# Patient Record
Sex: Female | Born: 1935 | Race: White | Hispanic: No | Smoking: Former smoker
Health system: Southern US, Community
[De-identification: ages and names within clinical notes are randomized; demographics above are authoritative.]

## PROBLEM LIST (undated history)

## (undated) DIAGNOSIS — M1990 Unspecified osteoarthritis, unspecified site: Secondary | ICD-10-CM

## (undated) DIAGNOSIS — Z87448 Personal history of other diseases of urinary system: Secondary | ICD-10-CM

## (undated) DIAGNOSIS — F419 Anxiety disorder, unspecified: Secondary | ICD-10-CM

## (undated) DIAGNOSIS — Z8739 Personal history of other diseases of the musculoskeletal system and connective tissue: Secondary | ICD-10-CM

## (undated) DIAGNOSIS — K5730 Diverticulosis of large intestine without perforation or abscess without bleeding: Secondary | ICD-10-CM

## (undated) DIAGNOSIS — I472 Ventricular tachycardia: Secondary | ICD-10-CM

## (undated) DIAGNOSIS — I480 Paroxysmal atrial fibrillation: Secondary | ICD-10-CM

## (undated) DIAGNOSIS — M810 Age-related osteoporosis without current pathological fracture: Secondary | ICD-10-CM

## (undated) DIAGNOSIS — E039 Hypothyroidism, unspecified: Secondary | ICD-10-CM

## (undated) DIAGNOSIS — Z860100 Personal history of colon polyps, unspecified: Secondary | ICD-10-CM

## (undated) DIAGNOSIS — C801 Malignant (primary) neoplasm, unspecified: Secondary | ICD-10-CM

## (undated) DIAGNOSIS — H3530 Unspecified macular degeneration: Secondary | ICD-10-CM

## (undated) DIAGNOSIS — I4729 Other ventricular tachycardia: Secondary | ICD-10-CM

## (undated) DIAGNOSIS — E785 Hyperlipidemia, unspecified: Secondary | ICD-10-CM

## (undated) DIAGNOSIS — R7303 Prediabetes: Secondary | ICD-10-CM

## (undated) DIAGNOSIS — I10 Essential (primary) hypertension: Secondary | ICD-10-CM

## (undated) DIAGNOSIS — R413 Other amnesia: Secondary | ICD-10-CM

## (undated) HISTORY — DX: Unspecified osteoarthritis, unspecified site: M19.90

## (undated) HISTORY — DX: Other amnesia: R41.3

## (undated) HISTORY — DX: Other ventricular tachycardia: I47.29

## (undated) HISTORY — DX: Age-related osteoporosis without current pathological fracture: M81.0

## (undated) HISTORY — DX: Paroxysmal atrial fibrillation: I48.0

## (undated) HISTORY — DX: Hypothyroidism, unspecified: E03.9

## (undated) HISTORY — DX: Hyperlipidemia, unspecified: E78.5

## (undated) HISTORY — DX: Diverticulosis of large intestine without perforation or abscess without bleeding: K57.30

## (undated) HISTORY — PX: CATARACT EXTRACTION W/PHACO: SHX586

## (undated) HISTORY — DX: Personal history of colonic polyps: Z86.010

## (undated) HISTORY — DX: Personal history of colon polyps, unspecified: Z86.0100

## (undated) HISTORY — DX: Ventricular tachycardia: I47.2

## (undated) HISTORY — DX: Prediabetes: R73.03

## (undated) HISTORY — DX: Personal history of other diseases of the musculoskeletal system and connective tissue: Z87.39

## (undated) HISTORY — DX: Unspecified macular degeneration: H35.30

## (undated) HISTORY — DX: Essential (primary) hypertension: I10

## (undated) HISTORY — DX: Anxiety disorder, unspecified: F41.9

## (undated) HISTORY — DX: Personal history of other diseases of urinary system: Z87.448

---
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
## 1988-07-10 HISTORY — PX: THYROIDECTOMY: SHX17

## 1998-09-15 ENCOUNTER — Other Ambulatory Visit: Admission: RE | Admit: 1998-09-15 | Discharge: 1998-09-15 | Payer: Self-pay | Admitting: Obstetrics & Gynecology

## 1999-08-19 ENCOUNTER — Other Ambulatory Visit: Admission: RE | Admit: 1999-08-19 | Discharge: 1999-08-19 | Payer: Self-pay | Admitting: Obstetrics & Gynecology

## 2000-05-16 ENCOUNTER — Other Ambulatory Visit: Admission: RE | Admit: 2000-05-16 | Discharge: 2000-05-16 | Payer: Self-pay | Admitting: Internal Medicine

## 2000-05-16 ENCOUNTER — Encounter (INDEPENDENT_AMBULATORY_CARE_PROVIDER_SITE_OTHER): Payer: Self-pay | Admitting: Specialist

## 2000-10-19 ENCOUNTER — Other Ambulatory Visit: Admission: RE | Admit: 2000-10-19 | Discharge: 2000-10-19 | Payer: Self-pay | Admitting: Obstetrics & Gynecology

## 2001-02-07 ENCOUNTER — Ambulatory Visit (HOSPITAL_COMMUNITY): Admission: RE | Admit: 2001-02-07 | Discharge: 2001-02-07 | Payer: Self-pay | Admitting: Pulmonary Disease

## 2001-02-07 ENCOUNTER — Encounter: Payer: Self-pay | Admitting: Pulmonary Disease

## 2001-12-03 ENCOUNTER — Other Ambulatory Visit: Admission: RE | Admit: 2001-12-03 | Discharge: 2001-12-03 | Payer: Self-pay | Admitting: Obstetrics & Gynecology

## 2001-12-29 ENCOUNTER — Emergency Department (HOSPITAL_COMMUNITY): Admission: EM | Admit: 2001-12-29 | Discharge: 2001-12-29 | Payer: Self-pay | Admitting: Emergency Medicine

## 2002-11-07 ENCOUNTER — Ambulatory Visit (HOSPITAL_COMMUNITY): Admission: RE | Admit: 2002-11-07 | Discharge: 2002-11-07 | Payer: Self-pay | Admitting: Pulmonary Disease

## 2002-11-07 ENCOUNTER — Encounter: Payer: Self-pay | Admitting: Pulmonary Disease

## 2003-03-26 ENCOUNTER — Other Ambulatory Visit: Admission: RE | Admit: 2003-03-26 | Discharge: 2003-03-26 | Payer: Self-pay | Admitting: Obstetrics and Gynecology

## 2005-04-28 ENCOUNTER — Other Ambulatory Visit: Admission: RE | Admit: 2005-04-28 | Discharge: 2005-04-28 | Payer: Self-pay | Admitting: Obstetrics and Gynecology

## 2005-08-30 ENCOUNTER — Ambulatory Visit: Payer: Self-pay | Admitting: Pulmonary Disease

## 2005-08-31 ENCOUNTER — Ambulatory Visit: Payer: Self-pay | Admitting: Pulmonary Disease

## 2005-08-31 ENCOUNTER — Ambulatory Visit: Payer: Self-pay | Admitting: Internal Medicine

## 2006-08-14 ENCOUNTER — Ambulatory Visit: Payer: Self-pay | Admitting: Pulmonary Disease

## 2006-09-03 ENCOUNTER — Encounter: Admission: RE | Admit: 2006-09-03 | Discharge: 2006-09-03 | Payer: Self-pay | Admitting: Orthopedic Surgery

## 2006-09-04 ENCOUNTER — Ambulatory Visit: Payer: Self-pay | Admitting: Internal Medicine

## 2006-09-18 ENCOUNTER — Encounter (INDEPENDENT_AMBULATORY_CARE_PROVIDER_SITE_OTHER): Payer: Self-pay | Admitting: Specialist

## 2006-09-18 ENCOUNTER — Ambulatory Visit: Payer: Self-pay | Admitting: Internal Medicine

## 2006-09-20 ENCOUNTER — Ambulatory Visit: Payer: Self-pay | Admitting: Internal Medicine

## 2006-09-20 LAB — CONVERTED CEMR LAB
Bacteria, UA: NEGATIVE
Basophils Relative: 0 % (ref 0.0–1.0)
Crystals: NEGATIVE
Eosinophils Relative: 1.3 % (ref 0.0–5.0)
Ketones, ur: NEGATIVE mg/dL
Lymphocytes Relative: 23.6 % (ref 12.0–46.0)
Monocytes Relative: 6.4 % (ref 3.0–11.0)
Platelets: 251 10*3/uL (ref 150–400)
RBC: 4.8 M/uL (ref 3.87–5.11)
RDW: 12.1 % (ref 11.5–14.6)
Specific Gravity, Urine: >=1.03 (ref 1.000–1.03)
Urine Glucose: NEGATIVE mg/dL
Urobilinogen, UA: 0.2 (ref 0.0–1.0)
WBC: 12 10*3/uL — ABNORMAL HIGH (ref 4.5–10.5)

## 2006-09-27 ENCOUNTER — Ambulatory Visit: Payer: Self-pay | Admitting: Internal Medicine

## 2006-09-27 LAB — CONVERTED CEMR LAB
Basophils Relative: 0.1 % (ref 0.0–1.0)
Crystals: NEGATIVE
Eosinophils Absolute: 0.2 10*3/uL (ref 0.0–0.6)
Eosinophils Relative: 2.8 % (ref 0.0–5.0)
HCT: 43 % (ref 36.0–46.0)
Lymphocytes Relative: 37.5 % (ref 12.0–46.0)
MCV: 89.8 fL (ref 78.0–100.0)
Neutrophils Relative %: 53.3 % (ref 43.0–77.0)
Platelets: 262 10*3/uL (ref 150–400)
RBC: 4.78 M/uL (ref 3.87–5.11)
Total Protein, Urine: NEGATIVE mg/dL
WBC: 8.7 10*3/uL (ref 4.5–10.5)

## 2007-08-08 ENCOUNTER — Encounter: Payer: Self-pay | Admitting: Pulmonary Disease

## 2007-08-08 DIAGNOSIS — E039 Hypothyroidism, unspecified: Secondary | ICD-10-CM | POA: Insufficient documentation

## 2007-08-08 DIAGNOSIS — I10 Essential (primary) hypertension: Secondary | ICD-10-CM | POA: Insufficient documentation

## 2007-08-08 DIAGNOSIS — E785 Hyperlipidemia, unspecified: Secondary | ICD-10-CM | POA: Insufficient documentation

## 2007-09-27 ENCOUNTER — Ambulatory Visit: Payer: Self-pay | Admitting: Pulmonary Disease

## 2007-09-27 LAB — CONVERTED CEMR LAB
AST: 24 units/L (ref 0–37)
Bilirubin, Direct: 0.1 mg/dL (ref 0.0–0.3)
Chloride: 101 meq/L (ref 96–112)
Eosinophils Absolute: 0.1 10*3/uL (ref 0.0–0.6)
Eosinophils Relative: 1.1 % (ref 0.0–5.0)
GFR calc non Af Amer: 58 mL/min
Glucose, Bld: 119 mg/dL — ABNORMAL HIGH (ref 70–99)
HCT: 42.6 % (ref 36.0–46.0)
Hemoglobin: 14.5 g/dL (ref 12.0–15.0)
Lymphocytes Relative: 22.5 % (ref 12.0–46.0)
MCHC: 33.9 g/dL (ref 30.0–36.0)
MCV: 87.8 fL (ref 78.0–100.0)
Monocytes Absolute: 0.7 10*3/uL (ref 0.2–0.7)
Neutro Abs: 9.6 10*3/uL — ABNORMAL HIGH (ref 1.4–7.7)
Neutrophils Relative %: 70.2 % (ref 43.0–77.0)
Potassium: 4.4 meq/L (ref 3.5–5.1)
RBC: 4.85 M/uL (ref 3.87–5.11)
Sodium: 138 meq/L (ref 135–145)
TSH: 0.35 microintl units/mL (ref 0.35–5.50)
WBC: 13.6 10*3/uL — ABNORMAL HIGH (ref 4.5–10.5)

## 2007-10-03 ENCOUNTER — Encounter: Payer: Self-pay | Admitting: Pulmonary Disease

## 2007-10-03 ENCOUNTER — Ambulatory Visit: Payer: Self-pay | Admitting: Internal Medicine

## 2007-10-07 ENCOUNTER — Encounter: Payer: Self-pay | Admitting: Pulmonary Disease

## 2007-10-29 ENCOUNTER — Ambulatory Visit: Payer: Self-pay | Admitting: Pulmonary Disease

## 2007-11-12 ENCOUNTER — Encounter: Payer: Self-pay | Admitting: Adult Health

## 2007-11-12 ENCOUNTER — Ambulatory Visit: Payer: Self-pay | Admitting: Pulmonary Disease

## 2007-11-12 LAB — CONVERTED CEMR LAB

## 2007-11-14 LAB — CONVERTED CEMR LAB
Hemoglobin: 15.4 g/dL — ABNORMAL HIGH (ref 12.0–15.0)
Hgb A1c MFr Bld: 6.4 % — ABNORMAL HIGH (ref 4.6–6.0)
Lymphocytes Relative: 24.7 % (ref 12.0–46.0)
Monocytes Relative: 6.2 % (ref 3.0–12.0)
Mucus, UA: NEGATIVE
Nitrite: NEGATIVE
Platelets: 275 10*3/uL (ref 150–400)
RDW: 11.6 % (ref 11.5–14.6)
Squamous Epithelial / LPF: NEGATIVE /lpf
Total Protein, Urine: NEGATIVE mg/dL
Vitamin B-12: 708 pg/mL (ref 211–911)
WBC: 11 10*3/uL — ABNORMAL HIGH (ref 4.5–10.5)
pH: 5.5 (ref 5.0–8.0)

## 2007-12-12 ENCOUNTER — Ambulatory Visit: Payer: Self-pay | Admitting: Internal Medicine

## 2007-12-12 ENCOUNTER — Encounter (INDEPENDENT_AMBULATORY_CARE_PROVIDER_SITE_OTHER): Payer: Self-pay | Admitting: *Deleted

## 2007-12-12 ENCOUNTER — Encounter: Payer: Self-pay | Admitting: Adult Health

## 2007-12-13 LAB — CONVERTED CEMR LAB
ALT: 17 units/L (ref 0–35)
AST: 23 units/L (ref 0–37)
Bilirubin, Direct: 0.1 mg/dL (ref 0.0–0.3)
Total Bilirubin: 1 mg/dL (ref 0.3–1.2)
VLDL: 28 mg/dL (ref 0–40)
Vit D, 1,25-Dihydroxy: 30 (ref 30–89)

## 2008-01-17 ENCOUNTER — Telehealth (INDEPENDENT_AMBULATORY_CARE_PROVIDER_SITE_OTHER): Payer: Self-pay | Admitting: *Deleted

## 2008-01-21 ENCOUNTER — Ambulatory Visit: Payer: Self-pay | Admitting: Pulmonary Disease

## 2008-01-21 ENCOUNTER — Ambulatory Visit: Payer: Self-pay | Admitting: Internal Medicine

## 2008-02-10 ENCOUNTER — Ambulatory Visit: Payer: Self-pay | Admitting: Pulmonary Disease

## 2008-02-10 DIAGNOSIS — R7301 Impaired fasting glucose: Secondary | ICD-10-CM | POA: Insufficient documentation

## 2008-02-16 LAB — CONVERTED CEMR LAB
CO2: 31 meq/L (ref 19–32)
Chloride: 106 meq/L (ref 96–112)
Creatinine, Ser: 1 mg/dL (ref 0.4–1.2)
Hgb A1c MFr Bld: 5.9 % (ref 4.6–6.0)

## 2008-03-04 ENCOUNTER — Encounter: Payer: Self-pay | Admitting: Pulmonary Disease

## 2008-04-27 ENCOUNTER — Ambulatory Visit: Payer: Self-pay | Admitting: Pulmonary Disease

## 2008-08-12 ENCOUNTER — Ambulatory Visit: Payer: Self-pay | Admitting: Pulmonary Disease

## 2008-08-12 DIAGNOSIS — H3530 Unspecified macular degeneration: Secondary | ICD-10-CM | POA: Insufficient documentation

## 2008-08-12 DIAGNOSIS — M1990 Unspecified osteoarthritis, unspecified site: Secondary | ICD-10-CM | POA: Insufficient documentation

## 2008-08-12 LAB — CONVERTED CEMR LAB
Alkaline Phosphatase: 45 units/L (ref 39–117)
Basophils Absolute: 0 10*3/uL (ref 0.0–0.1)
Bilirubin, Direct: 0.1 mg/dL (ref 0.0–0.3)
Cholesterol: 164 mg/dL (ref 0–200)
Eosinophils Absolute: 0.3 10*3/uL (ref 0.0–0.7)
GFR calc Af Amer: 90 mL/min
GFR calc non Af Amer: 75 mL/min
HCT: 41.9 % (ref 36.0–46.0)
HDL: 53.7 mg/dL (ref >39.0)
MCHC: 35.1 g/dL (ref 30.0–36.0)
MCV: 88.9 fL (ref 78.0–100.0)
Monocytes Absolute: 0.7 10*3/uL (ref 0.1–1.0)
Platelets: 227 10*3/uL (ref 150–400)
Potassium: 4.2 meq/L (ref 3.5–5.1)
RDW: 12.6 % (ref 11.5–14.6)
Sodium: 143 meq/L (ref 135–145)
TSH: 0.54 microintl units/mL (ref 0.35–5.50)
Total CHOL/HDL Ratio: 3.1
Triglycerides: 77 mg/dL (ref 0–149)

## 2008-09-03 ENCOUNTER — Encounter: Payer: Self-pay | Admitting: Pulmonary Disease

## 2008-10-28 ENCOUNTER — Ambulatory Visit: Payer: Self-pay | Admitting: Pulmonary Disease

## 2008-11-09 ENCOUNTER — Telehealth (INDEPENDENT_AMBULATORY_CARE_PROVIDER_SITE_OTHER): Payer: Self-pay | Admitting: *Deleted

## 2008-11-09 LAB — CONVERTED CEMR LAB: TSH: 0.59 microintl units/mL (ref 0.35–5.50)

## 2009-02-08 ENCOUNTER — Ambulatory Visit: Payer: Self-pay | Admitting: Pulmonary Disease

## 2009-04-23 ENCOUNTER — Encounter: Payer: Self-pay | Admitting: Pulmonary Disease

## 2009-05-02 LAB — CONVERTED CEMR LAB
AST: 20 units/L (ref 0–37)
Albumin: 3.8 g/dL (ref 3.5–5.2)
BUN: 22 mg/dL (ref 6–23)
Basophils Absolute: 0.1 10*3/uL (ref 0.0–0.1)
CO2: 30 meq/L (ref 19–32)
Calcium: 8.8 mg/dL (ref 8.4–10.5)
Cholesterol: 138 mg/dL (ref 0–200)
Eosinophils Absolute: 0.2 10*3/uL (ref 0.0–0.7)
GFR calc non Af Amer: 74.46 mL/min (ref >60)
Glucose, Bld: 103 mg/dL — ABNORMAL HIGH (ref 70–99)
HCT: 41.4 % (ref 36.0–46.0)
HDL: 48.2 mg/dL (ref >39.00)
Hgb A1c MFr Bld: 5.9 % (ref 4.6–6.5)
Lymphs Abs: 2.6 10*3/uL (ref 0.7–4.0)
MCHC: 34.9 g/dL (ref 30.0–36.0)
Monocytes Relative: 5.6 % (ref 3.0–12.0)
Neutro Abs: 5.3 10*3/uL (ref 1.4–7.7)
Platelets: 202 10*3/uL (ref 150.0–400.0)
Potassium: 4.9 meq/L (ref 3.5–5.1)
RDW: 12.5 % (ref 11.5–14.6)
TSH: 0.6 microintl units/mL (ref 0.35–5.50)
Total Bilirubin: 0.8 mg/dL (ref 0.3–1.2)
VLDL: 10.2 mg/dL (ref 0.0–40.0)

## 2009-05-03 ENCOUNTER — Encounter: Payer: Self-pay | Admitting: Pulmonary Disease

## 2009-07-30 ENCOUNTER — Ambulatory Visit: Payer: Self-pay | Admitting: Pulmonary Disease

## 2009-08-02 ENCOUNTER — Ambulatory Visit: Payer: Self-pay | Admitting: Pulmonary Disease

## 2009-08-09 ENCOUNTER — Telehealth: Payer: Self-pay | Admitting: Pulmonary Disease

## 2009-08-12 LAB — CONVERTED CEMR LAB
ALT: 16 units/L (ref 0–35)
Albumin: 4 g/dL (ref 3.5–5.2)
BUN: 16 mg/dL (ref 6–23)
Basophils Absolute: 0.1 10*3/uL (ref 0.0–0.1)
Basophils Relative: 0.7 % (ref 0.0–3.0)
Bilirubin, Direct: 0.1 mg/dL (ref 0.0–0.3)
CO2: 31 meq/L (ref 19–32)
Calcium: 9.1 mg/dL (ref 8.4–10.5)
Chloride: 105 meq/L (ref 96–112)
Cholesterol: 155 mg/dL (ref 0–200)
Creatinine, Ser: 0.8 mg/dL (ref 0.4–1.2)
Eosinophils Absolute: 0.4 10*3/uL (ref 0.0–0.7)
Glucose, Bld: 98 mg/dL (ref 70–99)
HDL: 48.4 mg/dL (ref >39.00)
Hemoglobin: 14.3 g/dL (ref 12.0–15.0)
Lymphocytes Relative: 36.4 % (ref 12.0–46.0)
MCHC: 32.8 g/dL (ref 30.0–36.0)
Monocytes Relative: 7.2 % (ref 3.0–12.0)
Neutro Abs: 4.1 10*3/uL (ref 1.4–7.7)
Neutrophils Relative %: 51.4 % (ref 43.0–77.0)
RBC: 4.68 M/uL (ref 3.87–5.11)
Total CHOL/HDL Ratio: 3
Total Protein: 6.6 g/dL (ref 6.0–8.3)
Triglycerides: 100 mg/dL (ref 0.0–149.0)
Vit D, 25-Hydroxy: 40 ng/mL (ref 30–89)

## 2009-09-06 ENCOUNTER — Encounter: Payer: Self-pay | Admitting: Pulmonary Disease

## 2010-01-17 ENCOUNTER — Ambulatory Visit: Payer: Self-pay | Admitting: Pulmonary Disease

## 2010-01-18 LAB — CONVERTED CEMR LAB
CO2: 30 meq/L (ref 19–32)
Calcium: 9.2 mg/dL (ref 8.4–10.5)
Cholesterol: 158 mg/dL (ref 0–200)
GFR calc non Af Amer: 82.55 mL/min (ref >60)
HDL: 54.6 mg/dL (ref >39.00)
Potassium: 5.1 meq/L (ref 3.5–5.1)
Sodium: 141 meq/L (ref 135–145)
Triglycerides: 74 mg/dL (ref 0.0–149.0)

## 2010-01-19 ENCOUNTER — Ambulatory Visit: Payer: Self-pay | Admitting: Internal Medicine

## 2010-01-19 ENCOUNTER — Encounter: Payer: Self-pay | Admitting: Pulmonary Disease

## 2010-05-02 ENCOUNTER — Encounter: Payer: Self-pay | Admitting: Pulmonary Disease

## 2010-05-03 ENCOUNTER — Encounter: Payer: Self-pay | Admitting: Pulmonary Disease

## 2010-05-13 ENCOUNTER — Encounter: Payer: Self-pay | Admitting: Pulmonary Disease

## 2010-07-19 ENCOUNTER — Ambulatory Visit
Admission: RE | Admit: 2010-07-19 | Discharge: 2010-07-19 | Payer: Self-pay | Source: Home / Self Care | Attending: Pulmonary Disease | Admitting: Pulmonary Disease

## 2010-07-19 ENCOUNTER — Other Ambulatory Visit: Payer: Self-pay | Admitting: Pulmonary Disease

## 2010-07-19 LAB — CBC WITH DIFFERENTIAL/PLATELET
Basophils Absolute: 0.1 10*3/uL (ref 0.0–0.1)
Basophils Relative: 0.8 % (ref 0.0–3.0)
Eosinophils Absolute: 0.2 10*3/uL (ref 0.0–0.7)
Eosinophils Relative: 2.3 % (ref 0.0–5.0)
HCT: 41.6 % (ref 36.0–46.0)
Hemoglobin: 14.2 g/dL (ref 12.0–15.0)
Lymphocytes Relative: 26.5 % (ref 12.0–46.0)
Lymphs Abs: 2.3 10*3/uL (ref 0.7–4.0)
MCHC: 34.2 g/dL (ref 30.0–36.0)
MCV: 90.1 fl (ref 78.0–100.0)
Monocytes Absolute: 0.7 10*3/uL (ref 0.1–1.0)
Monocytes Relative: 7.6 % (ref 3.0–12.0)
Neutro Abs: 5.4 10*3/uL (ref 1.4–7.7)
Neutrophils Relative %: 62.8 % (ref 43.0–77.0)
Platelets: 214 10*3/uL (ref 150.0–400.0)
RBC: 4.62 Mil/uL (ref 3.87–5.11)
RDW: 13.1 % (ref 11.5–14.6)
WBC: 8.6 10*3/uL (ref 4.5–10.5)

## 2010-07-19 LAB — HEPATIC FUNCTION PANEL
ALT: 16 U/L (ref 0–35)
AST: 19 U/L (ref 0–37)
Albumin: 3.9 g/dL (ref 3.5–5.2)
Alkaline Phosphatase: 54 U/L (ref 39–117)
Bilirubin, Direct: 0.1 mg/dL (ref 0.0–0.3)
Total Bilirubin: 0.8 mg/dL (ref 0.3–1.2)
Total Protein: 6.8 g/dL (ref 6.0–8.3)

## 2010-07-19 LAB — BASIC METABOLIC PANEL
BUN: 22 mg/dL (ref 6–23)
CO2: 30 mEq/L (ref 19–32)
Calcium: 9.3 mg/dL (ref 8.4–10.5)
Chloride: 107 mEq/L (ref 96–112)
Creatinine, Ser: 0.8 mg/dL (ref 0.4–1.2)
GFR: 75.25 mL/min (ref >60.00)
Glucose, Bld: 95 mg/dL (ref 70–99)
Potassium: 4.8 mEq/L (ref 3.5–5.1)
Sodium: 143 mEq/L (ref 135–145)

## 2010-07-19 LAB — LIPID PANEL
Cholesterol: 155 mg/dL (ref 0–200)
HDL: 50.1 mg/dL (ref >39.00)
LDL Cholesterol: 85 mg/dL (ref 0–99)
Total CHOL/HDL Ratio: 3
Triglycerides: 101 mg/dL (ref 0.0–149.0)
VLDL: 20.2 mg/dL (ref 0.0–40.0)

## 2010-07-19 LAB — TSH: TSH: 0.6 u[IU]/mL (ref 0.35–5.50)

## 2010-08-09 NOTE — Consult Note (Signed)
Summary: [facility]   Imported By: Sherian Rein 08/28/2009 08:25:03  _____________________________________________________________________  External Attachment:    Type:   Image     Comment:   External Document

## 2010-08-09 NOTE — Progress Notes (Signed)
Summary: results  Phone Note Call from Patient Call back at Home Phone [phone]   Caller: Patient Call For: Keighley Deckman Summary of Call: lab results.  Initial call taken by: Tivis Ringer, CNA,  August 09, 2009 12:36 PM  Follow-up for Phone Call        called and lmom with pt and she is aware that SN is out of the office today and will return tomorrow.  will call her back with her results tomorrow.  Randell Loop [facility]  August 09, 2009 1:41 PM   Additional Follow-up for Phone Call Additional follow up Details #1::        called pt back about lab reuslts---per SN--labs WNL  rec same meds and to add vit d 1000units daily.  pt voiced her understanding of this. Randell Loop CMA  August 10, 2009 10:10 AM

## 2010-08-09 NOTE — Miscellaneous (Signed)
Summary: flu vaccine given at [facility]  Clinical Lists Changes  Observations: Added new observation of FLU VAX: Historical (05/02/2010 8:44)      Immunization History:  Influenza Immunization History:    Influenza:  historical (05/02/2010)

## 2010-08-09 NOTE — Letter (Signed)
Summary: [facility]  [facility]   Imported By: Sherian Rein 05/21/2010 11:21:20  _____________________________________________________________________  External Attachment:    Type:   Image     Comment:   External Document

## 2010-08-09 NOTE — Miscellaneous (Signed)
Summary: BONE DENSITY  Clinical Lists Changes  Orders: Added new Test order of T-Bone Densitometry (77080) - Signed Added new Test order of T-Lumbar Vertebral Assessment (77082) - Signed 

## 2010-08-09 NOTE — Assessment & Plan Note (Signed)
Summary: rov/jd   Primary Care Provider:  Kriste Basque  CC:  6 month ROV & review of mult medical problems....  History of Present Illness: 77 y/o WF here for a follow up visit... she has mult med problems as noted below ...    ~  sees DrPerry for GI- divertics, polyps, hems & a bout of diverticulitis in Mar08...   ~  sees Dr [name] Caprice Urology w/ microscopic hematuria and a neg eval 5/08...  ~  sees Film/video editor for Ophthal w/ macular degen- 8/09 note reviewed...  ~  sees DrLLomax for Derm w/ alopecia- offered Rogaine but decided against it...   ~  July 30, 2009:  she is still concerned our "lump" in her right side/ flank area= mod sized lipoma w/ prev check by [facility], DrLeone w/ rec for no surg... she is anxious about it & we discussed getting a second opinion about excision from [facility] to compare notes (saw DrCornett who offered surg if she wants)... BP remains controlled on meds, Chol has been good on the Simva20, & TFT's normal off the prev Synthroid Rx...   ~  January 17, 2010:  she increased her Lisinopril from 20mg  to 30mg  on her own when home BP checks inched up above 150sys... she notes some heel pain & we discussed soaks & stretching exercises... she remains on diet & exerc program- weight down 4# & due for f/u labs...    Current Problem List:  MACULAR DEGENERATION (ICD-362.50) - eval by DrHecker w/ foveal telangiectasia, macular drusen, macular degeneration, and cataracts- being followed regularly... vision is preserved so far...  ~  10/10:  f/u DrHecker- stable, no retinopathy.  HYPERTENSION (ICD-401.9) - on VERAPAMIL 240mg /d,  LISINOPRIL 20mg - she incr to 1.5tabs/d, and diet therapy... BP= 124/80 today... she feels well and takes meds regularly... denies HA, fatigue, visual changes, CP, palipit, dizziness, syncope, dyspnea, edema, etc... prev Rx'd for swelling w/ Dyazide but she got on a low sodium diet & was able to DC the diuretic...  ~  8/10: borderline BP here and she will monitor at home  carefully & call if not <150/90.  ~  1/11:  BP improved  ~130/84... same meds.  ~  7/11: she reports that she incr the Lisinopril to 1&1/2 tabs due to BP ~150+, now better...  DYSLIPIDEMIA (ICD-272.4) - on ZOCOR 20mg /d... plus doing well on diet...  ~  FLP 6/09 showed TChol 144, TG 140, HDL 39, LDL 77... continue same.  ~  FLP 2/10 showed TChol 164, TG 77, HDL 54, LDL 95  ~  FLP 8/10 showed TChol 138, TG 51, HDL 48, LDL 80  ~  FLP 1/11 showed TChol 155, TG 100, HDL 48, LDL 87  ~  FLP 7/11 showed TChol 158, TG 74, HDL 55, LDL 89  DIABETES MELLITUS, BORDERLINE (ICD-790.29) - on diet alone... FBS @ home betw 120-130 recently... she is on a good diet and weight is down at 139# today (63"  BMI= 24)...  ~  labs 3/09 showed FBS= 119... subseq HgA1c 5/09 was 6.4... diet Rx.  ~  labs 8/09 showed BS= 106, HgA1c= 5.9.Marland KitchenMarland Kitchen rec- same.  ~  labs 2/10 showed BS= 102, A1c= 5.9  ~  labs 8/10 showed BS= 103, A1c= 5.9  ~  labs 1/11 showed BS= 98  ~  labs 7/11 showed BS= 89, A1c= 6.0  HYPOTHYROIDISM (ICD-244.9) - off SYNTHROID now...  clinically & biochemically euthyroid... remote hx of overactive thyroid many yrs ago in [place] (we don't have  records)... she had an eval by DrKohut in 1987 w/ familial multinodular goiter w/ neg FNA... started on Rx but goiter enlarged- had thyroidectomy 1990 by DrWeatherly...  ~  labs 3/09 showed TSH = 0.35 (0.35-5.5)... we have slowly decr her dose to 4mcg/d...  ~  labs 8/09 showed TSH= 0.33... rec- decr the Synth50 to 1/2 tab daily.  ~  labs 2/10 showed =TSH= 0.54 and Synthroid stopped...  ~  labs 4/10 off med showed TSH= 0.59  ~  labs 8/10 showed TSH= 0.60  ~  labs 1/11 showed TSH= 0.60  ~  labs 7/11 showed TSH= 0.56  DIVERTICULOSIS OF COLON (ICD-562.10) COLONIC POLYPS (ICD-211.3) - followed by DrPerry... bout of diverticulitis in Mar08...  ~  last colonoscopy 3/08 showed divertics, 2mm polyp, hems... path=hyperplastic, f/u planned 32yrs.  HEMATURIA, HX OF (ICD-V13.09) - eval  4/08 by DrGrapey w/ neg Sonar, Cysto, cytology...  DEGENERATIVE JOINT DISEASE (ICD-715.90) - c/o discomfort in her shoulders and knees... uses OTC meds as needed.  Hx of OSTEOPOROSIS (ICD-733.00) - on Fosamax 70/wk thru 7/11, Caltrate 1/d, MVI, Vit D 1000/d...  ~  initial BMD 10/01 showed TScores -0.2  to -3.2 in the spine...   ~  f/u studies w/ steady improvement on Fosamax therapy...  ~  BMD 3/09 w/ TScores -0.1 to -1.7 in the spine... continue Rx.  ~  f/u BMD 7/11 = pending... we decided to stop the Fosamax for a drug holiday, continue other meds.  ? of MEMORY LOSS (ICD-780.93) - MMSE 5/09 by Shanon Brow, NP was 29/30... reassured.  ANXIETY (ICD-300.00) - prev on Alpraz Prn but hasn't used in yrs...  LIPOMA (ICD-214.9) - notes large lipoma in RUQ/ rt flank area... some discomfort noted... seen by DrLeone in the past w/o surg... she saw DrCornett, [facility], 1/11 for second opinion & she will decide re: excision...  SHINGLES - remote hx of right T8-9 shingles in 1986...   Preventive Screening-Counseling & Management  Alcohol-Tobacco     Smoking Status: never  Allergies: 1)  ! Epinephrine  Comments:  Nurse/Medical Assistant: The patient's medications and allergies were reviewed with the patient and were updated in the Medication and Allergy Lists.  Past History:  Past Medical History: MACULAR DEGENERATION (ICD-362.50) HYPERTENSION (ICD-401.9) DYSLIPIDEMIA (ICD-272.4) DIABETES MELLITUS, BORDERLINE (ICD-790.29) HYPOTHYROIDISM (ICD-244.9) DIVERTICULOSIS OF COLON (ICD-562.10) COLONIC POLYPS (ICD-211.3) HEMATURIA, HX OF (ICD-V13.09) DEGENERATIVE JOINT DISEASE (ICD-715.90) Hx of OSTEOPOROSIS (ICD-733.00) ? of MEMORY LOSS (ICD-780.93) ANXIETY (ICD-300.00) LIPOMA (ICD-214.9)  Past Surgical History: S/P thyroidectomy 1990 by DrWeatherly  Family History: Reviewed history from 02/10/2008 and no changes required. mother deceased at age 53--stroke father deceased at 80 in the 2nd  World War sibling deceased at 43 with MI--brother  Social History: Reviewed history from 02/10/2008 and no changes required. 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    Vital Signs:  Patient profile:   77 year old female Height:      63 inches Weight:      138.38 pounds BMI:     24.60 O2 Sat:      98 % on Room air Temp:     97.1 degrees  F oral Pulse rate:   66 / minute BP sitting:   124 / 80  (left arm) Cuff size:   regular  Vitals Entered By: Randell Loop CMA (January 17, 2010 11:30 AM)  O2 Sat at Rest %:  98 O2 Flow:  Room air CC: 6 month ROV & review of mult medical problems... Is Patient Diabetic? No Pain Assessment Patient in pain? yes      Onset of pain  left heel pain Comments meds updated today with pt today   Physical Exam  Additional Exam:  WD, WN, 77 y/o WF in NAD... GENERAL:  Alert & oriented; pleasant & cooperative... HEENT:  [place]/AT, EOM-wnl, PERRLA, EACs-clear, TMs-wnl, NOSE-clear, THROAT-clear & wnl. NECK:  Supple w/ fair ROM; no JVD; normal carotid impulses w/o bruits; scar of prev thyroid surg- no nodules or goiter; no lymphadenopathy. CHEST:  Clear to P & A; without wheezes/ rales/ or rhonchi. HEART:  Regular Rhythm; without murmurs/ rubs/ or gallops. ABDOMEN:  Soft & nontender; normal bowel sounds; no organomegaly or masses detected. EXT: without deformities, mild arthritic changes; no varicose veins/ venous insuffic/ or edema. prob heel spurs w/ some  plantar fascia pain> advised soaks & stretching... NEURO:  CN's intact;  no focal neuro deficits... DERM:  Lipoma in RUQ/ right flank area... no other lesions noted...    MISC. Report  Procedure date:  01/17/2010  Findings:      BMP (METABOL)   Sodium                    141 mEq/L                   135-145   Potassium                 5.1 mEq/L                   3.5-5.1   Chloride                  106 mEq/L                   96-112   Carbon Dioxide            30 mEq/L                    19-32   Glucose                   89 mg/dL                    16-10   BUN                       21 mg/dL                    9-60   Creatinine                0.7 mg/dL                   4.5-4.0   Calcium                   9.2 mg/dL                   9.8-11.9   GFR  82.55 mL/min                >60  Hemoglobin A1C (A1C)   Hemoglobin A1C            6.0 %                       4.6-6.5  Lipid Panel (LIPID)   Cholesterol               158 mg/dL                   7-253   Triglycerides             74.0 mg/dL                  6.6-440.3   HDL                       47.42 mg/dL                 >59.56   LDL Cholesterol           89 mg/dL                    3-87     TSH (TSH)   FastTSH                   0.56 uIU/mL                 0.35-5.50   Impression & Recommendations:  Problem # 1:  HYPERTENSION (ICD-401.9) Controlled on current meds> she incr her Lisinopril to 30mg  /d on her own & BP looks well controlled... continue same meds. Her updated medication list for this problem includes:    Verapamil Hcl Cr 240 Mg Cp24 (Verapamil hcl) .[name]... Take 1 capsule by mouth once a day    Lisinopril 20 Mg Tabs (Lisinopril) .[name]... Take as directed...  Orders: TLB-BMP (Basic Metabolic Panel-BMET) (80048-METABOL) TLB-A1C / Hgb A1C (Glycohemoglobin) (83036-A1C) TLB-Lipid Panel (80061-LIPID) TLB-TSH (Thyroid Stimulating Hormone) (84443-TSH)  Problem # 2:  DYSLIPIDEMIA (ICD-272.4) On Simva20 & labs  reasonably well controlled... continue same + diet Rx... Her updated medication list for this problem includes:    Zocor 20 Mg Tabs (Simvastatin) .[name]... Take 1 tab by mouth at bedtime...  Problem # 3:  DIABETES MELLITUS, BORDERLINE (ICD-790.29) Remains well controlled on diet alone...   Problem # 4:  HYPOTHYROIDISM (ICD-244.9) Clinically & biochem euthyroid off meds...  Problem # 5:  Hx of OSTEOPOROSIS (ICD-733.00) She is due for f/u BMD> pending... we discussed stopping the Bisphos Rx for Drug holiday starting now... The following medications were removed from the medication list:    Fosamax 70 Mg Tabs (Alendronate sodium) .[name]... Take 1 tablet by mouth once a week  Problem # 6:  Other medical problems as noted...  Complete Medication List: 1)  Bayer Aspirin Ec Low Dose 81 Mg Tbec (Aspirin) .... Take 1 tablet by mouth once a day 2)  Verapamil Hcl Cr 240 Mg Cp24 (Verapamil hcl) .... Take 1 capsule by mouth once a day 3)  Lisinopril 20 Mg Tabs (Lisinopril) .... Take as directed... 4)  Zocor 20 Mg Tabs (Simvastatin) .... Take 1 tab by mouth at bedtime.Marland KitchenMarland Kitchen 5)  Calcium Carbonate-vitamin D 600-400 Mg-unit Tabs (Calcium carbonate-vitamin d) .... Take 1 tablet by mouth once a day 6)  Multivitamins Tabs (Multiple vitamin) .... Take 1 tablet by mouth once a  day 7)  Vitamin D 1000 Unit Caps (Cholecalciferol) .... Take 1 cap by mouth once daily.Marland KitchenMarland Kitchen 8)  Nova Max Glucose Test Strp (Glucose blood) .... Use as directed 9)  Nova Sureflex Lancets Misc (Lancets) .... Use as directed to check blood sugars  Patient Instructions: 1)  Today we updated your med list- see below.... 2)  We refilled your meds today & we discussed stopping the FOSAMAX for a drug Holiday... 3)  We will arrange for a f/u BMD due now, & plan to stop the Fosamax for the next 2 yrs... 4)  Call for any problems.Marland KitchenMarland Kitchen 5)  Please schedule a follow-up appointment in 6 months. Prescriptions: ZOCOR 20 MG  TABS (SIMVASTATIN) Take 1 tab by mouth  at bedtime...  #90 x 4   Entered and Authorized by:   Michele Mcalpine MD   Signed by:   Michele Mcalpine MD on 01/17/2010   Method used:   Print then Give to Patient   RxID:   [id number] LISINOPRIL 20 MG  TABS (LISINOPRIL) Take as directed...  #90 x 4   Entered and Authorized by:   Michele Mcalpine MD   Signed by:   Michele Mcalpine MD on 01/17/2010   Method used:   Print then Give to Patient   RxID:   [id number] VERAPAMIL HCL CR 240 MG  CP24 (VERAPAMIL HCL) Take 1 capsule by mouth once a day  #90 x 4   Entered and Authorized by:   Michele Mcalpine MD   Signed by:   Michele Mcalpine MD on 01/17/2010   Method used:   Print then Give to Patient   RxID:   [id number]

## 2010-08-09 NOTE — Assessment & Plan Note (Signed)
Summary: 4-6 months rov///kp   PCP:  Kriste Basque  Chief Complaint:  4 month ROv & f/u of mult med issues....  History of Present Illness: 77 y/o WF here for a follow up visit... she has mult med problems as noted below ...   ~  sees DrPerry for GI- divertics, polyps, hems & a bout of diverticulitis in Mar08...   ~  sees Dr [name] Caprice Urology w/ microscopic hematuria and a neg eval 5/08...  ~  sees Film/video editor for Ophthal w/ macular degen- 8/09 note reviewed...  ~  sees DrLLomax for Derm w/ alopecia- offered Rogaine but decided against it... she wonders about her Synthroid medication (something she read)- we have been decr the dose & may be able to stop.    Current Problem List:  MACULAR DEGENERATION (ICD-362.50) - eval by DrHecker w/ foveal telangiectasia, macular drusen, macular degeneration, and cataracts- being followed regularly... vision is preserved so far...  HYPERTENSION (ICD-401.9) - Stable on VERAPAMIL 240mg /d,  LISINOPRIL 20mg /d, and diet therapy... BP= 136/80 today... she feels well and takes meds regularly... denies HA, fatigue, visual changes, CP, palipit, dizziness, syncope, dyspnea, edema, etc... prev Rx'd for swelling w/ Dyazide but she got on a low sodium diet & was able to DC the diuretic...  DYSLIPIDEMIA (ICD-272.4) - on ZOCOR 20mg /d... plus doing well on diet...  ~  FLP 6/09 showed TChol 144, TG 140, HDL 39, LDL 77... continue same.  ~  FLP 2/10 showed =   DIABETES MELLITUS, BORDERLINE (ICD-790.29) - on diet alone... BS @ home betw 110-160... she is on a good diet and weight is down 140# today...  ~  labs 3/09 showed FBS= 119... subseq HgA1c 5/09 was 6.4... diet Rx-  ~  labs 8/09 showed BS= 106, HgA1c= 5.9.Marland KitchenMarland Kitchen rec- same.  ~  labs 2/10 showed =   HYPOTHYROIDISM (ICD-244.9) - on SYNTHROID Rx (currently - 1/2 tab daily)...  remote hx of overactive thyroid many yrs ago in [place] (we don't have records)... she had an eval by DrKohut in 1987 w/ familial multinodular goiter w/ neg  FNA... started on Rx but goiter enlarged- had thyroidectomy 1990 by DrWeatherly...  ~  labs 3/09 showed TSH = 0.35 (0.35-5.5)... we have slowly decr her dose to 56mcg/d...  ~  labs 8/09 showed TSH= 0.33... rec- decr the Synth50 to 1/2 tab daily.  ~  labs 2/10 showed =   DIVERTICULOSIS OF COLON (ICD-562.10) COLONIC POLYPS (ICD-211.3) - followed by DrPerry... bout of diverticulitis in Mar08...  ~  last colonoscopy 3/08 showed divertics, 2mm polyp, hems... path=hyperplastic, f/u planned 64yrs.  HEMATURIA, HX OF (ICD-V13.09) - eval 4/08 by DrGrapey w/ neg Sonar, Cysto, cytology...  DEGENERATIVE JOINT DISEASE (ICD-715.90) - c/o discomfort in her shoulders and knees... uses OTC meds as needed.  Hx of OSTEOPOROSIS (ICD-733.00) - hx osteoporosis on BMD testing here back to 2001...   ~  initial BMD 10/01 showed TScores -0.2  to -3.2 in the spine...   ~  f/u studies w/ steady improvement on Fosamax therapy...  ~  recent BMD 3/09 w/ TScores -0.1 to -1.7 in the spine... continue Rx.  ? of MEMORY LOSS (ICD-780.93) - MMSE 5/09 by Shanon Brow, NP was 29/30... reassured.  ANXIETY (ICD-300.00) - she has ALPRAZOLAM 0.5mg - 1/2 - 1 tab Tid Prn...   LIPOMA (ICD-214.9) - notes large lipoma in RUQ/ rt flank area... some discomfort noted... seen by DrLeone in the past w/o surg... offered gen surg f/u whenever she is ready... SHINGLES - remote hx of right  T8-9 shingles in 1986...     Current Allergies (reviewed today): ! EPINEPHRINE Past Medical History:        MACULAR DEGENERATION (ICD-362.50)    HYPERTENSION (ICD-401.9)    DYSLIPIDEMIA (ICD-272.4)    DIABETES MELLITUS, BORDERLINE (ICD-790.29)    HYPOTHYROIDISM (ICD-244.9)    DIVERTICULOSIS OF COLON (ICD-562.10)    COLONIC POLYPS (ICD-211.3)    HEMATURIA, HX OF (ICD-V13.09)    DEGENERATIVE JOINT DISEASE (ICD-715.90)    Hx of OSTEOPOROSIS (ICD-733.00)    ? 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         she notes alopecia and has already seen DrLLomax...  Vital Signs:  Patient Profile:   77 Years Old Female Weight:      142.19 pounds O2 Sat:      98 % O2 treatment:    Room Air Temp:     97.6 degrees F oral Pulse rate:   65 / minute BP sitting:   136 / 80  (left arm) Cuff size:   regular  Vitals Entered By: Marijo File CMA (August 12, 2008 9:43 AM)             Comments PT STATED NO CHANGES IN HER MEDS SINCE LAST OV    Physical Exam  WD, WN, 77 y/o WF in NAD... GENERAL:  Alert & oriented; pleasant & cooperative... HEENT:  [place]/AT, EOM-wnl, PERRLA, EACs-clear, TMs-wnl, NOSE-clear, THROAT-clear & wnl. NECK:  Supple w/ fair ROM; no JVD; normal carotid impulses w/o bruits; scar of prev thyroid surg- no nodules or goiter; no lymphadenopathy. CHEST:  Clear to P & A; without wheezes/ rales/ or rhonchi. HEART:  Regular Rhythm; without murmurs/ rubs/ or gallops. ABDOMEN:  Soft & nontender; normal bowel sounds; no organomegaly or masses  detected. EXT: without deformities, mild arthritic changes; no varicose veins/ venous insuffic/ or edema. NEURO:  CN's intact;  no focal neuro deficits... DERM:  Lipoma in RUQ/ rt flank area... no other lesions...   Impression & Recommendations:  Problem # 1:  HYPERTENSION (ICD-401.9) Controlled-  same meds. Her updated medication list for this problem includes:    Verapamil Hcl Cr 240 Mg Cp24 (Verapamil hcl) .[name]... Take 1 capsule by mouth once a day    Lisinopril 20 Mg Tabs (Lisinopril) .[name]... Take 1 tablet by mouth once a day  Orders: Venipuncture (60454) TLB-Lipid Panel (80061-LIPID) TLB-BMP (Basic Metabolic Panel-BMET) (80048-METABOL) TLB-CBC Platelet -  w/Differential (85025-CBCD) TLB-Hepatic/Liver Function Pnl (80076-HEPATIC) TLB-TSH (Thyroid Stimulating Hormone) (84443-TSH) TLB-A1C / Hgb A1C (Glycohemoglobin) (83036-A1C)   Problem # 2:  DYSLIPIDEMIA (ICD-272.4) Due for f/u FLP...  Her updated medication list for this problem includes:    Zocor 20 Mg Tabs (Simvastatin) .[name]... Take 1 tab by mouth at bedtime   Problem # 3:  DIABETES MELLITUS, BORDERLINE (ICD-790.29) Doing satis on diet-  f/u labs today.  Problem # 4:  HYPOTHYROIDISM (ICD-244.9) She is hoping to wean off the Synthroid when TSH returns... Her updated medication list for this problem includes:    Levothroid 50 Mcg Tabs (Levothyroxine sodium) .[name]... Take 1/2  tablet by mouth once a day   Problem # 5:  COLONIC POLYPS (ICD-211.3) GI is stable and up to date...  Problem # 6:  Hx of OSTEOPOROSIS (ICD-733.00) Stable-  continue meds... Her updated medication list for this problem includes:    Fosamax 70 Mg Tabs (Alendronate sodium) .[name]... Take 1 tablet by mouth once a week    Calcium Carbonate-vitamin D 600-400 Mg-unit Tabs (Calcium carbonate-vitamin d) .[name]... Take 1 tablet by mouth once a day    Vitamin D 1000 Unit Caps (Cholecalciferol) .[name]... Take 1 cap by mouth once daily...   Complete Medication List: 1)   Bayer Aspirin Ec Low Dose 81 Mg Tbec (Aspirin) .... Take 1 tablet by mouth once a day 2)  Verapamil Hcl Cr 240 Mg Cp24 (Verapamil hcl) .... Take 1 capsule by mouth once a day 3)  Lisinopril 20 Mg Tabs (Lisinopril) .... Take 1 tablet by mouth once a day 4)  Zocor 20 Mg Tabs (Simvastatin) .... Take 1 tab by mouth at bedtime 5)  Levothroid 50 Mcg Tabs (Levothyroxine sodium) .... Take 1/2  tablet by mouth once a day 6)  Fosamax 70 Mg Tabs (Alendronate sodium) .... Take 1 tablet by mouth once a week 7)  Calcium Carbonate-vitamin D 600-400 Mg-unit Tabs (Calcium carbonate-vitamin d) .... Take 1 tablet by mouth once a day 8)  Multivitamins Tabs (Multiple vitamin) .... Take 1 tablet by mouth once a day 9)  Vitamin D 1000 Unit Caps (Cholecalciferol) .... Take 1 cap by mouth once daily... 10)  Nova Max Glucose Test Strp (Glucose blood) .... Use as directed 11)  Nova Sureflex Lancets Misc (Lancets) .... Use as directed to check blood sugars  Patient Instructions: 1)  Today we updated your med list- see below.... 2)  We refilled your meds for 2010... 3)  We may be able to STOP the Synthroid- let's wait for the blood work to return & then decide... if we do stop it- then we will want to check a TSH blood test in about 2 months (call us before tax day- 4/15- to set this up in the computer system).Marland KitchenMarland Kitchen 4)  Today we did your FASTING blood work... please call the "phone tree" in a few days for your lab results.Marland KitchenMarland Kitchen  5)  Call for any questions.Marland KitchenMarland Kitchen 6)  Please schedule a follow-up appointment in 6 months, sooner as needed. Prescriptions: FOSAMAX 70 MG  TABS (ALENDRONATE SODIUM) Take 1 tablet by mouth once a week  #12 x 4   Entered and Authorized by:   Michele Mcalpine MD   Signed by:   Michele Mcalpine MD on 08/12/2008   Method used:   Print then Give to Patient   RxID:   [id number] ZOCOR 20 MG  TABS (SIMVASTATIN) Take 1 tab by mouth at bedtime  #90 x 4   Entered and Authorized  by:   Michele Mcalpine MD   Signed by:    Michele Mcalpine MD on 08/12/2008   Method used:   Print then Give to Patient   RxID:   [id number] LISINOPRIL 20 MG  TABS (LISINOPRIL) Take 1 tablet by mouth once a day  #90 x 4   Entered and Authorized by:   Michele Mcalpine MD   Signed by:   Michele Mcalpine MD on 08/12/2008   Method used:   Print then Give to Patient   RxID:   [id number] VERAPAMIL HCL CR 240 MG  CP24 (VERAPAMIL HCL) Take 1 capsule by mouth once a day  #90 x 4   Entered and Authorized by:   Michele Mcalpine MD   Signed by:   Michele Mcalpine MD on 08/12/2008   Method used:   Print then Give to Patient   RxID:   [id number]

## 2010-08-09 NOTE — Assessment & Plan Note (Signed)
Summary: rov/apc   Primary Care Provider:  Kriste Basque  CC:  6 month ROV 7 review of mult medical problems....  History of Present Illness: 77 y/o WF here for a follow up visit... she has mult med problems as noted below ...    ~  sees DrPerry for GI- divertics, polyps, hems & a bout of diverticulitis in Mar08...   ~  sees Dr [name] Caprice Urology w/ microscopic hematuria and a neg eval 5/08...  ~  sees Film/video editor for Ophthal w/ macular degen- 8/09 note reviewed...  ~  sees DrLLomax for Derm w/ alopecia- offered Rogaine but decided against it... she wonders about her Synthroid medication (something she read)- we have been decr the dose & stopped it 2/10.   ~  February 08, 2009:  6 mo check up doing well without new complaints or concerns... she weaned off the Synthroid and f/u TFT's have been normal, energy good, etc... stable on meds and BP at drug store= OK per pt but not checking at home... sl elevated here today & she will monitor carefully & call us w/ any questions...   ~  July 30, 2009:  she is still concerned our "lump" in her right side/ flank area= mod sized lipoma w/ prev check by [facility], DrLeone w/ rec for no surg... she is anxious about it & we discussed getting a second opinion about excision from [facility] to compare notes... BP remains controlled on meds, Chol has been good on the Simva20, & TFT's normal off the prev synthroid Rx...  she will be due for f/u BMD in March w/ poss of drug holiday discussed...    Current Problem List:  MACULAR DEGENERATION (ICD-362.50) - eval by DrHecker w/ foveal telangiectasia, macular drusen, macular degeneration, and cataracts- being followed regularly... vision is preserved so far...  ~  10/10:  f/u DrHecker- stable, no retinopathy.  HYPERTENSION (ICD-401.9) - on VERAPAMIL 240mg /d,  LISINOPRIL 20mg /d, and diet therapy... BP= 130/84 today... she feels well and takes meds regularly... denies HA, fatigue, visual changes, CP, palipit, dizziness, syncope, dyspnea,  edema, etc... prev Rx'd for swelling w/ Dyazide but she got on a low sodium diet & was able to DC the diuretic...  ~  8/10: borderline BP here and she will monitor at home carefully & call if not <150/90.  ~  1/11:  BP improved  ~130/84... same meds.  DYSLIPIDEMIA (ICD-272.4) - on ZOCOR 20mg /d... plus doing well on diet...  ~  FLP 6/09 showed TChol 144, TG 140, HDL 39, LDL 77... continue same.  ~  FLP 2/10 showed TChol 164, TG 77, HDL 54, LDL 95  ~  FLP 8/10 showed TChol 138, TG 51, HDL 48, LDL 80  ~  FLP 1/11 showed =   DIABETES MELLITUS, BORDERLINE (ICD-790.29) - on diet alone... FBS @ home betw 120-130 recently... she is on a good diet and weight is stable at 143# today (63"  BMI= 25)...  ~  labs 3/09 showed FBS= 119... subseq HgA1c 5/09 was 6.4... diet Rx.  ~  labs 8/09 showed BS= 106, HgA1c= 5.9.Marland KitchenMarland Kitchen rec- same.  ~  labs 2/10 showed BS= 102, A1c= 5.9  ~  labs 8/10 showed BS= 103, A1c= 5.9  ~  labs 1/11 showed =   HYPOTHYROIDISM (ICD-244.9) - off SYNTHROID now...  clinically & biochemically euthyroid... remote hx of overactive thyroid many yrs ago in [place] (we don't have records)... she had an eval by DrKohut in 1987 w/ familial multinodular goiter w/ neg FNA.Marland KitchenMarland Kitchen  started on Rx but goiter enlarged- had thyroidectomy 1990 by DrWeatherly...  ~  labs 3/09 showed TSH = 0.35 (0.35-5.5)... we have slowly decr her dose to 61mcg/d...  ~  labs 8/09 showed TSH= 0.33... rec- decr the Synth50 to 1/2 tab daily.  ~  labs 2/10 showed =TSH= 0.54 and Synthroid stopped...  ~  labs 4/10 off med showed TSH= 0.59  ~  labs 8/10 showed TSH= 0.60  ~  labs 1/11 showed =   DIVERTICULOSIS OF COLON (ICD-562.10) COLONIC POLYPS (ICD-211.3) - followed by DrPerry... bout of diverticulitis in Mar08...  ~  last colonoscopy 3/08 showed divertics, 2mm polyp, hems... path=hyperplastic, f/u planned 21yrs.  HEMATURIA, HX OF (ICD-V13.09) - eval 4/08 by DrGrapey w/ neg Sonar, Cysto, cytology...  DEGENERATIVE JOINT DISEASE  (ICD-715.90) - c/o discomfort in her shoulders and knees... uses OTC meds as needed.  Hx of OSTEOPOROSIS (ICD-733.00) - on FOSAMAX 70/wk, Caltrate 1/d, MVI, Vit D 1000/d...  ~  initial BMD 10/01 showed TScores -0.2  to -3.2 in the spine...   ~  f/u studies w/ steady improvement on Fosamax therapy...  ~  BMD 3/09 w/ TScores -0.1 to -1.7 in the spine... continue Rx.  ~  f/u BMD due 3/11 =   ? of MEMORY LOSS (ICD-780.93) - MMSE 5/09 by Shanon Brow, NP was 29/30... reassured.  ANXIETY (ICD-300.00) - prev on Alpraz Prn but hasn't used in yrs...  LIPOMA (ICD-214.9) - notes large lipoma in RUQ/ rt flank area... some discomfort noted... seen by DrLeone in the past w/o surg... offered gen surg f/u whenever she is ready to consider excision...  SHINGLES - remote hx of right T8-9 shingles in 1986...   Allergies: 1)  ! Epinephrine  Comments:  Nurse/Medical Assistant: The patient's medications and allergies were reviewed with the patient and were updated in the Medication and Allergy Lists.  Past History:  Past Medical History:  MACULAR DEGENERATION (ICD-362.50) HYPERTENSION (ICD-401.9) DYSLIPIDEMIA (ICD-272.4) DIABETES MELLITUS, BORDERLINE (ICD-790.29) HYPOTHYROIDISM (ICD-244.9) DIVERTICULOSIS OF COLON (ICD-562.10) COLONIC POLYPS (ICD-211.3) HEMATURIA, HX OF (ICD-V13.09) DEGENERATIVE JOINT DISEASE (ICD-715.90) Hx of OSTEOPOROSIS (ICD-733.00) ? of MEMORY LOSS (ICD-780.93) ANXIETY (ICD-300.00) LIPOMA (ICD-214.9)  Past Surgical History: S/P thyroidectomy 1990 by DrWeatherly  Family History: Reviewed history from 02/10/2008 and no changes required. mother deceased at age 44--stroke father deceased at 20 in the 2nd World War sibling deceased at 65 with MI--brother  Social History: Reviewed history from 02/10/2008 and no changes required. 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         She is anxious about the Lipoma in her right side...  Vital Signs:  Patient profile:   77 year old female Height:      63 inches Weight:      143 pounds O2 Sat:      98 % on Room air Temp:     96.8 degrees F oral Pulse rate:   72 / minute BP sitting:   130 / 84  (left arm) Cuff size:   regular  Vitals Entered By: Randell Loop CMA (July 30, 2009 10:26 AM)  O2 Sat at Rest %:  98  O2 Flow:  Room air CC: 6 month ROV 7 review of mult medical problems... Is Patient Diabetic? No Pain Assessment Patient in pain? no      Comments no changes in meds   Physical Exam  Additional Exam:  WD, WN, 77 y/o WF in NAD... GENERAL:  Alert & oriented; pleasant & cooperative... HEENT:  [place]/AT, EOM-wnl, PERRLA, EACs-clear, TMs-wnl, NOSE-clear, THROAT-clear & wnl. NECK:  Supple w/ fair ROM; no JVD; normal carotid impulses w/o bruits; scar of prev thyroid surg- no nodules or goiter; no lymphadenopathy. CHEST:  Clear to P & A; without wheezes/ rales/ or rhonchi. HEART:  Regular Rhythm; without murmurs/ rubs/ or gallops. ABDOMEN:  Soft & nontender; normal bowel sounds; no organomegaly or masses detected. EXT: without deformities, mild arthritic changes; no varicose veins/ venous insuffic/ or edema. NEURO:  CN's intact;  no focal neuro deficits... DERM:  Lipoma in RUQ/ right flank area... no other lesions noted...     MISC. Report  Procedure date:  07/30/2009  Findings:      She will return to our lab for FASTINg blood work next week, and we will arrange for BMD 3/11... we will  contact her w/ these results when avail...  SN   Impression & Recommendations:  Problem # 1:  LIPOMA (ICD-214.9) She is anxious-  refer to [facility] for excision... Orders: Surgical Referral (Surgery)  Problem # 2:  HYPERTENSION (ICD-401.9) Controlled-  same meds, due for f/u labs. Her updated medication list for this problem includes:    Verapamil Hcl Cr 240 Mg Cp24 (Verapamil hcl) .[name]... Take 1 capsule by mouth once a day    Lisinopril 20 Mg Tabs (Lisinopril) .[name]... Take 1 tablet by mouth once a day  Problem # 3:  DYSLIPIDEMIA (ICD-272.4) Stable on Rx- due for f/u labs... Her updated medication list for this problem includes:    Zocor 20 Mg Tabs (Simvastatin) .[name]... Take 1 tab by mouth at bedtime  Problem # 4:  DIABETES MELLITUS, BORDERLINE (ICD-790.29) Stable on diet alone...  Problem # 5:  HYPOTHYROIDISM (ICD-244.9) Doing satis off meds-  due for f/u TSH...  Problem # 6:  COLONIC POLYPS (ICD-211.3) GI is stable & up to date...  Problem # 7:  Hx of OSTEOPOROSIS (ICD-733.00) Due for f/u BMD in March- may be candidate for drug holiday... Her updated medication list for this problem includes:    Fosamax 70 Mg Tabs (Alendronate sodium) .[name]... Take 1 tablet by mouth once a week  Problem # 8:  OTHER MEDICAL PROBLEMS AS NOTED>>>  Complete Medication List: 1)  Bayer Aspirin Ec Low Dose 81 Mg Tbec (Aspirin) .... Take 1 tablet by mouth once a day 2)  Verapamil Hcl Cr 240 Mg Cp24 (Verapamil hcl) .... Take 1 capsule by mouth once a day 3)  Lisinopril 20 Mg Tabs (Lisinopril) .... Take 1 tablet by mouth once a day 4)  Zocor 20 Mg Tabs (Simvastatin) .... Take 1 tab by mouth at bedtime 5)  Fosamax 70 Mg Tabs (Alendronate sodium) .... Take 1 tablet by mouth once a week 6)  Calcium Carbonate-vitamin D 600-400 Mg-unit Tabs (Calcium carbonate-vitamin d) .... Take 1 tablet by mouth once a day 7)  Multivitamins Tabs (Multiple vitamin) .... Take 1 tablet by mouth once a day 8)  Vitamin D 1000 Unit  Caps (Cholecalciferol) .... Take 1 cap by mouth once daily.Marland KitchenMarland Kitchen 9)  Nova Max Glucose Test Strp (Glucose blood) .... Use as directed 10)  Nova Sureflex Lancets Misc (Lancets) .... Use as  directed to check blood sugars  Patient Instructions: 1)  Today we updated your med list- see below.... 2)  Continue your current meds the same... 3)  Please return to our lab one morning next week for your FASTING blood work... please call the "phone tree" in a few days for your lab results.Marland KitchenMarland Kitchen 4)  We will arrange for an appt w/ the surgeons at [facility] to check the Lipoma in your side... 5)  Call for any problems.Marland KitchenMarland Kitchen 6)  Please schedule a follow-up appointment in 6 months.

## 2010-08-09 NOTE — Miscellaneous (Signed)
Summary: Flu Vaccine / [facility]  Flu Vaccine / [facility]   Imported By: Lennie Odor 05/05/2010 11:43:18  _____________________________________________________________________  External Attachment:    Type:   Image     Comment:   External Document

## 2010-08-11 NOTE — Assessment & Plan Note (Signed)
Summary: 6 month return/mhh   Primary Care Provider:  Kriste Basque  CC:  6 month ROV & review of mult medical problems....  History of Present Illness: 77 y/o WF here for a follow up visit... she has mult med problems as noted below ...    ~  sees DrPerry for GI- divertics, polyps, hems & a bout of diverticulitis in Mar08...   ~  sees Dr [name] Caprice Urology w/ microscopic hematuria and a neg eval 5/08...  ~  sees Film/video editor for Ophthal w/ macular degen- 8/09 note reviewed...  ~  sees DrLLomax for Derm w/ alopecia- offered Rogaine but decided against it...   ~  Jan11:  she is still concerned our "lump" in her right side/ flank area= mod sized lipoma w/ prev check by [facility], DrLeone w/ rec for no surg... she is anxious about it & we discussed getting a second opinion about excision from [facility] to compare notes (saw DrCornett who offered surg if she wants)... BP remains controlled on meds, Chol has been good on the Simva20, & TFT's normal off the prev Synthroid Rx...  ~  Jul11:  she increased her Lisinopril from 20mg  to 30mg  on her own when home BP checks inched up above 150sys... she notes some heel pain & we discussed soaks & stretching exercises... she remains on diet & exerc program- weight down 4# & due for f/u labs...   ~  July 19, 2010:  she raised a question about her Simva20 & the data on DM> her BS's have been normal- no signs of DM & her A1c's all  ~6, we will continue to monitor... notes some LBP after recent trip to [place]> offered XRay & meds but she'll do heat etc & let me know... BP controlled on meds;  Chol looks good on the Simva20;  Thyroid stable off med;  BMD done 7/11 showed osteopenia in Spine on Calcium, MVI, Vit D & bisphos drug holiday since 7/11...    Current Problem List:  MACULAR DEGENERATION (ICD-362.50) - eval by DrHecker w/ foveal telangiectasia, macular drusen, macular degeneration, and cataracts- being followed regularly... vision is preserved so far...  ~  11/11:  f/u DrHecker-  stable age related mac degen, no retinopathy.  HYPERTENSION (ICD-401.9) - on VERAPAMIL 240mg /d,  LISINOPRIL 20mg - she incr to 1.5tabs/d, and diet therapy... BP= 132/82 today... she feels well and takes meds regularly... denies HA, fatigue, visual changes, CP, palipit, dizziness, syncope, dyspnea, edema, etc... prev Rx'd for swelling w/ Dyazide but she got on a low sodium diet & was able to DC the diuretic...  ~  8/10: borderline BP here and she will monitor at home carefully & call if not <150/90.  ~  7/11: she reports that she incr the Lisinopril to 1&1/2 tabs due to BP ~150+, now better...  DYSLIPIDEMIA (ICD-272.4) - on ZOCOR 20mg /d... plus doing well on diet...  ~  FLP 6/09 showed TChol 144, TG 140, HDL 39, LDL 77... continue same.  ~  FLP 2/10 showed TChol 164, TG 77, HDL 54, LDL 95  ~  FLP 8/10 showed TChol 138, TG 51, HDL 48, LDL 80  ~  FLP 1/11 showed TChol 155, TG 100, HDL 48, LDL 87  ~  FLP 7/11 showed TChol 158, TG 74, HDL 55, LDL 89  ~  FLP 1/12 showed TChol 155, TG 101, HDL 50, LDL 85  DIABETES MELLITUS, BORDERLINE (ICD-790.29) - on diet alone... FBS @ home betw 120-130 recently... she is on a good diet and  weight is down at 139# today (63"  BMI= 24)...  ~  labs 3/09 showed FBS= 119... subseq HgA1c 5/09 was 6.4... diet Rx.  ~  labs 8/09 showed BS= 106, HgA1c= 5.9.Marland KitchenMarland Kitchen rec- same.  ~  labs 2/10 showed BS= 102, A1c= 5.9  ~  labs 8/10 showed BS= 103, A1c= 5.9  ~  labs 1/11 showed BS= 98  ~  labs 7/11 showed BS= 89, A1c= 6.0  ~  labs 1/12 showed BS= 95  HYPOTHYROIDISM (ICD-244.9) - off SYNTHROID now...  clinically & biochemically euthyroid... remote hx of overactive thyroid many yrs ago in [place] (we don't have records)... she had an eval by DrKohut in 1987 w/ familial multinodular goiter w/ neg FNA... started on Rx but goiter enlarged- had thyroidectomy 1990 by DrWeatherly...  ~  labs 3/09 showed TSH = 0.35 (0.35-5.5)... we have slowly decr her dose to 48mcg/d...  ~  labs 8/09 showed TSH=  0.33... rec- decr the Synth50 to 1/2 tab daily.  ~  labs 2/10 showed =TSH= 0.54 and Synthroid stopped...  ~  labs 4/10 off med showed TSH= 0.59  ~  labs 8/10 showed TSH= 0.60  ~  labs 1/11 showed TSH= 0.60  ~  labs 7/11 showed TSH= 0.56  ~  labs 1/12 showed TSH= 0.60  DIVERTICULOSIS OF COLON (ICD-562.10) COLONIC POLYPS (ICD-211.3) - followed by DrPerry... bout of diverticulitis in Mar08...  ~  last colonoscopy 3/08 showed divertics, 2mm polyp, hems... path=hyperplastic, f/u planned 28yrs.  HEMATURIA, HX OF (ICD-V13.09) - eval 4/08 by DrGrapey w/ neg Sonar, Cysto, cytology...  DEGENERATIVE JOINT DISEASE (ICD-715.90) - c/o discomfort in her shoulders and knees... uses OTC meds as needed.  Hx of OSTEOPOROSIS (ICD-733.00) - on Fosamax 70/wk thru 7/11, Caltrate 1/d, MVI, Vit D 1000/d...  ~  initial BMD 10/01 showed TScores -0.2  to -3.2 in the spine...   ~  f/u studies w/ steady improvement on Fosamax therapy...  ~  BMD 3/09 w/ TScores -0.1 to -1.7 in the spine... continue Rx.  ~  BMD 7/11 showed TScores -2.0 Spine, and -0.3 in right FemNeck... we opted for Bisphos drug holiday to start now.  ? of MEMORY LOSS (ICD-780.93) - MMSE 5/09 by Shanon Brow, NP was 29/30... reassured.  ANXIETY (ICD-300.00) - prev on Alpraz Prn but hasn't used in yrs...  LIPOMA (ICD-214.9) - notes large lipoma in RUQ/ rt flank area... some discomfort noted... seen by DrLeone in the past w/o surg... she saw DrCornett, [facility], 1/11 for second opinion & she will decide re: excision...  SHINGLES - remote hx of right T8-9 shingles in 1986...   Preventive Screening-Counseling & Management  Alcohol-Tobacco     Smoking Status: never  Allergies: 1)  ! Epinephrine  Comments:  Nurse/Medical Assistant: The patient's medications and allergies were reviewed with the patient and were updated in the Medication and Allergy Lists.  Past History:  Past Medical History: MACULAR DEGENERATION (ICD-362.50) HYPERTENSION  (ICD-401.9) DYSLIPIDEMIA (ICD-272.4) DIABETES MELLITUS, BORDERLINE (ICD-790.29) HYPOTHYROIDISM (ICD-244.9) DIVERTICULOSIS OF COLON (ICD-562.10) COLONIC POLYPS (ICD-211.3) HEMATURIA, HX OF (ICD-V13.09) DEGENERATIVE JOINT DISEASE (ICD-715.90) Hx of OSTEOPOROSIS (ICD-733.00) ? of MEMORY LOSS (ICD-780.93) ANXIETY (ICD-300.00) LIPOMA (ICD-214.9)  Past Surgical History: S/P thyroidectomy 1990 by DrWeatherly  Family History: Reviewed history from 02/10/2008 and no changes required. mother deceased at age 63--stroke father deceased at 31 in the 2nd World War sibling deceased at 29 with MI--brother  Social History: Reviewed history from 02/10/2008 and no changes required. 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  The patient denies anorexia, fever, weight loss, weight gain, vision loss, decreased hearing, hoarseness, chest pain, syncope, peripheral edema, prolonged cough, headaches, hemoptysis, abdominal pain, melena, hematochezia, severe indigestion/heartburn, hematuria, incontinence, muscle weakness, suspicious skin lesions, transient blindness, difficulty walking, depression, unusual weight change, abnormal bleeding, enlarged lymph nodes, and angioedema.    Vital Signs:  Patient profile:   77 year old female Height:      63 inches Weight:      138.13 pounds O2 Sat:      99 % on Room air Temp:     98.7 degrees F oral Pulse rate:   60 / minute BP sitting:   132 / 82  (left arm) Cuff size:   regular  Vitals Entered By: Randell Loop CMA (July 19, 2010 10:03 AM)  O2 Sat at Rest %:  99 O2 Flow:  Room air CC: 6 month ROV & review of mult medical problems... Is Patient Diabetic? No Pain Assessment Patient in pain? no      Comments meds updated today with pt   Physical Exam  Additional Exam:  WD, WN, 77 y/o WF in NAD... GENERAL:  Alert & oriented; pleasant & cooperative... HEENT:   [place]/AT, EOM-wnl, PERRLA, EACs-clear, TMs-wnl, NOSE-clear, THROAT-clear & wnl. NECK:  Supple w/ fair ROM; no JVD; normal carotid impulses w/o bruits; scar of prev thyroid surg- no nodules or goiter; no lymphadenopathy. CHEST:  Clear to P & A; without wheezes/ rales/ or rhonchi. HEART:  Regular Rhythm; without murmurs/ rubs/ or gallops. ABDOMEN:  Soft & nontender; normal bowel sounds; no organomegaly or masses detected. EXT: without deformities, mild arthritic changes; no varicose veins/ venous insuffic/ or edema. prob heel spurs w/ some plantar fascia pain> advised soaks & stretching... NEURO:  CN's intact;  no focal neuro deficits... DERM:  Lipoma in RUQ/ right flank area... no other lesions noted...    MISC. Report  Procedure date:  07/19/2010  Findings:      BMP (METABOL)   Sodium                    143 mEq/L                   135-145   Potassium                 4.8 mEq/L                   3.5-5.1   Chloride                  107 mEq/L                   96-112   Carbon Dioxide            30 mEq/L                    19-32   Glucose                   95 mg/dL                    08-65   BUN                       22 mg/dL  6-23   Creatinine                0.8 mg/dL                   8.1-1.9   Calcium                   9.3 mg/dL                   1.4-78.2   GFR                       75.25 mL/min                >60.00  Hepatic/Liver Function Panel (HEPATIC)   Total Bilirubin           0.8 mg/dL                   9.5-6.2   Direct Bilirubin          0.1 mg/dL                   1.3-0.8   Alkaline Phosphatase      54 U/L                      39-117   AST                       19 U/L                      0-37   ALT                       16 U/L                      0-35   Total Protein             6.8 g/dL                    6.5-7.8   Albumin                   3.9 g/dL                    4.6-9.6  CBC Platelet w/Diff (CBCD)   White Cell Count          8.6 K/uL                     4.5-10.5   Red Cell Count            4.62 Mil/uL                 3.87-5.11   Hemoglobin                14.2 g/dL                   29.5-28.4   Hematocrit                41.6 %                      36.0-46.0   MCV                       90.1 fl  78.0-100.0   Platelet Count            214.0 K/uL                  150.0-400.0   Neutrophil %              62.8 %                      43.0-77.0   Lymphocyte %              26.5 %                      12.0-46.0   Monocyte %                7.6 %                       3.0-12.0   Eosinophils%              2.3 %                       0.0-5.0   Basophils %               0.8 %                       0.0-3.0  Comments:      Lipid Panel (LIPID)   Cholesterol               155 mg/dL                   1-610   Triglycerides             101.0 mg/dL                 9.6-045.4   HDL                       09.81 mg/dL                 >19.14   LDL Cholesterol           85 mg/dL                    7-82   TSH (TSH)   FastTSH                   0.60 uIU/mL                 0.35-5.50   Impression & Recommendations:  Problem # 1:  HYPERTENSION (ICD-401.9) Controlled>  same meds. Her updated medication list for this problem includes:    Verapamil Hcl Cr 240 Mg Cp24 (Verapamil hcl) .[name]... Take 1 capsule by mouth once a day    Lisinopril 20 Mg Tabs (Lisinopril) .[name]... Take as directed...  Orders: TLB-BMP (Basic Metabolic Panel-BMET) (80048-METABOL) TLB-Hepatic/Liver Function Pnl (80076-HEPATIC) TLB-CBC Platelet - w/Differential (85025-CBCD) TLB-Lipid Panel (80061-LIPID) TLB-TSH (Thyroid Stimulating Hormone) (84443-TSH)  Problem # 2:  DYSLIPIDEMIA (ICD-272.4) Doing well on Simva20>  continue same... Her updated medication list for this problem includes:    Zocor 20 Mg Tabs (Simvastatin) .[name]... Take 1 tab by mouth at bedtime...  Problem # 3:  DIABETES MELLITUS, BORDERLINE (ICD-790.29) BS is normal>  no signs of DM...  Problem # 4:   HYPOTHYROIDISM (ICD-244.9) TFTs remain normal & stable  off the Synthroid...  Problem # 5:  COLONIC POLYPS (ICD-211.3) GI is stable & up to date...  Problem # 6:  Hx of OSTEOPOROSIS (ICD-733.00) She has Osteopenia on Bisphos drug holiday at preesent> continue calcium, MVI, vit d...  Problem # 7:  OTHER MEDICAL PROBLEMS AS NOTED>>>  Complete Medication List: 1)  Bayer Aspirin Ec Low Dose 81 Mg Tbec (Aspirin) .... Take 1 tablet by mouth once a day 2)  Verapamil Hcl Cr 240 Mg Cp24 (Verapamil hcl) .... Take 1 capsule by mouth once a day 3)  Lisinopril 20 Mg Tabs (Lisinopril) .... Take as directed... 4)  Zocor 20 Mg Tabs (Simvastatin) .... Take 1 tab by mouth at bedtime.Marland KitchenMarland Kitchen 5)  Calcium Carbonate-vitamin D 600-400 Mg-unit Tabs (Calcium carbonate-vitamin d) .... Take 1 tablet by mouth once a day 6)  Multivitamins Tabs (Multiple vitamin) .... Take 1 tablet by mouth once a day 7)  Vitamin D 1000 Unit Caps (Cholecalciferol) .... Take 1 cap by mouth once daily.Marland KitchenMarland Kitchen 8)  Clotrimazole-betamethasone 1-0.05 % Crea (Clotrimazole-betamethasone) .... Apply to rash two times a day as needed...  Patient Instructions: 1)  Today we updated your med list- see below.... 2)  Continue your current meds the same... 3)  Today we did your follow up FASTING blood work... please call the "phone tree" in a few days for your lab results.Marland KitchenMarland Kitchen 4)  Keep up the good job w/ diet & exercise... 5)  Call for any problems.Marland KitchenMarland Kitchen 6)  Please schedule a follow-up appointment in 6 months. Prescriptions: CLOTRIMAZOLE-BETAMETHASONE 1-0.05 % CREA (CLOTRIMAZOLE-BETAMETHASONE) apply to rash two times a day as needed...  #1 tube x prn   Entered and Authorized by:   Michele Mcalpine MD   Signed by:   Michele Mcalpine MD on 07/19/2010   Method used:   Print then Give to Patient   RxID:   [id number]

## 2010-10-11 ENCOUNTER — Encounter: Payer: Self-pay | Admitting: Pulmonary Disease

## 2010-11-11 ENCOUNTER — Other Ambulatory Visit: Payer: Self-pay | Admitting: *Deleted

## 2010-11-11 MED ORDER — VERAPAMIL HCL ER 240 MG PO CP24: ORAL_CAPSULE | ORAL | Status: DC

## 2010-11-17 ENCOUNTER — Other Ambulatory Visit: Payer: Self-pay | Admitting: *Deleted

## 2010-11-17 MED ORDER — SIMVASTATIN 20 MG PO TABS: 20.0000 mg | ORAL_TABLET | Freq: Every day | ORAL | Status: DC

## 2010-11-25 NOTE — Assessment & Plan Note (Signed)
[facility]                         GASTROENTEROLOGY OFFICE NOTE   NAME:Cynthia Mathis, Cynthia Mathis                  MRN:          [id number]  DATE:09/20/2006                            DOB:          January 08, 1933    REASON FOR CONSULTATION:  Abdominal pain.   HISTORY:  This is a 77 year old white female who is worked into today's  office with complaints of new onset abdominal pain.  This is her first  GI office visit, though she has undergone prior colonoscopies for  adenomatous colon polyps.  As a matter of fact, her last colonoscopy was  September 18, 2006.  She was found to have pandiverticulosis with marked  changes throughout.  As well, a tiny sigmoid colon polyp which was  removed with a cold snare.  Post procedure she did well without pain or  problems.  As well, following day no issues until about midnight last  evening when she promptly developed problems with left lower quadrant  abdominal discomfort.  The discomfort is made worse with jarring or  movement activities.  She denies nausea, vomiting, fevers, bleeding,  dysuria, or hematuria.  She has a remote history of diverticulitis.  No  history of pyelonephritis or kidney stones.  She contacted the on call  physician last evening, recommended contacting the office this morning  regarding these issues.  After a phone conversation she was brought into  the office.  She reports that her discomfort, if anything, is a bit  better last night.  She described it last evening as a 9 out of 10 on  the pain scale, and currently 3 out of 10.  She is accompanied by her  husband.   PAST MEDICAL HISTORY:  1. Hypertension.  2. Dyslipidemia.  3. Hypothyroidism.  4. Adenomatous colon polyps.   PAST SURGICAL HISTORY:  None.   ALLERGIES:  EPINEPHRINE.   CURRENT MEDICATIONS:  1. Verapamil 240 mg daily.  2. Lisinopril 20 mg daily.  3. Synthroid 0.125 mg daily.  4. Zocor 20 mg daily.  5. Fosamax 70 mg weekly.  6. Aspirin 81 mg daily.  7. Multivitamin.  8. Calcium.   FAMILY HISTORY:  No family history of gastrointestinal malignancy.   SOCIAL HISTORY:  The patient is married with 3 children, she is  accompanied by her husband.  She does not smoke or use alcohol.   REVIEW OF SYSTEMS:  Per diagnostic evaluation form.   PHYSICAL EXAMINATION:  Uncomfortable appearing female in no acute  distress.  Her cheeks are a bit flushed.  She is alert and oriented.  Blood pressure is 138/82, heart rate is 60 and regular, her weight is  148.6 pounds, she is 5 feet 3 inches in height, her temperature is 98.8  degrees Fahrenheit.  HEENT:  Sclerae are anicteric, conjunctivae are pink, oral mucosa  intact.  LUNGS:  Clear.  HEART:  Regular.  ABDOMEN:  Soft with focal tenderness and guarding in the left lower  quadrant.  Good bowel sounds heard, no mass felt.  There is no  costovertebral angle tenderness to percussion.  EXTREMITIES:  Without edema.   IMPRESSION:  Focal lower abdominal  pain with evidence of peritoneal  irritation most consistent with acute diverticulitis.   RECOMMENDATIONS:  1. CBC and urinalysis today.  2. Prescribe Augmentin 875 mg p.o. b.i.d. for 10 days.  3. Darvocet N-100 p.r.n. pain.  4. Modified diet to clear liquids, to be advanced to full liquids when      feeling better.  Thereafter low residue diet as tolerated.  5. Discussion and information as well as literature on diverticulitis.  6. Office followup in one week.  They have been strictly advised that      if her condition were to worsen in the interim to contact the      office or go to the emergency room.     Wilhemina Bonito. Marina Goodell, MD  Electronically Signed    JNP/MedQ  DD: 09/20/2006  DT: 09/21/2006  Job #: [id number]   cc:   Lonzo Cloud. Kriste Basque, MD  Huel Cote, M.D.

## 2010-11-25 NOTE — Assessment & Plan Note (Signed)
[facility]                         GASTROENTEROLOGY OFFICE NOTE   NAME:Guillermo, MAEGEN WIGLE                  MRN:          [id number]  DATE:09/27/2006                            DOB:          May 02, 1933    HISTORY:  This is a 77 year old female who was evaluated in the office  last week with abdominal pain.  She was felt to have acute  diverticulitis.  CBC was obtained and revealed an elevated white blood  cell count at 12.0.  Urinalysis was essentially negative, though she was  noted to have 3-5 red blood cells as well as moderate blood on the urine  dipstick.  She was prescribed Augmentin 875 mg p.o. b.i.d. as well as a  modified diet.  Within 3 days her symptoms resolved.  She did not need  to use Darvocet for pain.  She is now on a regular diet.  No other  issues or complaints at this time.   Medications remain verapamil, lisinopril, Synthroid, Zocor, Fosamax,  aspirin, multivitamin, and calcium, as well as Augmentin.   PHYSICAL EXAMINATION:  GENERAL:  A well-appearing female in no acute  distress.  VITAL SIGNS:  Blood pressure is 124/62, heart rate is 60 and regular,  weight is 147 pounds.  HEENT:  Sclerae are anicteric.  LUNGS:  Clear.  CARDIAC:  Heart is regular.  ABDOMEN:  Soft with mild tenderness in the left lower quadrant to deep  palpation.  Good bowel sounds.  No mass felt.   IMPRESSION:  Recent bout of acute diverticulitis, now improved on  antibiotic therapy.   RECOMMENDATIONS:  1. Complete course of antibiotic therapy.  2. Additional prescription for Augmentin given should the patient have      recurrent symptoms in the future and need to resume the medication.      Of course, she knows to advise the office of this action.  I would      also like to repeat her CBC today to assure that her white blood      cell count has returned to normal.  In addition, I will repeat her      urinalysis to make sure there is no persistence of  red blood cells      in the urine.  She will resume her general medical care with Dr.      Kriste Basque.     Wilhemina Bonito. Marina Goodell, MD  Electronically Signed    JNP/MedQ  DD: 09/27/2006  DT: 09/27/2006  Job #: [id number]   cc:   Lonzo Cloud. Kriste Basque, MD  Huel Cote, M.D.

## 2011-01-17 ENCOUNTER — Ambulatory Visit: Payer: Self-pay | Admitting: Pulmonary Disease

## 2011-01-23 ENCOUNTER — Ambulatory Visit (INDEPENDENT_AMBULATORY_CARE_PROVIDER_SITE_OTHER): Payer: Medicare Other | Admitting: Pulmonary Disease

## 2011-01-23 ENCOUNTER — Encounter: Payer: Self-pay | Admitting: Pulmonary Disease

## 2011-01-23 DIAGNOSIS — D126 Benign neoplasm of colon, unspecified: Secondary | ICD-10-CM

## 2011-01-23 DIAGNOSIS — M810 Age-related osteoporosis without current pathological fracture: Secondary | ICD-10-CM

## 2011-01-23 DIAGNOSIS — E785 Hyperlipidemia, unspecified: Secondary | ICD-10-CM

## 2011-01-23 DIAGNOSIS — I10 Essential (primary) hypertension: Secondary | ICD-10-CM

## 2011-01-23 DIAGNOSIS — E039 Hypothyroidism, unspecified: Secondary | ICD-10-CM

## 2011-01-23 DIAGNOSIS — F411 Generalized anxiety disorder: Secondary | ICD-10-CM

## 2011-01-23 DIAGNOSIS — K5730 Diverticulosis of large intestine without perforation or abscess without bleeding: Secondary | ICD-10-CM

## 2011-01-23 DIAGNOSIS — M1990 Unspecified osteoarthritis, unspecified site: Secondary | ICD-10-CM

## 2011-01-23 DIAGNOSIS — R7309 Other abnormal glucose: Secondary | ICD-10-CM

## 2011-01-23 MED ORDER — VERAPAMIL HCL ER 240 MG PO CP24: ORAL_CAPSULE | ORAL | Status: DC

## 2011-01-23 MED ORDER — LISINOPRIL 20 MG PO TABS: ORAL_TABLET | ORAL | Status: DC

## 2011-01-23 MED ORDER — SIMVASTATIN 20 MG PO TABS: 20.0000 mg | ORAL_TABLET | Freq: Every day | ORAL | Status: DC

## 2011-01-23 NOTE — Progress Notes (Signed)
Subjective:    Patient ID: Eleftheria Neukam, female    DOB: 11-26-1932, 77 y.o.   MRN: [id number]  HPI 77 y/o WF here for a follow up visit... she has mult med problems as noted below ...   ~  sees DrPerry for GI- divertics, polyps, hems & a bout of diverticulitis in Mar08...  ~  sees Dr [name] Caprice Urology w/ microscopic hematuria and a neg eval 5/08... 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 ~  sees DrLLomax for Derm w/ alopecia- offered Rogaine but decided against it...  ~  Jan11:  she is still concerned our "lump" in her right side/ flank area= mod sized lipoma w/ prev check by [facility], DrLeone w/ rec for no surg... she is anxious about it & we discussed getting a second opinion about excision from [facility] to compare notes (saw DrCornett who offered surg if she wants)... BP remains controlled on meds, Chol has been good on the Simva20, & TFT's normal off the prev Synthroid Rx... ~  Jul11:  she increased her Lisinopril from 20mg  to 30mg  on her own when home BP checks inched up above 150sys... she notes some heel pain & we discussed soaks & stretching exercises... she remains on diet & exerc program- weight down 4# & due for f/u labs==> all look good.  ~  July 19, 2010:  she raised a question about her Simva20 & the data on DM> her BS's have been normal- no signs of DM & her A1c's all ~6, we will continue to monitor... notes some LBP after recent trip to [place]> offered XRay & meds but she'll do heat etc & let me know... BP controlled on meds;  Chol looks good on the Simva20;  Thyroid stable off med;  F/u BMD done 7/11 showed osteopenia in Spine on Calcium, MVI, Vit D & bisphos drug holiday since 7/11...  ~  January 23, 2011:  32mo ROV & she notes some mild right knee pain but declines Rx for anti-inflamm Rx & she will use OTC Aleve as needed;  She plays "[employer]" a game somewhere in [place] tennis & badmitten using a whiffle ball & small rackets (good exercise); BP stable on meds & she  denies CP, palpit, SOB, edema, etc;  Lipids have been good on diet + Simva20;  BS doing very well on diet alone;  Ortho stable & she is 25yr into Bisphos drug holiday & reminded to continue the Calcium, MVI, Vit D supplements...   Problem List:  MACULAR DEGENERATION (ICD-362.50) - eval by DrHecker w/ foveal telangiectasia, macular drusen, macular degeneration, and cataracts- being followed regularly... vision is preserved so far... ~  11/11:  f/u DrHecker- stable age related mac degen, no retinopathy.  HYPERTENSION (ICD-401.9) - on VERAPAMIL 240mg /d,  LISINOPRIL 20mg - taking 1.5tabs/d, and diet therapy... BP= 140/68 today... she feels well and takes meds regularly... denies HA, fatigue, visual changes, CP, palipit, dizziness, syncope, dyspnea, edema, etc... prev Rx'd for swelling w/ Dyazide but she got on a low sodium diet & was able to DC the diuretic... ~  8/10: borderline BP here and she will monitor at home carefully & call if not <150/90. ~  7/11: she reports that she incr the Lisinopril to 1&1/2 tabs due to BP~150+, now better... ~  7/12:  BP stable on Verap240 & Lisinopril 30mg /d...  DYSLIPIDEMIA (ICD-272.4) - on ZOCOR 20mg /d... plus doing well on diet... ~  FLP 6/09 showed TChol 144, TG 140, HDL 39, LDL 77.Marland KitchenMarland Kitchen  continue same. ~  FLP 2/10 showed TChol 164, TG 77, HDL 54, LDL 95 ~  FLP 8/10 showed TChol 138, TG 51, HDL 48, LDL 80 ~  FLP 1/11 showed TChol 155, TG 100, HDL 48, LDL 87 ~  FLP 7/11 showed TChol 158, TG 74, HDL 55, LDL 89 ~  FLP 1/12 showed TChol 155, TG 101, HDL 50, LDL 85  DIABETES MELLITUS, BORDERLINE (ICD-790.29) - on diet alone... FBS @ home betw 120-130 recently... she is on a good diet and weight is down at 139# today (63"  BMI= 24)... ~  labs 3/09 showed FBS= 119... subseq HgA1c 5/09 was 6.4... diet Rx. ~  labs 8/09 showed BS= 106, HgA1c= 5.9.Marland KitchenMarland Kitchen rec- same. ~  labs 2/10 showed BS= 102, A1c= 5.9 ~  labs 8/10 showed BS= 103, A1c= 5.9 ~  labs 1/11 showed BS= 98 ~  labs  7/11 showed BS= 89, A1c= 6.0 ~  labs 1/12 showed BS= 95  HYPOTHYROIDISM (ICD-244.9) - off SYNTHROID now...  clinically & biochemically euthyroid... remote hx of overactive thyroid many yrs ago in [place] (we don't have records)... she had an eval by DrKohut in 1987 w/ familial multinodular goiter w/ neg FNA... started on Rx but goiter enlarged- had thyroidectomy 1990 by DrWeatherly... ~  labs 3/09 showed TSH = 0.35 (0.35-5.5)... we have slowly decr her dose to 49mcg/d... ~  labs 8/09 showed TSH= 0.33... rec- decr the Synth50 to 1/2 tab daily. ~  labs 2/10 showed =TSH= 0.54 and Synthroid stopped... ~  labs 4/10 off med showed TSH= 0.59 ~  labs 8/10 showed TSH= 0.60 ~  labs 1/11 showed TSH= 0.60 ~  labs 7/11 showed TSH= 0.56 ~  labs 1/12 showed TSH= 0.60  DIVERTICULOSIS OF COLON (ICD-562.10) COLONIC POLYPS (ICD-211.3) - followed by DrPerry... bout of diverticulitis in Mar08... ~  last colonoscopy 3/08 showed divertics, 2mm polyp, hems... path=hyperplastic, f/u planned 77yrs.  HEMATURIA, HX OF (ICD-V13.09) - eval 4/08 by DrGrapey w/ neg Sonar, Cysto, cytology...  DEGENERATIVE JOINT DISEASE (ICD-715.90) - c/o discomfort in her shoulders and knees... uses OTC meds as needed.  Hx of OSTEOPOROSIS (ICD-733.00) - on Fosamax 70/wk thru 7/11, Caltrate 1/d, MVI, Vit D 1000/d... ~  initial BMD 10/01 showed TScores -0.2  to -3.2 in the spine...  ~  f/u studies w/ steady improvement on Fosamax therapy... ~  BMD 3/09 w/ TScores -0.1 to -1.7 in the spine... continue Rx. ~  BMD 7/11 showed TScores -2.0 Spine, and -0.3 in right FemNeck... we opted for Bisphos drug holiday to start now.  ? of MEMORY LOSS (ICD-780.93) - MMSE 5/09 by Shanon Brow, NP was 29/30... reassured.  ANXIETY (ICD-300.00) - prev on Alpraz Prn but hasn't used in yrs...  LIPOMA (ICD-214.9) - notes large lipoma in RUQ/ rt flank area... some discomfort noted... seen by DrLeone in the past w/o surg... she saw DrCornett, [facility], 1/11 for second opinion  & she will decide re: excision...  SHINGLES - remote hx of right T8-9 shingles in 1986...   No past surgical history on file.   Outpatient Encounter Prescriptions as of 01/23/2011  Medication Sig Dispense Refill  . lisinopril (PRINIVIL,ZESTRIL) 20 MG tablet Take 1 1/2 tablet by mouth daily  135 tablet  3  . simvastatin (ZOCOR) 20 MG tablet Take 1 tablet (20 mg total) by mouth at bedtime.  90 tablet  3  . verapamil (VERELAN PM) 240 MG 24 hr capsule Take one capsule by mouth one time a day  90 capsule  3    Allergies  Allergen Reactions  . Epinephrine     REACTION: heart racing    Review of Systems        See HPI - all other systems neg except as noted...  The patient complains of dyspnea on exertion.  The patient denies anorexia, fever, weight loss, weight gain, vision loss, decreased hearing, hoarseness, chest pain, syncope, peripheral edema, prolonged cough, headaches, hemoptysis, abdominal pain, melena, hematochezia, severe indigestion/heartburn, hematuria, incontinence, muscle weakness, suspicious skin lesions, transient blindness, difficulty walking, depression, unusual weight change, abnormal bleeding, enlarged lymph nodes, and angioedema.   Objective:   Physical Exam    WD, WN, 77 y/o WF in NAD... VITAL SIGNS:  Reviewed... GENERAL:  Alert & oriented; pleasant & cooperative... HEENT:  [place]/AT, EOM-wnl, PERRLA, EACs-clear, TMs-wnl, NOSE-clear, THROAT-clear & wnl. NECK:  Supple w/ fair ROM; no JVD; normal carotid impulses w/o bruits; scar of prev thyroid surg- no nodules or goiter; no lymphadenopathy. CHEST:  Clear to P & A; without wheezes/ rales/ or rhonchi. HEART:  Regular Rhythm; without murmurs/ rubs/ or gallops. ABDOMEN:  Soft & nontender; normal bowel sounds; no organomegaly or masses detected. EXT: without deformities, mild arthritic changes; no varicose veins/ venous insuffic/ or edema. prob heel spurs w/ some plantar fascia pain> advised soaks & stretching... NEURO:   CN's intact;  no focal neuro deficits... DERM:  Lipoma in RUQ/ right flank area... no other lesions noted...   Assessment & Plan:   Macular Degen>  Followed by DrHecker & stable per pt hx...  HBP>  Controlled on Verap & Lisinopril, continue same...  DYSLIPIDEMIA>  Stable on diet & Simva20...  Borderline DM>  Hx BS in the 120-130 range yrs ago but stable ~100 for the last 3+yrs on diet alone...  Hx Hypothyroidism>  She weaned off Synthroid several yrs ago& TSH has remained normal, clinically euthyroid as well...  GI>  Divertics, Colon Polyps>  Stable & up to date w/ f/u colon due 3/13 from DrPerry...  DJD>  She notes some right knee discomfort playing "pickle ball" & is advised to wear knee brace, take Aleve prn (she declines further eval or prescription meds)...  Osteoporosis>  Hx BMD in 2001 w/ TScore =3.2 in Spine; Rx'd Fosamax for 71yrs w/ steady improvement & on Bisphos drug holiday since 7/11 (plan is to repeat BMD 7/13); rec to continue supplements w/ Calcium, MVI, Vit D...  Other medical problems as listed.Marland KitchenMarland Kitchen

## 2011-01-23 NOTE — Patient Instructions (Signed)
Today we updated your med list on EPIC...    We refilled your meds for 90d supplies...  Keep up the good work w/ your diet efforts and exercise program...  Call for any questions...  Lewt's plan a follow up visit in 6 months w/ FASTING blood work & a  F/u CXR at that time.Marland KitchenMarland Kitchen

## 2011-01-24 ENCOUNTER — Encounter: Payer: Self-pay | Admitting: Pulmonary Disease

## 2011-07-31 ENCOUNTER — Other Ambulatory Visit (INDEPENDENT_AMBULATORY_CARE_PROVIDER_SITE_OTHER): Payer: Medicare Other

## 2011-07-31 ENCOUNTER — Ambulatory Visit (INDEPENDENT_AMBULATORY_CARE_PROVIDER_SITE_OTHER): Payer: Medicare Other | Admitting: Pulmonary Disease

## 2011-07-31 ENCOUNTER — Ambulatory Visit (INDEPENDENT_AMBULATORY_CARE_PROVIDER_SITE_OTHER)
Admission: RE | Admit: 2011-07-31 | Discharge: 2011-07-31 | Disposition: A | Discharge status: Home / Self Care | Payer: Medicare Other | Source: Ambulatory Visit | Attending: Pulmonary Disease | Admitting: Pulmonary Disease

## 2011-07-31 ENCOUNTER — Encounter: Payer: Self-pay | Admitting: Pulmonary Disease

## 2011-07-31 VITALS — BP 150/80 | HR 65 | Temp 97.1°F | Ht 63.0 in | Wt 138.2 lb

## 2011-07-31 DIAGNOSIS — H3530 Unspecified macular degeneration: Secondary | ICD-10-CM

## 2011-07-31 DIAGNOSIS — K5730 Diverticulosis of large intestine without perforation or abscess without bleeding: Secondary | ICD-10-CM

## 2011-07-31 DIAGNOSIS — D179 Benign lipomatous neoplasm, unspecified: Secondary | ICD-10-CM

## 2011-07-31 DIAGNOSIS — M810 Age-related osteoporosis without current pathological fracture: Secondary | ICD-10-CM

## 2011-07-31 DIAGNOSIS — D126 Benign neoplasm of colon, unspecified: Secondary | ICD-10-CM

## 2011-07-31 DIAGNOSIS — E039 Hypothyroidism, unspecified: Secondary | ICD-10-CM

## 2011-07-31 DIAGNOSIS — F411 Generalized anxiety disorder: Secondary | ICD-10-CM

## 2011-07-31 DIAGNOSIS — J209 Acute bronchitis, unspecified: Secondary | ICD-10-CM

## 2011-07-31 DIAGNOSIS — I10 Essential (primary) hypertension: Secondary | ICD-10-CM

## 2011-07-31 DIAGNOSIS — E785 Hyperlipidemia, unspecified: Secondary | ICD-10-CM

## 2011-07-31 DIAGNOSIS — M1990 Unspecified osteoarthritis, unspecified site: Secondary | ICD-10-CM

## 2011-07-31 LAB — CBC WITH DIFFERENTIAL/PLATELET
Basophils Absolute: 0.1 10*3/uL (ref 0.0–0.1)
HCT: 44.6 % (ref 36.0–46.0)
Hemoglobin: 15.2 g/dL — ABNORMAL HIGH (ref 12.0–15.0)
Lymphs Abs: 2.9 10*3/uL (ref 0.7–4.0)
MCHC: 34.1 g/dL (ref 30.0–36.0)
MCV: 90.9 fl (ref 78.0–100.0)
Monocytes Absolute: 0.6 10*3/uL (ref 0.1–1.0)
Monocytes Relative: 5.2 % (ref 3.0–12.0)
Neutro Abs: 6.9 10*3/uL (ref 1.4–7.7)
Platelets: 230 10*3/uL (ref 150.0–400.0)
RDW: 12.9 % (ref 11.5–14.6)

## 2011-07-31 LAB — HEPATIC FUNCTION PANEL
AST: 21 U/L (ref 0–37)
Alkaline Phosphatase: 57 U/L (ref 39–117)
Bilirubin, Direct: 0.1 mg/dL (ref 0.0–0.3)
Total Bilirubin: 0.7 mg/dL (ref 0.3–1.2)

## 2011-07-31 LAB — BASIC METABOLIC PANEL
Calcium: 9.5 mg/dL (ref 8.4–10.5)
GFR: 64.57 mL/min (ref >60.00)
Glucose, Bld: 101 mg/dL — ABNORMAL HIGH (ref 70–99)
Potassium: 5.8 mEq/L — ABNORMAL HIGH (ref 3.5–5.1)
Sodium: 144 mEq/L (ref 135–145)

## 2011-07-31 LAB — TSH: TSH: 0.58 u[IU]/mL (ref 0.35–5.50)

## 2011-07-31 LAB — LIPID PANEL
HDL: 56.1 mg/dL (ref >39.00)
Total CHOL/HDL Ratio: 3
VLDL: 23.6 mg/dL (ref 0.0–40.0)

## 2011-07-31 MED ORDER — LISINOPRIL 20 MG PO TABS: ORAL_TABLET | ORAL | Status: DC

## 2011-07-31 MED ORDER — SIMVASTATIN 20 MG PO TABS: 20.0000 mg | ORAL_TABLET | Freq: Every day | ORAL | Status: DC

## 2011-07-31 MED ORDER — AZITHROMYCIN 250 MG PO TABS: ORAL_TABLET | ORAL | Status: AC

## 2011-07-31 MED ORDER — VERAPAMIL HCL ER 240 MG PO CP24: 240.0000 mg | ORAL_CAPSULE | Freq: Every day | ORAL | Status: DC

## 2011-07-31 NOTE — Progress Notes (Signed)
Subjective:    Patient ID: Cynthia Mathis, female    DOB: 14-Mar-1933, 78 y.o.   MRN: [id number]  HPI 78 y/o WF here for a follow up visit... she has mult med problems as noted below ...   ~  sees DrPerry for GI- divertics, polyps, hems & a bout of diverticulitis in Mar08...  ~  sees Dr [name] Caprice Urology w/ microscopic hematuria and a neg eval 5/08... 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 ~  sees DrLLomax for Derm w/ alopecia- offered Rogaine but decided against it...  ~  Jan11:  she is still concerned our "lump" in her right side/ flank area= mod sized lipoma w/ prev check by [facility], DrLeone w/ rec for no surg... she is anxious about it & we discussed getting a second opinion about excision from [facility] to compare notes (saw DrCornett who offered surg if she wants)... BP remains controlled on meds, Chol has been good on the Simva20, & TFT's normal off the prev Synthroid Rx... ~  Jul11:  she increased her Lisinopril from 20mg  to 30mg  on her own when home BP checks inched up above 150sys... she notes some heel pain & we discussed soaks & stretching exercises... she remains on diet & exerc program- weight down 4# & due for f/u labs==> all look good.  ~  July 19, 2010:  she raised a question about her Simva20 & the data on DM> her BS's have been normal- no signs of DM & her A1c's all ~6, we will continue to monitor... notes some LBP after recent trip to [place]> offered XRay & meds but she'll do heat etc & let me know... BP controlled on meds;  Chol looks good on the Simva20;  Thyroid stable off med;  F/u BMD done 7/11 showed osteopenia in Spine on Calcium, MVI, Vit D & bisphos drug holiday since 7/11...  ~  January 23, 2011:  79mo ROV & she notes some mild right knee pain but declines Rx for anti-inflamm Rx & she will use OTC Aleve as needed;  She plays "[employer]" a game somewhere [place] & badmitten using a whiffle ball & small rackets (good exercise); BP stable on meds & she  denies CP, palpit, SOB, edema, etc;  Lipids have been good on diet + Simva20;  BS doing very well on diet alone;  Ortho stable & she is 69yr into Bisphos drug holiday & reminded to continue the Calcium, MVI, Vit D supplements...  ~  July 31, 2011:  79mo ROV & she has had a recent URI w/ cough, yellow sputum, no hemoptysis, sore chest & abd from the cough, but no f/c/s;  We discussed further eval w/ CXR, labs, and treatment w/ ZPak, Mucinex, Fluids, Delsym, etc... On Lisinopril20- taking 1.5tabs daily + Verelan240; BP= 150/80 & she is reminded to avoid sodium & take meds every day... On Simva20 for Chol w/ good control; FLP today shows TChol 169, TG 118, HDL 56, LDL 89 Hx Thyroid dis (see below) & clinically euthyroid off meds; TSH today = 0.58 Hx Divertics/ polyp & is sched for 63yr f/u colon 3/13 but her polyp was hyperplastic & DrPerry may change his mind... She has DJD & Osteoporosis; using OTC analgesics as needed & currently in the second year of a Bisphos drug holiday... She has signif macular degen followed by DrHecker & Rankin, on eye vits... Due for 1st of the yr FASTING blood work today> all essent wnl.Marland KitchenMarland Kitchen  Problem List:  MACULAR DEGENERATION (ICD-362.50) - eval by DrHecker w/ foveal telangiectasia, macular drusen, macular degeneration, and cataracts- being followed regularly... vision is preserved so far... ~  11/11:  f/u DrHecker- stable age related mac degen, no retinopathy. ~  11/12: f/u DrHecker- has advanced bilat AMD, sent to DrRankin, we don't have his notes...  HYPERTENSION (ICD-401.9) - on VERAPAMIL 240mg /d,  LISINOPRIL 20mg - taking 1.5tabs/d, and diet therapy... prev Rx'd for swelling w/ Dyazide but she got on a low sodium diet & was able to DC the diuretic. ~  8/10: borderline BP here and she will monitor at home carefully & call if not <150/90. ~  7/11: she reports that she incr the Lisinopril to 1&1/2 tabs due to BP~150+, now better... ~  7/12:  BP stable on  Verap240 & Lisinopril 30mg /d> BP= 140/68 today> denies HA, fatigue, visual changes, CP, palipit, dizziness, syncope, dyspnea, edema, etc... ~  1/13:  BP= 160/90 (didn't take med today); states BP are ok at home; continue same meds + low sodium etc... ~  CXR 1/13 showed norm heart size, clear lungs, mild thor spondylosis, NAD...  DYSLIPIDEMIA (ICD-272.4) - on ZOCOR 20mg /d... plus doing well on diet... ~  FLP 6/09 showed TChol 144, TG 140, HDL 39, LDL 77... continue same. ~  FLP 2/10 showed TChol 164, TG 77, HDL 54, LDL 95 ~  FLP 8/10 showed TChol 138, TG 51, HDL 48, LDL 80 ~  FLP 1/11 showed TChol 155, TG 100, HDL 48, LDL 87 ~  FLP 7/11 showed TChol 158, TG 74, HDL 55, LDL 89 ~  FLP 1/12 showed TChol 155, TG 101, HDL 50, LDL 85 ~  FLP 1/13 on Simva20 showed TChol 169, TG 118, HDL 56, LDL 89   DIABETES MELLITUS, BORDERLINE (ICD-790.29) - on diet alone... FBS @ home betw 120-130 recently... she is on a good diet and weight is down at 139# today (63"  BMI= 24)... ~  labs 3/09 showed FBS= 119... subseq HgA1c 5/09 was 6.4... diet Rx. ~  labs 8/09 showed BS= 106, HgA1c= 5.9.Marland KitchenMarland Kitchen rec- same. ~  labs 2/10 showed BS= 102, A1c= 5.9 ~  labs 1/11 showed BS= 98 ~  labs 1/12 showed BS= 95 ~  Labs 1/13 showed BS= 101  HYPOTHYROIDISM (ICD-244.9) - off SYNTHROID now...  clinically & biochemically euthyroid... remote hx of overactive thyroid many yrs ago in [place] (we don't have records)... she had an eval by DrKohut in 1987 w/ familial multinodular goiter w/ neg FNA... started on Rx but goiter enlarged- had thyroidectomy 1990 by DrWeatherly... ~  labs 3/09 showed TSH = 0.35 (0.35-5.5)... we have slowly decr her dose to 29mcg/d... ~  labs 8/09 showed TSH= 0.33... rec- decr the Synth50 to 1/2 tab daily. ~  labs 2/10 showed =TSH= 0.54 and Synthroid stopped... ~  labs 4/10 off med showed TSH= 0.59 ~  labs 8/10 showed TSH= 0.60 ~  labs 1/11 showed TSH= 0.60 ~  labs 7/11 showed TSH= 0.56 ~  labs 1/12 showed TSH=  0.60 ~  Labs 1/13 showed TSH= 0.58  DIVERTICULOSIS OF COLON (ICD-562.10) COLONIC POLYPS (ICD-211.3) - followed by DrPerry... bout of diverticulitis in Mar08... ~  last colonoscopy 3/08 showed divertics, 2mm polyp, hems... path=hyperplastic, f/u planned 61yrs.  HEMATURIA, HX OF (ICD-V13.09) - eval 4/08 by DrGrapey w/ neg Sonar, Cysto, cytology...  DEGENERATIVE JOINT DISEASE (ICD-715.90) - c/o discomfort in her shoulders and knees... uses OTC meds as needed.  Hx of OSTEOPOROSIS (ICD-733.00) - on Fosamax 70/wk  thru 7/11, Caltrate 1/d, MVI, Vit D 1000/d... ~  initial BMD 10/01 showed TScores -0.2  to -3.2 in the spine...  ~  f/u studies w/ steady improvement on Fosamax therapy... ~  BMD 3/09 w/ TScores -0.1 to -1.7 in the spine... continue Rx. ~  BMD 7/11 showed TScores -2.0 Spine, and -0.3 in right FemNeck... we opted for Bisphos drug holiday to start now. ~  Labs 1/13 showed Vit D level = 48, continue same...  ? of MEMORY LOSS (ICD-780.93) - MMSE 5/09 by Shanon Brow, NP was 29/30... reassured.  ANXIETY (ICD-300.00) - prev on Alpraz Prn but hasn't used in yrs...  LIPOMA (ICD-214.9) - notes large lipoma in RUQ/ rt flank area... some discomfort noted... seen by DrLeone in the past w/o surg... she saw DrCornett, [facility], 1/11 for second opinion & she will decide re: excision...  SHINGLES - remote hx of right T8-9 shingles in 1986...   No past surgical history on file.   Outpatient Encounter Prescriptions as of 07/31/2011  Medication Sig Dispense Refill  . lisinopril (PRINIVIL,ZESTRIL) 20 MG tablet Take 1 1/2 tablet by mouth daily  135 tablet  3  . simvastatin (ZOCOR) 20 MG tablet Take 1 tablet (20 mg total) by mouth at bedtime.  90 tablet  3  . verapamil (VERELAN PM) 240 MG 24 hr capsule Take one capsule by mouth one time a day  90 capsule  3    Allergies  Allergen Reactions  . Epinephrine     REACTION: heart racing    Current Medications, Allergies, Past Medical History, Past Surgical  History, Family History, and Social History were reviewed in [employer] record.    Review of Systems        See HPI - all other systems neg except as noted...  The patient complains of dyspnea on exertion.  The patient denies anorexia, fever, weight loss, weight gain, vision loss, decreased hearing, hoarseness, chest pain, syncope, peripheral edema, prolonged cough, headaches, hemoptysis, abdominal pain, melena, hematochezia, severe indigestion/heartburn, hematuria, incontinence, muscle weakness, suspicious skin lesions, transient blindness, difficulty walking, depression, unusual weight change, abnormal bleeding, enlarged lymph nodes, and angioedema.   Objective:   Physical Exam    WD, WN, 78 y/o WF in NAD... VITAL SIGNS:  Reviewed... GENERAL:  Alert & oriented; pleasant & cooperative... HEENT:  [place]/AT, EOM-wnl, PERRLA, EACs-clear, TMs-wnl, NOSE-clear, THROAT-clear & wnl. NECK:  Supple w/ fair ROM; no JVD; normal carotid impulses w/o bruits; scar of prev thyroid surg- no nodules or goiter; no lymphadenopathy. CHEST:  Clear to P & A; without wheezes/ rales/ or rhonchi. HEART:  Regular Rhythm; without murmurs/ rubs/ or gallops. ABDOMEN:  Soft & nontender; normal bowel sounds; no organomegaly or masses detected. EXT: without deformities, mild arthritic changes; no varicose veins/ venous insuffic/ or edema. prob heel spurs w/ some plantar fascia pain> advised soaks & stretching... NEURO:  CN's intact;  no focal neuro deficits... DERM:  Lipoma in RUQ/ right flank area... no other lesions noted...  RADIOLOGY DATA:  Reviewed in the EPIC EMR & discussed w/ the patient...    >>CXR 1/13 showed norm heart size, clear lungs, mild thor spondylosis, NAD...  LABORATORY DATA:  Reviewed in the EPIC EMR & discussed w/ the patient...    >>LABS 1/13 showed:  FLP-ok on Sima20;  CBC-ok, Chems-ok (not on KCl), TSH-ok, VitD-ok   Assessment & Plan:   Macular Degen>  Followed by  DrHecker/ Rankin & stable per pt hx...  HBP>  Controlled on Verap &  Lisinopril, continue same, take regularly, no salt...  DYSLIPIDEMIA>  Stable on diet & Simva20...  Borderline DM>  Hx BS in the 120-130 range yrs ago but stable ~100 for the last 3+yrs on diet alone...  Hx Hypothyroidism>  She weaned off Synthroid several yrs ago& TSH has remained normal, clinically euthyroid as well...  GI>  Divertics, Colon Polyps>  Stable & up to date w/ ?f/u colon due 3/13 from DrPerry...  DJD>  She notes some right knee discomfort playing "pickle ball" & is advised to wear knee brace, take Aleve prn (she declines further eval or prescription meds)...  Osteoporosis>  Hx BMD in 2001 w/ TScore =3.2 in Spine; Rx'd Fosamax for 55yrs w/ steady improvement & on Bisphos drug holiday since 7/11 (plan is to repeat BMD 7/13); rec to continue supplements w/ Calcium, MVI, Vit D...  Other medical problems as listed.Marland KitchenMarland Kitchen

## 2011-07-31 NOTE — Patient Instructions (Signed)
Today we updated your med list in our EPIC system...    Continue your current medications the same...    We will change the Verelan to generic VERAPAMIL-ER for better insurance coverage...  We added a ZPak to take for your bronchitis...    You may use the OTC MUCINEX tabs (600mg /tab) take 1-2 tabs twice daily w/ lots of fluids...    You may use the OTC DELSYM cough syrup as needed; and OTC Tylenol/ Aleve/ etc if necessary...  Today we did your follow up CXR & fasting blood work...    Please call the [facility] in a few days for your results...    Dial [id number] & when prompted enter your patient number followed by the # symbol...    Your patient number is:  [id number]#  Call for any questions...  Let's plan a mid year check up in 6 months.Marland KitchenMarland Kitchen

## 2011-08-01 ENCOUNTER — Telehealth: Payer: Self-pay | Admitting: Pulmonary Disease

## 2011-08-01 MED ORDER — VERAPAMIL HCL ER 240 MG PO TBCR: 240.0000 mg | EXTENDED_RELEASE_TABLET | Freq: Every day | ORAL | Status: DC

## 2011-08-01 NOTE — Telephone Encounter (Signed)
Per spouse pt needs the tablet form of verapamil ER 240 mg in order to get it for $6. The rx sent on 07/31/11 was for capsule form. I advised will send new rx for pt. Nothing further was needed

## 2011-08-03 ENCOUNTER — Encounter: Payer: Self-pay | Admitting: Pulmonary Disease

## 2011-08-11 ENCOUNTER — Encounter: Payer: Self-pay | Admitting: Internal Medicine

## 2011-08-18 ENCOUNTER — Encounter: Payer: Self-pay | Admitting: Internal Medicine

## 2011-09-27 ENCOUNTER — Encounter: Payer: Self-pay | Admitting: Internal Medicine

## 2011-09-27 ENCOUNTER — Ambulatory Visit (AMBULATORY_SURGERY_CENTER): Payer: Medicare Other | Admitting: *Deleted

## 2011-09-27 VITALS — Ht 63.0 in | Wt 140.8 lb

## 2011-09-27 DIAGNOSIS — Z1211 Encounter for screening for malignant neoplasm of colon: Secondary | ICD-10-CM

## 2011-09-27 MED ORDER — PEG-KCL-NACL-NASULF-NA ASC-C 100 G PO SOLR: ORAL | Status: DC

## 2011-10-18 ENCOUNTER — Ambulatory Visit (AMBULATORY_SURGERY_CENTER): Payer: Medicare Other | Admitting: Internal Medicine

## 2011-10-18 ENCOUNTER — Encounter: Payer: Self-pay | Admitting: Internal Medicine

## 2011-10-18 VITALS — BP 126/98 | HR 72 | Temp 98.8°F | Resp 15 | Ht 63.0 in | Wt 140.0 lb

## 2011-10-18 DIAGNOSIS — Z1211 Encounter for screening for malignant neoplasm of colon: Secondary | ICD-10-CM

## 2011-10-18 DIAGNOSIS — Z86010 Personal history of colonic polyps: Secondary | ICD-10-CM

## 2011-10-18 MED ORDER — SODIUM CHLORIDE 0.9 % IV SOLN: 500.0000 mL | INTRAVENOUS | Status: DC

## 2011-10-18 NOTE — Patient Instructions (Signed)
YOU HAD AN ENDOSCOPIC PROCEDURE TODAY AT THE [facility]: Refer to the procedure report that was given to you for any specific questions about what was found during the examination.  If the procedure report does not answer your questions, please call your gastroenterologist to clarify.  If you requested that your care partner not be given the details of your procedure findings, then the procedure report has been included in a sealed envelope for you to review at your convenience later.  YOU SHOULD EXPECT: Some feelings of bloating in the abdomen. Passage of more gas than usual.  Walking can help get rid of the air that was put into your GI tract during the procedure and reduce the bloating. If you had a lower endoscopy (such as a colonoscopy or flexible sigmoidoscopy) you may notice spotting of blood in your stool or on the toilet paper. If you underwent a bowel prep for your procedure, then you may not have a normal bowel movement for a few days.  DIET: Your first meal following the procedure should be a light meal and then it is ok to progress to your normal diet.  A half-sandwich or bowl of soup is an example of a good first meal.  Heavy or fried foods are harder to digest and may make you feel nauseous or bloated.  Likewise meals heavy in dairy and vegetables can cause extra gas to form and this can also increase the bloating.  Drink plenty of fluids but you should avoid alcoholic beverages for 24 hours.  ACTIVITY: Your care partner should take you home directly after the procedure.  You should plan to take it easy, moving slowly for the rest of the day.  You can resume normal activity the day after the procedure however you should NOT DRIVE or use heavy machinery for 24 hours (because of the sedation medicines used during the test).    SYMPTOMS TO REPORT IMMEDIATELY: A gastroenterologist can be reached at any hour.  During normal business hours, 8:30 AM to 5:00 PM Monday through Friday,  call [phone].  After hours and on weekends, please call the GI answering service at [phone] who will take a message and have the physician on call contact you.   Following lower endoscopy (colonoscopy or flexible sigmoidoscopy):  Excessive amounts of blood in the stool  Significant tenderness or worsening of abdominal pains  Swelling of the abdomen that is new, acute  Fever of 100F or higher  Following upper endoscopy (EGD)  Vomiting of blood or coffee ground material  New chest pain or pain under the shoulder blades  Painful or persistently difficult swallowing  New shortness of breath  Fever of 100F or higher  Black, tarry-looking stools  FOLLOW UP: If any biopsies were taken you will be contacted by phone or by letter within the next 1-3 weeks.  Call your gastroenterologist if you have not heard about the biopsies in 3 weeks.  Our staff will call the home number listed on your records the next business day following your procedure to check on you and address any questions or concerns that you may have at that time regarding the information given to you following your procedure. This is a courtesy call and so if there is no answer at the home number and we have not heard from you through the emergency physician on call, we will assume that you have returned to your regular daily activities without incident.  SIGNATURES/CONFIDENTIALITY: You and/or your care   partner have signed paperwork which will be entered into your electronic medical record.  These signatures attest to the fact that that the information above on your After Visit Summary has been reviewed and is understood.  Full responsibility of the confidentiality of this discharge information lies with you and/or your care-partner.  

## 2011-10-18 NOTE — Progress Notes (Signed)
Patient did not experience any of the following events: a burn prior to discharge; a fall within the facility; wrong site/side/patient/procedure/implant event; or a hospital transfer or hospital admission upon discharge from the facility. (G8907) Patient did not have preoperative order for IV antibiotic SSI prophylaxis. (G8918)  

## 2011-10-18 NOTE — Op Note (Signed)
[facility] [address] [place], [place]  [address]  COLONOSCOPY PROCEDURE REPORT  PATIENT:  Erinne, Gillentine  MR#:  [id number] BIRTHDATE:  Sep 29, 1932, 79 yrs. old  GENDER:  female ENDOSCOPIST:  Wilhemina Bonito. Eda Keys, MD REF. BY:  Surveillance Program Recall, PROCEDURE DATE:  10/18/2011 PROCEDURE:  Surveillance Colonoscopy ASA CLASS:  Class II INDICATIONS:  history of pre-cancerous (adenomatous) colon polyps, surveillance and high-risk screening ; prior exams 2001, 05, 08 MEDICATIONS:   MAC sedation, administered by CRNA, propofol (Diprivan) 200 mg IV  DESCRIPTION OF PROCEDURE:   After the risks benefits and alternatives of the procedure were thoroughly explained, informed consent was obtained.  Digital rectal exam was performed and revealed no abnormalities.   The LB [id number]-[id number] endoscope was introduced through the anus and advanced to the cecum, which was identified by both the appendix and ileocecal valve, limited by diverticulosis, severe.    The quality of the prep was good, using MoviPrep.  The instrument was then slowly withdrawn as the colon was fully examined. <<PROCEDUREIMAGES>>  FINDINGS:  Severe diverticulosis was found throughout the colon. Otherwise normal colonoscopy without  polyps, masses, vascular ectasias, or inflammatory changes.   Retroflexed views in the rectum revealed internal hemorrhoids.  The time to cecum =  5:59 minutes. The scope was then withdrawn in  9:40  minutes from the cecum and the procedure completed.  COMPLICATIONS:  None  ENDOSCOPIC IMPRESSION: 1) Severe diverticulosis throughout the colon 2) Otherwise normal colonoscopy 3) No polyps or cancers  RECOMMENDATIONS: 1) Return to the care of your primary provider. GI follow up as needed  ______________________________ Wilhemina Bonito. Eda Keys, MD  CC:  Michele Mcalpine, MD;  The Patient  n. eSIGNED:   Wilhemina Bonito. Eda Keys at 10/18/2011 11:02 AM  [place], [place], [phone]

## 2011-10-19 ENCOUNTER — Telehealth: Payer: Self-pay | Admitting: *Deleted

## 2011-10-19 NOTE — Telephone Encounter (Signed)
  Follow up Call-  Call back number 10/18/2011  Post procedure Call Back phone  # [phone]  Permission to leave phone message Yes     Patient questions:  Do you have a fever, pain , or abdominal swelling? no Pain Score  0 *  Have you tolerated food without any problems? yes  Have you been able to return to your normal activities? yes  Do you have any questions about your discharge instructions: Diet   no Medications  no Follow up visit  no  Do you have questions or concerns about your Care? no  Actions: * If pain score is 4 or above: No action needed, pain <4.

## 2011-11-10 ENCOUNTER — Other Ambulatory Visit: Payer: Self-pay | Admitting: Dermatology

## 2012-01-29 ENCOUNTER — Ambulatory Visit (INDEPENDENT_AMBULATORY_CARE_PROVIDER_SITE_OTHER): Payer: Medicare Other | Admitting: Pulmonary Disease

## 2012-01-29 ENCOUNTER — Encounter: Payer: Self-pay | Admitting: Pulmonary Disease

## 2012-01-29 VITALS — BP 152/82 | HR 65 | Temp 97.6°F | Ht 62.0 in | Wt 138.2 lb

## 2012-01-29 DIAGNOSIS — I10 Essential (primary) hypertension: Secondary | ICD-10-CM

## 2012-01-29 DIAGNOSIS — D179 Benign lipomatous neoplasm, unspecified: Secondary | ICD-10-CM

## 2012-01-29 DIAGNOSIS — E785 Hyperlipidemia, unspecified: Secondary | ICD-10-CM

## 2012-01-29 DIAGNOSIS — M810 Age-related osteoporosis without current pathological fracture: Secondary | ICD-10-CM

## 2012-01-29 DIAGNOSIS — M1990 Unspecified osteoarthritis, unspecified site: Secondary | ICD-10-CM

## 2012-01-29 DIAGNOSIS — D126 Benign neoplasm of colon, unspecified: Secondary | ICD-10-CM

## 2012-01-29 DIAGNOSIS — K5730 Diverticulosis of large intestine without perforation or abscess without bleeding: Secondary | ICD-10-CM

## 2012-01-29 MED ORDER — LOSARTAN POTASSIUM 100 MG PO TABS: 100.0000 mg | ORAL_TABLET | Freq: Every day | ORAL | Status: DC

## 2012-01-29 NOTE — Progress Notes (Signed)
Subjective:    Patient ID: Cynthia Mathis, female    DOB: October 20, 1932, 78 y.o.   MRN: [id number]  HPI 78 y/o WF here for a follow up visit... she has mult med problems as noted below ...   ~  sees DrPerry for GI- divertics, polyps, hems & a bout of diverticulitis in Mar08...  ~  sees Dr [name] Caprice Urology w/ microscopic hematuria and a neg eval 5/08... 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 ~  sees DrLLomax for Derm w/ alopecia- offered Rogaine but decided against it...  ~  Jan11:  she is still concerned our "lump" in her right side/ flank area= mod sized lipoma w/ prev check by [facility], DrLeone w/ rec for no surg... she is anxious about it & we discussed getting a second opinion about excision from [facility] to compare notes (saw DrCornett who offered surg if she wants)... BP remains controlled on meds, Chol has been good on the Simva20, & TFT's normal off the prev Synthroid Rx... ~  Jul11:  she increased her Lisinopril from 20mg  to 30mg  on her own when home BP checks inched up above 150sys... she notes some heel pain & we discussed soaks & stretching exercises... she remains on diet & exerc program- weight down 4# & due for f/u labs==> all look good.  ~  July 19, 2010:  she raised a question about her Simva20 & the data on DM> her BS's have been normal- no signs of DM & her A1c's all ~6, we will continue to monitor... notes some LBP after recent trip to [place]> offered XRay & meds but she'll do heat etc & let me know... BP controlled on meds;  Chol looks good on the Simva20;  Thyroid stable off med;  F/u BMD done 7/11 showed osteopenia in Spine on Calcium, MVI, Vit D & bisphos drug holiday since 7/11...  ~  January 23, 2011:  34mo ROV & she notes some mild right knee pain but declines Rx for anti-inflamm Rx & she will use OTC Aleve as needed;  She plays "[employer]" a game somewhere in betw tennis & badmitten using a whiffle ball & small rackets (good exercise); BP stable on meds & she  denies CP, palpit, SOB, edema, etc;  Lipids have been good on diet + Simva20;  BS doing very well on diet alone;  Ortho stable & she is 64yr into Bisphos drug holiday & reminded to continue the Calcium, MVI, Vit D supplements...  ~  July 31, 2011:  34mo ROV & she has had a recent URI w/ cough, yellow sputum, no hemoptysis, sore chest & abd from the cough, but no f/c/s;  We discussed further eval w/ CXR, labs, and treatment w/ ZPak, Mucinex, Fluids, Delsym, etc... On Lisinopril20- taking 1.5tabs daily + Verelan240; BP= 150/80 & she is reminded to avoid sodium & take meds every day... On Simva20 for Chol w/ good control; FLP today shows TChol 169, TG 118, HDL 56, LDL 89 Hx Thyroid dis (see below) & clinically euthyroid off meds; TSH today = 0.58 Hx Divertics/ polyp & is sched for 62yr f/u colon 3/13 but her polyp was hyperplastic & DrPerry may change his mind... She has DJD & Osteoporosis; using OTC analgesics as needed & currently in the second year of a Bisphos drug holiday... She has signif macular degen followed by DrHecker & Rankin, on eye vits... Due for 1st of the yr FASTING blood work today> all essent wnl...  ~  January 29, 2012:  42mo ROV & [name] reports a good interval> she had a follow up colonoscopy from DrPerry 4/13- severe diverticulosis, but no inflamm change & no polyps seen;  She also had a basal cell skin cancer removed from the left side of her nose by DrLLomax w/ subseq deeper resection & doing satis now;  She reports large lipoma on right flank/ RLQ & she would like to proceed w/ removal by DrCornett, [facility]...    She notes some throat clearing & mild cough that might be related to her ACE?Lisinopril Rx> we discussed this & decided to stop the ACE & change to LOSARTAN 100mg /d along w/ her Verapamil & low salt diet...    We reviewed prob list, meds, xrays and labs> see below for updates >>          Problem List:  MACULAR DEGENERATION (ICD-362.50) - eval by DrHecker w/ foveal  telangiectasia, macular drusen, macular degeneration, and cataracts- being followed regularly... vision is preserved so far... ~  11/11:  f/u DrHecker- stable age related mac degen, no retinopathy. ~  11/12: f/u DrHecker- has advanced bilat AMD, sent to DrRankin, we don't have his notes...  HYPERTENSION (ICD-401.9) - on VERAPAMIL 240mg /d,  LISINOPRIL 20mg - taking 1.5tabs/d, and diet therapy... prev Rx'd for swelling w/ Dyazide but she got on a low sodium diet & was able to DC the diuretic. ~  8/10: borderline BP here and she will monitor at home carefully & call if not <150/90. ~  7/11: she reports that she incr the Lisinopril to 1&1/2 tabs due to BP~150+, now better... ~  7/12:  BP stable on Verap240 & Lisinopril 30mg /d> BP= 140/68 today> denies HA, fatigue, visual changes, CP, palipit, dizziness, syncope, dyspnea, edema, etc... ~  1/13:  BP= 160/90 (didn't take med today); states BP are ok at home; continue same meds + low sodium etc... ~  CXR 1/13 showed norm heart size, clear lungs, mild thor spondylosis, NAD.[name]. ~  7/13:  BP= 152/82 & she notes some troublesome throat clearing & mild cough; we decided to STOP the ACE & switch to LOSARTAN 100mg /d, monitor her symptoms and her blood presure...  DYSLIPIDEMIA (ICD-272.4) - on ZOCOR 20mg /d... plus doing well on diet... ~  FLP 6/09 showed TChol 144, TG 140, HDL 39, LDL 77... continue same. ~  FLP 2/10 showed TChol 164, TG 77, HDL 54, LDL 95 ~  FLP 8/10 showed TChol 138, TG 51, HDL 48, LDL 80 ~  FLP 1/11 showed TChol 155, TG 100, HDL 48, LDL 87 ~  FLP 7/11 showed TChol 158, TG 74, HDL 55, LDL 89 ~  FLP 1/12 showed TChol 155, TG 101, HDL 50, LDL 85 ~  FLP 1/13 on Simva20 showed TChol 169, TG 118, HDL 56, LDL 89   DIABETES MELLITUS, BORDERLINE (ICD-790.29) - on diet alone... FBS @ home betw 120-130 recently... she is on a good diet and weight is down at 139# today (63"  BMI= 24)... ~  labs 3/09 showed FBS= 119... subseq HgA1c 5/09 was 6.4... diet  Rx. ~  labs 8/09 showed BS= 106, HgA1c= 5.9.Marland KitchenMarland Kitchen rec- same. ~  labs 2/10 showed BS= 102, A1c= 5.9 ~  labs 1/11 showed BS= 98 ~  labs 1/12 showed BS= 95 ~  Labs 1/13 showed BS= 101  HYPOTHYROIDISM (ICD-244.9) - off Synthroid now...  clinically & biochemically euthyroid... remote hx of overactive thyroid many yrs ago in [place] (we don't have records)... she had an eval by DrKohut in  50 w/ familial multinodular goiter w/ neg FNA... started on Rx but goiter enlarged- had thyroidectomy 1990 by DrWeatherly... ~  labs 3/09 showed TSH = 0.35 (0.35-5.5)... we have slowly decr her dose to 65mcg/d... ~  labs 8/09 showed TSH= 0.33... rec- decr the Synth50 to 1/2 tab daily. ~  labs 2/10 showed =TSH= 0.54 and Synthroid stopped... ~  labs 4/10 off med showed TSH= 0.59 ~  labs 8/10 showed TSH= 0.60 ~  labs 1/11 showed TSH= 0.60 ~  labs 7/11 showed TSH= 0.56 ~  labs 1/12 showed TSH= 0.60 ~  Labs 1/13 showed TSH= 0.58  DIVERTICULOSIS OF COLON (ICD-562.10) COLONIC POLYPS (ICD-211.3) - followed by DrPerry... bout of diverticulitis in Mar08... ~  last colonoscopy 3/08 showed divertics, 2mm polyp, hems... path=hyperplastic, f/u planned 47yrs. ~  F/u colonoscopy 4/13 by DrPerry showed severe diverticulosis w/o inflamm and w/o polyps etc...  HEMATURIA, HX OF (ICD-V13.09) - eval 4/08 by DrGrapey w/ neg Sonar, Cysto, cytology...  DEGENERATIVE JOINT DISEASE (ICD-715.90) - c/o discomfort in her shoulders and knees... uses OTC meds as needed.  Hx of OSTEOPOROSIS (ICD-733.00) - on Fosamax 70/wk thru 7/11, Caltrate 1/d, MVI, Vit D 1000/d... ~  initial BMD 10/01 showed TScores -0.2  to -3.2 in the spine...  ~  f/u studies w/ steady improvement on Fosamax therapy... ~  BMD 3/09 w/ TScores -0.1 to -1.7 in the spine... continue Rx. ~  BMD 7/11 showed TScores -2.0 Spine, and -0.3 in right FemNeck... we opted for Bisphos drug holiday to start now. ~  Labs 1/13 showed Vit D level = 48, continue same...  ? of MEMORY LOSS  (ICD-780.93) - MMSE 5/09 by Shanon Brow, NP was 29/30... reassured.  ANXIETY (ICD-300.00) - prev on Alpraz Prn but hasn't used in yrs...  DERMATOLOGY >> LIPOMA (ICD-214.9) - notes large lipoma in RLQ/ rt flank area... some discomfort noted... seen by DrLeone in the past w/o surg... she saw DrCornett, [facility], 1/11 for second opinion & she will decide re: excision... ~  5/13:  She had Basal cell ca removed from the left side of her nose, w/ subseq deeper margins & reports everything is OK... ~  7/13:  She is concerned that the lipoma may be growing & she requests f/u DrCornett for excision...  SHINGLES - remote hx of right T8-9 shingles in 1986...   Past Surgical History  Procedure Date  . Thyroidectomy 1990    Dr. Zachery Dakins  . Appendectomy 1947     Outpatient Encounter Prescriptions as of 01/29/2012  Medication Sig Dispense Refill  . aspirin 81 MG tablet Take 81 mg by mouth daily.      . Calcium Carbonate-Vitamin D (CALCIUM 600+D) 600-400 MG-UNIT per tablet Take 1 tablet by mouth daily.      [name] lisinopril (PRINIVIL,ZESTRIL) 20 MG tablet Take 1 1/2 tablet by mouth daily  135 tablet  3  . Multiple Vitamins-Minerals (MULTIVITAMIN & MINERAL PO) Take 1 tablet by mouth daily.      . peg 3350 powder (MOVIPREP) 100 G SOLR moviprep as directed  1 kit  0  . simvastatin (ZOCOR) 20 MG tablet Take 1 tablet (20 mg total) by mouth at bedtime.  90 tablet  3  . verapamil (CALAN-SR) 240 MG CR tablet Take 1 tablet (240 mg total) by mouth at bedtime.  90 tablet  3    Allergies  Allergen Reactions  . Epinephrine     REACTION: heart racing    Current Medications, Allergies, Past Medical History, Past Surgical  History, Family History, and Social History were reviewed in [employer] record.    Review of Systems        See HPI - all other systems neg except as noted...  The patient complains of dyspnea on exertion.  The patient denies anorexia, fever, weight loss, weight gain, vision  loss, decreased hearing, hoarseness, chest pain, syncope, peripheral edema, prolonged cough, headaches, hemoptysis, abdominal pain, melena, hematochezia, severe indigestion/heartburn, hematuria, incontinence, muscle weakness, suspicious skin lesions, transient blindness, difficulty walking, depression, unusual weight change, abnormal bleeding, enlarged lymph nodes, and angioedema.   Objective:   Physical Exam    WD, WN, 78 y/o WF in NAD... VITAL SIGNS:  Reviewed... GENERAL:  Alert & oriented; pleasant & cooperative... HEENT:  [place]/AT, EOM-wnl, PERRLA, EACs-clear, TMs-wnl, NOSE-clear, THROAT-clear & wnl. NECK:  Supple w/ fair ROM; no JVD; normal carotid impulses w/o bruits; scar of prev thyroid surg- no nodules or goiter; no lymphadenopathy. CHEST:  Clear to P & A; without wheezes/ rales/ or rhonchi. HEART:  Regular Rhythm; without murmurs/ rubs/ or gallops. ABDOMEN:  Soft & nontender; normal bowel sounds; no organomegaly or masses detected. EXT: without deformities, mild arthritic changes; no varicose veins/ venous insuffic/ or edema. prob heel spurs w/ some plantar fascia pain> advised soaks & stretching... NEURO:  CN's intact;  no focal neuro deficits... DERM:  Lipoma in RUQ/ right flank area... no other lesions noted...  RADIOLOGY DATA:  Reviewed in the EPIC EMR & discussed w/ the patient...    >>CXR 1/13 showed norm heart size, clear lungs, mild thor spondylosis, NAD...  LABORATORY DATA:  Reviewed in the EPIC EMR & discussed w/ the patient...    >>LABS 1/13 showed:  FLP-ok on Sima20;  CBC-ok, Chems-ok (not on KCl), TSH-ok, VitD-ok   Assessment & Plan:   Macular Degen>  Followed by DrHecker/ Rankin & stable per pt hx...  HBP>  on Verap & Lisinopril, along w/ no salt; we will change the ACE to LOSARTAN & monitor symptoms and BP on the new med...  DYSLIPIDEMIA>  Stable on diet & Simva20...  Borderline DM>  Hx BS in the 120-130 range yrs ago but stable ~100 for the last 3+yrs on diet  alone...  Hx Hypothyroidism>  She weaned off Synthroid several yrs ago & TSH has remained normal, clinically euthyroid as well...  GI>  Divertics, Colon Polyps>  Stable & up to date w/ f/u colon 4/13 from DrPerry==> divertics, no polyps...  LIPOMA>  In right flank area & we will refer back to DrCornett for excision...  DJD>  She notes some right knee discomfort playing "pickle ball" & is advised to wear knee brace, take Aleve prn (she declines further eval or prescription meds)...  Osteoporosis>  Hx BMD in 2001 w/ TScore =3.2 in Spine; Rx'd Fosamax for 20yrs w/ steady improvement & on Bisphos drug holiday since 7/11 (plan is to repeat BMD); rec to continue supplements w/ Calcium, MVI, Vit D...  Other medical problems as listed...   Patient's Medications  New Prescriptions   LOSARTAN (COZAAR) 100 MG TABLET    Take 1 tablet (100 mg total) by mouth daily.  Previous Medications   ASPIRIN 81 MG TABLET    Take 81 mg by mouth daily.   CALCIUM CARBONATE-VITAMIN D (CALCIUM 600+D) 600-400 MG-UNIT PER TABLET    Take 1 tablet by mouth daily.   MULTIPLE VITAMINS-MINERALS (MULTIVITAMIN & MINERAL PO)    Take 1 tablet by mouth daily.   SIMVASTATIN (ZOCOR) 20 MG TABLET  Take 1 tablet (20 mg total) by mouth at bedtime.   VERAPAMIL (CALAN-SR) 240 MG CR TABLET    Take 1 tablet (240 mg total) by mouth at bedtime.  Modified Medications   No medications on file  Discontinued Medications   LISINOPRIL (PRINIVIL,ZESTRIL) 20 MG TABLET    Take 1 1/2 tablet by mouth daily   PEG 3350 POWDER (MOVIPREP) 100 G SOLR    moviprep as directed

## 2012-01-29 NOTE — Patient Instructions (Addendum)
Today we updated your med list in our EPIC system...     We decided to STOP the LISINOPRIL due to your throat clearing 7 cough...    We will substitute LOSARTAN 100mg - one tab daily for your BP...    Continue the Verapamil the same, and remember> NO SALT...  We will arrange for a consultation w/ DrCornett from [facility] to cosider surgery for the Lipoma on your right side...  Call for any questions...  Let's plan a follow up visit in 6months w/ Fasting blood work around that time.Marland KitchenMarland Kitchen

## 2012-02-26 ENCOUNTER — Telehealth: Payer: Self-pay | Admitting: Pulmonary Disease

## 2012-02-26 NOTE — Telephone Encounter (Signed)
Spoke with pt in regards to BP per SN recs. Per SN...  1. Continue on Losartan 100 QD (this is the top dose) 2. No salt in diet 3. Avoid decongestants, caffeine, etc.. 4. Monitor BP and Let us check her when back in town--call us when return 5. If BP increases further, seek attention at Urgent Care or ER  Patient expressed understanding. Will follow up when returns from out of town.

## 2012-02-26 NOTE — Telephone Encounter (Signed)
Called and spoke with pt. She states that she checked her BP yesterday and it was elevated at 182/79. She states that she has been noticing slight temporal pressure and slight change in vision in her rt eye. She has not been able to check BP today and she is still taking her losartan 100 mg daily. She states unable to come in for ov since she is out of town. I advised will get recs from SN and call her back but if symptoms persist, or worsen to seek emergent care. Please advise, thanks! Allergies  Allergen Reactions  . Epinephrine     REACTION: heart racing

## 2012-02-26 NOTE — Telephone Encounter (Signed)
ATC patient-no answer and unable to leave message.  

## 2012-03-05 ENCOUNTER — Telehealth: Payer: Self-pay | Admitting: Pulmonary Disease

## 2012-03-05 NOTE — Telephone Encounter (Signed)
Called and spoke with Cynthia Mathis and she is aware that this appt was for Friday to see Dr. Luisa Hart and Cynthia Mathis was given the number per her request to call and cancel this appt since she is in [place].  Nothing further is needed.

## 2012-03-08 ENCOUNTER — Encounter: Payer: Self-pay | Admitting: Adult Health

## 2012-03-08 ENCOUNTER — Other Ambulatory Visit (INDEPENDENT_AMBULATORY_CARE_PROVIDER_SITE_OTHER): Payer: Medicare Other

## 2012-03-08 ENCOUNTER — Ambulatory Visit (INDEPENDENT_AMBULATORY_CARE_PROVIDER_SITE_OTHER): Payer: Medicare Other | Admitting: Adult Health

## 2012-03-08 ENCOUNTER — Other Ambulatory Visit: Payer: Self-pay | Admitting: Adult Health

## 2012-03-08 ENCOUNTER — Encounter (INDEPENDENT_AMBULATORY_CARE_PROVIDER_SITE_OTHER): Payer: Self-pay | Admitting: Surgery

## 2012-03-08 VITALS — BP 152/88 | HR 69 | Temp 97.1°F | Ht 62.0 in | Wt 135.6 lb

## 2012-03-08 DIAGNOSIS — I10 Essential (primary) hypertension: Secondary | ICD-10-CM

## 2012-03-08 LAB — BASIC METABOLIC PANEL
CO2: 27 mEq/L (ref 19–32)
Chloride: 106 mEq/L (ref 96–112)
Sodium: 140 mEq/L (ref 135–145)

## 2012-03-08 MED ORDER — HYDROCHLOROTHIAZIDE 25 MG PO TABS: 25.0000 mg | ORAL_TABLET | Freq: Every day | ORAL | Status: DC

## 2012-03-08 MED ORDER — LISINOPRIL 40 MG PO TABS: 40.0000 mg | ORAL_TABLET | Freq: Every day | ORAL | Status: DC

## 2012-03-08 NOTE — Assessment & Plan Note (Signed)
Blood pressure, not optimally controlled on present regimen Have suggested adding in hydrochlorothiazide however, patient declined, and was to be placed back on lisinopril. Was explained that lisinopril probably contributed to her chronic cough. However, she wants to try this medication again.  Plan, We'll begin lisinopril 40 mg  (patient previously on 30 mg) , will use higher dose for better control  Advised to avoid nonsteroidals and decongestants. Low salt diet. Lab work with edema and is pending. Followup in 6 weeks

## 2012-03-08 NOTE — Patient Instructions (Addendum)
Restart Lisinopril 40mg  daily  Low salt diet  Avoid decongestants and NSAIDS-advil, aleve Stop Losartan .  follow up in 6 weeks with Dr. Kriste Basque   I will call with labs  Please contact office for sooner follow up if symptoms do not improve or worsen or seek emergency care     Late add:  K+ elevated on labs  Lisinopril stopped along with cozaar  HCTZ rx 25mg  daily sent to pharm.  Repeat bmet on 03/12/12

## 2012-03-08 NOTE — Addendum Note (Signed)
Addended by: Julio Sicks on: 03/08/2012 02:27 PM   Modules accepted: Orders

## 2012-03-08 NOTE — Progress Notes (Signed)
Subjective:    Patient ID: Cynthia Mathis, female    DOB: [birth year], 78 y.o.   MRN: [id number]  HPI 78 y/o WF   ~  sees Cynthia Mathis for GI- divertics, polyps, hems & a bout of diverticulitis in Mar08...  ~  sees Cynthia Mathis Urology w/ microscopic hematuria and a neg eval 5/08... ~  sees Cynthia Mathis for [employer] w/ macular degen- notes reviewed... ~  sees Cynthia Mathis for Derm w/ alopecia- offered Rogaine but decided against it...  ~  Jan11:  she is still concerned our "lump" in her right side/ flank area= mod sized lipoma w/ prev check by Cynthia Mathis, Cynthia Mathis w/ rec for no surg... she is anxious about it & we discussed getting a second opinion about excision from Cynthia Mathis to compare notes (saw Cynthia Mathis who offered surg if she wants)... BP remains controlled on meds, Chol has been good on the Simva20, & TFT's normal off the prev Synthroid Rx... ~  Jul11:  she increased her Cynthia Mathis from 20mg  to 30mg  on her own when home BP checks inched up above 150sys... she notes some heel pain & we discussed soaks & stretching exercises... she remains on diet & exerc program- weight down 4# & due for f/u labs==> all look good.  ~  July 19, 2010:  she raised a question about her Simva20 & the data on DM> her BS's have been normal- no signs of DM & her A1c's all ~6, we will continue to monitor... notes some LBP after recent trip to [place]> offered XRay & meds but she'll do heat etc & let me know... BP controlled on meds;  Chol looks good on the Simva20;  Thyroid stable off med;  F/u BMD done 7/11 showed osteopenia in Spine on Calcium, MVI, Vit D & bisphos drug holiday since 7/11...  ~  January 23, 2011:  50mo ROV & she notes some mild right knee pain but declines Rx for anti-inflamm Rx & she will use OTC Aleve as needed;  She plays "[employer]" a game somewhere in [place] using a whiffle ball & small rackets (good exercise); BP stable on meds & she denies CP, palpit, SOB, edema, etc;  Lipids have been good on diet +  Simva20;  BS doing very well on diet alone;  Ortho stable & she is 40yr into Bisphos drug holiday & reminded to continue the Calcium, MVI, Vit D supplements...  ~  July 31, 2011:  50mo ROV & she has had a recent URI w/ cough, yellow sputum, no hemoptysis, sore chest & abd from the cough, but no f/c/s;  We discussed further eval w/ CXR, labs, and treatment w/ ZPak, Mucinex, Fluids, Delsym, etc... On Lisinopril20- taking 1.5tabs daily + Cynthia Mathis; BP= 150/80 & she is reminded to avoid sodium & take meds every day... On Simva20 for Chol w/ good control; FLP today shows TChol 169, TG 118, HDL 56, LDL 89 Hx Thyroid dis (see below) & clinically euthyroid off meds; TSH today = 0.58 Hx Divertics/ polyp & is sched for 59yr f/u colon 3/13 but her polyp was hyperplastic & Cynthia Mathis may change his mind... She has DJD & Osteoporosis; using OTC analgesics as needed & currently in the second year of a Bisphos drug holiday... She has signif macular degen followed by Cynthia Mathis & Cynthia Mathis, on eye vits... Due for 1st of the yr FASTING blood work today> all essent wnl...  ~  January 29, 2012:  50mo ROV & Cynthia Mathis reports a good interval> she had  a follow up colonoscopy from Cynthia Mathis 4/13- severe diverticulosis, but no inflamm change & no polyps seen;  She also had a basal cell skin cancer removed from the left side of her nose by Cynthia Mathis w/ subseq deeper resection & doing satis now;  She reports large lipoma on right flank/ RLQ & she would like to proceed w/ removal by Cynthia Mathis, Cynthia Mathis...    She notes some throat clearing & mild cough that might be related to her ACE?Cynthia Mathis Rx> we discussed this & decided to stop the ACE & change to Cynthia Mathis /d along w/ her Verapamil & low salt diet...    We reviewed prob list, meds, xrays and labs> see below for updates >>  03/08/2012 Acute OV  Patient presents for work in visit. Due to elevated blood pressure. Patient was seen one month ago at a routine followup and noted to have a  persistent cough and throat clearing. She was changed over from Cynthia Mathis to Cynthia Mathis. Patient complains that her blood pressure has been elevated since this medication change with blood pressures averaging anywhere between 150-180 systolic. We discussed several changes. Today with blood pressure medications, however, patient requests to be placed back on Cynthia Mathis, stating"  that she does not remember the cough,being that bad" As any headache, visual or speech changes, extremity weakness, or leg swelling  Has not taken any decongestants, and uses Aleve on rare occasion Has been under some stress with recent travel to see her children and her son-in-law died while they there from a massive heart attack         Problem List:  MACULAR DEGENERATION (ICD-362.50) - eval by Cynthia Mathis w/ foveal telangiectasia, macular drusen, macular degeneration, and cataracts- being followed regularly... vision is preserved so far... ~  11/11:  f/u Cynthia Mathis- stable age related mac degen, no retinopathy. ~  11/12: f/u Cynthia Mathis- has advanced bilat AMD, sent to Cynthia Mathis, we don't have his notes...  HYPERTENSION (ICD-401.9) - on VERAPAMIL 240mg /d,  Cynthia Mathis 20mg - taking 1.5tabs/d, and diet therapy... prev Rx'd for swelling w/ Dyazide but she got on a low sodium diet & was able to DC the diuretic. ~  8/10: borderline BP here and she will monitor at home carefully & call if not <150/90. ~  7/11: she reports that she incr the Cynthia Mathis to 1&1/2 tabs due to BP~150+, now better... ~  7/12:  BP stable on Verap240 & Cynthia Mathis 30mg /d> BP= 140/68 today> denies HA, fatigue, visual changes, CP, palipit, dizziness, syncope, dyspnea, edema, etc... ~  1/13:  BP= 160/90 (didn't take med today); states BP are ok at home; continue same meds + low sodium etc... ~  CXR 1/13 showed norm heart size, clear lungs, mild thor spondylosis, NAD.[name]. ~  7/13:  BP= 152/82 & she notes some troublesome throat clearing & mild cough; we decided to  STOP the ACE & switch to Cynthia Mathis /d, monitor her symptoms and her blood presure...  DYSLIPIDEMIA (ICD-272.4) - on ZOCOR 20mg /d... plus doing well on diet... ~  FLP 6/09 showed TChol 144, TG 140, HDL 39, LDL 77... continue same. ~  FLP 2/10 showed TChol 164, TG 77, HDL 54, LDL 95 ~  FLP 8/10 showed TChol 138, TG 51, HDL 48, LDL 80 ~  FLP 1/11 showed TChol 155, TG 100, HDL 48, LDL 87 ~  FLP 7/11 showed TChol 158, TG 74, HDL 55, LDL 89 ~  FLP 1/12 showed TChol 155, TG 101, HDL 50, LDL 85 ~  FLP 1/13 on Simva20 showed TChol 169,  TG 118, HDL 56, LDL 89   DIABETES MELLITUS, BORDERLINE (ICD-790.29) - on diet alone... FBS @ home betw 120-130 recently... she is on a good diet and weight is down at 139# today (63"  BMI= 24)... ~  labs 3/09 showed FBS= 119... subseq HgA1c 5/09 was 6.4... diet Rx. ~  labs 8/09 showed BS= 106, HgA1c= 5.9.Marland KitchenMarland Kitchen rec- same. ~  labs 2/10 showed BS= 102, A1c= 5.9 ~  labs 1/11 showed BS= 98 ~  labs 1/12 showed BS= 95 ~  Labs 1/13 showed BS= 101  HYPOTHYROIDISM (ICD-244.9) - off Synthroid now...  clinically & biochemically euthyroid... remote hx of overactive thyroid many yrs ago in [place] (we don't have records)... she had an eval by DrKohut in 1987 w/ familial multinodular goiter w/ neg FNA... started on Rx but goiter enlarged- had thyroidectomy 1990 by DrWeatherly... ~  labs 3/09 showed TSH = 0.35 (0.35-5.5)... we have slowly decr her dose to 55mcg/d... ~  labs 8/09 showed TSH= 0.33... rec- decr the Synth50 to 1/2 tab daily. ~  labs 2/10 showed =TSH= 0.54 and Synthroid stopped... ~  labs 4/10 off med showed TSH= 0.59 ~  labs 8/10 showed TSH= 0.60 ~  labs 1/11 showed TSH= 0.60 ~  labs 7/11 showed TSH= 0.56 ~  labs 1/12 showed TSH= 0.60 ~  Labs 1/13 showed TSH= 0.58  DIVERTICULOSIS OF COLON (ICD-562.10) COLONIC POLYPS (ICD-211.3) - followed by Cynthia Mathis... bout of diverticulitis in Mar08... ~  last colonoscopy 3/08 showed divertics, 2mm polyp, hems... path=hyperplastic,  f/u planned 32yrs. ~  F/u colonoscopy 4/13 by Cynthia Mathis showed severe diverticulosis w/o inflamm and w/o polyps etc...  HEMATURIA, HX OF (ICD-V13.09) - eval 4/08 by DrGrapey w/ neg Sonar, Cysto, cytology...  DEGENERATIVE JOINT DISEASE (ICD-715.90) - c/o discomfort in her shoulders and knees... uses OTC meds as needed.  Hx of OSTEOPOROSIS (ICD-733.00) - on Fosamax 70/wk thru 7/11, Caltrate 1/d, MVI, Vit D 1000/d... ~  initial BMD 10/01 showed TScores -0.2  to -3.2 in the spine...  ~  f/u studies w/ steady improvement on Fosamax therapy... ~  BMD 3/09 w/ TScores -0.1 to -1.7 in the spine... continue Rx. ~  BMD 7/11 showed TScores -2.0 Spine, and -0.3 in right FemNeck... we opted for Bisphos drug holiday to start now. ~  Labs 1/13 showed Vit D level = 48, continue same...  ? of MEMORY LOSS (ICD-780.93) - MMSE 5/09 by Shanon Brow, NP was 29/30... reassured.  ANXIETY (ICD-300.00) - prev on Alpraz Prn but hasn't used in yrs...  DERMATOLOGY >> LIPOMA (ICD-214.9) - notes large lipoma in RLQ/ rt flank area... some discomfort noted... seen by Cynthia Mathis in the past w/o surg... she saw Cynthia Mathis, Cynthia Mathis, 1/11 for second opinion & she will decide re: excision... ~  5/13:  She had Basal cell ca removed from the left side of her nose, w/ subseq deeper margins & reports everything is OK... ~  7/13:  She is concerned that the lipoma may be growing & she requests f/u Cynthia Mathis for excision...  SHINGLES - remote hx of right T8-9 shingles in 1986...   Past Surgical History  Procedure Date  . Thyroidectomy 1990    Cynthia. Zachery Dakins  . Appendectomy 1947     Outpatient Encounter Prescriptions as of 03/08/2012  Medication Sig Dispense Refill  . aspirin 81 MG tablet Take 81 mg by mouth daily.      . Calcium Carbonate-Vitamin D (CALCIUM 600+D) 600-400 MG-UNIT per tablet Take 1 tablet by mouth daily.      [name]  Cynthia Mathis (COZAAR) 100 MG tablet Take 1 tablet (100 mg total) by mouth daily.  90 tablet  3  . Multiple  Vitamins-Minerals (MULTIVITAMIN & MINERAL PO) Take 1 tablet by mouth daily.      . simvastatin (ZOCOR) 20 MG tablet Take 1 tablet (20 mg total) by mouth at bedtime.  90 tablet  3  . verapamil (CALAN-SR) 240 MG CR tablet Take 1 tablet (240 mg total) by mouth at bedtime.  90 tablet  3  . Cynthia Mathis (PRINIVIL,ZESTRIL) 40 MG tablet Take 1 tablet (40 mg total) by mouth daily.  30 tablet  5    Allergies  Allergen Reactions  . Epinephrine     REACTION: heart racing    Current Medications, Allergies, Past Medical History, Past Surgical History, Family History, and Social History were reviewed in [employer] record.    Review of Systems       Constitutional:   No  weight loss, night sweats,  Fevers, chills, fatigue, or  lassitude.  HEENT:   No headaches,  Difficulty swallowing,  Tooth/dental problems, or  Sore throat,                No sneezing, itching, ear ache, nasal congestion, post nasal drip,   CV:  No chest pain,  Orthopnea, PND, swelling in lower extremities, anasarca, dizziness, palpitations, syncope.   GI  No heartburn, indigestion, abdominal pain, nausea, vomiting, diarrhea, change in bowel habits, loss of appetite, bloody stools.   Resp: No shortness of breath with exertion or at rest.  No excess mucus, no productive cough,  No non-productive cough,  No coughing up of blood.  No change in color of mucus.  No wheezing.  No chest wall deformity  Skin: no rash or lesions.  GU: no dysuria, change in color of urine, no urgency or frequency.  No flank pain, no hematuria   MS:  No joint pain or swelling.  No decreased range of motion.     Psych:  No change in mood or affect. No depression or anxiety.  No memory loss.       Objective:   Physical Exam    WD, WN, 78 y/o WF in NAD... VITAL SIGNS:  Reviewed... GENERAL:  Alert & oriented; pleasant & cooperative... HEENT:  [place]/AT, EOM-wnl, PERRLA, EACs-clear, TMs-wnl, NOSE-clear, THROAT-clear & wnl. NECK:   Supple w/ fair ROM; no JVD; normal carotid impulses w/o bruits; scar of prev thyroid surg- no nodules or goiter; no lymphadenopathy. CHEST:  Clear to P & A; without wheezes/ rales/ or rhonchi. HEART:  Regular Rhythm; without murmurs/ rubs/ or gallops. ABDOMEN:  Soft & nontender; normal bowel sounds; no organomegaly or masses detected. EXT: without deformities, mild arthritic changes; no varicose veins/ venous insuffic/ or edema.   NEURO:  CN's intact;  no focal neuro deficits...      Assessment & Plan:

## 2012-03-12 ENCOUNTER — Other Ambulatory Visit (INDEPENDENT_AMBULATORY_CARE_PROVIDER_SITE_OTHER): Payer: Medicare Other

## 2012-03-12 ENCOUNTER — Other Ambulatory Visit: Payer: Self-pay | Admitting: Adult Health

## 2012-03-12 ENCOUNTER — Encounter: Payer: Self-pay | Admitting: Adult Health

## 2012-03-12 DIAGNOSIS — I10 Essential (primary) hypertension: Secondary | ICD-10-CM

## 2012-03-12 LAB — BASIC METABOLIC PANEL
BUN: 26 mg/dL — ABNORMAL HIGH (ref 6–23)
Chloride: 99 mEq/L (ref 96–112)
Glucose, Bld: 149 mg/dL — ABNORMAL HIGH (ref 70–99)
Potassium: 5.1 mEq/L (ref 3.5–5.1)
Sodium: 139 mEq/L (ref 135–145)

## 2012-03-12 MED ORDER — METOPROLOL SUCCINATE ER 25 MG PO TB24: 25.0000 mg | ORAL_TABLET | Freq: Every day | ORAL | Status: DC

## 2012-03-18 ENCOUNTER — Telehealth (INDEPENDENT_AMBULATORY_CARE_PROVIDER_SITE_OTHER): Payer: Self-pay

## 2012-03-18 NOTE — Telephone Encounter (Signed)
I called patient to reschedule her appointment she has scheduled for tomorrow since Cornett has cancelled his office. I rescheduled her to 9/26 @ 1:50.

## 2012-03-19 ENCOUNTER — Encounter (INDEPENDENT_AMBULATORY_CARE_PROVIDER_SITE_OTHER): Payer: Medicare Other | Admitting: Surgery

## 2012-04-04 ENCOUNTER — Ambulatory Visit (INDEPENDENT_AMBULATORY_CARE_PROVIDER_SITE_OTHER): Payer: Medicare Other | Admitting: Surgery

## 2012-04-04 ENCOUNTER — Encounter (INDEPENDENT_AMBULATORY_CARE_PROVIDER_SITE_OTHER): Payer: Self-pay | Admitting: Surgery

## 2012-04-04 VITALS — BP 148/72 | HR 60 | Temp 99.2°F | Resp 16 | Ht 63.0 in | Wt 136.6 lb

## 2012-04-04 DIAGNOSIS — R229 Localized swelling, mass and lump, unspecified: Secondary | ICD-10-CM

## 2012-04-04 DIAGNOSIS — IMO0002 Reserved for concepts with insufficient information to code with codable children: Secondary | ICD-10-CM

## 2012-04-04 NOTE — Progress Notes (Signed)
Patient ID: Cynthia Mathis, female   DOB: 12/21/1932, 79 y.o.   MRN: [id number]  Chief Complaint  Patient presents with  . Pre-op Exam    eval lipoma on RT flank    HPI Cynthia Mathis is a 79 y.o. female.  Patient sent at the request of Dr. Nadal. She has had a right lower quadrant abdominal wall mass for 7 years. He is growing. It causes mild to moderate discomfort especially when she lays on it. It is no drainage. No change in bowel or bladder function. HPI  Past Medical History  Diagnosis Date  . Macular degeneration   . Hypertension   . Dyslipidemia   . Borderline diabetes mellitus   . Hypothyroid   . Diverticulosis of colon   . Hx of colonic polyps   . History of hematuria   . DJD (degenerative joint disease)   . History of osteoporosis   . Memory loss   . Anxiety   . Lipoma   . Hyperlipidemia     Past Surgical History  Procedure Date  . Thyroidectomy 1990    Dr. Weatherly  . Appendectomy 1947    Family History  Problem Relation Age of Onset  . Colon cancer Neg Hx   . Stomach cancer Neg Hx     Social History History  Substance Use Topics  . Smoking status: Never Smoker   . Smokeless tobacco: Never Used  . Alcohol Use: No    Allergies  Allergen Reactions  . Epinephrine     REACTION: heart racing    Current Outpatient Prescriptions  Medication Sig Dispense Refill  . aspirin 81 MG tablet Take 81 mg by mouth daily.      . Calcium Carbonate-Vitamin D (CALCIUM 600+D) 600-400 MG-UNIT per tablet Take 1 tablet by mouth daily.      . hydrochlorothiazide (HYDRODIURIL) 25 MG tablet Take 1 tablet (25 mg total) by mouth daily.  30 tablet  5  . metoprolol succinate (TOPROL XL) 25 MG 24 hr tablet Take 1 tablet (25 mg total) by mouth daily.  30 tablet  PRN  . Multiple Vitamins-Minerals (MULTIVITAMIN & MINERAL PO) Take 1 tablet by mouth daily.      . simvastatin (ZOCOR) 20 MG tablet Take 1 tablet (20 mg total) by mouth at bedtime.  90 tablet  3  . verapamil  (CALAN-SR) 240 MG CR tablet Take 1 tablet (240 mg total) by mouth at bedtime.  90 tablet  3    Review of Systems Review of Systems  Constitutional: Negative for fever, chills and unexpected weight change.  HENT: Negative for hearing loss, congestion, sore throat, trouble swallowing and voice change.   Eyes: Negative for visual disturbance.  Respiratory: Negative for cough and wheezing.   Cardiovascular: Negative for chest pain, palpitations and leg swelling.  Gastrointestinal: Negative for nausea, vomiting, abdominal pain, diarrhea, constipation, blood in stool, abdominal distention and anal bleeding.  Genitourinary: Negative for hematuria, vaginal bleeding and difficulty urinating.  Musculoskeletal: Negative for arthralgias.  Skin: Negative for rash and wound.  Neurological: Negative for seizures, syncope and headaches.  Hematological: Negative for adenopathy. Does not bruise/bleed easily.  Psychiatric/Behavioral: Negative for confusion.    Blood pressure 148/72, pulse 60, temperature 99.2 F (37.3 C), temperature source Temporal, resp. rate 16, height 5' 3" (1.6 m), weight 136 lb 9.6 oz (61.961 kg).  Physical Exam Physical Exam  Constitutional: She is oriented to person, place, and time. She appears well-developed and well-nourished.  HENT:  Head: Normocephalic and   atraumatic.  Eyes: EOM are normal. Pupils are equal, round, and reactive to light.  Neck: Normal range of motion. Neck supple.  Cardiovascular: Normal rate and regular rhythm.   Pulmonary/Chest: Effort normal and breath sounds normal.  Abdominal: Soft. Bowel sounds are normal.    Musculoskeletal: Normal range of motion.  Neurological: She is alert and oriented to person, place, and time.  Skin: Skin is warm and dry.  Psychiatric: She has a normal mood and affect. Her behavior is normal. Judgment and thought content normal.      Assessment    Mass abdominal wall 8 cm right lower quadrant probable lipoma      Plan    Patient wishes to have it excised.The procedure has been discussed with the patient.  Alternative therapies have been discussed with the patient.  Operative risks include bleeding,  Infection,  Organ injury,  Nerve injury,  Blood vessel injury,  DVT,  Pulmonary embolism,  Death,  And possible reoperation.  Medical management risks include worsening of present situation.  The success of the procedure is 50 -90 % at treating patients symptoms.  The patient understands and agrees to proceed.       Freddi Forster A. 04/04/2012, 2:46 PM    

## 2012-04-04 NOTE — Patient Instructions (Signed)
Lipoma A lipoma is a noncancerous (benign) tumor composed of fat cells. They are usually found under the skin (subcutaneous). A lipoma may occur in any tissue of the body that contains fat. Common areas for lipomas to appear include the back, shoulders, buttocks, and thighs. Lipomas are a very common soft tissue growth. They are soft and grow slowly. Most problems caused by a lipoma depend on where it is growing. DIAGNOSIS  A lipoma can be diagnosed with a physical exam. These tumors rarely become cancerous, but radiographic studies can help determine this for certain. Studies used may include:  Computerized X-ray scans (CT or CAT scan).   Computerized magnetic scans (MRI).  TREATMENT  Small lipomas that are not causing problems may be watched. If a lipoma continues to enlarge or causes problems, removal is often the best treatment. Lipomas can also be removed to improve appearance. Surgery is done to remove the fatty cells and the surrounding capsule. Most often, this is done with medicine that numbs the area (local anesthetic). The removed tissue is examined under a microscope to make sure it is not cancerous. Keep all follow-up appointments with your caregiver. SEEK MEDICAL CARE IF:   The lipoma becomes larger or hard.   The lipoma becomes painful, red, or increasingly swollen. These could be signs of infection or a more serious condition.  Document Released: 06/16/2002 Document Revised: 06/15/2011 Document Reviewed: 11/26/2009 [employer] Patient Information 2012 [employer]. 

## 2012-04-15 ENCOUNTER — Other Ambulatory Visit: Payer: Self-pay

## 2012-04-15 ENCOUNTER — Encounter (HOSPITAL_BASED_OUTPATIENT_CLINIC_OR_DEPARTMENT_OTHER): Payer: Self-pay | Admitting: *Deleted

## 2012-04-15 ENCOUNTER — Ambulatory Visit
Admission: RE | Admit: 2012-04-15 | Discharge: 2012-04-15 | Disposition: A | Discharge status: Home / Self Care | Payer: Medicare Other | Source: Ambulatory Visit | Attending: Surgery | Admitting: Surgery

## 2012-04-15 ENCOUNTER — Encounter (HOSPITAL_BASED_OUTPATIENT_CLINIC_OR_DEPARTMENT_OTHER)
Admission: RE | Admit: 2012-04-15 | Discharge: 2012-04-15 | Disposition: A | Discharge status: Home / Self Care | Payer: Medicare Other | Source: Ambulatory Visit | Attending: Surgery | Admitting: Surgery

## 2012-04-15 LAB — COMPREHENSIVE METABOLIC PANEL
ALT: 14 U/L (ref 0–35)
BUN: 19 mg/dL (ref 6–23)
CO2: 31 mEq/L (ref 19–32)
Calcium: 9.7 mg/dL (ref 8.4–10.5)
Creatinine, Ser: 0.82 mg/dL (ref 0.50–1.10)
GFR calc Af Amer: 78 mL/min — ABNORMAL LOW (ref >90)
GFR calc non Af Amer: 67 mL/min — ABNORMAL LOW (ref >90)
Glucose, Bld: 168 mg/dL — ABNORMAL HIGH (ref 70–99)
Sodium: 145 mEq/L (ref 135–145)

## 2012-04-15 NOTE — Progress Notes (Signed)
To come in for ccs-labs,cxr,ekg 

## 2012-04-17 ENCOUNTER — Ambulatory Visit (HOSPITAL_BASED_OUTPATIENT_CLINIC_OR_DEPARTMENT_OTHER)
Admission: RE | Admit: 2012-04-17 | Discharge: 2012-04-17 | Disposition: A | Discharge status: Home / Self Care | Payer: Medicare Other | Source: Ambulatory Visit | Attending: Surgery | Admitting: Surgery

## 2012-04-17 ENCOUNTER — Ambulatory Visit (HOSPITAL_BASED_OUTPATIENT_CLINIC_OR_DEPARTMENT_OTHER): Payer: Medicare Other | Admitting: Anesthesiology

## 2012-04-17 ENCOUNTER — Encounter (HOSPITAL_BASED_OUTPATIENT_CLINIC_OR_DEPARTMENT_OTHER): Payer: Self-pay | Admitting: Anesthesiology

## 2012-04-17 ENCOUNTER — Encounter (HOSPITAL_BASED_OUTPATIENT_CLINIC_OR_DEPARTMENT_OTHER): Payer: Self-pay | Admitting: Surgery

## 2012-04-17 ENCOUNTER — Encounter (HOSPITAL_BASED_OUTPATIENT_CLINIC_OR_DEPARTMENT_OTHER): Payer: Self-pay | Admitting: *Deleted

## 2012-04-17 ENCOUNTER — Encounter (HOSPITAL_BASED_OUTPATIENT_CLINIC_OR_DEPARTMENT_OTHER)
Admission: RE | Disposition: A | Discharge status: Home / Self Care | Payer: Self-pay | Source: Ambulatory Visit | Attending: Surgery

## 2012-04-17 DIAGNOSIS — M810 Age-related osteoporosis without current pathological fracture: Secondary | ICD-10-CM

## 2012-04-17 DIAGNOSIS — D1739 Benign lipomatous neoplasm of skin and subcutaneous tissue of other sites: Secondary | ICD-10-CM | POA: Insufficient documentation

## 2012-04-17 DIAGNOSIS — R7309 Other abnormal glucose: Secondary | ICD-10-CM

## 2012-04-17 DIAGNOSIS — Z01810 Encounter for preprocedural cardiovascular examination: Secondary | ICD-10-CM | POA: Insufficient documentation

## 2012-04-17 DIAGNOSIS — E039 Hypothyroidism, unspecified: Secondary | ICD-10-CM | POA: Insufficient documentation

## 2012-04-17 DIAGNOSIS — I10 Essential (primary) hypertension: Secondary | ICD-10-CM | POA: Insufficient documentation

## 2012-04-17 DIAGNOSIS — F411 Generalized anxiety disorder: Secondary | ICD-10-CM

## 2012-04-17 DIAGNOSIS — Z01812 Encounter for preprocedural laboratory examination: Secondary | ICD-10-CM | POA: Insufficient documentation

## 2012-04-17 DIAGNOSIS — H3530 Unspecified macular degeneration: Secondary | ICD-10-CM

## 2012-04-17 DIAGNOSIS — Z87448 Personal history of other diseases of urinary system: Secondary | ICD-10-CM

## 2012-04-17 DIAGNOSIS — D126 Benign neoplasm of colon, unspecified: Secondary | ICD-10-CM

## 2012-04-17 DIAGNOSIS — E785 Hyperlipidemia, unspecified: Secondary | ICD-10-CM

## 2012-04-17 DIAGNOSIS — IMO0002 Reserved for concepts with insufficient information to code with codable children: Secondary | ICD-10-CM

## 2012-04-17 DIAGNOSIS — M1990 Unspecified osteoarthritis, unspecified site: Secondary | ICD-10-CM

## 2012-04-17 DIAGNOSIS — D179 Benign lipomatous neoplasm, unspecified: Secondary | ICD-10-CM

## 2012-04-17 DIAGNOSIS — K5730 Diverticulosis of large intestine without perforation or abscess without bleeding: Secondary | ICD-10-CM

## 2012-04-17 HISTORY — PX: MASS EXCISION: SHX2000

## 2012-04-17 LAB — POCT HEMOGLOBIN-HEMACUE: Hemoglobin: 13.8 g/dL (ref 12.0–15.0)

## 2012-04-17 SURGERY — EXCISION MASS
Anesthesia: General | Site: Abdomen | Wound class: Clean

## 2012-04-17 MED ORDER — FENTANYL CITRATE 0.05 MG/ML IJ SOLN
INTRAMUSCULAR | Status: DC | PRN
  Administered 2012-04-17: 100 ug via INTRAVENOUS

## 2012-04-17 MED ORDER — LACTATED RINGERS IV SOLN
INTRAVENOUS | Status: DC
  Administered 2012-04-17: 14:00:00 via INTRAVENOUS

## 2012-04-17 MED ORDER — OXYCODONE HCL 5 MG PO TABS: 5.0000 mg | ORAL_TABLET | Freq: Once | ORAL | Status: DC | PRN

## 2012-04-17 MED ORDER — BUPIVACAINE HCL 0.25 % IJ SOLN
INTRAMUSCULAR | Status: DC | PRN
  Administered 2012-04-17: 20 mL

## 2012-04-17 MED ORDER — EPHEDRINE SULFATE 50 MG/ML IJ SOLN
INTRAMUSCULAR | Status: DC | PRN
  Administered 2012-04-17: 10 mg via INTRAVENOUS

## 2012-04-17 MED ORDER — OXYCODONE HCL 5 MG/5ML PO SOLN: 5.0000 mg | Freq: Once | ORAL | Status: DC | PRN

## 2012-04-17 MED ORDER — DEXTROSE 5 % IV SOLN
3.0000 g | INTRAVENOUS | Status: AC
  Administered 2012-04-17: 3 g via INTRAVENOUS

## 2012-04-17 MED ORDER — METOPROLOL TARTRATE 12.5 MG HALF TABLET
12.5000 mg | ORAL_TABLET | Freq: Once | ORAL | Status: AC
  Administered 2012-04-17: 12.5 mg via ORAL

## 2012-04-17 MED ORDER — LIDOCAINE HCL (CARDIAC) 20 MG/ML IV SOLN
INTRAVENOUS | Status: DC | PRN
  Administered 2012-04-17: 50 mg via INTRAVENOUS

## 2012-04-17 MED ORDER — PROPOFOL 10 MG/ML IV BOLUS
INTRAVENOUS | Status: DC | PRN
  Administered 2012-04-17: 150 mg via INTRAVENOUS

## 2012-04-17 MED ORDER — CHLORHEXIDINE GLUCONATE 4 % EX LIQD: 1.0000 "application " | Freq: Once | CUTANEOUS | Status: DC

## 2012-04-17 MED ORDER — HYDROMORPHONE HCL PF 1 MG/ML IJ SOLN: 0.2500 mg | INTRAMUSCULAR | Status: DC | PRN

## 2012-04-17 MED ORDER — HYDROCODONE-ACETAMINOPHEN 5-325 MG PO TABS: 1.0000 | ORAL_TABLET | Freq: Four times a day (QID) | ORAL | Status: DC | PRN

## 2012-04-17 SURGICAL SUPPLY — 45 items
ADH SKN CLS APL DERMABOND .7 (GAUZE/BANDAGES/DRESSINGS) ×1
APL SKNCLS STERI-STRIP NONHPOA (GAUZE/BANDAGES/DRESSINGS)
BENZOIN TINCTURE PRP APPL 2/3 (GAUZE/BANDAGES/DRESSINGS) IMPLANT
BLADE SURG 10 STRL SS (BLADE) IMPLANT
BLADE SURG 15 STRL LF DISP TIS (BLADE) ×1 IMPLANT
BLADE SURG 15 STRL SS (BLADE) ×2
CANISTER SUCTION 1200CC (MISCELLANEOUS) IMPLANT
CHLORAPREP W/TINT 26ML (MISCELLANEOUS) ×2 IMPLANT
COVER MAYO STAND STRL (DRAPES) ×2 IMPLANT
COVER TABLE BACK 60X90 (DRAPES) ×2 IMPLANT
DECANTER SPIKE VIAL GLASS SM (MISCELLANEOUS) ×1 IMPLANT
DERMABOND ADVANCED (GAUZE/BANDAGES/DRESSINGS) ×1
DERMABOND ADVANCED .7 DNX12 (GAUZE/BANDAGES/DRESSINGS) IMPLANT
DRAPE PED LAPAROTOMY (DRAPES) ×2 IMPLANT
DRAPE UTILITY XL STRL (DRAPES) ×2 IMPLANT
ELECT COATED BLADE 2.86 ST (ELECTRODE) ×2 IMPLANT
ELECT REM PT RETURN 9FT ADLT (ELECTROSURGICAL) ×2
ELECTRODE REM PT RTRN 9FT ADLT (ELECTROSURGICAL) ×1 IMPLANT
GLOVE BIOGEL PI IND STRL 8 (GLOVE) ×1 IMPLANT
GLOVE BIOGEL PI INDICATOR 8 (GLOVE) ×1
GLOVE ECLIPSE 6.5 STRL STRAW (GLOVE) ×3 IMPLANT
GLOVE ECLIPSE 8.0 STRL XLNG CF (GLOVE) ×2 IMPLANT
GOWN PREVENTION PLUS XLARGE (GOWN DISPOSABLE) ×3 IMPLANT
NDL HYPO 25X1 1.5 SAFETY (NEEDLE) ×1 IMPLANT
NEEDLE HYPO 25X1 1.5 SAFETY (NEEDLE) ×2 IMPLANT
NS IRRIG 1000ML POUR BTL (IV SOLUTION) ×1 IMPLANT
PACK BASIN DAY SURGERY FS (CUSTOM PROCEDURE TRAY) ×2 IMPLANT
PENCIL BUTTON HOLSTER BLD 10FT (ELECTRODE) ×2 IMPLANT
SLEEVE SCD COMPRESS KNEE MED (MISCELLANEOUS) ×2 IMPLANT
SPONGE LAP 4X18 X RAY DECT (DISPOSABLE) IMPLANT
STAPLER VISISTAT 35W (STAPLE) IMPLANT
STRIP CLOSURE SKIN 1/2X4 (GAUZE/BANDAGES/DRESSINGS) IMPLANT
SUT MNCRL AB 3-0 PS2 18 (SUTURE) ×1 IMPLANT
SUT MON AB 4-0 PC3 18 (SUTURE) ×2 IMPLANT
SUT VIC AB 2-0 SH 27 (SUTURE) ×2
SUT VIC AB 2-0 SH 27XBRD (SUTURE) IMPLANT
SUT VIC AB 3-0 SH 27 (SUTURE) ×2
SUT VIC AB 3-0 SH 27X BRD (SUTURE) ×1 IMPLANT
SUT VICRYL AB 3 0 TIES (SUTURE) IMPLANT
SYR CONTROL 10ML LL (SYRINGE) ×2 IMPLANT
TOWEL OR 17X24 6PK STRL BLUE (TOWEL DISPOSABLE) ×4 IMPLANT
TOWEL OR NON WOVEN STRL DISP B (DISPOSABLE) ×2 IMPLANT
TUBE CONNECTING 20X1/4 (TUBING) IMPLANT
WATER STERILE IRR 1000ML POUR (IV SOLUTION) ×1 IMPLANT
YANKAUER SUCT BULB TIP NO VENT (SUCTIONS) IMPLANT

## 2012-04-17 NOTE — H&P (View-Only) (Signed)
Patient ID: Cynthia Mathis, female   DOB: 03-21-1933, 78 y.o.   MRN: [id number]  Chief Complaint  Patient presents with  . Pre-op Exam    eval lipoma on RT flank    HPI Cynthia Mathis is a 78 y.o. female.  Patient sent at the request of Dr. Lennon Alstrom. She has had a right lower quadrant abdominal wall mass for 7 years. He is growing. It causes mild to moderate discomfort especially when she lays on it. It is no drainage. No change in bowel or bladder function. HPI  Past Medical History  Diagnosis Date  . Macular degeneration   . Hypertension   . Dyslipidemia   . Borderline diabetes mellitus   . Hypothyroid   . Diverticulosis of colon   . Hx of colonic polyps   . History of hematuria   . DJD (degenerative joint disease)   . History of osteoporosis   . Memory loss   . Anxiety   . Lipoma   . Hyperlipidemia     Past Surgical History  Procedure Date  . Thyroidectomy 1990    Dr. Zachery Dakins  . Appendectomy 1947    Family History  Problem Relation Age of Onset  . Colon cancer Neg Hx   . Stomach cancer Neg Hx     Social History History  Substance Use Topics  . Smoking status: Never Smoker   . Smokeless tobacco: Never Used  . Alcohol Use: No    Allergies  Allergen Reactions  . Epinephrine     REACTION: heart racing    Current Outpatient Prescriptions  Medication Sig Dispense Refill  . aspirin 81 MG tablet Take 81 mg by mouth daily.      . Calcium Carbonate-Vitamin D (CALCIUM 600+D) 600-400 MG-UNIT per tablet Take 1 tablet by mouth daily.      . hydrochlorothiazide (HYDRODIURIL) 25 MG tablet Take 1 tablet (25 mg total) by mouth daily.  30 tablet  5  . metoprolol succinate (TOPROL XL) 25 MG 24 hr tablet Take 1 tablet (25 mg total) by mouth daily.  30 tablet  PRN  . Multiple Vitamins-Minerals (MULTIVITAMIN & MINERAL PO) Take 1 tablet by mouth daily.      . simvastatin (ZOCOR) 20 MG tablet Take 1 tablet (20 mg total) by mouth at bedtime.  90 tablet  3  . verapamil  (CALAN-SR) 240 MG CR tablet Take 1 tablet (240 mg total) by mouth at bedtime.  90 tablet  3    Review of Systems Review of Systems  Constitutional: Negative for fever, chills and unexpected weight change.  HENT: Negative for hearing loss, congestion, sore throat, trouble swallowing and voice change.   Eyes: Negative for visual disturbance.  Respiratory: Negative for cough and wheezing.   Cardiovascular: Negative for chest pain, palpitations and leg swelling.  Gastrointestinal: Negative for nausea, vomiting, abdominal pain, diarrhea, constipation, blood in stool, abdominal distention and anal bleeding.  Genitourinary: Negative for hematuria, vaginal bleeding and difficulty urinating.  Musculoskeletal: Negative for arthralgias.  Skin: Negative for rash and wound.  Neurological: Negative for seizures, syncope and headaches.  Hematological: Negative for adenopathy. Does not bruise/bleed easily.  Psychiatric/Behavioral: Negative for confusion.    Blood pressure 148/72, pulse 60, temperature 99.2 F (37.3 C), temperature source Temporal, resp. rate 16, height 5\' 3"  (1.6 m), weight 136 lb 9.6 oz (61.961 kg).  Physical Exam Physical Exam  Constitutional: She is oriented to person, place, and time. She appears well-developed and well-nourished.  HENT:  Head: Normocephalic and  atraumatic.  Eyes: EOM are normal. Pupils are equal, round, and reactive to light.  Neck: Normal range of motion. Neck supple.  Cardiovascular: Normal rate and regular rhythm.   Pulmonary/Chest: Effort normal and breath sounds normal.  Abdominal: Soft. Bowel sounds are normal.    Musculoskeletal: Normal range of motion.  Neurological: She is alert and oriented to person, place, and time.  Skin: Skin is warm and dry.  Psychiatric: She has a normal mood and affect. Her behavior is normal. Judgment and thought content normal.      Assessment    Mass abdominal wall 8 cm right lower quadrant probable lipoma      Plan    Patient wishes to have it excised.The procedure has been discussed with the patient.  Alternative therapies have been discussed with the patient.  Operative risks include bleeding,  Infection,  Organ injury,  Nerve injury,  Blood vessel injury,  DVT,  Pulmonary embolism,  Death,  And possible reoperation.  Medical management risks include worsening of present situation.  The success of the procedure is 50 -90 % at treating patients symptoms.  The patient understands and agrees to proceed.       Cynthia Mathis A. 04/04/2012, 2:46 PM

## 2012-04-17 NOTE — Anesthesia Preprocedure Evaluation (Addendum)
Anesthesia Evaluation  Patient identified by MRN, date of birth, ID band Patient awake    Reviewed: Allergy & Precautions, H&P , NPO status , Patient's Chart, lab work & pertinent test results, reviewed documented beta blocker date and time   Airway Mallampati: I TM Distance: >3 FB Neck ROM: Full    Dental No notable dental hx. (+) Teeth Intact and Dental Advisory Given   Pulmonary neg pulmonary ROS,  breath sounds clear to auscultation  Pulmonary exam normal       Cardiovascular hypertension, On Home Beta Blockers Rhythm:Regular Rate:Normal     Neuro/Psych negative neurological ROS  negative psych ROS   GI/Hepatic negative GI ROS, Neg liver ROS,   Endo/Other  negative endocrine ROSHypothyroidism   Renal/GU negative Renal ROS  negative genitourinary   Musculoskeletal   Abdominal   Peds  Hematology negative hematology ROS (+)   Anesthesia Other Findings   Reproductive/Obstetrics negative OB ROS                           Anesthesia Physical Anesthesia Plan  ASA: II  Anesthesia Plan: General   Post-op Pain Management:    Induction: Intravenous  Airway Management Planned: LMA  Additional Equipment:   Intra-op Plan:   Post-operative Plan: Extubation in OR  Informed Consent: I have reviewed the patients History and Physical, chart, labs and discussed the procedure including the risks, benefits and alternatives for the proposed anesthesia with the patient or authorized representative who has indicated his/her understanding and acceptance.   Dental advisory given  Plan Discussed with: CRNA  Anesthesia Plan Comments:         Anesthesia Quick Evaluation

## 2012-04-17 NOTE — Op Note (Signed)
Preoperative diagnosis: Mass abdominal wall measuring 9 cm subcutaneous  Postoperative diagnosis: Lipoma subcutaneous abdominal wall measuring 9.7 cm  Procedure: Excision lipoma  Surgeon: Harriette Bouillon M.D.  Anesthesia: LMA with 0.25% Sensorcaine local  EBL: Minimal  Drains none  Specimen: Mass 9.7 cm  Indications for procedure: Patient presents for excision of large mass in the subcutaneous fat right lower quadrant abdominal wall. It is causing pain she wished to have removed. The procedure has been discussed with the patient.  Alternative therapies have been discussed with the patient.  Operative risks include bleeding,  Infection,  Organ injury,  Nerve injury,  Blood vessel injury,  DVT,  Pulmonary embolism,  Death,  And possible reoperation.  Medical management risks include worsening of present situation.  The success of the procedure is 50 -90 % at treating patients symptoms.  The patient understands and agrees to proceed.   Description of procedure: The patient met in holding area and questions answered. She's taken the operating room and placed supine. General anesthesia initiated smoothly. The abdomen was prepped and draped sterile fashion and timeout was done. Antibiotics were administered. Transverse right lower quadrant incision was made and a large lipoma excised from the subcutaneous fat. Measures 9.7 cm. Wound closed in layers using 3-0 Vicryl and 3-0 Monocryl. Dermabond applied. All final counts found to be correct. Patient awoke taken recovery in satisfactory condition.

## 2012-04-17 NOTE — Interval H&P Note (Signed)
History and Physical Interval Note:  04/17/2012 3:01 PM  Cynthia Mathis  has presented today for surgery, with the diagnosis of abdominal wall mass  The various methods of treatment have been discussed with the patient and family. After consideration of risks, benefits and other options for treatment, the patient has consented to  Procedure(s) (LRB) with comments: EXCISION MASS (N/A) as a surgical intervention .  The patient's history has been reviewed, patient examined, no change in status, stable for surgery.  I have reviewed the patient's chart and labs.  Questions were answered to the patient's satisfaction.     [name].

## 2012-04-17 NOTE — Anesthesia Postprocedure Evaluation (Signed)
  Anesthesia Post-op Note  Patient: Cynthia Mathis  Procedure(s) Performed: Procedure(s) (LRB) with comments: EXCISION MASS (N/A)  Patient Location: PACU  Anesthesia Type: General  Level of Consciousness: awake and alert   Airway and Oxygen Therapy: Patient Spontanous Breathing  Post-op Pain: mild  Post-op Assessment: Post-op Vital signs reviewed, Patient's Cardiovascular Status Stable, Respiratory Function Stable, Patent Airway, No signs of Nausea or vomiting and Pain level controlled  Post-op Vital Signs: stable  Complications: No apparent anesthesia complications

## 2012-04-17 NOTE — Anesthesia Procedure Notes (Signed)
Procedure Name: LMA Insertion Performed by: Lance Coon Pre-anesthesia Checklist: Patient identified, Timeout performed, Emergency Drugs available, Suction available and Patient being monitored Patient Re-evaluated:Patient Re-evaluated prior to inductionOxygen Delivery Method: Circle system utilized Preoxygenation: Pre-oxygenation with 100% oxygen Intubation Type: IV induction Ventilation: Mask ventilation without difficulty LMA: LMA inserted LMA Size: 4.0 Placement Confirmation: breath sounds checked- equal and bilateral and positive ETCO2 Dental Injury: Teeth and Oropharynx as per pre-operative assessment

## 2012-04-17 NOTE — Transfer of Care (Signed)
Immediate Anesthesia Transfer of Care Note  Patient: Cynthia Mathis  Procedure(s) Performed: Procedure(s) (LRB) with comments: EXCISION MASS (N/A)  Patient Location: PACU  Anesthesia Type: General  Level of Consciousness: awake and oriented  Airway & Oxygen Therapy: Patient Spontanous Breathing and Patient connected to face mask oxygen  Post-op Assessment: Report given to PACU RN and Post -op Vital signs reviewed and stable  Post vital signs: Reviewed and stable  Complications: No apparent anesthesia complications

## 2012-04-18 ENCOUNTER — Encounter (HOSPITAL_BASED_OUTPATIENT_CLINIC_OR_DEPARTMENT_OTHER): Payer: Self-pay | Admitting: Surgery

## 2012-04-19 ENCOUNTER — Ambulatory Visit: Payer: Medicare Other | Admitting: Pulmonary Disease

## 2012-04-22 ENCOUNTER — Encounter (HOSPITAL_BASED_OUTPATIENT_CLINIC_OR_DEPARTMENT_OTHER): Payer: Self-pay

## 2012-05-03 ENCOUNTER — Ambulatory Visit (INDEPENDENT_AMBULATORY_CARE_PROVIDER_SITE_OTHER): Payer: Medicare Other | Admitting: Surgery

## 2012-05-03 ENCOUNTER — Encounter (INDEPENDENT_AMBULATORY_CARE_PROVIDER_SITE_OTHER): Payer: Self-pay | Admitting: Surgery

## 2012-05-03 VITALS — BP 142/82 | HR 63 | Temp 97.0°F | Ht 62.0 in | Wt 137.4 lb

## 2012-05-03 DIAGNOSIS — Z9889 Other specified postprocedural states: Secondary | ICD-10-CM

## 2012-05-03 NOTE — Patient Instructions (Signed)
Return as needed

## 2012-05-03 NOTE — Progress Notes (Signed)
The patient returns after excision of lipoma from abdominal wall. She is doing well.  Exam: Incision clean dry and intact without signs of infection.  Impression: Status post excision lipoma right lower quadrant abdominal wall  Plan: She is doing well. Return as needed. Resume full activities

## 2012-05-14 ENCOUNTER — Encounter: Payer: Self-pay | Admitting: *Deleted

## 2012-05-15 ENCOUNTER — Encounter: Payer: Self-pay | Admitting: Pulmonary Disease

## 2012-05-15 ENCOUNTER — Other Ambulatory Visit (INDEPENDENT_AMBULATORY_CARE_PROVIDER_SITE_OTHER): Payer: Medicare Other

## 2012-05-15 ENCOUNTER — Ambulatory Visit (INDEPENDENT_AMBULATORY_CARE_PROVIDER_SITE_OTHER): Payer: Medicare Other | Admitting: Pulmonary Disease

## 2012-05-15 VITALS — BP 126/72 | HR 50 | Temp 98.4°F | Ht 62.0 in | Wt 137.2 lb

## 2012-05-15 DIAGNOSIS — I10 Essential (primary) hypertension: Secondary | ICD-10-CM

## 2012-05-15 DIAGNOSIS — R7309 Other abnormal glucose: Secondary | ICD-10-CM

## 2012-05-15 DIAGNOSIS — E785 Hyperlipidemia, unspecified: Secondary | ICD-10-CM

## 2012-05-15 DIAGNOSIS — K5730 Diverticulosis of large intestine without perforation or abscess without bleeding: Secondary | ICD-10-CM

## 2012-05-15 DIAGNOSIS — Z23 Encounter for immunization: Secondary | ICD-10-CM

## 2012-05-15 DIAGNOSIS — D649 Anemia, unspecified: Secondary | ICD-10-CM

## 2012-05-15 DIAGNOSIS — E039 Hypothyroidism, unspecified: Secondary | ICD-10-CM

## 2012-05-15 DIAGNOSIS — F411 Generalized anxiety disorder: Secondary | ICD-10-CM

## 2012-05-15 DIAGNOSIS — Z148 Genetic carrier of other disease: Secondary | ICD-10-CM

## 2012-05-15 DIAGNOSIS — M1990 Unspecified osteoarthritis, unspecified site: Secondary | ICD-10-CM

## 2012-05-15 DIAGNOSIS — M810 Age-related osteoporosis without current pathological fracture: Secondary | ICD-10-CM

## 2012-05-15 LAB — CBC WITH DIFFERENTIAL/PLATELET
Basophils Absolute: 0.1 10*3/uL (ref 0.0–0.1)
Eosinophils Absolute: 0.3 10*3/uL (ref 0.0–0.7)
HCT: 44.6 % (ref 36.0–46.0)
Hemoglobin: 14.5 g/dL (ref 12.0–15.0)
Lymphs Abs: 3.4 10*3/uL (ref 0.7–4.0)
MCHC: 32.5 g/dL (ref 30.0–36.0)
MCV: 91.8 fl (ref 78.0–100.0)
Neutro Abs: 6.1 10*3/uL (ref 1.4–7.7)
RDW: 12.8 % (ref 11.5–14.6)

## 2012-05-15 NOTE — Progress Notes (Addendum)
Subjective:    Patient ID: Cynthia Mathis, female    DOB: December 16, 1932, 78 y.o.   MRN: [id number]  HPI 78 y/o WF here for a follow up visit... she has mult med problems as noted below ...   ~  sees DrPerry for GI- divertics, polyps, hems & a bout of diverticulitis in Mar08...  ~  sees Dr [name] Caprice Urology w/ microscopic hematuria and a neg eval 5/08... 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 ~  sees DrLLomax for Derm w/ alopecia- offered Rogaine but decided against it...  ~  July 19, 2010:  she raised a question about her Simva20 & the data on DM> her BS's have been normal- no signs of DM & her A1c's all ~6, we will continue to monitor... notes some LBP after recent trip to [place]> offered XRay & meds but she'll do heat etc & let me know... BP controlled on meds;  Chol looks good on the Simva20;  Thyroid stable off med;  F/u BMD done 7/11 showed osteopenia in Spine on Calcium, MVI, Vit D & bisphos drug holiday since 7/11...  ~  January 23, 2011:  31mo ROV & she notes some mild right knee pain but declines Rx for anti-inflamm Rx & she will use OTC Aleve as needed;  She plays "[employer]" a game somewhere in [place] & [place] using a whiffle ball & small rackets (good exercise); BP stable on meds & she denies CP, palpit, SOB, edema, etc;  Lipids have been good on diet + Simva20;  BS doing very well on diet alone;  Ortho stable & she is 65yr into Bisphos drug holiday & reminded to continue the Calcium, MVI, Vit D supplements...  ~  July 31, 2011:  31mo ROV & she has had a recent URI w/ cough, yellow sputum, no hemoptysis, sore chest & abd from the cough, but no f/c/s;  We discussed further eval w/ CXR, labs, and treatment w/ ZPak, Mucinex, Fluids, Delsym, etc... On Lisinopril20- taking 1.5tabs daily + Verelan240; BP= 150/80 & she is reminded to avoid sodium & take meds every day... On Simva20 for Chol w/ good control; FLP today shows TChol 169, TG 118, HDL 56, LDL 89 Hx  Thyroid dis (see below) & clinically euthyroid off meds; TSH today = 0.58 Hx Divertics/ polyp & is sched for 33yr f/u colon 3/13 but her polyp was hyperplastic & DrPerry may change his mind... She has DJD & Osteoporosis; using OTC analgesics as needed & currently in the second year of a Bisphos drug holiday... She has signif macular degen followed by DrHecker & Rankin, on eye vits... Due for 1st of the yr FASTING blood work today> all essent wnl...  ~  January 29, 2012:  31mo ROV & Cynthia Mathis reports a good interval> she had a follow up colonoscopy from DrPerry 4/13- severe diverticulosis, but no inflamm change & no polyps seen;  She also had a basal cell skin cancer removed from the left side of her nose by DrLLomax w/ subseq deeper resection & doing satis now;  She reports large lipoma on right flank/ RLQ & she would like to proceed w/ removal by DrCornett, CCS...    She notes some throat clearing & mild cough that might be related to her ACE?Lisinopril Rx> we discussed this & decided to stop the ACE & change to LOSARTAN 100mg /d along w/ her Verapamil & low salt diet...    We reviewed prob list, meds, xrays and labs>  see below for updates >>  ~  May 15, 2012:  3-43mo ROV & [name] has had a prob w/ her BP> when I last saw her she was on Verap240 + Lisinopril30 & BP was ~150/80 but she had a throat clearing tic & mild cough so we switched the ACE to Losartan100; side effect improved somewhat but BP was higher on her home checks & she wanted to go back on the Lisin; she saw TP & her labs showed K=6.0 so the ACE/ ARB was stopped in favor of HCTZ25mg /d; f/u labs showed K back to 5 but home BP checks were up somewhat, therefore ToprolXL25 added;  BP generally better on the combo Verap240, HCT25, MetopER25 & measured 140/80 at day-surg & at DrCornett's office recently & measures 126/72 here today (she says home readings are higher & she will bring cuff to f/u OVs)... She does not HR is slower & measures 50 at  rest here today but she is asymptomatic- good energy, not dizzy, no CP/ palpit/ weakness/ SOB/ etc; she plays "pickle ball" regularly & some matches last 3H & she has no difficulty...  In the final analysis she decided to continue the same meds rather than change them further...    She had her RLQ Lipoma removed surgically as outpt by DrCornett 04/17/12, she is glad to be rid of it...    She also tells me that her daugh in [place] was Dx w/ Hemochromatosis; they didn't have any details but she apparently undergoing phlebotomy now; I explained that both she & her husb would have to be carriers of the gene & we will check her Fe level & Hemochromatosis DNA test...  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          Problem List:  MACULAR DEGENERATION (ICD-362.50) - eval by DrHecker w/ foveal telangiectasia, macular drusen, macular degeneration, and cataracts- being followed regularly... vision is preserved so far... ~  11/11:  f/u DrHecker- stable age related mac degen, no retinopathy. ~  11/12:  f/u DrHecker- has advanced bilat AMD, sent to DrRankin, we don't have his notes...  HYPERTENSION (ICD-401.9) - on VERAPAMIL 240mg /d,  TOPROL-XL 25mg /d,  HCTZ 25mg /d... Prev on Lisinopril but this was stopped due to throat clearing tic & mild cough, plus K=6.0 at the time (all improved off the ACE)... ~  8/10: borderline BP here and she will monitor at home carefully & call if not <150/90. ~  7/11: she reports that she incr the Lisinopril to 1&1/2 tabs due to BP~150+, now better... ~  7/12:  BP stable on Verap240 & Lisinopril 30mg /d> BP= 140/68 today> denies HA, fatigue, visual changes, CP, palipit, dizziness, syncope, dyspnea, edema, etc... ~  1/13:  BP= 160/90 (didn't take med today); states BP are ok at home; continue same meds + low sodium etc... ~  CXR 1/13 showed norm heart size, clear lungs, mild thor spondylosis, NAD.[name]. ~  7/13:  BP=  152/82 & she notes some troublesome throat clearing & mild cough; we decided to STOP the ACE & switch to LOSARTAN 100mg /d, monitor her symptoms and her blood presure... ~  11/13:  She had long convoluted hx of BP med changes- see above- now on Verap240, MetopER25, HCT25, & BP= 126/72; we discussed options but she is content to continue these same meds for now & watch BP...  DYSLIPIDEMIA (ICD-272.4) - on ZOCOR 20mg /d... plus doing well on diet... ~  FLP  6/09 showed TChol 144, TG 140, HDL 39, LDL 77... continue same. ~  FLP 2/10 showed TChol 164, TG 77, HDL 54, LDL 95 ~  FLP 8/10 showed TChol 138, TG 51, HDL 48, LDL 80 ~  FLP 1/11 showed TChol 155, TG 100, HDL 48, LDL 87 ~  FLP 7/11 showed TChol 158, TG 74, HDL 55, LDL 89 ~  FLP 1/12 showed TChol 155, TG 101, HDL 50, LDL 85 ~  FLP 1/13 on Simva20 showed TChol 169, TG 118, HDL 56, LDL 89   Hx Impaired Fasting Glucose >> on diet alone... FBS @ home betw 120-130 recently... she is on a good diet and weight is down at 139# today (63"  BMI= 24)... ~  labs 3/09 showed FBS= 119... subseq HgA1c 5/09 was 6.4... diet Rx. ~  labs 8/09 showed BS= 106, HgA1c= 5.9.Marland KitchenMarland Kitchen rec- same. ~  labs 2/10 showed BS= 102, A1c= 5.9 ~  labs 1/11 showed BS= 98 ~  labs 1/12 showed BS= 95 ~  Labs 1/13 showed BS= 101  HYPOTHYROIDISM (ICD-244.9) - off Synthroid now...  clinically & biochemically euthyroid... remote hx of overactive thyroid many yrs ago in [place] (we don't have records)... she had an eval by DrKohut in 1987 w/ familial multinodular goiter w/ neg FNA... started on Rx but goiter enlarged- had thyroidectomy 1990 by DrWeatherly... ~  labs 3/09 showed TSH = 0.35 (0.35-5.5)... we have slowly decr her dose to 26mcg/d... ~  labs 8/09 showed TSH= 0.33... rec- decr the Synth50 to 1/2 tab daily. ~  labs 2/10 showed =TSH= 0.54 and Synthroid stopped... ~  labs 4/10 off med showed TSH= 0.59 ~  labs 8/10 showed TSH= 0.60 ~  labs 1/11 showed TSH= 0.60 ~  labs 7/11 showed TSH=  0.56 ~  labs 1/12 showed TSH= 0.60 ~  Labs 1/13 showed TSH= 0.58  DIVERTICULOSIS OF COLON (ICD-562.10) COLONIC POLYPS (ICD-211.3) - followed by DrPerry... bout of diverticulitis in Mar08... ~  last colonoscopy 3/08 showed divertics, 2mm polyp, hems... path=hyperplastic, f/u planned 82yrs. ~  F/u colonoscopy 4/13 by DrPerry showed severe diverticulosis w/o inflamm and w/o polyps etc...  NOTE>> Daugh in [place] diagnosed w/ Hemochromatosis & we checked Sheily's Hemochromatosis DNA= heterozygous for the C282Y mutation... 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  HEMATURIA, HX OF (ICD-V13.09) - eval 4/08 by DrGrapey w/ neg Sonar, Cysto, cytology...  DEGENERATIVE JOINT DISEASE (ICD-715.90) - c/o discomfort in her shoulders and knees... uses OTC meds as needed.  Hx of OSTEOPOROSIS (ICD-733.00) - on Fosamax 70/wk thru 7/11, Caltrate 1/d, MVI, Vit D 1000/d... ~  initial BMD 10/01 showed TScores -0.2  to -3.2 in the spine...  ~  f/u studies w/ steady improvement on Fosamax therapy... ~  BMD 3/09 w/ TScores -0.1 to -1.7 in the spine... continue Rx. ~  BMD 7/11 showed TScores -2.0 Spine, and -0.3 in right FemNeck... we opted for Bisphos drug holiday to start now. ~  Labs 1/13 showed Vit D level = 48, continue same...  ? of MEMORY LOSS (ICD-780.93) - MMSE 5/09 by Shanon Brow, NP was 29/30... reassured.  ANXIETY (ICD-300.00) - prev on Alpraz Prn but hasn't used in yrs...  DERMATOLOGY >> LIPOMA (ICD-214.9) - notes large lipoma in RLQ/ rt flank area... some discomfort noted... seen by DrLeone in the past w/o surg... she saw DrCornett, CCS, 1/11 for second opinion & she will decide re: excision... ~  5/13:  She  had Basal cell ca removed from the left side of her nose, w/ subseq deeper margins & reports everything is OK... ~  7/13:  She is concerned that the lipoma may be growing & she requests f/u DrCornett for excision... ~   10/13:  Lipoma removed by DrCornett in outpt surg 04/17/12...  SHINGLES - remote hx of right T8-9 shingles in 1986...   Past Surgical History  Procedure Date  . Thyroidectomy 1990    Dr. Zachery Dakins  . Appendectomy 1947  . Colonoscopy   . Mass excision 04/17/2012    Procedure: EXCISION MASS;  Surgeon: Clovis Pu. Cornett, MD;  Location: [facility];  Service: General;  Laterality: N/A;     Outpatient Encounter Prescriptions as of 05/15/2012  Medication Sig Dispense Refill  . aspirin 81 MG tablet Take 81 mg by mouth daily.      . Calcium Carbonate-Vitamin D (CALCIUM 600+D) 600-400 MG-UNIT per tablet Take 1 tablet by mouth daily.      . hydrochlorothiazide (HYDRODIURIL) 25 MG tablet Take 1 tablet (25 mg total) by mouth daily.  30 tablet  5  . metoprolol succinate (TOPROL XL) 25 MG 24 hr tablet Take 1 tablet (25 mg total) by mouth daily.  30 tablet  PRN  . Multiple Vitamins-Minerals (MULTIVITAMIN & MINERAL PO) Take 1 tablet by mouth daily.      . simvastatin (ZOCOR) 20 MG tablet Take 1 tablet (20 mg total) by mouth at bedtime.  90 tablet  3  . verapamil (CALAN-SR) 240 MG CR tablet Take 1 tablet (240 mg total) by mouth at bedtime.  90 tablet  3  . [DISCONTINUED] HYDROcodone-acetaminophen (NORCO) 5-325 MG per tablet Take 1 tablet by mouth every 6 (six) hours as needed for pain.  30 tablet  0    Allergies  Allergen Reactions  . Epinephrine     REACTION: heart racing    Current Medications, Allergies, Past Medical History, Past Surgical History, Family History, and Social History were reviewed in [employer] record.    Review of Systems        See HPI - all other systems neg except as noted...  The patient complains of dyspnea on exertion.  The patient denies anorexia, fever, weight loss, weight gain, vision loss, decreased hearing, hoarseness, chest pain, syncope, peripheral edema, prolonged cough, headaches, hemoptysis, abdominal pain, melena,  hematochezia, severe indigestion/heartburn, hematuria, incontinence, muscle weakness, suspicious skin lesions, transient blindness, difficulty walking, depression, unusual weight change, abnormal bleeding, enlarged lymph nodes, and angioedema.   Objective:   Physical Exam    WD, WN, 78 y/o WF in NAD... VITAL SIGNS:  Reviewed... GENERAL:  Alert & oriented; pleasant & cooperative... HEENT:  [place]/AT, EOM-wnl, PERRLA, EACs-clear, TMs-wnl, NOSE-clear, THROAT-clear & wnl. NECK:  Supple w/ fair ROM; no JVD; normal carotid impulses w/o bruits; scar of prev thyroid surg- no nodules or goiter; no lymphadenopathy. CHEST:  Clear to P & A; without wheezes/ rales/ or rhonchi. HEART:  Regular Rhythm; without murmurs/ rubs/ or gallops. ABDOMEN:  Soft & nontender; normal bowel sounds; no organomegaly or masses detected. EXT: without deformities, mild arthritic changes; no varicose veins/ venous insuffic/ or edema. prob heel spurs w/ some plantar fascia pain> advised soaks & stretching... NEURO:  CN's intact;  no focal neuro deficits... DERM:  Lipoma in RUQ/ right flank area... no other lesions noted...  RADIOLOGY DATA:  Reviewed in the EPIC EMR & discussed w/ the patient...  LABORATORY DATA:  Reviewed in the EPIC EMR &  discussed w/ the patient...   Assessment & Plan:    Macular Degen>  Followed by DrHecker/ Rankin & stable per pt hx...  HBP>  Now on Verap, MetopER, HCT- along w/ no salt; & BP appears to be well controlled; she is very active w/ "pickle ball" etc...  DYSLIPIDEMIA>  Stable on diet & Simva20...  Borderline DM>  Hx BS in the 120-130 range yrs ago but stable ~100 for the last 3+yrs on diet alone...  Hx Hypothyroidism>  She weaned off Synthroid several yrs ago & TSH has remained normal, clinically euthyroid as well...  GI>  Divertics, Colon Polyps>  Stable & up to date w/ f/u colon 4/13 from DrPerry==> divertics, no polyps...  LIPOMA>  Removed 10/13 from RLQ area by  DrCornett...  DJD>  She notes some right knee discomfort playing "pickle ball" & is advised to wear knee brace, take Aleve prn (she declines further eval or prescription meds)...  Osteoporosis>  Hx BMD in 2001 w/ TScore =3.2 in Spine; Rx'd Fosamax for 24yrs w/ steady improvement & on Bisphos drug holiday since 7/11 (plan is to repeat BMD); rec to continue supplements w/ Calcium, MVI, Vit D...  ?HEMOCHROMATOSIS Gene carrier >> if the info she provided to [place] is right & daugh has the disease, then both she & husb must be carriers; Hemochromatosis DNA is pending in our lab...  Other medical problems as listed...   Patient's Medications  New Prescriptions   No medications on file  Previous Medications   ASPIRIN 81 MG TABLET    Take 81 mg by mouth daily.   CALCIUM CARBONATE-VITAMIN D (CALCIUM 600+D) 600-400 MG-UNIT PER TABLET    Take 1 tablet by mouth daily.   HYDROCHLOROTHIAZIDE (HYDRODIURIL) 25 MG TABLET    Take 1 tablet (25 mg total) by mouth daily.   METOPROLOL SUCCINATE (TOPROL XL) 25 MG 24 HR TABLET    Take 1 tablet (25 mg total) by mouth daily.   MULTIPLE VITAMINS-MINERALS (MULTIVITAMIN & MINERAL PO)    Take 1 tablet by mouth daily.   SIMVASTATIN (ZOCOR) 20 MG TABLET    Take 1 tablet (20 mg total) by mouth at bedtime.   VERAPAMIL (CALAN-SR) 240 MG CR TABLET    Take 1 tablet (240 mg total) by mouth at bedtime.  Modified Medications   No medications on file  Discontinued Medications   HYDROCODONE-ACETAMINOPHEN (NORCO) 5-325 MG PER TABLET    Take 1 tablet by mouth every 6 (six) hours as needed for pain.

## 2012-05-15 NOTE — Patient Instructions (Addendum)
Today we updated your med list in our EPIC system...    Continue your current medications the same...  We gave you the 2013 Flu vaccine today...  Today we also did some blood work to check for the Hemochromatosis gene...    We will call you w/ the results...  Be sure to bring your BP cuff & drug formulary book to your follow up visits...  Let's plan a recheck in about 4 months, sooner if needed for any reason.Marland KitchenMarland Kitchen

## 2012-07-15 ENCOUNTER — Telehealth: Payer: Self-pay | Admitting: Pulmonary Disease

## 2012-07-15 MED ORDER — LEVOFLOXACIN 500 MG PO TABS: 500.0000 mg | ORAL_TABLET | Freq: Every day | ORAL | Status: DC

## 2012-07-15 NOTE — Telephone Encounter (Signed)
I spoke with pt and she c/o cough w/ green phlem x 1 week. No f/c/s/n/v/chest tx/wheezing. She has been taking hycodan cough syrup w/o relief. She is requesting an ABX to be called in for her. Please advise SN thanks Last OV 05/25/12 Pending 09/17/12 Allergies  Allergen Reactions  . Epinephrine     REACTION: heart racing

## 2012-07-15 NOTE — Telephone Encounter (Signed)
Per SN---sounds like bronchitis---call in levaquin 500 mg   #7  1 daily and give 1 refill.   Pt will need to take align once daily.  thanks

## 2012-07-15 NOTE — Telephone Encounter (Signed)
Pt aware and rx sent. Jennifer Castillo, CMA  

## 2012-08-24 ENCOUNTER — Other Ambulatory Visit: Payer: Self-pay | Admitting: Pulmonary Disease

## 2012-09-17 ENCOUNTER — Ambulatory Visit (INDEPENDENT_AMBULATORY_CARE_PROVIDER_SITE_OTHER): Payer: Medicare Other | Admitting: Pulmonary Disease

## 2012-09-17 ENCOUNTER — Other Ambulatory Visit: Payer: Self-pay | Admitting: Pulmonary Disease

## 2012-09-17 ENCOUNTER — Other Ambulatory Visit (INDEPENDENT_AMBULATORY_CARE_PROVIDER_SITE_OTHER): Payer: Medicare Other

## 2012-09-17 ENCOUNTER — Encounter: Payer: Self-pay | Admitting: Pulmonary Disease

## 2012-09-17 VITALS — BP 140/80 | HR 55 | Temp 97.4°F | Ht 62.0 in | Wt 141.4 lb

## 2012-09-17 DIAGNOSIS — M1990 Unspecified osteoarthritis, unspecified site: Secondary | ICD-10-CM

## 2012-09-17 DIAGNOSIS — D126 Benign neoplasm of colon, unspecified: Secondary | ICD-10-CM

## 2012-09-17 DIAGNOSIS — I10 Essential (primary) hypertension: Secondary | ICD-10-CM

## 2012-09-17 DIAGNOSIS — H3530 Unspecified macular degeneration: Secondary | ICD-10-CM

## 2012-09-17 DIAGNOSIS — R413 Other amnesia: Secondary | ICD-10-CM

## 2012-09-17 DIAGNOSIS — M810 Age-related osteoporosis without current pathological fracture: Secondary | ICD-10-CM

## 2012-09-17 DIAGNOSIS — E039 Hypothyroidism, unspecified: Secondary | ICD-10-CM

## 2012-09-17 DIAGNOSIS — F411 Generalized anxiety disorder: Secondary | ICD-10-CM

## 2012-09-17 DIAGNOSIS — R7309 Other abnormal glucose: Secondary | ICD-10-CM

## 2012-09-17 DIAGNOSIS — K5730 Diverticulosis of large intestine without perforation or abscess without bleeding: Secondary | ICD-10-CM

## 2012-09-17 DIAGNOSIS — E785 Hyperlipidemia, unspecified: Secondary | ICD-10-CM

## 2012-09-17 DIAGNOSIS — Z148 Genetic carrier of other disease: Secondary | ICD-10-CM

## 2012-09-17 LAB — BASIC METABOLIC PANEL
BUN: 23 mg/dL (ref 6–23)
Creatinine, Ser: 0.9 mg/dL (ref 0.4–1.2)
GFR: 64.37 mL/min (ref >60.00)
Glucose, Bld: 106 mg/dL — ABNORMAL HIGH (ref 70–99)

## 2012-09-17 LAB — SEDIMENTATION RATE: Sed Rate: 14 mm/hr (ref 0–22)

## 2012-09-17 LAB — TSH: TSH: 0.37 u[IU]/mL (ref 0.35–5.50)

## 2012-09-17 NOTE — Progress Notes (Addendum)
Subjective:    Patient ID: Cynthia Mathis, female    DOB: 1933/03/25, 79 y.o.   MRN: [id number]  HPI 79 y/o WF here for a follow up visit... she has mult med problems as noted below ...   ~  sees DrPerry for GI- divertics, polyps, hems & a bout of diverticulitis in Mar08...  ~  sees Dr [name] Caprice Urology w/ microscopic hematuria and a neg eval 5/08... 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 ~  sees DrLLomax for Derm w/ alopecia- offered Rogaine but decided against it...  ~  July 31, 2011:  32mo ROV & she has had a recent URI w/ cough, yellow sputum, no hemoptysis, sore chest & abd from the cough, but no f/c/s;  We discussed further eval w/ CXR, labs, and treatment w/ ZPak, Mucinex, Fluids, Delsym, etc... On Lisinopril20- taking 1.5tabs daily + Verelan240; BP= 150/80 & she is reminded to avoid sodium & take meds every day... On Simva20 for Chol w/ good control; FLP today shows TChol 169, TG 118, HDL 56, LDL 89 Hx Thyroid dis (see below) & clinically euthyroid off meds; TSH today = 0.58 Hx Divertics/ polyp & is sched for 18yr f/u colon 3/13 but her polyp was hyperplastic & DrPerry may change his mind... She has DJD & Osteoporosis; using OTC analgesics as needed & currently in the second year of a Bisphos drug holiday... She has signif macular degen followed by DrHecker & Rankin, on eye vits... Due for 1st of the yr FASTING blood work today> all essent wnl...  ~  January 29, 2012:  32mo ROV & [name] reports a good interval> she had a follow up colonoscopy from DrPerry 4/13- severe diverticulosis, but no inflamm change & no polyps seen;  She also had a basal cell skin cancer removed from the left side of her nose by DrLLomax w/ subseq deeper resection & doing satis now;  She reports large lipoma on right flank/ RLQ & she would like to proceed w/ removal by DrCornett, CCS...    She notes some throat clearing & mild cough that might be related to her ACE?Lisinopril Rx> we  discussed this & decided to stop the ACE & change to LOSARTAN 100mg /d along w/ her Verapamil & low salt diet...    We reviewed prob list, meds, xrays and labs> see below for updates >>  ~  May 15, 2012:  3-63mo ROV & [name] has had a prob w/ her BP> when I last saw her she was on Verap240 + Lisinopril30 & BP was ~150/80 but she had a throat clearing tic & mild cough so we switched the ACE to Losartan100; side effect improved somewhat but BP was higher on her home checks & she wanted to go back on the Lisin; she saw TP & her labs showed K=6.0 so the ACE/ ARB was stopped in favor of HCTZ25mg /d; f/u labs showed K back to 5 but home BP checks were up somewhat, therefore ToprolXL25 added;  BP generally better on the combo Verap240, HCT25, MetopER25 & measured 140/80 at day-surg & at DrCornett's office recently & measures 126/72 here today (she says home readings are higher & she will bring cuff to f/u OVs)... She does not HR is slower & measures 50 at rest here today but she is asymptomatic- good energy, not dizzy, no CP/ palpit/ weakness/ SOB/ etc; she plays "pickle ball" regularly & some matches last 3H & she has no difficulty...  In the final analysis she  decided to continue the same meds rather than change them further...    She had her RLQ Lipoma removed surgically as outpt by DrCornett 04/17/12, she is glad to be rid of it...    She also tells me that her daugh in [place] was Dx w/ Hemochromatosis; they didn't have any details but she apparently undergoing phlebotomy now; I explained that both she & her husb would have to be carriers of the gene & we will check her Fe level & Hemochromatosis DNA test...  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  ~  September 17, 2012:  32mo ROV & [name] is concerned about her memory; husb has been under the care of DrLove for >84yrs w/ mild cognitive impairment, her MMSE is abn & we will refer to  [facility] for further assessment & scans... We reviewed the following medical problems during today's office visit >>     Macular Degen> followed by DrHecker & DrRankin...    HBP> on ASA81, Metop25, Verap240, HCT25; BP= 140/80 & she denies HA, CP, palpit, dizzy, SOB, edema, etc...    Hyperlipid> on Simva20; last FLP 1/13 showed TChol 169, TG 118, HDL 56, LDL 89; she is not fasting today...    Hx Impaired FBS> Hx BS in the 130 range, but all readings ~100 x yrs now;  Labs 3/14 showed BS=106, A1c=6.2.Marland KitchenMarland Kitchen    Hypothy> hx multinod thyroid x yrs; had thyroidectomy 1990 by DrWeatherly; she weaned off Thyroid hormone yrs ago & has remained clinically & biochem euthyroid; TSH= 0.37    GI- Divertics, Polyps> hx diverticulitis in 2008- followed by DrPerry; last colonoscopy 4/13 showed severe diverticulosis w/o inflamm or polyps seen...    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    DJD, Osteoporosis> on Ca, MVI, Glucosamine; she uses OTC analgesic meds as needed; see BMDs below- on Bisphos drug holiday since 7/11 & due for f/u BMD...    Memory Loss> she is c/o difficulty w/ name, dates, etc; MMSE is abnormal & we will refer to [facility]...    Anxiety> no requiring meds... We reviewed prob list, meds, xrays and labs> see below for updates >>  LABS 3/14:  Chems- wnl w/ BS=106, A1c=6.2;  TSH=0.37;  Sed=14...             Problem List:  MACULAR DEGENERATION (ICD-362.50) - eval by DrHecker w/ foveal telangiectasia, macular drusen, macular degeneration, and cataracts- being followed regularly... vision is preserved so far... ~  11/11:  f/u DrHecker- stable age related mac degen, no retinopathy. ~  11/12:  f/u DrHecker- has advanced bilat AMD, sent to DrRankin, we don't have his notes...  HYPERTENSION (ICD-401.9) - on VERAPAMIL 240mg /d,  TOPROL-XL 25mg /d,  HCTZ 25mg /d... Prev on Lisinopril but this was stopped due to  throat clearing tic & mild cough, plus K=6.0 at the time (all improved off the ACE)... ~  8/10: borderline BP here and she will monitor at home carefully & call if not <150/90. ~  7/11: she reports that she incr the Lisinopril to 1&1/2 tabs due to BP~150+, now better... ~  7/12:  BP stable on Verap240 & Lisinopril 30mg /d> BP= 140/68 today> denies HA, fatigue, visual changes, CP, palipit, dizziness, syncope, dyspnea, edema, etc... ~  1/13:  BP= 160/90 (didn't take med today); states BP are ok at home; continue same meds + low sodium  etc... ~  CXR 1/13 showed norm heart size, clear lungs, mild thor spondylosis, NAD.[name]. ~  7/13:  BP= 152/82 & she notes some troublesome throat clearing & mild cough; we decided to STOP the ACE & switch to LOSARTAN 100mg /d, monitor her symptoms and her blood presure... ~  11/13: she had long convoluted hx of BP med changes- see above- now on Verap240, MetopER25, HCT25, & BP= 126/72; we discussed options but she is content to continue these same meds for now & watch BP... ~  3/14: on ASA81, Metop25, Verap240, HCT25; BP= 140/80 & she denies HA, CP, palpit, dizzy, SOB, edema, etc.  DYSLIPIDEMIA (ICD-272.4) - on ZOCOR 20mg /d... plus doing well on diet... ~  FLP 6/09 showed TChol 144, TG 140, HDL 39, LDL 77... continue same. ~  FLP 2/10 showed TChol 164, TG 77, HDL 54, LDL 95 ~  FLP 8/10 showed TChol 138, TG 51, HDL 48, LDL 80 ~  FLP 1/11 showed TChol 155, TG 100, HDL 48, LDL 87 ~  FLP 7/11 showed TChol 158, TG 74, HDL 55, LDL 89 ~  FLP 1/12 showed TChol 155, TG 101, HDL 50, LDL 85 ~  FLP 1/13 on Simva20 showed TChol 169, TG 118, HDL 56, LDL 89   Hx Impaired Fasting Glucose >> on diet alone... FBS @ home betw 120-130 recently... she is on a good diet and weight is down at 139# today (63"  BMI= 24)... ~  labs 3/09 showed FBS= 119... subseq HgA1c 5/09 was 6.4... diet Rx. ~  labs 8/09 showed BS= 106, HgA1c= 5.9.Marland KitchenMarland Kitchen rec- same. ~  labs 2/10 showed BS= 102, A1c= 5.9 ~  labs 1/11  showed BS= 98 ~  labs 1/12 showed BS= 95 ~  Labs 1/13 showed BS= 101 ~  Labs 3/14 showed BS=106, A1c=6.2  HYPOTHYROIDISM (ICD-244.9) - off Synthroid now...  clinically & biochemically euthyroid... remote hx of overactive thyroid many yrs ago in [place] (we don't have records)... she had an eval by DrKohut in 1987 w/ familial multinodular goiter w/ neg FNA... started on Rx but goiter enlarged- had thyroidectomy 1990 by DrWeatherly... ~  labs 3/09 showed TSH = 0.35 (0.35-5.5)... we have slowly decr her dose to 60mcg/d... ~  labs 8/09 showed TSH= 0.33... rec- decr the Synth50 to 1/2 tab daily. ~  labs 2/10 showed =TSH= 0.54 and Synthroid stopped... ~  labs 4/10 off med showed TSH= 0.59 ~  labs 8/10 showed TSH= 0.60 ~  labs 1/11 showed TSH= 0.60 ~  labs 7/11 showed TSH= 0.56 ~  labs 1/12 showed TSH= 0.60 ~  Labs 1/13 showed TSH= 0.58 ~  Labs 3/14 showed TSH= 0.37  DIVERTICULOSIS OF COLON (ICD-562.10) COLONIC POLYPS (ICD-211.3) - followed by DrPerry... bout of diverticulitis in Mar08... ~  last colonoscopy 3/08 showed divertics, 2mm polyp, hems... path=hyperplastic, f/u planned 21yrs. ~  F/u colonoscopy 4/13 by DrPerry showed severe diverticulosis w/o inflamm and w/o polyps etc...  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  HEMATURIA, HX OF (ICD-V13.09) - eval 4/08 by DrGrapey w/ neg Sonar, Cysto, cytology...  DEGENERATIVE JOINT DISEASE (ICD-715.90) - c/o discomfort in her shoulders and knees... uses OTC meds as needed.  Hx of OSTEOPOROSIS (ICD-733.00) - on Fosamax 70/wk thru 7/11, Caltrate 1/d, MVI, Vit D 1000/d... ~  initial BMD 10/01 showed TScores -0.2  to -3.2 in the spine...  ~  f/u studies w/ steady improvement on Fosamax therapy... ~  BMD 3/09 w/ TScores -0.1 to -1.7 in the spine...  continue Rx. ~  BMD 7/11 showed TScores -2.0 Spine, and -0.3 in right FemNeck... we opted for Bisphos drug holiday to start now. ~  Labs 1/13 showed Vit D level = 48, continue same...  ? of MEMORY LOSS (ICD-780.93) - MMSE 5/09 by Shanon Brow, NP was 29/30... Reassured. ~  3/14: she presented w/ c/o worsening memory & MMSE has diminished- refer to [facility] for eval & rx...  ANXIETY (ICD-300.00) - prev on Alpraz Prn but hasn't used in yrs...  DERMATOLOGY >> LIPOMA (ICD-214.9) - notes large lipoma in RLQ/ rt flank area... some discomfort noted... seen by DrLeone in the past w/o surg... she saw DrCornett, CCS, 1/11 for second opinion & she will decide re: excision... ~  5/13:  She had Basal cell ca removed from the left side of her nose, w/ subseq deeper margins & reports everything is OK... ~  7/13:  She is concerned that the lipoma may be growing & she requests f/u DrCornett for excision... ~  10/13:  Lipoma removed by DrCornett in outpt surg 04/17/12...  SHINGLES - remote hx of right T8-9 shingles in 1986...   Past Surgical History  Procedure Laterality Date  . Thyroidectomy  1990    Dr. Zachery Dakins  . Appendectomy  1947  . Colonoscopy    . Mass excision  04/17/2012    Procedure: EXCISION MASS;  Surgeon: Clovis Pu. Cornett, MD;  Location: [facility];  Service: General;  Laterality: N/A;     Outpatient Encounter Prescriptions as of 09/17/2012  Medication Sig Dispense Refill  . aspirin 81 MG tablet Take 81 mg by mouth daily.      . Calcium Carbonate-Vitamin D (CALCIUM 600+D) 600-400 MG-UNIT per tablet Take 1 tablet by mouth daily.      . fish oil-omega-3 fatty acids 1000 MG capsule Take 1 g by mouth daily.      [name] glucosamine-chondroitin 500-400 MG tablet Take 1 tablet by mouth 3 (three) times daily.      . hydrochlorothiazide (HYDRODIURIL) 25 MG tablet Take 1 tablet (25 mg total) by mouth daily.  30 tablet  5  . metoprolol succinate (TOPROL XL) 25 MG 24 hr tablet Take 1  tablet (25 mg total) by mouth daily.  30 tablet  PRN  . Multiple Vitamins-Minerals (MULTIVITAMIN & MINERAL PO) Take 1 tablet by mouth daily.      . simvastatin (ZOCOR) 20 MG tablet TAKE 1 TABLET (20 MG TOTAL) BY MOUTH AT BEDTIME.  90 tablet  2  . verapamil (CALAN-SR) 240 MG CR tablet TAKE 1 TABLET (240 MG TOTAL)  BY MOUTH AT BEDTIME.  90 tablet  2  . [DISCONTINUED] levofloxacin (LEVAQUIN) 500 MG tablet Take 1 tablet (500 mg total) by mouth daily.  7 tablet  1   No facility-administered encounter medications on file as of 09/17/2012.    Allergies  Allergen Reactions  . Epinephrine     REACTION: heart racing    Current Medications, Allergies, Past Medical History, Past Surgical History, Family History, and Social History were reviewed in [employer] record.    Review of Systems        See HPI - all other systems neg except as noted...  The patient complains of dyspnea on exertion.  The patient denies anorexia, fever, weight loss, weight gain, vision loss,  decreased hearing, hoarseness, chest pain, syncope, peripheral edema, prolonged cough, headaches, hemoptysis, abdominal pain, melena, hematochezia, severe indigestion/heartburn, hematuria, incontinence, muscle weakness, suspicious skin lesions, transient blindness, difficulty walking, depression, unusual weight change, abnormal bleeding, enlarged lymph nodes, and angioedema.   Objective:   Physical Exam    WD, WN, 79 y/o WF in NAD... VITAL SIGNS:  Reviewed... GENERAL:  Alert & oriented; pleasant & cooperative... HEENT:  [place]/AT, EOM-wnl, PERRLA, EACs-clear, TMs-wnl, NOSE-clear, THROAT-clear & wnl. NECK:  Supple w/ fair ROM; no JVD; normal carotid impulses w/o bruits; scar of prev thyroid surg- no nodules or goiter; no lymphadenopathy. CHEST:  Clear to P & A; without wheezes/ rales/ or rhonchi. HEART:  Regular Rhythm; without murmurs/ rubs/ or gallops. ABDOMEN:  Soft & nontender; normal bowel sounds; no  organomegaly or masses detected. EXT: without deformities, mild arthritic changes; no varicose veins/ venous insuffic/ or edema. prob heel spurs w/ some plantar fascia pain> advised soaks & stretching... NEURO:  CN's intact;  no focal neuro deficits... DERM:  Scar in right flank after lipoma excision... no other lesions noted...  MMSE 09/17/12>  She did not know the Pres ("I can see him, a black guy"), VP, or Gov;  Didn't know prev pres etc;  Recall 3/3 immed, but only 1/3 after distraction;  Serial 7s & WORLD backwards were fluent; proverbs were abstracted normally...   RADIOLOGY DATA:  Reviewed in the EPIC EMR & discussed w/ the patient...  LABORATORY DATA:  Reviewed in the EPIC EMR & discussed w/ the patient...   Assessment & Plan:    Macular Degen>  Followed by DrHecker/ Rankin & stable per pt hx...  HBP>  Now on Verap, MetopER, HCT- along w/ no salt; & BP appears to be well controlled; she is very active w/ "pickle ball" etc...  DYSLIPIDEMIA>  Stable on diet & Simva20...  Borderline DM>  Hx BS in the 120-130 range yrs ago but stable ~100 for the last 3+yrs on diet alone...  Hx Hypothyroidism>  She weaned off Synthroid several yrs ago & TSH has remained normal, clinically euthyroid as well...  GI>  Divertics, Colon Polyps>  Stable & up to date w/ f/u colon 4/13 from DrPerry==> divertics, no polyps...  LIPOMA>  Removed 10/13 from RLQ area by DrCornett...  DJD>  She notes some right knee discomfort playing "pickle ball" & is advised to wear knee brace, take Aleve prn (she declines further eval or prescription meds)...  Osteoporosis>  Hx BMD in 2001 w/ TScore =3.2 in Spine; Rx'd Fosamax for 45yrs w/ steady improvement & on Bisphos drug holiday since 7/11 (plan is to repeat BMD); rec to continue supplements w/ Calcium, MVI, Vit D...  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  MEMORY  LOSS>  More noticeable memory decline; refer to [facility]...  Other medical problems as listed...   Patient's Medications  New Prescriptions   No medications on file  Previous Medications   ASPIRIN 81 MG TABLET    Take 81 mg by mouth daily.   CALCIUM CARBONATE-VITAMIN D (CALCIUM 600+D) 600-400 MG-UNIT PER TABLET    Take 1 tablet by mouth daily.   FISH OIL-OMEGA-3 FATTY ACIDS 1000 MG CAPSULE    Take 1 g by mouth daily.   GLUCOSAMINE-CHONDROITIN 500-400 MG TABLET    Take 1 tablet by mouth 3 (three) times daily.   HYDROCHLOROTHIAZIDE (HYDRODIURIL) 25 MG TABLET    Take 1 tablet (25 mg  total) by mouth daily.   METOPROLOL SUCCINATE (TOPROL XL) 25 MG 24 HR TABLET    Take 1 tablet (25 mg total) by mouth daily.   MULTIPLE VITAMINS-MINERALS (MULTIVITAMIN & MINERAL PO)    Take 1 tablet by mouth daily.   SIMVASTATIN (ZOCOR) 20 MG TABLET    TAKE 1 TABLET (20 MG TOTAL) BY MOUTH AT BEDTIME.   VERAPAMIL (CALAN-SR) 240 MG CR TABLET    TAKE 1 TABLET (240 MG TOTAL)  BY MOUTH AT BEDTIME.  Modified Medications   No medications on file  Discontinued Medications   LEVOFLOXACIN (LEVAQUIN) 500 MG TABLET    Take 1 tablet (500 mg total) by mouth daily.

## 2012-09-17 NOTE — Patient Instructions (Addendum)
Today we updated your med list in our EPIC system...    Continue your current medications the same...    We refilled your meds per request...  Today we did some follow up blood work...    We will contact you w/ the results when avail...  We will arrange for a NEUROLOGY eval to check your memory...  Call for any questions or concerns...  Let's plan a similar follow up visit in 4 months.Marland KitchenMarland KitchenMarland Kitchen

## 2012-09-19 ENCOUNTER — Other Ambulatory Visit: Payer: Self-pay | Admitting: *Deleted

## 2012-09-19 MED ORDER — HYDROCHLOROTHIAZIDE 25 MG PO TABS: 25.0000 mg | ORAL_TABLET | Freq: Every day | ORAL | Status: DC

## 2012-10-07 ENCOUNTER — Encounter: Payer: Self-pay | Admitting: Diagnostic Neuroimaging

## 2012-10-07 ENCOUNTER — Ambulatory Visit (INDEPENDENT_AMBULATORY_CARE_PROVIDER_SITE_OTHER): Payer: Medicare Other | Admitting: Diagnostic Neuroimaging

## 2012-10-07 VITALS — BP 157/73 | HR 58 | Temp 97.9°F | Ht 64.0 in | Wt 141.0 lb

## 2012-10-07 DIAGNOSIS — R6889 Other general symptoms and signs: Secondary | ICD-10-CM

## 2012-10-07 DIAGNOSIS — R413 Other amnesia: Secondary | ICD-10-CM

## 2012-10-07 NOTE — Patient Instructions (Signed)
I will check MRI and B12 level. Eat fresh vegetables and fruits and maintain physical activity.

## 2012-10-07 NOTE — Progress Notes (Signed)
[facility]  PATIENT: Cynthia Mathis DOB: 02-Jan-1933  REFERRING CLINICIAN: Nadel HISTORY FROM: patient and husband REASON FOR VISIT: new consult   HISTORICAL  CHIEF COMPLAINT:  Chief Complaint  Patient presents with  . Memory Loss    HISTORY OF PRESENT ILLNESS:   79 year old right-handed female here for evaluation of memory loss. Patient accompanied by her husband.  For past one year patient is having increasing trouble with forgetting things. She's having to rely on taking notes, maintaining a calendar, using lists to remember things. Patient accompanied by her husband, who also has been diagnosed with mild cognitive impairment versus mild dementia by Dr. Sandria Manly. He takes galantamine, which he says has helped his memory.  Other examples include forgetting recent conversations, recent events, repeating questions over and over. Unfortunately the only informant of this problem is the patient's husband, who also happens to have mild cognitive impairment or dementia. No other family members or friends have noticed this problem in the patient. Otherwise patient is doing well still able to maintain activities of daily living including cooking, cleaning, driving, shopping.  REVIEW OF SYSTEMS: Full 14 system review of systems performed and notable only for hearing loss, memory loss, mild anxiety. Otherwise negative.  ALLERGIES: Allergies  Allergen Reactions  . Epinephrine     REACTION: heart racing    HOME MEDICATIONS: Outpatient Prescriptions Prior to Visit  Medication Sig Dispense Refill  . aspirin 81 MG tablet Take 81 mg by mouth daily.      . Calcium Carbonate-Vitamin D (CALCIUM 600+D) 600-400 MG-UNIT per tablet Take 1 tablet by mouth daily.      . fish oil-omega-3 fatty acids 1000 MG capsule Take 1 g by mouth daily.      [name] glucosamine-chondroitin 500-400 MG tablet Take 1 tablet by mouth 3 (three) times daily.      . hydrochlorothiazide (HYDRODIURIL) 25 MG  tablet Take 1 tablet (25 mg total) by mouth daily.  30 tablet  11  . metoprolol succinate (TOPROL XL) 25 MG 24 hr tablet Take 1 tablet (25 mg total) by mouth daily.  30 tablet  PRN  . Multiple Vitamins-Minerals (MULTIVITAMIN & MINERAL PO) Take 1 tablet by mouth daily.      . simvastatin (ZOCOR) 20 MG tablet TAKE 1 TABLET (20 MG TOTAL) BY MOUTH AT BEDTIME.  90 tablet  2  . verapamil (CALAN-SR) 240 MG CR tablet TAKE 1 TABLET (240 MG TOTAL)  BY MOUTH AT BEDTIME.  90 tablet  2   No facility-administered medications prior to visit.    PAST MEDICAL HISTORY: Past Medical History  Diagnosis Date  . Macular degeneration   . Hypertension   . Dyslipidemia   . Borderline diabetes mellitus   . Hypothyroid   . Diverticulosis of colon   . Hx of colonic polyps   . History of hematuria   . DJD (degenerative joint disease)   . History of osteoporosis   . Memory loss   . Anxiety   . Lipoma   . Hyperlipidemia     PAST SURGICAL HISTORY: Past Surgical History  Procedure Laterality Date  . Thyroidectomy  1990    Dr. Zachery Dakins  . Appendectomy  1947  . Colonoscopy    . Mass excision  04/17/2012    Procedure: EXCISION MASS;  Surgeon: Clovis Pu. Cornett, MD;  Location: [facility];  Service: General;  Laterality: N/A;    FAMILY HISTORY: Family History  Problem Relation Age of Onset  . Colon cancer  Neg Hx   . Stomach cancer Neg Hx   . Stroke Mother     SOCIAL HISTORY:  History   Social History  . Marital Status: Married    Spouse Name: Rande Lawman Hassey    Number of Children: 3  . Years of Education: High Sch[employer]   Occupational History  . seamstress    Social History Main Topics  . Smoking status: Never Smoker   . Smokeless tobacco: Never Used  . Alcohol Use: Yes     Comment: occasionally  . Drug Use: No  . Sexually Active: Not on file   Other Topics Concern  . Not on file   Social History Narrative   Pt lives at home with her spouse Rande Lawman Bordner, who has been dx'd  with mild cognitive impairment vs. mild dementia, former pt of Dr. Sandria Manly). She does not use caffeine, if so, rarely.     PHYSICAL EXAM  Filed Vitals:   10/07/12 1143  BP: 157/73  Pulse: 58  Temp: 97.9 F (36.6 C)  TempSrc: Oral  Height: 5\' 4"  (1.626 m)  Weight: 141 lb (63.957 kg)   Body mass index is 24.19 kg/(m^2).  GENERAL EXAM: Patient is in no distress  CARDIOVASCULAR: Regular rate and rhythm, no murmurs, no carotid bruits  NEUROLOGIC: MENTAL STATUS: awake, alert, language fluent, comprehension intact, naming intact; MMSE 29/30. MOCA 21/30. AFT 11. GDS 0. CRANIAL NERVE: no papilledema on fundoscopic exam, pupils equal and reactive to light, visual fields full to confrontation, extraocular muscles intact, no nystagmus, facial sensation and strength symmetric, uvula midline, shoulder shrug symmetric, tongue midline. MOTOR: normal bulk and tone, full strength in the BUE, BLE SENSORY: normal and symmetric to light touch, pinprick, temperature, vibration. COORDINATION: finger-nose-finger, fine finger movements normal REFLEXES: deep tendon reflexes present and symmetric GAIT/STATION: narrow based gait; able to walk on toes, heels and tandem; romberg is negative   DIAGNOSTIC DATA (LABS, IMAGING, TESTING) - I reviewed patient records, labs, notes, testing and imaging myself where available.  Lab Results  Component Value Date   WBC 10.8* 05/15/2012   HGB 14.5 05/15/2012   HCT 44.6 05/15/2012   MCV 91.8 05/15/2012   PLT 232.0 05/15/2012      Component Value Date/Time   NA 141 09/17/2012 1629   K 3.7 09/17/2012 1629   CL 103 09/17/2012 1629   CO2 33* 09/17/2012 1629   GLUCOSE 106* 09/17/2012 1629   BUN 23 09/17/2012 1629   CREATININE 0.9 09/17/2012 1629   CALCIUM 9.4 09/17/2012 1629   PROT 6.9 04/15/2012 1500   ALBUMIN 3.8 04/15/2012 1500   AST 20 04/15/2012 1500   ALT 14 04/15/2012 1500   ALKPHOS 77 04/15/2012 1500   BILITOT 0.4 04/15/2012 1500   GFRNONAA 67* 04/15/2012 1500    GFRAA 78* 04/15/2012 1500   Lab Results  Component Value Date   CHOL 169 07/31/2011   HDL 56.10 07/31/2011   LDLCALC 89 07/31/2011   TRIG 118.0 07/31/2011   CHOLHDL 3 07/31/2011   Lab Results  Component Value Date   HGBA1C 6.2 09/17/2012   Lab Results  Component Value Date   VITAMINB12 708 11/12/2007   Lab Results  Component Value Date   TSH 0.37 09/17/2012     ASSESSMENT AND PLAN  79 y.o. year old female  has a past medical history of Macular degeneration; Hypertension; Dyslipidemia; Borderline diabetes mellitus; Hypothyroid; Diverticulosis of colon; colonic polyps; History of hematuria; DJD (degenerative joint disease); History of osteoporosis; Memory loss; Anxiety; Lipoma;  and Hyperlipidemia. here with one-year history of short-term memory problems. Neurologic examination today is unremarkable. MMSE 29/30. MOCA 21/30. Differential diagnosis includes mild cognitive impairment, metabolic, normal aging. I will check MRI of the brain and vitamin B12 level. I do not recommend medications for dementia at this time.  Orders Placed This Encounter  Procedures  . MR Brain Wo Contrast  . Vitamin B12    Suanne Marker, MD 10/07/2012, 12:17 PM Certified in Neurology, Neurophysiology and Neuroimaging  [facility] [address] [place], [place] [address] [phone]

## 2012-10-08 LAB — VITAMIN B12: Vitamin B-12: 819 pg/mL (ref 211–946)

## 2012-10-14 ENCOUNTER — Encounter: Payer: Self-pay | Admitting: Pulmonary Disease

## 2012-10-16 ENCOUNTER — Ambulatory Visit
Admission: RE | Admit: 2012-10-16 | Discharge: 2012-10-16 | Disposition: A | Discharge status: Home / Self Care | Payer: Medicare Other | Source: Ambulatory Visit | Attending: Diagnostic Neuroimaging | Admitting: Diagnostic Neuroimaging

## 2012-10-16 DIAGNOSIS — R413 Other amnesia: Secondary | ICD-10-CM

## 2012-10-16 DIAGNOSIS — R6889 Other general symptoms and signs: Secondary | ICD-10-CM

## 2013-01-17 ENCOUNTER — Ambulatory Visit: Payer: Medicare Other | Admitting: Pulmonary Disease

## 2013-01-23 ENCOUNTER — Encounter: Payer: Self-pay | Admitting: Pulmonary Disease

## 2013-01-23 ENCOUNTER — Ambulatory Visit (INDEPENDENT_AMBULATORY_CARE_PROVIDER_SITE_OTHER): Payer: Medicare Other | Admitting: Pulmonary Disease

## 2013-01-23 VITALS — BP 142/72 | HR 54 | Temp 97.9°F | Ht 62.0 in | Wt 138.8 lb

## 2013-01-23 DIAGNOSIS — M1990 Unspecified osteoarthritis, unspecified site: Secondary | ICD-10-CM

## 2013-01-23 DIAGNOSIS — I10 Essential (primary) hypertension: Secondary | ICD-10-CM

## 2013-01-23 DIAGNOSIS — E785 Hyperlipidemia, unspecified: Secondary | ICD-10-CM

## 2013-01-23 DIAGNOSIS — E039 Hypothyroidism, unspecified: Secondary | ICD-10-CM

## 2013-01-23 DIAGNOSIS — R413 Other amnesia: Secondary | ICD-10-CM

## 2013-01-23 DIAGNOSIS — Z148 Genetic carrier of other disease: Secondary | ICD-10-CM

## 2013-01-23 DIAGNOSIS — R7309 Other abnormal glucose: Secondary | ICD-10-CM

## 2013-01-23 DIAGNOSIS — K5730 Diverticulosis of large intestine without perforation or abscess without bleeding: Secondary | ICD-10-CM

## 2013-01-23 DIAGNOSIS — D126 Benign neoplasm of colon, unspecified: Secondary | ICD-10-CM

## 2013-01-23 DIAGNOSIS — M810 Age-related osteoporosis without current pathological fracture: Secondary | ICD-10-CM

## 2013-01-23 MED ORDER — HYDROCHLOROTHIAZIDE 25 MG PO TABS: 25.0000 mg | ORAL_TABLET | Freq: Every day | ORAL | Status: DC

## 2013-01-23 MED ORDER — SIMVASTATIN 20 MG PO TABS: ORAL_TABLET | ORAL | Status: DC

## 2013-01-23 MED ORDER — VERAPAMIL HCL ER 240 MG PO TBCR: EXTENDED_RELEASE_TABLET | ORAL | Status: DC

## 2013-01-23 MED ORDER — METOPROLOL SUCCINATE ER 25 MG PO TB24: 25.0000 mg | ORAL_TABLET | Freq: Every day | ORAL | Status: DC

## 2013-01-23 NOTE — Progress Notes (Addendum)
Subjective:    Patient ID: Cynthia Mathis, female    DOB: 12-13-1932, 79 y.o.   MRN: [id number]  HPI 79 y/o WF here for a follow up visit... she has mult med problems as noted below ...   ~  sees Cynthia Mathis for GI- divertics, polyps, hems & a bout of diverticulitis in Mar08...  ~  sees Cynthia Mathis Urology w/ microscopic hematuria and a neg eval 5/08... ~  sees Cynthia Mathis for [employer] w/ macular degen- notes reviewed... ~  sees Cynthia Mathis for Derm w/ alopecia- offered Rogaine but decided against it...  ~  July 31, 2011:  45mo ROV & she has had a recent URI w/ cough, yellow sputum, no hemoptysis, sore chest & abd from the cough, but no f/c/s;  We discussed further eval w/ CXR, labs, and treatment w/ ZPak, Mucinex, Fluids, Delsym, etc... On Lisinopril20- taking 1.5tabs daily + Verelan240; BP= 150/80 & she is reminded to avoid sodium & take meds every day... On Simva20 for Chol w/ good control; FLP today shows TChol 169, TG 118, HDL 56, LDL 89 Hx Thyroid dis (see below) & clinically euthyroid off meds; TSH today = 0.58 Hx Divertics/ polyp & is sched for 51yr f/u colon 3/13 but her polyp was hyperplastic & Cynthia Mathis may change his mind... She has DJD & Osteoporosis; using OTC analgesics as needed & currently in the second year of a Bisphos drug holiday... She has signif macular degen followed by Cynthia Mathis & [facility], on eye vits... Due for 1st of the yr FASTING blood work today> all essent wnl...  ~  January 29, 2012:  45mo ROV & Cynthia Mathis reports a good interval> she had a follow up colonoscopy from Cynthia Mathis 4/13- severe diverticulosis, but no inflamm change & no polyps seen;  She also had a basal cell skin cancer removed from the left side of her nose by Cynthia Mathis w/ subseq deeper resection & doing satis now;  She reports large lipoma on right flank/ RLQ & she would like to proceed w/ removal by Cynthia Mathis, CCS...    She notes some throat clearing & mild cough that might be related to her ACE?Lisinopril Rx> we  discussed this & decided to stop the ACE & change to LOSARTAN 100mg /d along w/ her Verapamil & low salt diet...    We reviewed prob list, meds, xrays and labs> see below for updates >>  ~  May 15, 2012:  3-63mo ROV & Cynthia Mathis has had a prob w/ her BP> when I last saw her she was on Verap240 + Lisinopril30 & BP was ~150/80 but she had a throat clearing tic & mild cough so we switched the ACE to Losartan100; side effect improved somewhat but BP was higher on her home checks & she wanted to go back on the Lisin; she saw Cynthia Mathis & her labs showed K=6.0 so the ACE/ ARB was stopped in favor of HCTZ25mg /d; f/u labs showed K back to 5 but home BP checks were up somewhat, therefore Cynthia Mathis added;  BP generally better on the combo Verap240, Cynthia Mathis, Cynthia Mathis & measured 140/80 at day-surg & at Cynthia Mathis's office recently & measures 126/72 here today (she says home readings are higher & she will bring cuff to f/u OVs)... She does not HR is slower & measures 50 at rest here today but she is asymptomatic- good energy, not dizzy, no CP/ palpit/ weakness/ SOB/ etc; she plays "pickle ball" regularly & some matches last 3H & she has no difficulty...  In the final analysis she  decided to continue the same meds rather than change them further...    She had her RLQ Lipoma removed surgically as outpt by Cynthia Mathis 04/17/12, she is glad to be rid of it...    She also tells me that her daugh in [place] was Dx w/ Hemochromatosis; they didn't have any details but she apparently undergoing phlebotomy now; I explained that both she & her husb would have to be carriers of the gene & we will check her Fe level & Hemochromatosis DNA test...  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  ~  September 17, 2012:  357mo ROV & Cynthia Mathis is concerned about her memory; husb has been under the care of Cynthia Mathis for >3yrs w/ mild cognitive impairment, her MMSE is abn & we will refer to  Cynthia Mathis for further assessment & scans... We reviewed the following medical problems during today's office visit >>     Macular Degen> followed by Cynthia Mathis & Cynthia Mathis...    HBP> on ASA81, Metop25, Verap240, Cynthia Mathis; BP= 140/80 & she denies HA, CP, palpit, dizzy, SOB, edema, etc...    Hyperlipid> on Simva20; last FLP 1/13 showed TChol 169, TG 118, HDL 56, LDL 89; she is not fasting today...    Hx Impaired FBS> Hx BS in the 130 range, but all readings ~100 x yrs now;  Labs 3/14 showed BS=106, A1c=6.2.Marland KitchenMarland Kitchen    Hypothy> hx multinod thyroid x yrs; had thyroidectomy 1990 by Cynthia Mathis; she weaned off Thyroid hormone yrs ago & has remained clinically & biochem euthyroid; TSH= 0.37    GI- Divertics, Polyps> hx diverticulitis in 2008- followed by Cynthia Mathis; last colonoscopy 4/13 showed severe diverticulosis w/o inflamm or polyps seen...    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    DJD, Osteoporosis> on Ca, MVI, Glucosamine; she uses OTC analgesic meds as needed; see BMDs below- on Bisphos drug holiday since 7/11 & due for f/u BMD...    Memory Loss> she is c/o difficulty w/ name, dates, etc; MMSE is abnormal & we will refer to Cynthia Mathis...    Anxiety> no requiring meds... We reviewed prob list, meds, xrays and labs> see below for updates >>  LABS 3/14:  Chems- wnl w/ BS=106, A1c=6.2;  TSH=0.37;  Sed=14...    ~  January 23, 2013:  357mo ROV & Cynthia Mathis notes that her memory is slipping some & her hearing is not as good;  She had f/u visit w/ Cynthia Mathis 3/14> her MMSE was 29/30 & he planned to check MRI Brain & B12 level, he did not rec starting meds yet (husb takes Razadyne & feels it helps him); he does rec fresh fruits & vegetables + physical exercise...    BP is controlled on Cynthia Mathis, CalanSR240, & Cynthia Mathis; BP= 142/78 & she denies HA, CP, palpit, SOB, edema...    Chol is controlled on Simva20 + Fish Oil; Last FLP was  1/13 as she cannot seem to remember to come fasting for blood work (see below)...    She remains on a bisphos drug holiday & needs f/u BMD soon to re-assess...  We reviewed prob list, meds, xrays and labs> see below for updates >>   ADDENDUM 04/15/13 >> pt called & wants off Simva20 after reading something about memory loss=> change to Atorva20...           Problem List:  MACULAR DEGENERATION (ICD-362.50) -  eval by Cynthia Mathis w/ foveal telangiectasia, macular drusen, macular degeneration, and cataracts- being followed regularly... vision is preserved so far... ~  11/11:  f/u Cynthia Mathis- stable age related mac degen, no retinopathy. ~  11/12:  f/u Cynthia Mathis- has advanced bilat AMD, sent to Cynthia Mathis, we don't have his notes...  HYPERTENSION (ICD-401.9) - on VERAPAMIL 240mg /d,  TOPROL-XL 25mg /d,  HCTZ 25mg /d... Prev on Lisinopril but this was stopped due to throat clearing tic & mild cough, plus K=6.0 at the time (all improved off the ACE)... ~  8/10: borderline BP here and she will monitor at home carefully & call if not <150/90. ~  7/11: she reports that she incr the Lisinopril to 1&1/2 tabs due to BP~150+, now better... ~  7/12:  BP stable on Verap240 & Lisinopril 30mg /d> BP= 140/68 today> denies HA, fatigue, visual changes, CP, palipit, dizziness, syncope, dyspnea, edema, etc... ~  1/13:  BP= 160/90 (didn't take med today); states BP are ok at home; continue same meds + low sodium etc... ~  CXR 1/13 showed norm heart size, clear lungs, mild thor spondylosis, NAD.[name]. ~  7/13:  BP= 152/82 & she notes some troublesome throat clearing & mild cough; we decided to STOP the ACE & switch to LOSARTAN 100mg /d, monitor her symptoms and her blood presure... ~  11/13: she had long convoluted hx of BP med changes- see above- now on Verap240, Cynthia Mathis, Cynthia Mathis, & BP= 126/72; we discussed options but she is content to continue these same meds for now & watch BP... ~  3/14: on ASA81, Metop25, Verap240, Cynthia Mathis; BP=  140/80 & she denies HA, CP, palpit, dizzy, SOB, edema, etc.  DYSLIPIDEMIA (ICD-272.4) - on ZOCOR 20mg /d... plus doing well on diet... ~  FLP 6/09 showed TChol 144, TG 140, HDL 39, LDL 77... continue same. ~  FLP 2/10 showed TChol 164, TG 77, HDL 54, LDL 95 ~  FLP 8/10 showed TChol 138, TG 51, HDL 48, LDL 80 ~  FLP 1/11 showed TChol 155, TG 100, HDL 48, LDL 87 ~  FLP 7/11 showed TChol 158, TG 74, HDL 55, LDL 89 ~  FLP 1/12 showed TChol 155, TG 101, HDL 50, LDL 85 ~  FLP 1/13 on Simva20 showed TChol 169, TG 118, HDL 56, LDL 89   Hx Impaired Fasting Glucose >> on diet alone... FBS @ home betw 120-130 recently... she is on a good diet and weight is down at 139# today (63"  BMI= 24)... ~  labs 3/09 showed FBS= 119... subseq HgA1c 5/09 was 6.4... diet Rx. ~  labs 8/09 showed BS= 106, HgA1c= 5.9.Marland KitchenMarland Kitchen rec- same. ~  labs 2/10 showed BS= 102, A1c= 5.9 ~  labs 1/11 showed BS= 98 ~  labs 1/12 showed BS= 95 ~  Labs 1/13 showed BS= 101 ~  Labs 3/14 showed BS=106, A1c=6.2  HYPOTHYROIDISM (ICD-244.9) - off Synthroid now...  clinically & biochemically euthyroid... remote hx of overactive thyroid many yrs ago in [place] (we don't have records)... she had an eval by DrKohut in 1987 w/ familial multinodular goiter w/ neg FNA... started on Rx but goiter enlarged- had thyroidectomy 1990 by Cynthia Mathis... ~  labs 3/09 showed TSH = 0.35 (0.35-5.5)... we have slowly decr her dose to 34mcg/d... ~  labs 8/09 showed TSH= 0.33... rec- decr the Synth50 to 1/2 tab daily. ~  labs 2/10 showed =TSH= 0.54 and Synthroid stopped... ~  labs 4/10 off med showed TSH= 0.59 ~  labs 8/10 showed TSH= 0.60 ~  labs 1/11 showed TSH=  0.60 ~  labs 7/11 showed TSH= 0.56 ~  labs 1/12 showed TSH= 0.60 ~  Labs 1/13 showed TSH= 0.58 ~  Labs 3/14 showed TSH= 0.37  DIVERTICULOSIS OF COLON (ICD-562.10) COLONIC POLYPS (ICD-211.3) - followed by Cynthia Mathis... bout of diverticulitis in Mar08... ~  last colonoscopy 3/08 showed divertics, 2mm polyp,  hems... path=hyperplastic, f/u planned 17yrs. ~  F/u colonoscopy 4/13 by Cynthia Mathis showed severe diverticulosis w/o inflamm and w/o polyps etc...  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  HEMATURIA, HX OF (ICD-V13.09) - eval 4/08 by DrGrapey w/ neg Sonar, Cysto, cytology...  DEGENERATIVE JOINT DISEASE (ICD-715.90) - c/o discomfort in her shoulders and knees... uses OTC meds as needed.  Hx of OSTEOPOROSIS (ICD-733.00) - on Fosamax 70/wk thru 7/11, Caltrate 1/d, MVI, Vit D 1000/d... ~  initial BMD 10/01 showed TScores -0.2  to -3.2 in the spine...  ~  f/u studies w/ steady improvement on Fosamax therapy... ~  BMD 3/09 w/ TScores -0.1 to -1.7 in the spine... continue Rx. ~  BMD 7/11 showed TScores -2.0 Spine, and -0.3 in right FemNeck... we opted for Bisphos drug holiday to start now. ~  Labs 1/13 showed Vit D level = 48, continue same...  ? of MEMORY LOSS (ICD-780.93) - MMSE 5/09 by Shanon Brow, NP was 29/30... Reassured. ~  3/14: she presented w/ c/o worsening memory & MMSE has diminished- refer to [facility] for eval & rx...  ANXIETY (ICD-300.00) - prev on Alpraz Prn but hasn't used in yrs...  DERMATOLOGY >> LIPOMA (ICD-214.9) - notes large lipoma in RLQ/ rt flank area... some discomfort noted... seen by DrLeone in the past w/o surg... she saw Cynthia Mathis, CCS, 1/11 for second opinion & she will decide re: excision... ~  5/13:  She had Basal cell ca removed from the left side of her nose, w/ subseq deeper margins & reports everything is OK... ~  7/13:  She is concerned that the lipoma may be growing & she requests f/u Cynthia Mathis for excision... ~  10/13:  Lipoma removed by Cynthia Mathis in outpt surg 04/17/12...  SHINGLES - remote hx of right T8-9 shingles in 1986...   Past Surgical  History  Procedure Laterality Date  . Thyroidectomy  1990    Cynthia. Zachery Dakins  . Appendectomy  1947  . Colonoscopy    . Mass excision  04/17/2012    Procedure: EXCISION MASS;  Surgeon: Clovis Pu. Cornett, MD;  Location: [facility];  Service: General;  Laterality: N/A;     Outpatient Encounter Prescriptions as of 01/23/2013  Medication Sig Dispense Refill  . aspirin 81 MG tablet Take 81 mg by mouth daily.      . Calcium Carbonate-Vitamin D (CALCIUM 600+D) 600-400 MG-UNIT per tablet Take 1 tablet by mouth daily.      . fish oil-omega-3 fatty acids 1000 MG capsule Take 1 g by mouth daily.      [name] glucosamine-chondroitin 500-400 MG tablet Take 1 tablet by mouth 3 (three) times daily.      . hydrochlorothiazide (HYDRODIURIL) 25 MG tablet Take 1 tablet (25 mg total) by mouth daily.  30 tablet  11  . metoprolol succinate (TOPROL XL) 25 MG 24 hr tablet Take 1 tablet (25 mg total) by mouth daily.  30 tablet  PRN  . Multiple Vitamins-Minerals (MULTIVITAMIN & MINERAL PO)  Take 1 tablet by mouth daily.      . simvastatin (ZOCOR) 20 MG tablet TAKE 1 TABLET (20 MG TOTAL) BY MOUTH AT BEDTIME.  90 tablet  2  . verapamil (CALAN-SR) 240 MG CR tablet TAKE 1 TABLET (240 MG TOTAL)  BY MOUTH AT BEDTIME.  90 tablet  2   No facility-administered encounter medications on file as of 01/23/2013.    Allergies  Allergen Reactions  . Epinephrine     REACTION: heart racing    Current Medications, Allergies, Past Medical History, Past Surgical History, Family History, and Social History were reviewed in [employer] record.    Review of Systems        See HPI - all other systems neg except as noted...  The patient complains of dyspnea on exertion.  The patient denies anorexia, fever, weight loss, weight gain, vision loss, decreased hearing, hoarseness, chest pain, syncope, peripheral edema, prolonged cough, headaches, hemoptysis, abdominal pain, melena, hematochezia, severe  indigestion/heartburn, hematuria, incontinence, muscle weakness, suspicious skin lesions, transient blindness, difficulty walking, depression, unusual weight change, abnormal bleeding, enlarged lymph nodes, and angioedema.   Objective:   Physical Exam    WD, WN, 79 y/o WF in NAD... VITAL SIGNS:  Reviewed... GENERAL:  Alert & oriented; pleasant & cooperative... HEENT:  [place]/AT, EOM-wnl, PERRLA, EACs-clear, TMs-wnl, NOSE-clear, THROAT-clear & wnl. NECK:  Supple w/ fair ROM; no JVD; normal carotid impulses w/o bruits; scar of prev thyroid surg- no nodules or goiter; no lymphadenopathy. CHEST:  Clear to P & A; without wheezes/ rales/ or rhonchi. HEART:  Regular Rhythm; without murmurs/ rubs/ or gallops. ABDOMEN:  Soft & nontender; normal bowel sounds; no organomegaly or masses detected. EXT: without deformities, mild arthritic changes; no varicose veins/ venous insuffic/ or edema. prob heel spurs w/ some plantar fascia pain> advised soaks & stretching... Mathis:  CN's intact;  no focal Mathis deficits... DERM:  Scar in right flank after lipoma excision... no other lesions noted...  MMSE 09/17/12>  She did not know the Pres ("I can see him, a black guy"), VP, or Gov;  Didn't know prev pres etc;  Recall 3/3 immed, but only 1/3 after distraction;  Serial 7s & WORLD backwards were fluent; proverbs were abstracted normally...   RADIOLOGY DATA:  Reviewed in the EPIC EMR & discussed w/ the patient...  LABORATORY DATA:  Reviewed in the EPIC EMR & discussed w/ the patient...   Assessment & Plan:    Macular Degen>  Followed by Cynthia Mathis/ [facility] & stable per pt hx...  HBP>  Now on Verap, MetopER, HCT- along w/ no salt; & BP appears to be well controlled; she is very active w/ "pickle ball" etc...  DYSLIPIDEMIA>  Stable on diet & Simva20...  Borderline DM>  Hx BS in the 120-130 range yrs ago but stable ~100 for the last 3+yrs on diet alone...  Hx Hypothyroidism>  She weaned off Synthroid several yrs  ago & TSH has remained normal, clinically euthyroid as well...  GI>  Divertics, Colon Polyps>  Stable & up to date w/ f/u colon 4/13 from Cynthia Mathis==> divertics, no polyps...  LIPOMA>  Removed 10/13 from RLQ area by Cynthia Mathis...  DJD>  She notes some right knee discomfort playing "pickle ball" & is advised to wear knee brace, take Aleve prn (she declines further eval or prescription meds)...  Osteoporosis>  Hx BMD in 2001 w/ TScore =3.2 in Spine; Rx'd Fosamax for 29yrs w/ steady improvement & on Bisphos drug holiday since 7/11 (plan is  to repeat BMD); rec to continue supplements w/ Calcium, MVI, Vit D...  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  MEMORY LOSS>  More noticeable memory decline; refer to [facility] Math[facility]...  Other medical problems as listed...   Patient's Medications  New Prescriptions   No medications on file  Previous Medications   ASPIRIN 81 MG TABLET    Take 81 mg by mouth daily.   CALCIUM CARBONATE-VITAMIN D (CALCIUM 600+D) 600-400 MG-UNIT PER TABLET    Take 1 tablet by mouth daily.   FISH OIL-OMEGA-3 FATTY ACIDS 1000 MG CAPSULE    Take 1 g by mouth daily.   GLUCOSAMINE-CHONDROITIN 500-400 MG TABLET    Take 1 tablet by mouth 3 (three) times daily.   MULTIPLE VITAMINS-MINERALS (MULTIVITAMIN & MINERAL PO)    Take 1 tablet by mouth daily.  Modified Medications   Modified Medication Previous Medication   HYDROCHLOROTHIAZIDE (HYDRODIURIL) 25 MG TABLET hydrochlorothiazide (HYDRODIURIL) 25 MG tablet      Take 1 tablet (25 mg total) by mouth daily.    Take 1 tablet (25 mg total) by mouth daily.   METOPROLOL SUCCINATE (TOPROL XL) 25 MG 24 HR TABLET metoprolol succinate (TOPROL XL) 25 MG 24 hr tablet      Take 1 tablet (25 mg total) by mouth daily.    Take 1 tablet (25 mg total) by mouth daily.   SIMVASTATIN (ZOCOR) 20 MG TABLET simvastatin (ZOCOR) 20 MG tablet      TAKE 1 TABLET (20  MG TOTAL) BY MOUTH AT BEDTIME.    TAKE 1 TABLET (20 MG TOTAL) BY MOUTH AT BEDTIME.   VERAPAMIL (CALAN-SR) 240 MG CR TABLET verapamil (CALAN-SR) 240 MG CR tablet      TAKE 1 TABLET (240 MG TOTAL)  BY MOUTH AT BEDTIME.    TAKE 1 TABLET (240 MG TOTAL)  BY MOUTH AT BEDTIME.  Discontinued Medications   No medications on file

## 2013-01-23 NOTE — Patient Instructions (Addendum)
Today we updated your med list in our EPIC system...    Continue your current medications the same...    We refilled your meds per request...  For your constipation>>    Try OTC MIRALAX- one capful of the powder mixed in water or juice daily...    If necessary you may also take SENAKOT-S one or two caps at bedtime...  Call for any questions...  Let's plan a follow up visit in 103mo w/ FASTING blood work at that time.Marland KitchenMarland Kitchen

## 2013-04-15 ENCOUNTER — Telehealth: Payer: Self-pay | Admitting: Pulmonary Disease

## 2013-04-15 NOTE — Telephone Encounter (Signed)
Per SN---   Ok to change the simvastatin to the atrovastatin 20 mg  1 daily.  thanks

## 2013-04-15 NOTE — Telephone Encounter (Signed)
ATC PT NA WCB 

## 2013-04-15 NOTE — Telephone Encounter (Signed)
I spoke with pt. She stated she read a review that simvastatin can cause memory loss. Pt reports she has been on this medication for years. She has noticed she has had some memory loss the past couple of weeks. She is wanting to know if SN can change her to something different. Please advise Dr. Kriste Basque thanks Last OV 01/23/13 Pending 08/04/13

## 2013-04-16 MED ORDER — ATORVASTATIN CALCIUM 20 MG PO TABS: 20.0000 mg | ORAL_TABLET | Freq: Every day | ORAL | Status: DC

## 2013-04-16 NOTE — Telephone Encounter (Signed)
lmomtcb x1 

## 2013-04-16 NOTE — Telephone Encounter (Signed)
I spoke with Cynthia Mathis. She is aware of the changed and rx sent to Cynthia Mathis [facility] [address] per her request. Nothing further needed

## 2013-04-23 ENCOUNTER — Ambulatory Visit (INDEPENDENT_AMBULATORY_CARE_PROVIDER_SITE_OTHER): Payer: Medicare Other | Admitting: Diagnostic Neuroimaging

## 2013-04-23 ENCOUNTER — Encounter: Payer: Self-pay | Admitting: Diagnostic Neuroimaging

## 2013-04-23 VITALS — BP 148/68 | HR 55 | Temp 97.9°F | Ht 62.0 in | Wt 140.0 lb

## 2013-04-23 DIAGNOSIS — R413 Other amnesia: Secondary | ICD-10-CM

## 2013-04-23 NOTE — Patient Instructions (Signed)
Call us in 6 months to update your progress. We may setup neuropsych testing if your memory continues to worsen.

## 2013-04-23 NOTE — Progress Notes (Signed)
[facility]  PATIENT: Cynthia Mathis DOB: 08/27/1932  REFERRING CLINICIAN: Nadel HISTORY FROM: patient and husband REASON FOR VISIT: new consult   HISTORICAL  CHIEF COMPLAINT:  Chief Complaint  Patient presents with  . Follow-up    HISTORY OF PRESENT ILLNESS:   UPDATE 04/23/13: Since last visit patient continues to have short-term memory problems. This is when she has conversations with her husband and then cannot remember the details later. She also has some difficulty remembering peoples names after meeting them. Other people have not noticed any significant memory problems. Patient notices these problems primarily herself. Patient denies any significant depression. He does have some anxiety feelings with tense turning feeling in her stomach especially if her husband gets upset with her.  PRIOR HPI (10/07/12): 79 year old right-handed female here for evaluation of memory loss. Patient accompanied by her husband.  For past one year patient is having increasing trouble with forgetting things. She's having to rely on taking notes, maintaining a calendar, using lists to remember things. Patient accompanied by her husband, who also has been diagnosed with mild cognitive impairment versus mild dementia by Dr. Sandria Manly. He takes galantamine, which he says has helped his memory.  Other examples include forgetting recent conversations, recent events, repeating questions over and over. Unfortunately the only informant of this problem is the patient's husband, who also happens to have mild cognitive impairment or dementia. No other family members or friends have noticed this problem in the patient. Otherwise patient is doing well still able to maintain activities of daily living including cooking, cleaning, driving, shopping.  REVIEW OF SYSTEMS: Full 14 system review of systems performed and notable only for hearing loss, memory loss, anxiety. Otherwise  negative.  ALLERGIES: Allergies  Allergen Reactions  . Epinephrine     REACTION: heart racing    HOME MEDICATIONS: Outpatient Prescriptions Prior to Visit  Medication Sig Dispense Refill  . aspirin 81 MG tablet Take 81 mg by mouth daily.      . Calcium Carbonate-Vitamin D (CALCIUM 600+D) 600-400 MG-UNIT per tablet Take 1 tablet by mouth daily.      . fish oil-omega-3 fatty acids 1000 MG capsule Take 1 g by mouth daily.      [name] glucosamine-chondroitin 500-400 MG tablet Take 1 tablet by mouth 3 (three) times daily.      . hydrochlorothiazide (HYDRODIURIL) 25 MG tablet Take 1 tablet (25 mg total) by mouth daily.  90 tablet  3  . metoprolol succinate (TOPROL XL) 25 MG 24 hr tablet Take 1 tablet (25 mg total) by mouth daily.  90 tablet  3  . Multiple Vitamins-Minerals (MULTIVITAMIN & MINERAL PO) Take 1 tablet by mouth daily.      . simvastatin (ZOCOR) 20 MG tablet TAKE 1 TABLET (20 MG TOTAL) BY MOUTH AT BEDTIME.  90 tablet  3  . verapamil (CALAN-SR) 240 MG CR tablet TAKE 1 TABLET (240 MG TOTAL)  BY MOUTH AT BEDTIME.  90 tablet  3  . atorvastatin (LIPITOR) 20 MG tablet Take 1 tablet (20 mg total) by mouth daily.  30 tablet  6   No facility-administered medications prior to visit.    PAST MEDICAL HISTORY: Past Medical History  Diagnosis Date  . Macular degeneration   . Hypertension   . Dyslipidemia   . Borderline diabetes mellitus   . Hypothyroid   . Diverticulosis of colon   . Hx of colonic polyps   . History of hematuria   . DJD (degenerative joint disease)   .  History of osteoporosis   . Memory loss   . Anxiety   . Lipoma   . Hyperlipidemia     PAST SURGICAL HISTORY: Past Surgical History  Procedure Laterality Date  . Thyroidectomy  1990    Dr. Zachery Dakins  . Appendectomy  1947  . Colonoscopy    . Mass excision  04/17/2012    Procedure: EXCISION MASS;  Surgeon: Clovis Pu. Cornett, MD;  Location: [facility];  Service: General;  Laterality: N/A;    FAMILY  HISTORY: Family History  Problem Relation Age of Onset  . Colon cancer Neg Hx   . Stomach cancer Neg Hx   . Stroke Mother     SOCIAL HISTORY:  History   Social History  . Marital Status: Married    Spouse Name: Rande Lawman Hoban    Number of Children: 3  . Years of Education: [employer]   Occupational History  . seamstress    Social History Main Topics  . Smoking status: Never Smoker   . Smokeless tobacco: Never Used  . Alcohol Use: Yes     Comment: occasionally  . Drug Use: No  . Sexual Activity: Not on file   Other Topics Concern  . Not on file   Social History Narrative   Pt lives at home with her spouse Rande Lawman Deskin, who has been dx'd with mild cognitive impairment vs. mild dementia, former pt of Dr. Sandria Manly). She does not use caffeine, if so, rarely.     PHYSICAL EXAM  Filed Vitals:   04/23/13 1329  BP: 148/68  Pulse: 55  Temp: 97.9 F (36.6 C)  TempSrc: Oral  Height: 5\' 2"  (1.575 m)  Weight: 140 lb (63.504 kg)   Body mass index is 25.6 kg/(m^2).  GENERAL EXAM: Patient is in no distress  CARDIOVASCULAR: Regular rate and rhythm, no murmurs, no carotid bruits  NEUROLOGIC: MENTAL STATUS: awake, alert, language fluent, comprehension intact, naming intact; MMSE 28/30. MOCA 23/30. AFT 9. GDS 1. CRANIAL NERVE: no papilledema on fundoscopic exam, pupils equal and reactive to light, visual fields full to confrontation, extraocular muscles intact, no nystagmus, facial sensation and strength symmetric, uvula midline, shoulder shrug symmetric, tongue midline. MOTOR: normal bulk and tone, full strength in the BUE, BLE SENSORY: normal and symmetric to light touch, pinprick, temperature, vibration. COORDINATION: finger-nose-finger, fine finger movements normal REFLEXES: deep tendon reflexes present and symmetric GAIT/STATION: narrow based gait; able to walk on toes, heels and tandem; romberg is negative   DIAGNOSTIC DATA (LABS, IMAGING, TESTING) - I reviewed  patient records, labs, notes, testing and imaging myself where available.  Lab Results  Component Value Date   WBC 10.8* 05/15/2012   HGB 14.5 05/15/2012   HCT 44.6 05/15/2012   MCV 91.8 05/15/2012   PLT 232.0 05/15/2012      Component Value Date/Time   NA 141 09/17/2012 1629   K 3.7 09/17/2012 1629   CL 103 09/17/2012 1629   CO2 33* 09/17/2012 1629   GLUCOSE 106* 09/17/2012 1629   BUN 23 09/17/2012 1629   CREATININE 0.9 09/17/2012 1629   CALCIUM 9.4 09/17/2012 1629   PROT 6.9 04/15/2012 1500   ALBUMIN 3.8 04/15/2012 1500   AST 20 04/15/2012 1500   ALT 14 04/15/2012 1500   ALKPHOS 77 04/15/2012 1500   BILITOT 0.4 04/15/2012 1500   GFRNONAA 67* 04/15/2012 1500   GFRAA 78* 04/15/2012 1500   Lab Results  Component Value Date   CHOL 169 07/31/2011   HDL 56.10 07/31/2011  LDLCALC 89 07/31/2011   TRIG 118.0 07/31/2011   CHOLHDL 3 07/31/2011   Lab Results  Component Value Date   HGBA1C 6.2 09/17/2012   Lab Results  Component Value Date   VITAMINB12 819 10/07/2012   Lab Results  Component Value Date   TSH 0.37 09/17/2012    10/16/12 MRI brain - mild atrophy and mild chronic small vessel ischemic disease.    ASSESSMENT AND PLAN  79 y.o. year old female  has a past medical history of Macular degeneration; Hypertension; Dyslipidemia; Borderline diabetes mellitus; Hypothyroid; Diverticulosis of colon; colonic polyps; History of hematuria; DJD (degenerative joint disease); History of osteoporosis; Memory loss; Anxiety; Lipoma; and Hyperlipidemia. here with history of short-term memory problems since March 2012. Neurologic examination today is unremarkable. MMSE 29/30 --> 28/30. MOCA 21/30 --> 23/30. Interestingly, MOCA scores have actually improved since last visit.  Ddx: mild cognitive impairment vs normal aging   PLAN: 1. Discussed cholinergic medications and namenda, but I do not recommend starting at this  2. Monitor symptoms, if memory continues to decline at home, will check neuropsych  testing   No orders of the defined types were placed in this encounter.    Suanne Marker, MD 04/23/2013, 2:18 PM Certified in Neurology, Neurophysiology and Neuroimaging  [facility] [address], [address] [place], [place] [address] [phone]

## 2013-04-25 NOTE — Telephone Encounter (Signed)
This encounter was created in error - please disregard.

## 2013-05-15 ENCOUNTER — Other Ambulatory Visit: Payer: Self-pay

## 2013-08-04 ENCOUNTER — Ambulatory Visit (INDEPENDENT_AMBULATORY_CARE_PROVIDER_SITE_OTHER)
Admission: RE | Admit: 2013-08-04 | Discharge: 2013-08-04 | Disposition: A | Discharge status: Home / Self Care | Payer: Medicare Other | Source: Ambulatory Visit | Attending: Pulmonary Disease | Admitting: Pulmonary Disease

## 2013-08-04 ENCOUNTER — Other Ambulatory Visit (INDEPENDENT_AMBULATORY_CARE_PROVIDER_SITE_OTHER): Payer: Medicare Other

## 2013-08-04 ENCOUNTER — Encounter: Payer: Self-pay | Admitting: Pulmonary Disease

## 2013-08-04 ENCOUNTER — Ambulatory Visit (INDEPENDENT_AMBULATORY_CARE_PROVIDER_SITE_OTHER): Payer: Medicare Other | Admitting: Pulmonary Disease

## 2013-08-04 VITALS — BP 140/82 | HR 48 | Temp 99.3°F | Ht 62.0 in | Wt 145.4 lb

## 2013-08-04 DIAGNOSIS — E039 Hypothyroidism, unspecified: Secondary | ICD-10-CM

## 2013-08-04 DIAGNOSIS — I10 Essential (primary) hypertension: Secondary | ICD-10-CM

## 2013-08-04 DIAGNOSIS — Z148 Genetic carrier of other disease: Secondary | ICD-10-CM

## 2013-08-04 DIAGNOSIS — K5730 Diverticulosis of large intestine without perforation or abscess without bleeding: Secondary | ICD-10-CM

## 2013-08-04 DIAGNOSIS — M8580 Other specified disorders of bone density and structure, unspecified site: Secondary | ICD-10-CM | POA: Insufficient documentation

## 2013-08-04 DIAGNOSIS — E785 Hyperlipidemia, unspecified: Secondary | ICD-10-CM

## 2013-08-04 DIAGNOSIS — M1990 Unspecified osteoarthritis, unspecified site: Secondary | ICD-10-CM

## 2013-08-04 DIAGNOSIS — D126 Benign neoplasm of colon, unspecified: Secondary | ICD-10-CM

## 2013-08-04 DIAGNOSIS — R413 Other amnesia: Secondary | ICD-10-CM

## 2013-08-04 DIAGNOSIS — R7309 Other abnormal glucose: Secondary | ICD-10-CM

## 2013-08-04 DIAGNOSIS — Z23 Encounter for immunization: Secondary | ICD-10-CM

## 2013-08-04 LAB — HEPATIC FUNCTION PANEL
ALK PHOS: 55 U/L (ref 39–117)
ALT: 19 U/L (ref 0–35)
AST: 21 U/L (ref 0–37)
Albumin: 4 g/dL (ref 3.5–5.2)
BILIRUBIN TOTAL: 0.9 mg/dL (ref 0.3–1.2)
Bilirubin, Direct: 0.1 mg/dL (ref 0.0–0.3)
Total Protein: 7.3 g/dL (ref 6.0–8.3)

## 2013-08-04 LAB — CBC WITH DIFFERENTIAL/PLATELET
BASOS ABS: 0.1 10*3/uL (ref 0.0–0.1)
BASOS PCT: 0.8 % (ref 0.0–3.0)
EOS ABS: 0.2 10*3/uL (ref 0.0–0.7)
Eosinophils Relative: 1.7 % (ref 0.0–5.0)
HEMATOCRIT: 43.7 % (ref 36.0–46.0)
HEMOGLOBIN: 14.9 g/dL (ref 12.0–15.0)
LYMPHS ABS: 3.3 10*3/uL (ref 0.7–4.0)
Lymphocytes Relative: 32.7 % (ref 12.0–46.0)
MCHC: 34.1 g/dL (ref 30.0–36.0)
MCV: 90.2 fl (ref 78.0–100.0)
Monocytes Absolute: 0.7 10*3/uL (ref 0.1–1.0)
Monocytes Relative: 7.1 % (ref 3.0–12.0)
Neutro Abs: 5.9 10*3/uL (ref 1.4–7.7)
Neutrophils Relative %: 57.7 % (ref 43.0–77.0)
Platelets: 227 10*3/uL (ref 150.0–400.0)
RBC: 4.85 Mil/uL (ref 3.87–5.11)
RDW: 13.7 % (ref 11.5–14.6)
WBC: 10.2 10*3/uL (ref 4.5–10.5)

## 2013-08-04 LAB — LIPID PANEL
Cholesterol: 155 mg/dL (ref 0–200)
HDL: 50 mg/dL (ref >39.00)
LDL CALC: 81 mg/dL (ref 0–99)
Total CHOL/HDL Ratio: 3
Triglycerides: 122 mg/dL (ref 0.0–149.0)
VLDL: 24.4 mg/dL (ref 0.0–40.0)

## 2013-08-04 LAB — BASIC METABOLIC PANEL
BUN: 15 mg/dL (ref 6–23)
CO2: 30 mEq/L (ref 19–32)
Calcium: 9.2 mg/dL (ref 8.4–10.5)
Chloride: 102 mEq/L (ref 96–112)
Creatinine, Ser: 0.8 mg/dL (ref 0.4–1.2)
GFR: 73.58 mL/min (ref >60.00)
Glucose, Bld: 103 mg/dL — ABNORMAL HIGH (ref 70–99)
Potassium: 3.9 mEq/L (ref 3.5–5.1)
SODIUM: 141 meq/L (ref 135–145)

## 2013-08-04 LAB — TSH: TSH: 0.59 u[IU]/mL (ref 0.35–5.50)

## 2013-08-04 MED ORDER — DONEPEZIL HCL 10 MG PO TABS: ORAL_TABLET | ORAL | Status: DC

## 2013-08-04 NOTE — Progress Notes (Signed)
Subjective:    Patient ID: Cynthia Mathis, female    DOB: 06-26-1933, 80 y.o.   MRN: [id number]  HPI 80 y/o WF here for a follow up visit... she has mult med problems as noted below ...   ~  sees DrPerry for GI- divertics, polyps, hems & a bout of diverticulitis in Mar08...  ~  sees Dr Risa Grill Urology w/ microscopic hematuria and a neg eval 5/08... 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 ~  sees DrLLomax for Derm w/ alopecia- offered Rogaine but decided against it...  ~  May 15, 2012:  3-59mo ROV & [name] has had a prob w/ her BP> when I last saw her she was on Verap240 + Lisinopril30 & BP was ~150/80 but she had a throat clearing tic & mild cough so we switched the ACE to Losartan100; side effect improved somewhat but BP was higher on her home checks & she wanted to go back on the Lisin; she saw TP & her labs showed K=6.0 so the ACE/ ARB was stopped in favor of HCTZ25mg /d; f/u labs showed K back to 5 but home BP checks were up somewhat, therefore ToprolXL25 added;  BP generally better on the combo Verap240, HCT25, MetopER25 & measured 140/80 at day-surg & at DrCornett's office recently & measures 126/72 here today (she says home readings are higher & she will bring cuff to f/u OVs)... She does not HR is slower & measures 50 at rest here today but she is asymptomatic- good energy, not dizzy, no CP/ palpit/ weakness/ SOB/ etc; she plays "pickle ball" regularly & some matches last 3H & she has no difficulty...  In the final analysis she decided to continue the same meds rather than change them further...    She had her RLQ Lipoma removed surgically as outpt by DrCornett 04/17/12, she is glad to be rid of it...    She also tells me that her daugh in [place] was Dx w/ Hemochromatosis; they didn't have any details but she apparently undergoing phlebotomy now; I explained that both she & her husb would have to be carriers of the gene & we will check her Fe level & Hemochromatosis  DNA test...   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  ~  September 17, 2012:  87mo ROV & Cynthia Mathis is concerned about her memory; husb has been under the care of DrLove for >19yrs w/ mild cognitive impairment, her MMSE is abn & we will refer to [facility] for further assessment & scans... We reviewed the following medical problems during today's office visit >>     Macular Degen> followed by DrHecker & DrRankin...    HBP> on ASA81, Metop25, Verap240, HCT25; BP= 140/80 & she denies HA, CP, palpit, dizzy, SOB, edema, etc...    Hyperlipid> on Simva20; last FLP 1/13 showed TChol 169, TG 118, HDL 56, LDL 89; she is not fasting today...    Hx Impaired FBS> Hx BS in the 130 range, but all readings ~100 x yrs now;  Labs 3/14 showed BS=106, A1c=6.2.Marland KitchenMarland Kitchen    Hypothy> hx multinod thyroid x yrs; had thyroidectomy 1990 by DrWeatherly; she weaned off Thyroid hormone yrs ago & has remained clinically & biochem euthyroid; TSH= 0.37    GI- Divertics, Polyps> hx diverticulitis in 2008- followed by DrPerry; last colonoscopy 4/13 showed severe diverticulosis w/o inflamm or polyps seen...    FamHx Hemochromatosis> she is heterozygous for  the C282Y mutation; daugh in [place] was Dx w/ hemochromatosis on phlebotomy rx...    DJD, Osteoporosis> on Ca, MVI, Glucosamine; she uses OTC analgesic meds as needed; see BMDs below- on Bisphos drug holiday since 7/11 & due for f/u BMD...    Memory Loss> she is c/o difficulty w/ name, dates, etc; MMSE is abnormal & we will refer to [facility]...    Anxiety> no requiring meds... We reviewed prob list, meds, xrays and labs> see below for updates >>   LABS 3/14:  Chems- wnl w/ BS=106, A1c=6.2;  TSH=0.37;  Sed=14...    ~  January 23, 2013:  76mo ROV & Cynthia Mathis notes that her memory is slipping some & her hearing is not as good;  She had f/u visit w/ DrPenumalli 3/14> her MMSE was 29/30 & he planned to check MRI Brain  & B12 level, he did not rec starting meds yet (husb takes Razadyne & feels it helps him); he does rec fresh fruits & vegetables + physical exercise...    BP is controlled on ToprolXL25, CalanSR240, & HCT25; BP= 142/78 & she denies HA, CP, palpit, SOB, edema...    Chol is controlled on Simva20 + Fish Oil; Last FLP was 1/13 as she cannot seem to remember to come fasting for blood work (see below)...    She remains on a bisphos drug holiday & needs f/u BMD soon to re-assess...  We reviewed prob list, meds, xrays and labs> see below for updates >>  ADDENDUM 04/15/13 >> pt called & wants off Simva20 after reading something about memory loss=> change to Atorva20 (she never did)...  ~  August 04, 2013:  40mo ROV & Cynthia Mathis states she's doing well- no new complaints or concerns...    Macular Degen> followed by DrHecker & DrRankin...    HBP> on ASA81, Metop25, Verap240, HCT25; BP= 140/82 & she denies HA, CP, palpit, dizzy, SOB, edema, etc; she is bradycardic & we decided to stop ToprolXL & replace it w/ [id number]...    Hyperlipid> on Simva20; last FLP 1/15 showed TChol 155, TG 122, HDL 50, LDL 81; she is not fasting today...    Hx Impaired FBS> Hx BS in the 130 range, but all readings ~100 x yrs now;  Labs 1/15 showed BS=103    Hypothy> hx multinod thyroid x yrs; had thyroidectomy 1990 by DrWeatherly; she weaned off Thyroid hormone yrs ago & has remained clinically & biochem euthyroid; TSH= 0.59    GI- Divertics, Polyps> hx diverticulitis in 2008- followed by DrPerry; last colonoscopy 4/13 showed severe diverticulosis w/o inflamm or polyps seen...    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 Hg= 14.9    DJD, Osteoporosis> on Ca, MVI, Glucosamine; she uses OTC analgesic meds as needed; see BMDs below- on Bisphos drug holiday since 7/11 & due for f/u BMD...    Memory Loss> she is c/o difficulty w/ name, dates, etc; MMSE is  abnormal & eval by [facility] 4/14, DrPenumalli; felt to have mild cognitive impairment vs normal aging, did not rec starting meds... Exam today w/ 0/3 recall after distraction, start Aricept trial.    Anxiety> no requiring meds... We reviewed prob list, meds, xrays and labs> see below for updates >> given TDAP today...  CXR 1/15 showed heart at upper lim of norm, lungs clear (mild dev trachea to right c/w enlarged left lobe of thyroid- no change)...  EKG 1/15 showed [place], rate44,  NSSTTW changes...   LABS 1/15:  FLP- at goals on Simva20;  Chems- wnl;  CBC- wnl;  TSH=0.59;  VitD=54... NOTE:  She wants to try ARICEPT10 start 1/2 => 1 tab daily as trial; Pulse is low at 48 & we decided to stop ToprolXL & replace it w/ VALSARTAN160/d...           Problem List:  MACULAR DEGENERATION (ICD-362.50) - eval by DrHecker w/ foveal telangiectasia, macular drusen, macular degeneration, and cataracts- being followed regularly... vision is preserved so far... ~  11/11:  f/u DrHecker- stable age related mac degen, no retinopathy. ~  11/12:  f/u DrHecker- has advanced bilat AMD, sent to [facility], we don't have his notes...  HYPERTENSION (ICD-401.9) - on VERAPAMIL 240mg /d,  TOPROL-XL 25mg /d,  HCTZ 25mg /d... Prev on Lisinopril but this was stopped due to throat clearing tic & mild cough, plus K=6.0 at the time (all improved off the ACE)... ~  8/10: borderline BP here and she will monitor at home carefully & call if not <150/90. ~  7/11: she reports that she incr the Lisinopril to 1&1/2 tabs due to BP~150+, now better... ~  7/12:  BP stable on Verap240 & Lisinopril 30mg /d> BP= 140/68 today> denies HA, fatigue, visual changes, CP, palipit, dizziness, syncope, dyspnea, edema, etc... ~  1/13:  BP= 160/90 (didn't take med today); states BP are ok at home; continue same meds + low sodium etc... ~  CXR 1/13 showed norm heart size, clear lungs, mild thor spondylosis, NAD.[name]. ~  7/13:  BP= 152/82 & she notes some  troublesome throat clearing & mild cough; we decided to STOP the ACE & switch to LOSARTAN 100mg /d, monitor her symptoms and her blood presure... ~  CXR 9/13 showed calcif Ao, clear lungs, suspect left thyroid enlargement, DJD in Tspine... ~  EKG 10/13 showed SBrady, rate58, wnl, NAD... ~  11/13: she had long convoluted hx of BP med changes- see above- now on Verap240, MetopER25, HCT25, & BP= 126/72; we discussed options but she is content to continue these same meds for now & watch BP... ~  3/14: on ASA81, Metop25, Verap240, HCT25; BP= 140/80 & she denies HA, CP, palpit, dizzy, SOB, edema, etc. ~  1/15: on ASA81, Metop25, Verap240, HCT25; BP= 140/82 & she remains essen asymptomatic; she is bradycardic & we decided to stop ToprolXL & replace it w/ [id number]  DYSLIPIDEMIA (ICD-272.4) - on ZOCOR 20mg /d... plus doing well on diet... ~  [place] 6/09 showed TChol 144, TG 140, HDL 39, LDL 77... continue same. ~  FLP 2/10 showed TChol 164, TG 77, HDL 54, LDL 95 ~  FLP 8/10 showed TChol 138, TG 51, HDL 48, LDL 80 ~  FLP 1/11 showed TChol 155, TG 100, HDL 48, LDL 87 ~  FLP 7/11 showed TChol 158, TG 74, HDL 55, LDL 89 ~  FLP 1/12 showed TChol 155, TG 101, HDL 50, LDL 85 ~  FLP 1/13 on Simva20 showed TChol 169, TG 118, HDL 56, LDL 89  ~  FLP 1/15 on Simva20 showed TChol 155, TG 122, HDL 50, LDL 81  Hx Impaired Fasting Glucose >> on diet alone... FBS @ home betw 120-130 recently... she is on a good diet and weight is down at 139# today (63"  BMI= 24)... ~  labs 3/09 showed FBS= 119... subseq HgA1c 5/09 was 6.4... diet Rx. ~  labs 8/09 showed BS= 106, HgA1c= 5.9.Marland KitchenMarland Kitchen rec- same. ~  labs 2/10 showed BS= 102, A1c= 5.9 ~  labs 1/11 showed  BS= 98 ~  labs 1/12 showed BS= 95 ~  Labs 1/13 showed BS= 101 ~  Labs 3/14 showed BS=106, A1c=6.2 ~  Labs 1/15 showed BS= 103  HYPOTHYROIDISM (ICD-244.9) - off Synthroid now...  clinically & biochemically euthyroid... remote hx of overactive thyroid many yrs ago in [place] (we don't  have records)... she had an eval by DrKohut in 1987 w/ familial multinodular goiter w/ neg FNA... started on Rx but goiter enlarged- had thyroidectomy 1990 by DrWeatherly... ~  labs 3/09 showed TSH = 0.35 (0.35-5.5)... we have slowly decr her dose to 65mcg/d... ~  labs 8/09 showed TSH= 0.33... rec- decr the Synth50 to 1/2 tab daily. ~  labs 2/10 showed =TSH= 0.54 and Synthroid stopped... ~  labs 4/10 off med showed TSH= 0.59 ~  labs 8/10 showed TSH= 0.60 ~  labs 1/11 showed TSH= 0.60 ~  labs 7/11 showed TSH= 0.56 ~  labs 1/12 showed TSH= 0.60 ~  Labs 1/13 showed TSH= 0.58 ~  Labs 3/14 showed TSH= 0.37 ~  Labs 1/15 showed TSH= 0.59  DIVERTICULOSIS OF COLON (ICD-562.10) COLONIC POLYPS (ICD-211.3) - followed by DrPerry... bout of diverticulitis in Mar08... ~  last colonoscopy 3/08 showed divertics, 18mm polyp, hems... path=hyperplastic, f/u planned 37yrs. ~  F/u colonoscopy 4/13 by DrPerry showed severe diverticulosis w/o inflamm and w/o polyps etc...  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  HEMATURIA, HX OF (ICD-V13.09) - eval 4/08 by DrGrapey w/ neg Sonar, Cysto, cytology...  GYN >> DrKRichardson, Mammograms at [facility]...  DEGENERATIVE JOINT DISEASE (ICD-715.90) - c/o discomfort in her shoulders and knees... uses OTC meds as needed.  Hx of OSTEOPOROSIS (ICD-733.00) - on Fosamax 70/wk thru 7/11, Caltrate 1/d, MVI, Vit D 1000/d... ~  initial BMD 10/01 showed TScores -0.2  to -3.2 in the spine...  ~  f/u studies w/ steady improvement on Fosamax therapy... ~  BMD 3/09 w/ TScores -0.1 to -1.7 in the spine... continue Rx. ~  BMD 7/11 showed TScores -2.0 Spine, and -0.3 in right FemNeck... we opted for Bisphos drug holiday to start now. ~  Labs 1/13 showed Vit D level = 48, continue  same...  ? of MEMORY LOSS (ICD-780.93) - MMSE 5/09 by Doran Heater, NP was 29/30... Reassured. ~  3/14: she presented w/ c/o worsening memory & MMSE has diminished- refer to [facility] for eval & rx... ~  Neuro eval 4/14 by DrPenumalli> mild cognitive impairment vs norm aging, he did not rec starting meds... ~  CT Brain 4/14 showed mild atrophy & sm vessel dis ~  She continues to f/u w/ Neurology periodically... ~  1/15: ROV here & husb more concerned re her memory, recall was 0/3 after distraction; husb has apparently done well on aricept 7 she wants trial, start ARICEPT5=>10 daily...  ANXIETY (ICD-300.00) - prev on Alpraz Prn but hasn't used in yrs...  DERMATOLOGY >> LIPOMA (ICD-214.9) - notes large lipoma in RLQ/ rt flank area... some discomfort noted... seen by DrLeone in the past w/o surg... she saw DrCornett, CCS, 1/11 for second opinion & she will decide re: excision... ~  5/13:  She had Basal cell ca removed from the left side of her nose, w/ subseq deeper margins & reports everything is OK... ~  7/13:  She is concerned that the lipoma may be growing & she  requests f/u DrCornett for excision... ~  10/13:  Lipoma removed by DrCornett in outpt surg 04/17/12...  SHINGLES - remote hx of right T8-9 shingles in 1986...   Past Surgical History  Procedure Laterality Date  . Thyroidectomy  1990    [name]  . Appendectomy  1947  . Colonoscopy    . Mass excision  04/17/2012    Procedure: EXCISION MASS;  Surgeon: [name], MD;  Location: [place];  Service: General;  Laterality: N/A;    Outpatient Encounter Prescriptions as of 08/04/2013  Medication Sig  . aspirin 81 MG tablet Take 81 mg by mouth daily.  . Calcium Carbonate-Vitamin D (CALCIUM 600+D) 600-400 MG-UNIT per tablet Take 1 tablet by mouth daily.  . fish oil-omega-3 fatty acids 1000 MG capsule Take 1 g by mouth daily.  [name] glucosamine-chondroitin 500-400 MG tablet Take 1 tablet by mouth 3 (three)  times daily.  . hydrochlorothiazide (HYDRODIURIL) 25 MG tablet Take 1 tablet (25 mg total) by mouth daily.  . metoprolol succinate (TOPROL XL) 25 MG 24 hr tablet Take 1 tablet (25 mg total) by mouth daily.  . Multiple Vitamins-Minerals (MULTIVITAMIN & MINERAL PO) Take 1 tablet by mouth daily.  . simvastatin (ZOCOR) 20 MG tablet TAKE 1 TABLET (20 MG TOTAL) BY MOUTH AT BEDTIME.  . verapamil (CALAN-SR) 240 MG CR tablet TAKE 1 TABLET (240 MG TOTAL)  BY MOUTH AT BEDTIME.    Allergies  Allergen Reactions  . Epinephrine     REACTION: heart racing    Current Medications, Allergies, Past Medical History, Past Surgical History, Family History, and Social History were reviewed in [employer] record.    Review of Systems        See HPI - all other systems neg except as noted...  The patient complains of dyspnea on exertion.  The patient denies anorexia, fever, weight loss, weight gain, vision loss, decreased hearing, hoarseness, chest pain, syncope, peripheral edema, prolonged cough, headaches, hemoptysis, abdominal pain, melena, hematochezia, severe indigestion/heartburn, hematuria, incontinence, muscle weakness, suspicious skin lesions, transient blindness, difficulty walking, depression, unusual weight change, abnormal bleeding, enlarged lymph nodes, and angioedema.   Objective:   Physical Exam    WD, WN, 80 y/o WF in NAD... VITAL SIGNS:  Reviewed... GENERAL:  Alert & oriented; pleasant & cooperative... HEENT:  [place]/AT, EOM-wnl, PERRLA, EACs-clear, TMs-wnl, NOSE-clear, THROAT-clear & wnl. NECK:  Supple w/ fair ROM; no JVD; normal carotid impulses w/o bruits; scar of prev thyroid surg- no nodules or goiter; no lymphadenopathy. CHEST:  Clear to P & A; without wheezes/ rales/ or rhonchi. HEART:  Regular Rhythm; without murmurs/ rubs/ or gallops. ABDOMEN:  Soft & nontender; normal bowel sounds; no organomegaly or masses detected. EXT: without deformities, mild arthritic  changes; no varicose veins/ venous insuffic/ or edema. prob heel spurs w/ some plantar fascia pain> advised soaks & stretching... NEURO:  CN's intact;  no focal neuro deficits... DERM:  Scar in right flank after lipoma excision... no other lesions noted...  MMSE 09/17/12>  She did not know the Pres ("I can see him, a black guy"), VP, or Gov;  Didn't know prev pres etc;  Recall 3/3 immed, but only 1/3 after distraction;  Serial 7s & WORLD backwards were fluent; proverbs were abstracted normally...   RADIOLOGY DATA:  Reviewed in the EPIC EMR & discussed w/ the patient...  LABORATORY DATA:  Reviewed in the EPIC EMR & discussed w/ the patient...   Assessment & Plan:  Macular Degen>  Followed by DrHecker/ Rankin & stable per pt hx...  HBP>  Now on Verap, MetopER, HCT- along w/ no salt; & BP appears to be well controlled; but bradycardic 7 we decided to change the Metop to DIOVAN160/d...  DYSLIPIDEMIA>  Stable on diet & Simva20...  Borderline DM>  Hx BS in the 120-130 range yrs ago but stable ~100 for the last 3+yrs on diet alone...  Hx Hypothyroidism>  She weaned off Synthroid several yrs ago & TSH has remained normal, clinically euthyroid as well...  GI>  Divertics, Colon Polyps>  Stable & up to date w/ f/u colon 4/13 from DrPerry==> divertics, no polyps...  LIPOMA>  Removed 10/13 from RLQ area by DrCornett...  DJD>  She notes some right knee discomfort playing "pickle ball" & is advised to wear knee brace, take Aleve prn (she declines further eval or prescription meds)...  Osteoporosis>  Hx BMD in 2001 w/ TScore =3.2 in Spine; Rx'd Fosamax for 23yrs w/ steady improvement & on Bisphos drug holiday since 7/11 (plan is to repeat BMD); rec to continue supplements w/ Calcium, MVI, Vit D...  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  MEMORY LOSS>  More noticeable memory decline; they request  trial of meds=> start Aricept5 => 10 daily...  Other medical problems as listed...   Patient's Medications  New Prescriptions   DICYCLOMINE (BENTYL) 10 MG CAPSULE    Take 1 capsule (10 mg total) by mouth 3 (three) times daily as needed (for abdominal pain).   DONEPEZIL (ARICEPT) 10 MG TABLET    Take 1/2 tablet by mouth daily x 1 month, then 1 daily thereafter.   VALSARTAN (DIOVAN) 160 MG TABLET    Take 1 tablet (160 mg total) by mouth daily.  Previous Medications   ASPIRIN 81 MG TABLET    Take 81 mg by mouth daily.   CALCIUM CARBONATE-VITAMIN D (CALCIUM 600+D) 600-400 MG-UNIT PER TABLET    Take 1 tablet by mouth daily.   FISH OIL-OMEGA-3 FATTY ACIDS 1000 MG CAPSULE    Take 1 g by mouth daily.   GLUCOSAMINE-CHONDROITIN 500-400 MG TABLET    Take 1 tablet by mouth 3 (three) times daily.   HYDROCHLOROTHIAZIDE (HYDRODIURIL) 25 MG TABLET    Take 1 tablet (25 mg total) by mouth daily.   MULTIPLE VITAMINS-MINERALS (MULTIVITAMIN & MINERAL PO)    Take 1 tablet by mouth daily.   SIMVASTATIN (ZOCOR) 20 MG TABLET    TAKE 1 TABLET (20 MG TOTAL) BY MOUTH AT BEDTIME.   VERAPAMIL (CALAN-SR) 240 MG CR TABLET    TAKE 1 TABLET (240 MG TOTAL)  BY MOUTH AT BEDTIME.  Modified Medications   No medications on file  Discontinued Medications   METOPROLOL SUCCINATE (TOPROL XL) 25 MG 24 HR TABLET    Take 1 tablet (25 mg total) by mouth daily.

## 2013-08-04 NOTE — Patient Instructions (Signed)
Today we updated your med list in our EPIC system...    Continue your current medications the same...  We decided to add ARICEPT (Donepizil) for your memory>>    Start w/ 1/2 tab daily for 1 month, then go up to 1 tab daily thereafter...  Today we did your follow up CXR, EKG, & FASTING blood work...    We will contact you w/ the results when available...   We gave you a follow up combination Tetanus vaccine called the TDAP today (it is good for 10 yrs)...  We will also sched a Bone density test & call you w/ the results when avail...  Call for any questions...   Let's plan a follow up visit in 79mo, sooner if needed for problems.Marland KitchenMarland Kitchen

## 2013-08-05 LAB — VITAMIN D 25 HYDROXY (VIT D DEFICIENCY, FRACTURES): Vit D, 25-Hydroxy: 54 ng/mL (ref 30–89)

## 2013-08-07 ENCOUNTER — Other Ambulatory Visit: Payer: Self-pay | Admitting: Pulmonary Disease

## 2013-08-07 MED ORDER — VALSARTAN 160 MG PO TABS: 160.0000 mg | ORAL_TABLET | Freq: Every day | ORAL | Status: DC

## 2013-08-14 ENCOUNTER — Other Ambulatory Visit: Payer: Medicare Other

## 2013-08-18 ENCOUNTER — Other Ambulatory Visit (HOSPITAL_COMMUNITY): Payer: Medicare Other

## 2013-08-21 ENCOUNTER — Other Ambulatory Visit: Payer: Medicare Other

## 2013-08-22 ENCOUNTER — Ambulatory Visit (INDEPENDENT_AMBULATORY_CARE_PROVIDER_SITE_OTHER)
Admission: RE | Admit: 2013-08-22 | Discharge: 2013-08-22 | Disposition: A | Discharge status: Home / Self Care | Payer: Medicare Other | Source: Ambulatory Visit | Attending: Pulmonary Disease | Admitting: Pulmonary Disease

## 2013-08-22 DIAGNOSIS — M899 Disorder of bone, unspecified: Secondary | ICD-10-CM

## 2013-08-22 DIAGNOSIS — M8580 Other specified disorders of bone density and structure, unspecified site: Secondary | ICD-10-CM

## 2013-08-22 DIAGNOSIS — M949 Disorder of cartilage, unspecified: Secondary | ICD-10-CM

## 2013-09-09 ENCOUNTER — Telehealth: Payer: Self-pay | Admitting: Pulmonary Disease

## 2013-09-09 MED ORDER — DICYCLOMINE HCL 10 MG PO CAPS: 10.0000 mg | ORAL_CAPSULE | Freq: Three times a day (TID) | ORAL | Status: DC | PRN

## 2013-09-09 NOTE — Telephone Encounter (Signed)
Pt is aware of SN's recs. Rx has been sent in. Nothing further is needed. 

## 2013-09-09 NOTE — Telephone Encounter (Signed)
Spoke with pt. Reports lower abdomen pain x12 days. Last BM was this morning. Denies nausea, diarrhea, vomiting or constipation. States that when she does have a BM she doesn't feel like she is "getting it all out." Wants to know if there is anything she can do to help this.  Allergies  Allergen Reactions  . Epinephrine     REACTION: heart racing   SN - please advise. Thanks.

## 2013-09-09 NOTE — Telephone Encounter (Signed)
Per SN - Miralax 1/2 to 1 capful with water QD Senokot S 1 po qhs Bentyl 10mg  #50 1 po tid prn abd pain

## 2013-12-17 ENCOUNTER — Encounter: Payer: Self-pay | Admitting: Pulmonary Disease

## 2014-02-04 ENCOUNTER — Encounter: Payer: Self-pay | Admitting: Pulmonary Disease

## 2014-02-04 ENCOUNTER — Ambulatory Visit (INDEPENDENT_AMBULATORY_CARE_PROVIDER_SITE_OTHER): Payer: Medicare Other | Admitting: Pulmonary Disease

## 2014-02-04 VITALS — BP 120/60 | HR 60 | Temp 97.5°F | Ht 62.0 in | Wt 132.2 lb

## 2014-02-04 DIAGNOSIS — E039 Hypothyroidism, unspecified: Secondary | ICD-10-CM

## 2014-02-04 DIAGNOSIS — E785 Hyperlipidemia, unspecified: Secondary | ICD-10-CM

## 2014-02-04 DIAGNOSIS — R7309 Other abnormal glucose: Secondary | ICD-10-CM

## 2014-02-04 DIAGNOSIS — M949 Disorder of cartilage, unspecified: Secondary | ICD-10-CM

## 2014-02-04 DIAGNOSIS — M899 Disorder of bone, unspecified: Secondary | ICD-10-CM

## 2014-02-04 DIAGNOSIS — M8580 Other specified disorders of bone density and structure, unspecified site: Secondary | ICD-10-CM

## 2014-02-04 DIAGNOSIS — I10 Essential (primary) hypertension: Secondary | ICD-10-CM

## 2014-02-04 DIAGNOSIS — K5730 Diverticulosis of large intestine without perforation or abscess without bleeding: Secondary | ICD-10-CM

## 2014-02-04 DIAGNOSIS — Z148 Genetic carrier of other disease: Secondary | ICD-10-CM

## 2014-02-04 DIAGNOSIS — M1990 Unspecified osteoarthritis, unspecified site: Secondary | ICD-10-CM

## 2014-02-04 DIAGNOSIS — D126 Benign neoplasm of colon, unspecified: Secondary | ICD-10-CM

## 2014-02-04 DIAGNOSIS — R413 Other amnesia: Secondary | ICD-10-CM

## 2014-02-04 MED ORDER — AZELASTINE HCL 0.15 % NA SOLN: NASAL | Status: DC

## 2014-02-04 MED ORDER — HYDROCHLOROTHIAZIDE 25 MG PO TABS: 25.0000 mg | ORAL_TABLET | Freq: Every day | ORAL | Status: DC

## 2014-02-04 MED ORDER — VALSARTAN 160 MG PO TABS: 160.0000 mg | ORAL_TABLET | Freq: Every day | ORAL | Status: DC

## 2014-02-04 MED ORDER — VERAPAMIL HCL ER 240 MG PO TBCR: EXTENDED_RELEASE_TABLET | ORAL | Status: DC

## 2014-02-04 MED ORDER — SIMVASTATIN 20 MG PO TABS: ORAL_TABLET | ORAL | Status: DC

## 2014-02-04 MED ORDER — DONEPEZIL HCL 10 MG PO TABS: ORAL_TABLET | ORAL | Status: DC

## 2014-02-04 NOTE — Patient Instructions (Signed)
Today we updated your med list in our EPIC system...    Continue your current medications the same...    We refilled your meds per request...  For your runny nose>    Try the ASTEPRO 1-2 sprays in each nostril as needed...  Call for any questions...  Let's plan a follow up visit in 103mo, sooner if needed for problems.Marland KitchenMarland Kitchen

## 2014-02-04 NOTE — Progress Notes (Signed)
Subjective:    Patient ID: Cynthia Mathis, female    DOB: Apr 03, 1933, 80 y.o.   MRN: [id number]  HPI 80 y/o WF here for a follow up visit... she has mult med problems as noted below ...   ~  sees DrPerry for GI- divertics, polyps, hems & a bout of diverticulitis in Mar08...  ~  sees Dr Risa Grill Urology w/ microscopic hematuria and a neg eval 5/08... 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 ~  sees DrLLomax for Derm w/ alopecia- offered Rogaine but decided against it... ~  Sees DrPenumali for Neuro w/ memory loss, early stage dementia...  ~  May 15, 2012:  3-70mo ROV & [name] has had a prob w/ her BP> when I last saw her she was on Verap240 + Lisinopril30 & BP was ~150/80 but she had a throat clearing tic & mild cough so we switched the ACE to Losartan100; side effect improved somewhat but BP was higher on her home checks & she wanted to go back on the Lisin; she saw TP & her labs showed K=6.0 so the ACE/ ARB was stopped in favor of HCTZ25mg /d; f/u labs showed K back to 5 but home BP checks were up somewhat, therefore ToprolXL25 added;  BP generally better on the combo Verap240, HCT25, MetopER25 & measured 140/80 at day-surg & at DrCornett's office recently & measures 126/72 here today (she says home readings are higher & she will bring cuff to f/u OVs)... She does not HR is slower & measures 50 at rest here today but she is asymptomatic- good energy, not dizzy, no CP/ palpit/ weakness/ SOB/ etc; she plays "pickle ball" regularly & some matches last 3H & she has no difficulty...  In the final analysis she decided to continue the same meds rather than change them further...    She had her RLQ Lipoma removed surgically as outpt by DrCornett 04/17/12, she is glad to be rid of it...    She also tells me that her daugh in [place] was Dx w/ Hemochromatosis; they didn't have any details but she apparently undergoing phlebotomy now; I explained that both she & her husb would have to  be carriers of the gene & we will check her Fe level & Hemochromatosis DNA test...   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  ~  September 17, 2012:  78mo ROV & Cynthia Mathis is concerned about her memory; husb has been under the care of DrLove for >1yrs w/ mild cognitive impairment, her MMSE is abn & we will refer to [facility] Neuro for further assessment & scans... We reviewed the following medical problems during today's office visit >>     Macular Degen> followed by DrHecker & DrRankin...    HBP> on ASA81, Metop25, Verap240, HCT25; BP= 140/80 & she denies HA, CP, palpit, dizzy, SOB, edema, etc...    Hyperlipid> on Simva20; last FLP 1/13 showed TChol 169, TG 118, HDL 56, LDL 89; she is not fasting today...    Hx Impaired FBS> Hx BS in the 130 range, but all readings ~100 x yrs now;  Labs 3/14 showed BS=106, A1c=6.2.Marland KitchenMarland Kitchen    Hypothy> hx multinod thyroid x yrs; had thyroidectomy 1990 by DrWeatherly; she weaned off Thyroid hormone yrs ago & has remained clinically & biochem euthyroid; TSH= 0.37    GI- Divertics, Polyps> hx diverticulitis in 2008- followed by DrPerry; last colonoscopy 4/13 showed severe diverticulosis w/o inflamm  or polyps seen...    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    DJD, Osteoporosis> on Ca, MVI, Glucosamine; she uses OTC analgesic meds as needed; see BMDs below- on Bisphos drug holiday since 7/11 & due for f/u BMD...    Memory Loss> she is c/o difficulty w/ name, dates, etc; MMSE is abnormal & we will refer to [facility]...    Anxiety> no requiring meds... We reviewed prob list, meds, xrays and labs> see below for updates >>   LABS 3/14:  Chems- wnl w/ BS=106, A1c=6.2;  TSH=0.37;  Sed=14...    ~  January 23, 2013:  84mo ROV & Cynthia Mathis notes that her memory is slipping some & her hearing is not as good;  She had f/u visit  w/ DrPenumalli 3/14> her MMSE was 29/30 & he planned to check MRI Brain & B12 level, he did not rec starting meds yet (husb takes Razadyne & feels it helps him); he does rec fresh fruits & vegetables + physical exercise...    BP is controlled on ToprolXL25, CalanSR240, & HCT25; BP= 142/78 & she denies HA, CP, palpit, SOB, edema...    Chol is controlled on Simva20 + Fish Oil; Last FLP was 1/13 as she cannot seem to remember to come fasting for blood work (see below)...    She remains on a bisphos drug holiday & needs f/u BMD soon to re-assess...  We reviewed prob list, meds, xrays and labs> see below for updates >>  ADDENDUM 04/15/13 >> pt called & wants off Simva20 after reading something about memory loss=> change to Atorva20 (she never did)...  ~  August 04, 2013:  9mo ROV & Cynthia Mathis states she's doing well- no new complaints or concerns...    Macular Degen> followed by DrHecker & DrRankin...    HBP> on ASA81, Metop25, Verap240, HCT25; BP= 140/82 & she denies HA, CP, palpit, dizzy, SOB, edema, etc; she is bradycardic & we decided to stop ToprolXL & replace it w/ [id number]...    Hyperlipid> on Simva20; last FLP 1/15 showed TChol 155, TG 122, HDL 50, LDL 81; she is not fasting today...    Hx Impaired FBS> Hx BS in the 130 range, but all readings ~100 x yrs now;  Labs 1/15 showed BS=103    Hypothy> hx multinod thyroid x yrs; had thyroidectomy 1990 by DrWeatherly; she weaned off Thyroid hormone yrs ago & has remained clinically & biochem euthyroid; TSH= 0.59    GI- Divertics, Polyps> hx diverticulitis in 2008- followed by DrPerry; last colonoscopy 4/13 showed severe diverticulosis w/o inflamm or polyps seen...    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 Hg= 14.9    DJD, Osteoporosis> on Ca, MVI, Glucosamine; she uses OTC analgesic meds as needed; see BMDs below- on Bisphos drug holiday since 7/11 & due for f/u  BMD...    Memory Loss> she is c/o difficulty w/ name, dates, etc; MMSE is abnormal & eval by [facility] Neuro 4/14, DrPenumalli; felt to have mild cognitive impairment vs normal aging, did not rec starting meds... Exam today w/ 0/3 recall after distraction, start Aricept trial.    Anxiety> no requiring meds... We reviewed prob list, meds, xrays and labs> see below for updates >> given TDAP today...  CXR 1/15 showed heart at upper lim of norm, lungs clear (mild dev trachea to right c/w enlarged  left lobe of thyroid- no change)...  EKG 1/15 showed SBrady, rate44, NSSTTW changes...   LABS 1/15:  FLP- at goals on Simva20;  Chems- wnl;  CBC- wnl;  TSH=0.59;  VitD=54... NOTE:  She wants to try ARICEPT10 start 1/2 => 1 tab daily as trial; Pulse is low at 48 & we decided to stop ToprolXL & replace it w/ VALSARTAN160/d...  ~  February 04, 2014:  31mo ROV & [name] has several minor somatic complaints but overall stable & feels well; our scale shows 13# wt loss to 132# today- she states good appetite, eating satis, "I get lots of exercise playng pickle ball" she says;  She notes that feet/ toes are cold (no pain, no sores), unusual feeling in legs at night, sweats alot, runny nose, etc;  She reallly dosen't want more meds but will try Astepro for the rhinorrhea...      Last OV we stopped her Metoprolol due to bradycardia & substituted Diovan160 for her BP> now on this +CalanSR240 & HCT25; BP= 120/60 & denies HA, CP, palpit, SOB, edema, etc...     She remains on Simva20 w/ good control of Lipids...    She remains on Aricept10 & feels that her memory is stable, denies deterioration...  We reviewed prob list, meds, xrays and labs> see below for updates >> MEDS refuilled per request...  LABS, Mammogram, CXR, BMD, EKG> all reviewed ...            Problem List:  MACULAR DEGENERATION (ICD-362.50) - eval by DrHecker w/ foveal telangiectasia, macular drusen, macular degeneration, and cataracts- being followed  regularly... vision is preserved so far... ~  11/11:  f/u DrHecker- stable age related mac degen, no retinopathy. ~  11/12:  f/u DrHecker- has advanced bilat AMD, sent to [facility], we don't have his notes...  HYPERTENSION (ICD-401.9) - on VERAPAMIL 240mg /d,  TOPROL-XL 25mg /d,  HCTZ 25mg /d... Prev on Lisinopril but this was stopped due to throat clearing tic & mild cough, plus K=6.0 at the time (all improved off the ACE)... ~  8/10: borderline BP here and she will monitor at home carefully & call if not <150/90. ~  7/11: she reports that she incr the Lisinopril to 1&1/2 tabs due to BP~150+, now better... ~  7/12:  BP stable on Verap240 & Lisinopril 30mg /d> BP= 140/68 today> denies HA, fatigue, visual changes, CP, palipit, dizziness, syncope, dyspnea, edema, etc... ~  1/13:  BP= 160/90 (didn't take med today); states BP are ok at home; continue same meds + low sodium etc... ~  CXR 1/13 showed norm heart size, clear lungs, mild thor spondylosis, NAD.[name]. ~  7/13:  BP= 152/82 & she notes some troublesome throat clearing & mild cough; we decided to STOP the ACE & switch to LOSARTAN 100mg /d, monitor her symptoms and her blood presure... ~  CXR 9/13 showed calcif Ao, clear lungs, suspect left thyroid enlargement, DJD in Tspine... ~  EKG 10/13 showed SBrady, rate58, wnl, NAD... ~  11/13: she had long convoluted hx of BP med changes- see above- now on Verap240, MetopER25, HCT25, & BP= 126/72; we discussed options but she is content to continue these same meds for now & watch BP... ~  3/14: on ASA81, Metop25, Verap240, HCT25; BP= 140/80 & she denies HA, CP, palpit, dizzy, SOB, edema, etc. ~  1/15: on ASA81, Metop25, Verap240, HCT25; BP= 140/82 & she remains essen asymptomatic; she is bradycardic & we decided to stop ToprolXL & replace it w/ [id number] ~  7/15: on ASA81,  Diovan160, Verap240, HCT25; BP= 120/60 & she is essentially asymptomatic, pulse=60, continue same meds...  DYSLIPIDEMIA (ICD-272.4) - on ZOCOR  20mg /d... plus doing well on diet... ~  Gallia 6/09 showed TChol 144, TG 140, HDL 39, LDL 77... continue same. ~  FLP 2/10 showed TChol 164, TG 77, HDL 54, LDL 95 ~  FLP 8/10 showed TChol 138, TG 51, HDL 48, LDL 80 ~  FLP 1/11 showed TChol 155, TG 100, HDL 48, LDL 87 ~  FLP 7/11 showed TChol 158, TG 74, HDL 55, LDL 89 ~  FLP 1/12 showed TChol 155, TG 101, HDL 50, LDL 85 ~  FLP 1/13 on Simva20 showed TChol 169, TG 118, HDL 56, LDL 89  ~  FLP 1/15 on Simva20 showed TChol 155, TG 122, HDL 50, LDL 81  Hx Impaired Fasting Glucose >> on diet alone... FBS @ home betw 120-130 recently... she is on a good diet and weight is down at 139# today (63"  BMI= 24)... ~  labs 3/09 showed FBS= 119... subseq HgA1c 5/09 was 6.4... diet Rx. ~  labs 8/09 showed BS= 106, HgA1c= 5.9.Marland KitchenMarland Kitchen rec- same. ~  labs 2/10 showed BS= 102, A1c= 5.9 ~  labs 1/11 showed BS= 98 ~  labs 1/12 showed BS= 95 ~  Labs 1/13 showed BS= 101 ~  Labs 3/14 showed BS=106, A1c=6.2 ~  Labs 1/15 showed BS= 103  HYPOTHYROIDISM (ICD-244.9) - off Synthroid now...  clinically & biochemically euthyroid... remote hx of overactive thyroid many yrs ago in [place] (we don't have records)... she had an eval by DrKohut in 1987 w/ familial multinodular goiter w/ neg FNA... started on Rx but goiter enlarged- had thyroidectomy 1990 by DrWeatherly... ~  labs 3/09 showed TSH = 0.35 (0.35-5.5)... we have slowly decr her dose to 18mcg/d... ~  labs 8/09 showed TSH= 0.33... rec- decr the Synth50 to 1/2 tab daily. ~  labs 2/10 showed =TSH= 0.54 and Synthroid stopped... ~  labs 4/10 off med showed TSH= 0.59 ~  labs 8/10 showed TSH= 0.60 ~  labs 1/11 showed TSH= 0.60 ~  labs 7/11 showed TSH= 0.56 ~  labs 1/12 showed TSH= 0.60 ~  Labs 1/13 showed TSH= 0.58 ~  Labs 3/14 showed TSH= 0.37 ~  Labs 1/15 showed TSH= 0.59  DIVERTICULOSIS OF COLON (ICD-562.10) COLONIC POLYPS (ICD-211.3) - followed by DrPerry... bout of diverticulitis in Mar08... ~  last colonoscopy 3/08 showed  divertics, 19mm polyp, hems... path=hyperplastic, f/u planned 46yrs. ~  F/u colonoscopy 4/13 by DrPerry showed severe diverticulosis w/o inflamm and w/o polyps etc...  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  HEMATURIA, HX OF (ICD-V13.09) - eval 4/08 by DrGrapey w/ neg Sonar, Cysto, cytology...  GYN >> DrKRichardson, Mammograms at [facility]...  DEGENERATIVE JOINT DISEASE (ICD-715.90) - c/o discomfort in her shoulders and knees... uses OTC meds as needed.  Hx of OSTEOPOROSIS (ICD-733.00) - on Fosamax 70/wk thru 7/11, Caltrate 1/d, MVI, Vit D 1000/d... ~  initial BMD 10/01 showed TScores -0.2  to -3.2 in the spine...  ~  f/u studies w/ steady improvement on Fosamax therapy... ~  BMD 3/09 w/ TScores -0.1 to -1.7 in the spine... continue Rx. ~  BMD 7/11 showed TScores -2.0 Spine, and -0.3 in right FemNeck... we opted for Bisphos drug holiday to start now. ~  Labs 1/13  showed Vit D level = 48, continue same...  ? of MEMORY LOSS (ICD-780.93) - MMSE 5/09 by Doran Heater, NP was 29/30... Reassured. ~  3/14: she presented w/ c/o worsening memory & MMSE has diminished- refer to [facility] for eval & rx... ~  Neuro eval 4/14 by DrPenumalli> mild cognitive impairment vs norm aging, he did not rec starting meds... ~  CT Brain 4/14 showed mild atrophy & sm vessel dis ~  She continues to f/u w/ Neurology periodically... ~  1/15: ROV here & husb more concerned re her memory, recall was 0/3 after distraction; husb has apparently done well on aricept 7 she wants trial, start ARICEPT5=>10 daily...  ANXIETY (ICD-300.00) - prev on Alpraz Prn but hasn't used in yrs...  DERMATOLOGY >> LIPOMA (ICD-214.9) - notes large lipoma in RLQ/ rt flank area... some discomfort noted... seen by DrLeone in the past w/o  surg... she saw DrCornett, CCS, 1/11 for second opinion & she will decide re: excision... ~  5/13:  She had Basal cell ca removed from the left side of her nose, w/ subseq deeper margins & reports everything is OK... ~  7/13:  She is concerned that the lipoma may be growing & she requests f/u DrCornett for excision... ~  10/13:  Lipoma removed by DrCornett in outpt surg 04/17/12...  SHINGLES - remote hx of right T8-9 shingles in 1986...   Past Surgical History  Procedure Laterality Date  . Thyroidectomy  1990    [name]  . Appendectomy  1947  . Colonoscopy    . Mass excision  04/17/2012    Procedure: EXCISION MASS;  Surgeon: [name], MD;  Location: [facility];  Service: General;  Laterality: N/A;    Outpatient Encounter Prescriptions as of 02/04/2014  Medication Sig  . aspirin 81 MG tablet Take 81 mg by mouth daily.  . Calcium Carbonate-Vitamin D (CALCIUM 600+D) 600-400 MG-UNIT per tablet Take 1 tablet by mouth daily.  [name] donepezil (ARICEPT) 10 MG tablet Take 1/2 tablet by mouth daily x 1 month, then 1 daily thereafter.  . fish oil-omega-3 fatty acids 1000 MG capsule Take 1 g by mouth daily.  [name] glucosamine-chondroitin 500-400 MG tablet Take 1 tablet by mouth 3 (three) times daily.  . hydrochlorothiazide (HYDRODIURIL) 25 MG tablet Take 1 tablet (25 mg total) by mouth daily.  . Multiple Vitamins-Minerals (MULTIVITAMIN & MINERAL PO) Take 1 tablet by mouth daily.  . simvastatin (ZOCOR) 20 MG tablet TAKE 1 TABLET (20 MG TOTAL) BY MOUTH AT BEDTIME.  . valsartan (DIOVAN) 160 MG tablet Take 1 tablet (160 mg total) by mouth daily.  . verapamil (CALAN-SR) 240 MG CR tablet TAKE 1 TABLET (240 MG TOTAL)  BY MOUTH AT BEDTIME.  . [DISCONTINUED] donepezil (ARICEPT) 10 MG tablet Take 1/2 tablet by mouth daily x 1 month, then 1 daily thereafter.  . [DISCONTINUED] donepezil (ARICEPT) 10 MG tablet Take 1/2 tablet by mouth daily x 1 month, then 1 daily thereafter.  .  [DISCONTINUED] hydrochlorothiazide (HYDRODIURIL) 25 MG tablet Take 1 tablet (25 mg total) by mouth daily.  . [DISCONTINUED] simvastatin (ZOCOR) 20 MG tablet TAKE 1 TABLET (20 MG TOTAL) BY MOUTH AT BEDTIME.  . [DISCONTINUED] valsartan (DIOVAN) 160 MG tablet Take 1 tablet (160 mg total) by mouth daily.  . [DISCONTINUED] valsartan (DIOVAN) 160 MG tablet Take 1 tablet (160 mg total) by mouth daily.  . [DISCONTINUED] verapamil (CALAN-SR) 240 MG CR tablet TAKE 1 TABLET (240 MG TOTAL)  BY MOUTH AT  BEDTIME.  [name] Azelastine HCl 0.15 % SOLN 1-2 sprays in each nostril as needed  . [DISCONTINUED] dicyclomine (BENTYL) 10 MG capsule Take 1 capsule (10 mg total) by mouth 3 (three) times daily as needed (for abdominal pain).    Allergies  Allergen Reactions  . Epinephrine     REACTION: heart racing    Current Medications, Allergies, Past Medical History, Past Surgical History, Family History, and Social History were reviewed in [employer] record.    Review of Systems        See HPI - all other systems neg except as noted...  The patient complains of dyspnea on exertion.  The patient denies anorexia, fever, weight loss, weight gain, vision loss, decreased hearing, hoarseness, chest pain, syncope, peripheral edema, prolonged cough, headaches, hemoptysis, abdominal pain, melena, hematochezia, severe indigestion/heartburn, hematuria, incontinence, muscle weakness, suspicious skin lesions, transient blindness, difficulty walking, depression, unusual weight change, abnormal bleeding, enlarged lymph nodes, and angioedema.   Objective:   Physical Exam    WD, WN, 80 y/o WF in NAD... VITAL SIGNS:  Reviewed... GENERAL:  Alert & oriented; pleasant & cooperative... HEENT:  [place]/AT, EOM-wnl, PERRLA, EACs-clear, TMs-wnl, NOSE-clear, THROAT-clear & wnl. NECK:  Supple w/ fair ROM; no JVD; normal carotid impulses w/o bruits; scar of prev thyroid surg- no nodules or goiter; no  lymphadenopathy. CHEST:  Clear to P & A; without wheezes/ rales/ or rhonchi. HEART:  Regular Rhythm; without murmurs/ rubs/ or gallops. ABDOMEN:  Soft & nontender; normal bowel sounds; no organomegaly or masses detected. EXT: without deformities, mild arthritic changes; no varicose veins/ venous insuffic/ or edema. prob heel spurs w/ some plantar fascia pain> advised soaks & stretching... NEURO:  CN's intact;  no focal neuro deficits... DERM:  Scar in right flank after lipoma excision... no other lesions noted...  MMSE 09/17/12>  She did not know the Pres ("I can see him, a black guy"), VP, or Gov;  Didn't know prev pres etc;  Recall 3/3 immed, but only 1/3 after distraction;  Serial 7s & WORLD backwards were fluent; proverbs were abstracted normally...   RADIOLOGY DATA:  Reviewed in the EPIC EMR & discussed w/ the patient...  LABORATORY DATA:  Reviewed in the EPIC EMR & discussed w/ the patient...   Assessment & Plan:    Macular Degen>  Followed by DrHecker/ [facility] & stable per pt hx...  HBP>  Now on Verap, Diovan, HCT- along w/ no salt; & BP appears to be well controlled & pulse improved (60/min)  DYSLIPIDEMIA>  Stable on diet & Simva20...  Borderline DM>  Hx BS in the 120-130 range yrs ago but stable ~100 for the last 3+yrs on diet alone...  Hx Hypothyroidism>  She weaned off Synthroid several yrs ago & TSH has remained normal, clinically euthyroid as well...  GI>  Divertics, Colon Polyps>  Stable & up to date w/ f/u colon 4/13 from DrPerry==> divertics, no polyps...  LIPOMA>  Removed 10/13 from RLQ area by DrCornett...  DJD>  She notes some right knee discomfort playing "pickle ball" & is advised to wear knee brace, take Aleve prn (she declines further eval or prescription meds)...  Osteoporosis>  Hx BMD in 2001 w/ TScore =3.2 in Spine; Rx'd Fosamax for 34yrs w/ steady improvement & on Bisphos drug holiday since 7/11 (plan is to repeat BMD); rec to continue supplements w/  Calcium, MVI, Vit D...  HEMOCHROMATOSIS Gene carrier >> daugh diagnoses w/ hemochromatosis, on phlebotomy; pt is, as expected, heterozygous for the C282Y  gene.Marland KitchenMarland Kitchen  MEMORY LOSS>  More noticeable memory decline; they request trial of meds=> start Aricept5 => 10 daily...  Other medical problems as listed...   Patient's Medications  New Prescriptions   AZELASTINE HCL 0.15 % SOLN    1-2 sprays in each nostril as needed  Previous Medications   ASPIRIN 81 MG TABLET    Take 81 mg by mouth daily.   CALCIUM CARBONATE-VITAMIN D (CALCIUM 600+D) 600-400 MG-UNIT PER TABLET    Take 1 tablet by mouth daily.   FISH OIL-OMEGA-3 FATTY ACIDS 1000 MG CAPSULE    Take 1 g by mouth daily.   GLUCOSAMINE-CHONDROITIN 500-400 MG TABLET    Take 1 tablet by mouth 3 (three) times daily.   MULTIPLE VITAMINS-MINERALS (MULTIVITAMIN & MINERAL PO)    Take 1 tablet by mouth daily.  Modified Medications   Modified Medication Previous Medication   DONEPEZIL (ARICEPT) 10 MG TABLET donepezil (ARICEPT) 10 MG tablet      Take 1/2 tablet by mouth daily x 1 month, then 1 daily thereafter.    Take 1/2 tablet by mouth daily x 1 month, then 1 daily thereafter.   HYDROCHLOROTHIAZIDE (HYDRODIURIL) 25 MG TABLET hydrochlorothiazide (HYDRODIURIL) 25 MG tablet      Take 1 tablet (25 mg total) by mouth daily.    Take 1 tablet (25 mg total) by mouth daily.   SIMVASTATIN (ZOCOR) 20 MG TABLET simvastatin (ZOCOR) 20 MG tablet      TAKE 1 TABLET (20 MG TOTAL) BY MOUTH AT BEDTIME.    TAKE 1 TABLET (20 MG TOTAL) BY MOUTH AT BEDTIME.   VALSARTAN (DIOVAN) 160 MG TABLET valsartan (DIOVAN) 160 MG tablet      Take 1 tablet (160 mg total) by mouth daily.    Take 1 tablet (160 mg total) by mouth daily.   VERAPAMIL (CALAN-SR) 240 MG CR TABLET verapamil (CALAN-SR) 240 MG CR tablet      TAKE 1 TABLET (240 MG TOTAL)  BY MOUTH AT BEDTIME.    TAKE 1 TABLET (240 MG TOTAL)  BY MOUTH AT BEDTIME.  Discontinued Medications   DICYCLOMINE (BENTYL) 10 MG CAPSULE     Take 1 capsule (10 mg total) by mouth 3 (three) times daily as needed (for abdominal pain).

## 2014-04-23 ENCOUNTER — Ambulatory Visit: Payer: Medicare Other | Admitting: Diagnostic Neuroimaging

## 2014-05-25 ENCOUNTER — Telehealth: Payer: Self-pay | Admitting: Pulmonary Disease

## 2014-05-25 NOTE — Telephone Encounter (Signed)
lmomtcb x1 

## 2014-05-25 NOTE — Telephone Encounter (Signed)
E-Mail from [employer] from [place] copied below:  To keep our medical records straight we would like to know what services were performed by [name] on my August 04, 2013 appointment.  Please e-mail me the information  Thank you, Maxie Ludlum

## 2014-05-26 NOTE — Telephone Encounter (Signed)
Per the 1.26.15 ov w/ SN: Patient Instructions       Today we updated your med list in our EPIC system...    Continue your current medications the same...  We decided to add ARICEPT (Donepizil) for your memory>>    Start w/ 1/2 tab daily for 1 month, then go up to 1 tab daily thereafter...  Today we did your follow up CXR, EKG, & FASTING blood work...    We will contact you w/ the results when available...   We gave you a follow up combination Tetanus vaccine called the TDAP today (it is good for 10 yrs)...  We will also sched a Bone density test & call you w/ the results when avail...  Call for any questions...    Let's plan a follow up visit in 75mo, sooner if needed for problems...  ~~~~~~~~~~~~ Labs ordered were CBCD, BMET, LFT, TSH, Vit D, Lipid panel. As pt requested email be sent to her, this has been done Will sign off

## 2014-06-29 ENCOUNTER — Ambulatory Visit: Payer: Medicare Other | Admitting: Diagnostic Neuroimaging

## 2014-07-01 ENCOUNTER — Ambulatory Visit (INDEPENDENT_AMBULATORY_CARE_PROVIDER_SITE_OTHER): Payer: Medicare Other | Admitting: Diagnostic Neuroimaging

## 2014-07-01 ENCOUNTER — Encounter: Payer: Self-pay | Admitting: Diagnostic Neuroimaging

## 2014-07-01 VITALS — BP 147/68 | HR 56 | Temp 97.9°F | Ht 62.0 in | Wt 134.2 lb

## 2014-07-01 DIAGNOSIS — R413 Other amnesia: Secondary | ICD-10-CM

## 2014-07-01 MED ORDER — MEMANTINE HCL 10 MG PO TABS: 10.0000 mg | ORAL_TABLET | Freq: Two times a day (BID) | ORAL | Status: DC

## 2014-07-01 MED ORDER — MEMANTINE HCL 5 MG PO TABS: 5.0000 mg | ORAL_TABLET | Freq: Two times a day (BID) | ORAL | Status: DC

## 2014-07-01 NOTE — Progress Notes (Signed)
[facility]  PATIENT: Cynthia Mathis DOB: 1932/10/09  REFERRING CLINICIAN: Nadel HISTORY FROM: patient and husband REASON FOR VISIT: follow up   HISTORICAL  CHIEF COMPLAINT:  Chief Complaint  Patient presents with  . Follow-up    memory loss    HISTORY OF PRESENT ILLNESS:   UPDATE 07/01/14: Since last visit, memory loss has progressed. Now on donepezil 10mg  qhs per [name]. Tolerating without side effects.  UPDATE 04/23/13: Since last visit patient continues to have short-term memory problems. This is when she has conversations with her husband and then cannot remember the details later. She also has some difficulty remembering peoples names after meeting them. Other people have not noticed any significant memory problems. Patient notices these problems primarily herself. Patient denies any significant depression. He does have some anxiety feelings with tense turning feeling in her stomach especially if her husband gets upset with her.  PRIOR HPI (10/07/12): 80 year old right-handed female here for evaluation of memory loss. Patient accompanied by her husband. For past one year patient is having increasing trouble with forgetting things. She's having to rely on taking notes, maintaining a calendar, using lists to remember things. Patient accompanied by her husband, who also has been diagnosed with mild cognitive impairment versus mild dementia by [name]. He takes galantamine, which he says has helped his memory. Other examples include forgetting recent conversations, recent events, repeating questions over and over. Unfortunately the only informant of this problem is the patient's husband, who also happens to have mild cognitive impairment or dementia. No other family members or friends have noticed this problem in the patient. Otherwise patient is doing well still able to maintain activities of daily living including cooking, cleaning, driving,  shopping.   REVIEW OF SYSTEMS: Full 14 system review of systems performed and notable only for excess sweating memory loss speech diff heat intolerance excess thirst loss of vision hearing loss flushing.   ALLERGIES: Allergies  Allergen Reactions  . Epinephrine     REACTION: heart racing    HOME MEDICATIONS: Outpatient Prescriptions Prior to Visit  Medication Sig Dispense Refill  . aspirin 81 MG tablet Take 81 mg by mouth daily.    . Calcium Carbonate-Vitamin D (CALCIUM 600+D) 600-400 MG-UNIT per tablet Take 1 tablet by mouth daily.    [name] donepezil (ARICEPT) 10 MG tablet Take 1/2 tablet by mouth daily x 1 month, then 1 daily thereafter. 90 tablet 3  . fish oil-omega-3 fatty acids 1000 MG capsule Take 1 g by mouth daily.    [name] glucosamine-chondroitin 500-400 MG tablet Take 1 tablet by mouth 3 (three) times daily.    . hydrochlorothiazide (HYDRODIURIL) 25 MG tablet Take 1 tablet (25 mg total) by mouth daily. 90 tablet 3  . Multiple Vitamins-Minerals (MULTIVITAMIN & MINERAL PO) Take 1 tablet by mouth daily.    . simvastatin (ZOCOR) 20 MG tablet TAKE 1 TABLET (20 MG TOTAL) BY MOUTH AT BEDTIME. 90 tablet 3  . verapamil (CALAN-SR) 240 MG CR tablet TAKE 1 TABLET (240 MG TOTAL)  BY MOUTH AT BEDTIME. 90 tablet 3  . Azelastine HCl 0.15 % SOLN 1-2 sprays in each nostril as needed 30 mL 1  . valsartan (DIOVAN) 160 MG tablet Take 1 tablet (160 mg total) by mouth daily. 90 tablet 3   No facility-administered medications prior to visit.    PAST MEDICAL HISTORY: Past Medical History  Diagnosis Date  . Macular degeneration   . Hypertension   . Dyslipidemia   . Borderline  diabetes mellitus   . Hypothyroid   . Diverticulosis of colon   . Hx of colonic polyps   . History of hematuria   . DJD (degenerative joint disease)   . History of osteoporosis   . Memory loss   . Anxiety   . Lipoma   . Hyperlipidemia     PAST SURGICAL HISTORY: Past Surgical History  Procedure Laterality Date  .  Thyroidectomy  1990    [name]  . Appendectomy  1947  . Colonoscopy    . Mass excision  04/17/2012    Procedure: EXCISION MASS;  Surgeon: [name], MD;  Location: [facility];  Service: General;  Laterality: N/A;    FAMILY HISTORY: Family History  Problem Relation Age of Onset  . Colon cancer Neg Hx   . Stomach cancer Neg Hx   . Stroke Mother     SOCIAL HISTORY:  History   Social History  . Marital Status: Married    Spouse Name: Mariane Masters [place]    Number of Children: 3  . Years of Education: [employer]   Occupational History  . seamstress    Social History Main Topics  . Smoking status: Never Smoker   . Smokeless tobacco: Never Used  . Alcohol Use: Yes     Comment: occasionally  . Drug Use: No  . Sexual Activity: Not on file   Other Topics Concern  . Not on file   Social History Narrative   Pt lives at home with her spouse Mariane Masters [place], who has been dx'd with mild cognitive impairment vs. mild dementia, former pt of [name]). She does not use caffeine, if so, rarely.     PHYSICAL EXAM  Filed Vitals:   07/01/14 1316  BP: 147/68  Pulse: 56  Temp: 97.9 F (36.6 C)  TempSrc: Oral  Height: 5\' 2"  (1.575 m)  Weight: 134 lb 3.2 oz (60.873 kg)   Body mass index is 24.54 kg/(m^2).  No flowsheet data found.   Montreal Cognitive Assessment  07/01/2014  Visuospatial/ Executive (0/5) 3  Naming (0/3) 3  Attention: Read list of digits (0/2) 2  Attention: Read list of letters (0/1) 1  Attention: Serial 7 subtraction starting at 100 (0/3) 3  Language: Repeat phrase (0/2) 1  Language : Fluency (0/1) 1  Abstraction (0/2) 2  Delayed Recall (0/5) 0  Orientation (0/6) 4  Total 20  Adjusted Score (based on education) 21    GENERAL EXAM: Patient is in no distress  CARDIOVASCULAR: Regular rate and rhythm, no murmurs, no carotid bruits  NEUROLOGIC: MENTAL STATUS: awake, alert, language fluent, comprehension intact, naming intact;  AFT 11. NO FRONTAL RELEASE SIGNS. CRANIAL NERVE: pupils equal and reactive to light, visual fields full to confrontation, extraocular muscles intact, no nystagmus, facial sensation and strength symmetric, uvula midline, shoulder shrug symmetric, tongue midline. MOTOR: normal bulk and tone, full strength in the BUE, BLE SENSORY: normal and symmetric to light touch, pinprick, temperature, vibration. COORDINATION: finger-nose-finger, fine finger movements normal REFLEXES: deep tendon reflexes present and symmetric GAIT/STATION: narrow based gait; able to walk tandem; romberg is negative   DIAGNOSTIC DATA (LABS, IMAGING, TESTING) - I reviewed patient records, labs, notes, testing and imaging myself where available.  Lab Results  Component Value Date   WBC 10.2 08/04/2013   HGB 14.9 08/04/2013   HCT 43.7 08/04/2013   MCV 90.2 08/04/2013   PLT 227.0 08/04/2013      Component Value Date/Time   NA 141  08/04/2013 1052   K 3.9 08/04/2013 1052   CL 102 08/04/2013 1052   CO2 30 08/04/2013 1052   GLUCOSE 103* 08/04/2013 1052   BUN 15 08/04/2013 1052   CREATININE 0.8 08/04/2013 1052   CALCIUM 9.2 08/04/2013 1052   PROT 7.3 08/04/2013 1052   ALBUMIN 4.0 08/04/2013 1052   AST 21 08/04/2013 1052   ALT 19 08/04/2013 1052   ALKPHOS 55 08/04/2013 1052   BILITOT 0.9 08/04/2013 1052   GFRNONAA 67* 04/15/2012 1500   GFRAA 78* 04/15/2012 1500   Lab Results  Component Value Date   CHOL 155 08/04/2013   HDL 50.00 08/04/2013   LDLCALC 81 08/04/2013   TRIG 122.0 08/04/2013   CHOLHDL 3 08/04/2013   Lab Results  Component Value Date   HGBA1C 6.2 09/17/2012   Lab Results  Component Value Date   TRZNBVAP01 410 10/07/2012   Lab Results  Component Value Date   TSH 0.59 08/04/2013    10/16/12 MRI brain - mild atrophy and mild chronic small vessel ischemic disease.    ASSESSMENT AND PLAN  80 y.o. year old female  has a past medical history of Macular degeneration; Hypertension;  Dyslipidemia; Borderline diabetes mellitus; Hypothyroid; Diverticulosis of colon; colonic polyps; History of hematuria; DJD (degenerative joint disease); History of osteoporosis; Memory loss; Anxiety; Lipoma; and Hyperlipidemia. here with history of short-term memory problems since March 2012. Neurologic examination today is unremarkable. MMSE 29/30 --> 28/30. MOCA 21/30 --> 23/30 --> 21/30.  Dx: mild cognitive impairment vs mild dementia  PLAN: 1. Add memantine to donepezil due to subjective worsening over past 1 year  Meds ordered this encounter  Medications  . memantine (NAMENDA) 5 MG tablet    Sig: Take 1 tablet (5 mg total) by mouth 2 (two) times daily. Take 5mg  twice a day for 1 month, then increase to 10mg  twice a day.    Dispense:  60 tablet    Refill:  0  . memantine (NAMENDA) 10 MG tablet    Sig: Take 1 tablet (10 mg total) by mouth 2 (two) times daily.    Dispense:  60 tablet    Refill:  12   Return in about 6 months (around 12/31/2014).    Penni Bombard, MD 30/13/1438, 8:87 PM Certified in Neurology, Neurophysiology and Neuroimaging  [facility] [address], [place], [place] [address] [phone]

## 2014-07-20 DIAGNOSIS — H3532 Exudative age-related macular degeneration: Secondary | ICD-10-CM | POA: Diagnosis not present

## 2014-07-20 DIAGNOSIS — H35051 Retinal neovascularization, unspecified, right eye: Secondary | ICD-10-CM | POA: Diagnosis not present

## 2014-07-22 DIAGNOSIS — H25013 Cortical age-related cataract, bilateral: Secondary | ICD-10-CM | POA: Diagnosis not present

## 2014-07-22 DIAGNOSIS — H40033 Anatomical narrow angle, bilateral: Secondary | ICD-10-CM | POA: Diagnosis not present

## 2014-07-22 DIAGNOSIS — H2513 Age-related nuclear cataract, bilateral: Secondary | ICD-10-CM | POA: Diagnosis not present

## 2014-07-22 DIAGNOSIS — H3532 Exudative age-related macular degeneration: Secondary | ICD-10-CM | POA: Diagnosis not present

## 2014-08-10 ENCOUNTER — Ambulatory Visit (INDEPENDENT_AMBULATORY_CARE_PROVIDER_SITE_OTHER): Payer: Medicare Other | Admitting: Pulmonary Disease

## 2014-08-10 ENCOUNTER — Ambulatory Visit: Payer: Medicare Other | Admitting: Pulmonary Disease

## 2014-08-10 ENCOUNTER — Encounter: Payer: Self-pay | Admitting: Pulmonary Disease

## 2014-08-10 VITALS — BP 128/76 | HR 55 | Temp 97.9°F | Ht 62.0 in | Wt 136.0 lb

## 2014-08-10 DIAGNOSIS — E890 Postprocedural hypothyroidism: Secondary | ICD-10-CM | POA: Diagnosis not present

## 2014-08-10 DIAGNOSIS — R7301 Impaired fasting glucose: Secondary | ICD-10-CM

## 2014-08-10 DIAGNOSIS — M150 Primary generalized (osteo)arthritis: Secondary | ICD-10-CM

## 2014-08-10 DIAGNOSIS — K5730 Diverticulosis of large intestine without perforation or abscess without bleeding: Secondary | ICD-10-CM | POA: Diagnosis not present

## 2014-08-10 DIAGNOSIS — I10 Essential (primary) hypertension: Secondary | ICD-10-CM | POA: Diagnosis not present

## 2014-08-10 DIAGNOSIS — Z148 Genetic carrier of other disease: Secondary | ICD-10-CM

## 2014-08-10 DIAGNOSIS — M8580 Other specified disorders of bone density and structure, unspecified site: Secondary | ICD-10-CM

## 2014-08-10 DIAGNOSIS — R413 Other amnesia: Secondary | ICD-10-CM

## 2014-08-10 DIAGNOSIS — E785 Hyperlipidemia, unspecified: Secondary | ICD-10-CM

## 2014-08-10 DIAGNOSIS — M159 Polyosteoarthritis, unspecified: Secondary | ICD-10-CM

## 2014-08-10 DIAGNOSIS — H9193 Unspecified hearing loss, bilateral: Secondary | ICD-10-CM

## 2014-08-10 DIAGNOSIS — F419 Anxiety disorder, unspecified: Secondary | ICD-10-CM

## 2014-08-10 DIAGNOSIS — D126 Benign neoplasm of colon, unspecified: Secondary | ICD-10-CM

## 2014-08-10 NOTE — Patient Instructions (Signed)
Today we updated your med list in our EPIC system...    Continue your current medications the same...    See the new updated list>>  BP meds include:       Verapamil (Calan) 240mg  one tab daiy...       Valsartan (Diovan) 160mg  one tab daily...       Hydrochlorthiazide = HCTZ (Hydrodiuril) 25mg  one tab daily...  You are OFF the prev ToprolXL (Metoprolol) medication...  We will refer you for a hearing evaluation...  Use should ask DrPenumalli about combining the Aricept & Namenda into one pill for you...  Please return to our lab one morning this week for your FASTING blood work...    We will contact you w/ the results when available...   Call for any questions...  Let's plan a follow up visit in 42mo, sooner if needed for problems.Marland KitchenMarland Kitchen

## 2014-08-10 NOTE — Progress Notes (Signed)
Subjective:    Patient ID: Cynthia Mathis, female    DOB: Apr 03, 1933, 81 y.o.   MRN: [id number]  HPI 81 y/o WF here for a follow up visit... she has mult med problems as noted below ...   ~  sees DrPerry for GI- divertics, polyps, hems & a bout of diverticulitis in Mar08...  ~  sees Dr Risa Grill Urology w/ microscopic hematuria and a neg eval 5/08... 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 ~  sees DrLLomax for Derm w/ alopecia- offered Rogaine but decided against it... ~  Sees DrPenumali for Neuro w/ memory loss, early stage dementia...  ~  May 15, 2012:  3-70mo ROV & [name] has had a prob w/ her BP> when I last saw her she was on Verap240 + Lisinopril30 & BP was ~150/80 but she had a throat clearing tic & mild cough so we switched the ACE to Losartan100; side effect improved somewhat but BP was higher on her home checks & she wanted to go back on the Lisin; she saw TP & her labs showed K=6.0 so the ACE/ ARB was stopped in favor of HCTZ25mg /d; f/u labs showed K back to 5 but home BP checks were up somewhat, therefore ToprolXL25 added;  BP generally better on the combo Verap240, HCT25, MetopER25 & measured 140/80 at day-surg & at DrCornett's office recently & measures 126/72 here today (she says home readings are higher & she will bring cuff to f/u OVs)... She does not HR is slower & measures 50 at rest here today but she is asymptomatic- good energy, not dizzy, no CP/ palpit/ weakness/ SOB/ etc; she plays "pickle ball" regularly & some matches last 3H & she has no difficulty...  In the final analysis she decided to continue the same meds rather than change them further...    She had her RLQ Lipoma removed surgically as outpt by DrCornett 04/17/12, she is glad to be rid of it...    She also tells me that her daugh in [place] was Dx w/ Hemochromatosis; they didn't have any details but she apparently undergoing phlebotomy now; I explained that both she & her husb would have to  be carriers of the gene & we will check her Fe level & Hemochromatosis DNA test...   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  ~  September 17, 2012:  78mo ROV & Cynthia Mathis is concerned about her memory; husb has been under the care of DrLove for >1yrs w/ mild cognitive impairment, her MMSE is abn & we will refer to [facility] for further assessment & scans... We reviewed the following medical problems during today's office visit >>     Macular Degen> followed by DrHecker & DrRankin...    HBP> on ASA81, Metop25, Verap240, HCT25; BP= 140/80 & she denies HA, CP, palpit, dizzy, SOB, edema, etc...    Hyperlipid> on Simva20; last FLP 1/13 showed TChol 169, TG 118, HDL 56, LDL 89; she is not fasting today...    Hx Impaired FBS> Hx BS in the 130 range, but all readings ~100 x yrs now;  Labs 3/14 showed BS=106, A1c=6.2.Marland KitchenMarland Kitchen    Hypothy> hx multinod thyroid x yrs; had thyroidectomy 1990 by DrWeatherly; she weaned off Thyroid hormone yrs ago & has remained clinically & biochem euthyroid; TSH= 0.37    GI- Divertics, Polyps> hx diverticulitis in 2008- followed by DrPerry; last colonoscopy 4/13 showed severe diverticulosis w/o inflamm  or polyps seen...    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    DJD, Osteoporosis> on Ca, MVI, Glucosamine; she uses OTC analgesic meds as needed; see BMDs below- on Bisphos drug holiday since 7/11 & due for f/u BMD...    Memory Loss> she is c/o difficulty w/ name, dates, etc; MMSE is abnormal & we will refer to [facility]...    Anxiety> no requiring meds... We reviewed prob list, meds, xrays and labs> see below for updates >>   LABS 3/14:  Chems- wnl w/ BS=106, A1c=6.2;  TSH=0.37;  Sed=14...    ~  January 23, 2013:  84mo ROV & Cynthia Mathis notes that her memory is slipping some & her hearing is not as good;  She had f/u visit  w/ DrPenumalli 3/14> her MMSE was 29/30 & he planned to check MRI Brain & B12 level, he did not rec starting meds yet (husb takes Razadyne & feels it helps him); he does rec fresh fruits & vegetables + physical exercise...    BP is controlled on ToprolXL25, CalanSR240, & HCT25; BP= 142/78 & she denies HA, CP, palpit, SOB, edema...    Chol is controlled on Simva20 + Fish Oil; Last FLP was 1/13 as she cannot seem to remember to come fasting for blood work (see below)...    She remains on a bisphos drug holiday & needs f/u BMD soon to re-assess...  We reviewed prob list, meds, xrays and labs> see below for updates >>  ADDENDUM 04/15/13 >> pt called & wants off Simva20 after reading something about memory loss=> change to Atorva20 (she never did)...  ~  August 04, 2013:  9mo ROV & Cynthia Mathis states she's doing well- no new complaints or concerns...    Macular Degen> followed by DrHecker & DrRankin...    HBP> on ASA81, Metop25, Verap240, HCT25; BP= 140/82 & she denies HA, CP, palpit, dizzy, SOB, edema, etc; she is bradycardic & we decided to stop ToprolXL & replace it w/ [id number]...    Hyperlipid> on Simva20; last FLP 1/15 showed TChol 155, TG 122, HDL 50, LDL 81; she is not fasting today...    Hx Impaired FBS> Hx BS in the 130 range, but all readings ~100 x yrs now;  Labs 1/15 showed BS=103    Hypothy> hx multinod thyroid x yrs; had thyroidectomy 1990 by DrWeatherly; she weaned off Thyroid hormone yrs ago & has remained clinically & biochem euthyroid; TSH= 0.59    GI- Divertics, Polyps> hx diverticulitis in 2008- followed by DrPerry; last colonoscopy 4/13 showed severe diverticulosis w/o inflamm or polyps seen...    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 Hg= 14.9    DJD, Osteoporosis> on Ca, MVI, Glucosamine; she uses OTC analgesic meds as needed; see BMDs below- on Bisphos drug holiday since 7/11 & due for f/u  BMD...    Memory Loss> she is c/o difficulty w/ name, dates, etc; MMSE is abnormal & eval by [facility] 4/14, DrPenumalli; felt to have mild cognitive impairment vs normal aging, did not rec starting meds... Exam today w/ 0/3 recall after distraction, start Aricept trial.    Anxiety> no requiring meds... We reviewed prob list, meds, xrays and labs> see below for updates >> given TDAP today...  CXR 1/15 showed heart at upper lim of norm, lungs clear (mild dev trachea to right c/w enlarged  left lobe of thyroid- no change)...  EKG 1/15 showed SBrady, rate44, NSSTTW changes...   LABS 1/15:  FLP- at goals on Simva20;  Chems- wnl;  CBC- wnl;  TSH=0.59;  VitD=54... NOTE:  She wants to try ARICEPT10 start 1/2 => 1 tab daily as trial; Pulse is low at 48 & we decided to stop ToprolXL & replace it w/ VALSARTAN160/d...  ~  February 04, 2014:  49mo ROV & Cynthia Mathis has several minor somatic complaints but overall stable & feels well; our scale shows 13# wt loss to 132# today- she states good appetite, eating satis, "I get lots of exercise playng pickle ball" she says;  She notes that feet/ toes are cold (no pain, no sores), unusual feeling in legs at night, sweats alot, runny nose, etc;  She reallly dosen't want more meds but will try Astepro for the rhinorrhea...      Last OV we stopped her Metoprolol due to bradycardia & substituted Diovan160 for her BP> now on this +CalanSR240 & HCT25; BP= 120/60 & denies HA, CP, palpit, SOB, edema, etc...     She remains on Simva20 w/ good control of Lipids...    She remains on Aricept10 & feels that her memory is stable, denies deterioration...  We reviewed prob list, meds, xrays and labs> see below for updates >> MEDS refuilled per request...  LABS, Mammogram, CXR, BMD, EKG> all reviewed ...   ~  August 10, 2014:  28mo ROV & it took quite a bit of time getting her med list straight today- she did not know her meds, her card w/ meds written on it was old & out of date,  Epic meds were not up to date either & we called Pharm- pt is reminded to bring all med bottles to every office visit... She tells me that [facility] wants her to get a hearing test & we will refer to ENT office audiologist... She has mult minor somatic complaints- runny nose, some numbness intermit in legs/ twitching, pain in neck muscles, stools are very soft, some urgency; we discussed antihist prn, apply heat to muscles, incr fiber, consider Neuro eval for neuropathy=> no new meds at this point...    Macular Degen> followed by DrHecker & DrRankin; she is getting shot Qmo or so for the MD & sched for bilat cat surg soon...    HBP> on ASA81, Valsartan160, Verap240, HCT25; BP= 128/76 & she denies HA, CP, palpit, dizzy, SOB, edema, etc...    Hyperlipid> on Simva20; FLP 2/16 show TChol 161, TG 107, HDL 61, LDL 79; continue same meds...    Hx Impaired FBS> Hx BS in the 130 range prev, but all readings ~100 x yrs now;  Labs 2/16 showed BS=99    Hypothy> hx multinod thyroid x yrs; had thyroidectomy 1990 by DrWeatherly; she weaned off Thyroid hormone yrs ago & has remained clinically & biochem euthyroid; Labs 2/16 showed TSH= 0.76    GI- Divertics, Polyps> hx diverticulitis in 2008- followed by DrPerry; last colonoscopy 4/13 showed severe diverticulosis w/o inflamm or polyps seen...    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 Labs 1/15 showed Hg= 14.7 & LFTs are WNL.    DJD, Osteoporosis> on Ca, MVI, Glucosamine; she uses OTC analgesic meds as needed; see BMDs below- on Bisphos drug holiday since 7/11 & due for f/u BMD...    Memory Loss> eval by [facility] 4/14, DrPenumalli; felt to have mild  cognitive impairment vs normal aging, then w/ progressive impairment & they have her on Donepezil10 & Namenda10Bid now... We reviewed prob list, meds, xrays and labs> see below for updates >>   LABS 2/16:  FLP- at goals on Simva20;   Chems- wnl;  CBC- wnl;  TSH=0.76...           Problem List:  MACULAR DEGENERATION (ICD-362.50) - eval by DrHecker w/ foveal telangiectasia, macular drusen, macular degeneration, and cataracts- being followed regularly... vision is preserved so far... ~  11/11:  f/u DrHecker- stable age related mac degen, no retinopathy. ~  11/12:  f/u DrHecker- has advanced bilat AMD, sent to [facility], we don't have his notes... ~  She has been getting shots in the eyes for macular degen, we do not have notes from [facility]... ~  2/16: she tells me that she continues to receive the shots; she is also sched for bilat cat surg by DrHecker...  HYPERTENSION (ICD-401.9) >>  ~  on VERAPAMIL 240mg /d,  TOPROL-XL 25mg /d,  HCTZ 25mg /d... Prev on Lisinopril but this was stopped due to throat clearing tic & mild cough, plus K=6.0 at the time (all improved off the ACE)... ~  8/10: borderline BP here and she will monitor at home carefully & call if not <150/90. ~  7/11: she reports that she incr the Lisinopril to 1&1/2 tabs due to BP~150+, now better... ~  7/12:  BP stable on Verap240 & Lisinopril 30mg /d> BP= 140/68 today> denies HA, fatigue, visual changes, CP, palipit, dizziness, syncope, dyspnea, edema, etc... ~  1/13:  BP= 160/90 (didn't take med today); states BP are ok at home; continue same meds + low sodium etc... ~  CXR 1/13 showed norm heart size, clear lungs, mild thor spondylosis, NAD.[name]. ~  7/13:  BP= 152/82 & she notes some troublesome throat clearing & mild cough; we decided to STOP the ACE & switch to LOSARTAN 100mg /d, monitor her symptoms and her blood presure... ~  CXR 9/13 showed calcif Ao, clear lungs, suspect left thyroid enlargement, DJD in Tspine... ~  EKG 10/13 showed SBrady, rate58, wnl, NAD... ~  11/13: she had long convoluted hx of BP med changes- see above- now on Verap240, MetopER25, HCT25, & BP= 126/72; we discussed options but she is content to continue these same meds for now & watch BP... ~   3/14: on ASA81, Metop25, Verap240, HCT25; BP= 140/80 & she denies HA, CP, palpit, dizzy, SOB, edema, etc. ~  1/15: on ASA81, Metop25, Verap240, HCT25; BP= 140/82 & she remains essen asymptomatic; she is bradycardic & we decided to stop ToprolXL & replace it w/ [id number] ~  CXR 1/15 showed heart at upper lim of norm, mild dev of trach to right, clear lungs, NAD...  ~  EKG 1/15 showed SBrady, rate44, NSSTTWA, NAD.[name]. ~  7/15: on ASA81, Diovan160, Verap240, HCT25; BP= 120/60 & she is essentially asymptomatic, pulse=60, continue same meds... ~  2/16: on same meds & BP= 128/76, she remains asymptomatic...  DYSLIPIDEMIA (ICD-272.4) - on ZOCOR 20mg /d... plus doing well on diet... ~  [place] 6/09 showed TChol 144, TG 140, HDL 39, LDL 77... continue same. ~  FLP 2/10 showed TChol 164, TG 77, HDL 54, LDL 95 ~  FLP 8/10 showed TChol 138, TG 51, HDL 48, LDL 80 ~  FLP 1/11 showed TChol 155, TG 100, HDL 48, LDL 87 ~  FLP 7/11 showed TChol 158, TG 74, HDL 55, LDL 89 ~  FLP 1/12 showed TChol 155, TG 101, HDL 50,  LDL 85 ~  FLP 1/13 on Simva20 showed TChol 169, TG 118, HDL 56, LDL 89  ~  FLP 1/15 on Simva20 showed TChol 155, TG 122, HDL 50, LDL 81 ~  FLP 2/16 on Simva20 showed TChol 161, TG 107, HDL 61, LDL 79; continue same meds.  Hx Impaired Fasting Glucose >> on diet alone... FBS @ home betw 120-130 recently... she is on a good diet and weight is down at 139# today (63"  BMI= 24)... ~  labs 3/09 showed FBS= 119... subseq HgA1c 5/09 was 6.4... diet Rx. ~  labs 8/09 showed BS= 106, HgA1c= 5.9.Marland KitchenMarland Kitchen rec- same. ~  labs 2/10 showed BS= 102, A1c= 5.9 ~  labs 1/11 showed BS= 98 ~  labs 1/12 showed BS= 95 ~  Labs 1/13 showed BS= 101 ~  Labs 3/14 showed BS=106, A1c=6.2 ~  Labs 1/15 showed BS= 103 ~  Labs 2/16 showed FBS= 99  HYPOTHYROIDISM (ICD-244.9) - off Synthroid now...  clinically & biochemically euthyroid... remote hx of overactive thyroid many yrs ago in [place] (we don't have records)... she had an eval by DrKohut  in 1987 w/ familial multinodular goiter w/ neg FNA... started on Rx but goiter enlarged- had thyroidectomy 1990 by DrWeatherly... ~  labs 3/09 showed TSH = 0.35 (0.35-5.5)... we have slowly decr her dose to 19mcg/d... ~  labs 8/09 showed TSH= 0.33... rec- decr the Synth50 to 1/2 tab daily. ~  labs 2/10 showed =TSH= 0.54 and Synthroid stopped... ~  labs 4/10 off med showed TSH= 0.59 ~  labs 8/10 showed TSH= 0.60 ~  labs 1/11 showed TSH= 0.60 ~  labs 7/11 showed TSH= 0.56 ~  labs 1/12 showed TSH= 0.60 ~  Labs 1/13 showed TSH= 0.58 ~  Labs 3/14 showed TSH= 0.37 ~  Labs 1/15 showed TSH= 0.59 ~  Labs 2/16 showed TSH= 0.76  DIVERTICULOSIS OF COLON (ICD-562.10) COLONIC POLYPS (ICD-211.3) - followed by DrPerry... bout of diverticulitis in Mar08... ~  last colonoscopy 3/08 showed divertics, 53mm polyp, hems... path=hyperplastic, f/u planned 62yrs. ~  F/u colonoscopy 4/13 by DrPerry showed severe diverticulosis w/o inflamm and w/o polyps etc...  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  HEMATURIA, HX OF (ICD-V13.09) - eval 4/08 by DrGrapey w/ neg Sonar, Cysto, cytology...  GYN >> DrKRichardson, Mammograms at [facility] (NEG- 4/15 study)...  DEGENERATIVE JOINT DISEASE (ICD-715.90) - c/o discomfort in her shoulders and knees... uses OTC meds as needed.  Hx of OSTEOPOROSIS (ICD-733.00) - on Fosamax 70/wk thru 7/11, Caltrate 1/d, MVI, Vit D 1000/d... ~  initial BMD 10/01 showed TScores -0.2  to -3.2 in the spine...  ~  f/u studies w/ steady improvement on Fosamax therapy... ~  BMD 3/09 w/ TScores -0.1 to -1.7 in the spine... continue Rx. ~  BMD 7/11 showed TScores -2.0 Spine, and -0.3 in right FemNeck... we opted for Bisphos drug holiday to start now. ~  Labs 1/13 showed Vit D level = 48, continue  same... ~  BMD 2/15 showed WNL Tscores; rec to continue calcium, MVI, VitD, wt bearing exercise...  PROGRESSIVE MEMORY LOSS >>  ~  MMSE 5/09 by Doran Heater, NP was 29/30... Reassured. ~  3/14: she presented w/ c/o worsening memory & MMSE has diminished- refer to [facility] for eval & rx... ~  Neuro eval 4/14 by DrPenumalli> mild  cognitive impairment vs norm aging, he did not rec starting meds... ~  CT Brain 4/14 showed mild atrophy & sm vessel dis ~  She continues to f/u w/ Neurology periodically... ~  1/15: ROV here & husb more concerned re her memory, recall was 0/3 after distraction; husb has apparently done well on Aricept & she wants trial, start ARICEPT5=>10 daily... ~  She has been followed by DrPenumalli & they added Namenda 10Bid to her Aricept10...  ANXIETY (ICD-300.00) - prev on Alpraz Prn but hasn't used in yrs...  DERMATOLOGY >> LIPOMA (ICD-214.9) - notes large lipoma in RLQ/ rt flank area... some discomfort noted... seen by DrLeone in the past w/o surg... she saw DrCornett, CCS, 1/11 for second opinion & she will decide re: excision... ~  5/13:  She had Basal cell ca removed from the left side of her nose, w/ subseq deeper margins & reports everything is OK... ~  7/13:  She is concerned that the lipoma may be growing & she requests f/u DrCornett for excision... ~  10/13:  Lipoma removed by DrCornett in outpt surg 04/17/12...  SHINGLES - remote hx of right T8-9 shingles in 1986...   Past Surgical History  Procedure Laterality Date  . Thyroidectomy  1990    [name]  . Appendectomy  1947  . Colonoscopy    . Mass excision  04/17/2012    Procedure: EXCISION MASS;  Surgeon: [name], MD;  Location: [facility];  Service: General;  Laterality: N/A;    Outpatient Encounter Prescriptions as of 08/10/2014  Medication Sig  . aspirin 81 MG tablet Take 81 mg by mouth daily.  . Calcium Carbonate-Vitamin D (CALCIUM 600+D) 600-400 MG-UNIT per tablet Take 1  tablet by mouth daily.  . Cyanocobalamin (VITAMIN B 12 PO) Take 1 tablet by mouth daily.  . fish oil-omega-3 fatty acids 1000 MG capsule Take 1 g by mouth daily.  [name] glucosamine-chondroitin 500-400 MG tablet Take 1 tablet by mouth 3 (three) times daily.  . hydrochlorothiazide (HYDRODIURIL) 25 MG tablet Take 1 tablet (25 mg total) by mouth daily.  . memantine (NAMENDA) 10 MG tablet Take 10 mg by mouth daily.  . memantine (NAMENDA) 5 MG tablet Take 1 tablet (5 mg total) by mouth 2 (two) times daily. Take 5mg  twice a day for 1 month, then increase to 10mg  twice a day.  . metoprolol succinate (TOPROL-XL) 25 MG 24 hr tablet Take 25 mg by mouth daily.  . Multiple Vitamins-Minerals (MULTIVITAMIN & MINERAL PO) Take 1 tablet by mouth daily.  . simvastatin (ZOCOR) 20 MG tablet TAKE 1 TABLET (20 MG TOTAL) BY MOUTH AT BEDTIME.  . verapamil (CALAN-SR) 240 MG CR tablet TAKE 1 TABLET (240 MG TOTAL)  BY MOUTH AT BEDTIME.  . [DISCONTINUED] donepezil (ARICEPT) 10 MG tablet Take 1/2 tablet by mouth daily x 1 month, then 1 daily thereafter. (Patient not taking: Reported on 08/10/2014)  . [DISCONTINUED] memantine (NAMENDA) 10 MG tablet Take 1 tablet (10 mg total) by mouth 2 (two) times daily. (Patient not taking: Reported on 08/10/2014)    Allergies  Allergen Reactions  . Epinephrine     REACTION: heart racing    Current Medications, Allergies, Past Medical History, Past Surgical History, Family History, and Social History were reviewed in [employer] record.    Review of Systems        See HPI - all other systems neg except as noted...  The patient complains of dyspnea on exertion.  The patient denies anorexia, fever, weight loss, weight gain, vision loss, decreased hearing, hoarseness, chest pain, syncope, peripheral edema, prolonged cough, headaches, hemoptysis, abdominal pain, melena, hematochezia, severe indigestion/heartburn, hematuria, incontinence, muscle weakness, suspicious skin  lesions, transient blindness, difficulty walking, depression, unusual weight change, abnormal bleeding, enlarged lymph nodes, and angioedema.   Objective:   Physical Exam    WD, WN, 81 y/o WF in NAD... VITAL SIGNS:  Reviewed... GENERAL:  Alert & oriented; pleasant & cooperative... HEENT:  [place]/AT, EOM-wnl, PERRLA, EACs-clear, TMs-wnl, NOSE-clear, THROAT-clear & wnl. NECK:  Supple w/ fair ROM; no JVD; normal carotid impulses w/o bruits; scar of prev thyroid surg- no nodules or goiter; no lymphadenopathy. CHEST:  Clear to P & A; without wheezes/ rales/ or rhonchi. HEART:  Regular Rhythm; without murmurs/ rubs/ or gallops. ABDOMEN:  Soft & nontender; normal bowel sounds; no organomegaly or masses detected. EXT: without deformities, mild arthritic changes; no varicose veins/ venous insuffic/ or edema. prob heel spurs w/ some plantar fascia pain> advised soaks & stretching... NEURO:  CN's intact;  no focal neuro deficits... DERM:  Scar in right flank after lipoma excision... no other lesions noted...  MMSE 09/17/12>  She did not know the Pres ("I can see him, a black guy"), VP, or Gov;  Didn't know prev pres etc;  Recall 3/3 immed, but only 1/3 after distraction;  Serial 7s & WORLD backwards were fluent; proverbs were abstracted normally...  RADIOLOGY DATA:  Reviewed in the EPIC EMR & discussed w/ the patient...  LABORATORY DATA:  Reviewed in the EPIC EMR & discussed w/ the patient...   Assessment & Plan:    Macular Degen>  Followed by DrHecker/ Rankin & getting MD shots & sched for cat surg...   HBP>  Now on Verap, Diovan, HCT- along w/ no salt; & BP appears to be well controlled & pulse improved (60/min)  DYSLIPIDEMIA>  Stable on diet & Simva20...  Borderline DM>  Hx BS in the 120-130 range yrs ago but stable ~100 for the last 3+yrs on diet alone...  Hx Hypothyroidism>  She weaned off Synthroid several yrs ago & TSH has remained normal, clinically euthyroid as well...  GI>   Divertics, Colon Polyps>  Stable & up to date w/ f/u colon 4/13 from DrPerry==> divertics, no polyps...  LIPOMA>  Removed 10/13 from RLQ area by DrCornett...  DJD>  She notes some right knee discomfort playing "pickle ball" & is advised to wear knee brace, take Aleve prn (she declines further eval or prescription meds)...  Osteoporosis>  Hx BMD in 2001 w/ TScore =3.2 in Spine; Rx'd Fosamax for 59yrs w/ steady improvement & on Bisphos drug holiday since 7/11 & BMD is 2015 was WNL- continue supplements w/ Calcium, MVI, Vit D...  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  MEMORY LOSS>  Followed by DrPenumalli on Aricept & Namenda...  Other medical problems as listed...   Patient's Medications  New Prescriptions   No medications on file  Previous Medications   ASPIRIN 81 MG TABLET    Take 81 mg by mouth daily.   CALCIUM CARBONATE-VITAMIN D (CALCIUM 600+D) 600-400 MG-UNIT PER TABLET    Take 1 tablet by mouth daily.   CYANOCOBALAMIN (VITAMIN B 12 PO)    Take 1 tablet by mouth daily.   DONEPEZIL (ARICEPT) 10 MG TABLET    Take 10 mg by mouth at bedtime.   FISH OIL-OMEGA-3 FATTY ACIDS 1000 MG CAPSULE  Take 1 g by mouth daily.   GLUCOSAMINE-CHONDROITIN 500-400 MG TABLET    Take 1 tablet by mouth 3 (three) times daily.   HYDROCHLOROTHIAZIDE (HYDRODIURIL) 25 MG TABLET    Take 1 tablet (25 mg total) by mouth daily.   MEMANTINE (NAMENDA) 10 MG TABLET    Take 10 mg by mouth 2 (two) times daily.    MULTIPLE VITAMINS-MINERALS (MULTIVITAMIN & MINERAL PO)    Take 1 tablet by mouth daily.   SIMVASTATIN (ZOCOR) 20 MG TABLET    TAKE 1 TABLET (20 MG TOTAL) BY MOUTH AT BEDTIME.   VALSARTAN (DIOVAN) 160 MG TABLET    Take 160 mg by mouth daily.   VERAPAMIL (CALAN-SR) 240 MG CR TABLET    TAKE 1 TABLET (240 MG TOTAL)  BY MOUTH AT BEDTIME.  Modified Medications   No medications on file  Discontinued Medications    DONEPEZIL (ARICEPT) 10 MG TABLET    Take 1/2 tablet by mouth daily x 1 month, then 1 daily thereafter.   MEMANTINE (NAMENDA) 10 MG TABLET    Take 1 tablet (10 mg total) by mouth 2 (two) times daily.   MEMANTINE (NAMENDA) 5 MG TABLET    Take 1 tablet (5 mg total) by mouth 2 (two) times daily. Take 5mg  twice a day for 1 month, then increase to 10mg  twice a day.   METOPROLOL SUCCINATE (TOPROL-XL) 25 MG 24 HR TABLET    Take 25 mg by mouth daily.

## 2014-08-11 ENCOUNTER — Other Ambulatory Visit (INDEPENDENT_AMBULATORY_CARE_PROVIDER_SITE_OTHER): Payer: Medicare Other

## 2014-08-11 ENCOUNTER — Telehealth: Payer: Self-pay | Admitting: Pulmonary Disease

## 2014-08-11 DIAGNOSIS — F419 Anxiety disorder, unspecified: Secondary | ICD-10-CM | POA: Diagnosis not present

## 2014-08-11 DIAGNOSIS — D126 Benign neoplasm of colon, unspecified: Secondary | ICD-10-CM

## 2014-08-11 DIAGNOSIS — I10 Essential (primary) hypertension: Secondary | ICD-10-CM

## 2014-08-11 DIAGNOSIS — E785 Hyperlipidemia, unspecified: Secondary | ICD-10-CM

## 2014-08-11 LAB — CBC WITH DIFFERENTIAL/PLATELET
Basophils Absolute: 0.1 10*3/uL (ref 0.0–0.1)
Basophils Relative: 0.8 % (ref 0.0–3.0)
EOS PCT: 2.5 % (ref 0.0–5.0)
Eosinophils Absolute: 0.2 10*3/uL (ref 0.0–0.7)
HCT: 43.8 % (ref 36.0–46.0)
HEMOGLOBIN: 14.7 g/dL (ref 12.0–15.0)
Lymphocytes Relative: 33.9 % (ref 12.0–46.0)
Lymphs Abs: 3.1 10*3/uL (ref 0.7–4.0)
MCHC: 33.7 g/dL (ref 30.0–36.0)
MCV: 90.1 fl (ref 78.0–100.0)
MONOS PCT: 7.7 % (ref 3.0–12.0)
Monocytes Absolute: 0.7 10*3/uL (ref 0.1–1.0)
NEUTROS PCT: 55.1 % (ref 43.0–77.0)
Neutro Abs: 5 10*3/uL (ref 1.4–7.7)
Platelets: 235 10*3/uL (ref 150.0–400.0)
RBC: 4.86 Mil/uL (ref 3.87–5.11)
RDW: 13.5 % (ref 11.5–15.5)
WBC: 9.2 10*3/uL (ref 4.0–10.5)

## 2014-08-11 LAB — LIPID PANEL
Cholesterol: 161 mg/dL (ref 0–200)
HDL: 60.7 mg/dL (ref >39.00)
LDL Cholesterol: 79 mg/dL (ref 0–99)
NonHDL: 100.3
Total CHOL/HDL Ratio: 3
Triglycerides: 107 mg/dL (ref 0.0–149.0)
VLDL: 21.4 mg/dL (ref 0.0–40.0)

## 2014-08-11 LAB — HEPATIC FUNCTION PANEL
ALT: 14 U/L (ref 0–35)
AST: 19 U/L (ref 0–37)
Albumin: 4.2 g/dL (ref 3.5–5.2)
Alkaline Phosphatase: 53 U/L (ref 39–117)
Bilirubin, Direct: 0.1 mg/dL (ref 0.0–0.3)
Total Bilirubin: 0.7 mg/dL (ref 0.2–1.2)
Total Protein: 7 g/dL (ref 6.0–8.3)

## 2014-08-11 LAB — TSH: TSH: 0.76 u[IU]/mL (ref 0.35–4.50)

## 2014-08-11 LAB — BASIC METABOLIC PANEL
BUN: 26 mg/dL — AB (ref 6–23)
CHLORIDE: 102 meq/L (ref 96–112)
CO2: 31 meq/L (ref 19–32)
Calcium: 9.4 mg/dL (ref 8.4–10.5)
Creatinine, Ser: 0.93 mg/dL (ref 0.40–1.20)
GFR: 61.68 mL/min (ref >60.00)
Glucose, Bld: 99 mg/dL (ref 70–99)
POTASSIUM: 4.5 meq/L (ref 3.5–5.1)
Sodium: 139 mEq/L (ref 135–145)

## 2014-08-11 NOTE — Telephone Encounter (Signed)
Pt stopped by and brought an updated medication list for Dr Lenna Gilford to review. Pt would like phone call to ensure that list is correct and that she is taking everything needed and correctly.  Once Dr Lenna Gilford gives okay on mediations, the medication list in chart will need to be update. Please advise Dr Lenna Gilford. Thanks.

## 2014-08-11 NOTE — Telephone Encounter (Signed)
LMTC x 1 for pt. Per SN:  The med list that she brought by was perfect and we have made the changes to our list to match her list.

## 2014-08-11 NOTE — Telephone Encounter (Signed)
Patient calling back, informed her that we have update our list to match list she brought in today. She states she appreciates [place] doing so.

## 2014-08-12 DIAGNOSIS — H903 Sensorineural hearing loss, bilateral: Secondary | ICD-10-CM | POA: Diagnosis not present

## 2014-08-12 DIAGNOSIS — H93293 Other abnormal auditory perceptions, bilateral: Secondary | ICD-10-CM | POA: Diagnosis not present

## 2014-08-24 DIAGNOSIS — H16001 Unspecified corneal ulcer, right eye: Secondary | ICD-10-CM | POA: Diagnosis not present

## 2014-08-26 DIAGNOSIS — H16001 Unspecified corneal ulcer, right eye: Secondary | ICD-10-CM | POA: Diagnosis not present

## 2014-08-28 DIAGNOSIS — H16001 Unspecified corneal ulcer, right eye: Secondary | ICD-10-CM | POA: Diagnosis not present

## 2014-08-31 DIAGNOSIS — H16001 Unspecified corneal ulcer, right eye: Secondary | ICD-10-CM | POA: Diagnosis not present

## 2014-09-17 DIAGNOSIS — H25013 Cortical age-related cataract, bilateral: Secondary | ICD-10-CM | POA: Diagnosis not present

## 2014-09-17 DIAGNOSIS — H2513 Age-related nuclear cataract, bilateral: Secondary | ICD-10-CM | POA: Diagnosis not present

## 2014-09-17 DIAGNOSIS — H16001 Unspecified corneal ulcer, right eye: Secondary | ICD-10-CM | POA: Diagnosis not present

## 2014-09-21 DIAGNOSIS — H3532 Exudative age-related macular degeneration: Secondary | ICD-10-CM | POA: Diagnosis not present

## 2014-09-29 DIAGNOSIS — H2512 Age-related nuclear cataract, left eye: Secondary | ICD-10-CM | POA: Diagnosis not present

## 2014-10-01 ENCOUNTER — Emergency Department (HOSPITAL_COMMUNITY)
Admission: EM | Admit: 2014-10-01 | Discharge: 2014-10-01 | Disposition: A | Discharge status: Home / Self Care | Payer: Medicare Other | Attending: Emergency Medicine | Admitting: Emergency Medicine

## 2014-10-01 ENCOUNTER — Encounter (HOSPITAL_COMMUNITY): Payer: Self-pay

## 2014-10-01 ENCOUNTER — Emergency Department (HOSPITAL_COMMUNITY): Payer: Medicare Other

## 2014-10-01 DIAGNOSIS — I10 Essential (primary) hypertension: Secondary | ICD-10-CM | POA: Diagnosis not present

## 2014-10-01 DIAGNOSIS — Z8669 Personal history of other diseases of the nervous system and sense organs: Secondary | ICD-10-CM | POA: Insufficient documentation

## 2014-10-01 DIAGNOSIS — Z86010 Personal history of colonic polyps: Secondary | ICD-10-CM | POA: Diagnosis not present

## 2014-10-01 DIAGNOSIS — Z87448 Personal history of other diseases of urinary system: Secondary | ICD-10-CM | POA: Insufficient documentation

## 2014-10-01 DIAGNOSIS — Z8659 Personal history of other mental and behavioral disorders: Secondary | ICD-10-CM | POA: Insufficient documentation

## 2014-10-01 DIAGNOSIS — Z86018 Personal history of other benign neoplasm: Secondary | ICD-10-CM | POA: Diagnosis not present

## 2014-10-01 DIAGNOSIS — K5900 Constipation, unspecified: Secondary | ICD-10-CM | POA: Diagnosis present

## 2014-10-01 DIAGNOSIS — K6289 Other specified diseases of anus and rectum: Secondary | ICD-10-CM

## 2014-10-01 DIAGNOSIS — K868 Other specified diseases of pancreas: Secondary | ICD-10-CM | POA: Diagnosis not present

## 2014-10-01 DIAGNOSIS — K8020 Calculus of gallbladder without cholecystitis without obstruction: Secondary | ICD-10-CM | POA: Diagnosis not present

## 2014-10-01 DIAGNOSIS — Z7982 Long term (current) use of aspirin: Secondary | ICD-10-CM | POA: Diagnosis not present

## 2014-10-01 DIAGNOSIS — R030 Elevated blood-pressure reading, without diagnosis of hypertension: Secondary | ICD-10-CM | POA: Diagnosis not present

## 2014-10-01 DIAGNOSIS — E785 Hyperlipidemia, unspecified: Secondary | ICD-10-CM | POA: Diagnosis not present

## 2014-10-01 DIAGNOSIS — K5730 Diverticulosis of large intestine without perforation or abscess without bleeding: Secondary | ICD-10-CM | POA: Diagnosis not present

## 2014-10-01 DIAGNOSIS — Z79899 Other long term (current) drug therapy: Secondary | ICD-10-CM | POA: Insufficient documentation

## 2014-10-01 DIAGNOSIS — M1990 Unspecified osteoarthritis, unspecified site: Secondary | ICD-10-CM | POA: Diagnosis not present

## 2014-10-01 LAB — CBC WITH DIFFERENTIAL/PLATELET
BASOS ABS: 0 10*3/uL (ref 0.0–0.1)
Basophils Relative: 0 % (ref 0–1)
Eosinophils Absolute: 0 10*3/uL (ref 0.0–0.7)
Eosinophils Relative: 0 % (ref 0–5)
HCT: 44.7 % (ref 36.0–46.0)
Hemoglobin: 14.8 g/dL (ref 12.0–15.0)
LYMPHS ABS: 1.6 10*3/uL (ref 0.7–4.0)
LYMPHS PCT: 7 % — AB (ref 12–46)
MCH: 30.4 pg (ref 26.0–34.0)
MCHC: 33.1 g/dL (ref 30.0–36.0)
MCV: 91.8 fL (ref 78.0–100.0)
MONO ABS: 0.9 10*3/uL (ref 0.1–1.0)
MONOS PCT: 4 % (ref 3–12)
Neutro Abs: 20 10*3/uL — ABNORMAL HIGH (ref 1.7–7.7)
Neutrophils Relative %: 89 % — ABNORMAL HIGH (ref 43–77)
PLATELETS: 199 10*3/uL (ref 150–400)
RBC: 4.87 MIL/uL (ref 3.87–5.11)
RDW: 13.3 % (ref 11.5–15.5)
WBC: 22.6 10*3/uL — ABNORMAL HIGH (ref 4.0–10.5)

## 2014-10-01 LAB — I-STAT CHEM 8, ED
BUN: 21 mg/dL (ref 6–23)
Calcium, Ion: 1.07 mmol/L — ABNORMAL LOW (ref 1.13–1.30)
Chloride: 101 mmol/L (ref 96–112)
Creatinine, Ser: 0.9 mg/dL (ref 0.50–1.10)
Glucose, Bld: 104 mg/dL — ABNORMAL HIGH (ref 70–99)
HEMATOCRIT: 49 % — AB (ref 36.0–46.0)
HEMOGLOBIN: 16.7 g/dL — AB (ref 12.0–15.0)
POTASSIUM: 3.6 mmol/L (ref 3.5–5.1)
SODIUM: 140 mmol/L (ref 135–145)
TCO2: 23 mmol/L (ref 0–100)

## 2014-10-01 MED ORDER — IOHEXOL 300 MG/ML  SOLN
100.0000 mL | Freq: Once | INTRAMUSCULAR | Status: AC | PRN
  Administered 2014-10-01: 100 mL via INTRAVENOUS

## 2014-10-01 MED ORDER — DOCUSATE SODIUM 100 MG PO CAPS: 100.0000 mg | ORAL_CAPSULE | Freq: Two times a day (BID) | ORAL | Status: DC

## 2014-10-01 MED ORDER — METRONIDAZOLE 500 MG PO TABS: 500.0000 mg | ORAL_TABLET | Freq: Two times a day (BID) | ORAL | Status: DC

## 2014-10-01 MED ORDER — IOHEXOL 300 MG/ML  SOLN
50.0000 mL | Freq: Once | INTRAMUSCULAR | Status: AC | PRN
  Administered 2014-10-01: 50 mL via ORAL

## 2014-10-01 MED ORDER — CIPROFLOXACIN HCL 500 MG PO TABS: 500.0000 mg | ORAL_TABLET | Freq: Two times a day (BID) | ORAL | Status: DC

## 2014-10-01 NOTE — ED Provider Notes (Signed)
CSN: [id number]     Arrival date & time 10/01/14  1829 History   First MD Initiated Contact with Patient 10/01/14 [phone]     Chief Complaint  Patient presents with  . Constipation      HPI Patient reports constipation of the past 2-3 days.  She's never really struggled with constipation before.  She states she is still able to move small amounts of stool out but feels like she names to have a larger bowel movement.  She is unable to do this today and that she came to the ER for evaluation.  She denies weight changes.  She denies fever chills.  No urinary complaints.  She's had a colonoscopy within the past 7 years.  She states she's never had abnormalities on her colonoscopy.  She denies significant rectal pain.  She denies melena or hematochezia.  Symptoms are mild to moderate in severity.  Nothing worsens or improves her symptoms.  She is not on stool softeners.   Past Medical History  Diagnosis Date  . Macular degeneration   . Hypertension   . Dyslipidemia   . Borderline diabetes mellitus   . Hypothyroid   . Diverticulosis of colon   . Hx of colonic polyps   . History of hematuria   . DJD (degenerative joint disease)   . History of osteoporosis   . Memory loss   . Anxiety   . Lipoma   . Hyperlipidemia    Past Surgical History  Procedure Laterality Date  . Thyroidectomy  1990    [name]  . Appendectomy  1947  . Colonoscopy    . Mass excision  04/17/2012    Procedure: EXCISION MASS;  Surgeon: [name], MD;  Location: [facility];  Service: General;  Laterality: N/A;   Family History  Problem Relation Age of Onset  . Colon cancer Neg Hx   . Stomach cancer Neg Hx   . Stroke Mother    History  Substance Use Topics  . Smoking status: Never Smoker   . Smokeless tobacco: Never Used  . Alcohol Use: 0.0 oz/week    0 Standard drinks or equivalent per week     Comment: occasionally   OB History    No data available     Review of Systems  All  other systems reviewed and are negative.     Allergies  Epinephrine  Home Medications   Prior to Admission medications   Medication Sig Start Date End Date Taking? Authorizing Provider  aspirin 81 MG tablet Take 81 mg by mouth daily.   Yes Historical Provider, MD  Calcium Carbonate-Vitamin D (CALCIUM 600+D) 600-400 MG-UNIT per tablet Take 1 tablet by mouth daily.   Yes Historical Provider, MD  Cyanocobalamin (VITAMIN B 12 PO) Take 1 tablet by mouth daily.   Yes Historical Provider, MD  donepezil (ARICEPT) 10 MG tablet Take 10 mg by mouth at bedtime.   Yes Historical Provider, MD  fish oil-omega-3 fatty acids 1000 MG capsule Take 1 g by mouth daily.   Yes Historical Provider, MD  glucosamine-chondroitin 500-400 MG tablet Take 1 tablet by mouth 3 (three) times daily.   Yes Historical Provider, MD  hydrochlorothiazide (HYDRODIURIL) 25 MG tablet Take 1 tablet (25 mg total) by mouth daily. 02/04/14 02/16/15 Yes Noralee Space, MD  memantine (NAMENDA) 10 MG tablet Take 10 mg by mouth 2 (two) times daily.    Yes Historical Provider, MD  Multiple Vitamins-Minerals (MULTIVITAMIN & MINERAL PO) Take  1 tablet by mouth daily.   Yes Historical Provider, MD  simvastatin (ZOCOR) 20 MG tablet TAKE 1 TABLET (20 MG TOTAL) BY MOUTH AT BEDTIME. 02/04/14  Yes Noralee Space, MD  valsartan (DIOVAN) 160 MG tablet Take 160 mg by mouth daily.   Yes Historical Provider, MD  verapamil (CALAN-SR) 240 MG CR tablet TAKE 1 TABLET (240 MG TOTAL)  BY MOUTH AT BEDTIME. 02/04/14  Yes Noralee Space, MD  ciprofloxacin (CIPRO) 500 MG tablet Take 1 tablet (500 mg total) by mouth every 12 (twelve) hours. 10/01/14   Jola Schmidt, MD  docusate sodium (COLACE) 100 MG capsule Take 1 capsule (100 mg total) by mouth every 12 (twelve) hours. 10/01/14   Jola Schmidt, MD  metroNIDAZOLE (FLAGYL) 500 MG tablet Take 1 tablet (500 mg total) by mouth 2 (two) times daily. 10/01/14   Jola Schmidt, MD   BP 149/59 mmHg  Pulse 60  Temp(Src) 97.4 F (36.3  C) (Oral)  Resp 16  SpO2 99% Physical Exam  Constitutional: She is oriented to person, place, and time. She appears well-developed and well-nourished. No distress.  HENT:  Head: Normocephalic and atraumatic.  Eyes: EOM are normal.  Neck: Normal range of motion.  Cardiovascular: Normal rate, regular rhythm and normal heart sounds.   Pulmonary/Chest: Effort normal and breath sounds normal.  Abdominal: Soft. She exhibits no distension. There is no tenderness.  Genitourinary:  No gross blood on rectal examination.  No significant stool encountered.  Rectal mucosal abnormality felt with digital exam.  Musculoskeletal: Normal range of motion.  Neurological: She is alert and oriented to person, place, and time.  Skin: Skin is warm and dry.  Psychiatric: She has a normal mood and affect. Judgment normal.  Nursing note and vitals reviewed.   ED Course  Procedures (including critical care time) Labs Review Labs Reviewed  CBC WITH DIFFERENTIAL/PLATELET - Abnormal; Notable for the following:    WBC 22.6 (*)    Neutrophils Relative % 89 (*)    Neutro Abs 20.0 (*)    Lymphocytes Relative 7 (*)    All other components within normal limits  I-STAT CHEM 8, ED - Abnormal; Notable for the following:    Glucose, Bld 104 (*)    Calcium, Ion 1.07 (*)    Hemoglobin 16.7 (*)    HCT 49.0 (*)    All other components within normal limits    Imaging Review Ct Abdomen Pelvis W Contrast  10/01/2014   CLINICAL DATA:  81 year old female with abdominal pain and abnormal rectal exam. Initial encounter.  EXAM: CT ABDOMEN AND PELVIS WITH CONTRAST  TECHNIQUE: Multidetector CT imaging of the abdomen and pelvis was performed using the standard protocol following bolus administration of intravenous contrast.  CONTRAST:  111mL OMNIPAQUE IOHEXOL 300 MG/ML SOLN, 91mL OMNIPAQUE IOHEXOL 300 MG/ML SOLN  COMPARISON:  None.  FINDINGS: Negative lung bases.  No pericardial or pleural effusion.  No acute osseous abnormality  identified.  Indistinct soft tissue thickening in inflammation along the pelvic floor, appears mostly related to the distal rectum near the anal verge. See series 2, image 75. There is associated mucosal hyper enhancement. There is circumferential distal rectal wall thickening up to 14 mm. The superficial perineum soft tissues are spared.  There is mild presacral stranding and a small volume of pelvic free fluid. The more proximal rectum has a more normal appearance, although there is retained stool and mild diverticulosis. The bladder is unremarkable. Distal ureters appear normal. Uterus and adnexa appear within  normal limits.  Severe diverticulosis of the sigmoid colon. There are areas of sigmoid wall thickening up to 8 mm, but without definite sigmoid mesenteric stranding.  Severe diverticulosis continues more proximally in the colon to the splenic flexure. There is moderate transverse colon diverticulosis. There is severe diverticulosis at the hepatic flexure and in the right colon. Oral contrast has reached the hepatic flexure. No active inflammation identified in these regions. The appendix may be diminutive or surgically absent. No pericecal inflammation. Negative terminal ileum. No dilated or inflamed small bowel. Small sliding-type hiatal hernia. Otherwise negative stomach and duodenum.  Cholelithiasis, with 82 cm diameter gallstone within the body of the gallbladder. No pericholecystic inflammation.  Liver, spleen, adrenal glands, and portal venous system are within normal limits. Aortoiliac calcified atherosclerosis noted. Major arterial structures are patent. No abdominal free fluid. No lymphadenopathy. Bilateral renal enhancement and contrast excretion are within normal limits. Bilateral renal cysts, the largest with simple fluid densitometry.  In the anterior body or neck of the pancreas there is a 12 x 10 x 9 mm cystic lesion. This is not associated with main pancreatic ductal dilatation, and  elsewhere the pancreatic parenchyma appears within normal limits. No surrounding inflammation or lymphadenopathy.  IMPRESSION: 1. Inflamed distal rectum/anus compatible with acute proctitis. Reactive appearing small volume pelvic free fluid and presacral stranding. 2. Severe diverticulosis throughout the more proximal colon. Mild acute sigmoid diverticulitis is difficult to exclude, but otherwise there is no active inflammation identified. 3. Small 13 mm solitary cystic lesion in the body of the pancreas. Followup with pancreas protocol MRI of the abdomen without and with contrast in 1 year. This recommendation follows ACR consensus guidelines: Managing Incidental Findings on Abdominal CT: White Paper of the ACR Incidental Findings Committee. J Am Coll Radiol 2010;7:754-773 . 4. Cholelithiasis. 5. Extensive aortoiliac calcified atherosclerosis.   Electronically Signed   By: Genevie Ann M.D.   On: 10/01/2014 13:23  patient and spouse understand the importance of close follow-up including repeat imaging for pancreatic cyst.   EKG Interpretation None      MDM   Final diagnoses:  Proctitis    CT scan performed for new-onset constipation an elderly woman with an abnormal digital rectal exam.  CT scan concerning for acute proctitis.  Patient has had 2 more bowel movements while emergency department is feeling much better at this time.  Will cover with a short course of antibiotics and have asked her to follow-up with GI.    Jola Schmidt, MD 10/01/14 1415

## 2014-10-01 NOTE — Discharge Instructions (Signed)
Proctitis °Proctitis is the swelling and soreness (inflammation) of the lining of the rectum. The rectum is at the end of the large intestine and is attached to the anus. The inflammation causes pain and discomfort. It may be short-term (acute) or long-lasting (chronic). °CAUSES °Inflammation in the rectum can be caused by many things, such as: °· Sexually transmitted diseases (STDs). °· Infection. °· Anal-rectal trauma or injury. °· Ulcerative colitis or Crohn's disease. °· Radiation therapy directed near the rectum. °· Antibiotic therapy. °SYMPTOMS °· Sudden, uncomfortable, and frequent urge to have a bowel movement. °· Anal or rectal pain. °· Abdominal cramping or pain. °· Sensation that the rectum is full. °· Rectal bleeding. °· Pus or mucus discharge from anus. °· Diarrhea or frequent soft, loose stools. °DIAGNOSIS °Diagnosis may include the following: °· A history and physical exam. °· An STD test. °· Blood tests. °· Stool tests. °· Rectal culture. °· A procedure to evaluate the anal canal (anoscopy). °· Procedures to look at part, or the entire large bowel (sigmoidoscopy, colonoscopy). °TREATMENT °Treatment of proctitis depends on the cause. Reducing the symptoms of inflammation and eliminating infection are the main goals of treatment. Treatment may include: °· Home remedies and lifestyle, such as sitz baths and avoiding food right before bedtime. °· Topical ointments, foams, suppositories, or enemas, such as corticosteroids or anti-inflammatories. °· Antibiotic or antiviral medicines to treat infection or to control harmful bacteria. °· Medicines to control diarrhea, soften stools, and reduce pain. °· Medicines to suppress the immune system. °· Avoiding the activity that caused rectal trauma. °· Nutritional, dietary, or herbal supplements. °· Heat or laser therapy for persistent bleeding. °· A dilation procedure to enlarge a narrowed rectum. °· Surgery, though rare, may be necessary to repair damaged rectal  lining. °HOME CARE INSTRUCTIONS °Only take medicines that are recommended or approved by your caregiver. Do not take anti-diarrhea medicine without your caregiver's approval. °SEEK MEDICAL CARE IF: °· You often experience one or more of the symptoms noted above. °· You keep experiencing symptoms after treatment. °· You have questions or concerns about your symptoms or treatment plan. °MAKE SURE YOU: °· Understand these instructions. °· Will watch your condition. °· Will get help right away if you are not doing well or get worse. °FOR MORE INFORMATION °National Institute of Diabetes and Digestive and Kidney Disease (NIDDK): www.digestive.niddk.hih.gov/ °Document Released: 06/15/2011 Document Revised: 10/21/2012 Document Reviewed: 06/15/2011 °ExitCare® Patient Information ©2015 [employer]. This information is not intended to replace advice given to you by your health care provider. Make sure you discuss any questions you have with your health care provider. ° °

## 2014-10-01 NOTE — ED Notes (Signed)
Pt ambulated to the bathroom and reports she was able to have a small bowel movement.

## 2014-10-01 NOTE — ED Notes (Signed)
Bed: WA09 Expected date:  Expected time:  Means of arrival:  Comments: EMS - [facility], last BM 3 days ago

## 2014-10-01 NOTE — ED Notes (Signed)
Pt presents via EMS from home with c/o constipation. Per EMS, she reports that she has not had a bowel movement in three days. Pt is alert and oriented, has not contacted her PCP reference this complaint.

## 2014-10-05 ENCOUNTER — Telehealth: Payer: Self-pay | Admitting: Internal Medicine

## 2014-10-05 MED ORDER — HYDROCORTISONE ACETATE 25 MG RE SUPP
25.0000 mg | Freq: Two times a day (BID) | RECTAL | Status: DC
Start: 2014-10-05 — End: 2015-02-08

## 2014-10-05 MED ORDER — HYOSCYAMINE SULFATE ER 0.375 MG PO TBCR: 0.3750 mg | EXTENDED_RELEASE_TABLET | Freq: Two times a day (BID) | ORAL | Status: DC

## 2014-10-05 NOTE — Telephone Encounter (Signed)
Begin Anusol HC suppositories twice a day for presumed proctitis. Hyomax 0.375 mg twice a day when necessary abdominal pain If not better next 24 hours patient will need an office visit

## 2014-10-05 NOTE — Telephone Encounter (Signed)
Cynthia Mathis pt was seen in the ER Thursday and told she has proctitis. Pt was placed on Cipro and Flagyl. States she is having lower abdominal discomfort, states it is constant cramping discomfort. States she is going to the bathroom about every 55min with diarrhea stools, states there was some blood in the stool yesterday. Dr. Deatra Ina as doc of the day please advise.

## 2014-10-05 NOTE — Telephone Encounter (Signed)
Spoke with pt and she is aware. Scripts sent to pharmacy. 

## 2014-10-15 ENCOUNTER — Ambulatory Visit (INDEPENDENT_AMBULATORY_CARE_PROVIDER_SITE_OTHER): Payer: Medicare Other | Admitting: Physician Assistant

## 2014-10-15 ENCOUNTER — Other Ambulatory Visit (INDEPENDENT_AMBULATORY_CARE_PROVIDER_SITE_OTHER): Payer: Medicare Other

## 2014-10-15 ENCOUNTER — Encounter: Payer: Self-pay | Admitting: Physician Assistant

## 2014-10-15 VITALS — BP 148/70 | HR 60 | Ht 62.0 in | Wt 134.5 lb

## 2014-10-15 DIAGNOSIS — R935 Abnormal findings on diagnostic imaging of other abdominal regions, including retroperitoneum: Secondary | ICD-10-CM | POA: Diagnosis not present

## 2014-10-15 DIAGNOSIS — K5900 Constipation, unspecified: Secondary | ICD-10-CM | POA: Diagnosis not present

## 2014-10-15 DIAGNOSIS — R1032 Left lower quadrant pain: Secondary | ICD-10-CM

## 2014-10-15 DIAGNOSIS — K51218 Ulcerative (chronic) proctitis with other complication: Secondary | ICD-10-CM | POA: Diagnosis not present

## 2014-10-15 DIAGNOSIS — R1031 Right lower quadrant pain: Secondary | ICD-10-CM

## 2014-10-15 LAB — CBC WITH DIFFERENTIAL/PLATELET
BASOS PCT: 0.5 % (ref 0.0–3.0)
Basophils Absolute: 0 10*3/uL (ref 0.0–0.1)
Eosinophils Absolute: 0.3 10*3/uL (ref 0.0–0.7)
Eosinophils Relative: 3.4 % (ref 0.0–5.0)
HCT: 42.2 % (ref 36.0–46.0)
Hemoglobin: 14.3 g/dL (ref 12.0–15.0)
LYMPHS PCT: 30 % (ref 12.0–46.0)
Lymphs Abs: 3 10*3/uL (ref 0.7–4.0)
MCHC: 34 g/dL (ref 30.0–36.0)
MCV: 89.6 fl (ref 78.0–100.0)
MONO ABS: 0.7 10*3/uL (ref 0.1–1.0)
Monocytes Relative: 6.5 % (ref 3.0–12.0)
Neutro Abs: 6 10*3/uL (ref 1.4–7.7)
Neutrophils Relative %: 59.6 % (ref 43.0–77.0)
Platelets: 242 10*3/uL (ref 150.0–400.0)
RBC: 4.71 Mil/uL (ref 3.87–5.11)
RDW: 13.4 % (ref 11.5–15.5)
WBC: 10.1 10*3/uL (ref 4.0–10.5)

## 2014-10-15 LAB — SEDIMENTATION RATE: Sed Rate: 14 mm/hr (ref 0–22)

## 2014-10-15 NOTE — Patient Instructions (Signed)
Please go to the basement level to have your labs drawn.  Continue the Colace ( Docusate)  Twice daily.  You have been scheduled for a colonoscopy. Please follow written instructions given to you at your visit today.   If you use inhalers (even only as needed), please bring them with you on the day of your procedure.  Jamichael Knotts will call you when the prep sample comes in so you won't have to buy one at the pharmacy.

## 2014-10-15 NOTE — Progress Notes (Signed)
Patient ID: Cynthia Mathis, female   DOB: 08/24/1932, 81 y.o.   MRN: [id number]   Subjective:    Patient ID: Cynthia Mathis, female    DOB: 1932-10-24, 81 y.o.   MRN: [id number]  HPI Cynthia Mathis  is a pleasant 81 year old white female known to [name] from prior colonoscopy. She is patient of [name] tells with history of hypertension, hypothyroidism, mild memory loss, diverticulosis and is status post appendectomy. She had colonoscopy done in 2013 4 history of adenomatous polyps. She was noted to have severe diverticulosis throughout the colon and an otherwise normal exam. She comes in today in follow-up after an ER visit on 10/01/2014. She had presented with complaints of lower abdominal pain and constipation with onset same day as that visit. She has not the best historian but says she had been constipated for several days prior to that time but developed lower abdominal pressure and discomfort on the day of the ER visit and was unable to have have any sort bowel movement despite straining. She does not believe that she saw any blood prior to the ER visit but says she did see small amounts of blood after that. He had no associated nausea vomiting fever chills diaphoresis etc. Costella Hatcher the emergency room showed a WBC of 22.6 hemoglobin 14.8 hematocrit of 44.7. She had CT scan of the abdomen and pelvis done which showed indistinct soft tissue thickening and inflammation along the pelvic floor appears most related to the distal rectum near the anal verge and associated mucosal hyperenhancement circumferential distal rectal wall thickening up to 14 mm superficial perineum soft tissues are spared mild presacral stranding and a small volume of free fluid in the more proximal rectum has a normal appearance although there is retained stool and mild diverticulosis bladder unremarkable diverticulosis of the sigmoid colon no definite diverticulitis also severe diverticulosis at the hepatic flexure and in the  right colon 1 gallstone noted and a small 13 mm solitary cystic lesion in the body of the pancreas with MRI recommended in 1 year. Patient had been given a course of Cipro and Flagyl and says she feels better at this point but still has some residual lower abdominal tenderness. Her bowel movements have been normal, she's eating without difficulty, no fever. She says she has had no rectal pain throughout this entire episode..  Review of Systems Pertinent positive and negative review of systems were noted in the above HPI section.  All other review of systems was otherwise negative.  Outpatient Encounter Prescriptions as of 10/15/2014  Medication Sig  . aspirin 81 MG tablet Take 81 mg by mouth daily.  . Calcium Carbonate-Vitamin D (CALCIUM 600+D) 600-400 MG-UNIT per tablet Take 1 tablet by mouth daily.  . Cyanocobalamin (VITAMIN B 12 PO) Take 1 tablet by mouth daily.  [name] docusate sodium (COLACE) 100 MG capsule Take 1 capsule (100 mg total) by mouth every 12 (twelve) hours.  [name] donepezil (ARICEPT) 10 MG tablet Take 10 mg by mouth at bedtime.  . fish oil-omega-3 fatty acids 1000 MG capsule Take 1 g by mouth daily.  [name] glucosamine-chondroitin 500-400 MG tablet Take 1 tablet by mouth 3 (three) times daily.  . hydrochlorothiazide (HYDRODIURIL) 25 MG tablet Take 1 tablet (25 mg total) by mouth daily.  . hydrocortisone (ANUSOL-HC) 25 MG suppository Place 1 suppository (25 mg total) rectally 2 (two) times daily.  [name] Hyoscyamine Sulfate 0.375 MG TBCR Take 1 tablet (0.375 mg total) by mouth 2 (two) times daily.  . memantine (  NAMENDA) 10 MG tablet Take 10 mg by mouth 2 (two) times daily.   . Multiple Vitamins-Minerals (MULTIVITAMIN & MINERAL PO) Take 1 tablet by mouth daily.  . simvastatin (ZOCOR) 20 MG tablet TAKE 1 TABLET (20 MG TOTAL) BY MOUTH AT BEDTIME.  . valsartan (DIOVAN) 160 MG tablet Take 160 mg by mouth daily.  . verapamil (CALAN-SR) 240 MG CR tablet TAKE 1 TABLET (240 MG TOTAL)  BY MOUTH AT BEDTIME.    . [DISCONTINUED] ciprofloxacin (CIPRO) 500 MG tablet Take 1 tablet (500 mg total) by mouth every 12 (twelve) hours.  . [DISCONTINUED] metroNIDAZOLE (FLAGYL) 500 MG tablet Take 1 tablet (500 mg total) by mouth 2 (two) times daily.   Allergies  Allergen Reactions  . Epinephrine Palpitations    REACTION: heart racing   Patient Active Problem List   Diagnosis Date Noted  . Osteopenia 08/04/2013  . Memory loss 09/17/2012  . 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 Mass 04/04/2012  . MACULAR DEGENERATION 08/12/2008  . Osteoarthritis 08/12/2008  . Impaired fasting glucose 02/10/2008  . COLONIC POLYPS 10/29/2007  . LIPOMA 10/29/2007  . Diverticulosis of large intestine 10/29/2007  . HEMATURIA, HX OF 10/29/2007  . ANXIETY 09/27/2007  . Hypothyroidism 08/08/2007  . Dyslipidemia 08/08/2007  . Essential hypertension 08/08/2007   History   Social History  . Marital Status: Married    Spouse Name: Cynthia Mathis  . Number of Children: 3  . Years of Education: [employer]   Occupational History  . seamstress    Social History Main Topics  . Smoking status: Never Smoker   . Smokeless tobacco: Never Used  . Alcohol Use: 0.0 oz/week    0 Standard drinks or equivalent per week     Comment: occasionally  . Drug Use: No  . Sexual Activity: Not on file   Other Topics Concern  . Not on file   Social History Narrative   Pt lives at home with her spouse Cynthia Masters Claunch, who has been dx'd with mild cognitive impairment vs. mild dementia, former pt of [name]). She does not use caffeine, if so, rarely.    Ms. Philippi's family history includes Stroke in her mother. There is no history of Colon cancer or Stomach cancer.      Objective:    Filed Vitals:   10/15/14 1331  BP: 148/70  Pulse: 60    Physical Exam well-developed elderly white female in no acute distress, pleasant accompanied by her husband blood pressure 148/70 pulse 60 height 5 foot 2 weight 134. HEENT; nontraumatic  normocephalic EOMI PERRLA sclera anicteric, Supple; no JVD, Cardiovascular; regular rate and rhythm with S1-S2 no murmur or gallop, Pulmonary; clear bilaterally, Abdomen; soft she has some mild tenderness bilaterally in the lower quadrants there is no guarding or rebound no palpable mass or hepatosplenomegaly bowel sounds are present, Rectal ;exam not done this was done in the emergency room with report showing mucosal abnormality on digital exam, Extremities ;no clubbing cyanosis or edema skin warm and dry, Psych; mood and affect appropriate       Assessment & Plan:   #1 81 yo female with  An acute episode 2 weeks ago with abrupt onset of constipation and lower pelvic pain-  Workup  In  ER with dx of acute proctitis with significant rectal and perirectal inflammatory changes, and leukocytosis. Pt much better but persistent mild tenderness This is an unusual presentation for "proctitis"- pt has severe diverticulosis and wonder if she actually had diverticulitis near rectum, r/o malignancy #  2 hx of adenomatous polyps #3 HTN #4 memory loss  Plan; repeat cbc, esr,crp today Will schedule for colonoscopy with Dr .Henrene Pastor - procedure discussed in detail with pt and she is agreeable to proceed. If symptoms recur pt knows to call and would re-image first If WBC still elevated may give another course of Cipro/flagyl before colonoscopy.   Adrena Nakamura Genia Harold PA-C 10/15/2014   Cc: Noralee Space, MD

## 2014-10-16 NOTE — Progress Notes (Signed)
Agree with initial assessment and plans 

## 2014-10-21 ENCOUNTER — Telehealth: Payer: Self-pay | Admitting: *Deleted

## 2014-10-21 NOTE — Telephone Encounter (Signed)
Lm for the patient to let her know I have a free Suprep at our front desk for her. I reminded her the colonoscopy is scheduled for 12-02-2014 with Dr. Scarlette Shorts.

## 2014-11-12 DIAGNOSIS — Z1231 Encounter for screening mammogram for malignant neoplasm of breast: Secondary | ICD-10-CM | POA: Diagnosis not present

## 2014-11-20 ENCOUNTER — Encounter: Payer: Medicare Other | Admitting: Internal Medicine

## 2014-11-23 DIAGNOSIS — H3532 Exudative age-related macular degeneration: Secondary | ICD-10-CM | POA: Diagnosis not present

## 2014-11-27 DIAGNOSIS — H3532 Exudative age-related macular degeneration: Secondary | ICD-10-CM | POA: Diagnosis not present

## 2014-12-02 ENCOUNTER — Encounter: Payer: Self-pay | Admitting: Internal Medicine

## 2014-12-02 ENCOUNTER — Ambulatory Visit (AMBULATORY_SURGERY_CENTER): Payer: Medicare Other | Admitting: Internal Medicine

## 2014-12-02 VITALS — BP 150/62 | HR 57 | Temp 98.8°F | Resp 24 | Ht 62.0 in | Wt 134.0 lb

## 2014-12-02 DIAGNOSIS — R935 Abnormal findings on diagnostic imaging of other abdominal regions, including retroperitoneum: Secondary | ICD-10-CM

## 2014-12-02 DIAGNOSIS — K5730 Diverticulosis of large intestine without perforation or abscess without bleeding: Secondary | ICD-10-CM

## 2014-12-02 DIAGNOSIS — K51219 Ulcerative (chronic) proctitis with unspecified complications: Secondary | ICD-10-CM | POA: Diagnosis present

## 2014-12-02 DIAGNOSIS — K6289 Other specified diseases of anus and rectum: Secondary | ICD-10-CM | POA: Diagnosis not present

## 2014-12-02 HISTORY — PX: COLONOSCOPY: SHX174

## 2014-12-02 MED ORDER — SODIUM CHLORIDE 0.9 % IV SOLN: 500.0000 mL | INTRAVENOUS | Status: DC

## 2014-12-02 NOTE — Patient Instructions (Signed)
YOU HAD AN ENDOSCOPIC PROCEDURE TODAY AT [facility]:   Refer to the procedure report that was given to you for any specific questions about what was found during the examination.  If the procedure report does not answer your questions, please call your gastroenterologist to clarify.  If you requested that your care partner not be given the details of your procedure findings, then the procedure report has been included in a sealed envelope for you to review at your convenience later.  YOU SHOULD EXPECT: Some feelings of bloating in the abdomen. Passage of more gas than usual.  Walking can help get rid of the air that was put into your GI tract during the procedure and reduce the bloating. If you had a lower endoscopy (such as a colonoscopy or flexible sigmoidoscopy) you may notice spotting of blood in your stool or on the toilet paper. If you underwent a bowel prep for your procedure, you may not have a normal bowel movement for a few days.  Please Note:  You might notice some irritation and congestion in your nose or some drainage.  This is from the oxygen used during your procedure.  There is no need for concern and it should clear up in a day or so.  SYMPTOMS TO REPORT IMMEDIATELY:   Following lower endoscopy (colonoscopy or flexible sigmoidoscopy):  Excessive amounts of blood in the stool  Significant tenderness or worsening of abdominal pains  Swelling of the abdomen that is new, acute  Fever of 100F or higher    For urgent or emergent issues, a gastroenterologist can be reached at any hour by calling [phone].   DIET: Your first meal following the procedure should be a small meal and then it is ok to progress to your normal diet. Heavy or fried foods are harder to digest and may make you feel nauseous or bloated.  Likewise, meals heavy in dairy and vegetables can increase bloating.  Drink plenty of fluids but you should avoid alcoholic beverages for 24  hours.  ACTIVITY:  You should plan to take it easy for the rest of today and you should NOT DRIVE or use heavy machinery until tomorrow (because of the sedation medicines used during the test).    FOLLOW UP: Our staff will call the number listed on your records the next business day following your procedure to check on you and address any questions or concerns that you may have regarding the information given to you following your procedure. If we do not reach you, we will leave a message.  However, if you are feeling well and you are not experiencing any problems, there is no need to return our call.  We will assume that you have returned to your regular daily activities without incident.  If any biopsies were taken you will be contacted by phone or by letter within the next 1-3 weeks.  Please call us at [phone] if you have not heard about the biopsies in 3 weeks.    SIGNATURES/CONFIDENTIALITY: You and/or your care partner have signed paperwork which will be entered into your electronic medical record.  These signatures attest to the fact that that the information above on your After Visit Summary has been reviewed and is understood.  Full responsibility of the confidentiality of this discharge information lies with you and/or your care-partner.   Information on diverticulosis & high fiber diet given to you today

## 2014-12-02 NOTE — Progress Notes (Signed)
Transferred to recovery room. A/O x3, pleased with MAC.  VSS.  Report to Penny, RN. 

## 2014-12-02 NOTE — Op Note (Signed)
[facility]  [address] [place], [id number]   COLONOSCOPY PROCEDURE REPORT  PATIENT: Cynthia, Mathis  MR#: [id number] BIRTHDATE: [birth year] , 82  yrs. old GENDER: female ENDOSCOPIST: Eustace Quail, MD REFERRED BY:.  Self / Office PROCEDURE DATE:  12/02/2014 PROCEDURE:   Colonoscopy, diagnostic First Screening Colonoscopy - Avg.  risk and is 50 yrs.  old or older - No.  Prior Negative Screening - Now for repeat screening. N/A  History of Adenoma - Now for follow-up colonoscopy & has been > or = to 3 yrs.  N/A  Polyps removed today? No Recommend repeat exam, <10 yrs? No ASA CLASS:   Class II INDICATIONS:Evaluation of barium enema or other imaging study of an abnormality that is likely to be clinically significant, such as filling defect or stricture and Patient is not applicable for Colorectal Neoplasm Risk Assessment for this procedure. MEDICATIONS: Monitored anesthesia care and Propofol 120 mg IV  DESCRIPTION OF PROCEDURE:   After the risks benefits and alternatives of the procedure were thoroughly explained, informed consent was obtained.  The digital rectal exam revealed external hemorrhoids.   The [id number]  endoscope was introduced through the anus and advanced to the cecum, which was identified by both the appendix and ileocecal valve. No adverse events experienced.   The quality of the prep was excellent.  (MoviPrep was used)  The instrument was then slowly withdrawn as the colon was fully examined. Estimated blood loss is zero unless otherwise noted in this procedure report.     COLON FINDINGS: There was severe diverticulosis noted throughout the entire examined colon.   The examination was otherwise normal. There was no evidence for active inflammation, mass, or other abnormality.  Retroflexed views revealed internal hemorrhoids. The time to cecum = 3.3 Withdrawal time = 5.0   The scope was withdrawn and the procedure  completed.  COMPLICATIONS: There were no immediate complications.  ENDOSCOPIC IMPRESSION: 1.   Severe diverticulosis was noted throughout the entire examined colon 2.   The examination was otherwise normal  RECOMMENDATIONS: 1. Return to the care of your primary provider.  GI follow up as needed  eSigned:  Eustace Quail, MD 12/02/2014 1:35 PM   cc: Noralee Space, MD and The Patient

## 2014-12-03 ENCOUNTER — Telehealth: Payer: Self-pay | Admitting: *Deleted

## 2014-12-03 NOTE — Telephone Encounter (Signed)
  Follow up Call-  Call back number 12/02/2014  Post procedure Call Back phone  # [phone]  Permission to leave phone message Yes     Patient questions:  Do you have a fever, pain , or abdominal swelling? No. Pain Score  0 *  Have you tolerated food without any problems? Yes.    Have you been able to return to your normal activities? Yes.    Do you have any questions about your discharge instructions: Diet   No. Medications  No. Follow up visit  No.  Do you have questions or concerns about your Care? No.  Actions: * If pain score is 4 or above: No action needed, pain <4.

## 2014-12-12 ENCOUNTER — Other Ambulatory Visit: Payer: Self-pay | Admitting: Pulmonary Disease

## 2014-12-14 NOTE — Telephone Encounter (Signed)
Diovan refilled until f/u in August 2016

## 2015-01-04 ENCOUNTER — Ambulatory Visit (INDEPENDENT_AMBULATORY_CARE_PROVIDER_SITE_OTHER): Payer: Medicare Other | Admitting: Diagnostic Neuroimaging

## 2015-01-04 ENCOUNTER — Other Ambulatory Visit: Payer: Self-pay

## 2015-01-04 ENCOUNTER — Encounter: Payer: Self-pay | Admitting: Diagnostic Neuroimaging

## 2015-01-04 VITALS — BP 136/67 | HR 62 | Ht 62.0 in | Wt 138.0 lb

## 2015-01-04 DIAGNOSIS — G3184 Mild cognitive impairment, so stated: Secondary | ICD-10-CM | POA: Diagnosis not present

## 2015-01-04 DIAGNOSIS — R413 Other amnesia: Secondary | ICD-10-CM

## 2015-01-04 MED ORDER — MEMANTINE HCL 10 MG PO TABS: 10.0000 mg | ORAL_TABLET | Freq: Two times a day (BID) | ORAL | Status: DC

## 2015-01-04 MED ORDER — DONEPEZIL HCL 10 MG PO TABS: 10.0000 mg | ORAL_TABLET | Freq: Every day | ORAL | Status: DC

## 2015-01-04 NOTE — Progress Notes (Signed)
[facility]  PATIENT: Cynthia Mathis DOB: 17-Aug-1932  REFERRING CLINICIAN: Nadel HISTORY FROM: patient and husband REASON FOR VISIT: follow up   HISTORICAL  CHIEF COMPLAINT:  Chief Complaint  Patient presents with  . Memory Loss    rm 7, husband, MMSE 28  . Follow-up    HISTORY OF PRESENT ILLNESS:   UPDATE 01/04/15: Since last visit, started memantine 10mg  BID, could not tolerate, so started taking it daily. No new events. Memory stable.   UPDATE 07/01/14: Since last visit, memory loss has progressed. Now on donepezil 10mg  qhs per [name]. Tolerating without side effects.  UPDATE 04/23/13: Since last visit patient continues to have short-term memory problems. This is when she has conversations with her husband and then cannot remember the details later. She also has some difficulty remembering peoples names after meeting them. Other people have not noticed any significant memory problems. Patient notices these problems primarily herself. Patient denies any significant depression. He does have some anxiety feelings with tense turning feeling in her stomach especially if her husband gets upset with her.  PRIOR HPI (10/07/12): 81 year old right-handed female here for evaluation of memory loss. Patient accompanied by her husband. For past one year patient is having increasing trouble with forgetting things. She's having to rely on taking notes, maintaining a calendar, using lists to remember things. Patient accompanied by her husband, who also has been diagnosed with mild cognitive impairment versus mild dementia by [name]. He takes galantamine, which he says has helped his memory. Other examples include forgetting recent conversations, recent events, repeating questions over and over. Unfortunately the only informant of this problem is the patient's husband, who also happens to have mild cognitive impairment or dementia. No other family members or friends have  noticed this problem in the patient. Otherwise patient is doing well still able to maintain activities of daily living including cooking, cleaning, driving, shopping.   REVIEW OF SYSTEMS: Full 14 system review of systems performed and notable only for hearing loss loss of vision blurred vision.    ALLERGIES: Allergies  Allergen Reactions  . Epinephrine Palpitations    REACTION: heart racing    HOME MEDICATIONS: Outpatient Prescriptions Prior to Visit  Medication Sig Dispense Refill  . aspirin 81 MG tablet Take 81 mg by mouth daily.    . Calcium Carbonate-Vitamin D (CALCIUM 600+D) 600-400 MG-UNIT per tablet Take 1 tablet by mouth daily.    . Cyanocobalamin (VITAMIN B 12 PO) Take 1 tablet by mouth daily.    [name] docusate sodium (COLACE) 100 MG capsule Take 1 capsule (100 mg total) by mouth every 12 (twelve) hours. 60 capsule 0  . donepezil (ARICEPT) 10 MG tablet Take 10 mg by mouth at bedtime.    . fish oil-omega-3 fatty acids 1000 MG capsule Take 1 g by mouth daily.    [name] glucosamine-chondroitin 500-400 MG tablet Take 1 tablet by mouth 3 (three) times daily.    . hydrochlorothiazide (HYDRODIURIL) 25 MG tablet Take 1 tablet (25 mg total) by mouth daily. 90 tablet 3  . hydrocortisone (ANUSOL-HC) 25 MG suppository Place 1 suppository (25 mg total) rectally 2 (two) times daily. 12 suppository 0  . Hyoscyamine Sulfate 0.375 MG TBCR Take 1 tablet (0.375 mg total) by mouth 2 (two) times daily. 60 tablet 1  . memantine (NAMENDA) 10 MG tablet Take 10 mg by mouth 2 (two) times daily.     . Multiple Vitamins-Minerals (MULTIVITAMIN & MINERAL PO) Take 1 tablet by mouth  daily.    . simvastatin (ZOCOR) 20 MG tablet TAKE 1 TABLET (20 MG TOTAL) BY MOUTH AT BEDTIME. 90 tablet 3  . valsartan (DIOVAN) 160 MG tablet TAKE 1 TABLET (160 MG TOTAL) BY MOUTH DAILY. 90 tablet 0  . verapamil (CALAN-SR) 240 MG CR tablet TAKE 1 TABLET (240 MG TOTAL)  BY MOUTH AT BEDTIME. 90 tablet 3  . valsartan (DIOVAN) 160 MG tablet  Take 160 mg by mouth daily.     No facility-administered medications prior to visit.    PAST MEDICAL HISTORY: Past Medical History  Diagnosis Date  . Macular degeneration   . Hypertension   . Dyslipidemia   . Borderline diabetes mellitus   . Hypothyroid   . Diverticulosis of colon   . Hx of colonic polyps   . History of hematuria   . DJD (degenerative joint disease)   . History of osteoporosis   . Memory loss   . Anxiety   . Lipoma   . Hyperlipidemia     PAST SURGICAL HISTORY: Past Surgical History  Procedure Laterality Date  . Thyroidectomy  1990    [name]  . Appendectomy  1947  . Colonoscopy    . Mass excision  04/17/2012    Procedure: EXCISION MASS;  Surgeon: [name], MD;  Location: [facility];  Service: General;  Laterality: N/A;    FAMILY HISTORY: Family History  Problem Relation Age of Onset  . Colon cancer Neg Hx   . Stomach cancer Neg Hx   . Stroke Mother     SOCIAL HISTORY:  History   Social History  . Marital Status: Married    Spouse Name: Mariane Masters Buchinger  . Number of Children: 3  . Years of Education: [employer] Sch[employer]   Occupational History  . seamstress    Social History Main Topics  . Smoking status: Never Smoker   . Smokeless tobacco: Never Used  . Alcohol Use: 0.0 oz/week    0 Standard drinks or equivalent per week     Comment: occasionally  . Drug Use: No  . Sexual Activity: Not on file   Other Topics Concern  . Not on file   Social History Narrative   Pt lives at home with her spouse Mariane Masters [place]der[place]k, who has been dx'd with mild cognitive impairment vs. mild dementia, former pt of [name]). She does not use caffeine, if so, rarely.     PHYSICAL EXAM  Filed Vitals:   01/04/15 1248  BP: 136/67  Pulse: 62  Height: 5\' 2"  (1.575 m)  Weight: 138 lb (62.596 kg)   Body mass index is 25.23 kg/(m^2).  MMSE - Mini Mental State Exam 01/04/2015  Orientation to time 4  Orientation to Place 5  Registration  3  Attention/ Calculation 5  Recall 2  Language- name 2 objects 2  Language- repeat 1  Language- follow 3 step command 3  Language- read & follow direction 1  Write a sentence 1  Copy design 1  Total score 28     Montreal Cognitive Assessment  07/01/2014  Visuospatial/ Executive (0/5) 3  Naming (0/3) 3  Attention: Read list of digits (0/2) 2  Attention: Read list of letters (0/1) 1  Attention: Serial 7 subtraction starting at 100 (0/3) 3  Language: Repeat phrase (0/2) 1  Language : Fluency (0/1) 1  Abstraction (0/2) 2  Delayed Recall (0/5) 0  Orientation (0/6) 4  Total 20  Adjusted Score (based on education) 21  GENERAL EXAM: Patient is in no distress  CARDIOVASCULAR: Regular rate and rhythm, no murmurs, no carotid bruits  NEUROLOGIC: MENTAL STATUS: awake, alert, language fluent, comprehension intact, naming intact; AFT 13. NO FRONTAL RELEASE SIGNS. CRANIAL NERVE: pupils equal and reactive to light, visual fields full to confrontation, extraocular muscles intact, no nystagmus, facial sensation and strength symmetric, uvula midline, shoulder shrug symmetric, tongue midline. MOTOR: normal bulk and tone, full strength in the BUE, BLE SENSORY: normal and symmetric to light touch, temperature, vibration. COORDINATION: finger-nose-finger, fine finger movements normal REFLEXES: deep tendon reflexes present and symmetric GAIT/STATION: narrow based gait; able to walk tandem; romberg is negative   DIAGNOSTIC DATA (LABS, IMAGING, TESTING) - I reviewed patient records, labs, notes, testing and imaging myself where available.  Lab Results  Component Value Date   WBC 10.1 10/15/2014   HGB 14.3 10/15/2014   HCT 42.2 10/15/2014   MCV 89.6 10/15/2014   PLT 242.0 10/15/2014      Component Value Date/Time   NA 140 10/01/2014 1220   K 3.6 10/01/2014 1220   CL 101 10/01/2014 1220   CO2 31 08/11/2014 0810   GLUCOSE 104* 10/01/2014 1220   BUN 21 10/01/2014 1220   CREATININE  0.90 10/01/2014 1220   CALCIUM 9.4 08/11/2014 0810   PROT 7.0 08/11/2014 0810   ALBUMIN 4.2 08/11/2014 0810   AST 19 08/11/2014 0810   ALT 14 08/11/2014 0810   ALKPHOS 53 08/11/2014 0810   BILITOT 0.7 08/11/2014 0810   GFRNONAA 67* 04/15/2012 1500   GFRAA 78* 04/15/2012 1500   Lab Results  Component Value Date   CHOL 161 08/11/2014   HDL 60.70 08/11/2014   LDLCALC 79 08/11/2014   TRIG 107.0 08/11/2014   CHOLHDL 3 08/11/2014   Lab Results  Component Value Date   HGBA1C 6.2 09/17/2012   Lab Results  Component Value Date   VITAMINB12 819 10/07/2012   Lab Results  Component Value Date   TSH 0.76 08/11/2014    10/16/12 MRI brain - mild atrophy and mild chronic small vessel ischemic disease.    ASSESSMENT AND PLAN  81 y.o. year old female  has a past medical history of Macular degeneration; Hypertension; Dyslipidemia; Borderline diabetes mellitus; Hypothyroid; Diverticulosis of colon; colonic polyps; History of hematuria; DJD (degenerative joint disease); History of osteoporosis; Memory loss; Anxiety; Lipoma; and Hyperlipidemia. here with history of short-term memory problems since March 2012. Neurologic examination today is unremarkable.   Cognitive testing has been stable since 2014.  MMSE 29/30 --> 28/30 --> 28/30.  MOCA 21/30 --> 23/30 --> 21/30.  Dx: mild cognitive impairment (mild dementia now less likely given stability of memory/cognitive issues)  PLAN: 1. Continue memantine + donepezil  Return in about 1 year (around 01/04/2016).     Penni Bombard, MD 12/09/930, 3:55 PM Certified in Neurology, Neurophysiology and Neuroimaging  [facility] [address] [place], [place] [address] [phone]

## 2015-01-19 DIAGNOSIS — R8299 Other abnormal findings in urine: Secondary | ICD-10-CM | POA: Diagnosis not present

## 2015-01-19 DIAGNOSIS — T671XXA Heat syncope, initial encounter: Secondary | ICD-10-CM | POA: Diagnosis not present

## 2015-02-05 DIAGNOSIS — H3532 Exudative age-related macular degeneration: Secondary | ICD-10-CM | POA: Diagnosis not present

## 2015-02-08 ENCOUNTER — Encounter: Payer: Self-pay | Admitting: Pulmonary Disease

## 2015-02-08 ENCOUNTER — Ambulatory Visit (INDEPENDENT_AMBULATORY_CARE_PROVIDER_SITE_OTHER): Payer: Medicare Other | Admitting: Pulmonary Disease

## 2015-02-08 VITALS — BP 132/74 | HR 58 | Temp 97.4°F | Ht 62.0 in | Wt 133.6 lb

## 2015-02-08 DIAGNOSIS — R55 Syncope and collapse: Secondary | ICD-10-CM | POA: Diagnosis not present

## 2015-02-08 DIAGNOSIS — M150 Primary generalized (osteo)arthritis: Secondary | ICD-10-CM

## 2015-02-08 DIAGNOSIS — R413 Other amnesia: Secondary | ICD-10-CM

## 2015-02-08 DIAGNOSIS — E785 Hyperlipidemia, unspecified: Secondary | ICD-10-CM | POA: Diagnosis not present

## 2015-02-08 DIAGNOSIS — K5730 Diverticulosis of large intestine without perforation or abscess without bleeding: Secondary | ICD-10-CM

## 2015-02-08 DIAGNOSIS — I10 Essential (primary) hypertension: Secondary | ICD-10-CM

## 2015-02-08 DIAGNOSIS — R7301 Impaired fasting glucose: Secondary | ICD-10-CM

## 2015-02-08 DIAGNOSIS — M8580 Other specified disorders of bone density and structure, unspecified site: Secondary | ICD-10-CM

## 2015-02-08 DIAGNOSIS — E890 Postprocedural hypothyroidism: Secondary | ICD-10-CM

## 2015-02-08 DIAGNOSIS — Z148 Genetic carrier of other disease: Secondary | ICD-10-CM

## 2015-02-08 DIAGNOSIS — M159 Polyosteoarthritis, unspecified: Secondary | ICD-10-CM

## 2015-02-08 NOTE — Patient Instructions (Signed)
Today we updated your med list in our EPIC system...    Continue your current medications the same...  Please bring a copy of the records from [facility] for [place] to review & scan into the EMR...  Call for any questions...  Let's plan a follow up visit in 65mo, sooner if needed for problems.Marland KitchenMarland Kitchen

## 2015-02-25 ENCOUNTER — Encounter: Payer: Self-pay | Admitting: Pulmonary Disease

## 2015-02-25 DIAGNOSIS — H40033 Anatomical narrow angle, bilateral: Secondary | ICD-10-CM | POA: Diagnosis not present

## 2015-02-25 NOTE — Progress Notes (Signed)
Subjective:    Patient ID: Cynthia Mathis, female    DOB: [birth year], 81 y.o.   MRN: [id number]  HPI 81 y/o WF here for a follow up visit... she has mult med problems as noted below ...  ~  SEE PREV EPIC NOTES FOR OLDER DATA >>   ~  sees DrPerry for GI- divertics, polyps, hems & a bout of diverticulitis in Mar08...  ~  sees Dr Risa Grill Urology w/ microscopic hematuria and a neg eval 5/08... ~  sees Engineering geologist for [place]thia Mathis w/ macular degen- notes reviewed... ~  sees DrLLomax for Derm w/ alopecia- offered Rogaine but decided against it... ~  Sees DrPenumali for Neuro w/ memory loss, early stage dementia...   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  ~  September 17, 2012:  73mo ROV & Cynthia Mathis is concerned about her memory; husb has been under the care of DrLove for >18yrs w/ mild cognitive impairment, her MMSE is abn & we will refer to [facility] for further assessment & scans... We reviewed the following medical problems during today's office visit >>     Macular Degen> followed by DrHecker & DrRankin...    HBP> on ASA81, Metop25, Verap240, HCT25; BP= 140/80 & she denies HA, CP, palpit, dizzy, SOB, edema, etc...    Hyperlipid> on Simva20; last FLP 1/13 showed TChol 169, TG 118, HDL 56, LDL 89; she is not fasting today...    Hx Impaired FBS> Hx BS in the 130 range, but all readings ~100 x yrs now;  Labs 3/14 showed BS=106, A1c=6.2.Marland KitchenMarland Kitchen    Hypothy> hx multinod thyroid x yrs; had thyroidectomy 1990 by DrWeatherly; she weaned off Thyroid hormone yrs ago & has remained clinically & biochem euthyroid; TSH= 0.37    GI- Divertics, Polyps> hx diverticulitis in 2008- followed by DrPerry; last colonoscopy 4/13 showed severe diverticulosis w/o inflamm or polyps seen...    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     DJD, Osteoporosis> on Ca, MVI, Glucosamine; she uses OTC analgesic meds as needed; see BMDs below- on Bisphos drug holiday since 7/11 & due for f/u BMD...    Memory Loss> she is c/o difficulty w/ name, dates, etc; MMSE is abnormal & we will refer to [name]...    Anxiety> no requiring meds... We reviewed prob list, meds, xrays and labs> see below for updates >>   LABS 3/14:  Chems- wnl w/ BS=106, A1c=6.2;  TSH=0.37;  Sed=14...    ~  January 23, 2013:  36mo ROV & Cynthia Mathis notes that her memory is slipping some & her hearing is not as good;  She had f/u visit w/ DrPenumalli 3/14> her MMSE was 29/30 & he planned to check MRI Brain & B12 level, he did not rec starting meds yet (husb takes Razadyne & feels it helps him); he does rec fresh fruits & vegetables + physical exercise...    BP is controlled on ToprolXL25, CalanSR240, & HCT25; BP= 142/78 & she denies HA, CP, palpit, SOB, edema...    Chol is controlled on Simva20 + Fish Oil; Last FLP was 1/13 as she cannot seem to remember to come fasting for blood work (see below)...    She remains on a bisphos drug holiday & needs f/u BMD soon to re-assess...  We reviewed prob list, meds, xrays and labs> see below for updates >>  ADDENDUM 04/15/13 >> pt called & wants off Simva20 after reading something about memory loss=> change to Atorva20 (she never did)...  ~  August 04, 2013:  35mo ROV & Cynthia Mathis states she's doing well- no new complaints or concerns...    Macular Degen> followed by DrHecker & DrRankin...    HBP> on ASA81, Metop25, Verap240, HCT25; BP= 140/82 & she denies HA, CP, palpit, dizzy, SOB, edema, etc; she is bradycardic & we decided to stop ToprolXL & replace it w/ [id number]...    Hyperlipid> on Simva20; last FLP 1/15 showed TChol 155, TG 122, HDL 50, LDL 81; she is not fasting today...    Hx Impaired FBS> Hx BS in the 130 range, but all readings ~100 x yrs now;  Labs 1/15 showed BS=103    Hypothy> hx multinod thyroid x yrs; had  thyroidectomy 1990 by DrWeatherly; she weaned off Thyroid hormone yrs ago & has remained clinically & biochem euthyroid; TSH= 0.59    GI- Divertics, Polyps> hx diverticulitis in 2008- followed by DrPerry; last colonoscopy 4/13 showed severe diverticulosis w/o inflamm or polyps seen...    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 Hg= 14.9    DJD, Osteoporosis> on Ca, MVI, Glucosamine; she uses OTC analgesic meds as needed; see BMDs below- on Bisphos drug holiday since 7/11 & due for f/u BMD...    Memory Loss> she is c/o difficulty w/ name, dates, etc; MMSE is abnormal & eval by [name] 4/14, DrPenumalli; felt to have mild cognitive impairment vs normal aging, did not rec starting meds... Exam today w/ 0/3 recall after distraction, start Aricept trial.    Anxiety> no requiring meds... We reviewed prob list, meds, xrays and labs> see below for updates >> given TDAP today...  CXR 1/15 showed heart at upper lim of norm, lungs clear (mild dev trachea to right c/w enlarged left lobe of thyroid- no change)...  EKG 1/15 showed SBrady, rate44, NSSTTW changes...   LABS 1/15:  FLP- at goals on Simva20;  Chems- wnl;  CBC- wnl;  TSH=0.59;  VitD=54... NOTE:  She wants to try ARICEPT10 start 1/2 => 1 tab daily as trial; Pulse is low at 48 & we decided to stop ToprolXL & replace it w/ VALSARTAN160/d...  ~  February 04, 2014:  79mo ROV & [name] has several minor somatic complaints but overall stable & feels well; our scale shows 13# wt loss to 132# today- she states good appetite, eating satis, "I get lots of exercise playng pickle ball" she says;  She notes that feet/ toes are cold (no pain, no sores), unusual feeling in legs at night, sweats alot, runny nose, etc;  She reallly dosen't want more meds but will try Astepro for the rhinorrhea...      Last OV we stopped her Metoprolol due to bradycardia & substituted Diovan160 for  her BP> now on this +CalanSR240 & HCT25; BP= 120/60 & denies HA, CP, palpit, SOB, edema, etc...     She remains on Simva20 w/ good control of Lipids...    She remains on Aricept10 & feels that her memory is stable, denies deterioration...  We reviewed prob list, meds, xrays and labs> see below for updates >> MEDS refuilled per request...   LABS, Mammogram, CXR, BMD, EKG> all reviewed ...   ~  August 10, 2014:  54mo ROV & it took quite a bit of time getting her med list straight today- she did  not know her meds, her card w/ meds written on it was old & out of date, Epic meds were not up to date either & we called Pharm- pt is reminded to bring all med bottles to every office visit... She tells me that [employer] wants her to get a hearing test & we will refer to ENT office audiologist... She has mult minor somatic complaints- runny nose, some numbness intermit in legs/ twitching, pain in neck muscles, stools are very soft, some urgency; we discussed antihist prn, apply heat to muscles, incr fiber, consider Neuro eval for neuropathy=> no new meds at this point...    Macular Degen> followed by DrHecker & DrRankin; she is getting shot Qmo or so for the MD & sched for bilat cat surg soon...    HBP> on ASA81, Valsartan160, Verap240, HCT25; BP= 128/76 & she denies HA, CP, palpit, dizzy, SOB, edema, etc...    Hyperlipid> on Simva20; FLP 2/16 show TChol 161, TG 107, HDL 61, LDL 79; continue same meds...    Hx Impaired FBS> Hx BS in the 130 range prev, but all readings ~100 x yrs now;  Labs 2/16 showed BS=99    Hypothy> hx multinod thyroid x yrs; had thyroidectomy 1990 by DrWeatherly; she weaned off Thyroid hormone yrs ago & has remained clinically & biochem euthyroid; Labs 2/16 showed TSH= 0.76    GI- Divertics, Polyps> hx diverticulitis in 2008- followed by DrPerry; last colonoscopy 4/13 showed severe diverticulosis w/o inflamm or polyps seen...    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 Labs 1/15 showed Hg= 14.7 & LFTs are WNL.    DJD, Osteoporosis> on Ca, MVI, Glucosamine; she uses OTC analgesic meds as needed; see BMDs below- on Bisphos drug holiday since 7/11 & due for f/u BMD...    Memory Loss> eval by [facility] 4/14, DrPenumalli; felt to have mild cognitive impairment vs normal aging, then w/ progressive impairment & they have her on Donepezil10 & Namenda10Bid now... We reviewed prob list, meds, xrays and labs> see below for updates >>   LABS 2/16:  FLP- at goals on Simva20;  Chems- wnl;  CBC- wnl;  TSH=0.76...  ~  February 08, 2015:  26mo ROV & Cynthia Mathis has a list of complaints today> runny nose (offered Zyrtek & Astelin but she doesn't want more meds), cold feet, hot & sweaty, thirsty, right knee pain, stepped on a hornets nest w/ 12 stings and reaction; she also reported an episode of syncope while visiting daugh in [place]- she was taken to daughter's doctor & reports that everything checked out OK- we do not have the data... She remains very active & plays "pickle ball" everyday.    Macular Degen> followed by DrHecker & DrRankin; she is getting shot Qmo or so for the MD & sched for bilat cat surg soon...    Hearing Loss> she saw DrGore, ENT w/ audiology showing bilat SNHL...    HBP> on ASA81, Valsartan160, Verap240, HCT25; BP= 132/74 & she denies HA, CP, palpit, dizzy, SOB, edema, etc...    Hyperlipid> on Simva20; FLP 2/16 show TChol 161, TG 107, HDL 61, LDL 79; continue same meds...    Hx Impaired FBS> Hx BS in the 130 range prev, but all readings ~100 x yrs now;  Labs 2/16 showed BS=99    Hypothy> hx multinod thyroid x yrs; had thyroidectomy 1990 by DrWeatherly; she weaned off Thyroid hormone yrs ago & has  remained clinically & biochem euthyroid; Labs 2/16 showed TSH= 0.76    GI- Divertics, Polyps> hx diverticulitis in 2008- followed by DrPerry; last colonoscopy 4/13 showed severe diverticulosis w/o inflamm or  polyps seen...    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 Labs 1/15 showed Hg= 14.7 & LFTs are WNL.    DJD, Osteoporosis> on Ca, MVI, Glucosamine; she uses OTC analgesic meds as needed; see BMDs below- on Bisphos drug holiday since 7/11 & due for f/u BMD...    Memory Loss> eval by [facility] 4/14, DrPenumalli; felt to have mild cognitive impairment vs normal aging, then w/ progressive impairment & they have her on Donepezil10 & Namenda10Bid now... We reviewed prob list, meds, xrays and labs>>  IMP/PLAN >>  Cynthia Mathis appears stable, but has mult somatic complaints, meanwhile she does not want any new maeds; she will request records from [place] in [place] & bring these to [place] when avail; continue same meds for now & ROV 47mo...           Problem List:  MACULAR DEGENERATION (ICD-362.50) - eval by DrHecker w/ foveal telangiectasia, macular drusen, macular degeneration, and cataracts- being followed regularly... vision is preserved so far... ~  11/11:  f/u DrHecker- stable age related mac degen, no retinopathy. ~  11/12:  f/u DrHecker- has advanced bilat AMD, sent to [facility], we don't have his notes... ~  She has been getting shots in the eyes for macular degen, we do not have notes from [facility]... ~  2/16: she tells me that she continues to receive the shots; she is also sched for bilat cat surg by DrHecker...  HYPERTENSION (ICD-401.9) >>  ~  on VERAPAMIL 240mg /d,  TOPROL-XL 25mg /d,  HCTZ 25mg /d... Prev on Lisinopril but this was stopped due to throat clearing tic & mild cough, plus K=6.0 at the time (all improved off the ACE)... ~  8/10: borderline BP here and she will monitor at home carefully & call if not <150/90. ~  7/11: she reports that she incr the Lisinopril to 1&1/2 tabs due to BP~150+, now better... ~  7/12:  BP stable on Verap240 & Lisinopril 30mg /d> BP= 140/68 today> denies HA, fatigue, visual  changes, CP, palipit, dizziness, syncope, dyspnea, edema, etc... ~  1/13:  BP= 160/90 (didn't take med today); states BP are ok at home; continue same meds + low sodium etc... ~  CXR 1/13 showed norm heart size, clear lungs, mild thor spondylosis, NAD.[name]. ~  7/13:  BP= 152/82 & she notes some troublesome throat clearing & mild cough; we decided to STOP the ACE & switch to LOSARTAN 100mg /d, monitor her symptoms and her blood presure... ~  CXR 9/13 showed calcif Ao, clear lungs, suspect left thyroid enlargement, DJD in Tspine... ~  EKG 10/13 showed SBrady, rate58, wnl, NAD... ~  11/13: she had long convoluted hx of BP med changes- see above- now on Verap240, MetopER25, HCT25, & BP= 126/72; we discussed options but she is content to continue these same meds for now & watch BP... ~  3/14: on ASA81, Metop25, Verap240, HCT25; BP= 140/80 & she denies HA, CP, palpit, dizzy, SOB, edema, etc. ~  1/15: on ASA81, Metop25, Verap240, HCT25; BP= 140/82 & she remains essen asymptomatic; she is bradycardic & we decided to stop ToprolXL & replace it w/ [id number] ~  CXR 1/15 showed heart at upper lim of norm, mild dev of trach to right, clear lungs, NAD.Marland KitchenMarland Kitchen  ~  EKG 1/15 showed SBrady, rate44, NSSTTWA, NAD.[name]. ~  7/15: on ASA81, Diovan160, Verap240, HCT25; BP= 120/60 & she is essentially asymptomatic, pulse=60, continue same meds... ~  2/16: on same meds & BP= 128/76, she remains asymptomatic...  DYSLIPIDEMIA (ICD-272.4) - on ZOCOR 20mg /d... plus doing well on diet... ~  [place] 6/09 showed TChol 144, TG 140, HDL 39, LDL 77... continue same. ~  FLP 2/10 showed TChol 164, TG 77, HDL 54, LDL 95 ~  FLP 8/10 showed TChol 138, TG 51, HDL 48, LDL 80 ~  FLP 1/11 showed TChol 155, TG 100, HDL 48, LDL 87 ~  FLP 7/11 showed TChol 158, TG 74, HDL 55, LDL 89 ~  FLP 1/12 showed TChol 155, TG 101, HDL 50, LDL 85 ~  FLP 1/13 on Simva20 showed TChol 169, TG 118, HDL 56, LDL 89  ~  FLP 1/15 on Simva20 showed TChol 155, TG 122, HDL 50,  LDL 81 ~  FLP 2/16 on Simva20 showed TChol 161, TG 107, HDL 61, LDL 79; continue same meds.  Hx Impaired Fasting Glucose >> on diet alone... FBS @ home betw 120-130 recently... she is on a good diet and weight is down at 139# today (63"  BMI= 24)... ~  labs 3/09 showed FBS= 119... subseq HgA1c 5/09 was 6.4... diet Rx. ~  labs 8/09 showed BS= 106, HgA1c= 5.9.Marland KitchenMarland Kitchen rec- same. ~  labs 2/10 showed BS= 102, A1c= 5.9 ~  labs 1/11 showed BS= 98 ~  labs 1/12 showed BS= 95 ~  Labs 1/13 showed BS= 101 ~  Labs 3/14 showed BS=106, A1c=6.2 ~  Labs 1/15 showed BS= 103 ~  Labs 2/16 showed FBS= 99  HYPOTHYROIDISM (ICD-244.9) - off Synthroid now...  clinically & biochemically euthyroid... remote hx of overactive thyroid many yrs ago in [place] (we don't have records)... she had an eval by DrKohut in 1987 w/ familial multinodular goiter w/ neg FNA... started on Rx but goiter enlarged- had thyroidectomy 1990 by DrWeatherly... ~  labs 3/09 showed TSH = 0.35 (0.35-5.5)... we have slowly decr her dose to 63mcg/d... ~  labs 8/09 showed TSH= 0.33... rec- decr the Synth50 to 1/2 tab daily. ~  labs 2/10 showed =TSH= 0.54 and Synthroid stopped... ~  labs 4/10 off med showed TSH= 0.59 ~  labs 8/10 showed TSH= 0.60 ~  labs 1/11 showed TSH= 0.60 ~  labs 7/11 showed TSH= 0.56 ~  labs 1/12 showed TSH= 0.60 ~  Labs 1/13 showed TSH= 0.58 ~  Labs 3/14 showed TSH= 0.37 ~  Labs 1/15 showed TSH= 0.59 ~  Labs 2/16 showed TSH= 0.76  DIVERTICULOSIS OF COLON (ICD-562.10) COLONIC POLYPS (ICD-211.3) - followed by DrPerry... bout of diverticulitis in Mar08... ~  last colonoscopy 3/08 showed divertics, 75mm polyp, hems... path=hyperplastic, f/u planned 65yrs. ~  F/u colonoscopy 4/13 by DrPerry showed severe diverticulosis w/o inflamm and w/o polyps etc...  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  HEMATURIA, HX OF (ICD-V13.09) - eval 4/08 by DrGrapey w/ neg Sonar, Cysto, cytology...  GYN >> DrKRichardson, [facility]ammograms [facility] (NEG- 4/15 study)...  DEGENERATIVE JOINT DISEASE (ICD-715.90) - c/o discomfort in her shoulders and knees... uses OTC meds as needed.  Hx of OSTEOPOROSIS (ICD-733.00) - on Fosamax 70/wk thru 7/11, Caltrate 1/d, MVI, Vit D 1000/d... ~  initial BMD 10/01 showed  TScores -0.2  to -3.2 in the spine...  ~  f/u studies w/ steady improvement on Fosamax therapy... ~  BMD 3/09 w/ TScores -0.1 to -1.7 in the spine... continue Rx. ~  BMD 7/11 showed TScores -2.0 Spine, and -0.3 in right FemNeck... we opted for Bisphos drug holiday to start now. ~  Labs 1/13 showed Vit D level = 48, continue same... ~  BMD 2/15 showed WNL Tscores; rec to continue calcium, MVI, VitD, wt bearing exercise...  PROGRESSIVE MEMORY LOSS >>  ~  MMSE 5/09 by Doran Heater, NP was 29/30... Reassured. ~  3/14: she presented w/ c/o worsening memory & MMSE has diminished- refer to [facility] for eval & rx... ~  Neuro eval 4/14 by DrPenumalli> mild cognitive impairment vs norm aging, he did not rec starting meds... ~  CT Brain 4/14 showed mild atrophy & sm vessel dis ~  She continues to f/u w/ Neurology periodically... ~  1/15: ROV here & husb more concerned re her memory, recall was 0/3 after distraction; husb has apparently done well on Aricept & she wants trial, start ARICEPT5=>10 daily... ~  She has been followed by DrPenumalli & they added Namenda 10Bid to her Aricept10...  ANXIETY (ICD-300.00) - prev on Alpraz Prn but hasn't used in yrs...  DERMATOLOGY >> LIPOMA (ICD-214.9) - notes large lipoma in RLQ/ rt flank area... some discomfort noted... seen by DrLeone in the past w/o surg... she saw DrCornett, CCS, 1/11 for second opinion & she will decide re: excision... ~  5/13:  She had Basal cell ca removed from the left side of her  nose, w/ subseq deeper margins & reports everything is OK... ~  7/13:  She is concerned that the lipoma may be growing & she requests f/u DrCornett for excision... ~  10/13:  Lipoma removed by DrCornett in outpt surg 04/17/12...  SHINGLES - remote hx of right T8-9 shingles in 1986...   Past Surgical History  Procedure Laterality Date  . Thyroidectomy  1990    [name]  . Appendectomy  1947  . Colonoscopy    . Mass excision  04/17/2012    Procedure: EXCISION MASS;  Surgeon: [name], MD;  Location: [facility];  Service: General;  Laterality: N/A;    Outpatient Encounter Prescriptions as of 02/08/2015  Medication Sig  . aspirin 81 MG tablet Take 81 mg by mouth daily.  . Calcium Carbonate-Vitamin D (CALCIUM 600+D) 600-400 MG-UNIT per tablet Take 1 tablet by mouth daily.  . Cyanocobalamin (VITAMIN B 12 PO) Take 1 tablet by mouth daily.  [name] donepezil (ARICEPT) 10 MG tablet Take 1 tablet (10 mg total) by mouth at bedtime.  . fish oil-omega-3 fatty acids 1000 MG capsule Take 1 g by mouth daily.  [name] glucosamine-chondroitin 500-400 MG tablet Take 1 tablet by mouth 3 (three) times daily.  . hydrochlorothiazide (HYDRODIURIL) 25 MG tablet Take 1 tablet (25 mg total) by mouth daily.  . memantine (NAMENDA) 10 MG tablet Take 1 tablet (10 mg total) by mouth 2 (two) times daily. (Patient taking differently: Take 10 mg by mouth daily. )  . Multiple Vitamins-Minerals (MULTIVITAMIN & MINERAL PO) Take 1 tablet by mouth daily.  . simvastatin (ZOCOR) 20 MG tablet TAKE 1 TABLET (20 MG TOTAL) BY MOUTH AT BEDTIME.  . valsartan (DIOVAN) 160 MG tablet TAKE 1 TABLET (160 MG TOTAL) BY MOUTH DAILY.  . verapamil (CALAN-SR) 240 MG CR tablet TAKE 1 TABLET (240 MG TOTAL)  BY MOUTH AT BEDTIME.  . [  DISCONTINUED] docusate sodium (COLACE) 100 MG capsule Take 1 capsule (100 mg total) by mouth every 12 (twelve) hours. (Patient not taking: Reported on 02/08/2015)  . [DISCONTINUED] hydrocortisone  (ANUSOL-HC) 25 MG suppository Place 1 suppository (25 mg total) rectally 2 (two) times daily. (Patient not taking: Reported on 02/08/2015)  . [DISCONTINUED] Hyoscyamine Sulfate 0.375 MG TBCR Take 1 tablet (0.375 mg total) by mouth 2 (two) times daily. (Patient not taking: Reported on 02/08/2015)   No facility-administered encounter medications on file as of 02/08/2015.    Allergies  Allergen Reactions  . Epinephrine Palpitations    REACTION: heart racing    Current Medications, Allergies, Past Medical History, Past Surgical History, Family History, and Social History were reviewed in [employer] record.    Review of Systems        See HPI - all other systems neg except as noted...  The patient complains of dyspnea on exertion.  The patient denies anorexia, fever, weight loss, weight gain, vision loss, decreased hearing, hoarseness, chest pain, syncope, peripheral edema, prolonged cough, headaches, hemoptysis, abdominal pain, melena, hematochezia, severe indigestion/heartburn, hematuria, incontinence, muscle weakness, suspicious skin lesions, transient blindness, difficulty walking, depression, unusual weight change, abnormal bleeding, enlarged lymph nodes, and angioedema.   Objective:   Physical Exam    WD, WN, 81 y/o WF in NAD... VITAL SIGNS:  Reviewed... GENERAL:  Alert & oriented; pleasant & cooperative... HEENT:  [place]/AT, EOM-wnl, PERRLA, EACs-clear, TMs-wnl, NOSE-clear, THROAT-clear & wnl. NECK:  Supple w/ fair ROM; no JVD; normal carotid impulses w/o bruits; scar of prev thyroid surg- no nodules or goiter; no lymphadenopathy. CHEST:  Clear to P & A; without wheezes/ rales/ or rhonchi. HEART:  Regular Rhythm; without murmurs/ rubs/ or gallops. ABDOMEN:  Soft & nontender; normal bowel sounds; no organomegaly or masses detected. EXT: without deformities, mild arthritic changes; no varicose veins/ venous insuffic/ or edema. prob heel spurs w/ some plantar fascia  pain> advised soaks & stretching... NEURO:  CN's intact;  no focal neuro deficits... DERM:  Scar in right flank after lipoma excision... no other lesions noted...  MMSE 09/17/12>  She did not know the Pres ("I can see him, a black guy"), VP, or Gov;  Didn't know prev pres etc;  Recall 3/3 immed, but only 1/3 after distraction;  Serial 7s & WORLD backwards were fluent; proverbs were abstracted normally...  RADIOLOGY DATA:  Reviewed in the EPIC EMR & discussed w/ the patient...  LABORATORY DATA:  Reviewed in the EPIC EMR & discussed w/ the patient...   Assessment & Plan:    Macular Degen>  Followed by DrHecker/ [facility] & getting MD shots...  HBP>  on Verap, Diovan, HCT- along w/ no salt; & BP appears to be well controlled & pulse improved (60/min)  DYSLIPIDEMIA>  Stable on diet & Simva20...  Borderline DM>  Hx BS in the 120-130 range yrs ago but stable ~100 for the last 3+yrs on diet alone...  Hx Hypothyroidism>  She weaned off Synthroid several yrs ago & TSH has remained normal, clinically euthyroid as well...  GI>  Divertics, Colon Polyps>  Stable & up to date w/ f/u colon 4/13 from DrPerry==> divertics, no polyps...  LIPOMA>  Removed 10/13 from RLQ area by DrCornett...  DJD>  She notes some right knee discomfort playing "pickle ball" & is advised to wear knee brace, take Aleve prn (she declines further eval or prescription meds)...  Osteoporosis>  Hx BMD in 2001 w/ TScore =3.2 in Spine; Rx'd Fosamax for  75yrs w/ steady improvement & on Bisphos drug holiday since 7/11 & BMD is 2015 was WNL- continue supplements w/ Calcium, MVI, Vit D...  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  MEMORY LOSS>  Followed by DrPenumalli on Aricept & Namenda...  Other medical problems as listed...   Patient's Medications  New Prescriptions   No medications on file  Previous Medications   ASPIRIN 81 MG  TABLET    Take 81 mg by mouth daily.   CALCIUM CARBONATE-VITAMIN D (CALCIUM 600+D) 600-400 MG-UNIT PER TABLET    Take 1 tablet by mouth daily.   CYANOCOBALAMIN (VITAMIN B 12 PO)    Take 1 tablet by mouth daily.   DONEPEZIL (ARICEPT) 10 MG TABLET    Take 1 tablet (10 mg total) by mouth at bedtime.   FISH OIL-OMEGA-3 FATTY ACIDS 1000 MG CAPSULE    Take 1 g by mouth daily.   GLUCOSAMINE-CHONDROITIN 500-400 MG TABLET    Take 1 tablet by mouth 3 (three) times daily.   HYDROCHLOROTHIAZIDE (HYDRODIURIL) 25 MG TABLET    Take 1 tablet (25 mg total) by mouth daily.   MEMANTINE (NAMENDA) 10 MG TABLET    Take 1 tablet (10 mg total) by mouth 2 (two) times daily.   MULTIPLE VITAMINS-MINERALS (MULTIVITAMIN & MINERAL PO)    Take 1 tablet by mouth daily.   SIMVASTATIN (ZOCOR) 20 MG TABLET    TAKE 1 TABLET (20 MG TOTAL) BY MOUTH AT BEDTIME.   VALSARTAN (DIOVAN) 160 MG TABLET    TAKE 1 TABLET (160 MG TOTAL) BY MOUTH DAILY.   VERAPAMIL (CALAN-SR) 240 MG CR TABLET    TAKE 1 TABLET (240 MG TOTAL)  BY MOUTH AT BEDTIME.  Modified Medications   No medications on file  Discontinued Medications   DOCUSATE SODIUM (COLACE) 100 MG CAPSULE    Take 1 capsule (100 mg total) by mouth every 12 (twelve) hours.   HYDROCORTISONE (ANUSOL-HC) 25 MG SUPPOSITORY    Place 1 suppository (25 mg total) rectally 2 (two) times daily.   HYOSCYAMINE SULFATE 0.375 MG TBCR    Take 1 tablet (0.375 mg total) by mouth 2 (two) times daily.

## 2015-03-17 ENCOUNTER — Telehealth: Payer: Self-pay | Admitting: Pulmonary Disease

## 2015-03-17 ENCOUNTER — Other Ambulatory Visit: Payer: Self-pay | Admitting: Pulmonary Disease

## 2015-03-17 MED ORDER — SIMVASTATIN 20 MG PO TABS: ORAL_TABLET | ORAL | Status: DC

## 2015-03-17 NOTE — Telephone Encounter (Signed)
Cynthia Mathis sent refill this am  ATC pt, NA and no option to leave msg, [facility]

## 2015-03-17 NOTE — Telephone Encounter (Signed)
Received paper refill request from [facility] to refill pt's simvastatin Refill sent to pharmacy with additional refills  Nothing further is needed at this time.

## 2015-03-17 NOTE — Telephone Encounter (Signed)
Spoke with Mariane Masters, pt's spouse and notified that rx for simvastatin was sent this am  He verbalized understanding and nothing further needed

## 2015-03-17 NOTE — Telephone Encounter (Signed)
ATC, still NA and no option to leave msg

## 2015-03-22 DIAGNOSIS — Z01419 Encounter for gynecological examination (general) (routine) without abnormal findings: Secondary | ICD-10-CM | POA: Diagnosis not present

## 2015-03-22 DIAGNOSIS — R319 Hematuria, unspecified: Secondary | ICD-10-CM | POA: Diagnosis not present

## 2015-03-22 DIAGNOSIS — Z1389 Encounter for screening for other disorder: Secondary | ICD-10-CM | POA: Diagnosis not present

## 2015-03-22 DIAGNOSIS — Z6826 Body mass index (BMI) 26.0-26.9, adult: Secondary | ICD-10-CM | POA: Diagnosis not present

## 2015-03-24 ENCOUNTER — Other Ambulatory Visit: Payer: Self-pay | Admitting: Pulmonary Disease

## 2015-03-24 MED ORDER — HYDROCHLOROTHIAZIDE 25 MG PO TABS: 25.0000 mg | ORAL_TABLET | Freq: Every day | ORAL | Status: DC

## 2015-04-21 DIAGNOSIS — H353211 Exudative age-related macular degeneration, right eye, with active choroidal neovascularization: Secondary | ICD-10-CM | POA: Diagnosis not present

## 2015-04-22 DIAGNOSIS — H25011 Cortical age-related cataract, right eye: Secondary | ICD-10-CM | POA: Diagnosis not present

## 2015-04-22 DIAGNOSIS — H353130 Nonexudative age-related macular degeneration, bilateral, stage unspecified: Secondary | ICD-10-CM | POA: Diagnosis not present

## 2015-04-22 DIAGNOSIS — H40033 Anatomical narrow angle, bilateral: Secondary | ICD-10-CM | POA: Diagnosis not present

## 2015-04-22 DIAGNOSIS — H2511 Age-related nuclear cataract, right eye: Secondary | ICD-10-CM | POA: Diagnosis not present

## 2015-04-22 DIAGNOSIS — H353212 Exudative age-related macular degeneration, right eye, with inactive choroidal neovascularization: Secondary | ICD-10-CM | POA: Diagnosis not present

## 2015-04-22 LAB — HM DIABETES EYE EXAM

## 2015-05-06 ENCOUNTER — Encounter: Payer: Self-pay | Admitting: Pulmonary Disease

## 2015-05-06 ENCOUNTER — Ambulatory Visit (INDEPENDENT_AMBULATORY_CARE_PROVIDER_SITE_OTHER): Payer: Medicare Other | Admitting: Pulmonary Disease

## 2015-05-06 ENCOUNTER — Ambulatory Visit (INDEPENDENT_AMBULATORY_CARE_PROVIDER_SITE_OTHER)
Admission: RE | Admit: 2015-05-06 | Discharge: 2015-05-06 | Disposition: A | Discharge status: Home / Self Care | Payer: Medicare Other | Source: Ambulatory Visit | Attending: Pulmonary Disease | Admitting: Pulmonary Disease

## 2015-05-06 VITALS — BP 120/72 | HR 60 | Temp 98.2°F | Wt 137.4 lb

## 2015-05-06 DIAGNOSIS — M1711 Unilateral primary osteoarthritis, right knee: Secondary | ICD-10-CM

## 2015-05-06 DIAGNOSIS — M545 Low back pain, unspecified: Secondary | ICD-10-CM

## 2015-05-06 DIAGNOSIS — M47816 Spondylosis without myelopathy or radiculopathy, lumbar region: Secondary | ICD-10-CM | POA: Diagnosis not present

## 2015-05-06 MED ORDER — TRAMADOL HCL 50 MG PO TABS: 50.0000 mg | ORAL_TABLET | Freq: Three times a day (TID) | ORAL | Status: DC | PRN

## 2015-05-06 NOTE — Patient Instructions (Signed)
Today we updated your med list in our EPIC system...    Continue your current medications the same...  Today we assessed your right knee 7 lower back...    We are obtainin g XRays of both areas & we will notify you of the results...  For your right knee>    Get a neoprene sleeve support at the Pharm & wear it when up and about...    Take the new TRAMADOL 50mg  withan extra strength Tylenol- up to 3 times daily as needed for the pain...  For your back>    Apply heat to the area- eg. Heating pad etc...    Use the Tramadol+Tylenol as above...    Gradually increase your abdominal musc exercises to strengthen those Abs...  The next step will be to refer you to an Ortho specialist if needed...  Call for any questions.Marland KitchenMarland Kitchen

## 2015-05-07 NOTE — Progress Notes (Signed)
Quick Note:  Called spoke with patient, advised of xray results / recs as stated by SN. Pt verbalized her understanding and denied any questions. ______

## 2015-05-09 ENCOUNTER — Encounter: Payer: Self-pay | Admitting: Pulmonary Disease

## 2015-05-09 NOTE — Progress Notes (Signed)
Subjective:    Patient ID: Cynthia Mathis, female    DOB: Mar 12, 1933, 81 y.o.   MRN: [id number]  HPI 81 y/o WF here for a follow up visit... she has mult med problems as noted below ...  ~  SEE PREV EPIC NOTES FOR OLDER DATA >>   ~  sees DrPerry for GI- divertics, polyps, hems & a bout of diverticulitis in Mar08...  ~  sees Dr Risa Grill Urology w/ microscopic hematuria and a neg eval 5/08... 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 ~  sees DrLLomax for Derm w/ alopecia- offered Rogaine but decided against it... ~  Sees DrPenumali for Neuro w/ memory loss, early stage dementia...   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  September 17, 2012:  concerned about her memory; husb has been under the care of DrLove for >93yrs w/ mild cognitive impairment  LABS 3/14:  Chems- wnl w/ BS=106, A1c=6.2;  TSH=0.37;  Sed=14...    January 23, 2013:  visit w/ DrPenumalli 3/14> her MMSE was 29/30 & he planned to check MRI Brain & B12 level, he did not rec starting meds yet (husb takes Razadyne & feels it helps him).  ADDENDUM 04/15/13 >> Cynthia Mathis called & wants off Simva20 after reading something about memory loss=> change to Atorva20 (she never did).  CXR 1/15 showed heart at upper lim of norm, lungs clear (mild dev trachea to right c/w enlarged left lobe of thyroid- no change).  EKG 1/15 showed SBrady, rate44, NSSTTW changes.  LABS 1/15:  FLP- at goals on Simva20;  Chems- wnl;  CBC- wnl;  TSH=0.59;  VitD=54.  NOTE:  She wants to try ARICEPT10 start 1/2 => 1 tab daily as trial; Pulse is low at 48 & we decided to stop ToprolXL & replace it w/ VALSARTAN160/d.  ~  February 04, 2014:  63mo ROV & Cynthia Mathis has several minor somatic complaints but overall stable & feels well; our scale shows 13# wt loss to 132# today- she states good appetite, eating satis, "I get lots of exercise playng pickle  ball" she says;  She notes that feet/ toes are cold (no pain, no sores), unusual feeling in legs at night, sweats alot, runny nose, etc;  She reallly dosen't want more meds but will try Astepro for the rhinorrhea...      Last OV we stopped her Metoprolol due to bradycardia & substituted Diovan160 for her BP> now on this +CalanSR240 & HCT25; BP= 120/60 & denies HA, CP, palpit, SOB, edema, etc...     She remains on Simva20 w/ good control of Lipids...    She remains on Aricept10 & feels that her memory is stable, denies deterioration...  We reviewed prob list, meds, xrays and labs> see below for updates >> MEDS refilled per request...   LABS, Mammogram, CXR, BMD, EKG> all reviewed ...   ~  August 10, 2014:  62mo ROV & it took quite a bit of time getting her med list straight today- she did not know her meds, her card w/ meds written on it was old & out of date, Epic meds were not up to date either & we called Pharm- Cynthia Mathis is reminded to bring all med bottles to every office visit... She tells me that [facility] wants her to get a hearing test & we will refer to ENT office audiologist... She has mult minor somatic complaints- runny nose,  some numbness intermit in legs/ twitching, pain in neck muscles, stools are very soft, some urgency; we discussed antihist prn, apply heat to muscles, incr fiber, consider Neuro eval for neuropathy=> no new meds at this point...    Macular Degen> followed by DrHecker & DrRankin; she is getting shot Qmo or so for the MD & sched for bilat cat surg soon...    HBP> on ASA81, Valsartan160, Verap240, HCT25; BP= 128/76 & she denies HA, CP, palpit, dizzy, SOB, edema, etc...    Hyperlipid> on Simva20; FLP 2/16 show TChol 161, TG 107, HDL 61, LDL 79; continue same meds...    Hx Impaired FBS> Hx BS in the 130 range prev, but all readings ~100 x yrs now;  Labs 2/16 showed BS=99    Hypothy> hx multinod thyroid x yrs; had thyroidectomy 1990 by DrWeatherly; she weaned off Thyroid hormone yrs ago &  has remained clinically & biochem euthyroid; Labs 2/16 showed TSH= 0.76    GI- Divertics, Polyps> hx diverticulitis in 2008- followed by DrPerry; last colonoscopy 4/13 showed severe diverticulosis w/o inflamm or polyps seen...    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 Labs 1/15 showed Hg= 14.7 & LFTs are WNL.    DJD, Osteoporosis> on Ca, MVI, Glucosamine; she uses OTC analgesic meds as needed; see BMDs below- on Bisphos drug holiday since 7/11 & due for f/u BMD...    Memory Loss> eval by [facility] 4/14, DrPenumalli; felt to have mild cognitive impairment vs normal aging, then w/ progressive impairment & they have her on Donepezil10 & Namenda10Bid now... We reviewed prob list, meds, xrays and labs> see below for updates >>   LABS 2/16:  FLP- at goals on Simva20;  Chems- wnl;  CBC- wnl;  TSH=0.76...  ~  February 08, 2015:  59mo ROV & Cynthia Mathis has a list of complaints today> runny nose (offered Zyrtek & Astelin but she doesn't want more meds), cold feet, hot & sweaty, thirsty, right knee pain, stepped on a hornets nest w/ 12 stings and reaction; she also reported an episode of syncope while visiting daugh in [place]- she was taken to daughter's doctor & reports that everything checked out OK- we do not have the data... She remains very active & plays "pickle ball" everyday.    Macular Degen> followed by DrHecker & DrRankin; she is getting shot Qmo or so for the MD & sched for bilat cat surg soon...    Hearing Loss> she saw DrGore, ENT w/ audiology showing bilat SNHL...    HBP> on ASA81, Valsartan160, Verap240, HCT25; BP= 132/74 & she denies HA, CP, palpit, dizzy, SOB, edema, etc...    Hyperlipid> on Simva20; FLP 2/16 show TChol 161, TG 107, HDL 61, LDL 79; continue same meds...    Hx Impaired FBS> Hx BS in the 130 range prev, but all readings ~100 x yrs now;  Labs 2/16 showed BS=99    Hypothy> hx multinod thyroid x  yrs; had thyroidectomy 1990 by DrWeatherly; she weaned off Thyroid hormone yrs ago & has remained clinically & biochem euthyroid; Labs 2/16 showed TSH= 0.76    GI- Divertics, Polyps> hx diverticulitis in 2008- followed by DrPerry; last colonoscopy 4/13 showed severe diverticulosis w/o inflamm or polyps seen...    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 Labs 1/15 showed Hg= 14.7 & LFTs are WNL.  DJD, Osteoporosis> on Ca, MVI, Glucosamine; she uses OTC analgesic meds as needed; see BMDs below- on Bisphos drug holiday since 7/11 & due for f/u BMD...    Memory Loss> eval by [facility] 4/14, DrPenumalli; felt to have mild cognitive impairment vs normal aging, then w/ progressive impairment & they have her on Donepezil10 & Namenda10Bid now... We reviewed prob list, meds, xrays and labs>>  IMP/PLAN >>  Cynthia Mathis appears stable, but has mult somatic complaints, meanwhile she does not want any new meds; she will request records from [facility] in [place] (nevetr received) & bring these to [place] when avail; continue same meds for now & ROV 61mo...  ~  May 06, 2015:  23mo ROV & add-on appt requested by Cynthia Mathis due to right knee pain and LBP for the last 1wk she says; no known trauma or injury, and she denies prev problems x occas soreness; she thinks that she may have had a shot in her knee once several yrs ago; as for the back pain- it is dull, in center of back w/o radiation...     EXAM shows Afeb, VSS, O2sat=97%;  HEENT- neg, mallampati1;  Chest- clear w/o w/r/r;  Heart- RR w/o m/r/g;  Abd- soft, nontender, neg;  Ext- +crepitus in right knee, w/o c/c/e;  Neuro- intact...   XRay right knee 05/06/15> osteoarthtitis w/ medial joint space compartment narrowing with milder patellofemoral joint space compartment narrowing. Marginal osteophytes project from all 3 compartments, mild spurring along the tibial spine, no joint effusion;  Bones are demineralized...  XRay LS spine 05/06/15>  Disc space narrowing at L2-3 with endplate sclerosis. Mild osteophytic changes are seen. Diffuse aortic calcifications are noted. NAD... IMP/PLAN>>  Cynthia Mathis is c/o 1 wk hx right knee & LBP; exam shows good ROM right knee w/ crepitation; XRays indicate degen arthritis; we discussed Rx w/ Tramadol+Tylenol, neoprene sleeve for knee and rest/ heat for back; I indicated next step is to refer to Ortho for shot if symptoms don't respond to Rx...            Problem List:  MACULAR DEGENERATION (ICD-362.50) - eval by DrHecker w/ foveal telangiectasia, macular drusen, macular degeneration, and cataracts- being followed regularly... vision is preserved so far... ~  11/11:  f/u DrHecker- stable age related mac degen, no retinopathy. ~  11/12:  f/u DrHecker- has advanced bilat AMD, sent to [facility], we don't have his notes... ~  She has been getting shots in the eyes for macular degen, we do not have notes from [place]... ~  2/16: she tells me that she continues to receive the shots; she is also sched for bilat cat surg by DrHecker...  HYPERTENSION (ICD-401.9) >>  ~  on VERAPAMIL 240mg /d,  TOPROL-XL 25mg /d,  HCTZ 25mg /d... Prev on Lisinopril but this was stopped due to throat clearing tic & mild cough, plus K=6.0 at the time (all improved off the ACE)... ~  8/10: borderline BP here and she will monitor at home carefully & call if not <150/90. ~  7/11: she reports that she incr the Lisinopril to 1&1/2 tabs due to BP~150+, now better... ~  7/12:  BP stable on Verap240 & Lisinopril 30mg /d> BP= 140/68 today> denies HA, fatigue, visual changes, CP, palipit, dizziness, syncope, dyspnea, edema, etc... ~  1/13:  BP= 160/90 (didn't take med today); states BP are ok at home; continue same meds + low sodium etc... ~  CXR 1/13 showed norm heart size, clear lungs, mild thor spondylosis, NAD.[name]. ~  7/13:  BP=  152/82 & she notes some troublesome throat clearing & mild  cough; we decided to STOP the ACE & switch to LOSARTAN 100mg /d, monitor her symptoms and her blood presure... ~  CXR 9/13 showed calcif Ao, clear lungs, suspect left thyroid enlargement, DJD in Tspine... ~  EKG 10/13 showed SBrady, rate58, wnl, NAD... ~  11/13: she had long convoluted hx of BP med changes- see above- now on Verap240, MetopER25, HCT25, & BP= 126/72; we discussed options but she is content to continue these same meds for now & watch BP... ~  3/14: on ASA81, Metop25, Verap240, HCT25; BP= 140/80 & she denies HA, CP, palpit, dizzy, SOB, edema, etc. ~  1/15: on ASA81, Metop25, Verap240, HCT25; BP= 140/82 & she remains essen asymptomatic; she is bradycardic & we decided to stop ToprolXL & replace it w/ [id number] ~  CXR 1/15 showed heart at upper lim of norm, mild dev of trach to right, clear lungs, NAD...  ~  EKG 1/15 showed SBrady, rate44, NSSTTWA, NAD.[name]. ~  7/15: on ASA81, Diovan160, Verap240, HCT25; BP= 120/60 & she is essentially asymptomatic, pulse=60, continue same meds... ~  2/16: on same meds & BP= 128/76, she remains asymptomatic...  DYSLIPIDEMIA (ICD-272.4) - on ZOCOR 20mg /d... plus doing well on diet... ~  [place] 6/09 showed TChol 144, TG 140, HDL 39, LDL 77... continue same. ~  FLP 2/10 showed TChol 164, TG 77, HDL 54, LDL 95 ~  FLP 8/10 showed TChol 138, TG 51, HDL 48, LDL 80 ~  FLP 1/11 showed TChol 155, TG 100, HDL 48, LDL 87 ~  FLP 7/11 showed TChol 158, TG 74, HDL 55, LDL 89 ~  FLP 1/12 showed TChol 155, TG 101, HDL 50, LDL 85 ~  FLP 1/13 on Simva20 showed TChol 169, TG 118, HDL 56, LDL 89  ~  FLP 1/15 on Simva20 showed TChol 155, TG 122, HDL 50, LDL 81 ~  FLP 2/16 on Simva20 showed TChol 161, TG 107, HDL 61, LDL 79; continue same meds.  Hx Impaired Fasting Glucose >> on diet alone... FBS @ home betw 120-130 recently... she is on a good diet and weight is down at 139# today (63"  BMI= 24)... ~  labs 3/09 showed FBS= 119... subseq HgA1c 5/09 was 6.4... diet Rx. ~  labs  8/09 showed BS= 106, HgA1c= 5.9.Marland KitchenMarland Kitchen rec- same. ~  labs 2/10 showed BS= 102, A1c= 5.9 ~  labs 1/11 showed BS= 98 ~  labs 1/12 showed BS= 95 ~  Labs 1/13 showed BS= 101 ~  Labs 3/14 showed BS=106, A1c=6.2 ~  Labs 1/15 showed BS= 103 ~  Labs 2/16 showed FBS= 99  HYPOTHYROIDISM (ICD-244.9) - off Synthroid now...  clinically & biochemically euthyroid... remote hx of overactive thyroid many yrs ago in [place] (we don't have records)... she had an eval by DrKohut in 1987 w/ familial multinodular goiter w/ neg FNA... started on Rx but goiter enlarged- had thyroidectomy 1990 by DrWeatherly... ~  labs 3/09 showed TSH = 0.35 (0.35-5.5)... we have slowly decr her dose to 65mcg/d... ~  labs 8/09 showed TSH= 0.33... rec- decr the Synth50 to 1/2 tab daily. ~  labs 2/10 showed =TSH= 0.54 and Synthroid stopped... ~  labs 4/10 off med showed TSH= 0.59 ~  labs 8/10 showed TSH= 0.60 ~  labs 1/11 showed TSH= 0.60 ~  labs 7/11 showed TSH= 0.56 ~  labs 1/12 showed TSH= 0.60 ~  Labs 1/13 showed TSH= 0.58 ~  Labs 3/14 showed TSH= 0.37 ~  Labs 1/15 showed TSH= 0.59 ~  Labs 2/16 showed TSH= 0.76  DIVERTICULOSIS OF COLON (ICD-562.10) COLONIC POLYPS (ICD-211.3) - followed by DrPerry... bout of diverticulitis in Mar08... ~  last colonoscopy 3/08 showed divertics, 48mm polyp, hems... path=hyperplastic, f/u planned 35yrs. ~  F/u colonoscopy 4/13 by DrPerry showed severe diverticulosis w/o inflamm and w/o polyps etc...  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  HEMATURIA, HX OF (ICD-V13.09) - eval 4/08 by DrGrapey w/ neg Sonar, Cysto, cytology...  GYN >> DrKRichardson, Mammograms [facility] (NEG- 4/15 study)...  DEGENERATIVE JOINT DISEASE (ICD-715.90) - c/o discomfort in her shoulders and knees... uses OTC meds as  needed. ~  04/2015:  She presented w/ 1wk hx right knee & LBP; exam showed crepitation in right knee but good ROM & SLR was neg; Rec to use Tramadol+Tylenol, neoprene support sleeve for knee & rest/heat/ exercises for back; we will refer to Ortho for shots if not responding...   Hx of OSTEOPOROSIS (ICD-733.00) - on Fosamax 70/wk thru 7/11, Caltrate 1/d, MVI, Vit D 1000/d... ~  initial BMD 10/01 showed TScores -0.2  to -3.2 in the spine...  ~  f/u studies w/ steady improvement on Fosamax therapy... ~  BMD 3/09 w/ TScores -0.1 to -1.7 in the spine... continue Rx. ~  BMD 7/11 showed TScores -2.0 Spine, and -0.3 in right FemNeck... we opted for Bisphos drug holiday to start now. ~  Labs 1/13 showed Vit D level = 48, continue same... ~  BMD 2/15 showed WNL Tscores; rec to continue calcium, MVI, VitD, wt bearing exercise...  PROGRESSIVE MEMORY LOSS >>  ~  MMSE 5/09 by Doran Heater, NP was 29/30... Reassured. ~  3/14: she presented w/ c/o worsening memory & MMSE has diminished- refer to [facility] for eval & rx... ~  Neuro eval 4/14 by DrPenumalli> mild cognitive impairment vs norm aging, he did not rec starting meds... ~  CT Brain 4/14 showed mild atrophy & sm vessel dis ~  She continues to f/u w/ Neurology periodically... ~  1/15: ROV here & husb more concerned re her memory, recall was 0/3 after distraction; husb has apparently done well on Aricept & she wants trial, start ARICEPT5=>10 daily... ~  She has been followed by DrPenumalli & they added Namenda 10Bid to her Aricept10...  ANXIETY (ICD-300.00) - prev on Alpraz Prn but hasn't used in yrs...  DERMATOLOGY >> LIPOMA (ICD-214.9) - notes large lipoma in RLQ/ rt flank area... some discomfort noted... seen by DrLeone in the past w/o surg... she saw DrCornett, CCS, 1/11 for second opinion & she will decide re: excision... ~  5/13:  She had Basal cell ca removed from the left side of her nose, w/ subseq deeper margins & reports everything is  OK... ~  7/13:  She is concerned that the lipoma may be growing & she requests f/u DrCornett for excision... ~  10/13:  Lipoma removed by DrCornett in outpt surg 04/17/12...  SHINGLES - remote hx of right T8-9 shingles in 1986...   Past Surgical History  Procedure Laterality Date  . Thyroidectomy  1990    [name]  . Appendectomy  1947  . Colonoscopy    . Mass excision  04/17/2012    Procedure: EXCISION MASS;  Surgeon: [name], MD;  Location: [facility];  Service:  General;  Laterality: N/A;    Outpatient Encounter Prescriptions as of 05/06/2015  Medication Sig  . aspirin 81 MG tablet Take 81 mg by mouth daily.  . Calcium Carbonate-Vitamin D (CALCIUM 600+D) 600-400 MG-UNIT per tablet Take 1 tablet by mouth daily.  . Cyanocobalamin (VITAMIN B 12 PO) Take 1 tablet by mouth daily.  [name] donepezil (ARICEPT) 10 MG tablet Take 1 tablet (10 mg total) by mouth at bedtime.  . fish oil-omega-3 fatty acids 1000 MG capsule Take 1 g by mouth daily.  [name] glucosamine-chondroitin 500-400 MG tablet Take 1 tablet by mouth 3 (three) times daily.  . hydrochlorothiazide (HYDRODIURIL) 25 MG tablet Take 1 tablet (25 mg total) by mouth daily.  . memantine (NAMENDA) 10 MG tablet Take 1 tablet (10 mg total) by mouth 2 (two) times daily. (Patient taking differently: Take 10 mg by mouth daily. )  . Multiple Vitamins-Minerals (MULTIVITAMIN & MINERAL PO) Take 1 tablet by mouth daily.  . simvastatin (ZOCOR) 20 MG tablet TAKE 1 TABLET (20 MG TOTAL) BY MOUTH AT BEDTIME.  . valsartan (DIOVAN) 160 MG tablet TAKE 1 TABLET (160 MG TOTAL) BY MOUTH DAILY.  . verapamil (CALAN-SR) 240 MG CR tablet TAKE 1 TABLET (240 MG TOTAL)  BY MOUTH AT BEDTIME.  . traMADol (ULTRAM) 50 MG tablet Take 1 tablet (50 mg total) by mouth 3 (three) times daily as needed.   No facility-administered encounter medications on file as of 05/06/2015.    Allergies  Allergen Reactions  . Epinephrine Palpitations    REACTION:  heart racing    Current Medications, Allergies, Past Medical History, Past Surgical History, Family History, and Social History were reviewed in [employer] record.    Review of Systems        See HPI - all other systems neg except as noted...  The patient complains of dyspnea on exertion.  The patient denies anorexia, fever, weight loss, weight gain, vision loss, decreased hearing, hoarseness, chest pain, syncope, peripheral edema, prolonged cough, headaches, hemoptysis, abdominal pain, melena, hematochezia, severe indigestion/heartburn, hematuria, incontinence, muscle weakness, suspicious skin lesions, transient blindness, difficulty walking, depression, unusual weight change, abnormal bleeding, enlarged lymph nodes, and angioedema.   Objective:   Physical Exam    WD, WN, 81 y/o WF in NAD... VITAL SIGNS:  Reviewed... GENERAL:  Alert & oriented; pleasant & cooperative... HEENT:  [place]/AT, EOM-wnl, PERRLA, EACs-clear, TMs-wnl, NOSE-clear, THROAT-clear & wnl. NECK:  Supple w/ fair ROM; no JVD; normal carotid impulses w/o bruits; scar of prev thyroid surg- no nodules or goiter; no lymphadenopathy. CHEST:  Clear to P & A; without wheezes/ rales/ or rhonchi. HEART:  Regular Rhythm; without murmurs/ rubs/ or gallops. ABDOMEN:  Soft & nontender; normal bowel sounds; no organomegaly or masses detected. EXT: without deformities, mild arthritic changes; no varicose veins/ venous insuffic/ or edema. prob heel spurs w/ some plantar fascia pain> advised soaks & stretching... NEURO:  CN's intact;  no focal neuro deficits... DERM:  Scar in right flank after lipoma excision... no other lesions noted...  MMSE 09/17/12>  She did not know the Pres ("I can see him, a black guy"), VP, or Gov;  Didn't know prev pres etc;  Recall 3/3 immed, but only 1/3 after distraction;  Serial 7s & WORLD backwards were fluent; proverbs were abstracted normally...  RADIOLOGY DATA:  Reviewed in the EPIC  EMR & discussed w/ the patient...  LABORATORY DATA:  Reviewed in the EPIC EMR & discussed w/ the patient...   Assessment & Plan:  Macular Degen>  Followed by DrHecker/ [place] & getting MD shots...  HBP>  on Verap, Diovan, HCT- along w/ no salt; & BP appears to be well controlled & pulse improved (60/min)  DYSLIPIDEMIA>  Stable on diet & Simva20...  Borderline DM>  Hx BS in the 120-130 range yrs ago but stable ~100 for the last 3+yrs on diet alone...  Hx Hypothyroidism>  She weaned off Synthroid several yrs ago & TSH has remained normal, clinically euthyroid as well...  GI>  Divertics, Colon Polyps>  Stable & up to date w/ f/u colon 4/13 from DrPerry==> divertics, no polyps...  LIPOMA>  Removed 10/13 from RLQ area by DrCornett...  DJD>  She notes some right knee discomfort playing "pickle ball" & is advised to wear knee brace, take Aleve prn (she declines further eval or prescription meds)... 10/16>  1 wk hx right knee & LBP; exam shows good ROM right knee w/ crepitation; XRays indicate degen arthritis; we discussed Rx w/ Tramadol+Tylenol, neoprene sleeve for knee and rest/ heat for back; I indicated next step is to refer to Ortho for shot if symptoms don't respond to Rx.  Osteoporosis>  Hx BMD in 2001 w/ TScore =3.2 in Spine; Rx'd Fosamax for 35yrs w/ steady improvement & on Bisphos drug holiday since 7/11 & BMD is 2015 was WNL- continue supplements w/ Calcium, MVI, Vit D...  HEMOCHROMATOSIS Gene carrier >> daugh diagnoses w/ hemochromatosis, on phlebotomy; Cynthia Mathis is, as expected, heterozygous for the C282Y gene...  MEMORY LOSS>  Followed by DrPenumalli on Aricept & Namenda...  Other medical problems as listed...   Patient's Medications  New Prescriptions   TRAMADOL (ULTRAM) 50 MG TABLET    Take 1 tablet (50 mg total) by mouth 3 (three) times daily as needed.  Previous Medications   ASPIRIN 81 MG TABLET    Take 81 mg by mouth daily.   CALCIUM CARBONATE-VITAMIN D (CALCIUM 600+D)  600-400 MG-UNIT PER TABLET    Take 1 tablet by mouth daily.   CYANOCOBALAMIN (VITAMIN B 12 PO)    Take 1 tablet by mouth daily.   DONEPEZIL (ARICEPT) 10 MG TABLET    Take 1 tablet (10 mg total) by mouth at bedtime.   FISH OIL-OMEGA-3 FATTY ACIDS 1000 MG CAPSULE    Take 1 g by mouth daily.   GLUCOSAMINE-CHONDROITIN 500-400 MG TABLET    Take 1 tablet by mouth 3 (three) times daily.   HYDROCHLOROTHIAZIDE (HYDRODIURIL) 25 MG TABLET    Take 1 tablet (25 mg total) by mouth daily.   MEMANTINE (NAMENDA) 10 MG TABLET    Take 1 tablet (10 mg total) by mouth 2 (two) times daily.   MULTIPLE VITAMINS-MINERALS (MULTIVITAMIN & MINERAL PO)    Take 1 tablet by mouth daily.   SIMVASTATIN (ZOCOR) 20 MG TABLET    TAKE 1 TABLET (20 MG TOTAL) BY MOUTH AT BEDTIME.   VALSARTAN (DIOVAN) 160 MG TABLET    TAKE 1 TABLET (160 MG TOTAL) BY MOUTH DAILY.   VERAPAMIL (CALAN-SR) 240 MG CR TABLET    TAKE 1 TABLET (240 MG TOTAL)  BY MOUTH AT BEDTIME.  Modified Medications   No medications on file  Discontinued Medications   No medications on file

## 2015-06-01 ENCOUNTER — Telehealth: Payer: Self-pay | Admitting: Pulmonary Disease

## 2015-06-01 MED ORDER — VERAPAMIL HCL ER 240 MG PO TBCR: EXTENDED_RELEASE_TABLET | ORAL | Status: DC

## 2015-06-01 NOTE — Telephone Encounter (Signed)
Spoke with Suezanne Jacquet at [facility] He advised me that patient's spouse is very upset because he called [place] for a refill on medication this morning and we did not send it in. We attempted to contact patient's spouse back and left message for him to call us back, but he never called back, he just went to the pharmacy assuming that the medication was sent.   Rx sent to pharmacy. Patient's spouse notified that he needs to return call to confirm medication with our office in future before going to pharmacy.  Nothing further needed. Closing encounter

## 2015-06-01 NOTE — Telephone Encounter (Signed)
Cynthia Mathis at [facility] [phone], patient's husband is there upset waiting for medication.

## 2015-06-01 NOTE — Telephone Encounter (Signed)
lmtcb x1 for pt's husband. 

## 2015-07-06 ENCOUNTER — Other Ambulatory Visit: Payer: Self-pay | Admitting: Pulmonary Disease

## 2015-08-03 DIAGNOSIS — H353211 Exudative age-related macular degeneration, right eye, with active choroidal neovascularization: Secondary | ICD-10-CM | POA: Diagnosis not present

## 2015-08-11 ENCOUNTER — Ambulatory Visit (INDEPENDENT_AMBULATORY_CARE_PROVIDER_SITE_OTHER): Payer: Medicare Other | Admitting: Pulmonary Disease

## 2015-08-11 ENCOUNTER — Encounter: Payer: Self-pay | Admitting: Pulmonary Disease

## 2015-08-11 VITALS — BP 142/68 | HR 57 | Temp 97.1°F | Ht 62.0 in | Wt 139.2 lb

## 2015-08-11 DIAGNOSIS — K5730 Diverticulosis of large intestine without perforation or abscess without bleeding: Secondary | ICD-10-CM

## 2015-08-11 DIAGNOSIS — R413 Other amnesia: Secondary | ICD-10-CM

## 2015-08-11 DIAGNOSIS — E039 Hypothyroidism, unspecified: Secondary | ICD-10-CM

## 2015-08-11 DIAGNOSIS — E785 Hyperlipidemia, unspecified: Secondary | ICD-10-CM | POA: Diagnosis not present

## 2015-08-11 DIAGNOSIS — I10 Essential (primary) hypertension: Secondary | ICD-10-CM | POA: Diagnosis not present

## 2015-08-11 DIAGNOSIS — M150 Primary generalized (osteo)arthritis: Secondary | ICD-10-CM

## 2015-08-11 DIAGNOSIS — M8580 Other specified disorders of bone density and structure, unspecified site: Secondary | ICD-10-CM

## 2015-08-11 DIAGNOSIS — D126 Benign neoplasm of colon, unspecified: Secondary | ICD-10-CM

## 2015-08-11 DIAGNOSIS — M159 Polyosteoarthritis, unspecified: Secondary | ICD-10-CM

## 2015-08-11 MED ORDER — CHLORZOXAZONE 500 MG PO TABS: 500.0000 mg | ORAL_TABLET | Freq: Three times a day (TID) | ORAL | Status: DC | PRN

## 2015-08-11 MED ORDER — AZELASTINE HCL 0.1 % NA SOLN: 1.0000 | Freq: Two times a day (BID) | NASAL | Status: DC | PRN

## 2015-08-11 NOTE — Patient Instructions (Signed)
Today we updated your med list in our EPIC system...    Continue your current medications the same...  Today we prescribed PARAFON FORTE 500mg  one tab up to 3 times daily for muscle spasm...    Try this & HEAT for your back & knee pain...    We will arrange for an ORTHOPEDIC appt for further eval...  We also arote for ASTELIN  Nasal spray to use as needed for the runny nose...  Please return to our lab one morning this week for your FASTING blood work...    We will contact you w/ the results when available...   Call for any questions...  Let's plan a follow up visit in 51mo, sooner if needed for problems.Marland KitchenMarland Kitchen

## 2015-08-12 ENCOUNTER — Encounter: Payer: Self-pay | Admitting: Pulmonary Disease

## 2015-08-12 ENCOUNTER — Other Ambulatory Visit (INDEPENDENT_AMBULATORY_CARE_PROVIDER_SITE_OTHER): Payer: Medicare Other

## 2015-08-12 DIAGNOSIS — M150 Primary generalized (osteo)arthritis: Secondary | ICD-10-CM

## 2015-08-12 DIAGNOSIS — M810 Age-related osteoporosis without current pathological fracture: Secondary | ICD-10-CM

## 2015-08-12 DIAGNOSIS — M8580 Other specified disorders of bone density and structure, unspecified site: Secondary | ICD-10-CM

## 2015-08-12 DIAGNOSIS — I10 Essential (primary) hypertension: Secondary | ICD-10-CM

## 2015-08-12 DIAGNOSIS — K5730 Diverticulosis of large intestine without perforation or abscess without bleeding: Secondary | ICD-10-CM | POA: Diagnosis not present

## 2015-08-12 DIAGNOSIS — E785 Hyperlipidemia, unspecified: Secondary | ICD-10-CM | POA: Diagnosis not present

## 2015-08-12 DIAGNOSIS — E039 Hypothyroidism, unspecified: Secondary | ICD-10-CM | POA: Diagnosis not present

## 2015-08-12 DIAGNOSIS — M159 Polyosteoarthritis, unspecified: Secondary | ICD-10-CM

## 2015-08-12 LAB — LIPID PANEL
Cholesterol: 155 mg/dL (ref 0–200)
HDL: 60 mg/dL (ref >39.00)
LDL Cholesterol: 80 mg/dL (ref 0–99)
NonHDL: 94.62
TRIGLYCERIDES: 71 mg/dL (ref 0.0–149.0)
Total CHOL/HDL Ratio: 3
VLDL: 14.2 mg/dL (ref 0.0–40.0)

## 2015-08-12 LAB — CBC WITH DIFFERENTIAL/PLATELET
BASOS ABS: 0.1 10*3/uL (ref 0.0–0.1)
Basophils Relative: 0.6 % (ref 0.0–3.0)
EOS ABS: 0.2 10*3/uL (ref 0.0–0.7)
Eosinophils Relative: 2.4 % (ref 0.0–5.0)
HCT: 43.9 % (ref 36.0–46.0)
Hemoglobin: 14.6 g/dL (ref 12.0–15.0)
LYMPHS ABS: 3.2 10*3/uL (ref 0.7–4.0)
Lymphocytes Relative: 30.6 % (ref 12.0–46.0)
MCHC: 33.1 g/dL (ref 30.0–36.0)
MCV: 92 fl (ref 78.0–100.0)
MONOS PCT: 8.4 % (ref 3.0–12.0)
Monocytes Absolute: 0.9 10*3/uL (ref 0.1–1.0)
NEUTROS ABS: 6 10*3/uL (ref 1.4–7.7)
NEUTROS PCT: 58 % (ref 43.0–77.0)
PLATELETS: 234 10*3/uL (ref 150.0–400.0)
RBC: 4.77 Mil/uL (ref 3.87–5.11)
RDW: 13.4 % (ref 11.5–15.5)
WBC: 10.4 10*3/uL (ref 4.0–10.5)

## 2015-08-12 LAB — BASIC METABOLIC PANEL
BUN: 18 mg/dL (ref 6–23)
CALCIUM: 9.3 mg/dL (ref 8.4–10.5)
CO2: 29 meq/L (ref 19–32)
CREATININE: 0.84 mg/dL (ref 0.40–1.20)
Chloride: 103 mEq/L (ref 96–112)
GFR: 69.19 mL/min (ref >60.00)
GLUCOSE: 94 mg/dL (ref 70–99)
Potassium: 4.2 mEq/L (ref 3.5–5.1)
Sodium: 141 mEq/L (ref 135–145)

## 2015-08-12 LAB — TSH: TSH: 0.68 u[IU]/mL (ref 0.35–4.50)

## 2015-08-12 LAB — VITAMIN D 25 HYDROXY (VIT D DEFICIENCY, FRACTURES): VITD: 43.94 ng/mL (ref 30.00–100.00)

## 2015-08-12 NOTE — Progress Notes (Signed)
Subjective:    Patient ID: Cynthia Mathis, female    DOB: 1933-01-02, 82 y.o.   MRN: [id number]  HPI 82 y/o WF here for a follow up visit... she has mult med problems as noted below ...  ~  SEE PREV EPIC NOTES FOR OLDER DATA >>   ~  sees DrPerry for GI- divertics, polyps, hems & a bout of diverticulitis in Mar08...  ~  sees Dr Risa Grill Urology w/ microscopic hematuria and a neg eval 5/08... 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 ~  sees DrLLomax for Derm w/ alopecia- offered Rogaine but decided against it... ~  Sees DrPenumali for Neuro w/ memory loss, early stage dementia...   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  September 17, 2012:  concerned about her memory; husb has been under the care of DrLove for >29yrs w/ mild cognitive impairment  LABS 3/14:  Chems- wnl w/ BS=106, A1c=6.2;  TSH=0.37;  Sed=14...    January 23, 2013:  visit w/ DrPenumalli 3/14> her MMSE was 29/30 & he planned to check MRI Brain & B12 level, he did not rec starting meds yet (husb takes Razadyne & feels it helps him).  ADDENDUM 04/15/13 >> pt called & wants off Simva20 after reading something about memory loss=> change to Atorva20 (she never did).  CXR 1/15 showed heart at upper lim of norm, lungs clear (mild dev trachea to right c/w enlarged left lobe of thyroid- no change).  EKG 1/15 showed SBrady, rate44, NSSTTW changes.  LABS 1/15:  FLP- at goals on Simva20;  Chems- wnl;  CBC- wnl;  TSH=0.59;  VitD=54.  BMD 08/22/13>  Lowest Tscore= -0.9 in APspine... She is off bisphos therapy for several yrs now & remains improved...   NOTE:  She wants to try ARICEPT10 start 1/2 => 1 tab daily as trial; Pulse is low at 48 & we decided to stop ToprolXL & replace it w/ VALSARTAN160/d.  ~  February 04, 2014:  92mo ROV & Cynthia Mathis has several minor somatic complaints but overall stable & feels well;  our scale shows 13# wt loss to 132# today- she states good appetite, eating satis, "I get lots of exercise playng pickle ball" she says;  She notes that feet/ toes are cold (no pain, no sores), unusual feeling in legs at night, sweats alot, runny nose, etc;  She reallly dosen't want more meds but will try Astepro for the rhinorrhea...      Last OV we stopped her Metoprolol due to bradycardia & substituted Diovan160 for her BP> now on this +CalanSR240 & HCT25; BP= 120/60 & denies HA, CP, palpit, SOB, edema, etc...     She remains on Simva20 w/ good control of Lipids...    She remains on Aricept10 & feels that her memory is stable, denies deterioration...  We reviewed prob list, meds, xrays and labs> see below for updates >> MEDS refilled per request...   LABS, Mammogram, CXR, BMD, EKG> all reviewed ...   ~  August 10, 2014:  31mo ROV & it took quite a bit of time getting her med list straight today- she did not know her meds, her card w/ meds written on it was old & out of date, Epic meds were not up to date either & we called Pharm- pt is reminded to bring all med bottles to every office visit... She tells me that [facility] wants  her to get a hearing test & we will refer to ENT office audiologist... She has mult minor somatic complaints- runny nose, some numbness intermit in legs/ twitching, pain in neck muscles, stools are very soft, some urgency; we discussed antihist prn, apply heat to muscles, incr fiber, consider Neuro eval for neuropathy=> no new meds at this point...    Macular Degen> followed by DrHecker & DrRankin; she is getting shot Qmo or so for the MD & sched for bilat cat surg soon...    HBP> on ASA81, Valsartan160, Verap240, HCT25; BP= 128/76 & she denies HA, CP, palpit, dizzy, SOB, edema, etc...    Hyperlipid> on Simva20; FLP 2/16 show TChol 161, TG 107, HDL 61, LDL 79; continue same meds...    Hx Impaired FBS> Hx BS in the 130 range prev, but all readings ~100 x yrs now;  Labs 2/16 showed  BS=99    Hypothy> hx multinod thyroid x yrs; had thyroidectomy 1990 by DrWeatherly; she weaned off Thyroid hormone yrs ago & has remained clinically & biochem euthyroid; Labs 2/16 showed TSH= 0.76    GI- Divertics, Polyps> hx diverticulitis in 2008- followed by DrPerry; last colonoscopy 4/13 showed severe diverticulosis w/o inflamm or polyps seen...    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 Labs 1/15 showed Hg= 14.7 & LFTs are WNL.    DJD, Osteoporosis> on Ca, MVI, Glucosamine; she uses OTC analgesic meds as needed; see BMDs below- on Bisphos drug holiday since 7/11 & due for f/u BMD...    Memory Loss> eval by [facility] 4/14, DrPenumalli; felt to have mild cognitive impairment vs normal aging, then w/ progressive impairment & they have her on Donepezil10 & Namenda10Bid now... We reviewed prob list, meds, xrays and labs> see below for updates >>   LABS 2/16:  FLP- at goals on Simva20;  Chems- wnl;  CBC- wnl;  TSH=0.76...  ~  February 08, 2015:  60mo ROV & Cynthia Mathis has a list of complaints today> runny nose (offered Zyrtek & Astelin but she doesn't want more meds), cold feet, hot & sweaty, thirsty, right knee pain, stepped on a hornets nest w/ 12 stings and reaction; she also reported an episode of syncope while visiting daugh in [place]- she was taken to daughter's doctor & reports that everything checked out OK- we do not have the data... She remains very active & plays "pickle ball" everyday.    Macular Degen> followed by DrHecker & DrRankin; she is getting shot Qmo or so for the MD & sched for bilat cat surg soon...    Hearing Loss> she saw DrGore, ENT w/ audiology showing bilat SNHL...    HBP> on ASA81, Valsartan160, Verap240, HCT25; BP= 132/74 & she denies HA, CP, palpit, dizzy, SOB, edema, etc...    Hyperlipid> on Simva20; FLP 2/16 show TChol 161, TG 107, HDL 61, LDL 79; continue same meds...    Hx Impaired  FBS> Hx BS in the 130 range prev, but all readings ~100 x yrs now;  Labs 2/16 showed BS=99    Hypothy> hx multinod thyroid x yrs; had thyroidectomy 1990 by DrWeatherly; she weaned off Thyroid hormone yrs ago & has remained clinically & biochem euthyroid; Labs 2/16 showed TSH= 0.76    GI- Divertics, Polyps> hx diverticulitis in 2008- followed by DrPerry; last colonoscopy 4/13 showed severe diverticulosis w/o inflamm or polyps seen...    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 Labs 1/15 showed Hg= 14.7 & LFTs are WNL.    DJD, Osteoporosis> on Ca, MVI, Glucosamine; she uses OTC analgesic meds as needed; see BMDs below- on Bisphos drug holiday since 7/11 & due for f/u BMD...    Memory Loss> eval by [facility] 4/14, DrPenumalli; felt to have mild cognitive impairment vs normal aging, then w/ progressive impairment & they have her on Donepezil10 & Namenda10Bid now... We reviewed prob list, meds, xrays and labs>>   CT Abd 10/01/14>  Severe diverticulosis & mild acute sigm diverticulitis + inflammed distal rectum/anus c/w acute proctitis; 70mm solitary cystic lesion in body of pancreas, gallstones and atherosclerotic changes in Ao... IMP/PLAN >>  Cynthia Mathis appears stable, but has mult somatic complaints, meanwhile she does not want any new meds; she will request records from [place] in [place] (nevetr received) & bring these to [place] when avail; continue same meds for now & ROV 12mo...  ~  May 06, 2015:  51mo ROV & add-on appt requested by pt due to right knee pain and LBP for the last 1wk she says; no known trauma or injury, and she denies prev problems x occas soreness; she thinks that she may have had a shot in her knee once several yrs ago; as for the back pain- it is dull, in center of back w/o radiation...     EXAM shows Afeb, VSS, O2sat=97%;  HEENT- neg, mallampati1;  Chest- clear w/o w/r/r;  Heart- RR w/o m/r/g;   Abd- soft, nontender, neg;  Ext- +crepitus in right knee, w/o c/c/e;  Neuro- intact...   XRay right knee 05/06/15> osteoarthtitis w/ medial joint space compartment narrowing with milder patellofemoral joint space compartment narrowing. Marginal osteophytes project from all 3 compartments, mild spurring along the tibial spine, no joint effusion; Bones are demineralized...  XRay LS spine 05/06/15>  Disc space narrowing at L2-3 with endplate sclerosis. Mild osteophytic changes are seen. Diffuse aortic calcifications are noted. NAD... IMP/PLAN>>  Cynthia Mathis is c/o 1 wk hx right knee & LBP; exam shows good ROM right knee w/ crepitation; XRays indicate degen arthritis; we discussed Rx w/ Tramadol+Tylenol, neoprene sleeve for knee and rest/ heat for back; I indicated next step is to refer to Ortho for shot if symptoms don't respond to Rx...   ~  August 11, 2015:  32mo ROV & Cynthia Mathis is stable- c/o runny nose, LBP, shoulder pain, right knee pain; she has tried Tramadol w/o much benefit & we discussed adding topical heat, ParafonTid 7 refer to Ortho for further eval... We reviewed the following medical problems during today's office visit >>     Macular Degen> followed by DrHecker & DrRankin; she received shots MD & had bilat cat surg but we do not have notes from them...    Hearing Loss> she saw DrGore, ENT w/ audiology showing bilat SNHL...    HBP> on ASA81, Valsartan160, Verap240, HCT25; BP= 142/68 & she denies HA, CP, palpit, dizzy, SOB, edema, etc...    Hyperlipid> on Simva20; FLP 2/17 show TChol 155, TG 71, HDL 60, LDL 80; continue same meds...    Hx Impaired FBS> Hx BS in the 130 range prev, but all readings ~100 x yrs now;  Labs 2/17 showed BS=94    Hypothy> hx multinod thyroid x yrs; had thyroidectomy 1990 by DrWeatherly; she weaned off Thyroid hormone yrs ago & has remained clinically & biochem euthyroid; Labs 2/17 showed TSH= 0.68    GI- Divertics, Polyps>  hx diverticulitis in 2008- followed by  DrPerry; last colonoscopy 5/16 showed severe diverticulosis w/o inflamm or polyps seen...    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 Labs 2/17 showed Hg= 14.6 & prev LFTs are WNL.    DJD, Osteoporosis> on Ca, MVI, Glucosamine; she uses OTC analgesic meds as needed; see BMDs below- on Bisphos drug holiday since 7/11 & due for f/u BMD... Labs 2/17 w/ VitD= 44    Memory Loss> eval by [facility] 4/14, DrPenumalli; felt to have mild cognitive impairment vs normal aging, then w/ progressive impairment & they have her on Donepezil10 & Namenda10Bid now... EXAM shows Afeb, VSS, O2sat=98%;  HEENT- neg, mallampati1;  Chest- clear w/o w/r/r;  Heart- RR w/o m/r/g;  Abd- soft, nontender, neg;  Ext- +crepitus in right knee, w/o c/c/e;  Neuro- intact...   LABS 08/11/15>  FLP- all parameters at goals on Simva20;  Chems- wnl;  CBC- wnl;  TSH=0.68;  VitD=44...  IMP/PLAN>>  Cynthia Mathis is stable- we discussed trying heat, Tramadol, Parafon for her arthritic & LBP complaints; we will refer to Ortho; rec routine ROV in 59mo...            Problem List:  MACULAR DEGENERATION (ICD-362.50) - eval by DrHecker w/ foveal telangiectasia, macular drusen, macular degeneration, and cataracts- being followed regularly... vision is preserved so far... ~  11/11:  f/u DrHecker- stable age related mac degen, no retinopathy. ~  11/12:  f/u DrHecker- has advanced bilat AMD, sent to [facility], we don't have his notes... ~  She has been getting shots in the eyes for macular degen, we do not have notes from [facility]... ~  2/16: she tells me that she continues to receive the shots; she is also sched for bilat cat surg by DrHecker...  HYPERTENSION (ICD-401.9) >>  ~  on VERAPAMIL 240mg /d,  TOPROL-XL 25mg /d,  HCTZ 25mg /d... Prev on Lisinopril but this was stopped due to throat clearing tic & mild cough, plus K=6.0 at the time (all improved off the  ACE)... ~  8/10: borderline BP here and she will monitor at home carefully & call if not <150/90. ~  7/11: she reports that she incr the Lisinopril to 1&1/2 tabs due to BP~150+, now better... ~  7/12:  BP stable on Verap240 & Lisinopril 30mg /d> BP= 140/68 today> denies HA, fatigue, visual changes, CP, palipit, dizziness, syncope, dyspnea, edema, etc... ~  1/13:  BP= 160/90 (didn't take med today); states BP are ok at home; continue same meds + low sodium etc... ~  CXR 1/13 showed norm heart size, clear lungs, mild thor spondylosis, NAD.[name]. ~  7/13:  BP= 152/82 & she notes some troublesome throat clearing & mild cough; we decided to STOP the ACE & switch to LOSARTAN 100mg /d, monitor her symptoms and her blood presure... ~  CXR 9/13 showed calcif Ao, clear lungs, suspect left thyroid enlargement, DJD in Tspine... ~  EKG 10/13 showed SBrady, rate58, wnl, NAD... ~  11/13: she had long convoluted hx of BP med changes- see above- now on Verap240, MetopER25, HCT25, & BP= 126/72; we discussed options but she is content to continue these same meds for now & watch BP... ~  3/14: on ASA81, Metop25, Verap240, HCT25; BP= 140/80 & she denies HA, CP, palpit, dizzy, SOB, edema, etc. ~  1/15: on ASA81, Metop25, Verap240, HCT25; BP= 140/82 & she remains essen asymptomatic; she is bradycardic & we decided to stop ToprolXL &  replace it w/ [id number] ~  CXR 1/15 showed heart at upper lim of norm, mild dev of trach to right, clear lungs, NAD...  ~  EKG 1/15 showed SBrady, rate44, NSSTTWA, NAD.[name]. ~  7/15: on ASA81, Diovan160, Verap240, HCT25; BP= 120/60 & she is essentially asymptomatic, pulse=60, continue same meds... ~  2/16: on same meds & BP= 128/76, she remains asymptomatic...  DYSLIPIDEMIA (ICD-272.4) - on ZOCOR 20mg /d... plus doing well on diet... ~  [place] 6/09 showed TChol 144, TG 140, HDL 39, LDL 77... continue same. ~  FLP 2/10 showed TChol 164, TG 77, HDL 54, LDL 95 ~  FLP 8/10 showed TChol 138, TG 51, HDL 48,  LDL 80 ~  FLP 1/11 showed TChol 155, TG 100, HDL 48, LDL 87 ~  FLP 7/11 showed TChol 158, TG 74, HDL 55, LDL 89 ~  FLP 1/12 showed TChol 155, TG 101, HDL 50, LDL 85 ~  FLP 1/13 on Simva20 showed TChol 169, TG 118, HDL 56, LDL 89  ~  FLP 1/15 on Simva20 showed TChol 155, TG 122, HDL 50, LDL 81 ~  FLP 2/16 on Simva20 showed TChol 161, TG 107, HDL 61, LDL 79; continue same meds.  Hx Impaired Fasting Glucose >> on diet alone... FBS @ home betw 120-130 recently... she is on a good diet and weight is down at 139# today (63"  BMI= 24)... ~  labs 3/09 showed FBS= 119... subseq HgA1c 5/09 was 6.4... diet Rx. ~  labs 8/09 showed BS= 106, HgA1c= 5.9.Marland KitchenMarland Kitchen rec- same. ~  labs 2/10 showed BS= 102, A1c= 5.9 ~  labs 1/11 showed BS= 98 ~  labs 1/12 showed BS= 95 ~  Labs 1/13 showed BS= 101 ~  Labs 3/14 showed BS=106, A1c=6.2 ~  Labs 1/15 showed BS= 103 ~  Labs 2/16 showed FBS= 99  HYPOTHYROIDISM (ICD-244.9) - off Synthroid now...  clinically & biochemically euthyroid... remote hx of overactive thyroid many yrs ago in [place] (we don't have records)... she had an eval by DrKohut in 1987 w/ familial multinodular goiter w/ neg FNA... started on Rx but goiter enlarged- had thyroidectomy 1990 by DrWeatherly... ~  labs 3/09 showed TSH = 0.35 (0.35-5.5)... we have slowly decr her dose to 75mcg/d... ~  labs 8/09 showed TSH= 0.33... rec- decr the Synth50 to 1/2 tab daily. ~  labs 2/10 showed =TSH= 0.54 and Synthroid stopped... ~  labs 4/10 off med showed TSH= 0.59 ~  labs 8/10 showed TSH= 0.60 ~  labs 1/11 showed TSH= 0.60 ~  labs 7/11 showed TSH= 0.56 ~  labs 1/12 showed TSH= 0.60 ~  Labs 1/13 showed TSH= 0.58 ~  Labs 3/14 showed TSH= 0.37 ~  Labs 1/15 showed TSH= 0.59 ~  Labs 2/16 showed TSH= 0.76  DIVERTICULOSIS OF COLON (ICD-562.10) COLONIC POLYPS (ICD-211.3) - followed by DrPerry... bout of diverticulitis in Mar08... ~  last colonoscopy 3/08 showed divertics, 52mm polyp, hems... path=hyperplastic, f/u planned  59yrs. ~  F/u colonoscopy 4/13 by DrPerry showed severe diverticulosis w/o inflamm and w/o polyps etc...  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  HEMATURIA, HX OF (ICD-V13.09) - eval 4/08 by DrGrapey w/ neg Sonar, Cysto, cytology...  GYN >> DrKRichardson, Mammograms at [facility] (NEG- 4/15 study)...  DEGENERATIVE JOINT DISEASE (ICD-715.90) - c/o discomfort in her shoulders and knees... uses  OTC meds as needed. ~  04/2015:  She presented w/ 1wk hx right knee & LBP; exam showed crepitation in right knee but good ROM & SLR was neg; Rec to use Tramadol+Tylenol, neoprene support sleeve for knee & rest/heat/ exercises for back; we will refer to Ortho for shots if not responding...   Hx of OSTEOPOROSIS (ICD-733.00) - on Fosamax 70/wk thru 7/11, Caltrate 1/d, MVI, Vit D 1000/d... ~  initial BMD 10/01 showed TScores -0.2  to -3.2 in the spine...  ~  f/u studies w/ steady improvement on Fosamax therapy... ~  BMD 3/09 w/ TScores -0.1 to -1.7 in the spine... continue Rx. ~  BMD 7/11 showed TScores -2.0 Spine, and -0.3 in right FemNeck... we opted for Bisphos drug holiday to start now. ~  Labs 1/13 showed Vit D level = 48, continue same... ~  BMD 2/15 showed WNL Tscores; rec to continue calcium, MVI, VitD, wt bearing exercise...  PROGRESSIVE MEMORY LOSS >>  ~  MMSE 5/09 by Doran Heater, NP was 29/30... Reassured. ~  3/14: she presented w/ c/o worsening memory & MMSE has diminished- refer to [facility] for eval & rx... ~  Neuro eval 4/14 by DrPenumalli> mild cognitive impairment vs norm aging, he did not rec starting meds... ~  CT Brain 4/14 showed mild atrophy & sm vessel dis ~  She continues to f/u w/ Neurology periodically... ~  1/15: ROV here & husb more concerned re her memory, recall was 0/3  after distraction; husb has apparently done well on Aricept & she wants trial, start ARICEPT5=>10 daily... ~  She has been followed by DrPenumalli & they added Namenda 10Bid to her Aricept10...  ANXIETY (ICD-300.00) - prev on Alpraz Prn but hasn't used in yrs...  DERMATOLOGY >> LIPOMA (ICD-214.9) - notes large lipoma in RLQ/ rt flank area... some discomfort noted... seen by DrLeone in the past w/o surg... she saw DrCornett, CCS, 1/11 for second opinion & she will decide re: excision... ~  5/13:  She had Basal cell ca removed from the left side of her nose, w/ subseq deeper margins & reports everything is OK... ~  7/13:  She is concerned that the lipoma may be growing & she requests f/u DrCornett for excision... ~  10/13:  Lipoma removed by DrCornett in outpt surg 04/17/12...  SHINGLES - remote hx of right T8-9 shingles in 1986...   Past Surgical History  Procedure Laterality Date  . Thyroidectomy  1990    [name]  . Appendectomy  1947  . Colonoscopy    . Mass excision  04/17/2012    Procedure: EXCISION MASS;  Surgeon: [name], MD;  Location: [facility];  Service: General;  Laterality: N/A;    Outpatient Encounter Prescriptions as of 08/11/2015  Medication Sig  . aspirin 81 MG tablet Take 81 mg by mouth daily.  . Calcium Carbonate-Vitamin D (CALCIUM 600+D) 600-400 MG-UNIT per tablet Take 1 tablet by mouth daily.  . Cyanocobalamin (VITAMIN B 12 PO) Take 1 tablet by mouth daily.  [name] donepezil (ARICEPT) 10 MG tablet Take 1 tablet (10 mg total) by mouth at bedtime.  . fish oil-omega-3 fatty acids 1000 MG capsule Take 1 g by mouth daily.  [name] glucosamine-chondroitin 500-400 MG tablet Take 1 tablet by mouth 3 (three) times daily.  . hydrochlorothiazide (HYDRODIURIL) 25 MG tablet Take 1 tablet (25 mg total) by mouth daily.  . memantine (NAMENDA) 10 MG tablet Take 1 tablet (10 mg total) by mouth 2 (two)  times daily. (Patient taking differently: Take 10 mg by mouth  daily. )  . Multiple Vitamins-Minerals (MULTIVITAMIN & MINERAL PO) Take 1 tablet by mouth daily.  . simvastatin (ZOCOR) 20 MG tablet TAKE 1 TABLET (20 MG TOTAL) BY MOUTH AT BEDTIME.  . traMADol (ULTRAM) 50 MG tablet Take 1 tablet (50 mg total) by mouth 3 (three) times daily as needed.  . valsartan (DIOVAN) 160 MG tablet TAKE 1 TABLET (160 MG TOTAL) BY MOUTH DAILY.  . verapamil (CALAN-SR) 240 MG CR tablet TAKE 1 TABLET (240 MG TOTAL)  BY MOUTH AT BEDTIME.  [name] azelastine (ASTELIN) 0.1 % nasal spray Place 1-2 sprays into both nostrils 2 (two) times daily as needed for rhinitis. Use in each nostril as directed  . chlorzoxazone (PARAFON) 500 MG tablet Take 1 tablet (500 mg total) by mouth 3 (three) times daily as needed for muscle spasms.   No facility-administered encounter medications on file as of 08/11/2015.    Allergies  Allergen Reactions  . Epinephrine Palpitations    REACTION: heart racing    Current Medications, Allergies, Past Medical History, Past Surgical History, Family History, and Social History were reviewed in [employer] record.    Review of Systems        See HPI - all other systems neg except as noted...  The patient complains of dyspnea on exertion.  The patient denies anorexia, fever, weight loss, weight gain, vision loss, decreased hearing, hoarseness, chest pain, syncope, peripheral edema, prolonged cough, headaches, hemoptysis, abdominal pain, melena, hematochezia, severe indigestion/heartburn, hematuria, incontinence, muscle weakness, suspicious skin lesions, transient blindness, difficulty walking, depression, unusual weight change, abnormal bleeding, enlarged lymph nodes, and angioedema.   Objective:   Physical Exam    WD, WN, 82 y/o WF in NAD... VITAL SIGNS:  Reviewed... GENERAL:  Alert & oriented; pleasant & cooperative... HEENT:  /AT, EOM-wnl, PERRLA, EACs-clear, TMs-wnl, NOSE-clear, THROAT-clear & wnl. NECK:  Supple w/ fair ROM; no  JVD; normal carotid impulses w/o bruits; scar of prev thyroid surg- no nodules or goiter; no lymphadenopathy. CHEST:  Clear to P & A; without wheezes/ rales/ or rhonchi. HEART:  Regular Rhythm; without murmurs/ rubs/ or gallops. ABDOMEN:  Soft & nontender; normal bowel sounds; no organomegaly or masses detected. EXT: without deformities, mild arthritic changes; no varicose veins/ venous insuffic/ or edema. prob heel spurs w/ some plantar fascia pain> advised soaks & stretching... NEURO:  CN's intact;  no focal neuro deficits... DERM:  Scar in right flank after lipoma excision... no other lesions noted...  MMSE 09/17/12>  She did not know the Pres ("I can see him, a black guy"), VP, or Gov;  Didn't know prev pres etc;  Recall 3/3 immed, but only 1/3 after distraction;  Serial 7s & WORLD backwards were fluent; proverbs were abstracted normally...  RADIOLOGY DATA:  Reviewed in the EPIC EMR & discussed w/ the patient...  LABORATORY DATA:  Reviewed in the EPIC EMR & discussed w/ the patient...   Assessment & Plan:    Macular Degen>  Followed by DrHecker/ [facility] & getting MD shots...  HBP>  on Verap, Diovan, HCT- along w/ no salt; & BP appears to be well controlled & pulse improved (60/min)  DYSLIPIDEMIA>  Stable on diet & Simva20...  Borderline DM>  Hx BS in the 120-130 range yrs ago but stable ~100 for the last 3+yrs on diet alone...  Hx Hypothyroidism>  She weaned off Synthroid several yrs ago & TSH has remained normal, clinically euthyroid as well.Marland KitchenMarland Kitchen  GI>  Divertics, Colon Polyps>  Stable & up to date w/ f/u colon 4/13 from DrPerry==> divertics, no polyps...  LIPOMA>  Removed 10/13 from RLQ area by DrCornett...  DJD>  She notes some right knee discomfort playing "pickle ball" & is advised to wear knee brace, take Aleve prn (she declines further eval or prescription meds)... 10/16>  1 wk hx right knee & LBP; exam shows good ROM right knee w/ crepitation; XRays indicate degen arthritis;  we discussed Rx w/ Tramadol+Tylenol, neoprene sleeve for knee and rest/ heat for back; I indicated next step is to refer to Ortho for shot if symptoms don't respond to Rx.  Osteoporosis>  Hx BMD in 2001 w/ TScore =3.2 in Spine; Rx'd Fosamax for 75yrs w/ steady improvement & on Bisphos drug holiday since 7/11 & BMD is 2015 was WNL- continue supplements w/ Calcium, MVI, Vit D...  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  MEMORY LOSS>  Followed by DrPenumalli on Aricept & Namenda...  Other medical problems as listed...   Patient's Medications  New Prescriptions   AZELASTINE (ASTELIN) 0.1 % NASAL SPRAY    Place 1-2 sprays into both nostrils 2 (two) times daily as needed for rhinitis. Use in each nostril as directed   CHLORZOXAZONE (PARAFON) 500 MG TABLET    Take 1 tablet (500 mg total) by mouth 3 (three) times daily as needed for muscle spasms.  Previous Medications   ASPIRIN 81 MG TABLET    Take 81 mg by mouth daily.   CALCIUM CARBONATE-VITAMIN D (CALCIUM 600+D) 600-400 MG-UNIT PER TABLET    Take 1 tablet by mouth daily.   CYANOCOBALAMIN (VITAMIN B 12 PO)    Take 1 tablet by mouth daily.   DONEPEZIL (ARICEPT) 10 MG TABLET    Take 1 tablet (10 mg total) by mouth at bedtime.   FISH OIL-OMEGA-3 FATTY ACIDS 1000 MG CAPSULE    Take 1 g by mouth daily.   GLUCOSAMINE-CHONDROITIN 500-400 MG TABLET    Take 1 tablet by mouth 3 (three) times daily.   HYDROCHLOROTHIAZIDE (HYDRODIURIL) 25 MG TABLET    Take 1 tablet (25 mg total) by mouth daily.   MEMANTINE (NAMENDA) 10 MG TABLET    Take 1 tablet (10 mg total) by mouth 2 (two) times daily.   MULTIPLE VITAMINS-MINERALS (MULTIVITAMIN & MINERAL PO)    Take 1 tablet by mouth daily.   SIMVASTATIN (ZOCOR) 20 MG TABLET    TAKE 1 TABLET (20 MG TOTAL) BY MOUTH AT BEDTIME.   TRAMADOL (ULTRAM) 50 MG TABLET    Take 1 tablet (50 mg total) by mouth 3 (three) times daily as  needed.   VALSARTAN (DIOVAN) 160 MG TABLET    TAKE 1 TABLET (160 MG TOTAL) BY MOUTH DAILY.   VERAPAMIL (CALAN-SR) 240 MG CR TABLET    TAKE 1 TABLET (240 MG TOTAL)  BY MOUTH AT BEDTIME.  Modified Medications   No medications on file  Discontinued Medications   No medications on file

## 2015-08-24 DIAGNOSIS — M2241 Chondromalacia patellae, right knee: Secondary | ICD-10-CM | POA: Diagnosis not present

## 2015-08-24 DIAGNOSIS — M545 Low back pain: Secondary | ICD-10-CM | POA: Diagnosis not present

## 2015-08-24 DIAGNOSIS — M1711 Unilateral primary osteoarthritis, right knee: Secondary | ICD-10-CM | POA: Diagnosis not present

## 2015-08-25 NOTE — Progress Notes (Signed)
Quick Note:  Called and spoke to pt's husband. Informed him of the results and recs per SN. Pt's husband verbalized understanding and denied any further questions or concerns at this time.   ______

## 2015-09-02 DIAGNOSIS — G8929 Other chronic pain: Secondary | ICD-10-CM | POA: Diagnosis not present

## 2015-09-02 DIAGNOSIS — M545 Low back pain: Secondary | ICD-10-CM | POA: Diagnosis not present

## 2015-09-06 DIAGNOSIS — M545 Low back pain: Secondary | ICD-10-CM | POA: Diagnosis not present

## 2015-09-06 DIAGNOSIS — G8929 Other chronic pain: Secondary | ICD-10-CM | POA: Diagnosis not present

## 2015-09-07 ENCOUNTER — Other Ambulatory Visit: Payer: Self-pay | Admitting: Pulmonary Disease

## 2015-09-08 DIAGNOSIS — G8929 Other chronic pain: Secondary | ICD-10-CM | POA: Diagnosis not present

## 2015-09-08 DIAGNOSIS — M545 Low back pain: Secondary | ICD-10-CM | POA: Diagnosis not present

## 2015-09-13 DIAGNOSIS — G8929 Other chronic pain: Secondary | ICD-10-CM | POA: Diagnosis not present

## 2015-09-13 DIAGNOSIS — M545 Low back pain: Secondary | ICD-10-CM | POA: Diagnosis not present

## 2015-09-15 DIAGNOSIS — M545 Low back pain: Secondary | ICD-10-CM | POA: Diagnosis not present

## 2015-09-15 DIAGNOSIS — G8929 Other chronic pain: Secondary | ICD-10-CM | POA: Diagnosis not present

## 2015-09-21 ENCOUNTER — Encounter: Payer: Self-pay | Admitting: Adult Health

## 2015-09-21 ENCOUNTER — Ambulatory Visit (INDEPENDENT_AMBULATORY_CARE_PROVIDER_SITE_OTHER)
Admission: RE | Admit: 2015-09-21 | Discharge: 2015-09-21 | Disposition: A | Discharge status: Home / Self Care | Payer: Medicare Other | Source: Ambulatory Visit | Attending: Adult Health | Admitting: Adult Health

## 2015-09-21 ENCOUNTER — Other Ambulatory Visit: Payer: Medicare Other

## 2015-09-21 ENCOUNTER — Ambulatory Visit (INDEPENDENT_AMBULATORY_CARE_PROVIDER_SITE_OTHER): Payer: Medicare Other | Admitting: Adult Health

## 2015-09-21 VITALS — BP 138/78 | HR 55 | Temp 97.4°F | Ht 62.0 in | Wt 135.0 lb

## 2015-09-21 DIAGNOSIS — J111 Influenza due to unidentified influenza virus with other respiratory manifestations: Secondary | ICD-10-CM

## 2015-09-21 DIAGNOSIS — R509 Fever, unspecified: Secondary | ICD-10-CM | POA: Diagnosis not present

## 2015-09-21 DIAGNOSIS — R05 Cough: Secondary | ICD-10-CM

## 2015-09-21 DIAGNOSIS — R059 Cough, unspecified: Secondary | ICD-10-CM

## 2015-09-21 DIAGNOSIS — R0602 Shortness of breath: Secondary | ICD-10-CM | POA: Diagnosis not present

## 2015-09-21 LAB — POCT INFLUENZA A/B
INFLUENZA A, POC: POSITIVE — AB
INFLUENZA B, POC: NEGATIVE

## 2015-09-21 MED ORDER — OSELTAMIVIR PHOSPHATE 75 MG PO CAPS: 75.0000 mg | ORAL_CAPSULE | Freq: Two times a day (BID) | ORAL | Status: DC

## 2015-09-21 NOTE — Assessment & Plan Note (Signed)
Influenza positive with URI  Check cXR   Plan  Tamiflu Twice daily  For 5 days.  Mucinex DM Twice daily  As needed  Cough/congestion.  Fluids and rest .  Chest xray today  Tylenol As needed   Follow up with [name]  As planned and As needed   Please contact office for sooner follow up if symptoms do not improve or worsen or seek emergency care

## 2015-09-21 NOTE — Progress Notes (Signed)
Quick Note:  LVM for pt to return call ______ 

## 2015-09-21 NOTE — Progress Notes (Signed)
Subjective:    Patient ID: Cynthia Mathis, female    DOB: 1932/09/22, 82 y.o.   MRN: [id number]  HPI 82 yo female with HTN and memory loss followed by [name]    09/21/2015 Acute OV  Pt presents for an acute office . Complains of increased SOB, dry cough, PND, chest congestion/tightness, fever , and body aches  x 3 days. Denies any sinus congestion, fever, nausea or vomiting.  Flu swab is positive  in office.  No otc used .  No known sick contacts at home.  Appetite is good w/ no nv/d.   Past Medical History  Diagnosis Date  . Macular degeneration   . Hypertension   . Dyslipidemia   . Borderline diabetes mellitus   . Hypothyroid   . Diverticulosis of colon   . Hx of colonic polyps   . History of hematuria   . DJD (degenerative joint disease)   . History of osteoporosis   . Memory loss   . Anxiety   . Lipoma   . Hyperlipidemia    Current Outpatient Prescriptions on File Prior to Visit  Medication Sig Dispense Refill  . aspirin 81 MG tablet Take 81 mg by mouth daily.    [name] azelastine (ASTELIN) 0.1 % nasal spray Place 1-2 sprays into both nostrils 2 (two) times daily as needed for rhinitis. Use in each nostril as directed 30 mL 5  . Calcium Carbonate-Vitamin D (CALCIUM 600+D) 600-400 MG-UNIT per tablet Take 1 tablet by mouth daily.    . Cyanocobalamin (VITAMIN B 12 PO) Take 1 tablet by mouth daily.    [name] donepezil (ARICEPT) 10 MG tablet Take 1 tablet (10 mg total) by mouth at bedtime. 90 tablet 4  . fish oil-omega-3 fatty acids 1000 MG capsule Take 1 g by mouth daily.    [name] glucosamine-chondroitin 500-400 MG tablet Take 1 tablet by mouth 3 (three) times daily.    . hydrochlorothiazide (HYDRODIURIL) 25 MG tablet Take 1 tablet (25 mg total) by mouth daily. 90 tablet 3  . memantine (NAMENDA) 10 MG tablet Take 1 tablet (10 mg total) by mouth 2 (two) times daily. (Patient taking differently: Take 10 mg by mouth daily. ) 180 tablet 4  . Multiple Vitamins-Minerals (MULTIVITAMIN &  MINERAL PO) Take 1 tablet by mouth daily.    . simvastatin (ZOCOR) 20 MG tablet TAKE 1 TABLET (20 MG TOTAL) BY MOUTH AT BEDTIME. 90 tablet 3  . traMADol (ULTRAM) 50 MG tablet Take 1 tablet (50 mg total) by mouth 3 (three) times daily as needed. 90 tablet 3  . valsartan (DIOVAN) 160 MG tablet TAKE 1 TABLET (160 MG TOTAL) BY MOUTH DAILY. 90 tablet 1  . verapamil (CALAN-SR) 240 MG CR tablet TAKE 1 TABLET (240 MG TOTAL)  BY MOUTH AT BEDTIME. 90 tablet 1  . chlorzoxazone (PARAFON) 500 MG tablet Take 1 tablet (500 mg total) by mouth 3 (three) times daily as needed for muscle spasms. (Patient not taking: Reported on 09/21/2015) 50 tablet 0   No current facility-administered medications on file prior to visit.      Review of Systems Constitutional:   No  weight loss, night sweats,  + Fevers, chills, fatigue, or  lassitude.  HEENT:   No headaches,  Difficulty swallowing,  Tooth/dental problems, or  Sore throat,                No sneezing, itching, ear ache,  +nasal congestion, post nasal drip,   CV:  No chest  pain,  Orthopnea, PND, swelling in lower extremities, anasarca, dizziness, palpitations, syncope.   GI  No heartburn, indigestion, abdominal pain, nausea, vomiting, diarrhea, change in bowel habits, loss of appetite, bloody stools.   Resp:    No chest wall deformity  Skin: no rash or lesions.  GU: no dysuria, change in color of urine, no urgency or frequency.  No flank pain, no hematuria   MS:  No joint pain or swelling.  No decreased range of motion.  No back pain.  Psych:  No change in mood or affect. No depression or anxiety.  No memory loss.         Objective:   Physical Exam  . Filed Vitals:   09/21/15 1423  BP: 138/78  Pulse: 55  Temp: 97.4 F (36.3 C)  TempSrc: Oral  Height: 5\' 2"  (1.575 m)  Weight: 135 lb (61.236 kg)  SpO2: 95%   GEN: A/Ox3; pleasant , NAD, elderly   HEENT:  [place]/AT,  EACs-clear, TMs-wnl, NOSE-clear, THROAT-clear, no lesions, no postnasal drip  or exudate noted.   NECK:  Supple w/ fair ROM; no JVD; normal carotid impulses w/o bruits; no thyromegaly or nodules palpated; no lymphadenopathy.  RESP  Clear  P & A; w/o, wheezes/ rales/ or rhonchi.no accessory muscle use, no dullness to percussion  CARD:  RRR, no m/r/g  , no peripheral edema, pulses intact, no cyanosis or clubbing.  GI:   Soft & nt; nml bowel sounds; no organomegaly or masses detected.  Musco: Warm bil, no deformities or joint swelling noted.   Neuro: alert, no focal deficits noted.    Skin: Warm, no lesions or rashes  Parneet Glantz NP-C  [facility]  09/21/2015       Assessment & Plan:

## 2015-09-21 NOTE — Addendum Note (Signed)
Addended by: Virl Cagey on: 09/21/2015 03:32 PM   Modules accepted: Orders

## 2015-09-21 NOTE — Patient Instructions (Addendum)
Tamiflu Twice daily  For 5 days.  Mucinex DM Twice daily  As needed  Cough/congestion.  Fluids and rest .  Chest xray today  Tylenol As needed   Follow up with [name]  As planned and As needed   Please contact office for sooner follow up if symptoms do not improve or worsen or seek emergency care

## 2015-09-22 ENCOUNTER — Telehealth: Payer: Self-pay | Admitting: Adult Health

## 2015-09-22 NOTE — Telephone Encounter (Signed)
Result Notes     Notes Recorded by Osa Craver, CMA on 09/21/2015 at 5:20 PM LVM for pt to return call. ------  Notes Recorded by Melvenia Needles, NP on 09/21/2015 at 5:11 PM Lungs clear  No sign of PNA  Cont w/ ov recs  Please contact office for sooner follow up if symptoms do not improve or worsen or seek emergency care     Pt is aware of results. Nothing further was needed.

## 2015-11-01 DIAGNOSIS — H353221 Exudative age-related macular degeneration, left eye, with active choroidal neovascularization: Secondary | ICD-10-CM | POA: Diagnosis not present

## 2015-11-01 DIAGNOSIS — H2511 Age-related nuclear cataract, right eye: Secondary | ICD-10-CM | POA: Diagnosis not present

## 2015-11-01 DIAGNOSIS — H353123 Nonexudative age-related macular degeneration, left eye, advanced atrophic without subfoveal involvement: Secondary | ICD-10-CM | POA: Diagnosis not present

## 2015-11-01 DIAGNOSIS — H40033 Anatomical narrow angle, bilateral: Secondary | ICD-10-CM | POA: Diagnosis not present

## 2015-11-01 DIAGNOSIS — H25011 Cortical age-related cataract, right eye: Secondary | ICD-10-CM | POA: Diagnosis not present

## 2015-11-01 LAB — HM DIABETES EYE EXAM

## 2015-11-02 DIAGNOSIS — H353133 Nonexudative age-related macular degeneration, bilateral, advanced atrophic without subfoveal involvement: Secondary | ICD-10-CM | POA: Diagnosis not present

## 2015-11-02 DIAGNOSIS — H35351 Cystoid macular degeneration, right eye: Secondary | ICD-10-CM | POA: Diagnosis not present

## 2015-11-02 DIAGNOSIS — H353212 Exudative age-related macular degeneration, right eye, with inactive choroidal neovascularization: Secondary | ICD-10-CM | POA: Diagnosis not present

## 2015-11-02 DIAGNOSIS — H2511 Age-related nuclear cataract, right eye: Secondary | ICD-10-CM | POA: Diagnosis not present

## 2015-11-16 DIAGNOSIS — H2511 Age-related nuclear cataract, right eye: Secondary | ICD-10-CM | POA: Diagnosis not present

## 2015-11-30 DIAGNOSIS — H35351 Cystoid macular degeneration, right eye: Secondary | ICD-10-CM | POA: Diagnosis not present

## 2015-11-30 DIAGNOSIS — H353133 Nonexudative age-related macular degeneration, bilateral, advanced atrophic without subfoveal involvement: Secondary | ICD-10-CM | POA: Diagnosis not present

## 2015-11-30 DIAGNOSIS — H59031 Cystoid macular edema following cataract surgery, right eye: Secondary | ICD-10-CM | POA: Diagnosis not present

## 2015-11-30 DIAGNOSIS — H26492 Other secondary cataract, left eye: Secondary | ICD-10-CM | POA: Diagnosis not present

## 2015-12-01 ENCOUNTER — Encounter: Payer: Self-pay | Admitting: Pulmonary Disease

## 2015-12-21 DIAGNOSIS — H353212 Exudative age-related macular degeneration, right eye, with inactive choroidal neovascularization: Secondary | ICD-10-CM | POA: Diagnosis not present

## 2015-12-21 DIAGNOSIS — H353133 Nonexudative age-related macular degeneration, bilateral, advanced atrophic without subfoveal involvement: Secondary | ICD-10-CM | POA: Diagnosis not present

## 2015-12-21 DIAGNOSIS — H59031 Cystoid macular edema following cataract surgery, right eye: Secondary | ICD-10-CM | POA: Diagnosis not present

## 2015-12-21 DIAGNOSIS — H35351 Cystoid macular degeneration, right eye: Secondary | ICD-10-CM | POA: Diagnosis not present

## 2015-12-21 DIAGNOSIS — H26492 Other secondary cataract, left eye: Secondary | ICD-10-CM | POA: Diagnosis not present

## 2015-12-21 LAB — HM DIABETES EYE EXAM

## 2015-12-22 ENCOUNTER — Telehealth: Payer: Self-pay

## 2015-12-22 ENCOUNTER — Telehealth: Payer: Self-pay | Admitting: Pulmonary Disease

## 2015-12-22 NOTE — Telephone Encounter (Signed)
Spoke with pt and husband, states that they just came from Dr [facility] office (Eye DR) and was told that she needed her BP Meds adjusted.  Pt husband states that they were told her BP needed to be a little higher for her eye pressure - stated that her BP was low but could not remember what the reading was at her appt.  Pt brought back in to an exam room (per CY as SN was in a room - advised to check BP and let patient go) BP checked in both arms and BP was actually high.   Left 138/74 , Right 144/62 Pt states that they might have misunderstood the rec's per Dr [name] and now want to Lenna Gilford to contact him to discuss his plan. Dr [name] [phone]

## 2015-12-22 NOTE — Telephone Encounter (Signed)
Spoke with patient and reminded her she has been on that medication for one year. Advised that any side effects she most likely would have experience a long time ago. Reminded her of her upcoming FU 01/06/16 and offered to move it up. She agreed; rescheduled for next Monday. Requested she arrive 15 min early. She verbalized understanding, appreciation.

## 2015-12-22 NOTE — Telephone Encounter (Signed)
This patient was here today with her husband (who sees Dr. Rexene Alberts). Nadege takes memantine 10mg  BID. She told Dr Rexene Alberts that after the first dose she feels unbalanced and "drugged". We advised her that we would let Dr. Gladstone Lighter nurse know.

## 2015-12-22 NOTE — Telephone Encounter (Addendum)
Per SN: please call Dr Dahlia Bailiff office and request BP parameters that he would like for her eye problem and ask him to send me the ov note please.  We can easily adjust her Calan, Diovan and HCT to manage her BP.  She may need to begin checking and logging her BP daily and see me back in a couple weeks.  Called DrRankin's office and spoke with [facility] who reported that DrRankin is in surgery but she would forward message to him about pt's BP parameters and go ahead and fax the ov note to [phone].  Will await call back from DrRankin's office.  Called spoke with patient and informed her of the above.  Pt okay with this and voiced her understanding.  She is aware the office will contact her with DrRankin's and SN's recs once we have them.  Will route back to myself to ensure follow up.

## 2015-12-23 NOTE — Telephone Encounter (Signed)
I spoke Crystal at Dr Rankin's office She states she spoke with Dr [name] regarding this msg and per Dr [name], he is just concerned about whether or not pt is hypoxic or has OSA

## 2015-12-23 NOTE — Telephone Encounter (Signed)
Dr Radene Ou office is calling back states they are sending her here to be sure she is not hypoxic and sure she does not have sleep apena   [phone]

## 2015-12-24 NOTE — Telephone Encounter (Signed)
Per SN: call pt please and tell her I reviewed Dr Rankin's ov note 12/21/15 and there is no mention of BP issue!  Tell her we called Dr Dahlia Bailiff office and he wants to be sure she is not hypoxemic and doesn't have OSA.  Therefore please schedule ONO and Home Sleep Study.  Thanks.  Called spoke with patient and discussed SN's recs as stated above.  Pt voiced her understanding and is okay with moving forward with testing.  However, she will be traveling out of town (would not inform me as to when) and requested to call the office back regarding these orders and if they can be placed.  Will close message, route to myself and await pt's return call.

## 2015-12-27 ENCOUNTER — Encounter: Payer: Self-pay | Admitting: Diagnostic Neuroimaging

## 2015-12-27 ENCOUNTER — Ambulatory Visit (INDEPENDENT_AMBULATORY_CARE_PROVIDER_SITE_OTHER): Payer: Medicare Other | Admitting: Diagnostic Neuroimaging

## 2015-12-27 VITALS — BP 150/73 | HR 60 | Ht 62.0 in | Wt 138.2 lb

## 2015-12-27 DIAGNOSIS — R413 Other amnesia: Secondary | ICD-10-CM | POA: Diagnosis not present

## 2015-12-27 DIAGNOSIS — G3184 Mild cognitive impairment, so stated: Secondary | ICD-10-CM | POA: Diagnosis not present

## 2015-12-27 MED ORDER — MEMANTINE HCL 10 MG PO TABS: 10.0000 mg | ORAL_TABLET | Freq: Two times a day (BID) | ORAL | Status: DC

## 2015-12-27 MED ORDER — DONEPEZIL HCL 10 MG PO TABS: 10.0000 mg | ORAL_TABLET | Freq: Every day | ORAL | Status: DC

## 2015-12-27 NOTE — Progress Notes (Addendum)
[facility]  PATIENT: Cynthia Mathis DOB: 29-Jun-1933  REFERRING CLINICIAN: Nadel HISTORY FROM: patient and husband REASON FOR VISIT: follow up   HISTORICAL  CHIEF COMPLAINT:  Chief Complaint  Patient presents with  . Memory Loss    rm 6, husbandMariane Masters, MMSE  27  . Follow-up    1 year    HISTORY OF PRESENT ILLNESS:   UPDATE 12/27/15: Since last visit, doing well. Memory issues are stable. Tolerating meds (donepezil and memantine) but takes memantine only 10mg  at bedtime; day time dose caused "fogginess".   UPDATE 01/04/15: Since last visit, started memantine 10mg  BID, could not tolerate, so started taking it daily. No new events. Memory stable.   UPDATE 07/01/14: Since last visit, memory loss has progressed. Now on donepezil 10mg  qhs per [name]. Tolerating without side effects.  UPDATE 04/23/13: Since last visit patient continues to have short-term memory problems. This is when she has conversations with her husband and then cannot remember the details later. She also has some difficulty remembering peoples names after meeting them. Other people have not noticed any significant memory problems. Patient notices these problems primarily herself. Patient denies any significant depression. He does have some anxiety feelings with tense turning feeling in her stomach especially if her husband gets upset with her.  PRIOR HPI (10/07/12): 82 year old right-handed female here for evaluation of memory loss. Patient accompanied by her husband. For past one year patient is having increasing trouble with forgetting things. She's having to rely on taking notes, maintaining a calendar, using lists to remember things. Patient accompanied by her husband, who also has been diagnosed with mild cognitive impairment versus mild dementia by [name]. He takes galantamine, which he says has helped his memory. Other examples include forgetting recent conversations, recent events,  repeating questions over and over. Unfortunately the only informant of this problem is the patient's husband, who also happens to have mild cognitive impairment or dementia. No other family members or friends have noticed this problem in the patient. Otherwise patient is doing well still able to maintain activities of daily living including cooking, cleaning, driving, shopping.   REVIEW OF SYSTEMS: Full 14 system review of systems performed and negative except: memory loss hearing loss light sens heat intolerance loss of vision.    ALLERGIES: Allergies  Allergen Reactions  . Epinephrine Palpitations    REACTION: heart racing    HOME MEDICATIONS: Outpatient Prescriptions Prior to Visit  Medication Sig Dispense Refill  . aspirin 81 MG tablet Take 81 mg by mouth daily.    . Calcium Carbonate-Vitamin D (CALCIUM 600+D) 600-400 MG-UNIT per tablet Take 1 tablet by mouth daily.    . Cyanocobalamin (VITAMIN B 12 PO) Take 1 tablet by mouth daily. Reported on 12/27/2015    . donepezil (ARICEPT) 10 MG tablet Take 1 tablet (10 mg total) by mouth at bedtime. 90 tablet 4  . fish oil-omega-3 fatty acids 1000 MG capsule Take 1 g by mouth daily.    [name] glucosamine-chondroitin 500-400 MG tablet Take 1 tablet by mouth 3 (three) times daily.    . hydrochlorothiazide (HYDRODIURIL) 25 MG tablet Take 1 tablet (25 mg total) by mouth daily. 90 tablet 3  . memantine (NAMENDA) 10 MG tablet Take 1 tablet (10 mg total) by mouth 2 (two) times daily. (Patient taking differently: Take 10 mg by mouth daily. ) 180 tablet 4  . Multiple Vitamins-Minerals (MULTIVITAMIN & MINERAL PO) Take 1 tablet by mouth daily.    . simvastatin (  ZOCOR) 20 MG tablet TAKE 1 TABLET (20 MG TOTAL) BY MOUTH AT BEDTIME. 90 tablet 3  . valsartan (DIOVAN) 160 MG tablet TAKE 1 TABLET (160 MG TOTAL) BY MOUTH DAILY. 90 tablet 1  . verapamil (CALAN-SR) 240 MG CR tablet TAKE 1 TABLET (240 MG TOTAL)  BY MOUTH AT BEDTIME. 90 tablet 1  . azelastine (ASTELIN)  0.1 % nasal spray Place 1-2 sprays into both nostrils 2 (two) times daily as needed for rhinitis. Use in each nostril as directed 30 mL 5  . chlorzoxazone (PARAFON) 500 MG tablet Take 1 tablet (500 mg total) by mouth 3 (three) times daily as needed for muscle spasms. (Patient not taking: Reported on 09/21/2015) 50 tablet 0  . oseltamivir (TAMIFLU) 75 MG capsule Take 1 capsule (75 mg total) by mouth 2 (two) times daily. 10 capsule 0  . traMADol (ULTRAM) 50 MG tablet Take 1 tablet (50 mg total) by mouth 3 (three) times daily as needed. 90 tablet 3   No facility-administered medications prior to visit.    PAST MEDICAL HISTORY: Past Medical History  Diagnosis Date  . Macular degeneration   . Hypertension   . Dyslipidemia   . Borderline diabetes mellitus   . Hypothyroid   . Diverticulosis of colon   . Hx of colonic polyps   . History of hematuria   . DJD (degenerative joint disease)   . History of osteoporosis   . Memory loss   . Anxiety   . Lipoma   . Hyperlipidemia     PAST SURGICAL HISTORY: Past Surgical History  Procedure Laterality Date  . Thyroidectomy  1990    [name]  . Appendectomy  1947  . Colonoscopy    . Mass excision  04/17/2012    Procedure: EXCISION MASS;  Surgeon: [name], MD;  Location: [employer];  Service: General;  Laterality: N/A;    FAMILY HISTORY: Family History  Problem Relation Age of Onset  . Colon cancer Neg Hx   . Stomach cancer Neg Hx   . Stroke Mother     SOCIAL HISTORY:  Social History   Social History  . Marital Status: Married    Spouse Name: Mariane Masters [place]  . Number of Children: 3  . Years of Education: [employer] Sch[employer]   Occupational History  . seamstress    Social History Main Topics  . Smoking status: Former Smoker    Quit date: 07/10/1965  . Smokeless tobacco: Never Used     Comment: 3 cigs per week x 3 years (started in 1964), 12/27/15 does not smoke  . Alcohol Use: 0.0 oz/week    0 Standard drinks  or equivalent per week     Comment: occasionally  . Drug Use: No  . Sexual Activity: Not on file   Other Topics Concern  . Not on file   Social History Narrative   Pt lives at home with her spouse Mariane Masters [place], who has been dx'd with mild cognitive impairment vs. mild dementia, former pt of [name]). She does not use caffeine, if so, rarely.     PHYSICAL EXAM  Filed Vitals:   12/27/15 1130  BP: 150/73  Pulse: 60  Height: 5\' 2"  (1.575 m)  Weight: 138 lb 3.2 oz (62.687 kg)   Body mass index is 25.27 kg/(m^2).  MMSE - Mini Mental State Exam 12/27/2015 01/04/2015  Orientation to time 3 4  Orientation to Place 5 5  Registration 3 3  Attention/ Calculation 4 5  Recall 2 2  Language- name 2 objects 2 2  Language- repeat 1 1  Language- follow 3 step command 3 3  Language- read & follow direction 1 1  Write a sentence 1 1  Copy design 1 1  Total score 26 28     Montreal Cognitive Assessment  07/01/2014  Visuospatial/ Executive (0/5) 3  Naming (0/3) 3  Attention: Read list of digits (0/2) 2  Attention: Read list of letters (0/1) 1  Attention: Serial 7 subtraction starting at 100 (0/3) 3  Language: Repeat phrase (0/2) 1  Language : Fluency (0/1) 1  Abstraction (0/2) 2  Delayed Recall (0/5) 0  Orientation (0/6) 4  Total 20  Adjusted Score (based on education) 21    GENERAL EXAM: Patient is in no distress  CARDIOVASCULAR: Regular rate and rhythm, no murmurs, no carotid bruits  NEUROLOGIC: MENTAL STATUS: awake, alert, language fluent, comprehension intact, naming intact; AFT 11. NO FRONTAL RELEASE SIGNS. CRANIAL NERVE: pupils equal and reactive to light, visual fields full to confrontation, extraocular muscles intact, no nystagmus, facial sensation and strength symmetric, uvula midline, shoulder shrug symmetric, tongue midline. MOTOR: normal bulk and tone, full strength in the BUE, BLE SENSORY: normal and symmetric to light touch, temperature,  vibration. COORDINATION: finger-nose-finger, fine finger movements normal REFLEXES: deep tendon reflexes present and symmetric GAIT/STATION: narrow based gait; able to walk tandem; romberg is negative    DIAGNOSTIC DATA (LABS, IMAGING, TESTING) - I reviewed patient records, labs, notes, testing and imaging myself where available.  Lab Results  Component Value Date   WBC 10.4 08/12/2015   HGB 14.6 08/12/2015   HCT 43.9 08/12/2015   MCV 92.0 08/12/2015   PLT 234.0 08/12/2015      Component Value Date/Time   NA 141 08/12/2015 0847   K 4.2 08/12/2015 0847   CL 103 08/12/2015 0847   CO2 29 08/12/2015 0847   GLUCOSE 94 08/12/2015 0847   BUN 18 08/12/2015 0847   CREATININE 0.84 08/12/2015 0847   CALCIUM 9.3 08/12/2015 0847   PROT 7.0 08/11/2014 0810   ALBUMIN 4.2 08/11/2014 0810   AST 19 08/11/2014 0810   ALT 14 08/11/2014 0810   ALKPHOS 53 08/11/2014 0810   BILITOT 0.7 08/11/2014 0810   GFRNONAA 67* 04/15/2012 1500   GFRAA 78* 04/15/2012 1500   Lab Results  Component Value Date   CHOL 155 08/12/2015   HDL 60.00 08/12/2015   LDLCALC 80 08/12/2015   TRIG 71.0 08/12/2015   CHOLHDL 3 08/12/2015   Lab Results  Component Value Date   HGBA1C 6.2 09/17/2012   Lab Results  Component Value Date   VITAMINB12 819 10/07/2012   Lab Results  Component Value Date   TSH 0.68 08/12/2015    10/16/12 MRI brain - mild atrophy and mild chronic small vessel ischemic disease.   ASSESSMENT AND PLAN  82 y.o. year old female  has a past medical history of Macular degeneration; Hypertension; Dyslipidemia; Borderline diabetes mellitus; Hypothyroid; Diverticulosis of colon; colonic polyps; History of hematuria; DJD (degenerative joint disease); History of osteoporosis; Memory loss; Anxiety; Lipoma; and Hyperlipidemia. here with history of short-term memory problems since March 2012. Neurologic examination today is unremarkable.   Cognitive testing has slightly worsened since 2014: MMSE  29/30 --> 28/30 --> 28/30 --> 26/30 MOCA 21/30 --> 23/30 --> 21/30.  Dx: mild cognitive impairment vs mild dementia  No diagnosis found.    PLAN: I spent 25 minutes of face to face time with patient. Greater than  50% of time was spent in counseling and coordination of care with patient. In summary we discussed:  - continue memantine + donepezil - safety, supervision, planning issues reviewed  Meds ordered this encounter  Medications  . memantine (NAMENDA) 10 MG tablet    Sig: Take 1 tablet (10 mg total) by mouth 2 (two) times daily.    Dispense:  180 tablet    Refill:  4  . donepezil (ARICEPT) 10 MG tablet    Sig: Take 1 tablet (10 mg total) by mouth at bedtime.    Dispense:  90 tablet    Refill:  4   Return in about 1 year (around 12/26/2016).     Penni Bombard, MD [id number], [id number] AM Certified in Neurology, Neurophysiology and Neuroimaging  [facility] [address], [place], [place] [address] [phone]

## 2015-12-27 NOTE — Patient Instructions (Signed)

## 2016-01-03 ENCOUNTER — Encounter: Payer: Self-pay | Admitting: Pulmonary Disease

## 2016-01-04 ENCOUNTER — Ambulatory Visit: Payer: Medicare Other | Admitting: Diagnostic Neuroimaging

## 2016-01-07 DIAGNOSIS — Z1231 Encounter for screening mammogram for malignant neoplasm of breast: Secondary | ICD-10-CM | POA: Diagnosis not present

## 2016-01-07 NOTE — Telephone Encounter (Signed)
LMTCB to see if pt is ready for orders for ONO and SST to be placed

## 2016-01-20 ENCOUNTER — Encounter: Payer: Self-pay | Admitting: Pulmonary Disease

## 2016-01-20 NOTE — Telephone Encounter (Signed)
LMTCB for pt 

## 2016-01-24 ENCOUNTER — Other Ambulatory Visit: Payer: Self-pay | Admitting: Pulmonary Disease

## 2016-02-09 ENCOUNTER — Ambulatory Visit (INDEPENDENT_AMBULATORY_CARE_PROVIDER_SITE_OTHER): Payer: Medicare Other | Admitting: Pulmonary Disease

## 2016-02-09 ENCOUNTER — Encounter: Payer: Self-pay | Admitting: Pulmonary Disease

## 2016-02-09 VITALS — BP 132/70 | HR 58 | Temp 98.0°F | Ht 62.0 in | Wt 138.1 lb

## 2016-02-09 DIAGNOSIS — H3530 Unspecified macular degeneration: Secondary | ICD-10-CM

## 2016-02-09 DIAGNOSIS — M159 Polyosteoarthritis, unspecified: Secondary | ICD-10-CM

## 2016-02-09 DIAGNOSIS — E785 Hyperlipidemia, unspecified: Secondary | ICD-10-CM | POA: Diagnosis not present

## 2016-02-09 DIAGNOSIS — R7301 Impaired fasting glucose: Secondary | ICD-10-CM | POA: Diagnosis not present

## 2016-02-09 DIAGNOSIS — M150 Primary generalized (osteo)arthritis: Secondary | ICD-10-CM

## 2016-02-09 DIAGNOSIS — R413 Other amnesia: Secondary | ICD-10-CM

## 2016-02-09 DIAGNOSIS — E039 Hypothyroidism, unspecified: Secondary | ICD-10-CM

## 2016-02-09 DIAGNOSIS — I10 Essential (primary) hypertension: Secondary | ICD-10-CM

## 2016-02-09 NOTE — Patient Instructions (Signed)
Today we updated your med list in our EPIC system...    Continue your current medications the same...  For the night time leg cramps>    Try the [employer] recommendation for a glass of TONIC water at bedtime or a tsp of yellow mustard!  For the artthritis pains>    Try the OTC Tylenol or aleve to see if these help your discomfort along w/ rest/ heat/ etc...  Call for any questions...  Let's plan a follow up visit in 71mo, sooner if needed for problems.Marland KitchenMarland Kitchen

## 2016-02-09 NOTE — Progress Notes (Signed)
Subjective:    Patient ID: Cynthia Mathis, female    DOB: 06-09-1933, 82 y.o.   MRN: [id number]  HPI 82 y/o WF here for a follow up visit... she has mult med problems as noted below ...  ~  SEE PREV EPIC NOTES FOR OLDER DATA >>   ~  sees DrPerry for GI- divertics, polyps, hems & a bout of diverticulitis in Mar08...  ~  sees Dr Risa Grill Urology w/ microscopic hematuria and a neg eval 5/08... 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 ~  sees DrLLomax for Derm w/ alopecia- offered Rogaine but decided against it... ~  Sees DrPenumali for Neuro w/ memory loss, early stage dementia...   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  September 17, 2012:  concerned about her memory; husb has been under the care of DrLove for >11yrs w/ mild cognitive impairment  LABS 3/14:  Chems- wnl w/ BS=106, A1c=6.2;  TSH=0.37;  Sed=14...    January 23, 2013:  visit w/ DrPenumalli 3/14> her MMSE was 29/30 & he planned to check MRI Brain & B12 level, he did not rec starting meds yet (husb takes Razadyne & feels it helps him).  ADDENDUM 04/15/13 >> pt called & wants off Simva20 after reading something about memory loss=> change to Atorva20 (she never did).  CXR 1/15 showed heart at upper lim of norm, lungs clear (mild dev trachea to right c/w enlarged left lobe of thyroid- no change).  EKG 1/15 showed SBrady, rate44, NSSTTW changes.  LABS 1/15:  FLP- at goals on Simva20;  Chems- wnl;  CBC- wnl;  TSH=0.59;  VitD=54.  BMD 08/22/13>  Lowest Tscore= -0.9 in APspine... She is off bisphos therapy for several yrs now & remains improved...   NOTE:  She wants to try ARICEPT10 start 1/2 => 1 tab daily as trial; Pulse is low at 48 & we decided to stop ToprolXL & replace it w/ VALSARTAN160/d.  ~  February 04, 2014:  22mo ROV & [name] has several minor somatic complaints but overall stable & feels well;  our scale shows 13# wt loss to 132# today- she states good appetite, eating satis, "I get lots of exercise playng pickle ball" she says;  She notes that feet/ toes are cold (no pain, no sores), unusual feeling in legs at night, sweats alot, runny nose, etc;  She reallly dosen't want more meds but will try Astepro for the rhinorrhea...      Last OV we stopped her Metoprolol due to bradycardia & substituted Diovan160 for her BP> now on this +CalanSR240 & HCT25; BP= 120/60 & denies HA, CP, palpit, SOB, edema, etc...     She remains on Simva20 w/ good control of Lipids...    She remains on Aricept10 & feels that her memory is stable, denies deterioration...  We reviewed prob list, meds, xrays and labs> see below for updates >> MEDS refilled per request...   LABS, Mammogram, CXR, BMD, EKG> all reviewed ...   ~  August 10, 2014:  59mo ROV & it took quite a bit of time getting her med list straight today- she did not know her meds, her card w/ meds written on it was old & out of date, Epic meds were not up to date either & we called Pharm- pt is reminded to bring all med bottles to every office visit... She tells me that [facility] wants  her to get a hearing test & we will refer to ENT office audiologist... She has mult minor somatic complaints- runny nose, some numbness intermit in legs/ twitching, pain in neck muscles, stools are very soft, some urgency; we discussed antihist prn, apply heat to muscles, incr fiber, consider Neuro eval for neuropathy=> no new meds at this point...    Macular Degen> followed by DrHecker & DrRankin; she is getting shot Qmo or so for the MD & sched for bilat cat surg soon...    HBP> on ASA81, Valsartan160, Verap240, HCT25; BP= 128/76 & she denies HA, CP, palpit, dizzy, SOB, edema, etc...    Hyperlipid> on Simva20; FLP 2/16 show TChol 161, TG 107, HDL 61, LDL 79; continue same meds...    Hx Impaired FBS> Hx BS in the 130 range prev, but all readings ~100 x yrs now;  Labs 2/16 showed  BS=99    Hypothy> hx multinod thyroid x yrs; had thyroidectomy 1990 by DrWeatherly; she weaned off Thyroid hormone yrs ago & has remained clinically & biochem euthyroid; Labs 2/16 showed TSH= 0.76    GI- Divertics, Polyps> hx diverticulitis in 2008- followed by DrPerry; last colonoscopy 4/13 showed severe diverticulosis w/o inflamm or polyps seen...    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 Labs 1/15 showed Hg= 14.7 & LFTs are WNL.    DJD, Osteoporosis> on Ca, MVI, Glucosamine; she uses OTC analgesic meds as needed; see BMDs below- on Bisphos drug holiday since 7/11 & due for f/u BMD...    Memory Loss> eval by [facility] 4/14, DrPenumalli; felt to have mild cognitive impairment vs normal aging, then w/ progressive impairment & they have her on Donepezil10 & Namenda10Bid now... We reviewed prob list, meds, xrays and labs> see below for updates >>   LABS 2/16:  FLP- at goals on Simva20;  Chems- wnl;  CBC- wnl;  TSH=0.76...  ~  February 08, 2015:  60mo ROV & [name] has a list of complaints today> runny nose (offered Zyrtek & Astelin but she doesn't want more meds), cold feet, hot & sweaty, thirsty, right knee pain, stepped on a hornets nest w/ 12 stings and reaction; she also reported an episode of syncope while visiting daugh in [place]- she was taken to daughter's doctor & reports that everything checked out OK- we do not have the data... She remains very active & plays "pickle ball" everyday.    Macular Degen> followed by DrHecker & DrRankin; she is getting shot Qmo or so for the MD & sched for bilat cat surg soon...    Hearing Loss> she saw DrGore, ENT w/ audiology showing bilat SNHL...    HBP> on ASA81, Valsartan160, Verap240, HCT25; BP= 132/74 & she denies HA, CP, palpit, dizzy, SOB, edema, etc...    Hyperlipid> on Simva20; FLP 2/16 show TChol 161, TG 107, HDL 61, LDL 79; continue same meds...    Hx Impaired  FBS> Hx BS in the 130 range prev, but all readings ~100 x yrs now;  Labs 2/16 showed BS=99    Hypothy> hx multinod thyroid x yrs; had thyroidectomy 1990 by DrWeatherly; she weaned off Thyroid hormone yrs ago & has remained clinically & biochem euthyroid; Labs 2/16 showed TSH= 0.76    GI- Divertics, Polyps> hx diverticulitis in 2008- followed by DrPerry; last colonoscopy 4/13 showed severe diverticulosis w/o inflamm or polyps seen...    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 Labs 1/15 showed Hg= 14.7 & LFTs are WNL.    DJD, Osteoporosis> on Ca, MVI, Glucosamine; she uses OTC analgesic meds as needed; see BMDs below- on Bisphos drug holiday since 7/11 & due for f/u BMD...    Memory Loss> eval by [facility] 4/14, DrPenumalli; felt to have mild cognitive impairment vs normal aging, then w/ progressive impairment & they have her on Donepezil10 & Namenda10Bid now... We reviewed prob list, meds, xrays and labs>>   CT Abd 10/01/14>  Severe diverticulosis & mild acute sigm diverticulitis + inflammed distal rectum/anus c/w acute proctitis; 70mm solitary cystic lesion in body of pancreas, gallstones and atherosclerotic changes in Ao... IMP/PLAN >>  [name] appears stable, but has mult somatic complaints, meanwhile she does not want any new meds; she will request records from [place] in [place] (nevetr received) & bring these to [place] when avail; continue same meds for now & ROV 12mo...  ~  May 06, 2015:  51mo ROV & add-on appt requested by pt due to right knee pain and LBP for the last 1wk she says; no known trauma or injury, and she denies prev problems x occas soreness; she thinks that she may have had a shot in her knee once several yrs ago; as for the back pain- it is dull, in center of back w/o radiation...     EXAM shows Afeb, VSS, O2sat=97%;  HEENT- neg, mallampati1;  Chest- clear w/o w/r/r;  Heart- RR w/o m/r/g;   Abd- soft, nontender, neg;  Ext- +crepitus in right knee, w/o c/c/e;  Neuro- intact...   XRay right knee 05/06/15> osteoarthtitis w/ medial joint space compartment narrowing with milder patellofemoral joint space compartment narrowing. Marginal osteophytes project from all 3 compartments, mild spurring along the tibial spine, no joint effusion; Bones are demineralized...  XRay LS spine 05/06/15>  Disc space narrowing at L2-3 with endplate sclerosis. Mild osteophytic changes are seen. Diffuse aortic calcifications are noted. NAD... IMP/PLAN>>  [name] is c/o 1 wk hx right knee & LBP; exam shows good ROM right knee w/ crepitation; XRays indicate degen arthritis; we discussed Rx w/ Tramadol+Tylenol, neoprene sleeve for knee and rest/ heat for back; I indicated next step is to refer to Ortho for shot if symptoms don't respond to Rx...   ~  August 11, 2015:  32mo ROV & Nasirah is stable- c/o runny nose, LBP, shoulder pain, right knee pain; she has tried Tramadol w/o much benefit & we discussed adding topical heat, ParafonTid 7 refer to Ortho for further eval... We reviewed the following medical problems during today's office visit >>     Macular Degen> followed by DrHecker & DrRankin; she received shots MD & had bilat cat surg but we do not have notes from them...    Hearing Loss> she saw DrGore, ENT w/ audiology showing bilat SNHL...    HBP> on ASA81, Valsartan160, Verap240, HCT25; BP= 142/68 & she denies HA, CP, palpit, dizzy, SOB, edema, etc...    Hyperlipid> on Simva20; FLP 2/17 show TChol 155, TG 71, HDL 60, LDL 80; continue same meds...    Hx Impaired FBS> Hx BS in the 130 range prev, but all readings ~100 x yrs now;  Labs 2/17 showed BS=94    Hypothy> hx multinod thyroid x yrs; had thyroidectomy 1990 by DrWeatherly; she weaned off Thyroid hormone yrs ago & has remained clinically & biochem euthyroid; Labs 2/17 showed TSH= 0.68    GI- Divertics, Polyps>  hx diverticulitis in 2008- followed by  DrPerry; last colonoscopy 5/16 showed severe diverticulosis w/o inflamm or polyps seen...    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 Labs 2/17 showed Hg= 14.6 & prev LFTs are WNL.    DJD, Osteoporosis> on Ca, MVI, Glucosamine; she uses OTC analgesic meds as needed; see BMDs below- on Bisphos drug holiday since 7/11 & due for f/u BMD... Labs 2/17 w/ VitD= 44    Memory Loss> eval by [facility] 4/14, DrPenumalli; felt to have mild cognitive impairment vs normal aging, then w/ progressive impairment & they have her on Donepezil10 & Namenda10Bid now... EXAM shows Afeb, VSS, O2sat=98%;  HEENT- neg, mallampati1;  Chest- clear w/o w/r/r;  Heart- RR w/o m/r/g;  Abd- soft, nontender, neg;  Ext- +crepitus in right knee, w/o c/c/e;  Neuro- intact...   LABS 08/11/15>  FLP- all parameters at goals on Simva20;  Chems- wnl;  CBC- wnl;  TSH=0.68;  VitD=44...  IMP/PLAN>>  [name] is stable- we discussed trying heat, Tramadol, Parafon for her arthritic & LBP complaints; we will refer to Ortho; rec routine ROV in 63mo...   ~  February 09, 2016:  7mo ROV & [name] is stable ovewrall but persists w/ mult somatic complaints like her runny nose (Astelin rx- offered alternative but she does not want new meds); she still has LBP, shoulder pain, knee pain- on Parafon, Tramadol, & heat but she did not go to Ortho for further eval as rec last visit; she is also c/o difficulty hearing & has hearing aides [facility] but says it isn't right- offered 2nd opinion from ENT & she will let me know;  She also c/o noct leg cramps- we reviewed the usual recs for tonic water vs yellow mustard;  Finally she is also c/o visual difficulties- she had cat surg by DrHecker, and is followed by retina specialist DrRankin w/ macular degen s/p shots...    Macular Degen> followed by DrHecker & DrRankin; she received shots for MD & had bilat cat surg but we do not have  notes from them...    Hearing Loss> she saw DrGore, ENT w/ audiology showing bilat SNHL, she has bilat hearing aides but notes that they are not right...    HBP> on ASA81, Valsartan160, Verap240, HCT25; BP= 132/70 & she denies HA, CP, palpit, dizzy, SOB, edema, etc...    Hyperlipid> on Simva20; FLP 2/17 show TChol 155, TG 71, HDL 60, LDL 80; continue same meds...    Hx Impaired FBS> Hx BS in the 130 range prev, but all readings ~100 x yrs now on diet alone;  Labs 2/17 showed BS=94    Hypothy> hx multinod thyroid x yrs; had thyroidectomy 1990 by DrWeatherly; she weaned off Thyroid hormone yrs ago & has remained clinically & biochem euthyroid; Labs 2/17 showed TSH= 0.68    GI- Divertics, Polyps> hx diverticulitis in 2008- followed by DrPerry; last colonoscopy 5/16 showed severe diverticulosis w/o inflamm or polyps seen, advised Miralax etc...    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 Labs 2/17 showed Hg= 14.6 & prev LFTs are WNL.    DJD, Osteoporosis> on Ca, MVI, Glucosamine; she uses OTC analgesic meds as needed; see BMDs below- on Bisphos drug holiday since 7/11 & due for f/u BMD... Labs 2/17 w/ VitD= 44    Memory Loss> eval by [facility] 4/14,  DrPenumalli & recent f/u 12/2015 (MMSE=26) felt to have mild cognitive impairment vs normal aging, then w/ progressive impairment & they have her on Donepezil10 & Namenda10Bid.  EXAM shows Afeb, VSS, O2sat=98%;  HEENT- neg, mallampati1;  Chest- clear w/o w/r/r;  Heart- RR w/o m/r/g;  Abd- soft, nontender, neg;  Ext- +crepitus in right knee, w/o c/c/e;  Neuro- early dementia, no focal neuro changes...  IMP/PLAN>>  Allien has mult somatic complaints and we discussed each (again) & I wonder about relation to her dementia; she will continue current meds and we reviewed tonic water vs yellow mustard for the noct leg cramps & wrote this down on her AVS; we plan ROV in 96mo w/  f/u CXR, EKG, Fasting labs... Note: >50% of this 6min visit was spent in counseling & coordination of care...           Problem List:  MACULAR DEGENERATION (ICD-362.50) - eval by DrHecker w/ foveal telangiectasia, macular drusen, macular degeneration, and cataracts- being followed regularly... vision is preserved so far... ~  11/11:  f/u DrHecker- stable age related mac degen, no retinopathy. ~  11/12:  f/u DrHecker- has advanced bilat AMD, sent to [facility], we don't have his notes... ~  She has been getting shots in the eyes for macular degen, we do not have notes from [facility]... ~  2/16: she tells me that she continues to receive the shots; she is also sched for bilat cat surg by DrHecker...  HYPERTENSION (ICD-401.9) >>  ~  on VERAPAMIL 240mg /d,  TOPROL-XL 25mg /d,  HCTZ 25mg /d... Prev on Lisinopril but this was stopped due to throat clearing tic & mild cough, plus K=6.0 at the time (all improved off the ACE)... ~  8/10: borderline BP here and she will monitor at home carefully & call if not <150/90. ~  7/11: she reports that she incr the Lisinopril to 1&1/2 tabs due to BP~150+, now better... ~  7/12:  BP stable on Verap240 & Lisinopril 30mg /d> BP= 140/68 today> denies HA, fatigue, visual changes, CP, palipit, dizziness, syncope, dyspnea, edema, etc... ~  1/13:  BP= 160/90 (didn't take med today); states BP are ok at home; continue same meds + low sodium etc... ~  CXR 1/13 showed norm heart size, clear lungs, mild thor spondylosis, NAD.[name]. ~  7/13:  BP= 152/82 & she notes some troublesome throat clearing & mild cough; we decided to STOP the ACE & switch to LOSARTAN 100mg /d, monitor her symptoms and her blood presure... ~  CXR 9/13 showed calcif Ao, clear lungs, suspect left thyroid enlargement, DJD in Tspine... ~  EKG 10/13 showed SBrady, rate58, wnl, NAD... ~  11/13: she had long convoluted hx of BP med changes- see above- now on Verap240, MetopER25, HCT25, & BP= 126/72; we discussed options  but she is content to continue these same meds for now & watch BP... ~  3/14: on ASA81, Metop25, Verap240, HCT25; BP= 140/80 & she denies HA, CP, palpit, dizzy, SOB, edema, etc. ~  1/15: on ASA81, Metop25, Verap240, HCT25; BP= 140/82 & she remains essen asymptomatic; she is bradycardic & we decided to stop ToprolXL & replace it w/ [id number] ~  CXR 1/15 showed heart at upper lim of norm, mild dev of trach to right, clear lungs, NAD...  ~  EKG 1/15 showed SBrady, rate44, NSSTTWA, NAD.[name]. ~  7/15: on ASA81, Diovan160, Verap240, HCT25; BP= 120/60 & she is essentially asymptomatic, pulse=60, continue same meds... ~  2/16: on same meds & BP= 128/76, she remains asymptomatic.Marland KitchenMarland Kitchen  DYSLIPIDEMIA (ICD-272.4) - on ZOCOR 20mg /d... plus doing well on diet... ~  [place] 6/09 showed TChol 144, TG 140, HDL 39, LDL 77... continue same. ~  FLP 2/10 showed TChol 164, TG 77, HDL 54, LDL 95 ~  FLP 8/10 showed TChol 138, TG 51, HDL 48, LDL 80 ~  FLP 1/11 showed TChol 155, TG 100, HDL 48, LDL 87 ~  FLP 7/11 showed TChol 158, TG 74, HDL 55, LDL 89 ~  FLP 1/12 showed TChol 155, TG 101, HDL 50, LDL 85 ~  FLP 1/13 on Simva20 showed TChol 169, TG 118, HDL 56, LDL 89  ~  FLP 1/15 on Simva20 showed TChol 155, TG 122, HDL 50, LDL 81 ~  FLP 2/16 on Simva20 showed TChol 161, TG 107, HDL 61, LDL 79; continue same meds.  Hx Impaired Fasting Glucose >> on diet alone... FBS @ home betw 120-130 recently... she is on a good diet and weight is down at 139# today (63"  BMI= 24)... ~  labs 3/09 showed FBS= 119... subseq HgA1c 5/09 was 6.4... diet Rx. ~  labs 8/09 showed BS= 106, HgA1c= 5.9.Marland KitchenMarland Kitchen rec- same. ~  labs 2/10 showed BS= 102, A1c= 5.9 ~  labs 1/11 showed BS= 98 ~  labs 1/12 showed BS= 95 ~  Labs 1/13 showed BS= 101 ~  Labs 3/14 showed BS=106, A1c=6.2 ~  Labs 1/15 showed BS= 103 ~  Labs 2/16 showed FBS= 99  HYPOTHYROIDISM (ICD-244.9) - off Synthroid now...  clinically & biochemically euthyroid... remote hx of overactive thyroid  many yrs ago in [place] (we don't have records)... she had an eval by DrKohut in 1987 w/ familial multinodular goiter w/ neg FNA... started on Rx but goiter enlarged- had thyroidectomy 1990 by DrWeatherly... ~  labs 3/09 showed TSH = 0.35 (0.35-5.5)... we have slowly decr her dose to 31mcg/d... ~  labs 8/09 showed TSH= 0.33... rec- decr the Synth50 to 1/2 tab daily. ~  labs 2/10 showed =TSH= 0.54 and Synthroid stopped... ~  labs 4/10 off med showed TSH= 0.59 ~  labs 8/10 showed TSH= 0.60 ~  labs 1/11 showed TSH= 0.60 ~  labs 7/11 showed TSH= 0.56 ~  labs 1/12 showed TSH= 0.60 ~  Labs 1/13 showed TSH= 0.58 ~  Labs 3/14 showed TSH= 0.37 ~  Labs 1/15 showed TSH= 0.59 ~  Labs 2/16 showed TSH= 0.76  DIVERTICULOSIS OF COLON (ICD-562.10) COLONIC POLYPS (ICD-211.3) - followed by DrPerry... bout of diverticulitis in Mar08... ~  last colonoscopy 3/08 showed divertics, 29mm polyp, hems... path=hyperplastic, f/u planned 37yrs. ~  F/u colonoscopy 4/13 by DrPerry showed severe diverticulosis w/o inflamm and w/o polyps etc...  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  HEMATURIA, HX OF (ICD-V13.09) - eval 4/08 by DrGrapey w/ neg Sonar, Cysto, cytology...  GYN >> DrKRichardson, Mammograms at [facility] (NEG- 4/15 study)...  DEGENERATIVE JOINT DISEASE (ICD-715.90) - c/o discomfort in her shoulders and knees... uses OTC meds as needed. ~  04/2015:  She presented w/ 1wk hx right knee & LBP; exam showed crepitation in right knee but good ROM & SLR was neg; Rec to use Tramadol+Tylenol, neoprene support sleeve for knee & rest/heat/ exercises for back; we will refer to Ortho for shots if not responding...   Hx of OSTEOPOROSIS (ICD-733.00) - on Fosamax 70/wk thru 7/11, Caltrate  1/d, MVI, Vit D 1000/d... ~  initial  BMD 10/01 showed TScores -0.2  to -3.2 in the spine...  ~  f/u studies w/ steady improvement on Fosamax therapy... ~  BMD 3/09 w/ TScores -0.1 to -1.7 in the spine... continue Rx. ~  BMD 7/11 showed TScores -2.0 Spine, and -0.3 in right FemNeck... we opted for Bisphos drug holiday to start now. ~  Labs 1/13 showed Vit D level = 48, continue same... ~  BMD 2/15 showed WNL Tscores; rec to continue calcium, MVI, VitD, wt bearing exercise...  PROGRESSIVE MEMORY LOSS >>  ~  MMSE 5/09 by Doran Heater, NP was 29/30... Reassured. ~  3/14: she presented w/ c/o worsening memory & MMSE has diminished- refer to [facility] for eval & rx... ~  Neuro eval 4/14 by DrPenumalli> mild cognitive impairment vs norm aging, he did not rec starting meds... ~  CT Brain 4/14 showed mild atrophy & sm vessel dis ~  She continues to f/u w/ Neurology periodically... ~  1/15: ROV here & husb more concerned re her memory, recall was 0/3 after distraction; husb has apparently done well on Aricept & she wants trial, start ARICEPT5=>10 daily... ~  She has been followed by DrPenumalli & they added Namenda 10Bid to her Aricept10...  ANXIETY (ICD-300.00) - prev on Alpraz Prn but hasn't used in yrs...  DERMATOLOGY >> LIPOMA (ICD-214.9) - notes large lipoma in RLQ/ rt flank area... some discomfort noted... seen by DrLeone in the past w/o surg... she saw DrCornett, CCS, 1/11 for second opinion & she will decide re: excision... ~  5/13:  She had Basal cell ca removed from the left side of her nose, w/ subseq deeper margins & reports everything is OK... ~  7/13:  She is concerned that the lipoma may be growing & she requests f/u DrCornett for excision... ~  10/13:  Lipoma removed by DrCornett in outpt surg 04/17/12...  SHINGLES - remote hx of right T8-9 shingles in 1986...   Past Surgical History:  Procedure Laterality Date  . APPENDECTOMY  1947  . COLONOSCOPY    . MASS EXCISION  04/17/2012   Procedure: EXCISION MASS;  Surgeon:  [name], MD;  Location: [place];  Service: General;  Laterality: N/A;  . THYROIDECTOMY  1990   [name]    Outpatient Encounter Prescriptions as of 02/09/2016  Medication Sig Dispense Refill  . aspirin 81 MG tablet Take 81 mg by mouth daily.    . Calcium Carbonate-Vitamin D (CALCIUM 600+D) 600-400 MG-UNIT per tablet Take 1 tablet by mouth daily.    . Cyanocobalamin (VITAMIN B 12 PO) Take 1 tablet by mouth daily. Reported on 12/27/2015    . donepezil (ARICEPT) 10 MG tablet Take 1 tablet (10 mg total) by mouth at bedtime. 90 tablet 4  . fish oil-omega-3 fatty acids 1000 MG capsule Take 1 g by mouth daily.    [name] glucosamine-chondroitin 500-400 MG tablet Take 1 tablet by mouth 3 (three) times daily.    . hydrochlorothiazide (HYDRODIURIL) 25 MG tablet Take 1 tablet (25 mg total) by mouth daily. 90 tablet 3  . memantine (NAMENDA) 10 MG tablet Take 1 tablet (10 mg total) by mouth 2 (two) times daily. 180 tablet 4  . Multiple Vitamins-Minerals (MULTIVITAMIN & MINERAL PO) Take 1 tablet by mouth daily.    . simvastatin (ZOCOR) 20 MG tablet TAKE 1 TABLET (20 MG TOTAL) BY MOUTH AT BEDTIME. 90 tablet 3  . valsartan (DIOVAN) 160 MG  tablet TAKE 1 TABLET (160 MG TOTAL) BY MOUTH DAILY. 90 tablet 0  . verapamil (CALAN-SR) 240 MG CR tablet TAKE 1 TABLET (240 MG TOTAL)  BY MOUTH AT BEDTIME. 90 tablet 1   No facility-administered encounter medications on file as of 02/09/2016.     Allergies  Allergen Reactions  . Epinephrine Palpitations    REACTION: heart racing    Current Medications, Allergies, Past Medical History, Past Surgical History, Family History, and Social History were reviewed in [employer] record.    Review of Systems        See HPI - all other systems neg except as noted...  The patient complains of dyspnea on exertion.  The patient denies anorexia, fever, weight loss, weight gain, vision loss, decreased hearing, hoarseness, chest pain,  syncope, peripheral edema, prolonged cough, headaches, hemoptysis, abdominal pain, melena, hematochezia, severe indigestion/heartburn, hematuria, incontinence, muscle weakness, suspicious skin lesions, transient blindness, difficulty walking, depression, unusual weight change, abnormal bleeding, enlarged lymph nodes, and angioedema.   Objective:   Physical Exam    WD, WN, 82 y/o WF in NAD... VITAL SIGNS:  Reviewed... GENERAL:  Alert & oriented; pleasant & cooperative... HEENT:  [place]/AT, EOM-wnl, PERRLA, EACs-clear, TMs-wnl, NOSE-clear, THROAT-clear & wnl. NECK:  Supple w/ fair ROM; no JVD; normal carotid impulses w/o bruits; scar of prev thyroid surg- no nodules or goiter; no lymphadenopathy. CHEST:  Clear to P & A; without wheezes/ rales/ or rhonchi. HEART:  Regular Rhythm; without murmurs/ rubs/ or gallops. ABDOMEN:  Soft & nontender; normal bowel sounds; no organomegaly or masses detected. EXT: without deformities, mild arthritic changes; no varicose veins/ venous insuffic/ or edema. prob heel spurs w/ some plantar fascia pain> advised soaks & stretching... NEURO:  CN's intact;  no focal neuro deficits... DERM:  Scar in right flank after lipoma excision... no other lesions noted...  MMSE 09/17/12>  She did not know the Pres ("I can see him, a black guy"), VP, or Gov;  Didn't know prev pres etc;  Recall 3/3 immed, but only 1/3 after distraction;  Serial 7s & WORLD backwards were fluent; proverbs were abstracted normally...  RADIOLOGY DATA:  Reviewed in the EPIC EMR & discussed w/ the patient...  LABORATORY DATA:  Reviewed in the EPIC EMR & discussed w/ the patient...   Assessment & Plan:    Macular Degen>  Followed by DrHecker/ [place] & getting MD shots...  HBP>  on Verap, Diovan, HCT- along w/ no salt; & BP appears to be well controlled & pulse improved (60/min)  DYSLIPIDEMIA>  Stable on diet & Simva20...  Borderline DM>  Hx BS in the 120-130 range yrs ago but stable ~100 for the  last 3+yrs on diet alone...  Hx Hypothyroidism>  She weaned off Synthroid several yrs ago & TSH has remained normal, clinically euthyroid as well...  GI>  Divertics, Colon Polyps>  Stable & up to date w/ f/u colon 4/13 from DrPerry==> divertics, no polyps...  LIPOMA>  Removed 10/13 from RLQ area by DrCornett...  DJD>  She notes some right knee discomfort playing "pickle ball" & is advised to wear knee brace, take Aleve prn (she declines further eval or prescription meds)... 10/16>  1 wk hx right knee & LBP; exam shows good ROM right knee w/ crepitation; XRays indicate degen arthritis; we discussed Rx w/ Tramadol+Tylenol, neoprene sleeve for knee and rest/ heat for back; I indicated next step is to refer to Ortho for shot if symptoms don't respond to Rx.  Osteoporosis>  Hx BMD in 2001 w/ TScore =3.2 in Spine; Rx'd Fosamax for 61yrs w/ steady improvement & on Bisphos drug holiday since 7/11 & BMD is 2015 was WNL- continue supplements w/ Calcium, MVI, Vit D...  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  MEMORY LOSS>  Followed by for mild dementia w/ MMSE 26/30- DrPenumalli on Aricept & Namenda...  Other medical problems as listed...   Patient's Medications  New Prescriptions   No medications on file  Previous Medications   ASPIRIN 81 MG TABLET    Take 81 mg by mouth daily.   CALCIUM CARBONATE-VITAMIN D (CALCIUM 600+D) 600-400 MG-UNIT PER TABLET    Take 1 tablet by mouth daily.   CYANOCOBALAMIN (VITAMIN B 12 PO)    Take 1 tablet by mouth daily. Reported on 12/27/2015   DONEPEZIL (ARICEPT) 10 MG TABLET    Take 1 tablet (10 mg total) by mouth at bedtime.   FISH OIL-OMEGA-3 FATTY ACIDS 1000 MG CAPSULE    Take 1 g by mouth daily.   GLUCOSAMINE-CHONDROITIN 500-400 MG TABLET    Take 1 tablet by mouth 3 (three) times daily.   HYDROCHLOROTHIAZIDE (HYDRODIURIL) 25 MG TABLET    Take 1 tablet (25 mg total)  by mouth daily.   MEMANTINE (NAMENDA) 10 MG TABLET    Take 1 tablet (10 mg total) by mouth 2 (two) times daily.   MULTIPLE VITAMINS-MINERALS (MULTIVITAMIN & MINERAL PO)    Take 1 tablet by mouth daily.   SIMVASTATIN (ZOCOR) 20 MG TABLET    TAKE 1 TABLET (20 MG TOTAL) BY MOUTH AT BEDTIME.   VALSARTAN (DIOVAN) 160 MG TABLET    TAKE 1 TABLET (160 MG TOTAL) BY MOUTH DAILY.   VERAPAMIL (CALAN-SR) 240 MG CR TABLET    TAKE 1 TABLET (240 MG TOTAL)  BY MOUTH AT BEDTIME.  Modified Medications   No medications on file  Discontinued Medications   No medications on file

## 2016-02-14 ENCOUNTER — Telehealth: Payer: Self-pay | Admitting: Pulmonary Disease

## 2016-02-14 NOTE — Telephone Encounter (Signed)
Dr. Dahlia Bailiff office is requesting LOV notes.  Note faxed to Dr. Dahlia Bailiff office. Nothing further needed.

## 2016-03-24 ENCOUNTER — Other Ambulatory Visit: Payer: Self-pay | Admitting: Pulmonary Disease

## 2016-03-27 ENCOUNTER — Other Ambulatory Visit: Payer: Self-pay | Admitting: Pulmonary Disease

## 2016-05-02 ENCOUNTER — Other Ambulatory Visit: Payer: Self-pay | Admitting: Pulmonary Disease

## 2016-05-11 ENCOUNTER — Other Ambulatory Visit: Payer: Self-pay | Admitting: Pulmonary Disease

## 2016-06-20 DIAGNOSIS — H26492 Other secondary cataract, left eye: Secondary | ICD-10-CM | POA: Diagnosis not present

## 2016-06-20 DIAGNOSIS — H353212 Exudative age-related macular degeneration, right eye, with inactive choroidal neovascularization: Secondary | ICD-10-CM | POA: Diagnosis not present

## 2016-06-20 DIAGNOSIS — H35351 Cystoid macular degeneration, right eye: Secondary | ICD-10-CM | POA: Diagnosis not present

## 2016-06-20 DIAGNOSIS — H353133 Nonexudative age-related macular degeneration, bilateral, advanced atrophic without subfoveal involvement: Secondary | ICD-10-CM | POA: Diagnosis not present

## 2016-06-26 DIAGNOSIS — H353123 Nonexudative age-related macular degeneration, left eye, advanced atrophic without subfoveal involvement: Secondary | ICD-10-CM | POA: Diagnosis not present

## 2016-06-26 DIAGNOSIS — H353221 Exudative age-related macular degeneration, left eye, with active choroidal neovascularization: Secondary | ICD-10-CM | POA: Diagnosis not present

## 2016-06-26 DIAGNOSIS — H40033 Anatomical narrow angle, bilateral: Secondary | ICD-10-CM | POA: Diagnosis not present

## 2016-06-26 DIAGNOSIS — Z961 Presence of intraocular lens: Secondary | ICD-10-CM | POA: Diagnosis not present

## 2016-06-29 ENCOUNTER — Emergency Department (HOSPITAL_COMMUNITY): Payer: Medicare Other

## 2016-06-29 ENCOUNTER — Encounter (HOSPITAL_COMMUNITY): Payer: Self-pay

## 2016-06-29 ENCOUNTER — Emergency Department (HOSPITAL_COMMUNITY)
Admission: EM | Admit: 2016-06-29 | Discharge: 2016-06-29 | Disposition: A | Discharge status: Home / Self Care | Payer: Medicare Other | Attending: Emergency Medicine | Admitting: Emergency Medicine

## 2016-06-29 DIAGNOSIS — E039 Hypothyroidism, unspecified: Secondary | ICD-10-CM | POA: Insufficient documentation

## 2016-06-29 DIAGNOSIS — Z87891 Personal history of nicotine dependence: Secondary | ICD-10-CM | POA: Diagnosis not present

## 2016-06-29 DIAGNOSIS — I10 Essential (primary) hypertension: Secondary | ICD-10-CM | POA: Diagnosis not present

## 2016-06-29 DIAGNOSIS — Z7901 Long term (current) use of anticoagulants: Secondary | ICD-10-CM | POA: Insufficient documentation

## 2016-06-29 DIAGNOSIS — Z7982 Long term (current) use of aspirin: Secondary | ICD-10-CM | POA: Diagnosis not present

## 2016-06-29 DIAGNOSIS — I480 Paroxysmal atrial fibrillation: Secondary | ICD-10-CM | POA: Diagnosis not present

## 2016-06-29 DIAGNOSIS — I4891 Unspecified atrial fibrillation: Secondary | ICD-10-CM | POA: Diagnosis not present

## 2016-06-29 DIAGNOSIS — R002 Palpitations: Secondary | ICD-10-CM | POA: Diagnosis present

## 2016-06-29 LAB — BASIC METABOLIC PANEL
ANION GAP: 12 (ref 5–15)
BUN: 17 mg/dL (ref 6–20)
CHLORIDE: 106 mmol/L (ref 101–111)
CO2: 23 mmol/L (ref 22–32)
CREATININE: 0.96 mg/dL (ref 0.44–1.00)
Calcium: 9.8 mg/dL (ref 8.9–10.3)
GFR calc non Af Amer: 54 mL/min — ABNORMAL LOW (ref >60)
Glucose, Bld: 131 mg/dL — ABNORMAL HIGH (ref 65–99)
POTASSIUM: 4.1 mmol/L (ref 3.5–5.1)
SODIUM: 141 mmol/L (ref 135–145)

## 2016-06-29 LAB — TSH: TSH: 0.449 u[IU]/mL (ref 0.350–4.500)

## 2016-06-29 LAB — CBC
HCT: 45.9 % (ref 36.0–46.0)
HEMOGLOBIN: 15.4 g/dL — AB (ref 12.0–15.0)
MCH: 30.9 pg (ref 26.0–34.0)
MCHC: 33.6 g/dL (ref 30.0–36.0)
MCV: 92.2 fL (ref 78.0–100.0)
PLATELETS: 224 10*3/uL (ref 150–400)
RBC: 4.98 MIL/uL (ref 3.87–5.11)
RDW: 13.3 % (ref 11.5–15.5)
WBC: 11.1 10*3/uL — AB (ref 4.0–10.5)

## 2016-06-29 LAB — TROPONIN I

## 2016-06-29 LAB — MAGNESIUM: Magnesium: 2.1 mg/dL (ref 1.7–2.4)

## 2016-06-29 MED ORDER — APIXABAN 2.5 MG PO TABS
2.5000 mg | ORAL_TABLET | Freq: Once | ORAL | Status: AC
  Administered 2016-06-29: 2.5 mg via ORAL
  Filled 2016-06-29: qty 1

## 2016-06-29 MED ORDER — DILTIAZEM HCL 100 MG IV SOLR: 5.0000 mg/h | INTRAVENOUS | Status: DC

## 2016-06-29 MED ORDER — DILTIAZEM LOAD VIA INFUSION
20.0000 mg | Freq: Once | INTRAVENOUS | Status: DC
  Filled 2016-06-29: qty 20

## 2016-06-29 MED ORDER — PROPRANOLOL HCL 10 MG PO TABS: 10.0000 mg | ORAL_TABLET | Freq: Four times a day (QID) | ORAL | 0 refills | Status: DC | PRN

## 2016-06-29 MED ORDER — APIXABAN 2.5 MG PO TABS: 2.5000 mg | ORAL_TABLET | Freq: Two times a day (BID) | ORAL | 0 refills | Status: DC

## 2016-06-29 NOTE — ED Triage Notes (Signed)
Pt reports feeling like her heart was racing since approximately 12. Pt found to be in a-fib. Pt husband reports an episode of n/v/d 2 days ago for approximately 3hrs. Pt denies chest pain.

## 2016-06-29 NOTE — ED Provider Notes (Signed)
Cynthia Mathis Provider Note   CSN: [id number] Arrival date & time: 06/29/16  1622     History   Chief Complaint Chief Complaint  Patient presents with  . Palpitations    HPI Cynthia Mathis is a 82 y.o. female.  Pt presents to the ED today with palpitations.  She said she noticed it around 12:00.  The pt has never been diagnosed with a.fib in the past, but has been having these episodes more frequently.  The pt denies any sob and cp.  She had an EKG in triage that did show a.fib with RVR.  HR 138.  However, pt felt like it went away and now she is in normal sinus on the monitor.  CHADVASC score: 5.      Past Medical History:  Diagnosis Date  . Anxiety   . Borderline diabetes mellitus   . Diverticulosis of colon   . DJD (degenerative joint disease)   . Dyslipidemia   . History of hematuria   . History of osteoporosis   . Hx of colonic polyps   . Hyperlipidemia   . Hypertension   . Hypothyroid   . Lipoma   . Macular degeneration   . Memory loss     Patient Active Problem List   Diagnosis Date Noted  . Influenza with respiratory manifestation 09/21/2015  . Low back pain 05/06/2015  . Syncope 02/08/2015  . Osteopenia 08/04/2013  . Memory loss 09/17/2012  . 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 Mass 04/04/2012  . Macular degeneration (senile) of retina 08/12/2008  . Osteoarthritis 08/12/2008  . Impaired fasting glucose 02/10/2008  . COLONIC POLYPS 10/29/2007  . LIPOMA 10/29/2007  . Diverticulosis of large intestine 10/29/2007  . HEMATURIA, HX OF 10/29/2007  . ANXIETY 09/27/2007  . Hypothyroidism 08/08/2007  . Dyslipidemia 08/08/2007  . Essential hypertension 08/08/2007    Past Surgical History:  Procedure Laterality Date  . APPENDECTOMY  1947  . COLONOSCOPY    . MASS EXCISION  04/17/2012   Procedure: EXCISION MASS;  Surgeon: [name], MD;  Location: [place];  Service: General;  Laterality: N/A;  . THYROIDECTOMY  1990     Cynthia Mathis    OB History    No data available       Home Medications    Prior to Admission medications   Medication Sig Start Date End Date Taking? Authorizing Provider  apixaban (ELIQUIS) 2.5 MG TABS tablet Take 1 tablet (2.5 mg total) by mouth 2 (two) times daily. 06/29/16   Isla Pence, MD  aspirin 81 MG tablet Take 81 mg by mouth daily.    Historical Provider, MD  Calcium Carbonate-Vitamin D (CALCIUM 600+D) 600-400 MG-UNIT per tablet Take 1 tablet by mouth daily.    Historical Provider, MD  Cyanocobalamin (VITAMIN B 12 PO) Take 1 tablet by mouth daily. Reported on 12/27/2015    Historical Provider, MD  donepezil (ARICEPT) 10 MG tablet Take 1 tablet (10 mg total) by mouth at bedtime. 12/27/15   Penni Bombard, MD  fish oil-omega-3 fatty acids 1000 MG capsule Take 1 g by mouth daily.    Historical Provider, MD  glucosamine-chondroitin 500-400 MG tablet Take 1 tablet by mouth 3 (three) times daily.    Historical Provider, MD  hydrochlorothiazide (HYDRODIURIL) 25 MG tablet TAKE 1 TABLET (25 MG TOTAL) BY MOUTH DAILY. 05/03/16   Noralee Space, MD  memantine (NAMENDA) 10 MG tablet Take 1 tablet (10 mg total) by mouth 2 (two) times daily. 12/27/15  Penni Bombard, MD  Multiple Vitamins-Minerals (MULTIVITAMIN & MINERAL PO) Take 1 tablet by mouth daily.    Historical Provider, MD  propranolol (INDERAL) 10 MG tablet Take 1 tablet (10 mg total) by mouth 4 (four) times daily as needed (palpitations). 06/29/16   Isla Pence, MD  simvastatin (ZOCOR) 20 MG tablet TAKE 1 TABLET (20 MG TOTAL) BY MOUTH AT BEDTIME. 03/24/16   Noralee Space, MD  valsartan (DIOVAN) 160 MG tablet TAKE 1 TABLET (160 MG TOTAL) BY MOUTH DAILY. 05/12/16   Noralee Space, MD  verapamil (CALAN-SR) 240 MG CR tablet TAKE 1 TABLET (240 MG TOTAL)  BY MOUTH AT BEDTIME. 03/27/16   Noralee Space, MD    Family History Family History  Problem Relation Age of Onset  . Stroke Mother   . Colon cancer Neg Hx   . Stomach  cancer Neg Hx     Social History Social History  Substance Use Topics  . Smoking status: Former Smoker    Quit date: 07/10/1965  . Smokeless tobacco: Never Used     Comment: 3 cigs per week x 3 years (started in 1964), 12/27/15 does not smoke  . Alcohol use 0.0 oz/week     Comment: occasionally     Allergies   Epinephrine   Review of Systems Review of Systems  Cardiovascular: Positive for palpitations.  All other systems reviewed and are negative.    Physical Exam Updated Vital Signs BP (!) 144/106 (BP Location: Left Arm)   Pulse (!) 138   Temp 98 F (36.7 C) (Oral)   Resp 14   SpO2 97%   Physical Exam  Constitutional: She is oriented to person, place, and time. She appears well-developed and well-nourished.  HENT:  Head: Normocephalic and atraumatic.  Right Ear: External ear normal.  Left Ear: External ear normal.  Nose: Nose normal.  Mouth/Throat: Oropharynx is clear and moist.  Eyes: Conjunctivae and EOM are normal. Pupils are equal, round, and reactive to light.  Neck: Normal range of motion. Neck supple.  Cardiovascular: Normal rate, regular rhythm, normal heart sounds and intact distal pulses.   Pulmonary/Chest: Effort normal and breath sounds normal.  Abdominal: Soft. Bowel sounds are normal.  Musculoskeletal: Normal range of motion.  Neurological: She is alert and oriented to person, place, and time.  Skin: Skin is warm. Capillary refill takes less than 2 seconds.  Psychiatric: She has a normal mood and affect. Her behavior is normal. Judgment and thought content normal.  Nursing note and vitals reviewed.    ED Treatments / Results  Labs (all labs ordered are listed, but only abnormal results are displayed) Labs Reviewed  BASIC METABOLIC PANEL - Abnormal; Notable for the following:       Result Value   Glucose, Bld 131 (*)    GFR calc non Af Amer 54 (*)    All other components within normal limits  CBC - Abnormal; Notable for the following:     WBC 11.1 (*)    Hemoglobin 15.4 (*)    All other components within normal limits  MAGNESIUM  TSH  TROPONIN I    EKG  EKG Interpretation  Date/Time:  Thursday June 29 2016 17:08:15 EST Ventricular Rate:  67 PR Interval:    QRS Duration: 78 QT Interval:  424 QTC Calculation: 448 R Axis:   41 Text Interpretation:  Sinus rhythm Low voltage, precordial leads no meds needed for conversion Confirmed by [facility] MD, Cardarius Senat ([id number]) on 06/29/2016 5:13:42 PM  Radiology Dg Chest 2 View  Result Date: 06/29/2016 CLINICAL DATA:  Atrial fibrillation. EXAM: CHEST  2 VIEW COMPARISON:  09/21/2015 FINDINGS: Normal heart size. Stable mediastinal contours accounting for rightward rotation There is no edema, consolidation, effusion, or pneumothorax. Artifact from EKG leads. Exaggerated thoracic kyphosis and spondylosis. IMPRESSION: No evidence of acute disease.  Stable from prior. Electronically Signed   By: Monte Fantasia M.D.   On: 06/29/2016 17:33    Procedures Procedures (including critical care time)  Medications Ordered in ED Medications  apixaban (ELIQUIS) tablet 2.5 mg (not administered)     Initial Impression / Assessment and Plan / ED Course  I have reviewed the triage vital signs and the nursing notes.  Pertinent labs & imaging results that were available during my care of the patient were reviewed by me and considered in my medical decision making (see chart for details).  Clinical Course    Pt converted spontaneously.  She is feeling much better.  She was d/w Dr. Cathie Olden (cardiology) who recommended starting pt on low dose Eliquis and propranolol prn.  The pt is agreeable to this plan.  Pt knows to call tomorrow for an appt.  Final Clinical Impressions(s) / ED Diagnoses   Final diagnoses:  Paroxysmal a-fib (HCC)    New Prescriptions New Prescriptions   APIXABAN (ELIQUIS) 2.5 MG TABS TABLET    Take 1 tablet (2.5 mg total) by mouth 2 (two) times daily.    PROPRANOLOL (INDERAL) 10 MG TABLET    Take 1 tablet (10 mg total) by mouth 4 (four) times daily as needed (palpitations).     Isla Pence, MD 06/29/16 [phone]

## 2016-06-30 ENCOUNTER — Telehealth (HOSPITAL_COMMUNITY): Payer: Self-pay | Admitting: *Deleted

## 2016-06-30 NOTE — Telephone Encounter (Signed)
lmom for pt to either call Dr. Lanny Hurst office or our office for a follow up for afib.

## 2016-07-04 ENCOUNTER — Telehealth: Payer: Self-pay | Admitting: Pulmonary Disease

## 2016-07-04 NOTE — Telephone Encounter (Signed)
Called and spoke with pt and she is aware that SN has no openings until 1-16---offered appt sooner with TP and pt accepted this appt---for 1-5 with TP.  Nothing further is needed.

## 2016-07-04 NOTE — Telephone Encounter (Signed)
Called and spoke with pt and she stated that she was seen in the ER 12/21 and was told to follow up with her PCP in the next few days.  Pt is aware that SN next avail is not until 1/16.  SN please advise if you want to see the pt prior to this?  Thanks  Allergies  Allergen Reactions  . Epinephrine Palpitations    REACTION: heart racing

## 2016-07-07 ENCOUNTER — Encounter: Payer: Self-pay | Admitting: Pulmonary Disease

## 2016-07-14 ENCOUNTER — Ambulatory Visit (INDEPENDENT_AMBULATORY_CARE_PROVIDER_SITE_OTHER): Payer: Medicare Other | Admitting: Adult Health

## 2016-07-14 ENCOUNTER — Encounter: Payer: Self-pay | Admitting: Adult Health

## 2016-07-14 VITALS — BP 136/74 | HR 58 | Ht 62.0 in | Wt 141.0 lb

## 2016-07-14 DIAGNOSIS — I10 Essential (primary) hypertension: Secondary | ICD-10-CM

## 2016-07-14 DIAGNOSIS — I4891 Unspecified atrial fibrillation: Secondary | ICD-10-CM | POA: Diagnosis not present

## 2016-07-14 DIAGNOSIS — I480 Paroxysmal atrial fibrillation: Secondary | ICD-10-CM

## 2016-07-14 NOTE — Assessment & Plan Note (Addendum)
PAF w/ spontaneous conversion not requiring cardioversion or medication . She remains in SR .  Cardiology ov has not been made., will refer today  She can continue on Eliquis  Pt education on NOAC   Plan  Patient Instructions  Continue on Eliquis 2.5mg  Twice daily   Do not take any NSAIDS -advil, ibuprofen, aleve, etc  Refer to Cardiology .  Follow up with [name]  In 4 weeks and As needed   Please contact office for sooner follow up if symptoms do not improve or worsen or seek emergency care

## 2016-07-14 NOTE — Assessment & Plan Note (Signed)
Well controlled 

## 2016-07-14 NOTE — Progress Notes (Signed)
@Patient  ID: Cynthia Mathis, female    DOB: August 20, 1932, 83 y.o.   MRN: [id number]  Chief Complaint  Patient presents with  . Hospitalization Follow-up     A fib     Referring provider: Noralee Space, MD  HPI: 83 year old female, [name]  Primary care patient followed for hypertension, hyperlipidemia, hypothyroidism, mild cognitive impairment, and DJD.  07/14/2016 ER follow up  Patient returns for an emergency room follow-up. Patient presented to the emergency room on December 21 with palpitations. She was noted to be in  A Fib w/ RVR  with a heart rate at her 138. She spontaneously converted to normal sinus rhythm during her emergency room visit without medication. Patient was recommended to follow-up with cardiology and was started on Eliquis. Patient says she's been doing well since discharge. She's had no further palpitations. Denies any known bleeding. Emergency room notes were reviewed and labs showed a normal HCT. And TSH. EKG today shows NSR-Sinus Bradycardia with HR at 54.  He denies any chest pain, palpitations, orthopnea, PND, syncope. She denies sudafed use or excessive caffeine use.    Allergies  Allergen Reactions  . Epinephrine Palpitations    REACTION: heart racing    Immunization History  Administered Date(s) Administered  . DTP 04/17/2000  . Influenza Split 05/20/2011, 05/15/2012  . Influenza Whole 05/02/2010  . Influenza, High Dose Seasonal PF 05/08/2013  . Influenza,inj,Quad PF,36+ Mos 05/25/2014, 06/29/2015, 06/13/2016  . Pneumococcal Polysaccharide-23 04/17/2000  . Tdap 08/04/2013    Past Medical History:  Diagnosis Date  . Anxiety   . Borderline diabetes mellitus   . Diverticulosis of colon   . DJD (degenerative joint disease)   . Dyslipidemia   . History of hematuria   . History of osteoporosis   . Hx of colonic polyps   . Hyperlipidemia   . Hypertension   . Hypothyroid   . Lipoma   . Macular degeneration   . Memory loss     Tobacco  History: History  Smoking Status  . Former Smoker  . Quit date: 07/10/1965  Smokeless Tobacco  . Never Used    Comment: 3 cigs per week x 3 years (started in 1964), 12/27/15 does not smoke   Counseling given: Not Answered   Outpatient Encounter Prescriptions as of 07/14/2016  Medication Sig  . apixaban (ELIQUIS) 2.5 MG TABS tablet Take 1 tablet (2.5 mg total) by mouth 2 (two) times daily.  [name] aspirin 81 MG tablet Take 81 mg by mouth daily.  . Calcium Carbonate-Vitamin D (CALCIUM 600+D) 600-400 MG-UNIT per tablet Take 1 tablet by mouth daily.  . Cyanocobalamin (VITAMIN B 12 PO) Take 1 tablet by mouth daily. Reported on 12/27/2015  . donepezil (ARICEPT) 10 MG tablet Take 1 tablet (10 mg total) by mouth at bedtime.  . fish oil-omega-3 fatty acids 1000 MG capsule Take 1 g by mouth daily.  [name] glucosamine-chondroitin 500-400 MG tablet Take 1 tablet by mouth 3 (three) times daily.  . hydrochlorothiazide (HYDRODIURIL) 25 MG tablet TAKE 1 TABLET (25 MG TOTAL) BY MOUTH DAILY.  . memantine (NAMENDA) 10 MG tablet Take 1 tablet (10 mg total) by mouth 2 (two) times daily.  . Multiple Vitamins-Minerals (MULTIVITAMIN & MINERAL PO) Take 1 tablet by mouth daily.  . propranolol (INDERAL) 10 MG tablet Take 1 tablet (10 mg total) by mouth 4 (four) times daily as needed (palpitations).  . simvastatin (ZOCOR) 20 MG tablet TAKE 1 TABLET (20 MG TOTAL) BY MOUTH AT BEDTIME.  [name]  valsartan (DIOVAN) 160 MG tablet TAKE 1 TABLET (160 MG TOTAL) BY MOUTH DAILY.  . verapamil (CALAN-SR) 240 MG CR tablet TAKE 1 TABLET (240 MG TOTAL)  BY MOUTH AT BEDTIME.   No facility-administered encounter medications on file as of 07/14/2016.      Review of Systems  Constitutional:   No  weight loss, night sweats,  Fevers, chills,  +fatigue, or  lassitude.  HEENT:   No headaches,  Difficulty swallowing,  Tooth/dental problems, or  Sore throat,                No sneezing, itching, ear ache, nasal congestion, post nasal drip,   CV:  No  chest pain,  Orthopnea, PND, swelling in lower extremities, anasarca, dizziness, palpitations, syncope.   GI  No heartburn, indigestion, abdominal pain, nausea, vomiting, diarrhea, change in bowel habits, loss of appetite, bloody stools.   Resp: No shortness of breath with exertion or at rest.  No excess mucus, no productive cough,  No non-productive cough,  No coughing up of blood.  No change in color of mucus.  No wheezing.  No chest wall deformity  Skin: no rash or lesions.  GU: no dysuria, change in color of urine, no urgency or frequency.  No flank pain, no hematuria   MS:  No joint pain or swelling.  No decreased range of motion.  No back pain.    Physical Exam  BP 136/74 (BP Location: Left Arm, Cuff Size: Normal)   Pulse (!) 58   Ht 5\' 2"  (1.575 m)   Wt 141 lb (64 kg)   SpO2 97%   BMI 25.79 kg/m   GEN: A/Ox3; pleasant , NAD, elderly    HEENT:  [place]/AT,  EACs-clear, TMs-wnl, NOSE-clear, THROAT-clear, no lesions, no postnasal drip or exudate noted.   NECK:  Supple w/ fair ROM; no JVD; normal carotid impulses w/o bruits; no thyromegaly or nodules palpated; no lymphadenopathy.    RESP  Clear  P & A; w/o, wheezes/ rales/ or rhonchi. no accessory muscle use, no dullness to percussion  CARD:  RRR, no m/r/g, no peripheral edema, pulses intact, no cyanosis or clubbing.  GI:   Soft & nt; nml bowel sounds; no organomegaly or masses detected.   Musco: Warm bil, no deformities or joint swelling noted.   Neuro: alert, no focal deficits noted.    Skin: Warm, no lesions or rashes  Psych:  No change in mood or affect. No depression or anxiety.  No memory loss.  Lab Results:  CBC    Component Value Date/Time   WBC 11.1 (H) 06/29/2016 1638   RBC 4.98 06/29/2016 1638   HGB 15.4 (H) 06/29/2016 1638   HCT 45.9 06/29/2016 1638   PLT 224 06/29/2016 1638   MCV 92.2 06/29/2016 1638   MCH 30.9 06/29/2016 1638   MCHC 33.6 06/29/2016 1638   RDW 13.3 06/29/2016 1638   LYMPHSABS 3.2  08/12/2015 0847   MONOABS 0.9 08/12/2015 0847   EOSABS 0.2 08/12/2015 0847   BASOSABS 0.1 08/12/2015 0847    BMET    Component Value Date/Time   NA 141 06/29/2016 1638   K 4.1 06/29/2016 1638   CL 106 06/29/2016 1638   CO2 23 06/29/2016 1638   GLUCOSE 131 (H) 06/29/2016 1638   BUN 17 06/29/2016 1638   CREATININE 0.96 06/29/2016 1638   CALCIUM 9.8 06/29/2016 1638   GFRNONAA 54 (L) 06/29/2016 1638   GFRAA >60 06/29/2016 1638    BNP No results found  for: BNP  ProBNP No results found for: PROBNP  Imaging: Dg Chest 2 View  Result Date: 06/29/2016 CLINICAL DATA:  Atrial fibrillation. EXAM: CHEST  2 VIEW COMPARISON:  09/21/2015 FINDINGS: Normal heart size. Stable mediastinal contours accounting for rightward rotation There is no edema, consolidation, effusion, or pneumothorax. Artifact from EKG leads. Exaggerated thoracic kyphosis and spondylosis. IMPRESSION: No evidence of acute disease.  Stable from prior. Electronically Signed   By: Monte Fantasia M.D.   On: 06/29/2016 17:33     Assessment & Plan:   Atrial fibrillation ([place]) PAF w/ spontaneous conversion not requiring cardioversion or medication . She remains in SR .  Cardiology ov has not been made., will refer today  She can continue on Eliquis  Pt education on NOAC   Plan  Patient Instructions  Continue on Eliquis 2.5mg  Twice daily   Do not take any NSAIDS -advil, ibuprofen, aleve, etc  Refer to Cardiology .  Follow up with [name]  In 4 weeks and As needed   Please contact office for sooner follow up if symptoms do not improve or worsen or seek emergency care         Rexene Edison, NP 07/14/2016

## 2016-07-14 NOTE — Patient Instructions (Addendum)
Continue on Eliquis 2.5mg  Twice daily   Do not take any NSAIDS -advil, ibuprofen, aleve, etc  Refer to Cardiology .  Follow up with [name]  In 4 weeks and As needed   Please contact office for sooner follow up if symptoms do not improve or worsen or seek emergency care

## 2016-07-19 ENCOUNTER — Other Ambulatory Visit: Payer: Self-pay | Admitting: Pulmonary Disease

## 2016-08-14 ENCOUNTER — Encounter: Payer: Self-pay | Admitting: Pulmonary Disease

## 2016-08-14 ENCOUNTER — Ambulatory Visit (INDEPENDENT_AMBULATORY_CARE_PROVIDER_SITE_OTHER): Payer: Medicare Other | Admitting: Pulmonary Disease

## 2016-08-14 VITALS — BP 120/72 | HR 58 | Temp 97.1°F | Ht 62.0 in

## 2016-08-14 DIAGNOSIS — I10 Essential (primary) hypertension: Secondary | ICD-10-CM | POA: Diagnosis not present

## 2016-08-14 DIAGNOSIS — I480 Paroxysmal atrial fibrillation: Secondary | ICD-10-CM

## 2016-08-14 DIAGNOSIS — H3530 Unspecified macular degeneration: Secondary | ICD-10-CM

## 2016-08-14 DIAGNOSIS — M150 Primary generalized (osteo)arthritis: Secondary | ICD-10-CM

## 2016-08-14 DIAGNOSIS — M159 Polyosteoarthritis, unspecified: Secondary | ICD-10-CM

## 2016-08-14 DIAGNOSIS — E785 Hyperlipidemia, unspecified: Secondary | ICD-10-CM | POA: Diagnosis not present

## 2016-08-14 DIAGNOSIS — E039 Hypothyroidism, unspecified: Secondary | ICD-10-CM | POA: Diagnosis not present

## 2016-08-14 DIAGNOSIS — R413 Other amnesia: Secondary | ICD-10-CM

## 2016-08-14 DIAGNOSIS — R7301 Impaired fasting glucose: Secondary | ICD-10-CM | POA: Diagnosis not present

## 2016-08-14 MED ORDER — APIXABAN 2.5 MG PO TABS: 2.5000 mg | ORAL_TABLET | Freq: Two times a day (BID) | ORAL | 11 refills | Status: DC

## 2016-08-14 NOTE — Progress Notes (Signed)
Subjective:    Patient ID: Cynthia Mathis, female    DOB: 12-Jul-1932, 83 y.o.   MRN: [id number]  HPI 83 y/o WF here for a follow up visit... she has mult med problems as noted below ...  ~  SEE PREV EPIC NOTES FOR OLDER DATA >>   ~  sees DrPerry for GI- divertics, polyps, hems & a bout of diverticulitis in Mar08...  ~  sees Dr Risa Grill Urology w/ microscopic hematuria and a neg eval 5/08... 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 ~  sees DrLLomax for Derm w/ alopecia- offered Rogaine but decided against it... ~  Sees DrPenumali for Neuro w/ memory loss, early stage dementia...   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  September 17, 2012:  concerned about her memory; husb has been under the care of DrLove for >59yr w/ mild cognitive impairment  LABS 3/14:  Chems- wnl w/ BS=106, A1c=6.2;  TSH=0.37;  Sed=14...    January 23, 2013:  visit w/ DrPenumalli 3/14> her MMSE was 29/30 & he planned to check MRI Brain & B12 level, he did not rec starting meds yet (husb takes Razadyne & feels it helps him).  ADDENDUM 04/15/13 >> pt called & wants off Simva20 after reading something about memory loss=> change to Atorva20 (she never did).  CXR 1/15 showed heart at upper lim of norm, lungs clear (mild dev trachea to right c/w enlarged left lobe of thyroid- no change).  EKG 1/15 showed SBrady, rate44, NSSTTW changes.  LABS 1/15:  FLP- at goals on Simva20;  Chems- wnl;  CBC- wnl;  TSH=0.59;  VitD=54.  BMD 08/22/13>  Lowest Tscore= -0.9 in APspine... She is off bisphos therapy for several yrs now & remains improved...   NOTE:  She wants to try ARICEPT10 start 1/2 => 1 tab daily as trial; Pulse is low at 48 & we decided to stop ToprolXL & replace it w/ VALSARTAN160/d.  LABS, Mammogram, CXR, BMD, EKG> all reviewed ...  ~  [id number]  it took quite a bit of time getting her med  list straight- she did not know her meds, her card w/ meds written on it was old & out of date, Epic meds were not up to date either & we called Pharm- pt is reminded to bring all med bottles to every office visit..Marland KitchenMarland Kitchenhe has mult minor somatic complaints...  LABS 2/16:  FLP- at goals on Simva20;  Chems- wnl;  CBC- wnl;  TSH=0.76... ~  [id number]  [name] has mult somatic complaints> runny nose (offered Zyrtek & Astelin but she doesn't want more meds), cold feet, hot & sweaty, thirsty, right knee pain, stepped on a hornets nest w/ 12 stings and reaction; she also reported an episode of syncope [place] she was taken to daughter's doctor & reports that everything checked out OK- we do not have the data... She remains very active & plays "pickle ball" everyday.  CT Abd 10/01/14>  Severe diverticulosis & mild acute sigm diverticulitis + inflammed distal rectum/anus c/w acute proctitis; 119msolitary cystic lesion in body of pancreas, gallstones and atherosclerotic changes in Ao...   ~  May 06, 2015:  69m21moV & add-on appt requested by pt due to right knee pain and LBP for the last 1wk she says; no known trauma or injury, and she denies prev problems x occas soreness; she thinks that she  may have had a shot in her knee once several yrs ago; as for the back pain- it is dull, in center of back w/o radiation...     EXAM shows Afeb, VSS, O2sat=97%;  HEENT- neg, mallampati1;  Chest- clear w/o w/r/r;  Heart- RR w/o m/r/g;  Abd- soft, nontender, neg;  Ext- +crepitus in right knee, w/o c/c/e;  Neuro- intact...   XRay right knee 05/06/15> osteoarthtitis w/ medial joint space compartment narrowing with milder patellofemoral joint space compartment narrowing. Marginal osteophytes project from all 3 compartments, mild spurring along the tibial spine, no joint effusion; Bones are demineralized...  XRay LS spine 05/06/15>  Disc space narrowing at L2-3 with endplate sclerosis. Mild osteophytic  changes are seen. Diffuse aortic calcifications are noted. NAD... IMP/PLAN>>  [name] is c/o 1 wk hx right knee & LBP; exam shows good ROM right knee w/ crepitation; XRays indicate degen arthritis; we discussed Rx w/ Tramadol+Tylenol, neoprene sleeve for knee and rest/ heat for back; I indicated next step is to refer to Ortho for shot if symptoms don't respond to Rx...   ~  August 11, 2015:  3moROV & Deyonna is stable- c/o runny nose, LBP, shoulder pain, right knee pain; she has tried Tramadol w/o much benefit & we discussed adding topical heat, ParafonTid 7 refer to Ortho for further eval... We reviewed the following medical problems during today's office visit >>     Macular Degen> followed by DrHecker & DrRankin; she received shots MD & had bilat cat surg but we do not have notes from them...    Hearing Loss> she saw DrGore, ENT w/ audiology showing bilat SNHL...    HBP> on ASA81, Valsartan160, Verap240, HCT25; BP= 142/68 & she denies HA, CP, palpit, dizzy, SOB, edema, etc...    Hyperlipid> on Simva20; FLP 2/17 show TChol 155, TG 71, HDL 60, LDL 80; continue same meds...    Hx Impaired FBS> Hx BS in the 130 range prev, but all readings ~100 x yrs now;  Labs 2/17 showed BS=94    Hypothy> hx multinod thyroid x yrs; had thyroidectomy 1990 by DrWeatherly; she weaned off Thyroid hormone yrs ago & has remained clinically & biochem euthyroid; Labs 2/17 showed TSH= 0.68    GI- Divertics, Polyps> hx diverticulitis in 2008- followed by DrPerry; last colonoscopy 5/16 showed severe diverticulosis w/o inflamm or polyps seen...    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 Labs 2/17 showed Hg= 14.6 & prev LFTs are WNL.    DJD, Osteoporosis> on Ca, MVI, Glucosamine; she uses OTC analgesic meds as needed; see BMDs below- on Bisphos drug holiday since 7/11 & due for f/u BMD... Labs 2/17 w/ VitD= 44    Memory Loss> eval by  [facility] 4/14, DrPenumalli; felt to have mild cognitive impairment vs normal aging, then w/ progressive impairment & they have her on Donepezil10 & Namenda10Bid now... EXAM shows Afeb, VSS, O2sat=98%;  HEENT- neg, mallampati1;  Chest- clear w/o w/r/r;  Heart- RR w/o m/r/g;  Abd- soft, nontender, neg;  Ext- +crepitus in right knee, w/o c/c/e;  Neuro- intact...   LABS 08/11/15>  FLP- all parameters at goals on Simva20;  Chems- wnl;  CBC- wnl;  TSH=0.68;  VitD=44...  IMP/PLAN>>  [name] is stable- we discussed trying heat, Tramadol, Parafon for her arthritic & LBP complaints; we will refer to Ortho; rec routine ROV in 639mo.   ~  February 09, 2016:  9m71mo  ROV & [name] is stable overall but persists w/ mult somatic complaints like her runny nose (Astelin rx- offered alternative but she does not want new meds); she still has LBP, shoulder pain, knee pain- on Parafon, Tramadol, & heat but she did not go to Ortho for further eval as rec last visit; she is also c/o difficulty hearing & has hearing aides [facility] but says it isn't right- offered 2nd opinion from ENT & she will let me know;  She also c/o noct leg cramps- we reviewed the usual recs for tonic water vs yellow mustard;  Finally she is also c/o visual difficulties- she had cat surg by DrHecker, and is followed by retina specialist DrRankin w/ macular degen s/p shots...    Macular Degen> followed by DrHecker & DrRankin; she received shots for MD & had bilat cat surg but we do not have notes from them...    Hearing Loss> she saw DrGore, ENT w/ audiology showing bilat SNHL, she has bilat hearing aides but notes that they are not right...    HBP> on ASA81, Valsartan160, Verap240, HCT25; BP= 132/70 & she denies HA, CP, palpit, dizzy, SOB, edema, etc...    Hyperlipid> on Simva20; FLP 2/17 show TChol 155, TG 71, HDL 60, LDL 80; continue same meds...    Hx Impaired FBS> Hx BS in the 130 range prev, but all readings ~100 x yrs now on diet alone;  Labs 2/17  showed BS=94    Hypothy> hx multinod thyroid x yrs; had thyroidectomy 1990 by DrWeatherly; she weaned off Thyroid hormone yrs ago & has remained clinically & biochem euthyroid; Labs 2/17 showed TSH= 0.68    GI- Divertics, Polyps> hx diverticulitis in 2008- followed by DrPerry; last colonoscopy 5/16 showed severe diverticulosis w/o inflamm or polyps seen, advised Miralax etc...    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 Labs 2/17 showed Hg= 14.6 & prev LFTs are WNL.    GYN- DrRichardson    DJD, Osteoporosis> on Ca, MVI, Glucosamine; she uses OTC analgesic meds as needed; see BMDs below- on Bisphos drug holiday since 7/11 & due for f/u BMD... Labs 2/17 w/ VitD= 44    Memory Loss> eval by [facility] 4/14, DrPenumalli & recent f/u 12/2015 (MMSE=26) felt to have mild cognitive impairment vs normal aging, then w/ progressive impairment & they have her on Donepezil10 & Namenda10Bid.  EXAM shows Afeb, VSS, O2sat=98%;  HEENT- neg, mallampati1;  Chest- clear w/o w/r/r;  Heart- RR w/o m/r/g;  Abd- soft, nontender, neg;  Ext- +crepitus in right knee, w/o c/c/e;  Neuro- early dementia, no focal neuro changes...  IMP/PLAN>>  Maija has mult somatic complaints and we discussed each (again) & I wonder about relation to her dementia; she will continue current meds and we reviewed tonic water vs yellow mustard for the noct leg cramps & wrote this down on her AVS; we plan ROV in 1mow/ f/u CXR, EKG, Fasting labs... Note: >50% of this 277m visit was spent in counseling & coordination of care...   ~  August 14, 2016:  49m47moV & Mrs RetBerdine Addisons seen in the ER 06/29/16 with palpitations> EKG showed Afib w/ rvr 138, then resolved spont in ER to NSR, Exam was otherw unremarkable, CHADsVASC score=5; CXR- NAD and LABS- ok... She was placed on Eliquis2.5Bid & Propranolol 9m7mn palpit & asked to f/u w/ CARDS;  She had f/u appt here w/  PA- Tammy Parrett on 07/14/16> pt reported doing well- no recurrent palpit, no CP/ SOB/ etc, doing well on Eliquis w/o bleeding/ bruising/ etc; f/u EKG showed holding SBrady, rate 54 => she has a CARDS appt w/ DrNelson 08/24/16...    Her CC= constant right knee pain => Rx w/ OTC Advil/ Aleve / Tylenol prn & we will refer to Ortho for further eval... EXAM shows Afeb, VSS, O2sat=98%;  HEENT- neg, mallampati1;  Chest- clear w/o w/r/r;  Heart- RR w/o m/r/g;  Abd- soft, nontender, neg;  Ext- +crepitus in right knee, w/o c/c/e;  Neuro- early dementia, no focal neuro changes...   CXR 06/29/16>  Norm heart size, clear lungs- NAD, thoracic kyphosis & spondylosis...   EKG 06/29/16>  AFlutter w/ variable conduction, HR=137;  Converted spont to NSR rate 67, EKG otherw wnl...  LABS 06/2016 in Epic>  CBC/ Chems- reviewed & ok;  TSH=0.45 IMP/PLAN>>  [name] had episode of AFlutter w/ rvr & converted spont to NSR, started on eliquis2.5Bid for ChadsVasc scaore of 5; she has yet to see cards but appt w/ drNelson set for 08/24/16... Her CC is right knee pain & we will send to Ortho for assessmant...           Problem List:  MACULAR DEGENERATION (ICD-362.50) - eval by DrHecker w/ foveal telangiectasia, macular drusen, macular degeneration, and cataracts- being followed regularly... vision is preserved so far... ~  11/11:  f/u DrHecker- stable age related mac degen, no retinopathy. ~  11/12:  f/u DrHecker- has advanced bilat AMD, sent to [facility], we don't have his notes... ~  She has been getting shots in the eyes for macular degen, we do not have notes from [facility]... ~  2/16: she tells me that she continues to receive the shots; she is also sched for bilat cat surg by DrHecker...  HYPERTENSION (ICD-401.9) >>  ~  on VERAPAMIL 222m/d,  TOPROL-XL 241md,  HCTZ 2525m... Prev on Lisinopril but this was stopped due to throat clearing tic & mild cough, plus K=6.0 at the time (all improved off the ACE)... ~  8/10:  borderline BP here and she will monitor at home carefully & call if not <150/90. ~  7/11: she reports that she incr the Lisinopril to 1&1/2 tabs due to BP~150+, now better... ~  7/12:  BP stable on Verap240 & Lisinopril 25m25m BP= 140/68 today> denies HA, fatigue, visual changes, CP, palipit, dizziness, syncope, dyspnea, edema, etc... ~  1/13:  BP= 160/90 (didn't take med today); states BP are ok at home; continue same meds + low sodium etc... ~  CXR 1/13 showed norm heart size, clear lungs, mild thor spondylosis, NAD... ~[name] 7/13:  BP= 152/82 & she notes some troublesome throat clearing & mild cough; we decided to STOP the ACE & switch to LOSARTAN 100mg71mmonitor her symptoms and her blood presure... ~  CXR 9/13 showed calcif Ao, clear lungs, suspect left thyroid enlargement, DJD in Tspine... ~  EKG 10/13 showed SBrady, rate58, wnl, NAD... ~  11/13: she had long convoluted hx of BP med changes- see above- now on Verap240, MetopER25, HCT25, & BP= 126/72; we discussed options but she is content to continue these same meds for now & watch BP... ~  3/14: on ASA81, Metop25, Verap240, HCT25; BP= 140/80 & she denies HA, CP, palpit, dizzy, SOB, edema, etc. ~  1/15: on ASA81, Metop25, Verap240, HCT25; BP= 140/82 & she remains essen asymptomatic; she is bradycardic & we decided to stop ToprolXL & replace  it w/ [id number] ~  CXR 1/15 showed heart at upper lim of norm, mild dev of trach to right, clear lungs, NAD...  ~  EKG 1/15 showed SBrady, rate44, NSSTTWA, NAD.[name]. ~  7/15: on ASA81, Diovan160, Verap240, HCT25; BP= 120/60 & she is essentially asymptomatic, pulse=60, continue same meds... ~  2/16: on same meds & BP= 128/76, she remains asymptomatic...  DYSLIPIDEMIA (ICD-272.4) - on ZOCOR 41m/d... plus doing well on diet... ~  [place] showed TChol 144, TG 140, HDL 39, LDL 77... continue same. ~  FLP 2/10 showed TChol 164, TG 77, HDL 54, LDL 95 ~  FLP 8/10 showed TChol 138, TG 51, HDL 48, LDL 80 ~  FLP 1/11  showed TChol 155, TG 100, HDL 48, LDL 87 ~  FLP 7/11 showed TChol 158, TG 74, HDL 55, LDL 89 ~  FLP 1/12 showed TChol 155, TG 101, HDL 50, LDL 85 ~  FLP 1/13 on Simva20 showed TChol 169, TG 118, HDL 56, LDL 89  ~  FLP 1/15 on Simva20 showed TChol 155, TG 122, HDL 50, LDL 81 ~  FLP 2/16 on Simva20 showed TChol 161, TG 107, HDL 61, LDL 79; continue same meds.  Hx Impaired Fasting Glucose >> on diet alone... FBS @ home betw 120-130 recently... she is on a good diet and weight is down at 139# today (63"  BMI= 24)... ~  labs 3/09 showed FBS= 119... subseq HgA1c 5/09 was 6.4... diet Rx. ~  labs 8/09 showed BS= 106, HgA1c= 5.9..Marland KitchenMarland Kitchenrec- same. ~  labs 2/10 showed BS= 102, A1c= 5.9 ~  labs 1/11 showed BS= 98 ~  labs 1/12 showed BS= 95 ~  Labs 1/13 showed BS= 101 ~  Labs 3/14 showed BS=106, A1c=6.2 ~  Labs 1/15 showed BS= 103 ~  Labs 2/16 showed FBS= 99  HYPOTHYROIDISM (ICD-244.9) - off Synthroid now...  clinically & biochemically euthyroid... remote hx of overactive thyroid many yrs ago in [place](we don't [place] records)... she had an eval by DrKohut in 1987 w/ familial multinodular goiter w/ neg FNA... started on Rx but goiter enlarged- had thyroidectomy 1990 by DrWeatherly... ~  labs 3/09 showed TSH = 0.35 (0.35-5.5)... we have slowly decr her dose to 5747m/d... ~  labs 8/09 showed TSH= 0.33... rec- decr the Synth50 to 1/2 tab daily. ~  labs 2/10 showed =TSH= 0.54 and Synthroid stopped... ~  labs 4/10 off med showed TSH= 0.59 ~  labs 8/10 showed TSH= 0.60 ~  labs 1/11 showed TSH= 0.60 ~  labs 7/11 showed TSH= 0.56 ~  labs 1/12 showed TSH= 0.60 ~  Labs 1/13 showed TSH= 0.58 ~  Labs 3/14 showed TSH= 0.37 ~  Labs 1/15 showed TSH= 0.59 ~  Labs 2/16 showed TSH= 0.76  DIVERTICULOSIS OF COLON (ICD-562.10) COLONIC POLYPS (ICD-211.3) - followed by DrPerry... bout of diverticulitis in Mar08... ~  last colonoscopy 3/08 showed divertics, 47m14molyp, hems... path=hyperplastic, f/u planned 26yr41yr  F/u  colonoscopy 4/13 by DrPerry showed severe diverticulosis w/o inflamm and w/o polyps etc...  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  HEMATURIA, HX OF (ICD-V13.09) - eval 4/08 by DrGrapey w/ neg Sonar, Cysto, cytology...  GY[facility]Richardson[facility])...  DEGENERATIVE JOINT DISEASE (ICD-715.90) - c/o discomfort in her shoulders and knees... uses OTC  meds as needed. ~  04/2015:  She presented w/ 1wk hx right knee & LBP; exam showed crepitation in right knee but good ROM & SLR was neg; Rec to use Tramadol+Tylenol, neoprene support sleeve for knee & rest/heat/ exercises for back; we will refer to Ortho for shots if not responding...   Hx of OSTEOPOROSIS (ICD-733.00) - on Fosamax 70/wk thru 7/11, Caltrate 1/d, MVI, Vit D 1000/d... ~  initial BMD 10/01 showed TScores -0.2  to -3.2 in the spine...  ~  f/u studies w/ steady improvement on Fosamax therapy... ~  BMD 3/09 w/ TScores -0.1 to -1.7 in the spine... continue Rx. ~  BMD 7/11 showed TScores -2.0 Spine, and -0.3 in right FemNeck... we opted for Bisphos drug holiday to start now. ~  Labs 1/13 showed Vit D level = 48, continue same... ~  BMD 2/15 showed WNL Tscores; rec to continue calcium, MVI, VitD, wt bearing exercise...  PROGRESSIVE MEMORY LOSS >>  ~  MMSE 5/09 by Doran Heater, NP was 29/30... Reassured. ~  3/14: she presented w/ c/o worsening memory & MMSE has diminished- refer to [facility] for eval & rx... ~  Neuro eval 4/14 by DrPenumalli> mild cognitive impairment vs norm aging, he did not rec starting meds... ~  CT Brain 4/14 showed mild atrophy & sm vessel dis ~  She continues to f/u w/ Neurology periodically... ~  1/15: ROV here & husb more concerned re her memory, recall was 0/3 after  distraction; husb has apparently done well on Aricept & she wants trial, start ARICEPT5=>10 daily... ~  She has been followed by DrPenumalli & they added Namenda 10Bid to her Aricept10...  ANXIETY (ICD-300.00) - prev on Alpraz Prn but hasn't used in yrs...  DERMATOLOGY >> LIPOMA (ICD-214.9) - notes large lipoma in RLQ/ rt flank area... some discomfort noted... seen by DrLeone in the past w/o surg... she saw DrCornett, CCS, 1/11 for second opinion & she will decide re: excision... ~  5/13:  She had Basal cell ca removed from the left side of her nose, w/ subseq deeper margins & reports everything is OK... ~  7/13:  She is concerned that the lipoma may be growing & she requests f/u DrCornett for excision... ~  10/13:  Lipoma removed by DrCornett in outpt surg 04/17/12...  SHINGLES - remote hx of right T8-9 shingles in 1986...   Past Surgical History:  Procedure Laterality Date  . APPENDECTOMY  1947  . COLONOSCOPY    . MASS EXCISION  04/17/2012   Procedure: EXCISION MASS;  Surgeon: [name], MD;  Location: [place];  Service: General;  Laterality: N/A;  . THYROIDECTOMY  1990   [name]    Outpatient Encounter Prescriptions as of 08/14/2016  Medication Sig Dispense Refill  . apixaban (ELIQUIS) 2.5 MG TABS tablet Take 1 tablet (2.5 mg total) by mouth 2 (two) times daily. 60 tablet 11  . aspirin 81 MG tablet Take 81 mg by mouth daily.    . Calcium Carbonate-Vitamin D (CALCIUM 600+D) 600-400 MG-UNIT per tablet Take 1 tablet by mouth daily.    . Cyanocobalamin (VITAMIN B 12 PO) Take 1 tablet by mouth daily. Reported on 12/27/2015    . donepezil (ARICEPT) 10 MG tablet Take 1 tablet (10 mg total) by mouth at bedtime. 90 tablet 4  . fish oil-omega-3 fatty acids 1000 MG capsule Take 1 g by mouth daily.    [name] glucosamine-chondroitin 500-400 MG tablet Take 1 tablet by mouth 3 (  three) times daily.    . hydrochlorothiazide (HYDRODIURIL) 25 MG tablet TAKE 1 TABLET (25 MG  TOTAL) BY MOUTH DAILY. 90 tablet 2  . memantine (NAMENDA) 10 MG tablet Take 1 tablet (10 mg total) by mouth 2 (two) times daily. 180 tablet 4  . Multiple Vitamins-Minerals (MULTIVITAMIN & MINERAL PO) Take 1 tablet by mouth daily.    . simvastatin (ZOCOR) 20 MG tablet TAKE 1 TABLET (20 MG TOTAL) BY MOUTH AT BEDTIME. 90 tablet 2  . valsartan (DIOVAN) 160 MG tablet TAKE 1 TABLET (160 MG TOTAL) BY MOUTH DAILY. 90 tablet 0  . verapamil (CALAN-SR) 240 MG CR tablet TAKE ONE TABLET BY MOUTH AT BEDTIME 90 tablet 0  . [DISCONTINUED] apixaban (ELIQUIS) 2.5 MG TABS tablet Take 1 tablet (2.5 mg total) by mouth 2 (two) times daily. 60 tablet 0  . propranolol (INDERAL) 10 MG tablet Take 1 tablet (10 mg total) by mouth 4 (four) times daily as needed (palpitations). (Patient not taking: Reported on 08/14/2016) 30 tablet 0   No facility-administered encounter medications on file as of 08/14/2016.     Allergies  Allergen Reactions  . Epinephrine Palpitations    REACTION: heart racing    Current Medications, Allergies, Past Medical History, Past Surgical History, Family History, and Social History were reviewed in [employer] record.    Review of Systems        See HPI - all other systems neg except as noted...  The patient complains of dyspnea on exertion.  The patient denies anorexia, fever, weight loss, weight gain, vision loss, decreased hearing, hoarseness, chest pain, syncope, peripheral edema, prolonged cough, headaches, hemoptysis, abdominal pain, melena, hematochezia, severe indigestion/heartburn, hematuria, incontinence, muscle weakness, suspicious skin lesions, transient blindness, difficulty walking, depression, unusual weight change, abnormal bleeding, enlarged lymph nodes, and angioedema.   Objective:   Physical Exam    WD, WN, 83 y/o WF in NAD... VITAL SIGNS:  Reviewed... GENERAL:  Alert & oriented; pleasant & cooperative... HEENT:  Bells/AT, EOM-wnl, PERRLA, EACs-clear,  TMs-wnl, NOSE-clear, THROAT-clear & wnl. NECK:  Supple w/ fair ROM; no JVD; normal carotid impulses w/o bruits; scar of prev thyroid surg- no nodules or goiter; no lymphadenopathy. CHEST:  Clear to P & A; without wheezes/ rales/ or rhonchi. HEART:  Regular Rhythm; without murmurs/ rubs/ or gallops. ABDOMEN:  Soft & nontender; normal bowel sounds; no organomegaly or masses detected. EXT: without deformities, mild arthritic changes; no varicose veins/ venous insuffic/ or edema. prob heel spurs w/ some plantar fascia pain> advised soaks & stretching... NEURO:  CN's intact;  no focal neuro deficits... DERM:  Scar in right flank after lipoma excision... no other lesions noted...  MMSE 09/17/12>  She did not know the Pres ("I can see him, a black guy"), VP, or Gov;  Didn't know prev pres etc;  Recall 3/3 immed, but only 1/3 after distraction;  Serial 7s & WORLD backwards were fluent; proverbs were abstracted normally...  RADIOLOGY DATA:  Reviewed in the EPIC EMR & discussed w/ the patient...  LABORATORY DATA:  Reviewed in the EPIC EMR & discussed w/ the patient...   Assessment & Plan:    Macular Degen>  Followed by DrHecker/ Rankin & getting MD shots...  HBP>  on Verap, Diovan, HCT- along w/ no salt; & BP appears to be well controlled & pulse improved (60/min)  ATRIAL FLUTTER EPISODE 06/2016 => spont converted, started on Eliquis2.5Bid & set up to see CARDS...  DYSLIPIDEMIA>  Stable on diet & Simva20.Marland KitchenMarland Kitchen  Borderline DM>  Hx BS in the 120-130 range yrs ago but stable ~100 for the last 3+yrs on diet alone...  Hx Hypothyroidism>  She weaned off Synthroid several yrs ago & TSH has remained normal, clinically euthyroid as well...  GI>  Divertics, Colon Polyps>  Stable & up to date w/ f/u colon 4/13 from DrPerry==> divertics, no polyps...  LIPOMA>  Removed 10/13 from RLQ area by DrCornett...  DJD>  She notes some right knee discomfort playing "pickle ball" & is advised to wear knee brace, take  Aleve prn (she declines further eval or prescription meds)... 10/16>  1 wk hx right knee & LBP; exam shows good ROM right knee w/ crepitation; XRays indicate degen arthritis; we discussed Rx w/ Tramadol+Tylenol, neoprene sleeve for knee and rest/ heat for back; I indicated next step is to refer to Ortho for shot if symptoms don't respond to Rx.  Osteoporosis>  Hx BMD in 2001 w/ TScore =3.2 in Spine; Rx'd Fosamax for 55yr w/ steady improvement & on Bisphos drug holiday since 7/11 & BMD is 2015 was WNL- continue supplements w/ Calcium, MVI, Vit D...  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  MEMORY LOSS>  Followed by for mild dementia w/ MMSE 26/30- DrPenumalli on Aricept & Namenda...  Other medical problems as listed...   Patient's Medications  New Prescriptions   No medications on file  Previous Medications   ASPIRIN 81 MG TABLET    Take 81 mg by mouth daily.   CALCIUM CARBONATE-VITAMIN D (CALCIUM 600+D) 600-400 MG-UNIT PER TABLET    Take 1 tablet by mouth daily.   CYANOCOBALAMIN (VITAMIN B 12 PO)    Take 1 tablet by mouth daily. Reported on 12/27/2015   DONEPEZIL (ARICEPT) 10 MG TABLET    Take 1 tablet (10 mg total) by mouth at bedtime.   FISH OIL-OMEGA-3 FATTY ACIDS 1000 MG CAPSULE    Take 1 g by mouth daily.   GLUCOSAMINE-CHONDROITIN 500-400 MG TABLET    Take 1 tablet by mouth 3 (three) times daily.   HYDROCHLOROTHIAZIDE (HYDRODIURIL) 25 MG TABLET    TAKE 1 TABLET (25 MG TOTAL) BY MOUTH DAILY.   MEMANTINE (NAMENDA) 10 MG TABLET    Take 1 tablet (10 mg total) by mouth 2 (two) times daily.   MULTIPLE VITAMINS-MINERALS (MULTIVITAMIN & MINERAL PO)    Take 1 tablet by mouth daily.   PROPRANOLOL (INDERAL) 10 MG TABLET    Take 1 tablet (10 mg total) by mouth 4 (four) times daily as needed (palpitations).   SIMVASTATIN (ZOCOR) 20 MG TABLET    TAKE 1 TABLET (20 MG TOTAL) BY MOUTH AT BEDTIME.    VALSARTAN (DIOVAN) 160 MG TABLET    TAKE 1 TABLET (160 MG TOTAL) BY MOUTH DAILY.   VERAPAMIL (CALAN-SR) 240 MG CR TABLET    TAKE ONE TABLET BY MOUTH AT BEDTIME  Modified Medications   Modified Medication Previous Medication   APIXABAN (ELIQUIS) 2.5 MG TABS TABLET apixaban (ELIQUIS) 2.5 MG TABS tablet      Take 1 tablet (2.5 mg total) by mouth 2 (two) times daily.    Take 1 tablet (2.5 mg total) by mouth 2 (two) times daily.  Discontinued Medications   No medications on file

## 2016-08-14 NOTE — Patient Instructions (Signed)
Today we updated your med list in our EPIC system...    Continue your current medications the same...  We refilled your prescription for ELIQUIS 2.5mg  one tab twice daily...  Keep your appt w/ DrNelson for a cardiac evaluation...  Call for any questions or if we can be of service in any way...  Let's plan a follow up visit in 41mo, sooner if needed for problems.Marland KitchenMarland Kitchen

## 2016-08-24 ENCOUNTER — Ambulatory Visit (INDEPENDENT_AMBULATORY_CARE_PROVIDER_SITE_OTHER): Payer: Medicare Other | Admitting: Cardiology

## 2016-08-24 ENCOUNTER — Encounter: Payer: Self-pay | Admitting: Cardiology

## 2016-08-24 ENCOUNTER — Other Ambulatory Visit: Payer: Self-pay | Admitting: Pulmonary Disease

## 2016-08-24 VITALS — BP 150/66 | HR 60 | Ht 62.0 in | Wt 142.0 lb

## 2016-08-24 DIAGNOSIS — Z0100 Encounter for examination of eyes and vision without abnormal findings: Secondary | ICD-10-CM | POA: Diagnosis not present

## 2016-08-24 DIAGNOSIS — I10 Essential (primary) hypertension: Secondary | ICD-10-CM | POA: Diagnosis not present

## 2016-08-24 DIAGNOSIS — I480 Paroxysmal atrial fibrillation: Secondary | ICD-10-CM

## 2016-08-24 DIAGNOSIS — Z7901 Long term (current) use of anticoagulants: Secondary | ICD-10-CM | POA: Diagnosis not present

## 2016-08-24 DIAGNOSIS — E782 Mixed hyperlipidemia: Secondary | ICD-10-CM

## 2016-08-24 DIAGNOSIS — Z79899 Other long term (current) drug therapy: Secondary | ICD-10-CM

## 2016-08-24 MED ORDER — WARFARIN SODIUM 5 MG PO TABS: 5.0000 mg | ORAL_TABLET | Freq: Every day | ORAL | 0 refills | Status: DC

## 2016-08-24 NOTE — Progress Notes (Signed)
Cardiology Office Note    Date:  08/25/2016   ID:  Braelee Dorer, DOB 1933-03-24, MRN [id number]  PCP:  Noralee Space, MD  Cardiologist:  Ena Dawley, MD  Referring physician: Noralee Space, MD Chief complain: Follow up for new dg of PAF  History of Present Illness:  Makailey Bosket is a 83 y.o. female with h/o DM, hyperlipidemia, hypertension who presented to the ER on 06/29/2016 with palpitations and was diagnosed with new onset atrial fibrillation with RVR - ventricular rate 140 BPM. The patient was symptomatic with dizziness and felt the onset of atrial fibrillation. While in the ER she spontaneously cardioverted into the SR and was discharged home. CHADVASC score: 5. She was started on Eliquis and reports no bleeding. No palpitations since then.   Today she states that she has been experiencing some DOE, no significant worsening since few years ago, no LE edema, orthopnea, PND, no chest pain. She is concerned about cost of Eliquis and would like to change.    Past Medical History:  Diagnosis Date  . Anxiety   . Borderline diabetes mellitus   . Diverticulosis of colon   . DJD (degenerative joint disease)   . Dyslipidemia   . History of hematuria   . History of osteoporosis   . Hx of colonic polyps   . Hyperlipidemia   . Hypertension   . Hypothyroid   . Lipoma   . Macular degeneration   . Memory loss     Past Surgical History:  Procedure Laterality Date  . APPENDECTOMY  1947  . COLONOSCOPY    . MASS EXCISION  04/17/2012   Procedure: EXCISION MASS;  Surgeon: [name], MD;  Location: [facility];  Service: General;  Laterality: N/A;  . THYROIDECTOMY  1990   [name]   Current Medications: Outpatient Medications Prior to Visit  Medication Sig Dispense Refill  . Calcium Carbonate-Vitamin D (CALCIUM 600+D) 600-400 MG-UNIT per tablet Take 1 tablet by mouth daily.    . Cyanocobalamin (VITAMIN B 12 PO) Take 1 tablet by mouth daily.  Reported on 12/27/2015    . donepezil (ARICEPT) 10 MG tablet Take 1 tablet (10 mg total) by mouth at bedtime. 90 tablet 4  . fish oil-omega-3 fatty acids 1000 MG capsule Take 1 g by mouth daily.    [name] glucosamine-chondroitin 500-400 MG tablet Take 1 tablet by mouth 3 (three) times daily.    . hydrochlorothiazide (HYDRODIURIL) 25 MG tablet TAKE 1 TABLET (25 MG TOTAL) BY MOUTH DAILY. 90 tablet 2  . memantine (NAMENDA) 10 MG tablet Take 1 tablet (10 mg total) by mouth 2 (two) times daily. 180 tablet 4  . Multiple Vitamins-Minerals (MULTIVITAMIN & MINERAL PO) Take 1 tablet by mouth daily.    . propranolol (INDERAL) 10 MG tablet Take 1 tablet (10 mg total) by mouth 4 (four) times daily as needed (palpitations). 30 tablet 0  . simvastatin (ZOCOR) 20 MG tablet TAKE 1 TABLET (20 MG TOTAL) BY MOUTH AT BEDTIME. 90 tablet 2  . verapamil (CALAN-SR) 240 MG CR tablet TAKE ONE TABLET BY MOUTH AT BEDTIME 90 tablet 0  . apixaban (ELIQUIS) 2.5 MG TABS tablet Take 1 tablet (2.5 mg total) by mouth 2 (two) times daily. 60 tablet 11  . aspirin 81 MG tablet Take 81 mg by mouth daily.    . valsartan (DIOVAN) 160 MG tablet TAKE 1 TABLET (160 MG TOTAL) BY MOUTH DAILY. 90 tablet 0   No facility-administered medications prior to  visit.      Allergies:   Epinephrine   Social History   Social History  . Marital status: Married    Spouse name: Mariane Masters Fusaro  . Number of children: 3  . Years of education: [employer]   Occupational History  . seamstress Retired   Social History Main Topics  . Smoking status: Former Smoker    Quit date: 07/10/1965  . Smokeless tobacco: Never Used     Comment: 3 cigs per week x 3 years (started in 1964), 12/27/15 does not smoke  . Alcohol use 0.0 oz/week     Comment: occasionally  . Drug use: No  . Sexual activity: Not Asked   Other Topics Concern  . None   Social History Narrative   Pt lives at home with her spouse Mariane Masters Gramling, who has been dx'd with mild cognitive  impairment vs. mild dementia, former pt of [name]). She does not use caffeine, if so, rarely.     Family History:  The patient's family history includes Stroke in her mother.   ROS:   Please see the history of present illness.    ROS All other systems reviewed and are negative.   PHYSICAL EXAM:   VS:  BP (!) 150/66   Pulse 60   Ht 5\' 2"  (1.575 m)   Wt 142 lb (64.4 kg)   BMI 25.97 kg/m    GEN: Well nourished, well developed, in no acute distress  HEENT: normal  Neck: no JVD, carotid bruits, or masses Cardiac: RRR; no murmurs, rubs, or gallops,no edema  Respiratory:  clear to auscultation bilaterally, normal work of breathing GI: soft, nontender, nondistended, + BS MS: no deformity or atrophy  Skin: warm and dry, no rash Neuro:  Alert and Oriented x 3, Strength and sensation are intact Psych: euthymic mood, full affect  Wt Readings from Last 3 Encounters:  08/24/16 142 lb (64.4 kg)  07/14/16 141 lb (64 kg)  02/09/16 138 lb 2 oz (62.7 kg)      Studies/Labs Reviewed:   EKG:  EKG is ordered today.  The ekg ordered today demonstrates Sinus bradycardia, otherwise normal ECG.   Recent Labs: 06/29/2016: BUN 17; Creatinine, Ser 0.96; Hemoglobin 15.4; Magnesium 2.1; Platelets 224; Potassium 4.1; Sodium 141; TSH 0.449   Lipid Panel    Component Value Date/Time   CHOL 155 08/12/2015 0847   TRIG 71.0 08/12/2015 0847   HDL 60.00 08/12/2015 0847   CHOLHDL 3 08/12/2015 0847   VLDL 14.2 08/12/2015 0847   LDLCALC 80 08/12/2015 0847    Additional studies/ records that were reviewed today include:  No echo in Epic.     ASSESSMENT:    1. PAF (paroxysmal atrial fibrillation) ([place])   2. Chronic anticoagulation   3. Mixed hyperlipidemia   4. Essential hypertension   5. Encounter for eye exam due to high risk medication      PLAN:  In order of problems listed above:  1. The patient remains in [place]. We will continue propranolol and verapamil. She can't afford Eliquis and  would prefer coumadin, we will switch and refer to our coumadin clinic.  2. Order echocardiogram to evaluate for LVEF, valvular abnormalities and LA size. 3. BP is well controlled. 4. On zocor for HLP, tolerated well.   Medication Adjustments/Labs and Tests Ordered: Current medicines are reviewed at length with the patient today.  Concerns regarding medicines are outlined above.  Medication changes, Labs and Tests ordered today are listed in the Patient  Instructions below. Patient Instructions  Medication Instructions:   TAKE YOUR ELIQUIS 2.5 MG TWICE DAILY TODAY 08/24/16, Friday 08/25/16, AND Saturday 08/26/16.  LAST DOSE OF ELIQUIS WILL BE ON Saturday 08/26/16.  THIS MEDICINE WILL BE TAKEN ALONG WITH COUMADIN (WARFARIN) 5 MG BY MOUTH DAILY.  YOU WILL START TAKING COUMADIN 5 MG BY MOUTH DAILY BY ITSELF ON THIS Sunday 08/27/16 WITH NO ELIQUIS INVOLVED.    STOP TAKING ASPIRIN NOW    Testing/Procedures:  Your physician has requested that you have an echocardiogram. Echocardiography is a painless test that uses sound waves to create images of your heart. It provides your doctor with information about the size and shape of your heart and how well your heart's chambers and valves are working. This procedure takes approximately one hour. There are no restrictions for this procedure.    Follow-Up:  3 MONTHS WITH [name]   NEW PATIENT APPOINTMENT WITH COUMADIN CLINIC [facility]ARM-D ON NEXT Tuesday 08/29/16       If you need a refill on your cardiac medications before your next appointment, please call your pharmacy.      Signed, Ena Dawley, MD  08/25/2016 8:54 AM    [address], [place], [place]  [address] Phone: [phone]; Fax: [phone]

## 2016-08-24 NOTE — Patient Instructions (Addendum)
Medication Instructions:   TAKE YOUR ELIQUIS 2.5 MG TWICE DAILY TODAY 08/24/16, Friday 08/25/16, AND Saturday 08/26/16.  LAST DOSE OF ELIQUIS WILL BE ON Saturday 08/26/16.  THIS MEDICINE WILL BE TAKEN ALONG WITH COUMADIN (WARFARIN) 5 MG BY MOUTH DAILY.  YOU WILL START TAKING COUMADIN 5 MG BY MOUTH DAILY BY ITSELF ON THIS Sunday 08/27/16 WITH NO ELIQUIS INVOLVED.    STOP TAKING ASPIRIN NOW    Testing/Procedures:  Your physician has requested that you have an echocardiogram. Echocardiography is a painless test that uses sound waves to create images of your heart. It provides your doctor with information about the size and shape of your heart and how well your heart's chambers and valves are working. This procedure takes approximately one hour. There are no restrictions for this procedure.    Follow-Up:  3 MONTHS WITH [name]   NEW PATIENT APPOINTMENT WITH COUMADIN CLINIC [facility]LE PHARM-D ON NEXT Tuesday 08/29/16       If you need a refill on your cardiac medications before your next appointment, please call your pharmacy.

## 2016-08-29 ENCOUNTER — Ambulatory Visit (INDEPENDENT_AMBULATORY_CARE_PROVIDER_SITE_OTHER): Payer: Medicare Other | Admitting: *Deleted

## 2016-08-29 DIAGNOSIS — I480 Paroxysmal atrial fibrillation: Secondary | ICD-10-CM

## 2016-08-29 DIAGNOSIS — Z5181 Encounter for therapeutic drug level monitoring: Secondary | ICD-10-CM | POA: Diagnosis not present

## 2016-08-29 LAB — POCT INR: INR: 3.8

## 2016-08-29 NOTE — Patient Instructions (Signed)

## 2016-09-05 ENCOUNTER — Ambulatory Visit (INDEPENDENT_AMBULATORY_CARE_PROVIDER_SITE_OTHER): Payer: Medicare Other | Admitting: Pharmacist

## 2016-09-05 DIAGNOSIS — I480 Paroxysmal atrial fibrillation: Secondary | ICD-10-CM | POA: Diagnosis not present

## 2016-09-05 DIAGNOSIS — Z5181 Encounter for therapeutic drug level monitoring: Secondary | ICD-10-CM | POA: Diagnosis not present

## 2016-09-05 LAB — POCT INR: INR: 4.7

## 2016-09-11 ENCOUNTER — Ambulatory Visit (HOSPITAL_COMMUNITY): Payer: Medicare Other | Attending: Cardiovascular Disease

## 2016-09-11 ENCOUNTER — Other Ambulatory Visit: Payer: Self-pay

## 2016-09-11 DIAGNOSIS — I082 Rheumatic disorders of both aortic and tricuspid valves: Secondary | ICD-10-CM | POA: Diagnosis not present

## 2016-09-11 DIAGNOSIS — E785 Hyperlipidemia, unspecified: Secondary | ICD-10-CM | POA: Diagnosis not present

## 2016-09-11 DIAGNOSIS — I480 Paroxysmal atrial fibrillation: Secondary | ICD-10-CM | POA: Diagnosis not present

## 2016-09-11 DIAGNOSIS — Z87891 Personal history of nicotine dependence: Secondary | ICD-10-CM | POA: Insufficient documentation

## 2016-09-11 DIAGNOSIS — I10 Essential (primary) hypertension: Secondary | ICD-10-CM | POA: Diagnosis not present

## 2016-09-14 ENCOUNTER — Ambulatory Visit (INDEPENDENT_AMBULATORY_CARE_PROVIDER_SITE_OTHER): Payer: Medicare Other | Admitting: *Deleted

## 2016-09-14 DIAGNOSIS — I480 Paroxysmal atrial fibrillation: Secondary | ICD-10-CM

## 2016-09-14 DIAGNOSIS — Z5181 Encounter for therapeutic drug level monitoring: Secondary | ICD-10-CM

## 2016-09-14 LAB — POCT INR: INR: 1.4

## 2016-09-21 ENCOUNTER — Ambulatory Visit (INDEPENDENT_AMBULATORY_CARE_PROVIDER_SITE_OTHER): Payer: Medicare Other | Admitting: Pharmacist

## 2016-09-21 DIAGNOSIS — Z5181 Encounter for therapeutic drug level monitoring: Secondary | ICD-10-CM

## 2016-09-21 DIAGNOSIS — I480 Paroxysmal atrial fibrillation: Secondary | ICD-10-CM | POA: Diagnosis not present

## 2016-09-21 LAB — POCT INR: INR: 2.9

## 2016-09-28 ENCOUNTER — Ambulatory Visit (INDEPENDENT_AMBULATORY_CARE_PROVIDER_SITE_OTHER): Payer: Medicare Other | Admitting: *Deleted

## 2016-09-28 DIAGNOSIS — Z5181 Encounter for therapeutic drug level monitoring: Secondary | ICD-10-CM | POA: Diagnosis not present

## 2016-09-28 DIAGNOSIS — I480 Paroxysmal atrial fibrillation: Secondary | ICD-10-CM | POA: Diagnosis not present

## 2016-09-28 LAB — POCT INR: INR: 2.9

## 2016-10-02 ENCOUNTER — Other Ambulatory Visit: Payer: Self-pay | Admitting: Cardiology

## 2016-10-02 DIAGNOSIS — I480 Paroxysmal atrial fibrillation: Secondary | ICD-10-CM

## 2016-10-16 ENCOUNTER — Ambulatory Visit (INDEPENDENT_AMBULATORY_CARE_PROVIDER_SITE_OTHER): Payer: Medicare Other | Admitting: *Deleted

## 2016-10-16 DIAGNOSIS — Z5181 Encounter for therapeutic drug level monitoring: Secondary | ICD-10-CM | POA: Diagnosis not present

## 2016-10-16 DIAGNOSIS — I480 Paroxysmal atrial fibrillation: Secondary | ICD-10-CM

## 2016-10-16 LAB — POCT INR: INR: 2

## 2016-10-22 ENCOUNTER — Other Ambulatory Visit: Payer: Self-pay | Admitting: Pulmonary Disease

## 2016-10-24 ENCOUNTER — Telehealth: Payer: Self-pay | Admitting: Pulmonary Disease

## 2016-10-24 NOTE — Telephone Encounter (Signed)
lmomtcb x1 

## 2016-10-24 NOTE — Telephone Encounter (Signed)
Went out to the lobby to speak with patient She has 1 dose of the Verapamil for tomorrow morning and went to the pharmacy to refill.  Her pharmacist expressed concern about a drug interaction between her Verapamil and Simvastatin:  Severity Level : 1- Contraindicated Drug Combination: This drug combination is contraindicated and generally should not be dispensed or administered to the same patient. Clinical Effects: Concurrent verapamil may result in elevated levels of lovastatin or simvastatin, which may result in rhabdomyolysis.  Pt is aware SN is not in the office this afternoon and is okay for call back tomorrow SN please advise, thank you.

## 2016-10-25 ENCOUNTER — Encounter: Payer: Self-pay | Admitting: *Deleted

## 2016-10-25 ENCOUNTER — Encounter: Payer: Self-pay | Admitting: Pulmonary Disease

## 2016-10-25 NOTE — Telephone Encounter (Signed)
There are several messages on this patient-Pt is aware that Cynthia Mathis is working with SN on this matter and we will wait for decision from SN. Will close this message.

## 2016-10-25 NOTE — Telephone Encounter (Signed)
Per SN---  She has been on the simvastatin without any difficulties in the past.  Ok to change her med to atorvastatin 10 mg  1 daily.  We will bring her back in 3 months once she has been on the med to make sure the medication is working for her.    I have sent the pt a mychart email due to the phones not working.  Will hold message until I receive email back from pt.

## 2016-10-26 MED ORDER — ATORVASTATIN CALCIUM 10 MG PO TABS
10.0000 mg | ORAL_TABLET | Freq: Every day | ORAL | 2 refills | Status: DC
Start: 2016-10-26 — End: 2017-02-02

## 2016-10-26 NOTE — Telephone Encounter (Signed)
See 4/18 pt email- will close encounter.

## 2016-10-26 NOTE — Telephone Encounter (Signed)
Pt's spouse called  He did not read the email Leigh sent  I have relayed to him SN's recs  Rx for 30 day supply sent to pharm  Nothing further needed at this time

## 2016-10-31 ENCOUNTER — Telehealth: Payer: Self-pay | Admitting: Pulmonary Disease

## 2016-10-31 NOTE — Telephone Encounter (Signed)
Per SN: she is actually on 3 BP meds>> HCTZ 25mg  daily (last filled 2/6, 90day supply), Diovan 160mg  daily (last filled 2/17, 90 day supply) and Verapamil 240mg  at bedtime (last filled today, 30day supply).  So what we can do is either 1) take the Verapamil like she's supposed to for about 2 weeks to see what her BP does or 2) we can go ahead and start a 4th BP med today.  Called spoke with patient's spouse Cynthia Mathis Discussed SN's recommendations as stated above Cynthia Mathis stated that last week with all of the confusion about pt's Verapamil and Simvastatin interactions, they decided to refill the Simvastatin and not the Verapamil.  Pt verified that the Verapamil was filled today.  He will have patient continue this medication and call the office if her BP readings do not improve or the worsen.  Cynthia Mathis voiced his understanding.  Nothing further needed; will sign off

## 2016-10-31 NOTE — Telephone Encounter (Signed)
Spoke with pt's spouse (dpr on file), states that pt's BP has been elevated since Saturday- readings including 177/89, 171/92, 168/90- taken on automatic cuff.  Pt notes some dizziness.  Denies HA, heart palpitations, chest pain.   I asked pt what BP meds pt is taking, stated that pt is taking simvastatin.  I advised that this is for her cholesterol and not her bp.  Pt and spouse are unsure of her bp regimen.  Per pt chart she is on valsartan and hctz.  Pt and spouse requesting further recs.    HT on [place].    SN please advise on recs.   Thanks.

## 2016-11-07 ENCOUNTER — Encounter: Payer: Self-pay | Admitting: Cardiology

## 2016-11-09 ENCOUNTER — Ambulatory Visit (INDEPENDENT_AMBULATORY_CARE_PROVIDER_SITE_OTHER): Payer: Medicare Other

## 2016-11-09 DIAGNOSIS — Z5181 Encounter for therapeutic drug level monitoring: Secondary | ICD-10-CM

## 2016-11-09 DIAGNOSIS — I480 Paroxysmal atrial fibrillation: Secondary | ICD-10-CM | POA: Diagnosis not present

## 2016-11-09 DIAGNOSIS — I4891 Unspecified atrial fibrillation: Secondary | ICD-10-CM | POA: Diagnosis not present

## 2016-11-09 LAB — POCT INR: INR: 2.2

## 2016-11-16 ENCOUNTER — Telehealth: Payer: Self-pay | Admitting: Pulmonary Disease

## 2016-11-16 ENCOUNTER — Encounter: Payer: Self-pay | Admitting: Diagnostic Neuroimaging

## 2016-11-16 NOTE — Telephone Encounter (Signed)
Per SN---  SN is out of the office until Monday.  He does rec that you be seen either by GI, Urgent care or we can schedule her an appt next week.  Called and spoke with pt and she requested to be seen by SN next week.  appt for Monday.

## 2016-11-16 NOTE — Telephone Encounter (Signed)
Called and spoke with pt and she stated that she would like to come in and see someone for the abd pain that she is having.  This has been going on for about 2 weeks.  She stated that the pain in around her naval and goes across her abd.  She denies any fever, N./V/ D.  SN no openings in the office for tomorrow with TP or SG or MW.   Please advise. Thanks  Allergies  Allergen Reactions  . Epinephrine Palpitations    REACTION: heart racing

## 2016-11-20 ENCOUNTER — Ambulatory Visit (INDEPENDENT_AMBULATORY_CARE_PROVIDER_SITE_OTHER): Payer: Medicare Other | Admitting: Pulmonary Disease

## 2016-11-20 ENCOUNTER — Encounter: Payer: Self-pay | Admitting: Pulmonary Disease

## 2016-11-20 ENCOUNTER — Other Ambulatory Visit (INDEPENDENT_AMBULATORY_CARE_PROVIDER_SITE_OTHER): Payer: Medicare Other

## 2016-11-20 VITALS — BP 140/70 | HR 55 | Temp 96.8°F | Ht 62.0 in | Wt 141.0 lb

## 2016-11-20 DIAGNOSIS — I480 Paroxysmal atrial fibrillation: Secondary | ICD-10-CM

## 2016-11-20 DIAGNOSIS — I10 Essential (primary) hypertension: Secondary | ICD-10-CM | POA: Diagnosis not present

## 2016-11-20 DIAGNOSIS — R7301 Impaired fasting glucose: Secondary | ICD-10-CM

## 2016-11-20 DIAGNOSIS — R1084 Generalized abdominal pain: Secondary | ICD-10-CM | POA: Diagnosis not present

## 2016-11-20 DIAGNOSIS — M150 Primary generalized (osteo)arthritis: Secondary | ICD-10-CM | POA: Diagnosis not present

## 2016-11-20 DIAGNOSIS — R413 Other amnesia: Secondary | ICD-10-CM

## 2016-11-20 DIAGNOSIS — E039 Hypothyroidism, unspecified: Secondary | ICD-10-CM

## 2016-11-20 DIAGNOSIS — H3530 Unspecified macular degeneration: Secondary | ICD-10-CM

## 2016-11-20 DIAGNOSIS — M159 Polyosteoarthritis, unspecified: Secondary | ICD-10-CM

## 2016-11-20 LAB — CBC WITH DIFFERENTIAL/PLATELET
Basophils Absolute: 0.1 10*3/uL (ref 0.0–0.1)
Basophils Relative: 1.5 % (ref 0.0–3.0)
EOS ABS: 0.1 10*3/uL (ref 0.0–0.7)
Eosinophils Relative: 0.8 % (ref 0.0–5.0)
HEMATOCRIT: 42.9 % (ref 36.0–46.0)
HEMOGLOBIN: 14.5 g/dL (ref 12.0–15.0)
LYMPHS PCT: 24 % (ref 12.0–46.0)
Lymphs Abs: 2.4 10*3/uL (ref 0.7–4.0)
MCHC: 33.7 g/dL (ref 30.0–36.0)
MCV: 90.7 fl (ref 78.0–100.0)
MONO ABS: 0.7 10*3/uL (ref 0.1–1.0)
Monocytes Relative: 7.4 % (ref 3.0–12.0)
Neutro Abs: 6.6 10*3/uL (ref 1.4–7.7)
Neutrophils Relative %: 66.3 % (ref 43.0–77.0)
Platelets: 263 10*3/uL (ref 150.0–400.0)
RBC: 4.74 Mil/uL (ref 3.87–5.11)
RDW: 13.4 % (ref 11.5–15.5)
WBC: 9.9 10*3/uL (ref 4.0–10.5)

## 2016-11-20 LAB — COMPREHENSIVE METABOLIC PANEL
ALK PHOS: 57 U/L (ref 39–117)
ALT: 17 U/L (ref 0–35)
AST: 19 U/L (ref 0–37)
Albumin: 4.2 g/dL (ref 3.5–5.2)
BILIRUBIN TOTAL: 0.5 mg/dL (ref 0.2–1.2)
BUN: 24 mg/dL — ABNORMAL HIGH (ref 6–23)
CALCIUM: 10.1 mg/dL (ref 8.4–10.5)
CO2: 33 mEq/L — ABNORMAL HIGH (ref 19–32)
Chloride: 104 mEq/L (ref 96–112)
Creatinine, Ser: 0.92 mg/dL (ref 0.40–1.20)
GFR: 62.1 mL/min (ref >60.00)
Glucose, Bld: 116 mg/dL — ABNORMAL HIGH (ref 70–99)
Potassium: 4.2 mEq/L (ref 3.5–5.1)
Sodium: 143 mEq/L (ref 135–145)
TOTAL PROTEIN: 7.2 g/dL (ref 6.0–8.3)

## 2016-11-20 LAB — SEDIMENTATION RATE: Sed Rate: 6 mm/hr (ref 0–30)

## 2016-11-20 MED ORDER — DICYCLOMINE HCL 10 MG PO CAPS: 10.0000 mg | ORAL_CAPSULE | Freq: Three times a day (TID) | ORAL | 5 refills | Status: DC

## 2016-11-20 NOTE — Patient Instructions (Signed)
Today we updated your med list in our EPIC system...    Continue your current medications the same...  Today we did some blood work to check your blood count, electrolytes, check for inflammation, AND a CELIAC disease blood test as well...    We will contact you w/ the results when available...   In the meanwhile I want you to start MIRALAX OTC- mix 1 capful of the powder in water & drink this daily...    You can increase the Miralx if the stools remain too hard, and decrease the amt of Miralax if they are too loose...  Also use the Rx BENTYL (Dicyclomine) 10mg  one tab every 6H as needed for abdominal discomfort (spasm or cramping)...  If symptoms persist we will refer you to GI- DrPerry for further evaluation...  Call for any questions.Marland KitchenMarland Kitchen

## 2016-11-20 NOTE — Progress Notes (Signed)
Subjective:    Patient ID: Cynthia Mathis, female    DOB: 10-24-1932, 83 y.o.   MRN: [id number]  HPI 83 y/o WF here for a follow up visit... she has mult med problems as noted below ...  ~  SEE PREV EPIC NOTES FOR OLDER DATA >>   ~  sees DrPerry for GI- divertics, polyps, hems & a bout of diverticulitis in Mar08...  ~  sees Dr Risa Grill Urology w/ microscopic hematuria and a neg eval 5/08... 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 ~  sees DrLLomax for Derm w/ alopecia- offered Rogaine but decided against it... ~  Sees DrPenumali for Neuro w/ memory loss, early stage dementia...   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  September 17, 2012:  concerned about her memory; husb has been under the care of DrLove for >23yr w/ mild cognitive impairment  LABS 3/14:  Chems- wnl w/ BS=106, A1c=6.2;  TSH=0.37;  Sed=14...    January 23, 2013:  visit w/ DrPenumalli 3/14> her MMSE was 29/30 & he planned to check MRI Brain & B12 level, he did not rec starting meds yet (husb takes Razadyne & feels it helps him).  ADDENDUM 04/15/13 >> pt called & wants off Simva20 after reading something about memory loss=> change to Atorva20 (she never did).  CXR 1/15 showed heart at upper lim of norm, lungs clear (mild dev trachea to right c/w enlarged left lobe of thyroid- no change).  EKG 1/15 showed SBrady, rate44, NSSTTW changes.  LABS 1/15:  FLP- at goals on Simva20;  Chems- wnl;  CBC- wnl;  TSH=0.59;  VitD=54.  BMD 08/22/13>  Lowest Tscore= -0.9 in APspine... She is off bisphos therapy for several yrs now & remains improved...   NOTE:  She wants to try ARICEPT10 start 1/2 => 1 tab daily as trial; Pulse is low at 48 & we decided to stop ToprolXL & replace it w/ VALSARTAN160/d.  LABS, Mammogram, CXR, BMD, EKG> all reviewed ...  ~  [id number]  it took quite a bit of time getting her med  list straight- she did not know her meds, her card w/ meds written on it was old & out of date, Epic meds were not up to date either & we called Pharm- pt is reminded to bring all med bottles to every office visit..Marland KitchenMarland Kitchenhe has mult minor somatic complaints...  LABS 2/16:  FLP- at goals on Simva20;  Chems- wnl;  CBC- wnl;  TSH=0.76... ~  [id number]  Cynthia Mathis has mult somatic complaints> runny nose (offered Zyrtek & Astelin but she doesn't want more meds), cold feet, hot & sweaty, thirsty, right knee pain, stepped on a hornets nest w/ 12 stings and reaction; she also reported an episode of syncope [place] was taken to daughter's doctor & reports that everything checked out OK- we do not have the data... She remains very active & plays "pickle ball" everyday.  CT Abd 10/01/14>  Severe diverticulosis & mild acute sigm diverticulitis + inflammed distal rectum/anus c/w acute proctitis; 127msolitary cystic lesion in body of pancreas, gallstones and atherosclerotic changes in Ao...   ~  May 06, 2015:  59m70moV & add-on appt requested by pt due to right knee pain and LBP for the last 1wk she says; no known trauma or injury, and she denies prev problems x occas soreness; she thinks that she  may have had a shot in her knee once several yrs ago; as for the back pain- it is dull, in center of back w/o radiation...     EXAM shows Afeb, VSS, O2sat=97%;  HEENT- neg, mallampati1;  Chest- clear w/o w/r/r;  Heart- RR w/o m/r/g;  Abd- soft, nontender, neg;  Ext- +crepitus in right knee, w/o c/c/e;  Neuro- intact...   XRay right knee 05/06/15> osteoarthtitis w/ medial joint space compartment narrowing with milder patellofemoral joint space compartment narrowing. Marginal osteophytes project from all 3 compartments, mild spurring along the tibial spine, no joint effusion; Bones are demineralized...  XRay LS spine 05/06/15>  Disc space narrowing at L2-3 with endplate sclerosis. Mild osteophytic  changes are seen. Diffuse aortic calcifications are noted. NAD... IMP/PLAN>>  Cynthia Mathis is c/o 1 wk hx right knee & LBP; exam shows good ROM right knee w/ crepitation; XRays indicate degen arthritis; we discussed Rx w/ Tramadol+Tylenol, neoprene sleeve for knee and rest/ heat for back; I indicated next step is to refer to Ortho for shot if symptoms don't respond to Rx...   ~  August 11, 2015:  3moROV & Cynthia Mathis is stable- c/o runny nose, LBP, shoulder pain, right knee pain; she has tried Tramadol w/o much benefit & we discussed adding topical heat, ParafonTid 7 refer to Ortho for further eval... We reviewed the following medical problems during today's office visit >>     Macular Degen> followed by DrHecker & DrRankin; she received shots MD & had bilat cat surg but we do not have notes from them...    Hearing Loss> she saw DrGore, ENT w/ audiology showing bilat SNHL...    HBP> on ASA81, Valsartan160, Verap240, HCT25; BP= 142/68 & she denies HA, CP, palpit, dizzy, SOB, edema, etc...    Hyperlipid> on Simva20; FLP 2/17 show TChol 155, TG 71, HDL 60, LDL 80; continue same meds...    Hx Impaired FBS> Hx BS in the 130 range prev, but all readings ~100 x yrs now;  Labs 2/17 showed BS=94    Hypothy> hx multinod thyroid x yrs; had thyroidectomy 1990 by DrWeatherly; she weaned off Thyroid hormone yrs ago & has remained clinically & biochem euthyroid; Labs 2/17 showed TSH= 0.68    GI- Divertics, Polyps> hx diverticulitis in 2008- followed by DrPerry; last colonoscopy 5/16 showed severe diverticulosis w/o inflamm or polyps seen...    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 Labs 2/17 showed Hg= 14.6 & prev LFTs are WNL.    DJD, Osteoporosis> on Ca, MVI, Glucosamine; she uses OTC analgesic meds as needed; see BMDs below- on Bisphos drug holiday since 7/11 & due for f/u BMD... Labs 2/17 w/ VitD= 44    Memory Loss> eval by  [facility] 4/14, DrPenumalli; felt to have mild cognitive impairment vs normal aging, then w/ progressive impairment & they have her on Donepezil10 & Namenda10Bid now... EXAM shows Afeb, VSS, O2sat=98%;  HEENT- neg, mallampati1;  Chest- clear w/o w/r/r;  Heart- RR w/o m/r/g;  Abd- soft, nontender, neg;  Ext- +crepitus in right knee, w/o c/c/e;  Neuro- intact...   LABS 08/11/15>  FLP- all parameters at goals on Simva20;  Chems- wnl;  CBC- wnl;  TSH=0.68;  VitD=44...  IMP/PLAN>>  Cynthia Mathis is stable- we discussed trying heat, Tramadol, Parafon for her arthritic & LBP complaints; we will refer to Ortho; rec routine ROV in 639mo.   ~  February 09, 2016:  9m71mo  ROV & Cynthia Mathis is stable overall but persists w/ mult somatic complaints like her runny nose (Astelin rx- offered alternative but she does not want new meds); she still has LBP, shoulder pain, knee pain- on Parafon, Tramadol, & heat but she did not go to Ortho for further eval as rec last visit; she is also c/o difficulty hearing & has hearing aides from [facility] but says it isn't right- offered 2nd opinion from ENT & she will let me know;  She also c/o noct leg cramps- we reviewed the usual recs for tonic water vs yellow mustard;  Finally she is also c/o visual difficulties- she had cat surg by DrHecker, and is followed by retina specialist DrRankin w/ macular degen s/p shots...    Macular Degen> followed by DrHecker & DrRankin; she received shots for MD & had bilat cat surg but we do not have notes from them...    Hearing Loss> she saw DrGore, ENT w/ audiology showing bilat SNHL, she has bilat hearing aides but notes that they are not right...    HBP> on ASA81, Valsartan160, Verap240, HCT25; BP= 132/70 & she denies HA, CP, palpit, dizzy, SOB, edema, etc...    Hyperlipid> on Simva20; FLP 2/17 show TChol 155, TG 71, HDL 60, LDL 80; continue same meds...    Hx Impaired FBS> Hx BS in the 130 range prev, but all readings ~100 x yrs now on diet alone;  Labs 2/17  showed BS=94    Hypothy> hx multinod thyroid x yrs; had thyroidectomy 1990 by DrWeatherly; she weaned off Thyroid hormone yrs ago & has remained clinically & biochem euthyroid; Labs 2/17 showed TSH= 0.68    GI- Divertics, Polyps> hx diverticulitis in 2008- followed by DrPerry; last CT Abd 09/2014 w/ severe diverticulosis & mild acute sigmoid diverticulitis- treated w/ Cipro/Flagyl & resolved; f/u colonoscopy 5/16 showed severe diverticulosis w/o inflamm or polyps seen, advised Miralax etc...    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 Labs 2/17 showed Hg= 14.6 & prev LFTs are WNL.    GYN- DrRichardson    DJD, Osteoporosis> on Ca, MVI, Glucosamine; she uses OTC analgesic meds as needed; see BMDs below- on Bisphos drug holiday since 7/11 & due for f/u BMD... Labs 2/17 w/ VitD= 44    Memory Loss> eval by [facility] 4/14, DrPenumalli & recent f/u 12/2015 (MMSE=26) felt to have mild cognitive impairment vs normal aging, then w/ progressive impairment & they have her on Donepezil10 & Namenda10Bid.  EXAM shows Afeb, VSS, O2sat=98%;  HEENT- neg, mallampati1;  Chest- clear w/o w/r/r;  Heart- RR w/o m/r/g;  Abd- soft, nontender, neg;  Ext- +crepitus in right knee, w/o c/c/e;  Neuro- early dementia, no focal neuro changes...  IMP/PLAN>>  Cynthia Mathis has mult somatic complaints and we discussed each (again) & I wonder about relation to her dementia; she will continue current meds and we reviewed tonic water vs yellow mustard for the noct leg cramps & wrote this down on her AVS; we plan ROV in 24mow/ f/u CXR, EKG, Fasting labs... Note: >50% of this 270m visit was spent in counseling & coordination of care...  ~  August 14, 2016:  32m20moV & Mrs RetBerdine Addisons seen in the ER 06/29/16 with palpitations> EKG showed Afib w/ rvr 138, then resolved spont in ER to NSR, Exam was otherw unremarkable, CHADsVASC score=5; CXR- NAD and LABS- ok...  She was placed on Eliquis2.5Bid & Propranolol  22m prn palpit & asked to f/u w/ CARDS;  She had f/u appt here w/ PA- Tammy Parrett on 07/14/16> pt reported doing well- no recurrent palpit, no CP/ SOB/ etc, doing well on Eliquis w/o bleeding/ bruising/ etc; f/u EKG showed holding SBrady, rate 54 => she has a CARDS appt w/ DrNelson 08/24/16...    Her CC= constant right knee pain => Rx w/ OTC Advil/ Aleve / Tylenol prn & we will refer to Ortho for further eval... EXAM shows Afeb, VSS, O2sat=98%;  HEENT- neg, mallampati1;  Chest- clear w/o w/r/r;  Heart- RR w/o m/r/g;  Abd- soft, nontender, neg;  Ext- +crepitus in right knee, w/o c/c/e;  Neuro- early dementia, no focal neuro changes...   CXR 06/29/16>  Norm heart size, clear lungs- NAD, thoracic kyphosis & spondylosis...   EKG 06/29/16>  AFlutter w/ variable conduction, HR=137;  Converted spont to NSR rate 67, EKG otherw wnl...  LABS 06/2016 in Epic>  CBC/ Chems- reviewed & ok;  TSH=0.45 IMP/PLAN>>  Cynthia Mathis had episode of AFlutter w/ rvr & converted spont to NSR, started on Eliquis2.5Bid for ChadsVasc scaore of 5; she has yet to see cards but appt w/ DrNelson set for 08/24/16... Her CC is right knee pain & we will send to Ortho for assessmant...  ADDENDUM>>  She saw DrNelson 08/24/16 HBP, HL, palpit=> episode of PAF w/ rvr 06/2016, spont coverted to NSR in ER w/o intervention, started on Eliquis2.5Bid, no recurrent palpit since then; Eliquis was too expensive & she was switched to Coumadin;  EKG showed SBrady, otherw wnl;  2DEcho performed 09/11/16=> mod focal basilar hypertrophy, norm sys function w/ EF=60-65%, no regional wall motion abn, Gr1DD, AoV mildly thickened calcif leaflets- no AS, mild AI; norm MV, normal LA at 327m norm RV, PAsys=33...   ~  Nov 20, 2016:  62m46moV & add-on appt requested for abd discomfort> She called last wk c/o 2wk hx abd discomfort, mid abd area, not feeling well, but denied n/v/d/ blood seen & no f/c/s etc; she declined to be  seen in ER/ urgent care & set up this appt to see me today;  She is followed for GI by DrPerry w/ hx severe diverticulosis on her last colon 11/2014 and a bout of clinical diverticulitis in 2008 and CT Abd in 09/2014 w/ severe divertics and mild acute sigm diverticulitis treated w/ Flagyl/ Cipro;  SHE FEELS THAT HER SYMPTOMS HAVE IMPROVED OVER THE LAST WEEK ON GLUTEN FREE DIET-- and she tells us Korear the 1st time that several relatives have Celiac disease (her grandmother, her brother, and her son) => we agreed to check Tissue Transglutaminase Antibody test (=> NEG) but her symptoms sound more like IBS-c & we will treat w/ Miralax as rec by DrPerry in the past, plus Bentyl 87m662md prn for abd discomfort...     She is otherw stable on Coumadin for hx PAF, BP controlled on Verap240, Diovan160, HCT25 and electrolytes are wnl...     She remains on Aricept & Namenda and pt & her husb are doing as well as can be expected given their advanced age & underlying issues... EXAM shows Afeb, VSS, O2sat=95%;  HEENT- neg, mallampati1;  Chest- clear w/o w/r/r;  Heart- RR w/o m/r/g;  Abd- soft, nontender, neg;  Ext- +crepitus in right knee, w/o c/c/e;  Neuro- early dementia, no focal neuro changes...   LABS 11/20/16>  Chems- ok w/ BS=116, Cr=0.92, LFTs- wnl;  CBC- wnl w/ Hg=14.5, wbc=9.9;  Sed rate=6;  Tissue Transglutaminase  Ab= NEG...  IMP/PLAN>>  Cynthia Mathis has known severe diverticulosis, hx hyperplastic polyps, hx hems;  Her symptoms sound like mild IBS & DrPerry prev rec Miralax to her but she is not using any of this;  Rec to take Miralax daily & adjust amt to keep stools soft & easy to pass w/o straining;  Plus Bentyl 21m Qid as needed for abd discomfort & cramps...           Problem List:  MACULAR DEGENERATION (ICD-362.50) - eval by DrHecker w/ foveal telangiectasia, macular drusen, macular degeneration, and cataracts- being followed regularly... vision is preserved so far... ~  11/11:  f/u DrHecker- stable age  related mac degen, no retinopathy. ~  11/12:  f/u DrHecker- has advanced bilat AMD, sent to [facility] we don't have his notes... ~  She has been getting shots in the eyes for macular degen, [place].. ~  2/16: she tells me that she continues to receive the shots; she is also sched for bilat cat surg by DrHecker...  HYPERTENSION (ICD-401.9) >>  ~  on VERAPAMIL 2442md,  TOPROL-XL 258m,  HCTZ 4m76m.. Prev on Lisinopril but this was stopped due to throat clearing tic & mild cough, plus K=6.0 at the time (all improved off the ACE)... ~  8/10: borderline BP here and she will monitor at home carefully & call if not <150/90. ~  7/11: she reports that she incr the Lisinopril to 1&1/2 tabs due to BP~150+, now better... ~  7/12:  BP stable on Verap240 & Lisinopril 30mg1mBP= 140/68 today> denies HA, fatigue, visual changes, CP, palipit, dizziness, syncope, dyspnea, edema, etc... ~  1/13:  BP= 160/90 (didn't take med today); states BP are ok at home; continue same meds + low sodium etc... ~  CXR 1/13 showed norm heart size, clear lungs, mild thor spondylosis, NAD... ~ [name]7/13:  BP= 152/82 & she notes some troublesome throat clearing & mild cough; we decided to STOP the ACE & switch to LOSARTAN 100mg/66monitor her symptoms and her blood presure... ~  CXR 9/13 showed calcif Ao, clear lungs, suspect left thyroid enlargement, DJD in Tspine... ~  EKG 10/13 showed SBrady, rate58, wnl, NAD... ~  11/13: she had long convoluted hx of BP med changes- see above- now on Verap240, MetopER25, HCT25, & BP= 126/72; we discussed options but she is content to continue these same meds for now & watch BP... ~  3/14: on ASA81, Metop25, Verap240, HCT25; BP= 140/80 & she denies HA, CP, palpit, dizzy, SOB, edema, etc. ~  1/15: on ASA81, Metop25, Verap240, HCT25; BP= 140/82 & she remains essen asymptomatic; she is bradycardic & we decided to stop ToprolXL & replace it w/ [id number] 1/15 showed  heart at upper lim of norm, mild dev of trach to right, clear lungs, NAD...  ~  EKG 1/15 showed SBrady, rate44, NSSTTWA, NAD... ~  [name]/15: on ASA81, Diovan160, Verap240, HCT25; BP= 120/60 & she is essentially asymptomatic, pulse=60, continue same meds... ~  2/16: on same meds & BP= 128/76, she remains asymptomatic...  PAF >> she had bout of palpit & went to the ER 06/2016 found to be in AFib w/ rvr => spont converted to NSR w/o known recurrence; started on Eliquis2.5Bid & set up to see CARDS. ~  08/2016> she was eval by DrNelson-- changed to Coumadin due to $$ issues and 2DEcho showed mod focal basilar hypertrophy, norm sys function w/ EF=60-65%, no regional wall motion abn, Gr1DD,  AoV mildly thickened calcif leaflets- no AS, mild AI; norm MV, normal LA at 62m, norm RV, PAsys=33...  DYSLIPIDEMIA (ICD-272.4) - on ZOCOR 268md... plus doing well on diet... ~  [place]/09 showed TChol 144, TG 140, HDL 39, LDL 77... continue same. ~  FLP 2/10 showed TChol 164, TG 77, HDL 54, LDL 95 ~  FLP 8/10 showed TChol 138, TG 51, HDL 48, LDL 80 ~  FLP 1/11 showed TChol 155, TG 100, HDL 48, LDL 87 ~  FLP 7/11 showed TChol 158, TG 74, HDL 55, LDL 89 ~  FLP 1/12 showed TChol 155, TG 101, HDL 50, LDL 85 ~  FLP 1/13 on Simva20 showed TChol 169, TG 118, HDL 56, LDL 89  ~  FLP 1/15 on Simva20 showed TChol 155, TG 122, HDL 50, LDL 81 ~  FLP 2/16 on Simva20 showed TChol 161, TG 107, HDL 61, LDL 79; continue same meds. ~  She wanted off Simvastatin & was switched to Atorva10  Hx Impaired Fasting Glucose >> on diet alone... FBS @ home betw 120-130 recently... she is on a good diet and weight is down at 139# today (63"  BMI= 24)... ~  labs 3/09 showed FBS= 119... subseq HgA1c 5/09 was 6.4... diet Rx. ~  labs 8/09 showed BS= 106, HgA1c= 5.9...Marland KitchenMarland Kitchenec- same. ~  labs 2/10 showed BS= 102, A1c= 5.9 ~  labs 1/11 showed BS= 98 ~  labs 1/12 showed BS= 95 ~  Labs 1/13 showed BS= 101 ~  Labs 3/14 showed BS=106, A1c=6.2 ~  Labs 1/15  showed BS= 103 ~  Labs 2/16 showed FBS= 99  HYPOTHYROIDISM (ICD-244.9) - off Synthroid now...  clinically & biochemically euthyroid... [place] she had an eval by DrKohut in 1987 w/ familial multinodular goiter w/ neg FNA... started on Rx but goiter enlarged- had thyroidectomy 1990 by DrWeatherly... ~  labs 3/09 showed TSH = 0.35 (0.35-5.5)... we have slowly decr her dose to 5056md... ~  labs 8/09 showed TSH= 0.33... rec- decr the Synth50 to 1/2 tab daily. ~  labs 2/10 showed =TSH= 0.54 and Synthroid stopped... ~  labs 4/10 off med showed TSH= 0.59 ~  labs 8/10 showed TSH= 0.60 ~  labs 1/11 showed TSH= 0.60 ~  labs 7/11 showed TSH= 0.56 ~  labs 1/12 showed TSH= 0.60 ~  Labs 1/13 showed TSH= 0.58 ~  Labs 3/14 showed TSH= 0.37 ~  Labs 1/15 showed TSH= 0.59 ~  Labs 2/16 showed TSH= 0.76  DIVERTICULOSIS OF COLON (ICD-562.10) COLONIC POLYPS (ICD-211.3) - followed by DrPerry... bout of diverticulitis in Mar08... ~  last colonoscopy 3/08 showed divertics, 2mm52mlyp, hems... path=hyperplastic, f/u planned 66yrs75yr F/u colonoscopy 4/13 by DrPerry showed severe diverticulosis w/o inflamm and w/o polyps etc... ~  2016:  CT Abd 09/2014 w/ severe diverticulosis & mild acute sigmoid diverticulitis- treated w/ Cipro/Flagyl & resolved; f/u colonoscopy 5/16 showed severe diverticulosis w/o inflamm or polyps seen, advised Miralax etc...  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  HEMATURIA, HX OF (ICD-V13.09) - eval 4/08 by DrGrapey w/ neg Sonar, Cysto, cytology...  GYN >> DrKRichardson, [facility])...  DEGENERATIVE JOINT DISEASE (ICD-715.90) - c/o discomfort in her shoulders and knees... uses OTC meds as  needed. ~  04/2015:  She presented w/ 1wk hx right knee & LBP; exam showed crepitation in right knee but good ROM & SLR was neg; Rec to use Tramadol+Tylenol, neoprene support sleeve for knee & rest/heat/ exercises for back; we will refer to Ortho for shots if not responding...   Hx of OSTEOPOROSIS (ICD-733.00) - on Fosamax 70/wk thru 7/11, Caltrate 1/d, MVI, Vit D 1000/d... ~  initial BMD 10/01 showed TScores -0.2  to -3.2 in the spine...  ~  f/u studies w/ steady improvement on Fosamax therapy... ~  BMD 3/09 w/ TScores -0.1 to -1.7 in the spine... continue Rx. ~  BMD 7/11 showed TScores -2.0 Spine, and -0.3 in right FemNeck... we opted for Bisphos drug holiday to start now. ~  Labs 1/13 showed Vit D level = 48, continue same... ~  BMD 2/15 showed WNL Tscores; rec to continue calcium, MVI, VitD, wt bearing exercise...  PROGRESSIVE MEMORY LOSS >>  ~  MMSE 5/09 by Doran Heater, NP was 29/30... Reassured. ~  3/14: she presented w/ c/o worsening memory & MMSE has diminished- refer to [place] for eval & rx... ~  Neuro eval 4/14 by DrPenumalli> mild cognitive impairment vs norm aging, he did not rec starting meds... ~  CT Brain 4/14 showed mild atrophy & sm vessel dis ~  She continues to f/u w/ Neurology periodically... ~  1/15: ROV here & husb more concerned re her memory, recall was 0/3 after distraction; husb has apparently done well on Aricept & she wants trial, start ARICEPT5=>10 daily... ~  She has been followed by DrPenumalli & they added Namenda 10Bid to her Aricept10...  ANXIETY (ICD-300.00) - prev on Alpraz Prn but hasn't used in yrs...  DERMATOLOGY >> LIPOMA (ICD-214.9) - notes large lipoma in RLQ/ rt flank area... some discomfort noted... seen by DrLeone in the past w/o surg... she saw DrCornett, CCS, 1/11 for second opinion & she will decide re: excision... ~  5/13:  She had Basal cell ca removed from the left side of her nose, w/ subseq deeper margins & reports everything is  OK... ~  7/13:  She is concerned that the lipoma may be growing & she requests f/u DrCornett for excision... ~  10/13:  Lipoma removed by DrCornett in outpt surg 04/17/12...  SHINGLES - remote hx of right T8-9 shingles in 1986...   Past Surgical History:  Procedure Laterality Date  . APPENDECTOMY  1947  . COLONOSCOPY    . MASS EXCISION  04/17/2012   Procedure: EXCISION MASS;  Surgeon: [name], MD;  Location: [place];  Service: General;  Laterality: N/A;  . THYROIDECTOMY  1990   [name]    Outpatient Encounter Prescriptions as of 11/20/2016  Medication Sig  . atorvastatin (LIPITOR) 10 MG tablet Take 1 tablet (10 mg total) by mouth daily.  . Calcium Carbonate-Vitamin D (CALCIUM 600+D) 600-400 MG-UNIT per tablet Take 1 tablet by mouth daily.  . Cyanocobalamin (VITAMIN B 12 PO) Take 1 tablet by mouth daily. Reported on 12/27/2015  . donepezil (ARICEPT) 10 MG tablet Take 1 tablet (10 mg total) by mouth at bedtime.  . fish oil-omega-3 fatty acids 1000 MG capsule Take 1 g by mouth daily.  [name] glucosamine-chondroitin 500-400 MG tablet Take 1 tablet by mouth 3 (three) times daily.  . hydrochlorothiazide (HYDRODIURIL) 25 MG tablet TAKE 1 TABLET (25 MG TOTAL) BY MOUTH DAILY.  . memantine (NAMENDA) 10 MG tablet Take 1 tablet (10 mg total) by mouth  2 (two) times daily.  . Multiple Vitamins-Minerals (MULTIVITAMIN & MINERAL PO) Take 1 tablet by mouth daily.  . valsartan (DIOVAN) 160 MG tablet TAKE 1 TABLET (160 MG TOTAL) BY MOUTH DAILY.  . verapamil (CALAN-SR) 240 MG CR tablet TAKE ONE TABLET BY MOUTH EVERY NIGHT AT BEDTIME  . warfarin (COUMADIN) 5 MG tablet Take as directed by coumadin clinic  . dicyclomine (BENTYL) 10 MG capsule Take 1 capsule (10 mg total) by mouth 4 (four) times daily -  before meals and at bedtime.  . [DISCONTINUED] propranolol (INDERAL) 10 MG tablet Take 1 tablet (10 mg total) by mouth 4 (four) times daily as needed (palpitations). (Patient not  taking: Reported on 11/20/2016)  . [DISCONTINUED] simvastatin (ZOCOR) 20 MG tablet TAKE 1 TABLET (20 MG TOTAL) BY MOUTH AT BEDTIME. (Patient not taking: Reported on 11/20/2016)   No facility-administered encounter medications on file as of 11/20/2016.     Allergies  Allergen Reactions  . Epinephrine Palpitations    REACTION: heart racing    Immunization History  Administered Date(s) Administered  . DTP 04/17/2000  . Influenza Split 05/20/2011, 05/15/2012  . Influenza Whole 05/02/2010  . Influenza, High Dose Seasonal PF 05/08/2013  . Influenza,inj,Quad PF,36+ Mos 05/25/2014, 06/29/2015, 06/13/2016  . Pneumococcal Polysaccharide-23 04/17/2000  . Tdap 08/04/2013    Current Medications, Allergies, Past Medical History, Past Surgical History, Family History, and Social History were reviewed in [employer] record.    Review of Systems        See HPI - all other systems neg except as noted...  The patient complains of dyspnea on exertion.  The patient denies anorexia, fever, weight loss, weight gain, vision loss, decreased hearing, hoarseness, chest pain, syncope, peripheral edema, prolonged cough, headaches, hemoptysis, abdominal pain, melena, hematochezia, severe indigestion/heartburn, hematuria, incontinence, muscle weakness, suspicious skin lesions, transient blindness, difficulty walking, depression, unusual weight change, abnormal bleeding, enlarged lymph nodes, and angioedema.   Objective:   Physical Exam    WD, WN, 83 y/o WF in NAD... VITAL SIGNS:  Reviewed... GENERAL:  Alert & oriented; pleasant & cooperative... HEENT:  [place]/AT, EOM-wnl, PERRLA, EACs-clear, TMs-wnl, NOSE-clear, THROAT-clear & wnl. NECK:  Supple w/ fair ROM; no JVD; normal carotid impulses w/o bruits; scar of prev thyroid surg- no nodules or goiter; no lymphadenopathy. CHEST:  Clear to P & A; without wheezes/ rales/ or rhonchi. HEART:  Regular Rhythm; without murmurs/ rubs/ or  gallops. ABDOMEN:  Soft & nontender; normal bowel sounds; no organomegaly or masses detected. EXT: without deformities, mild arthritic changes; no varicose veins/ venous insuffic/ or edema. prob heel spurs w/ some plantar fascia pain> advised soaks & stretching... NEURO:  CN's intact;  no focal neuro deficits... DERM:  Scar in right flank after lipoma excision... no other lesions noted...  MMSE 09/17/12>  She did not know the Pres ("I can see him, a black guy"), VP, or Gov;  Didn't know prev pres etc;  Recall 3/3 immed, but only 1/3 after distraction;  Serial 7s & WORLD backwards were fluent; proverbs were abstracted normally...  RADIOLOGY DATA:  Reviewed in the EPIC EMR & discussed w/ the patient...  LABORATORY DATA:  Reviewed in the EPIC EMR & discussed w/ the patient...   Assessment & Plan:    Macular Degen>  Followed by DrHecker/ [place] & getting MD shots...  HBP>  on Verap, Diovan, HCT- along w/ no salt; & BP appears to be well controlled & pulse improved (60/min)  ATRIAL FLUTTER EPISODE 06/2016 => spont converted,  started on Eliquis2.5Bid & set up to see CARDS...  DYSLIPIDEMIA>  Stable on diet & Simva20...  Borderline DM>  Hx BS in the 120-130 range yrs ago but stable ~100 for the last 3+yrs on diet alone...  Hx Hypothyroidism>  She weaned off Synthroid several yrs ago & TSH has remained normal, clinically euthyroid as well...  GI>  Divertics, Colon Polyps>  Stable & up to date w/ f/u colon 2016 from DrPerry==> divertics, no polyps...  LIPOMA>  Removed 10/13 from RLQ area by DrCornett...  DJD>  She notes some right knee discomfort playing "pickle ball" & is advised to wear knee brace, take Aleve prn (she declines further eval or prescription meds)... 10/16>  1 wk hx right knee & LBP; exam shows good ROM right knee w/ crepitation; XRays indicate degen arthritis; we discussed Rx w/ Tramadol+Tylenol, neoprene sleeve for knee and rest/ heat for back; I indicated next step is to  refer to Ortho for shot if symptoms don't respond to Rx.  Osteoporosis>  Hx BMD in 2001 w/ TScore =3.2 in Spine; Rx'd Fosamax for 21yr w/ steady improvement & on Bisphos drug holiday since 7/11 & BMD is 2015 was WNL- continue supplements w/ Calcium, MVI, Vit D...  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  MEMORY LOSS>  Followed by for mild dementia w/ MMSE 26/30- DrPenumalli on Aricept & Namenda...  Other medical problems as listed...   Patient's Medications  New Prescriptions   DICYCLOMINE (BENTYL) 10 MG CAPSULE    Take 1 capsule (10 mg total) by mouth 4 (four) times daily -  before meals and at bedtime.  Previous Medications   ATORVASTATIN (LIPITOR) 10 MG TABLET    Take 1 tablet (10 mg total) by mouth daily.   CALCIUM CARBONATE-VITAMIN D (CALCIUM 600+D) 600-400 MG-UNIT PER TABLET    Take 1 tablet by mouth daily.   CYANOCOBALAMIN (VITAMIN B 12 PO)    Take 1 tablet by mouth daily. Reported on 12/27/2015   DONEPEZIL (ARICEPT) 10 MG TABLET    Take 1 tablet (10 mg total) by mouth at bedtime.   FISH OIL-OMEGA-3 FATTY ACIDS 1000 MG CAPSULE    Take 1 g by mouth daily.   GLUCOSAMINE-CHONDROITIN 500-400 MG TABLET    Take 1 tablet by mouth 3 (three) times daily.   HYDROCHLOROTHIAZIDE (HYDRODIURIL) 25 MG TABLET    TAKE 1 TABLET (25 MG TOTAL) BY MOUTH DAILY.   MEMANTINE (NAMENDA) 10 MG TABLET    Take 1 tablet (10 mg total) by mouth 2 (two) times daily.   MULTIPLE VITAMINS-MINERALS (MULTIVITAMIN & MINERAL PO)    Take 1 tablet by mouth daily.   VALSARTAN (DIOVAN) 160 MG TABLET    TAKE 1 TABLET (160 MG TOTAL) BY MOUTH DAILY.   VERAPAMIL (CALAN-SR) 240 MG CR TABLET    TAKE ONE TABLET BY MOUTH EVERY NIGHT AT BEDTIME   WARFARIN (COUMADIN) 5 MG TABLET    Take as directed by coumadin clinic  Modified Medications   No medications on file  Discontinued Medications   PROPRANOLOL (INDERAL) 10 MG TABLET    Take 1  tablet (10 mg total) by mouth 4 (four) times daily as needed (palpitations).   SIMVASTATIN (ZOCOR) 20 MG TABLET    TAKE 1 TABLET (20 MG TOTAL) BY MOUTH AT BEDTIME.

## 2016-11-23 LAB — TISSUE TRANSGLUTAMINASE ABS,IGG,IGA
TISSUE TRANSGLUT AB: 2 U/mL (ref <6)
Tissue Transglutaminase Ab, IgA: 1 U/mL (ref <4)

## 2016-11-24 ENCOUNTER — Encounter: Payer: Self-pay | Admitting: Cardiology

## 2016-11-24 ENCOUNTER — Ambulatory Visit (INDEPENDENT_AMBULATORY_CARE_PROVIDER_SITE_OTHER): Payer: Medicare Other | Admitting: Cardiology

## 2016-11-24 VITALS — BP 122/52 | HR 66 | Ht 62.0 in | Wt 143.0 lb

## 2016-11-24 DIAGNOSIS — E782 Mixed hyperlipidemia: Secondary | ICD-10-CM

## 2016-11-24 DIAGNOSIS — Z7901 Long term (current) use of anticoagulants: Secondary | ICD-10-CM

## 2016-11-24 DIAGNOSIS — I10 Essential (primary) hypertension: Secondary | ICD-10-CM

## 2016-11-24 DIAGNOSIS — I480 Paroxysmal atrial fibrillation: Secondary | ICD-10-CM

## 2016-11-24 NOTE — Patient Instructions (Signed)

## 2016-11-24 NOTE — Progress Notes (Signed)
Cardiology Office Note    Date:  11/24/2016   ID:  Cynthia Mathis, DOB May 09, 1933, MRN [id number]  PCP:  Noralee Space, MD  Cardiologist:  Ena Dawley, MD  Referring physician: Noralee Space, MD Chief complain: Follow up for new dg of PAF  History of Present Illness:  Cynthia Mathis is a 83 y.o. female with h/o DM, hyperlipidemia, hypertension who presented to the ER on 06/29/2016 with palpitations and was diagnosed with new onset atrial fibrillation with RVR - ventricular rate 140 BPM. The patient was symptomatic with dizziness and felt the onset of atrial fibrillation. While in the ER she spontaneously cardioverted into the SR and was discharged home. CHADVASC score: 5. She was started on Eliquis and reports no bleeding. No palpitations since then.   11/24/2016 - the patient is coming after 3 months, since the last visit she hasn't experienced any chest pain shortness of breath no dizziness no palpitation presyncope or syncope. She denies any bleeding with warfarin and no muscle pain with Lipitor. She has been experiencing abdominal pains and diarrhea as and might possibly need a colonoscopy.   Past Medical History:  Diagnosis Date  . Anxiety   . Borderline diabetes mellitus   . Diverticulosis of colon   . DJD (degenerative joint disease)   . Dyslipidemia   . History of hematuria   . History of osteoporosis   . Hx of colonic polyps   . Hyperlipidemia   . Hypertension   . Hypothyroid   . Lipoma   . Macular degeneration   . Memory loss     Past Surgical History:  Procedure Laterality Date  . APPENDECTOMY  1947  . COLONOSCOPY    . MASS EXCISION  04/17/2012   Procedure: EXCISION MASS;  Surgeon: [name], MD;  Location: [facility];  Service: General;  Laterality: N/A;  . THYROIDECTOMY  1990   [name]   Current Medications: Outpatient Medications Prior to Visit  Medication Sig Dispense Refill  . atorvastatin (LIPITOR) 10 MG tablet  Take 1 tablet (10 mg total) by mouth daily. 30 tablet 2  . Calcium Carbonate-Vitamin D (CALCIUM 600+D) 600-400 MG-UNIT per tablet Take 1 tablet by mouth daily.    . Cyanocobalamin (VITAMIN B 12 PO) Take 1 tablet by mouth daily. Reported on 12/27/2015    . dicyclomine (BENTYL) 10 MG capsule Take 1 capsule (10 mg total) by mouth 4 (four) times daily -  before meals and at bedtime. 90 capsule 5  . donepezil (ARICEPT) 10 MG tablet Take 1 tablet (10 mg total) by mouth at bedtime. 90 tablet 4  . fish oil-omega-3 fatty acids 1000 MG capsule Take 1 g by mouth daily.    [name] glucosamine-chondroitin 500-400 MG tablet Take 1 tablet by mouth 3 (three) times daily.    . hydrochlorothiazide (HYDRODIURIL) 25 MG tablet TAKE 1 TABLET (25 MG TOTAL) BY MOUTH DAILY. 90 tablet 2  . memantine (NAMENDA) 10 MG tablet Take 1 tablet (10 mg total) by mouth 2 (two) times daily. 180 tablet 4  . Multiple Vitamins-Minerals (MULTIVITAMIN & MINERAL PO) Take 1 tablet by mouth daily.    . valsartan (DIOVAN) 160 MG tablet TAKE 1 TABLET (160 MG TOTAL) BY MOUTH DAILY. 90 tablet 0  . verapamil (CALAN-SR) 240 MG CR tablet TAKE ONE TABLET BY MOUTH EVERY NIGHT AT BEDTIME 90 tablet 0  . warfarin (COUMADIN) 5 MG tablet Take as directed by coumadin clinic 30 tablet 3   No facility-administered  medications prior to visit.      Allergies:   Epinephrine   Social History   Social History  . Marital status: Married    Spouse name: Mariane Masters Schellinger  . Number of children: 3  . Years of education: [employer]   Occupational History  . seamstress Retired   Social History Main Topics  . Smoking status: Former Smoker    Quit date: 07/10/1965  . Smokeless tobacco: Never Used     Comment: 3 cigs per week x 3 years (started in 1964), 12/27/15 does not smoke  . Alcohol use 0.0 oz/week     Comment: occasionally  . Drug use: No  . Sexual activity: Not Asked   Other Topics Concern  . None   Social History Narrative   Pt lives at home with her  spouse Mariane Masters Tennyson, who has been dx'd with mild cognitive impairment vs. mild dementia, former pt of [name]). She does not use caffeine, if so, rarely.     Family History:  The patient's family history includes Stroke in her mother.   ROS:   Please see the history of present illness.    ROS All other systems reviewed and are negative.   PHYSICAL EXAM:   VS:  BP (!) 122/52   Pulse 66   Ht 5\' 2"  (1.575 m)   Wt 143 lb (64.9 kg)   SpO2 96%   BMI 26.16 kg/m    GEN: Well nourished, well developed, in no acute distress  HEENT: normal  Neck: no JVD, carotid bruits, or masses Cardiac: RRR; no murmurs, rubs, or gallops,no edema  Respiratory:  clear to auscultation bilaterally, normal work of breathing GI: soft, nontender, nondistended, + BS MS: no deformity or atrophy  Skin: warm and dry, no rash Neuro:  Alert and Oriented x 3, Strength and sensation are intact Psych: euthymic mood, full affect  Wt Readings from Last 3 Encounters:  11/24/16 143 lb (64.9 kg)  11/20/16 141 lb (64 kg)  08/24/16 142 lb (64.4 kg)      Studies/Labs Reviewed:   EKG:  EKG is ordered today.  The ekg ordered today demonstrates Sinus bradycardia, otherwise normal ECG.   Recent Labs: 06/29/2016: Magnesium 2.1; TSH 0.449 11/20/2016: ALT 17; BUN 24; Creatinine, Ser 0.92; Hemoglobin 14.5; Platelets 263.0; Potassium 4.2; Sodium 143   Lipid Panel    Component Value Date/Time   CHOL 155 08/12/2015 0847   TRIG 71.0 08/12/2015 0847   HDL 60.00 08/12/2015 0847   CHOLHDL 3 08/12/2015 0847   VLDL 14.2 08/12/2015 0847   LDLCALC 80 08/12/2015 0847    Additional studies/ records that were reviewed today include:   TTE: 09/2016 - Left ventricle: The cavity size was normal. There was moderate   focal basal hypertrophy of the septum with mild posterior wall   hypertrophy. Systolic function was normal. The estimated ejection   fraction was in the range of 60% to 65%. Wall motion was normal;   there were no  regional wall motion abnormalities. Doppler   parameters are consistent with abnormal left ventricular   relaxation (grade 1 diastolic dysfunction). Doppler parameters   are consistent with indeterminate ventricular filling pressure. - Aortic valve: Transvalvular velocity was within the normal range.   There was no stenosis. There was mild regurgitation. - Right ventricle: The cavity size was normal. Wall thickness was   normal. Systolic function was normal. - Atrial septum: No defect or patent foramen ovale was identified   by color flow  Doppler. - Tricuspid valve: There was mild regurgitation. - Pulmonary arteries: Systolic pressure was within the normal   range. PA peak pressure: 33 mm Hg (S).    ASSESSMENT:    1. PAF (paroxysmal atrial fibrillation) ([place])   2. Chronic anticoagulation   3. Mixed hyperlipidemia   4. Essential hypertension      PLAN:  In order of problems listed above:  1. The patient remains in [place]. We will continue verapamil. Continue coumadin, bleeding, therapeutic INR. LVEF 60-65%, normal left atrial size. 2. BP is well controlled. 3. On zocor for HLP, tolerated well.  4. If the patient needs a colonoscopy, she can discontinue coumadin without bridging, She is advised to call our Coumadin clinic for further advice.  Medication Adjustments/Labs and Tests Ordered: Current medicines are reviewed at length with the patient today.  Concerns regarding medicines are outlined above.  Medication changes, Labs and Tests ordered today are listed in the Patient Instructions below. Patient Instructions  Medication Instructions:   Your physician recommends that you continue on your current medications as directed. Please refer to the Current Medication list given to you today.     Follow-Up:  Your physician wants you to follow-up in: [place] will receive a reminder letter in the mail two months in advance. If you don't receive a letter, please  call our office to schedule the follow-up appointment.        If you need a refill on your cardiac medications before your next appointment, please call your pharmacy.      Signed, Ena Dawley, MD  11/24/2016 4:04 PM    [facility] [address], [place], [place]  [address] Phone: [phone]; Fax: [phone]

## 2016-11-26 ENCOUNTER — Other Ambulatory Visit: Payer: Self-pay | Admitting: Pulmonary Disease

## 2016-11-29 ENCOUNTER — Telehealth: Payer: Self-pay | Admitting: Pulmonary Disease

## 2016-11-29 NOTE — Telephone Encounter (Signed)
Notes recorded by Noralee Space, MD on 11/23/2016 at 5:12 PM EDT Please notify patient>  Chems are essentially wnl; BS=116 (borderline), renal wnl, LFTs wnl... CBC is normal (not anemic & white count normal as well... Sed rate (inflamm test is NEG/ wnl... Test for Celiac Dis is NEG- she does not have it... All symptoms & labs eval are c/w IBS-D & we treated her w/ Miralax + Bentyl prn ---------------------- Spoke with pt, aware of results/recs.  Nothing further needed.

## 2016-12-07 ENCOUNTER — Ambulatory Visit (INDEPENDENT_AMBULATORY_CARE_PROVIDER_SITE_OTHER): Payer: Medicare Other

## 2016-12-07 DIAGNOSIS — Z5181 Encounter for therapeutic drug level monitoring: Secondary | ICD-10-CM | POA: Diagnosis not present

## 2016-12-07 DIAGNOSIS — I480 Paroxysmal atrial fibrillation: Secondary | ICD-10-CM | POA: Diagnosis not present

## 2016-12-07 DIAGNOSIS — I4891 Unspecified atrial fibrillation: Secondary | ICD-10-CM | POA: Diagnosis not present

## 2016-12-07 LAB — POCT INR: INR: 2.2

## 2016-12-19 DIAGNOSIS — H26492 Other secondary cataract, left eye: Secondary | ICD-10-CM | POA: Diagnosis not present

## 2016-12-19 DIAGNOSIS — H353212 Exudative age-related macular degeneration, right eye, with inactive choroidal neovascularization: Secondary | ICD-10-CM | POA: Diagnosis not present

## 2016-12-19 DIAGNOSIS — H35351 Cystoid macular degeneration, right eye: Secondary | ICD-10-CM | POA: Diagnosis not present

## 2016-12-19 DIAGNOSIS — H353133 Nonexudative age-related macular degeneration, bilateral, advanced atrophic without subfoveal involvement: Secondary | ICD-10-CM | POA: Diagnosis not present

## 2016-12-27 ENCOUNTER — Ambulatory Visit: Payer: Medicare Other | Admitting: Diagnostic Neuroimaging

## 2017-01-09 ENCOUNTER — Telehealth: Payer: Self-pay | Admitting: Diagnostic Neuroimaging

## 2017-01-09 NOTE — Telephone Encounter (Signed)
Pt is coming for yearly appt- pt of Dr Leta Baptist, she is seen for memory issues. Her appt is 9/18 with Hoyle Sauer

## 2017-01-09 NOTE — Telephone Encounter (Signed)
Noted  

## 2017-01-16 ENCOUNTER — Ambulatory Visit: Payer: Medicare Other | Admitting: Diagnostic Neuroimaging

## 2017-01-24 ENCOUNTER — Ambulatory Visit (INDEPENDENT_AMBULATORY_CARE_PROVIDER_SITE_OTHER): Payer: Medicare Other | Admitting: *Deleted

## 2017-01-24 DIAGNOSIS — I4891 Unspecified atrial fibrillation: Secondary | ICD-10-CM

## 2017-01-24 DIAGNOSIS — I480 Paroxysmal atrial fibrillation: Secondary | ICD-10-CM | POA: Diagnosis not present

## 2017-01-24 DIAGNOSIS — Z5181 Encounter for therapeutic drug level monitoring: Secondary | ICD-10-CM

## 2017-01-24 LAB — POCT INR: INR: 2.6

## 2017-01-29 ENCOUNTER — Other Ambulatory Visit: Payer: Self-pay | Admitting: Pulmonary Disease

## 2017-01-30 ENCOUNTER — Other Ambulatory Visit: Payer: Self-pay | Admitting: Pulmonary Disease

## 2017-02-02 ENCOUNTER — Telehealth: Payer: Self-pay | Admitting: Pulmonary Disease

## 2017-02-02 DIAGNOSIS — Z1231 Encounter for screening mammogram for malignant neoplasm of breast: Secondary | ICD-10-CM | POA: Diagnosis not present

## 2017-02-02 MED ORDER — VERAPAMIL HCL ER 240 MG PO TBCR: 240.0000 mg | EXTENDED_RELEASE_TABLET | Freq: Every day | ORAL | 1 refills | Status: DC

## 2017-02-02 MED ORDER — ATORVASTATIN CALCIUM 10 MG PO TABS: 10.0000 mg | ORAL_TABLET | Freq: Every day | ORAL | 1 refills | Status: DC

## 2017-02-02 NOTE — Telephone Encounter (Signed)
Spoke with the pt to verify this msg  Rxs were sent to pharm  Nothing further needed

## 2017-02-12 ENCOUNTER — Encounter: Payer: Self-pay | Admitting: Pulmonary Disease

## 2017-02-12 ENCOUNTER — Ambulatory Visit (INDEPENDENT_AMBULATORY_CARE_PROVIDER_SITE_OTHER): Payer: Medicare Other | Admitting: Pulmonary Disease

## 2017-02-12 VITALS — BP 152/78 | HR 63 | Temp 97.6°F | Ht 62.0 in | Wt 145.1 lb

## 2017-02-12 DIAGNOSIS — M8589 Other specified disorders of bone density and structure, multiple sites: Secondary | ICD-10-CM | POA: Diagnosis not present

## 2017-02-12 DIAGNOSIS — I480 Paroxysmal atrial fibrillation: Secondary | ICD-10-CM

## 2017-02-12 DIAGNOSIS — E039 Hypothyroidism, unspecified: Secondary | ICD-10-CM | POA: Diagnosis not present

## 2017-02-12 DIAGNOSIS — M150 Primary generalized (osteo)arthritis: Secondary | ICD-10-CM

## 2017-02-12 DIAGNOSIS — H3530 Unspecified macular degeneration: Secondary | ICD-10-CM | POA: Diagnosis not present

## 2017-02-12 DIAGNOSIS — R413 Other amnesia: Secondary | ICD-10-CM

## 2017-02-12 DIAGNOSIS — R7301 Impaired fasting glucose: Secondary | ICD-10-CM

## 2017-02-12 DIAGNOSIS — M159 Polyosteoarthritis, unspecified: Secondary | ICD-10-CM

## 2017-02-12 DIAGNOSIS — I10 Essential (primary) hypertension: Secondary | ICD-10-CM

## 2017-02-12 MED ORDER — VERAPAMIL HCL ER 240 MG PO TBCR: 240.0000 mg | EXTENDED_RELEASE_TABLET | Freq: Every day | ORAL | 3 refills | Status: DC

## 2017-02-12 MED ORDER — HYDROCHLOROTHIAZIDE 25 MG PO TABS: 25.0000 mg | ORAL_TABLET | Freq: Every day | ORAL | 3 refills | Status: DC

## 2017-02-12 MED ORDER — ATORVASTATIN CALCIUM 10 MG PO TABS: 10.0000 mg | ORAL_TABLET | Freq: Every day | ORAL | 1 refills | Status: DC

## 2017-02-12 MED ORDER — DICYCLOMINE HCL 10 MG PO CAPS: 10.0000 mg | ORAL_CAPSULE | Freq: Three times a day (TID) | ORAL | 5 refills | Status: DC

## 2017-02-12 MED ORDER — VALSARTAN 160 MG PO TABS: ORAL_TABLET | ORAL | 3 refills | Status: DC

## 2017-02-12 NOTE — Progress Notes (Signed)
Subjective:    Patient ID: Cynthia Mathis, female    DOB: 10-24-1932, 83 y.o.   MRN: [id number]  HPI 83 y/o WF here for a follow up visit... she has mult med problems as noted below ...  ~  SEE PREV EPIC NOTES FOR OLDER DATA >>   ~  sees DrPerry for GI- divertics, polyps, hems & a bout of diverticulitis in Mar08...  ~  sees Dr Risa Grill Urology w/ microscopic hematuria and a neg eval 5/08... 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 ~  sees DrLLomax for Derm w/ alopecia- offered Rogaine but decided against it... ~  Sees DrPenumali for Neuro w/ memory loss, early stage dementia...   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  September 17, 2012:  concerned about her memory; husb has been under the care of DrLove for >23yr w/ mild cognitive impairment  LABS 3/14:  Chems- wnl w/ BS=106, A1c=6.2;  TSH=0.37;  Sed=14...    January 23, 2013:  visit w/ DrPenumalli 3/14> her MMSE was 29/30 & he planned to check MRI Brain & B12 level, he did not rec starting meds yet (husb takes Razadyne & feels it helps him).  ADDENDUM 04/15/13 >> pt called & wants off Simva20 after reading something about memory loss=> change to Atorva20 (she never did).  CXR 1/15 showed heart at upper lim of norm, lungs clear (mild dev trachea to right c/w enlarged left lobe of thyroid- no change).  EKG 1/15 showed SBrady, rate44, NSSTTW changes.  LABS 1/15:  FLP- at goals on Simva20;  Chems- wnl;  CBC- wnl;  TSH=0.59;  VitD=54.  BMD 08/22/13>  Lowest Tscore= -0.9 in APspine... She is off bisphos therapy for several yrs now & remains improved...   NOTE:  She wants to try ARICEPT10 start 1/2 => 1 tab daily as trial; Pulse is low at 48 & we decided to stop ToprolXL & replace it w/ VALSARTAN160/d.  LABS, Mammogram, CXR, BMD, EKG> all reviewed ...  ~  [id number]  it took quite a bit of time getting her med  list straight- she did not know her meds, her card w/ meds written on it was old & out of date, Epic meds were not up to date either & we called Pharm- pt is reminded to bring all med bottles to every office visit..Marland KitchenMarland Kitchenhe has mult minor somatic complaints...  LABS 2/16:  FLP- at goals on Simva20;  Chems- wnl;  CBC- wnl;  TSH=0.76... ~  [id number]  [name] has mult somatic complaints> runny nose (offered Zyrtek & Astelin but she doesn't want more meds), cold feet, hot & sweaty, thirsty, right knee pain, stepped on a hornets nest w/ 12 stings and reaction; she also reported an episode of syncope [place] she was taken to daughter's doctor & reports that everything checked out OK- we do not have the data... She remains very active & plays "pickle ball" everyday.  CT Abd 10/01/14>  Severe diverticulosis & mild acute sigm diverticulitis + inflammed distal rectum/anus c/w acute proctitis; 127msolitary cystic lesion in body of pancreas, gallstones and atherosclerotic changes in Ao...   ~  May 06, 2015:  59m70moV & add-on appt requested by pt due to right knee pain and LBP for the last 1wk she says; no known trauma or injury, and she denies prev problems x occas soreness; she thinks that she  may have had a shot in her knee once several yrs ago; as for the back pain- it is dull, in center of back w/o radiation...     EXAM shows Afeb, VSS, O2sat=97%;  HEENT- neg, mallampati1;  Chest- clear w/o w/r/r;  Heart- RR w/o m/r/g;  Abd- soft, nontender, neg;  Ext- +crepitus in right knee, w/o c/c/e;  Neuro- intact...   XRay right knee 05/06/15> osteoarthtitis w/ medial joint space compartment narrowing with milder patellofemoral joint space compartment narrowing. Marginal osteophytes project from all 3 compartments, mild spurring along the tibial spine, no joint effusion; Bones are demineralized...  XRay LS spine 05/06/15>  Disc space narrowing at L2-3 with endplate sclerosis. Mild osteophytic  changes are seen. Diffuse aortic calcifications are noted. NAD... IMP/PLAN>>  [name] is c/o 1 wk hx right knee & LBP; exam shows good ROM right knee w/ crepitation; XRays indicate degen arthritis; we discussed Rx w/ Tramadol+Tylenol, neoprene sleeve for knee and rest/ heat for back; I indicated next step is to refer to Ortho for shot if symptoms don't respond to Rx...   ~  August 11, 2015:  3moROV & Glena is stable- c/o runny nose, LBP, shoulder pain, right knee pain; she has tried Tramadol w/o much benefit & we discussed adding topical heat, ParafonTid 7 refer to Ortho for further eval... We reviewed the following medical problems during today's office visit >>     Macular Degen> followed by DrHecker & DrRankin; she received shots MD & had bilat cat surg but we do not have notes from them...    Hearing Loss> she saw DrGore, ENT w/ audiology showing bilat SNHL...    HBP> on ASA81, Valsartan160, Verap240, HCT25; BP= 142/68 & she denies HA, CP, palpit, dizzy, SOB, edema, etc...    Hyperlipid> on Simva20; FLP 2/17 show TChol 155, TG 71, HDL 60, LDL 80; continue same meds...    Hx Impaired FBS> Hx BS in the 130 range prev, but all readings ~100 x yrs now;  Labs 2/17 showed BS=94    Hypothy> hx multinod thyroid x yrs; had thyroidectomy 1990 by DrWeatherly; she weaned off Thyroid hormone yrs ago & has remained clinically & biochem euthyroid; Labs 2/17 showed TSH= 0.68    GI- Divertics, Polyps> hx diverticulitis in 2008- followed by DrPerry; last colonoscopy 5/16 showed severe diverticulosis w/o inflamm or polyps seen...    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 Labs 2/17 showed Hg= 14.6 & prev LFTs are WNL.    DJD, Osteoporosis> on Ca, MVI, Glucosamine; she uses OTC analgesic meds as needed; see BMDs below- on Bisphos drug holiday since 7/11 & due for f/u BMD... Labs 2/17 w/ VitD= 44    Memory Loss> eval by  [facility] 4/14, DrPenumalli; felt to have mild cognitive impairment vs normal aging, then w/ progressive impairment & they have her on Donepezil10 & Namenda10Bid now... EXAM shows Afeb, VSS, O2sat=98%;  HEENT- neg, mallampati1;  Chest- clear w/o w/r/r;  Heart- RR w/o m/r/g;  Abd- soft, nontender, neg;  Ext- +crepitus in right knee, w/o c/c/e;  Neuro- intact...   LABS 08/11/15>  FLP- all parameters at goals on Simva20;  Chems- wnl;  CBC- wnl;  TSH=0.68;  VitD=44...  IMP/PLAN>>  [name] is stable- we discussed trying heat, Tramadol, Parafon for her arthritic & LBP complaints; we will refer to Ortho; rec routine ROV in 639mo.   ~  February 09, 2016:  9m71mo  ROV & [name] is stable overall but persists w/ mult somatic complaints like her runny nose (Astelin rx- offered alternative but she does not want new meds); she still has LBP, shoulder pain, knee pain- on Parafon, Tramadol, & heat but she did not go to Ortho for further eval as rec last visit; she is also c/o difficulty hearing & has hearing aides [facility] but says it isn't right- offered 2nd opinion from ENT & she will let me know;  She also c/o noct leg cramps- we reviewed the usual recs for tonic water vs yellow mustard;  Finally she is also c/o visual difficulties- she had cat surg by DrHecker, and is followed by retina specialist DrRankin w/ macular degen s/p shots...    Macular Degen> followed by DrHecker & DrRankin; she received shots for MD & had bilat cat surg but we do not have notes from them...    Hearing Loss> she saw DrGore, ENT w/ audiology showing bilat SNHL, she has bilat hearing aides but notes that they are not right...    HBP> on ASA81, Valsartan160, Verap240, HCT25; BP= 132/70 & she denies HA, CP, palpit, dizzy, SOB, edema, etc...    Hyperlipid> on Simva20; FLP 2/17 show TChol 155, TG 71, HDL 60, LDL 80; continue same meds...    Hx Impaired FBS> Hx BS in the 130 range prev, but all readings ~100 x yrs now on diet alone;  Labs 2/17  showed BS=94    Hypothy> hx multinod thyroid x yrs; had thyroidectomy 1990 by DrWeatherly; she weaned off Thyroid hormone yrs ago & has remained clinically & biochem euthyroid; Labs 2/17 showed TSH= 0.68    GI- Divertics, Polyps> hx diverticulitis in 2008- followed by DrPerry; last CT Abd 09/2014 w/ severe diverticulosis & mild acute sigmoid diverticulitis- treated w/ Cipro/Flagyl & resolved; f/u colonoscopy 5/16 showed severe diverticulosis w/o inflamm or polyps seen, advised Miralax etc...    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 Labs 2/17 showed Hg= 14.6 & prev LFTs are WNL.    GYN- DrRichardson    DJD, Osteoporosis> on Ca, MVI, Glucosamine; she uses OTC analgesic meds as needed; see BMDs below- on Bisphos drug holiday since 7/11 & due for f/u BMD... Labs 2/17 w/ VitD= 44    Memory Loss> eval by [facility] 4/14, DrPenumalli & recent f/u 12/2015 (MMSE=26) felt to have mild cognitive impairment vs normal aging, then w/ progressive impairment & they have her on Donepezil10 & Namenda10Bid.  EXAM shows Afeb, VSS, O2sat=98%;  HEENT- neg, mallampati1;  Chest- clear w/o w/r/r;  Heart- RR w/o m/r/g;  Abd- soft, nontender, neg;  Ext- +crepitus in right knee, w/o c/c/e;  Neuro- early dementia, no focal neuro changes...  IMP/PLAN>>  Gina has mult somatic complaints and we discussed each (again) & I wonder about relation to her dementia; she will continue current meds and we reviewed tonic water vs yellow mustard for the noct leg cramps & wrote this down on her AVS; we plan ROV in 24mow/ f/u CXR, EKG, Fasting labs... Note: >50% of this 270m visit was spent in counseling & coordination of care...  ~  August 14, 2016:  32m20moV & Mrs RetBerdine Addisons seen in the ER 06/29/16 with palpitations> EKG showed Afib w/ rvr 138, then resolved spont in ER to NSR, Exam was otherw unremarkable, CHADsVASC score=5; CXR- NAD and LABS- ok...  She was placed on Eliquis2.5Bid & Propranolol  37m prn palpit & asked to f/u w/ CARDS;  She had f/u appt here w/ PA- Tammy Parrett on 07/14/16> pt reported doing well- no recurrent palpit, no CP/ SOB/ etc, doing well on Eliquis w/o bleeding/ bruising/ etc; f/u EKG showed holding SBrady, rate 54 => she has a CARDS appt w/ DrNelson 08/24/16...    Her CC= constant right knee pain => Rx w/ OTC Advil/ Aleve / Tylenol prn & we will refer to Ortho for further eval... EXAM shows Afeb, VSS, O2sat=98%;  HEENT- neg, mallampati1;  Chest- clear w/o w/r/r;  Heart- RR w/o m/r/g;  Abd- soft, nontender, neg;  Ext- +crepitus in right knee, w/o c/c/e;  Neuro- early dementia, no focal neuro changes...   CXR 06/29/16>  Norm heart size, clear lungs- NAD, thoracic kyphosis & spondylosis...   EKG 06/29/16>  AFlutter w/ variable conduction, HR=137;  Converted spont to NSR rate 67, EKG otherw wnl...  LABS 06/2016 in Epic>  CBC/ Chems- reviewed & ok;  TSH=0.45 IMP/PLAN>>  [name] had episode of AFlutter w/ rvr & converted spont to NSR, started on Eliquis2.5Bid for ChadsVasc scaore of 5; she has yet to see cards but appt w/ DrNelson set for 08/24/16... Her CC is right knee pain & we will send to Ortho for assessmant...  ADDENDUM>>  She saw DrNelson 08/24/16 HBP, HL, palpit=> episode of PAF w/ rvr 06/2016, spont coverted to NSR in ER w/o intervention, started on Eliquis2.5Bid, no recurrent palpit since then; Eliquis was too expensive & she was switched to Coumadin;  EKG showed SBrady, otherw wnl;  2DEcho performed 09/11/16=> mod focal basilar hypertrophy, norm sys function w/ EF=60-65%, no regional wall motion abn, Gr1DD, AoV mildly thickened calcif leaflets- no AS, mild AI; norm MV, normal LA at 365m norm RV, PAsys=33...  ~  Nov 20, 2016:  15m39moV & add-on appt requested for abd discomfort> She called last wk c/o 2wk hx abd discomfort, mid abd area, not feeling well, but denied n/v/d/ blood seen & no f/c/s etc; she declined to be  seen in ER/ urgent care & set up this appt to see me today;  She is followed for GI by DrPerry w/ hx severe diverticulosis on her last colon 11/2014 and a bout of clinical diverticulitis in 2008 and CT Abd in 09/2014 w/ severe divertics and mild acute sigm diverticulitis treated w/ Flagyl/ Cipro;  SHE FEELS THAT HER SYMPTOMS HAVE IMPROVED OVER THE LAST WEEK ON GLUTEN FREE DIET-- and she tells us Korear the 1st time that several relatives have Celiac disease (her grandmother, her brother, and her son) => we agreed to check Tissue Transglutaminase Antibody test (=> NEG) but her symptoms sound more like IBS-c & we will treat w/ Miralax as rec by DrPerry in the past, plus Bentyl 51m77md prn for abd discomfort...     She is otherw stable on Coumadin for hx PAF, BP controlled on Verap240, Diovan160, HCT25 and electrolytes are wnl...     She remains on Aricept & Namenda and pt & her husb are doing as well as can be expected given their advanced age & underlying issues... EXAM shows Afeb, VSS, O2sat=95%;  HEENT- neg, mallampati1;  Chest- clear w/o w/r/r;  Heart- RR w/o m/r/g;  Abd- soft, nontender, neg;  Ext- +crepitus in right knee, w/o c/c/e;  Neuro- early dementia, no focal neuro changes...   LABS 11/20/16>  Chems- ok w/ BS=116, Cr=0.92, LFTs- wnl;  CBC- wnl w/ Hg=14.5, wbc=9.9;  Sed rate=6;  Tissue Transglutaminase Ab=  NEG...  IMP/PLAN>>  [name] has known severe diverticulosis, hx hyperplastic polyps, hx hems;  Her symptoms sound like mild IBS & DrPerry prev rec Miralax to her but she is not using any of this;  Rec to take Miralax daily & adjust amt to keep stools soft & easy to pass w/o straining;  Plus Bentyl 67m Qid as needed for abd discomfort & cramps...   ~  February 12, 2017:  338moOV & general medical follow up visit>  [name] returns feeling well, prev abdominal issues resolved & have not returned, she has stopped the gluten-free diet & back to eating what she wants;  [facility]. We reviewed the following medical problems during today's office visit >>     Macular Degen> followed by DrHecker & DrRankin; she received shots for MD & had bilat cat surg & Glaucoma suspect; [facility]...    Hearing Loss> she saw DrGore, ENT w/ audiology showing bilat SNHL, she has bilat hearing aides but notes that they are not right...    HBP> on ASA81, Valsartan160, Verap240, HCT25; BP= 140/80 by me today & she denies HA, CP, palpit, dizzy, SOB, edema, etc; she [facility] are [facility] ~120...    Hyperlipid> on Simva20; FLP 8/18 shows TChol 184, TG 157, HDL 52, LDL 100; continue same meds, stress compliance + better diet...    Hx Impaired FBS> Hx BS in the 130 range prev, but all readings ~100 x yrs now on diet alone;  Labs 8/18 showed BS=106, A1c=6.4    Hypothy> hx multinod thyroid x yrs; had thyroidectomy 1990 by DrWeatherly; she weaned off Thyroid hormone yrs ago & has remained clinically & biochem euthyroid; Labs 8/18 showed TSH= 0.65    GI- Divertics, Polyps> hx diverticulitis in 2008- followed by DrPerry; last CT Abd 09/2014 w/ severe diverticulosis & mild acute sigmoid diverticulitis- treated w/ Cipro/Flagyl & resolved; f/u colonoscopy 5/16 showed severe diverticulosis w/o inflamm or polyps seen, advised Miralax etc...    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 We checked her Tissue Transglutaminase Abs-IgG&IgA (both of which were NEG)...  Labs 2/17 showed Hg= 14.6 & prev LFTs are WNL.    GYN- DrRichardson    DJD, Osteoporosis> on Ca, MVI, Glucosamine; she uses OTC analgesic meds as needed; see prev BMDs below- Tscore -3.2 in Spine 2001 & started on Fosamax w/ improved scores on Rx; on Bisphos drug holiday since 7/11 & f/u BMD 08/2013 showed normal Tscores-  she remains on Ca, MVI, VitD supplements...    Memory Loss> eval by [facility] 4/14, DrPenumalli & neuro f/u 12/2015 (MMSE=26) felt to have mild cognitive impairment vs normal aging, then w/ progressive impairment & they have her on Donepezil10 & Namenda10Bid.  EXAM shows Afeb, VSS, O2sat=96%;  HEENT- neg, mallampati1;  Chest- clear w/o w/r/r;  Heart- RR w/o m/r/g;  Abd- soft, nontender, neg;  Ext- +crepitus in right knee, w/o c/c/e;  Neuro- early dementia, no focal neuro changes...   LABS 02/13/17 shows>  FLP- ok but reminded to take med every day;  Chems- ok w/ BS=106, A1c=6.4;  CBC- wnl w/ Hg=14.4;  TSH=0.65 IMP/PLAN>>  [name] feels that she is doing well- no new complaints or concerns;  She requests refill Rxs  for 90d supplies;  She will ret for FASTING blood work (labs ok) & continue her protime checks in the coumadin clinic... We plan rov in 5mo soooner if needed for problems...            Problem List:  MACULAR DEGENERATION (ICD-362.50) - eval by DrHecker w/ foveal telangiectasia, macular drusen, macular degeneration, and cataracts- being followed regularly... vision is preserved so far... ~  11/11:  f/u DrHecker- stable age related mac degen, no retinopathy. ~  11/12:  f/u DrHecker- has advanced bilat AMD, sent to DGibson we don't have his notes... ~  She has been getting shots in the eyes for macular degen, we do not have notes from DFillmore.. ~  2/16: she tells me that she continues to receive the shots; she is also sched for bilat cat surg by DrHecker... ~  She continues to f/u w/ DrHecker- s/p bilat cats, glaucoma suspect, AMD; last note 12/17 reviewed;  She also sees DrRankin but we do not have notes from him...  HYPERTENSION (ICD-401.9) >>  ~  on VERAPAMIL 2470md,  TOPROL-XL 2571m,  HCTZ 63m16m.. Prev on Lisinopril but this was stopped due to throat clearing tic & mild cough, plus K=6.0 at the time (all improved off the ACE)... ~  8/10: borderline BP here and she will  monitor at home carefully & call if not <150/90. ~  7/11: she reports that she incr the Lisinopril to 1&1/2 tabs due to BP~150+, now better... ~  7/12:  BP stable on Verap240 & Lisinopril 30mg75mBP= 140/68 today> denies HA, fatigue, visual changes, CP, palipit, dizziness, syncope, dyspnea, edema, etc... ~  1/13:  BP= 160/90 (didn't take med today); states BP are ok at home; continue same meds + low sodium etc... ~  CXR 1/13 showed norm heart size, clear lungs, mild thor spondylosis, NAD... ~ [name]7/13:  BP= 152/82 & she notes some troublesome throat clearing & mild cough; we decided to STOP the ACE & switch to LOSARTAN 100mg/19monitor her symptoms and her blood presure... ~  CXR 9/13 showed calcif Ao, clear lungs, suspect left thyroid enlargement, DJD in Tspine... ~  EKG 10/13 showed SBrady, rate58, wnl, NAD... ~  11/13: she had long convoluted hx of BP med changes- see above- now on Verap240, MetopER25, HCT25, & BP= 126/72; we discussed options but she is content to continue these same meds for now & watch BP... ~  3/14: on ASA81, Metop25, Verap240, HCT25; BP= 140/80 & she denies HA, CP, palpit, dizzy, SOB, edema, etc. ~  1/15: on ASA81, Metop25, Verap240, HCT25; BP= 140/82 & she remains essen asymptomatic; she is bradycardic & we decided to stop ToprolXL & replace it w/ [id number] 1/15 showed heart at upper lim of norm, mild dev of trach to right, clear lungs, NAD...  ~  EKG 1/15 showed SBrady, rate44, NSSTTWA, NAD... ~  [name]/15: on ASA81, Diovan160, Verap240, HCT25; BP= 120/60 & she is essentially asymptomatic, pulse=60, continue same meds... ~  2/16: on same meds & BP= 128/76, she remains asymptomatic... ~  BP controlled on Diovan160, Verapamil240, HCT25; they monitor BP at home & at the [facility]...  PAF >> she had bout of palpit & went to the ER 06/2016 found to be in AFib w/ rvr => spont converted to NSR w/o known recurrence; started on Eliquis2.5Bid & set up to see CARDS. ~  08/2016> she was eval  by DrNelson-- changed to Coumadin due to $$ issues and 2DEcho showed mod focal  basilar hypertrophy, norm sys function w/ EF=60-65%, no regional wall motion abn, Gr1DD, AoV mildly thickened calcif leaflets- no AS, mild AI; norm MV, normal LA at 70m, norm RV, PAsys=33...  DYSLIPIDEMIA (ICD-272.4) - on ZOCOR 210md... plus doing well on diet... ~  FLHatfield/09 showed TChol 144, TG 140, HDL 39, LDL 77... continue same. ~  FLP 2/10 showed TChol 164, TG 77, HDL 54, LDL 95 ~  FLP 8/10 showed TChol 138, TG 51, HDL 48, LDL 80 ~  FLP 1/11 showed TChol 155, TG 100, HDL 48, LDL 87 ~  FLP 7/11 showed TChol 158, TG 74, HDL 55, LDL 89 ~  FLP 1/12 showed TChol 155, TG 101, HDL 50, LDL 85 ~  FLP 1/13 on Simva20 showed TChol 169, TG 118, HDL 56, LDL 89  ~  FLP 1/15 on Simva20 showed TChol 155, TG 122, HDL 50, LDL 81 ~  FLP 2/16 on Simva20 showed TChol 161, TG 107, HDL 61, LDL 79; continue same meds. ~  She wanted off Simvastatin & was switched to Atorva10 ~  FLFranklin/18 on Atorva10 showed   Hx Impaired Fasting Glucose >> on diet alone... FBS @ home betw 120-130 recently... she is on a good diet and weight is down at 139# today (63"  BMI= 24)... ~  labs 3/09 showed FBS= 119... subseq HgA1c 5/09 was 6.4... diet Rx. ~  labs 8/09 showed BS= 106, HgA1c= 5.9...Marland KitchenMarland Kitchenec- same. ~  labs 2/10 showed BS= 102, A1c= 5.9 ~  labs 1/11 showed BS= 98 ~  labs 1/12 showed BS= 95 ~  Labs 1/13 showed BS= 101 ~  Labs 3/14 showed BS=106, A1c=6.2 ~  Labs 1/15 showed BS= 103 ~  Labs 2/16 showed FBS= 99  HYPOTHYROIDISM (ICD-244.9) - off Synthroid now...  clinically & biochemically euthyroid... [place] she had an eval by DrKohut in 1987 w/ familial multinodular goiter w/ neg FNA... started on Rx but goiter enlarged- had thyroidectomy 1990 by DrWeatherly... ~  labs 3/09 showed TSH = 0.35 (0.35-5.5)... we have slowly decr her dose to 5074md... ~  labs 8/09 showed TSH=  0.33... rec- decr the Synth50 to 1/2 tab daily. ~  labs 2/10 showed =TSH= 0.54 and Synthroid stopped... ~  labs 4/10 off med showed TSH= 0.59 ~  labs 8/10 showed TSH= 0.60 ~  labs 1/11 showed TSH= 0.60 ~  labs 7/11 showed TSH= 0.56 ~  labs 1/12 showed TSH= 0.60 ~  Labs 1/13 showed TSH= 0.58 ~  Labs 3/14 showed TSH= 0.37 ~  Labs 1/15 showed TSH= 0.59 ~  Labs 2/16 showed TSH= 0.76  DIVERTICULOSIS OF COLON (ICD-562.10) COLONIC POLYPS (ICD-211.3) - followed by DrPerry... bout of diverticulitis in Mar08... ~  last colonoscopy 3/08 showed divertics, 2mm68mlyp, hems... path=hyperplastic, f/u planned 6yrs78yr F/u colonoscopy 4/13 by DrPerry showed severe diverticulosis w/o inflamm and w/o polyps etc... ~  2016:  CT Abd 09/2014 w/ severe diverticulosis & mild acute sigmoid diverticulitis- treated w/ Cipro/Flagyl & resolved; f/u colonoscopy 5/16 showed severe diverticulosis w/o inflamm or polyps seen, advised Miralax etc...  NOTE>> 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  HEMATURIA, HX OF (ICD-V13.09) - eval 4/08 by DrGrapey w/ neg Sonar, Cysto, cytology...  GYN >> DrKRichardson, Mammograms at  [facility] (NEG- 4/15 study)...  DEGENERATIVE JOINT DISEASE (ICD-715.90) - c/o discomfort in her shoulders and knees... uses OTC meds as needed. ~  04/2015:  She presented w/ 1wk hx right knee & LBP; exam showed crepitation in right knee but good ROM & SLR was neg; Rec to use Tramadol+Tylenol, neoprene support sleeve for knee & rest/heat/ exercises for back; we will refer to Ortho for shots if not responding...   Hx of OSTEOPOROSIS (ICD-733.00) - on Fosamax 70/wk thru 7/11, Caltrate 1/d, MVI, Vit D 1000/d... ~  initial BMD 10/01 showed TScores -0.2  to -3.2 in the spine...  ~  f/u studies w/ steady improvement on  Fosamax therapy... ~  BMD 3/09 w/ TScores -0.1 to -1.7 in the spine... continue Rx. ~  BMD 7/11 showed TScores -2.0 Spine, and -0.3 in right FemNeck... we opted for Bisphos drug holiday to start now. ~  Labs 1/13 showed Vit D level = 48, continue same... ~  BMD 2/15 showed WNL Tscores; rec to continue calcium, MVI, VitD, wt bearing exercise...  PROGRESSIVE MEMORY LOSS >>  ~  MMSE 5/09 by Doran Heater, NP was 29/30... Reassured. ~  3/14: she presented w/ c/o worsening memory & MMSE has diminished- refer to [facility] for eval & rx... ~  Neuro eval 4/14 by DrPenumalli> mild cognitive impairment vs norm aging, he did not rec starting meds... ~  CT Brain 4/14 showed mild atrophy & sm vessel dis ~  She continues to f/u w/ Neurology periodically... ~  1/15: ROV here & husb more concerned re her memory, recall was 0/3 after distraction; husb has apparently done well on Aricept & she wants trial, start ARICEPT5=>10 daily... ~  She has been followed by DrPenumalli & they added Namenda 10Bid to her Aricept10...  ANXIETY (ICD-300.00) - prev on Alpraz Prn but hasn't used in yrs...  DERMATOLOGY >> LIPOMA (ICD-214.9) - notes large lipoma in RLQ/ rt flank area... some discomfort noted... seen by DrLeone in the past w/o surg... she saw DrCornett, CCS, 1/11 for second opinion & she will decide re: excision... ~  5/13:  She had Basal cell ca removed from the left side of her nose, w/ subseq deeper margins & reports everything is OK... ~  7/13:  She is concerned that the lipoma may be growing & she requests f/u DrCornett for excision... ~  10/13:  Lipoma removed by DrCornett in outpt surg 04/17/12...  SHINGLES - remote hx of right T8-9 shingles in 1986...   Past Surgical History:  Procedure Laterality Date  . APPENDECTOMY  1947  . COLONOSCOPY    . MASS EXCISION  04/17/2012   Procedure: EXCISION MASS;  Surgeon: [name], MD;  Location: [facility];  Service: General;  Laterality: N/A;   . THYROIDECTOMY  1990   [name]    Outpatient Encounter Prescriptions as of 02/12/2017  Medication Sig  . atorvastatin (LIPITOR) 10 MG tablet Take 1 tablet (10 mg total) by mouth daily.  . Calcium Carbonate-Vitamin D (CALCIUM 600+D) 600-400 MG-UNIT per tablet Take 1 tablet by mouth daily.  . Cyanocobalamin (VITAMIN B 12 PO) Take 1 tablet by mouth daily. Reported on 12/27/2015  . dicyclomine (BENTYL) 10 MG capsule Take 1 capsule (10 mg total) by mouth 4 (four) times daily -  before meals and at bedtime.  . donepezil (ARICEPT) 10 MG tablet Take 1 tablet (10 mg total) by mouth at bedtime.  . fish oil-omega-3 fatty acids 1000 MG capsule Take 1 g by  mouth daily.  [name] glucosamine-chondroitin 500-400 MG tablet Take 1 tablet by mouth 3 (three) times daily.  . hydrochlorothiazide (HYDRODIURIL) 25 MG tablet Take 1 tablet (25 mg total) by mouth daily.  . memantine (NAMENDA) 10 MG tablet Take 1 tablet (10 mg total) by mouth 2 (two) times daily.  . Multiple Vitamins-Minerals (MULTIVITAMIN & MINERAL PO) Take 1 tablet by mouth daily.  . valsartan (DIOVAN) 160 MG tablet TAKE 1 TABLET (160 MG TOTAL) BY MOUTH DAILY.  . verapamil (CALAN-SR) 240 MG CR tablet Take 1 tablet (240 mg total) by mouth at bedtime.  [name] warfarin (COUMADIN) 5 MG tablet Take as directed by coumadin clinic  . [DISCONTINUED] atorvastatin (LIPITOR) 10 MG tablet Take 1 tablet (10 mg total) by mouth daily.  . [DISCONTINUED] dicyclomine (BENTYL) 10 MG capsule Take 1 capsule (10 mg total) by mouth 4 (four) times daily -  before meals and at bedtime.  . [DISCONTINUED] hydrochlorothiazide (HYDRODIURIL) 25 MG tablet TAKE 1 TABLET (25 MG TOTAL) BY MOUTH DAILY.  . [DISCONTINUED] valsartan (DIOVAN) 160 MG tablet TAKE 1 TABLET (160 MG TOTAL) BY MOUTH DAILY.  . [DISCONTINUED] verapamil (CALAN-SR) 240 MG CR tablet Take 1 tablet (240 mg total) by mouth at bedtime.   No facility-administered encounter medications on file as of 02/12/2017.     Allergies   Allergen Reactions  . Epinephrine Palpitations    REACTION: heart racing    Immunization History  Administered Date(s) Administered  . DTP 04/17/2000  . Influenza Split 05/20/2011, 05/15/2012  . Influenza Whole 05/02/2010  . Influenza, High Dose Seasonal PF 05/08/2013  . Influenza,inj,Quad PF,36+ Mos 05/25/2014, 06/29/2015, 06/13/2016  . Pneumococcal Polysaccharide-23 04/17/2000  . Tdap 08/04/2013    Current Medications, Allergies, Past Medical History, Past Surgical History, Family History, and Social History were reviewed in [employer] record.    Review of Systems        See HPI - all other systems neg except as noted...  The patient complains of dyspnea on exertion.  The patient denies anorexia, fever, weight loss, weight gain, vision loss, decreased hearing, hoarseness, chest pain, syncope, peripheral edema, prolonged cough, headaches, hemoptysis, abdominal pain, melena, hematochezia, severe indigestion/heartburn, hematuria, incontinence, muscle weakness, suspicious skin lesions, transient blindness, difficulty walking, depression, unusual weight change, abnormal bleeding, enlarged lymph nodes, and angioedema.   Objective:   Physical Exam    WD, WN, 83 y/o WF in NAD... VITAL SIGNS:  Reviewed... GENERAL:  Alert & oriented; pleasant & cooperative... HEENT:  [place]/AT, EOM-wnl, PERRLA, EACs-clear, TMs-wnl, NOSE-clear, THROAT-clear & wnl. NECK:  Supple w/ fair ROM; no JVD; normal carotid impulses w/o bruits; scar of prev thyroid surg- no nodules or goiter; no lymphadenopathy. CHEST:  Clear to P & A; without wheezes/ rales/ or rhonchi. HEART:  Regular Rhythm; without murmurs/ rubs/ or gallops. ABDOMEN:  Soft & nontender; normal bowel sounds; no organomegaly or masses detected. EXT: without deformities, mild arthritic changes; no varicose veins/ venous insuffic/ or edema. prob heel spurs w/ some plantar fascia pain> advised soaks & stretching... NEURO:  CN's  intact;  no focal neuro deficits... DERM:  Scar in right flank after lipoma excision... no other lesions noted...  MMSE 09/17/12>  She did not know the Pres ("I can see him, a black guy"), VP, or Gov;  Didn't know prev pres etc;  Recall 3/3 immed, but only 1/3 after distraction;  Serial 7s & WORLD backwards were fluent; proverbs were abstracted normally...  RADIOLOGY DATA:  Reviewed in the EPIC EMR &  discussed w/ the patient...  LABORATORY DATA:  Reviewed in the EPIC EMR & discussed w/ the patient...   Assessment & Plan:    02/12/17>   [name] feels that she is doing well- no new complaints or concerns;  She requests refill Rxs for 90d supplies;  She will ret for FASTING blood work & continue her protime checks in the coumadin clinic... We plan rov in 65mo [place] if needed for problems.   Macular Degen>  Followed by DrHecker/ [facility] & getting MD shots...  HBP>  on Verap, Diovan, HCT- along w/ no salt; & BP appears to be well controlled & pulse improved (60/min)  ATRIAL FLUTTER EPISODE 06/2016 => spont converted, started on Eliquis2.5Bid & set up to see CARDS...  DYSLIPIDEMIA>  Stable on diet & Simva20...  Borderline DM>  Hx BS in the 120-130 range yrs ago but stable ~100 for the last 3+yrs on diet alone...  Hx Hypothyroidism>  She weaned off Synthroid several yrs ago & TSH has remained normal, clinically euthyroid as well...  GI>  Divertics, Colon Polyps>  Stable & up to date w/ f/u colon 2016 from DrPerry==> divertics, no polyps...  LIPOMA>  Removed 10/13 from RLQ area by DrCornett...  DJD>  She notes some right knee discomfort playing "pickle ball" & is advised to wear knee brace, take Aleve prn (she declines further eval or prescription meds)... 10/16>  1 wk hx right knee & LBP; exam shows good ROM right knee w/ crepitation; XRays indicate degen arthritis; we discussed Rx w/ Tramadol+Tylenol, neoprene sleeve for knee and rest/ heat for back; I indicated next step is to refer to  Ortho for shot if symptoms don't respond to Rx.  Osteoporosis>  Hx BMD in 2001 w/ TScore =3.2 in Spine; Rx'd Fosamax for 131yrw/ steady improvement & on Bisphos drug holiday since 7/11 & BMD is 2015 was WNL- continue supplements w/ Calcium, MVI, Vit D...  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  MEMORY LOSS>  Followed by for mild dementia w/ MMSE 26/30- DrPenumalli on Aricept & Namenda...  Other medical problems as listed...   Patient's Medications  New Prescriptions   No medications on file  Previous Medications   CALCIUM CARBONATE-VITAMIN D (CALCIUM 600+D) 600-400 MG-UNIT PER TABLET    Take 1 tablet by mouth daily.   CYANOCOBALAMIN (VITAMIN B 12 PO)    Take 1 tablet by mouth daily. Reported on 12/27/2015   DONEPEZIL (ARICEPT) 10 MG TABLET    Take 1 tablet (10 mg total) by mouth at bedtime.   FISH OIL-OMEGA-3 FATTY ACIDS 1000 MG CAPSULE    Take 1 g by mouth daily.   GLUCOSAMINE-CHONDROITIN 500-400 MG TABLET    Take 1 tablet by mouth 3 (three) times daily.   MEMANTINE (NAMENDA) 10 MG TABLET    Take 1 tablet (10 mg total) by mouth 2 (two) times daily.   MULTIPLE VITAMINS-MINERALS (MULTIVITAMIN & MINERAL PO)    Take 1 tablet by mouth daily.   WARFARIN (COUMADIN) 5 MG TABLET    Take as directed by coumadin clinic  Modified Medications   Modified Medication Previous Medication   ATORVASTATIN (LIPITOR) 10 MG TABLET atorvastatin (LIPITOR) 10 MG tablet      Take 1 tablet (10 mg total) by mouth daily.    Take 1 tablet (10 mg total) by mouth daily.   DICYCLOMINE (BENTYL) 10 MG CAPSULE dicyclomine (BENTYL) 10 MG capsule  Take 1 capsule (10 mg total) by mouth 4 (four) times daily -  before meals and at bedtime.    Take 1 capsule (10 mg total) by mouth 4 (four) times daily -  before meals and at bedtime.   HYDROCHLOROTHIAZIDE (HYDRODIURIL) 25 MG TABLET hydrochlorothiazide (HYDRODIURIL) 25 MG tablet       Take 1 tablet (25 mg total) by mouth daily.    TAKE 1 TABLET (25 MG TOTAL) BY MOUTH DAILY.   VALSARTAN (DIOVAN) 160 MG TABLET valsartan (DIOVAN) 160 MG tablet      TAKE 1 TABLET (160 MG TOTAL) BY MOUTH DAILY.    TAKE 1 TABLET (160 MG TOTAL) BY MOUTH DAILY.   VERAPAMIL (CALAN-SR) 240 MG CR TABLET verapamil (CALAN-SR) 240 MG CR tablet      Take 1 tablet (240 mg total) by mouth at bedtime.    Take 1 tablet (240 mg total) by mouth at bedtime.  Discontinued Medications   No medications on file

## 2017-02-12 NOTE — Patient Instructions (Signed)
Today we updated your med list in our EPIC system...    Continue your current medications the same...  We will refill your meds for 90d supplies as you requested...  Please return to our lab one morning this week for your f/u FASTING blood work...    We will contact you w/ the results when available...   Keep up the good work w/ your [employer]...  Also add-in some puzzles & mental exercise to aide your memory...  Call for any questions...  Let's plan a follow up visit in 71mo, sooner if needed for problems.Marland KitchenMarland Kitchen

## 2017-02-13 ENCOUNTER — Other Ambulatory Visit (INDEPENDENT_AMBULATORY_CARE_PROVIDER_SITE_OTHER): Payer: Medicare Other

## 2017-02-13 DIAGNOSIS — R7301 Impaired fasting glucose: Secondary | ICD-10-CM | POA: Diagnosis not present

## 2017-02-13 DIAGNOSIS — I10 Essential (primary) hypertension: Secondary | ICD-10-CM

## 2017-02-13 DIAGNOSIS — E039 Hypothyroidism, unspecified: Secondary | ICD-10-CM

## 2017-02-13 DIAGNOSIS — I480 Paroxysmal atrial fibrillation: Secondary | ICD-10-CM

## 2017-02-13 LAB — BASIC METABOLIC PANEL
BUN: 17 mg/dL (ref 6–23)
CALCIUM: 9 mg/dL (ref 8.4–10.5)
CO2: 30 mEq/L (ref 19–32)
Chloride: 102 mEq/L (ref 96–112)
Creatinine, Ser: 0.86 mg/dL (ref 0.40–1.20)
GFR: 67.09 mL/min (ref >60.00)
Glucose, Bld: 106 mg/dL — ABNORMAL HIGH (ref 70–99)
Potassium: 4.1 mEq/L (ref 3.5–5.1)
SODIUM: 141 meq/L (ref 135–145)

## 2017-02-13 LAB — CBC WITH DIFFERENTIAL/PLATELET
BASOS ABS: 0.1 10*3/uL (ref 0.0–0.1)
BASOS PCT: 1.1 % (ref 0.0–3.0)
Eosinophils Absolute: 0.2 10*3/uL (ref 0.0–0.7)
Eosinophils Relative: 2.4 % (ref 0.0–5.0)
HEMATOCRIT: 43.1 % (ref 36.0–46.0)
HEMOGLOBIN: 14.4 g/dL (ref 12.0–15.0)
Lymphocytes Relative: 32.7 % (ref 12.0–46.0)
Lymphs Abs: 2.9 10*3/uL (ref 0.7–4.0)
MCHC: 33.5 g/dL (ref 30.0–36.0)
MCV: 92.1 fl (ref 78.0–100.0)
MONOS PCT: 9.2 % (ref 3.0–12.0)
Monocytes Absolute: 0.8 10*3/uL (ref 0.1–1.0)
NEUTROS ABS: 4.8 10*3/uL (ref 1.4–7.7)
Neutrophils Relative %: 54.6 % (ref 43.0–77.0)
PLATELETS: 254 10*3/uL (ref 150.0–400.0)
RBC: 4.69 Mil/uL (ref 3.87–5.11)
RDW: 13.2 % (ref 11.5–15.5)
WBC: 8.9 10*3/uL (ref 4.0–10.5)

## 2017-02-13 LAB — LIPID PANEL
Cholesterol: 184 mg/dL (ref 0–200)
HDL: 52.4 mg/dL (ref >39.00)
LDL Cholesterol: 100 mg/dL — ABNORMAL HIGH (ref 0–99)
NonHDL: 131.88
TRIGLYCERIDES: 157 mg/dL — AB (ref 0.0–149.0)
Total CHOL/HDL Ratio: 4
VLDL: 31.4 mg/dL (ref 0.0–40.0)

## 2017-02-13 LAB — TSH: TSH: 0.65 u[IU]/mL (ref 0.35–4.50)

## 2017-02-13 LAB — HEMOGLOBIN A1C: Hgb A1c MFr Bld: 6.4 % (ref 4.6–6.5)

## 2017-02-15 ENCOUNTER — Other Ambulatory Visit: Payer: Self-pay | Admitting: Diagnostic Neuroimaging

## 2017-02-19 ENCOUNTER — Telehealth: Payer: Self-pay | Admitting: Pulmonary Disease

## 2017-02-19 MED ORDER — LOSARTAN POTASSIUM 100 MG PO TABS: 100.0000 mg | ORAL_TABLET | Freq: Every day | ORAL | 5 refills | Status: DC

## 2017-02-19 NOTE — Telephone Encounter (Signed)
Called and spoke to pt. Informed her of the recs per SN. Rx sent to preferred pharmacy. Pt verbalized understanding and denied any further questions or concerns at this time.

## 2017-02-19 NOTE — Telephone Encounter (Signed)
Pt in lobby, Valsartan is on recall Needs alternative.  Pt advised that we would call him with recommendations.   Please advise Dr Lenna Gilford. Thanks.

## 2017-02-19 NOTE — Telephone Encounter (Signed)
Per SN---  Ok to change her to losartan 100mg   Daily  This is the equivalent to what she was on.

## 2017-03-07 ENCOUNTER — Ambulatory Visit (INDEPENDENT_AMBULATORY_CARE_PROVIDER_SITE_OTHER): Payer: Medicare Other | Admitting: *Deleted

## 2017-03-07 DIAGNOSIS — Z5181 Encounter for therapeutic drug level monitoring: Secondary | ICD-10-CM

## 2017-03-07 DIAGNOSIS — I480 Paroxysmal atrial fibrillation: Secondary | ICD-10-CM | POA: Diagnosis not present

## 2017-03-07 LAB — POCT INR: INR: 2.8

## 2017-03-09 ENCOUNTER — Other Ambulatory Visit: Payer: Self-pay | Admitting: Cardiology

## 2017-03-09 DIAGNOSIS — I480 Paroxysmal atrial fibrillation: Secondary | ICD-10-CM

## 2017-03-26 NOTE — Progress Notes (Signed)
[facility]  PATIENT: Cynthia Mathis DOB: [birth year]   REASON FOR VISIT:  Follow-up  Mild cognitive impairment HISTORY FROM: patient and husband    HISTORY OF PRESENT ILLNESS:UPDATE  9/18/2018CM  Patient returns for follow-up history of mild cognitive impairment. Mini-Mental Status exam table at 24 out of 30. Remains on donepezil and memantine  Without side effects. Continues walking everyday. Plays pickle ball everyday for about 2 hours.  Driving without difficulty has not gotten lost.  Appetite. Good , sleeping well. No specific complaints she and husband both feel memory is stable.  UPDATE 12/27/15: Since last visit, doing well. Memory issues are stable. Tolerating meds (donepezil and memantine) but takes memantine only 10mg  at bedtime; day time dose caused "fogginess".   UPDATE 01/04/15: Since last visit, started memantine 10mg  BID, could not tolerate, so started taking it daily. No new events. Memory stable.   UPDATE 07/01/14: Since last visit, memory loss has progressed. Now on donepezil 10mg  qhs per [name]. Tolerating without side effects.  UPDATE 04/23/13: Since last visit patient continues to have short-term memory problems. This is when she has conversations with her husband and then cannot remember the details later. She also has some difficulty remembering peoples names after meeting them. Other people have not noticed any significant memory problems. Patient notices these problems primarily herself. Patient denies any significant depression. He does have some anxiety feelings with tense turning feeling in her stomach especially if her husband gets upset with her.  PRIOR HPI (10/07/12): 83 year old right-handed female here for evaluation of memory loss. Patient accompanied by her husband. For past one year patient is having increasing trouble with forgetting things. She's having to rely on taking notes, maintaining a calendar, using lists to remember  things. Patient accompanied by her husband, who also has been diagnosed with mild cognitive impairment versus mild dementia by [name]. He takes galantamine, which he says has helped his memory. Other examples include forgetting recent conversations, recent events, repeating questions over and over. Unfortunately the only informant of this problem is the patient's husband, who also happens to have mild cognitive impairment or dementia. No other family members or friends have noticed this problem in the patient. Otherwise patient is doing well still able to maintain activities of daily living including cooking, cleaning, driving, shopping.   REVIEW OF SYSTEMS: Full 14 system review of systems performed and notable only for those listed, all others are neg:  Constitutional: neg  Cardiovascular: neg Ear/Nose/Throat: neg  Skin: neg Eyes: neg Respiratory: neg Gastroitestinal: neg  Hematology/Lymphatic: neg  Endocrine: neg Musculoskeletal:neg Allergy/Immunology:  Allergies seasonal Neurological: neg Psychiatric: neg Sleep : neg   ALLERGIES: Allergies  Allergen Reactions  . Epinephrine Palpitations    REACTION: heart racing    HOME MEDICATIONS: Outpatient Medications Prior to Visit  Medication Sig Dispense Refill  . atorvastatin (LIPITOR) 10 MG tablet Take 1 tablet (10 mg total) by mouth daily. 90 tablet 1  . Calcium Carbonate-Vitamin D (CALCIUM 600+D) 600-400 MG-UNIT per tablet Take 1 tablet by mouth daily.    . Cyanocobalamin (VITAMIN B 12 PO) Take 1 tablet by mouth daily. Reported on 12/27/2015    . dicyclomine (BENTYL) 10 MG capsule Take 1 capsule (10 mg total) by mouth 4 (four) times daily -  before meals and at bedtime. 90 capsule 5  . donepezil (ARICEPT) 10 MG tablet TAKE ONE TABLET BY MOUTH EVERY NIGHT AT BEDTIME 90 tablet 3  . fish oil-omega-3 fatty acids 1000 MG capsule Take  1 g by mouth daily.    [name] glucosamine-chondroitin 500-400 MG tablet Take 1 tablet by mouth 3 (three)  times daily.    . hydrochlorothiazide (HYDRODIURIL) 25 MG tablet Take 1 tablet (25 mg total) by mouth daily. 90 tablet 3  . losartan (COZAAR) 100 MG tablet Take 1 tablet (100 mg total) by mouth daily. 30 tablet 5  . memantine (NAMENDA) 10 MG tablet TAKE ONE TABLET BY MOUTH TWICE A DAY 180 tablet 3  . Multiple Vitamins-Minerals (MULTIVITAMIN & MINERAL PO) Take 1 tablet by mouth daily.    . valsartan (DIOVAN) 160 MG tablet TAKE 1 TABLET (160 MG TOTAL) BY MOUTH DAILY. 90 tablet 3  . verapamil (CALAN-SR) 240 MG CR tablet Take 1 tablet (240 mg total) by mouth at bedtime. 90 tablet 3  . warfarin (COUMADIN) 5 MG tablet TAKE AS DIRECTED BY COUMADIN CLINIC 30 tablet 3   No facility-administered medications prior to visit.     PAST MEDICAL HISTORY: Past Medical History:  Diagnosis Date  . Anxiety   . Borderline diabetes mellitus   . Diverticulosis of colon   . DJD (degenerative joint disease)   . Dyslipidemia   . History of hematuria   . History of osteoporosis   . Hx of colonic polyps   . Hyperlipidemia   . Hypertension   . Hypothyroid   . Lipoma   . Macular degeneration   . Memory loss     PAST SURGICAL HISTORY: Past Surgical History:  Procedure Laterality Date  . APPENDECTOMY  1947  . COLONOSCOPY    . MASS EXCISION  04/17/2012   Procedure: EXCISION MASS;  Surgeon: [name], MD;  Location: [facility];  Service: General;  Laterality: N/A;  . THYROIDECTOMY  1990   [name]    FAMILY HISTORY: Family History  Problem Relation Age of Onset  . Stroke Mother   . Colon cancer Neg Hx   . Stomach cancer Neg Hx     SOCIAL HISTORY: Social History   Social History  . Marital status: Married    Spouse name: [name]  . Number of children: 3  . Years of education: [employer]   Occupational History  . seamstress Retired   Social History Main Topics  . Smoking status: Former Smoker    Quit date: 07/10/1965  . Smokeless tobacco: Never Used      Comment: 3 cigs per week x 3 years (started in 1964), 12/27/15 does not smoke  . Alcohol use 0.0 oz/week     Comment: occasionally  . Drug use: No  . Sexual activity: Not on file   Other Topics Concern  . Not on file   Social History Narrative   Pt lives at home with her spouse Mariane Masters Ciano, who has been dx'd with mild cognitive impairment vs. mild dementia, former pt of [name]). She does not use caffeine, if so, rarely.     PHYSICAL EXAM  Vitals:   03/27/17 1442  BP: 136/68  Pulse: (!) 55  Weight: 146 lb 9.6 oz (66.5 kg)   Body mass index is 26.81 kg/m.  Generalized: Well developed, in no acute distress  Head: normocephalic and atraumatic,. Oropharynx benign  Neck: Supple, no carotid bruits  Cardiac: Regular rate rhythm, no murmur  Musculoskeletal: No deformity   Neurological examination   Mentation: Alert oriented to time, place, history taking. Attention span and concentration appropriate.  Follows all commands speech and language fluent. MMSE 24/30. AFT 9. Clock  drawing 4/4  Cranial nerve II-XII: .Pupils were equal round reactive to light extraocular movements were full, visual field were full on confrontational test. Facial sensation and strength were normal. hearing was intact to finger rubbing bilaterally. Uvula tongue midline. head turning and shoulder shrug were normal and symmetric.Tongue protrusion into cheek strength was normal. Motor: normal bulk and tone, full strength in the BUE, BLE,  Sensory: normal and symmetric to light touch,  Coordination: finger-nose-finger, heel-to-shin bilaterally, no dysmetria Reflexes: Brachioradialis 2/2, biceps 2/2, triceps 2/2, patellar 2/2, Achilles 2/2, plantar responses were flexor bilaterally. Gait and Station: Rising up from seated position without assistance, normal stance,  moderate stride, good arm swing, smooth turning, able to perform tiptoe, and heel walking without difficulty. Tandem gait is steady  DIAGNOSTIC DATA  (LABS, IMAGING, TESTING) - I reviewed patient records, labs, notes, testing and imaging myself where available.  Lab Results  Component Value Date   WBC 8.9 02/13/2017   HGB 14.4 02/13/2017   HCT 43.1 02/13/2017   MCV 92.1 02/13/2017   PLT 254.0 02/13/2017      Component Value Date/Time   NA 141 02/13/2017 0905   K 4.1 02/13/2017 0905   CL 102 02/13/2017 0905   CO2 30 02/13/2017 0905   GLUCOSE 106 (H) 02/13/2017 0905   BUN 17 02/13/2017 0905   CREATININE 0.86 02/13/2017 0905   CALCIUM 9.0 02/13/2017 0905   PROT 7.2 11/20/2016 1240   ALBUMIN 4.2 11/20/2016 1240   AST 19 11/20/2016 1240   ALT 17 11/20/2016 1240   ALKPHOS 57 11/20/2016 1240   BILITOT 0.5 11/20/2016 1240   GFRNONAA 54 (L) 06/29/2016 1638   GFRAA >60 06/29/2016 1638   Lab Results  Component Value Date   CHOL 184 02/13/2017   HDL 52.40 02/13/2017   LDLCALC 100 (H) 02/13/2017   TRIG 157.0 (H) 02/13/2017   CHOLHDL 4 02/13/2017   Lab Results  Component Value Date   HGBA1C 6.4 02/13/2017   Lab Results  Component Value Date   VITAMINB12 819 10/07/2012   Lab Results  Component Value Date   TSH 0.65 02/13/2017      ASSESSMENT AND PLAN 83 y.o. year old female  has a past medical history of Macular degeneration; Hypertension; Dyslipidemia; Borderline diabetes mellitus; Hypothyroid; Diverticulosis of colon;  DJD (degenerative joint disease); History of osteoporosis; Memory loss; Anxiety; Lipoma; and Hyperlipidemia. here with history of short-term memory problems since March 2012. Neurologic examination today is unremarkable.   Cognitive testing has slightly worsened since 2014: MMSE 29/30 --> 28/30 --> 28/30 --> 26/30-->24/30 MOCA 21/30 --> 23/30 --> 21/30.  Dx: mild cognitive impairment vs mild dementia   PLAN; Continue Aricept and Namenda at current doses  Does not need refills  Memory score is stable  Continue walking and exercise routine   No safety issues identified Follow-up yearly and when  necessary Dennie Bible, [facility], [facility], APRN  [facility] [address] [place], [place] [address] [phone]

## 2017-03-27 ENCOUNTER — Ambulatory Visit (INDEPENDENT_AMBULATORY_CARE_PROVIDER_SITE_OTHER): Payer: Medicare Other | Admitting: Nurse Practitioner

## 2017-03-27 ENCOUNTER — Encounter: Payer: Self-pay | Admitting: Nurse Practitioner

## 2017-03-27 VITALS — BP 136/68 | HR 55 | Wt 146.6 lb

## 2017-03-27 DIAGNOSIS — R413 Other amnesia: Secondary | ICD-10-CM

## 2017-03-27 NOTE — Patient Instructions (Signed)
Continue Aricept and Namenda at current doses  Does not need refills  Memory score is stable  Continue walking and exercise routine  Follow-up yearly and when necessary

## 2017-04-17 ENCOUNTER — Ambulatory Visit (INDEPENDENT_AMBULATORY_CARE_PROVIDER_SITE_OTHER): Payer: Medicare Other | Admitting: *Deleted

## 2017-04-17 DIAGNOSIS — Z5181 Encounter for therapeutic drug level monitoring: Secondary | ICD-10-CM

## 2017-04-17 DIAGNOSIS — I480 Paroxysmal atrial fibrillation: Secondary | ICD-10-CM | POA: Diagnosis not present

## 2017-04-17 LAB — POCT INR: INR: 2.4

## 2017-04-24 NOTE — Progress Notes (Signed)
I reviewed note and agree with plan.   VIKRAM R. PENUMALLI, MD  Certified in Neurology, Neurophysiology and Neuroimaging  [facility] [address], [address] [place], [place] [address] [phone]   

## 2017-05-29 ENCOUNTER — Ambulatory Visit (INDEPENDENT_AMBULATORY_CARE_PROVIDER_SITE_OTHER): Payer: Medicare Other | Admitting: *Deleted

## 2017-05-29 DIAGNOSIS — Z5181 Encounter for therapeutic drug level monitoring: Secondary | ICD-10-CM

## 2017-05-29 DIAGNOSIS — I480 Paroxysmal atrial fibrillation: Secondary | ICD-10-CM

## 2017-05-29 LAB — POCT INR: INR: 2.3

## 2017-05-29 NOTE — Patient Instructions (Signed)
Continue taking 1/2 tablet daily except 1 tablet on Mondays and Fridays.  Recheck INR in 6 weeks. Call with any medication changes or if scheduled for any procedures [phone].

## 2017-06-26 DIAGNOSIS — H353133 Nonexudative age-related macular degeneration, bilateral, advanced atrophic without subfoveal involvement: Secondary | ICD-10-CM | POA: Diagnosis not present

## 2017-06-26 DIAGNOSIS — H353212 Exudative age-related macular degeneration, right eye, with inactive choroidal neovascularization: Secondary | ICD-10-CM | POA: Diagnosis not present

## 2017-06-26 DIAGNOSIS — H3562 Retinal hemorrhage, left eye: Secondary | ICD-10-CM | POA: Diagnosis not present

## 2017-06-26 DIAGNOSIS — H35351 Cystoid macular degeneration, right eye: Secondary | ICD-10-CM | POA: Diagnosis not present

## 2017-06-26 DIAGNOSIS — H353221 Exudative age-related macular degeneration, left eye, with active choroidal neovascularization: Secondary | ICD-10-CM | POA: Diagnosis not present

## 2017-07-04 DIAGNOSIS — K5730 Diverticulosis of large intestine without perforation or abscess without bleeding: Secondary | ICD-10-CM | POA: Insufficient documentation

## 2017-07-04 DIAGNOSIS — F419 Anxiety disorder, unspecified: Secondary | ICD-10-CM | POA: Insufficient documentation

## 2017-07-04 DIAGNOSIS — Z86010 Personal history of colonic polyps: Secondary | ICD-10-CM | POA: Insufficient documentation

## 2017-07-05 ENCOUNTER — Other Ambulatory Visit: Payer: Self-pay | Admitting: Pulmonary Disease

## 2017-07-11 ENCOUNTER — Ambulatory Visit (INDEPENDENT_AMBULATORY_CARE_PROVIDER_SITE_OTHER): Payer: Medicare Other

## 2017-07-11 DIAGNOSIS — Z5181 Encounter for therapeutic drug level monitoring: Secondary | ICD-10-CM | POA: Diagnosis not present

## 2017-07-11 DIAGNOSIS — I480 Paroxysmal atrial fibrillation: Secondary | ICD-10-CM | POA: Diagnosis not present

## 2017-07-11 LAB — POCT INR: INR: 2.8

## 2017-07-11 NOTE — Patient Instructions (Signed)
Continue taking 1/2 tablet daily except 1 tablet on Mondays and Fridays.  Recheck INR in 6 weeks. Call with any medication changes or if scheduled for any procedures [phone].

## 2017-07-16 ENCOUNTER — Telehealth: Payer: Self-pay | Admitting: Pulmonary Disease

## 2017-07-16 NOTE — Telephone Encounter (Signed)
Spoke with patient. She stated that she was concerned about taking losartan since the recall. Asked if patient had called her pharmacy first to see if she was taking the recalled medication. She stated that she had called the pharmacy and they would not give her any information.   Advised patient that she would need to call the pharmacy back and ask for the lot number and manufacturer, they have to disclose this information to her. She verbalized understanding and stated that she would call back to seek more information.   Nothing else needed at time of call.

## 2017-07-16 NOTE — Telephone Encounter (Signed)
Called pt and left her a message to give [place] a call back x1.

## 2017-07-16 NOTE — Telephone Encounter (Signed)
Pt returning call. Cb is [phone].

## 2017-07-23 ENCOUNTER — Encounter: Payer: Self-pay | Admitting: Cardiology

## 2017-07-23 ENCOUNTER — Ambulatory Visit: Payer: Medicare Other | Admitting: Cardiology

## 2017-07-23 VITALS — BP 118/80 | HR 52 | Ht 62.0 in | Wt 146.0 lb

## 2017-07-23 DIAGNOSIS — E785 Hyperlipidemia, unspecified: Secondary | ICD-10-CM | POA: Diagnosis not present

## 2017-07-23 DIAGNOSIS — Z7901 Long term (current) use of anticoagulants: Secondary | ICD-10-CM

## 2017-07-23 DIAGNOSIS — I10 Essential (primary) hypertension: Secondary | ICD-10-CM

## 2017-07-23 DIAGNOSIS — I480 Paroxysmal atrial fibrillation: Secondary | ICD-10-CM

## 2017-07-23 NOTE — Patient Instructions (Signed)
Medication Instructions:   Your physician recommends that you continue on your current medications as directed. Please refer to the Current Medication list given to you today.    Labwork:  A WEEK PRIOR TO YOUR 6 MONTH FOLLOW-UP APPOINTMENT WITH DR NELSON TO CHECK---CMET, CBC W DIFF, AND LIPIDS---PLEASE COME FASTING TO THIS LAB APPOINTMENT     Follow-Up:  Your physician wants you to follow-up in: [place] will receive a reminder letter in the mail two months in advance. If you don't receive a letter, please call our office to schedule the follow-up appointment.  PLEASE HAVE YOUR LABS DONE A WEEK OR SO PRIOR TO THIS OFFICE VISIT PER DR NELSON       If you need a refill on your cardiac medications before your next appointment, please call your pharmacy.

## 2017-07-23 NOTE — Progress Notes (Signed)
Cardiology Office Note    Date:  07/23/2017   ID:  Cynthia Mathis, DOB 1933-01-31, MRN [id number]  PCP:  Cynthia Space, MD  Cardiologist:  Cynthia Dawley, MD  Referring physician: Noralee Space, MD Chief complain: Follow up for new dg of PAF  History of Present Illness:  Cynthia Mathis is a 84 y.o. female with h/o DM, hyperlipidemia, hypertension who presented to the ER on 06/29/2016 with palpitations and was diagnosed with new onset atrial fibrillation with RVR - ventricular rate 140 BPM. The patient was symptomatic with dizziness and felt the onset of atrial fibrillation. While in the ER she spontaneously cardioverted into the SR and was discharged home. CHADVASC score: 5. She was started on Eliquis and reports no bleeding. No palpitations since then.   11/24/2016 - the patient is coming after 3 months, since the last visit she hasn't experienced any chest pain shortness of breath no dizziness no palpitation presyncope or syncope. She denies any bleeding with warfarin and no muscle pain with Lipitor. She has been experiencing abdominal pains and diarrhea as and might possibly need a colonoscopy.  07/23/2017 -this is 6 months follow-up, patient is rate she continues to be global daily with no symptoms of shortness of breath, chest pain, dizziness or syncope. She hasn't experienced any palpitations at all. She has been compliant to her medications and has no bleeding warfarin no muscle pain with Lipitor. She denies any claudications.  Past Medical History:  Diagnosis Date  . Anxiety   . Borderline diabetes mellitus   . Diverticulosis of colon   . DJD (degenerative joint disease)   . Dyslipidemia   . History of hematuria   . History of osteoporosis   . Hx of colonic polyps   . Hyperlipidemia   . Hypertension   . Hypothyroid   . Lipoma   . Macular degeneration   . Memory loss     Past Surgical History:  Procedure Laterality Date  . APPENDECTOMY  1947  . COLONOSCOPY      . MASS EXCISION  04/17/2012   Procedure: EXCISION MASS;  Surgeon: Cynthia Faster. Cornett, MD;  Location: [facility];  Service: General;  Laterality: N/A;  . THYROIDECTOMY  1990   Cynthia Mathis   Current Medications: Outpatient Medications Prior to Visit  Medication Sig Dispense Refill  . atorvastatin (LIPITOR) 10 MG tablet Take 1 tablet (10 mg total) by mouth daily. 90 tablet 1  . Calcium Carbonate-Vitamin D (CALCIUM 600+D) 600-400 MG-UNIT per tablet Take 1 tablet by mouth daily.    . Cyanocobalamin (VITAMIN B 12 PO) Take 1 tablet by mouth daily. Reported on 12/27/2015    . dicyclomine (BENTYL) 10 MG capsule Take 1 capsule (10 mg total) by mouth 4 (four) times daily -  before meals and at bedtime. 90 capsule 5  . donepezil (ARICEPT) 10 MG tablet TAKE ONE TABLET BY MOUTH EVERY NIGHT AT BEDTIME 90 tablet 3  . fish oil-omega-3 fatty acids 1000 MG capsule Take 1 g by mouth daily.    [name] glucosamine-chondroitin 500-400 MG tablet Take 1 tablet by mouth 3 (three) times daily.    . hydrochlorothiazide (HYDRODIURIL) 25 MG tablet Take 1 tablet (25 mg total) by mouth daily. 90 tablet 3  . losartan (COZAAR) 100 MG tablet TAKE ONE TABLET BY MOUTH DAILY 90 tablet 4  . memantine (NAMENDA) 10 MG tablet TAKE ONE TABLET BY MOUTH TWICE A DAY 180 tablet 3  . Multiple Vitamins-Minerals (MULTIVITAMIN & MINERAL PO)  Take 1 tablet by mouth daily.    . valsartan (DIOVAN) 160 MG tablet TAKE 1 TABLET (160 MG TOTAL) BY MOUTH DAILY. 90 tablet 3  . verapamil (CALAN-SR) 240 MG CR tablet Take 1 tablet (240 mg total) by mouth at bedtime. 90 tablet 3  . warfarin (COUMADIN) 5 MG tablet TAKE AS DIRECTED BY COUMADIN CLINIC 30 tablet 3   No facility-administered medications prior to visit.      Allergies:   Epinephrine   Social History   Socioeconomic History  . Marital status: Married    Spouse name: Cynthia Mathis  . Number of children: 3  . Years of education: [employer]  . Highest education level: None   Social Needs  . Financial resource strain: None  . Food insecurity - worry: None  . Food insecurity - inability: None  . Transportation needs - medical: None  . Transportation needs - non-medical: None  Occupational History  . Occupation: Education officer, environmental: RETIRED  Tobacco Use  . Smoking status: Former Smoker    Last attempt to quit: 07/10/1965    Years since quitting: 52.0  . Smokeless tobacco: Never Used  . Tobacco comment: 3 cigs per week x 3 years (started in 1964), 12/27/15 does not smoke  Substance and Sexual Activity  . Alcohol use: Yes    Alcohol/week: 0.0 oz    Comment: occasionally  . Drug use: No  . Sexual activity: None  Other Topics Concern  . None  Social History Narrative   Pt lives at home with her spouse Cynthia Mathis, who has been dx'd with mild cognitive impairment vs. mild dementia, former pt of [name]). She does not use caffeine, if so, rarely.     Family History:  The patient's family history includes Stroke in her mother.   ROS:   Please see the history of present illness.    ROS All other systems reviewed and are negative.   PHYSICAL EXAM:   VS:  BP 118/80   Pulse (!) 52   Ht 5\' 2"  (1.575 m)   Wt 146 lb (66.2 kg)   BMI 26.70 kg/m    GEN: Well nourished, well developed, in no acute distress  HEENT: normal  Neck: no JVD, carotid bruits, or masses Cardiac: RRR; no murmurs, rubs, or gallops,no edema, poor peripheral pulses Respiratory:  clear to auscultation bilaterally, normal work of breathing GI: soft, nontender, nondistended, + BS MS: no deformity or atrophy  Skin: warm and dry, no rash Neuro:  Alert and Oriented x 3, Strength and sensation are intact Psych: euthymic mood, full affect  Wt Readings from Last 3 Encounters:  07/23/17 146 lb (66.2 kg)  03/27/17 146 lb 9.6 oz (66.5 kg)  02/12/17 145 lb 2 oz (65.8 kg)    Studies/Labs Reviewed:   EKG:  EKG is ordered today general 14 2019 shows sinus bradycardia otherwise normal EKG  unchanged from prior, this was personally reviewed.  Recent Labs: 11/20/2016: ALT 17 02/13/2017: BUN 17; Creatinine, Ser 0.86; Hemoglobin 14.4; Platelets 254.0; Potassium 4.1; Sodium 141; TSH 0.65   Lipid Panel    Component Value Date/Time   CHOL 184 02/13/2017 0905   TRIG 157.0 (H) 02/13/2017 0905   HDL 52.40 02/13/2017 0905   CHOLHDL 4 02/13/2017 0905   VLDL 31.4 02/13/2017 0905   LDLCALC 100 (H) 02/13/2017 0905   Additional studies/ records that were reviewed today include:   TTE: 09/2016 - Left ventricle: The cavity size was normal. There was  moderate   focal basal hypertrophy of the septum with mild posterior wall   hypertrophy. Systolic function was normal. The estimated ejection   fraction was in the range of 60% to 65%. Wall motion was normal;   there were no regional wall motion abnormalities. Doppler   parameters are consistent with abnormal left ventricular   relaxation (grade 1 diastolic dysfunction). Doppler parameters   are consistent with indeterminate ventricular filling pressure. - Aortic valve: Transvalvular velocity was within the normal range.   There was no stenosis. There was mild regurgitation. - Right ventricle: The cavity size was normal. Wall thickness was   normal. Systolic function was normal. - Atrial septum: No defect or patent foramen ovale was identified   by color flow Doppler. - Tricuspid valve: There was mild regurgitation. - Pulmonary arteries: Systolic pressure was within the normal   range. PA peak pressure: 33 mm Hg (S).    ASSESSMENT:    1. PAF (paroxysmal atrial fibrillation) ([place])   2. Hyperlipidemia, unspecified hyperlipidemia type   3. Chronic anticoagulation   4. Essential hypertension    PLAN:  In order of problems listed above:  1. The patient remains in [place]. We will continue verapamil. Continue coumadin, no bleeding, therapeutic INR. Hemoglobin 14.4 in August 2018, we will repeat in 6 months. Echocardiogram in 2017  showed LVEF 60-65%, normal left atrial size. 2. Hypertension  - well controlled. 3. Hyperlipidemia, borderline triglycerides otherwise at goal HDL and LDL, she tolerates atorvastatin well.   Medication Adjustments/Labs and Tests Ordered: Current medicines are reviewed at length with the patient today.  Concerns regarding medicines are outlined above.  Medication changes, Labs and Tests ordered today are listed in the Patient Instructions below. There are no Patient Instructions on file for this visit.   Signed, Cynthia Dawley, MD  07/23/2017 9:09 AM    [address], [place], [place]  [address] Phone: [phone]; Fax: [phone]

## 2017-08-06 DIAGNOSIS — Z961 Presence of intraocular lens: Secondary | ICD-10-CM | POA: Diagnosis not present

## 2017-08-06 DIAGNOSIS — H353221 Exudative age-related macular degeneration, left eye, with active choroidal neovascularization: Secondary | ICD-10-CM | POA: Diagnosis not present

## 2017-08-06 DIAGNOSIS — H3562 Retinal hemorrhage, left eye: Secondary | ICD-10-CM | POA: Diagnosis not present

## 2017-08-15 ENCOUNTER — Encounter: Payer: Self-pay | Admitting: Pulmonary Disease

## 2017-08-15 ENCOUNTER — Ambulatory Visit: Payer: Medicare Other | Admitting: Pulmonary Disease

## 2017-08-15 VITALS — BP 130/68 | HR 51 | Ht 62.0 in | Wt 143.8 lb

## 2017-08-15 DIAGNOSIS — E039 Hypothyroidism, unspecified: Secondary | ICD-10-CM | POA: Diagnosis not present

## 2017-08-15 DIAGNOSIS — I10 Essential (primary) hypertension: Secondary | ICD-10-CM

## 2017-08-15 DIAGNOSIS — R413 Other amnesia: Secondary | ICD-10-CM | POA: Diagnosis not present

## 2017-08-15 DIAGNOSIS — Z23 Encounter for immunization: Secondary | ICD-10-CM

## 2017-08-15 DIAGNOSIS — H3530 Unspecified macular degeneration: Secondary | ICD-10-CM

## 2017-08-15 DIAGNOSIS — M8580 Other specified disorders of bone density and structure, unspecified site: Secondary | ICD-10-CM

## 2017-08-15 DIAGNOSIS — M159 Polyosteoarthritis, unspecified: Secondary | ICD-10-CM

## 2017-08-15 DIAGNOSIS — R7301 Impaired fasting glucose: Secondary | ICD-10-CM

## 2017-08-15 DIAGNOSIS — M150 Primary generalized (osteo)arthritis: Secondary | ICD-10-CM | POA: Diagnosis not present

## 2017-08-15 DIAGNOSIS — E7800 Pure hypercholesterolemia, unspecified: Secondary | ICD-10-CM

## 2017-08-15 DIAGNOSIS — I480 Paroxysmal atrial fibrillation: Secondary | ICD-10-CM

## 2017-08-15 NOTE — Patient Instructions (Signed)
Today we updated your med list in our EPIC system...    Continue your current medications the same...  Keep up the good work w/ your low carb diet & exercise program...  Call for any questions...  Let's plan a follow up visit in 75mo w/ fasting blood work at that time.Marland KitchenMarland Kitchen

## 2017-08-16 ENCOUNTER — Encounter: Payer: Self-pay | Admitting: Pulmonary Disease

## 2017-08-16 NOTE — Progress Notes (Signed)
Subjective:    Patient ID: Cynthia Mathis, female    DOB: 10-24-1932, 84 y.o.   MRN: [id number]  HPI 84 y/o WF here for a follow up visit... she has mult med problems as noted below ...  ~  SEE PREV EPIC NOTES FOR OLDER DATA >>   ~  sees DrPerry for GI- divertics, polyps, hems & a bout of diverticulitis in Mar08...  ~  sees Dr Risa Grill Urology w/ microscopic hematuria and a neg eval 5/08... 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 ~  sees DrLLomax for Derm w/ alopecia- offered Rogaine but decided against it... ~  Sees DrPenumali for Neuro w/ memory loss, early stage dementia...   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  September 17, 2012:  concerned about her memory; husb has been under the care of DrLove for >23yr w/ mild cognitive impairment  LABS 3/14:  Chems- wnl w/ BS=106, A1c=6.2;  TSH=0.37;  Sed=14...    January 23, 2013:  visit w/ DrPenumalli 3/14> her MMSE was 29/30 & he planned to check MRI Brain & B12 level, he did not rec starting meds yet (husb takes Razadyne & feels it helps him).  ADDENDUM 04/15/13 >> pt called & wants off Simva20 after reading something about memory loss=> change to Atorva20 (she never did).  CXR 1/15 showed heart at upper lim of norm, lungs clear (mild dev trachea to right c/w enlarged left lobe of thyroid- no change).  EKG 1/15 showed SBrady, rate44, NSSTTW changes.  LABS 1/15:  FLP- at goals on Simva20;  Chems- wnl;  CBC- wnl;  TSH=0.59;  VitD=54.  BMD 08/22/13>  Lowest Tscore= -0.9 in APspine... She is off bisphos therapy for several yrs now & remains improved...   NOTE:  She wants to try ARICEPT10 start 1/2 => 1 tab daily as trial; Pulse is low at 48 & we decided to stop ToprolXL & replace it w/ VALSARTAN160/d.  LABS, Mammogram, CXR, BMD, EKG> all reviewed ...  ~  [id number]  it took quite a bit of time getting her med  list straight- she did not know her meds, her card w/ meds written on it was old & out of date, Epic meds were not up to date either & we called Pharm- pt is reminded to bring all med bottles to every office visit..Marland KitchenMarland Kitchenhe has mult minor somatic complaints...  LABS 2/16:  FLP- at goals on Simva20;  Chems- wnl;  CBC- wnl;  TSH=0.76... ~  [id number]  Cynthia Mathis has mult somatic complaints> runny nose (offered Zyrtek & Astelin but she doesn't want more meds), cold feet, hot & sweaty, thirsty, right knee pain, stepped on a hornets nest w/ 12 stings and reaction; she also reported an episode of syncope [place] was taken to daughter's doctor & reports that everything checked out OK- we do not have the data... She remains very active & plays "pickle ball" everyday.  CT Abd 10/01/14>  Severe diverticulosis & mild acute sigm diverticulitis + inflammed distal rectum/anus c/w acute proctitis; 127msolitary cystic lesion in body of pancreas, gallstones and atherosclerotic changes in Ao...   ~  May 06, 2015:  59m70moV & add-on appt requested by pt due to right knee pain and LBP for the last 1wk she says; no known trauma or injury, and she denies prev problems x occas soreness; she thinks that she  may have had a shot in her knee once several yrs ago; as for the back pain- it is dull, in center of back w/o radiation...     EXAM shows Afeb, VSS, O2sat=97%;  HEENT- neg, mallampati1;  Chest- clear w/o w/r/r;  Heart- RR w/o m/r/g;  Abd- soft, nontender, neg;  Ext- +crepitus in right knee, w/o c/c/e;  Neuro- intact...   XRay right knee 05/06/15> osteoarthtitis w/ medial joint space compartment narrowing with milder patellofemoral joint space compartment narrowing. Marginal osteophytes project from all 3 compartments, mild spurring along the tibial spine, no joint effusion; Bones are demineralized...  XRay LS spine 05/06/15>  Disc space narrowing at L2-3 with endplate sclerosis. Mild osteophytic  changes are seen. Diffuse aortic calcifications are noted. NAD... IMP/PLAN>>  Cynthia Mathis is c/o 1 wk hx right knee & LBP; exam shows good ROM right knee w/ crepitation; XRays indicate degen arthritis; we discussed Rx w/ Tramadol+Tylenol, neoprene sleeve for knee and rest/ heat for back; I indicated next step is to refer to Ortho for shot if symptoms don't respond to Rx...   ~  August 11, 2015:  3moROV & Cynthia Mathis is stable- c/o runny nose, LBP, shoulder pain, right knee pain; she has tried Tramadol w/o much benefit & we discussed adding topical heat, ParafonTid 7 refer to Ortho for further eval... We reviewed the following medical problems during today's office visit >>     Macular Degen> followed by DrHecker & DrRankin; she received shots MD & had bilat cat surg but we do not have notes from them...    Hearing Loss> she saw DrGore, ENT w/ audiology showing bilat SNHL...    HBP> on ASA81, Valsartan160, Verap240, HCT25; BP= 142/68 & she denies HA, CP, palpit, dizzy, SOB, edema, etc...    Hyperlipid> on Simva20; FLP 2/17 show TChol 155, TG 71, HDL 60, LDL 80; continue same meds...    Hx Impaired FBS> Hx BS in the 130 range prev, but all readings ~100 x yrs now;  Labs 2/17 showed BS=94    Hypothy> hx multinod thyroid x yrs; had thyroidectomy 1990 by DrWeatherly; she weaned off Thyroid hormone yrs ago & has remained clinically & biochem euthyroid; Labs 2/17 showed TSH= 0.68    GI- Divertics, Polyps> hx diverticulitis in 2008- followed by DrPerry; last colonoscopy 5/16 showed severe diverticulosis w/o inflamm or polyps seen...    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 Labs 2/17 showed Hg= 14.6 & prev LFTs are WNL.    DJD, Osteoporosis> on Ca, MVI, Glucosamine; she uses OTC analgesic meds as needed; see BMDs below- on Bisphos drug holiday since 7/11 & due for f/u BMD... Labs 2/17 w/ VitD= 44    Memory Loss> eval by  [facility] 4/14, DrPenumalli; felt to have mild cognitive impairment vs normal aging, then w/ progressive impairment & they have her on Donepezil10 & Namenda10Bid now... EXAM shows Afeb, VSS, O2sat=98%;  HEENT- neg, mallampati1;  Chest- clear w/o w/r/r;  Heart- RR w/o m/r/g;  Abd- soft, nontender, neg;  Ext- +crepitus in right knee, w/o c/c/e;  Neuro- intact...   LABS 08/11/15>  FLP- all parameters at goals on Simva20;  Chems- wnl;  CBC- wnl;  TSH=0.68;  VitD=44...  IMP/PLAN>>  Cynthia Mathis is stable- we discussed trying heat, Tramadol, Parafon for her arthritic & LBP complaints; we will refer to Ortho; rec routine ROV in 639mo.   ~  February 09, 2016:  9m71mo  ROV & Cynthia Mathis is stable overall but persists w/ mult somatic complaints like her runny nose (Astelin rx- offered alternative but she does not want new meds); she still has LBP, shoulder pain, knee pain- on Parafon, Tramadol, & heat but she did not go to Ortho for further eval as rec last visit; she is also c/o difficulty hearing & has hearing aides [facility] but says it isn't right- offered 2nd opinion from ENT & she will let me know;  She also c/o noct leg cramps- we reviewed the usual recs for tonic water vs yellow mustard;  Finally she is also c/o visual difficulties- she had cat surg by DrHecker, and is followed by retina specialist DrRankin w/ macular degen s/p shots...    Macular Degen> followed by DrHecker & DrRankin; she received shots for MD & had bilat cat surg but we do not have notes from them...    Hearing Loss> she saw DrGore, ENT w/ audiology showing bilat SNHL, she has bilat hearing aides but notes that they are not right...    HBP> on ASA81, Valsartan160, Verap240, HCT25; BP= 132/70 & she denies HA, CP, palpit, dizzy, SOB, edema, etc...    Hyperlipid> on Simva20; FLP 2/17 show TChol 155, TG 71, HDL 60, LDL 80; continue same meds...    Hx Impaired FBS> Hx BS in the 130 range prev, but all readings ~100 x yrs now on diet alone;  Labs 2/17  showed BS=94    Hypothy> hx multinod thyroid x yrs; had thyroidectomy 1990 by DrWeatherly; she weaned off Thyroid hormone yrs ago & has remained clinically & biochem euthyroid; Labs 2/17 showed TSH= 0.68    GI- Divertics, Polyps> hx diverticulitis in 2008- followed by DrPerry; last CT Abd 09/2014 w/ severe diverticulosis & mild acute sigmoid diverticulitis- treated w/ Cipro/Flagyl & resolved; f/u colonoscopy 5/16 showed severe diverticulosis w/o inflamm or polyps seen, advised Miralax etc...    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 Labs 2/17 showed Hg= 14.6 & prev LFTs are WNL.    GYN- DrRichardson    DJD, Osteoporosis> on Ca, MVI, Glucosamine; she uses OTC analgesic meds as needed; see BMDs below- on Bisphos drug holiday since 7/11 & due for f/u BMD... Labs 2/17 w/ VitD= 44    Memory Loss> eval by [facility] 4/14, DrPenumalli & recent f/u 12/2015 (MMSE=26) felt to have mild cognitive impairment vs normal aging, then w/ progressive impairment & they have her on Donepezil10 & Namenda10Bid.  EXAM shows Afeb, VSS, O2sat=98%;  HEENT- neg, mallampati1;  Chest- clear w/o w/r/r;  Heart- RR w/o m/r/g;  Abd- soft, nontender, neg;  Ext- +crepitus in right knee, w/o c/c/e;  Neuro- early dementia, no focal neuro changes...  IMP/PLAN>>  Cynthia Mathis has mult somatic complaints and we discussed each (again) & I wonder about relation to her dementia; she will continue current meds and we reviewed tonic water vs yellow mustard for the noct leg cramps & wrote this down on her AVS; we plan ROV in 24mow/ f/u CXR, EKG, Fasting labs... Note: >50% of this 270m visit was spent in counseling & coordination of care...  ~  August 14, 2016:  32m20moV & Cynthia Mathis seen in the ER 06/29/16 with palpitations> EKG showed Afib w/ rvr 138, then resolved spont in ER to NSR, Exam was otherw unremarkable, CHADsVASC score=5; CXR- NAD and LABS- ok...  She was placed on Eliquis2.5Bid & Propranolol  4m prn palpit & asked to f/u w/ CARDS;  She had f/u appt here w/ PA- Tammy Parrett on 07/14/16> pt reported doing well- no recurrent palpit, no CP/ SOB/ etc, doing well on Eliquis w/o bleeding/ bruising/ etc; f/u EKG showed holding SBrady, rate 54 => she has a CARDS appt w/ DrNelson 08/24/16...    Her CC= constant right knee pain => Rx w/ OTC Advil/ Aleve / Tylenol prn & we will refer to Ortho for further eval... EXAM shows Afeb, VSS, O2sat=98%;  HEENT- neg, mallampati1;  Chest- clear w/o w/r/r;  Heart- RR w/o m/r/g;  Abd- soft, nontender, neg;  Ext- +crepitus in right knee, w/o c/c/e;  Neuro- early dementia, no focal neuro changes...   CXR 06/29/16>  Norm heart size, clear lungs- NAD, thoracic kyphosis & spondylosis...   EKG 06/29/16>  AFlutter w/ variable conduction, HR=137;  Converted spont to NSR rate 67, EKG otherw wnl...  LABS 06/2016 in Epic>  CBC/ Chems- reviewed & ok;  TSH=0.45 IMP/PLAN>>  Cynthia Mathis had episode of AFlutter w/ rvr & converted spont to NSR, started on Eliquis2.5Bid for ChadsVasc scaore of 5; she has yet to see cards but appt w/ DrNelson set for 08/24/16... Her CC is right knee pain & we will send to Ortho for assessmant...  ADDENDUM>>  She saw DrNelson 08/24/16 HBP, HL, palpit=> episode of PAF w/ rvr 06/2016, spont coverted to NSR in ER w/o intervention, started on Eliquis2.5Bid, no recurrent palpit since then; Eliquis was too expensive & she was switched to Coumadin;  EKG showed SBrady, otherw wnl;  2DEcho performed 09/11/16=> mod focal basilar hypertrophy, norm sys function w/ EF=60-65%, no regional wall motion abn, Gr1DD, AoV mildly thickened calcif leaflets- no AS, mild AI; norm MV, normal LA at 320m norm RV, PAsys=33...  ~  Nov 20, 2016:  68m15moV & add-on appt requested for abd discomfort> She called last wk c/o 2wk hx abd discomfort, mid abd area, not feeling well, but denied n/v/d/ blood seen & no f/c/s etc; she declined to be  seen in ER/ urgent care & set up this appt to see me today;  She is followed for GI by DrPerry w/ hx severe diverticulosis on her last colon 11/2014 and a bout of clinical diverticulitis in 2008 and CT Abd in 09/2014 w/ severe divertics and mild acute sigm diverticulitis treated w/ Flagyl/ Cipro;  SHE FEELS THAT HER SYMPTOMS HAVE IMPROVED OVER THE LAST WEEK ON GLUTEN FREE DIET-- and she tells us Korear the 1st time that several relatives have Celiac disease (her grandmother, her brother, and her son) => we agreed to check Tissue Transglutaminase Antibody test (=> NEG) but her symptoms sound more like IBS-c & we will treat w/ Miralax as rec by DrPerry in the past, plus Bentyl 64m67md prn for abd discomfort...     She is otherw stable on Coumadin for hx PAF, BP controlled on Verap240, Diovan160, HCT25 and electrolytes are wnl...     She remains on Aricept & Namenda and pt & her husb are doing as well as can be expected given their advanced age & underlying issues... EXAM shows Afeb, VSS, O2sat=95%;  HEENT- neg, mallampati1;  Chest- clear w/o w/r/r;  Heart- RR w/o m/r/g;  Abd- soft, nontender, neg;  Ext- +crepitus in right knee, w/o c/c/e;  Neuro- early dementia, no focal neuro changes...   LABS 11/20/16>  Chems- ok w/ BS=116, Cr=0.92, LFTs- wnl;  CBC- wnl w/ Hg=14.5, wbc=9.9;  Sed rate=6;  Tissue Transglutaminase Ab=  NEG...  IMP/PLAN>>  Cynthia Mathis has known severe diverticulosis, hx hyperplastic polyps, hx hems;  Her symptoms sound like mild IBS & DrPerry prev rec Miralax to her but she is not using any of this;  Rec to take Miralax daily & adjust amt to keep stools soft & easy to pass w/o straining;  Plus Bentyl 56m Qid as needed for abd discomfort & cramps...  ~  February 12, 2017:  325moOV & general medical follow up visit>  Cynthia Mathis returns feeling well, prev abdominal issues resolved & have not returned, she has stopped the gluten-free diet & back to eating what she wants;  She [facility]. We reviewed the following medical problems during today's office visit >>     Macular Degen> followed by DrHecker & DrRankin; she received shots for MD & had bilat cat surg & Glaucoma suspect; [facility]...    Hearing Loss> she saw DrGore, ENT w/ audiology showing bilat SNHL, she has bilat hearing aides but notes that they are not right...    HBP> on ASA81, Valsartan160, Verap240, HCT25; BP= 140/80 by me today & she denies HA, CP, palpit, dizzy, SOB, edema, etc; [facility] ~120...    Hx PAF on Coumadin followed in CC> ER 12/17 w/ palpit & PAF w/ RVR, converted spont to NSR 7placed on coumadin...    Hyperlipid> on Simva20; FLP 8/18 shows TChol 184, TG 157, HDL 52, LDL 100; continue same meds, stress compliance + better diet...    Hx Impaired FBS> Hx BS in the 130 range prev, but all readings ~100 x yrs now on diet alone;  Labs 8/18 showed BS=106, A1c=6.4    Hypothy> hx multinod thyroid x yrs; had thyroidectomy 1990 by DrWeatherly; she weaned off Thyroid hormone yrs ago & has remained clinically & biochem euthyroid; Labs 8/18 showed TSH= 0.65    GI- Divertics, Polyps> hx diverticulitis in 2008- followed by DrPerry; last CT Abd 09/2014 w/ severe diverticulosis & mild acute sigmoid diverticulitis- treated w/ Cipro/Flagyl & resolved; f/u colonoscopy 5/16 showed severe diverticulosis w/o inflamm or polyps seen, advised Miralax etc...    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 We checked her Tissue Transglutaminase Abs-IgG&IgA (both of which were NEG)...  Labs 2/17 showed Hg= 14.6 & prev LFTs are WNL.    GYN- DrRichardson    DJD, Osteoporosis> on Ca, MVI, Glucosamine; she uses OTC analgesic meds as needed; see prev BMDs below- Tscore -3.2 in Spine 2001 &  started on Fosamax w/ improved scores on Rx; on Bisphos drug holiday since 7/11 & f/u BMD 08/2013 showed normal Tscores- she remains on Ca, MVI, VitD supplements...    Memory Loss> eval by [facility]- DrPenumalli & neuro f/u periodically (MMSE=24/30 [phone] to have mild cognitive impairment, then w/ progressive impairment & they have her on Donepezil10 & Namenda10Bid.  EXAM shows Afeb, VSS, O2sat=96%;  HEENT- neg, mallampati1;  Chest- clear w/o w/r/r;  Heart- RR w/o m/r/g;  Abd- soft, nontender, neg;  Ext- +crepitus in right knee, w/o c/c/e;  Neuro- early dementia, no focal neuro changes...   LABS 02/13/17 shows>  FLP- ok but reminded to take med every day;  Chems- ok w/ BS=106, A1c=6.4;  CBC- wnl w/ Hg=14.4;  TSH=0.65 IMP/PLAN>>  Cynthia Mathis feels that she is doing well- no new complaints or concerns;  She requests refill Rxs for 90d supplies;  She will ret for FASTING blood work (labs ok) & continue her protime checks in the coumadin clinic... We plan rov in 19mo soooner if needed for problems...    ~  August 15, 2017:  675moOV & Cynthia Mathis is holding up well & tells me that her husb recently had TAVR surg & although he's given up the pickle ball she wants me to know that6 she is still playing! She is doing satis- no new complaints or concerns, denies CP/ palpit/ SOB/ edema; notes min cough small amt clear phlegm, no f/c/s...     She saw Neuro-NMartin,NP 03/27/17>  Hx MCI w/ MMSE 24/30 (slow deterioration), on Aricept & Namenda- tol well; she feels stable, still drives and hasn't gotten lost...    She saw CARDS- DrNelson 07/23/17>  HBP, PAF & converted spont w/o known recurrence & no changes made...     BP controlled on Losartan100, CalanSR240, HCT25; BP= 130/68 and she denies HA, visual sx, CP, palpit, SOB, edema...    Hx transient PAF, holding NSR on coumadin follwed in CC...    Chol controlled on Atorva10,+ diet;  FLP 8/18 looks good, continue same...     Similarly BS's have remained  acceptable on diet alone... EXAM shows Afeb, VSS, O2sat=97%;  HEENT- neg, mallampati1;  Chest- clear w/o w/r/r;  Heart- RR w/o m/r/g;  Abd- soft, nontender, neg;  Ext- +crepitus in right knee, w/o c/c/e;  Neuro- early dementia, no focal neuro changes...  IMP/PLAN>>  Cynthia Mathis is stable on her current regimen & we reviewed her prob list & meds- all questions answered;  We also gave her the Prevnar-13 pneumonia shot today & this gets her up to date...       Note:  >50% of this 2527mrov was spent in counseling & coordination of care...            Problem List:  MACULAR DEGENERATION (ICD-362.50) - eval by DrHecker w/ foveal telangiectasia, macular drusen, macular degeneration, and cataracts- being followed regularly... vision is preserved so far... ~  11/11:  f/u DrHecker- stable age related mac degen, no retinopathy. ~  11/12:  f/u DrHecker- has advanced bilat AMD, sent to DrRSpringfielde don't have his notes... ~  She has been getting shots in the eyes for macular degen, we do not have notes from DrREl Moro ~  2/16: she tells me that she continues to receive the shots; she is also sched for bilat cat surg by DrHecker... ~  She continues to f/u w/ DrHecker- s/p bilat cats, glaucoma suspect, AMD; last note 12/17 reviewed;  She also sees DrRankin but we do not have notes from him...  HYPERTENSION (ICD-401.9) >>  ~  on VERAPAMIL 240m39m  TOPROL-XL 25mg74m HCTZ 25mg/67m Prev on Lisinopril but this was stopped due to throat clearing tic & mild cough, plus K=6.0 at the time (all improved off the ACE)... ~  8/10: borderline BP here and she will monitor at home carefully & call if not <150/90. ~  7/11: she reports that she incr the Lisinopril to 1&1/2 tabs due to BP~150+, now better... ~  7/12:  BP stable on Verap240 & Lisinopril 30mg/d62m= 140/68 today> denies HA, fatigue, visual changes, CP, palipit, dizziness, syncope, dyspnea, edema, etc... ~  1/13:  BP= 160/90 (didn't take med today); states BP  are ok at home; continue same meds +  low sodium etc... ~  CXR 1/13 showed norm heart size, clear lungs, mild thor spondylosis, NAD.[name]. ~  7/13:  BP= 152/82 & she notes some troublesome throat clearing & mild cough; we decided to STOP the ACE & switch to LOSARTAN 169m/d, monitor her symptoms and her blood presure... ~  CXR 9/13 showed calcif Ao, clear lungs, suspect left thyroid enlargement, DJD in Tspine... ~  EKG 10/13 showed SBrady, rate58, wnl, NAD... ~  11/13: she had long convoluted hx of BP med changes- see above- now on Verap240, MetopER25, HCT25, & BP= 126/72; we discussed options but she is content to continue these same meds for now & watch BP... ~  3/14: on ASA81, Metop25, Verap240, HCT25; BP= 140/80 & she denies HA, CP, palpit, dizzy, SOB, edema, etc. ~  1/15: on ASA81, Metop25, Verap240, HCT25; BP= 140/82 & she remains essen asymptomatic; she is bradycardic & we decided to stop ToprolXL & replace it w/ [id number]~  CXR 1/15 showed heart at upper lim of norm, mild dev of trach to right, clear lungs, NAD...  ~  EKG 1/15 showed SBrady, rate44, NSSTTWA, NAD..[name] ~  7/15: on ASA81, Diovan160, Verap240, HCT25; BP= 120/60 & she is essentially asymptomatic, pulse=60, continue same meds... ~  2/16: on same meds & BP= 128/76, she remains asymptomatic... ~  BP controlled on Diovan160, Verapamil240, HCT25; they monitor BP at home & at the [facility]...  PAF >> she had bout of palpit & went to the ER 06/2016 found to be in AFib w/ rvr => spont converted to NSR w/o known recurrence; started on Eliquis2.5Bid & set up to see CARDS. ~  08/2016> she was eval by DrNelson-- changed to Coumadin due to $$ issues and 2DEcho showed mod focal basilar hypertrophy, norm sys function w/ EF=60-65%, no regional wall motion abn, Gr1DD, AoV mildly thickened calcif leaflets- no AS, mild AI; norm MV, normal LA at 390m norm RV, PAsys=33...  DYSLIPIDEMIA (ICD-272.4) - on ZOCOR 2021m... plus doing well on diet... ~  FLPOld Field09 showed  TChol 144, TG 140, HDL 39, LDL 77... continue same. ~  FLP 2/10 showed TChol 164, TG 77, HDL 54, LDL 95 ~  FLP 8/10 showed TChol 138, TG 51, HDL 48, LDL 80 ~  FLP 1/11 showed TChol 155, TG 100, HDL 48, LDL 87 ~  FLP 7/11 showed TChol 158, TG 74, HDL 55, LDL 89 ~  FLP 1/12 showed TChol 155, TG 101, HDL 50, LDL 85 ~  FLP 1/13 on Simva20 showed TChol 169, TG 118, HDL 56, LDL 89  ~  FLP 1/15 on Simva20 showed TChol 155, TG 122, HDL 50, LDL 81 ~  FLP 2/16 on Simva20 showed TChol 161, TG 107, HDL 61, LDL 79; continue same meds. ~  She wanted off Simvastatin & was switched to Atorva10 ~  FLPMillington18 on Atorva10 showed   Hx Impaired Fasting Glucose >> on diet alone... FBS @ home betw 120-130 recently... she is on a good diet and weight is down at 139# today (63"  BMI= 24)... ~  labs 3/09 showed FBS= 119... subseq HgA1c 5/09 was 6.4... diet Rx. ~  labs 8/09 showed BS= 106, HgA1c= 5.9... Marland KitchenMarland Kitchenc- same. ~  labs 2/10 showed BS= 102, A1c= 5.9 ~  labs 1/11 showed BS= 98 ~  labs 1/12 showed BS= 95 ~  Labs 1/13 showed BS= 101 ~  Labs 3/14 showed BS=106, A1c=6.2 ~  Labs 1/15 showed BS= 103 ~  Labs 2/16 showed FBS= 99  HYPOTHYROIDISM (ICD-244.9) - off Synthroid now...  clinically & biochemically euthyroid... remote hx of overactive thyroid many yrs ago in [place] (we don't have records)... she had an eval by DrKohut in 1987 w/ familial multinodular goiter w/ neg FNA... started on Rx but goiter enlarged- had thyroidectomy 1990 by DrWeatherly... ~  labs 3/09 showed TSH = 0.35 (0.35-5.5)... we have slowly decr her dose to 49mg/d... ~  labs 8/09 showed TSH= 0.33... rec- decr the Synth50 to 1/2 tab daily. ~  labs 2/10 showed =TSH= 0.54 and Synthroid stopped... ~  labs 4/10 off med showed TSH= 0.59 ~  labs 8/10 showed TSH= 0.60 ~  labs 1/11 showed TSH= 0.60 ~  labs 7/11 showed TSH= 0.56 ~  labs 1/12 showed TSH= 0.60 ~  Labs 1/13 showed TSH= 0.58 ~  Labs 3/14 showed TSH= 0.37 ~  Labs 1/15 showed TSH= 0.59 ~  Labs  2/16 showed TSH= 0.76  DIVERTICULOSIS OF COLON (ICD-562.10) COLONIC POLYPS (ICD-211.3) - followed by DrPerry... bout of diverticulitis in Mar08... ~  last colonoscopy 3/08 showed divertics, 23mpolyp, hems... path=hyperplastic, f/u planned 5y44yr~  F/u colonoscopy 4/13 by DrPerry showed severe diverticulosis w/o inflamm and w/o polyps etc... ~  2016:  CT Abd 09/2014 w/ severe diverticulosis & mild acute sigmoid diverticulitis- treated w/ Cipro/Flagyl & resolved; f/u colonoscopy 5/16 showed severe diverticulosis w/o inflamm or polyps seen, advised Miralax etc...  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  HEMATURIA, HX OF (ICD-V13.09) - eval 4/08 by DrGrapey w/ neg Sonar, Cysto, cytology...  [facility] >> [facility]KRichardson[facility])...  DEGENERATIVE JOINT DISEASE (ICD-715.90) - c/o discomfort in her shoulders and knees... uses OTC meds as needed. ~  04/2015:  She presented w/ 1wk hx right knee & LBP; exam showed crepitation in right knee but good ROM & SLR was neg; Rec to use Tramadol+Tylenol, neoprene support sleeve for knee & rest/heat/ exercises for back; we will refer to Ortho for shots if not responding...   Hx of OSTEOPOROSIS (ICD-733.00) - on Fosamax 70/wk thru 7/11, Caltrate 1/d, MVI, Vit D 1000/d... ~  initial BMD 10/01 showed TScores -0.2  to -3.2 in the spine...  ~  f/u studies w/ steady improvement on Fosamax therapy... ~  BMD 3/09 w/ TScores -0.1 to -1.7 in the spine... continue Rx. ~  BMD 7/11 showed TScores -2.0 Spine, and -0.3 in right FemNeck... we opted for Bisphos drug holiday to start now. ~  Labs 1/13 showed Vit D level = 48, continue same... ~  BMD 2/15 showed WNL Tscores; rec to continue calcium, MVI, VitD, wt bearing exercise...  PROGRESSIVE  MEMORY LOSS >>  ~  MMSE 5/09 by TPaDoran HeaterP was 29/30... Reassured. ~  3/14: she presented w/ c/o worsening memory & MMSE has diminished- refer to [facility] eval & rx... ~  Neuro eval 4/14 by DrPenumalli> mild cognitive impairment vs norm aging, he did not rec starting meds... ~  CT Brain 4/14 showed mild atrophy & sm vessel dis ~  She continues to f/u w/ Neurology periodically... ~  1/15: ROV here & husb more concerned re her memory, recall was 0/3 after distraction; husb has apparently done well on Aricept & she wants trial, start ARICEPT5=>10 daily... ~  [facility]  10Bid to her Aricept10...  ANXIETY (ICD-300.00) - prev on Alpraz Prn but hasn't used in yrs...  DERMATOLOGY >> LIPOMA (ICD-214.9) - notes large lipoma in RLQ/ rt flank area... some discomfort noted... seen by DrLeone in the past w/o surg... she saw DrCornett, CCS, 1/11 for second opinion & she will decide re: excision... ~  5/13:  She had Basal cell ca removed from the left side of her nose, w/ subseq deeper margins & reports everything is OK... ~  7/13:  She is concerned that the lipoma may be growing & she requests f/u DrCornett for excision... ~  10/13:  Lipoma removed by DrCornett in outpt surg 04/17/12...  SHINGLES - remote hx of right T8-9 shingles in 1986...   Past Surgical History:  Procedure Laterality Date  . APPENDECTOMY  1947  . COLONOSCOPY    . MASS EXCISION  04/17/2012   Procedure: EXCISION MASS;  Surgeon: [name], MD;  Location: [facility];  Service: General;  Laterality: N/A;  . THYROIDECTOMY  1990   [name]    Outpatient Encounter Medications as of 08/15/2017  Medication Sig  . atorvastatin (LIPITOR) 10 MG tablet Take 1 tablet (10 mg total) by mouth daily.  . Calcium Carbonate-Vitamin D (CALCIUM 600+D) 600-400 MG-UNIT per tablet Take 1 tablet by mouth daily.  . Cyanocobalamin (VITAMIN B 12 PO) Take 1 tablet by mouth  daily. Reported on 12/27/2015  . dicyclomine (BENTYL) 10 MG capsule Take 1 capsule (10 mg total) by mouth 4 (four) times daily -  before meals and at bedtime.  . donepezil (ARICEPT) 10 MG tablet TAKE ONE TABLET BY MOUTH EVERY NIGHT AT BEDTIME  . fish oil-omega-3 fatty acids 1000 MG capsule Take 1 g by mouth daily.  [name] glucosamine-chondroitin 500-400 MG tablet Take 1 tablet by mouth 3 (three) times daily.  . hydrochlorothiazide (HYDRODIURIL) 25 MG tablet Take 1 tablet (25 mg total) by mouth daily.  [name] losartan (COZAAR) 100 MG tablet TAKE ONE TABLET BY MOUTH DAILY  . memantine (NAMENDA) 10 MG tablet TAKE ONE TABLET BY MOUTH TWICE A DAY  . Multiple Vitamins-Minerals (MULTIVITAMIN & MINERAL PO) Take 1 tablet by mouth daily.  . verapamil (CALAN-SR) 240 MG CR tablet Take 1 tablet (240 mg total) by mouth at bedtime.  [name] warfarin (COUMADIN) 5 MG tablet TAKE AS DIRECTED BY COUMADIN CLINIC  . [DISCONTINUED] valsartan (DIOVAN) 160 MG tablet TAKE 1 TABLET (160 MG TOTAL) BY MOUTH DAILY.   No facility-administered encounter medications on file as of 08/15/2017.     Allergies  Allergen Reactions  . Epinephrine Palpitations    REACTION: heart racing    Immunization History  Administered Date(s) Administered  . DTP 04/17/2000  . Influenza Split 05/20/2011, 05/15/2012  . Influenza Whole 05/02/2010  . Influenza, High Dose Seasonal PF 05/08/2013, 05/25/2017  . Influenza,inj,Quad PF,6+ Mos 05/25/2014, 06/29/2015, 06/13/2016  . Pneumococcal Conjugate-13 08/15/2017  . Pneumococcal Polysaccharide-23 04/17/2000  . Tdap 08/04/2013    Current Medications, Allergies, Past Medical History, Past Surgical History, Family History, and Social History were reviewed in [employer] record.    Review of Systems        See HPI - all other systems neg except as noted...  The patient complains of dyspnea on exertion.  The patient denies anorexia, fever, weight loss, weight gain, vision loss,  decreased hearing, hoarseness, chest pain, syncope, peripheral edema, prolonged cough, headaches, hemoptysis, abdominal pain, melena, hematochezia, severe indigestion/heartburn, hematuria, incontinence, muscle weakness, suspicious skin lesions,  transient blindness, difficulty walking, depression, unusual weight change, abnormal bleeding, enlarged lymph nodes, and angioedema.   Objective:   Physical Exam    WD, WN, 84 y/o WF in NAD... VITAL SIGNS:  Reviewed... GENERAL:  Alert & oriented; pleasant & cooperative... HEENT:  [place]/AT, EOM-wnl, PERRLA, EACs-clear, TMs-wnl, NOSE-clear, THROAT-clear & wnl. NECK:  Supple w/ fair ROM; no JVD; normal carotid impulses w/o bruits; scar of prev thyroid surg- no nodules or goiter; no lymphadenopathy. CHEST:  Clear to P & A; without wheezes/ rales/ or rhonchi. HEART:  Regular Rhythm; without murmurs/ rubs/ or gallops. ABDOMEN:  Soft & nontender; normal bowel sounds; no organomegaly or masses detected. EXT: without deformities, mild arthritic changes; no varicose veins/ venous insuffic/ or edema. prob heel spurs w/ some plantar fascia pain> advised soaks & stretching... NEURO:  CN's intact;  no focal neuro deficits... DERM:  Scar in right flank after lipoma excision... no other lesions noted...  MMSE 09/17/12>  She did not know the Pres ("I can see him, a black guy"), VP, or Gov;  Didn't know prev pres etc;  Recall 3/3 immed, but only 1/3 after distraction;  Serial 7s & WORLD backwards were fluent; proverbs were abstracted normally...  RADIOLOGY DATA:  Reviewed in the EPIC EMR & discussed w/ the patient...  LABORATORY DATA:  Reviewed in the EPIC EMR & discussed w/ the patient...   Assessment & Plan:    02/12/17>   Cynthia Mathis feels that she is doing well- no new complaints or concerns;  She requests refill Rxs for 90d supplies;  She will ret for FASTING blood work & continue her protime checks in the coumadin clinic... We plan rov in 39mo [place] if needed for  problems. 08/15/17>   Cynthia Mathis is stable on her current regimen & we reviewed her prob list & meds- all questions answered;  We also gave her the Prevnar-13 pneumonia shot today & this gets her up to date...        Macular Degen>  Followed by DrHecker/ [place] & getting MD shots...  HBP>  on Verap, Diovan, HCT- along w/ no salt; & BP appears to be well controlled & pulse improved (60/min)  ATRIAL FLUTTER EPISODE 06/2016 => spont converted, started on Eliquis2.5Bid & set up to see CARDS...  DYSLIPIDEMIA>  Stable on diet & Simva20...  Borderline DM>  Hx BS in the 120-130 range yrs ago but stable ~100 for the last 3+yrs on diet alone...  Hx Hypothyroidism>  She weaned off Synthroid several yrs ago & TSH has remained normal, clinically euthyroid as well...  GI>  Divertics, Colon Polyps>  Stable & up to date w/ f/u colon 2016 from DrPerry==> divertics, no polyps...  LIPOMA>  Removed 10/13 from RLQ area by DrCornett...  DJD>  She notes some right knee discomfort playing "pickle ball" & is advised to wear knee brace, take Aleve prn (she declines further eval or prescription meds)... 10/16>  1 wk hx right knee & LBP; exam shows good ROM right knee w/ crepitation; XRays indicate degen arthritis; we discussed Rx w/ Tramadol+Tylenol, neoprene sleeve for knee and rest/ heat for back; I indicated next step is to refer to Ortho for shot if symptoms don't respond to Rx.  Osteoporosis>  Hx BMD in 2001 w/ TScore =3.2 in Spine; Rx'd Fosamax for 117yrw/ steady improvement & on Bisphos drug holiday since 7/11 & BMD is 2015 was WNL- continue supplements w/ Calcium, MVI, Vit D...  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  MEMORY LOSS>  Followed by for mild dementia w/ MMSE 26/30- DrPenumalli on Aricept & Namenda...  Other medical problems as listed...   Patient's Medications  New Prescriptions   No  medications on file  Previous Medications   ATORVASTATIN (LIPITOR) 10 MG TABLET    Take 1 tablet (10 mg total) by mouth daily.   CALCIUM CARBONATE-VITAMIN D (CALCIUM 600+D) 600-400 MG-UNIT PER TABLET    Take 1 tablet by mouth daily.   CYANOCOBALAMIN (VITAMIN B 12 PO)    Take 1 tablet by mouth daily. Reported on 12/27/2015   DICYCLOMINE (BENTYL) 10 MG CAPSULE    Take 1 capsule (10 mg total) by mouth 4 (four) times daily -  before meals and at bedtime.   DONEPEZIL (ARICEPT) 10 MG TABLET    TAKE ONE TABLET BY MOUTH EVERY NIGHT AT BEDTIME   FISH OIL-OMEGA-3 FATTY ACIDS 1000 MG CAPSULE    Take 1 g by mouth daily.   GLUCOSAMINE-CHONDROITIN 500-400 MG TABLET    Take 1 tablet by mouth 3 (three) times daily.   HYDROCHLOROTHIAZIDE (HYDRODIURIL) 25 MG TABLET    Take 1 tablet (25 mg total) by mouth daily.   LOSARTAN (COZAAR) 100 MG TABLET    TAKE ONE TABLET BY MOUTH DAILY   MEMANTINE (NAMENDA) 10 MG TABLET    TAKE ONE TABLET BY MOUTH TWICE A DAY   MULTIPLE VITAMINS-MINERALS (MULTIVITAMIN & MINERAL PO)    Take 1 tablet by mouth daily.   VERAPAMIL (CALAN-SR) 240 MG CR TABLET    Take 1 tablet (240 mg total) by mouth at bedtime.   WARFARIN (COUMADIN) 5 MG TABLET    TAKE AS DIRECTED BY COUMADIN CLINIC  Modified Medications   No medications on file  Discontinued Medications   VALSARTAN (DIOVAN) 160 MG TABLET    TAKE 1 TABLET (160 MG TOTAL) BY MOUTH DAILY.

## 2017-08-22 ENCOUNTER — Ambulatory Visit (INDEPENDENT_AMBULATORY_CARE_PROVIDER_SITE_OTHER): Payer: Medicare Other | Admitting: *Deleted

## 2017-08-22 DIAGNOSIS — Z5181 Encounter for therapeutic drug level monitoring: Secondary | ICD-10-CM | POA: Diagnosis not present

## 2017-08-22 DIAGNOSIS — I480 Paroxysmal atrial fibrillation: Secondary | ICD-10-CM

## 2017-08-22 LAB — POCT INR: INR: 3.4

## 2017-08-22 NOTE — Patient Instructions (Signed)
Description   Do not take coumadin today Feb 13th then continue taking 1/2 tablet daily except 1 tablet on Mondays and Fridays.  Recheck INR in 2 weeks. Call with any medication changes or if scheduled for any procedures [phone].

## 2017-09-05 ENCOUNTER — Ambulatory Visit (INDEPENDENT_AMBULATORY_CARE_PROVIDER_SITE_OTHER): Payer: Medicare Other | Admitting: *Deleted

## 2017-09-05 DIAGNOSIS — Z5181 Encounter for therapeutic drug level monitoring: Secondary | ICD-10-CM

## 2017-09-05 DIAGNOSIS — I480 Paroxysmal atrial fibrillation: Secondary | ICD-10-CM

## 2017-09-05 LAB — POCT INR: INR: 3.8

## 2017-09-05 NOTE — Patient Instructions (Signed)
Description   Skip today's dose, then start taking 1/2 tablet everyday.   Recheck INR in 2 weeks. Call with any medication changes or if scheduled for any procedures [phone].

## 2017-09-17 DIAGNOSIS — H353211 Exudative age-related macular degeneration, right eye, with active choroidal neovascularization: Secondary | ICD-10-CM | POA: Diagnosis not present

## 2017-09-17 DIAGNOSIS — H26492 Other secondary cataract, left eye: Secondary | ICD-10-CM | POA: Diagnosis not present

## 2017-09-17 DIAGNOSIS — H3562 Retinal hemorrhage, left eye: Secondary | ICD-10-CM | POA: Diagnosis not present

## 2017-09-17 DIAGNOSIS — H353221 Exudative age-related macular degeneration, left eye, with active choroidal neovascularization: Secondary | ICD-10-CM | POA: Diagnosis not present

## 2017-09-19 ENCOUNTER — Ambulatory Visit (INDEPENDENT_AMBULATORY_CARE_PROVIDER_SITE_OTHER): Payer: Medicare Other | Admitting: *Deleted

## 2017-09-19 DIAGNOSIS — I480 Paroxysmal atrial fibrillation: Secondary | ICD-10-CM | POA: Diagnosis not present

## 2017-09-19 DIAGNOSIS — Z5181 Encounter for therapeutic drug level monitoring: Secondary | ICD-10-CM | POA: Diagnosis not present

## 2017-09-19 LAB — POCT INR: INR: 1.7

## 2017-09-21 DIAGNOSIS — H353211 Exudative age-related macular degeneration, right eye, with active choroidal neovascularization: Secondary | ICD-10-CM | POA: Diagnosis not present

## 2017-10-03 ENCOUNTER — Ambulatory Visit (INDEPENDENT_AMBULATORY_CARE_PROVIDER_SITE_OTHER): Payer: Medicare Other | Admitting: *Deleted

## 2017-10-03 DIAGNOSIS — I480 Paroxysmal atrial fibrillation: Secondary | ICD-10-CM | POA: Diagnosis not present

## 2017-10-03 DIAGNOSIS — Z5181 Encounter for therapeutic drug level monitoring: Secondary | ICD-10-CM

## 2017-10-03 LAB — POCT INR: INR: 1.9

## 2017-10-03 NOTE — Patient Instructions (Addendum)
Description   Today take 1.5 tablets, then change your dose to 1/2 tablet everyday except 1 tablet on Sundays and Wednesdays.  Recheck INR in 9 days prior to going out of town. Call with any medication changes or if scheduled for any procedures [phone].

## 2017-10-08 ENCOUNTER — Other Ambulatory Visit: Payer: Self-pay | Admitting: Cardiology

## 2017-10-08 DIAGNOSIS — I480 Paroxysmal atrial fibrillation: Secondary | ICD-10-CM

## 2017-10-12 ENCOUNTER — Ambulatory Visit: Payer: Medicare Other | Admitting: *Deleted

## 2017-10-12 DIAGNOSIS — Z5181 Encounter for therapeutic drug level monitoring: Secondary | ICD-10-CM | POA: Diagnosis not present

## 2017-10-12 DIAGNOSIS — I480 Paroxysmal atrial fibrillation: Secondary | ICD-10-CM

## 2017-10-12 LAB — POCT INR: INR: 2.4

## 2017-10-12 NOTE — Patient Instructions (Signed)
Description   Continue same dose of coumadin  1/2 tablet everyday except 1 tablet on Sundays and Wednesdays.  Recheck INR in 2 weeks. Call with any medication changes or if scheduled for any procedures [phone].

## 2017-10-26 ENCOUNTER — Other Ambulatory Visit: Payer: Self-pay | Admitting: Pulmonary Disease

## 2017-10-29 ENCOUNTER — Ambulatory Visit: Payer: Medicare Other | Admitting: *Deleted

## 2017-10-29 DIAGNOSIS — I480 Paroxysmal atrial fibrillation: Secondary | ICD-10-CM

## 2017-10-29 DIAGNOSIS — Z5181 Encounter for therapeutic drug level monitoring: Secondary | ICD-10-CM

## 2017-10-29 LAB — POCT INR: INR: 2.9

## 2017-10-29 NOTE — Patient Instructions (Signed)
Description   Continue same dose of coumadin  1/2 tablet everyday except 1 tablet on Sundays and Wednesdays.  Recheck INR in 4 weeks. Call with any medication changes or if scheduled for any procedures [phone].

## 2017-10-30 ENCOUNTER — Telehealth: Payer: Self-pay | Admitting: Pulmonary Disease

## 2017-10-30 NOTE — Telephone Encounter (Signed)
Spoke with pt's spouse Mariane Masters (dpr on file), states that [facility] called pt to let them know that the losartan and hctc rx has been recalled.  Pt's chart shows that she takes losartan and hctz as 2 separate medications. Called [facility] for clarification- [facility] pharmacist Webb Silversmith says that no medications from their pharmacy have been recalled.    Called pt's spouse back to make aware.  Nothing further needed.

## 2017-11-07 DIAGNOSIS — H353133 Nonexudative age-related macular degeneration, bilateral, advanced atrophic without subfoveal involvement: Secondary | ICD-10-CM | POA: Diagnosis not present

## 2017-11-07 DIAGNOSIS — H353211 Exudative age-related macular degeneration, right eye, with active choroidal neovascularization: Secondary | ICD-10-CM | POA: Diagnosis not present

## 2017-11-07 DIAGNOSIS — H353221 Exudative age-related macular degeneration, left eye, with active choroidal neovascularization: Secondary | ICD-10-CM | POA: Diagnosis not present

## 2017-11-08 DIAGNOSIS — H353133 Nonexudative age-related macular degeneration, bilateral, advanced atrophic without subfoveal involvement: Secondary | ICD-10-CM | POA: Diagnosis not present

## 2017-11-08 DIAGNOSIS — H353211 Exudative age-related macular degeneration, right eye, with active choroidal neovascularization: Secondary | ICD-10-CM | POA: Diagnosis not present

## 2017-11-08 DIAGNOSIS — H35351 Cystoid macular degeneration, right eye: Secondary | ICD-10-CM | POA: Diagnosis not present

## 2017-11-26 ENCOUNTER — Ambulatory Visit: Payer: Medicare Other | Admitting: *Deleted

## 2017-11-26 DIAGNOSIS — Z5181 Encounter for therapeutic drug level monitoring: Secondary | ICD-10-CM | POA: Diagnosis not present

## 2017-11-26 DIAGNOSIS — I480 Paroxysmal atrial fibrillation: Secondary | ICD-10-CM

## 2017-11-26 LAB — POCT INR: INR: 1.6

## 2017-11-26 NOTE — Patient Instructions (Signed)
Description   Today take 1 tablet, then Continue same dose of coumadin  1/2 tablet everyday except 1 tablet on Sundays and Wednesdays.  Recheck INR in 2 weeks. Call with any medication changes or if scheduled for any procedures [phone].

## 2017-12-05 ENCOUNTER — Encounter: Payer: Self-pay | Admitting: Pulmonary Disease

## 2017-12-05 ENCOUNTER — Ambulatory Visit: Payer: Medicare Other | Admitting: Pulmonary Disease

## 2017-12-05 ENCOUNTER — Ambulatory Visit (INDEPENDENT_AMBULATORY_CARE_PROVIDER_SITE_OTHER)
Admission: RE | Admit: 2017-12-05 | Discharge: 2017-12-05 | Disposition: A | Discharge status: Home / Self Care | Payer: Medicare Other | Source: Ambulatory Visit | Attending: Pulmonary Disease | Admitting: Pulmonary Disease

## 2017-12-05 VITALS — BP 142/74 | HR 57 | Temp 97.9°F | Ht 63.0 in | Wt 144.0 lb

## 2017-12-05 DIAGNOSIS — I480 Paroxysmal atrial fibrillation: Secondary | ICD-10-CM

## 2017-12-05 DIAGNOSIS — I10 Essential (primary) hypertension: Secondary | ICD-10-CM

## 2017-12-05 DIAGNOSIS — M159 Polyosteoarthritis, unspecified: Secondary | ICD-10-CM

## 2017-12-05 DIAGNOSIS — M79671 Pain in right foot: Secondary | ICD-10-CM

## 2017-12-05 DIAGNOSIS — M8580 Other specified disorders of bone density and structure, unspecified site: Secondary | ICD-10-CM

## 2017-12-05 DIAGNOSIS — R413 Other amnesia: Secondary | ICD-10-CM

## 2017-12-05 DIAGNOSIS — M150 Primary generalized (osteo)arthritis: Secondary | ICD-10-CM | POA: Diagnosis not present

## 2017-12-05 NOTE — Progress Notes (Signed)
Subjective:    Patient ID: Cynthia Mathis, female    DOB: [birth year], 84 y.o.   MRN: [id number]  HPI 84 y/o WF here for a follow up visit... she has mult med problems as noted below ...  ~  SEE PREV EPIC NOTES FOR OLDER DATA >>   ~  sees Cynthia Mathis for GI- divertics, polyps, hems & a bout of diverticulitis in Mar08...  ~  sees Cynthia Mathis Urology w/ microscopic hematuria and a neg eval 5/08... ~  sees Cynthia Mathis for Cynthia Mathis w/ macular degen- notes reviewed... ~  sees Cynthia Mathis for Derm w/ alopecia- offered Rogaine but decided against it... ~  Sees Cynthia Mathis for Neuro w/ memory loss, early stage dementia...   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  September 17, 2012:  concerned about her memory; husb has been under the care of Cynthia Mathis for >75yr w/ mild cognitive impairment  LABS 3/14:  Chems- wnl w/ BS=106, A1c=6.2;  TSH=0.37;  Sed=14...    January 23, 2013:  visit w/ Cynthia Mathis 3/14> her MMSE was 29/30 & he planned to check MRI Brain & B12 level, he did not rec starting meds yet (husb takes Razadyne & feels it helps him).  ADDENDUM 04/15/13 >> pt called & wants off Simva20 after reading something about memory loss=> change to Atorva20 (she never did).  CXR 1/15 showed heart at upper lim of norm, lungs clear (mild dev trachea to right c/w enlarged left lobe of thyroid- no change).  EKG 1/15 showed SBrady, rate44, NSSTTW changes.  LABS 1/15:  FLP- at goals on Simva20;  Chems- wnl;  CBC- wnl;  TSH=0.59;  VitD=54.  BMD 08/22/13>  Lowest Tscore= -0.9 in APspine... She is off bisphos therapy for several yrs now & remains improved...   NOTE:  She wants to try ARICEPT10 start 1/2 => 1 tab daily as trial; Pulse is low at 48 & we decided to stop ToprolXL & replace it w/ VALSARTAN160/d.  LABS, Mammogram, CXR, BMD, EKG> all reviewed ...  ~  [id number]  it took quite a bit of time getting her med  list straight- she did not know her meds, her card w/ meds written on it was old & out of date, Epic meds were not up to date either & we called Pharm- pt is reminded to bring all med bottles to every office visit..Marland KitchenMarland Kitchenhe has mult minor somatic complaints...  LABS 2/16:  FLP- at goals on Simva20;  Chems- wnl;  CBC- wnl;  TSH=0.76... ~  [id number]  Cynthia Mathis has mult somatic complaints> runny nose (offered Zyrtek & Astelin but she doesn't want more meds), cold feet, hot & sweaty, thirsty, right knee pain, stepped on a hornets nest w/ 12 stings and reaction; she also reported an episode of syncope [place] she was taken to daughter's doctor & reports that everything checked out OK- we do not have the data... She remains very active & plays "pickle ball" everyday.  CT Abd 10/01/14>  Severe diverticulosis & mild acute sigm diverticulitis + inflammed distal rectum/anus c/w acute proctitis; 177msolitary cystic lesion in body of pancreas, gallstones and atherosclerotic changes in Ao...   ~  May 06, 2015:  23m55moV & add-on appt requested by pt due to right knee pain and LBP for the last 1wk she says; no known trauma or injury, and she denies prev problems x occas soreness; she thinks that she  may have had a shot in her knee once several yrs ago; as for the back pain- it is dull, in center of back w/o radiation...     EXAM shows Afeb, VSS, O2sat=97%;  HEENT- neg, mallampati1;  Chest- clear w/o w/r/r;  Heart- RR w/o m/r/g;  Abd- soft, nontender, neg;  Ext- +crepitus in right knee, w/o c/c/e;  Neuro- intact...   XRay right knee 05/06/15> osteoarthtitis w/ medial joint space compartment narrowing with milder patellofemoral joint space compartment narrowing. Marginal osteophytes project from all 3 compartments, mild spurring along the tibial spine, no joint effusion; Bones are demineralized...  XRay LS spine 05/06/15>  Disc space narrowing at L2-3 with endplate sclerosis. Mild osteophytic  changes are seen. Diffuse aortic calcifications are noted. NAD... IMP/PLAN>>  Cynthia Mathis is c/o 1 wk hx right knee & LBP; exam shows good ROM right knee w/ crepitation; XRays indicate degen arthritis; we discussed Rx w/ Tramadol+Tylenol, neoprene sleeve for knee and rest/ heat for back; I indicated next step is to refer to Ortho for shot if symptoms don't respond to Rx...   ~  August 11, 2015:  3moROV & [name] is stable- c/o runny nose, LBP, shoulder pain, right knee pain; she has tried Tramadol w/o much benefit & we discussed adding topical heat, ParafonTid 7 refer to Ortho for further eval... We reviewed the following medical problems during today's office visit >>     Macular Degen> followed by Cynthia Mathis & Cynthia Mathis; she received shots MD & had bilat cat surg but we do not have notes from them...    Hearing Loss> she saw Cynthia Mathis, ENT w/ audiology showing bilat SNHL...    HBP> on ASA81, Valsartan160, Verap240, HCT25; BP= 142/68 & she denies HA, CP, palpit, dizzy, SOB, edema, etc...    Hyperlipid> on Simva20; FLP 2/17 show TChol 155, TG 71, HDL 60, LDL 80; continue same meds...    Hx Impaired FBS> Hx BS in the 130 range prev, but all readings ~100 x yrs now;  Labs 2/17 showed BS=94    Hypothy> hx multinod thyroid x yrs; had thyroidectomy 1990 by Cynthia Mathis; she weaned off Thyroid hormone yrs ago & has remained clinically & biochem euthyroid; Labs 2/17 showed TSH= 0.68    GI- Divertics, Polyps> hx diverticulitis in 2008- followed by Cynthia Mathis; last colonoscopy 5/16 showed severe diverticulosis w/o inflamm or polyps seen...    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 Labs 2/17 showed Hg= 14.6 & prev LFTs are WNL.    DJD, Osteoporosis> on Ca, MVI, Glucosamine; she uses OTC analgesic meds as needed; see BMDs below- on Bisphos drug holiday since 7/11 & due for f/u BMD... Labs 2/17 w/ VitD= 44    Memory Loss> eval by  [facility] 4/14, Cynthia Mathis; felt to have mild cognitive impairment vs normal aging, then w/ progressive impairment & they have her on Donepezil10 & Namenda10Bid now... EXAM shows Afeb, VSS, O2sat=98%;  HEENT- neg, mallampati1;  Chest- clear w/o w/r/r;  Heart- RR w/o m/r/g;  Abd- soft, nontender, neg;  Ext- +crepitus in right knee, w/o c/c/e;  Neuro- intact...   LABS 08/11/15>  FLP- all parameters at goals on Simva20;  Chems- wnl;  CBC- wnl;  TSH=0.68;  VitD=44...  IMP/PLAN>>  Cynthia Mathis is stable- we discussed trying heat, Tramadol, Parafon for her arthritic & LBP complaints; we will refer to Ortho; rec routine ROV in 639mo.   ~  February 09, 2016:  9m71mo  ROV & Cynthia Mathis is stable overall but persists w/ mult somatic complaints like her runny nose (Astelin rx- offered alternative but she does not want new meds); she still has LBP, shoulder pain, knee pain- on Parafon, Tramadol, & heat but she did not go to Ortho for further eval as rec last visit; she is also c/o difficulty hearing & has hearing aides [facility] but says it isn't right- offered 2nd opinion from ENT & she will let me know;  She also c/o noct leg cramps- we reviewed the usual recs for tonic water vs yellow mustard;  Finally she is also c/o visual difficulties- she had cat surg by Cynthia Mathis, and is followed by retina specialist Cynthia Mathis w/ macular degen s/p shots...    Macular Degen> followed by Cynthia Mathis & Cynthia Mathis; she received shots for MD & had bilat cat surg but we do not have notes from them...    Hearing Loss> she saw Cynthia Mathis, ENT w/ audiology showing bilat SNHL, she has bilat hearing aides but notes that they are not right...    HBP> on ASA81, Valsartan160, Verap240, HCT25; BP= 132/70 & she denies HA, CP, palpit, dizzy, SOB, edema, etc...    Hyperlipid> on Simva20; FLP 2/17 show TChol 155, TG 71, HDL 60, LDL 80; continue same meds...    Hx Impaired FBS> Hx BS in the 130 range prev, but all readings ~100 x yrs now on diet alone;  Labs 2/17  showed BS=94    Hypothy> hx multinod thyroid x yrs; had thyroidectomy 1990 by Cynthia Mathis; she weaned off Thyroid hormone yrs ago & has remained clinically & biochem euthyroid; Labs 2/17 showed TSH= 0.68    GI- Divertics, Polyps> hx diverticulitis in 2008- followed by Cynthia Mathis; last CT Abd 09/2014 w/ severe diverticulosis & mild acute sigmoid diverticulitis- treated w/ Cipro/Flagyl & resolved; f/u colonoscopy 5/16 showed severe diverticulosis w/o inflamm or polyps seen, advised Miralax etc...    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 Labs 2/17 showed Hg= 14.6 & prev LFTs are WNL.    GYN- DrRichardson    DJD, Osteoporosis> on Ca, MVI, Glucosamine; she uses OTC analgesic meds as needed; see BMDs below- on Bisphos drug holiday since 7/11 & due for f/u BMD... Labs 2/17 w/ VitD= 44    Memory Loss> eval by [facility] 4/14, Cynthia Mathis & recent f/u 12/2015 (MMSE=26) felt to have mild cognitive impairment vs normal aging, then w/ progressive impairment & they have her on Donepezil10 & Namenda10Bid.  EXAM shows Afeb, VSS, O2sat=98%;  HEENT- neg, mallampati1;  Chest- clear w/o w/r/r;  Heart- RR w/o m/r/g;  Abd- soft, nontender, neg;  Ext- +crepitus in right knee, w/o c/c/e;  Neuro- early dementia, no focal neuro changes...  IMP/PLAN>>  Cynthia Mathis has mult somatic complaints and we discussed each (again) & I wonder about relation to her dementia; she will continue current meds and we reviewed tonic water vs yellow mustard for the noct leg cramps & wrote this down on her AVS; we plan ROV in 24mow/ f/u CXR, EKG, Fasting labs... Note: >50% of this 270m visit was spent in counseling & coordination of care...  ~  August 14, 2016:  32m20moV & Cynthia Mathis seen in the ER 06/29/16 with palpitations> EKG showed Afib w/ rvr 138, then resolved spont in ER to NSR, Exam was otherw unremarkable, CHADsVASC score=5; CXR- NAD and LABS- ok...  She was placed on Eliquis2.5Bid & Propranolol  37m prn palpit & asked to f/u w/ CARDS;  She had f/u appt here w/ PA- Tammy Parrett on 07/14/16> pt reported doing well- no recurrent palpit, no CP/ SOB/ etc, doing well on Eliquis w/o bleeding/ bruising/ etc; f/u EKG showed holding SBrady, rate 54 => she has a CARDS appt w/ DrNelson 08/24/16...    Her CC= constant right knee pain => Rx w/ OTC Advil/ Aleve / Tylenol prn & we will refer to Ortho for further eval... EXAM shows Afeb, VSS, O2sat=98%;  HEENT- neg, mallampati1;  Chest- clear w/o w/r/r;  Heart- RR w/o m/r/g;  Abd- soft, nontender, neg;  Ext- +crepitus in right knee, w/o c/c/e;  Neuro- early dementia, no focal neuro changes...   CXR 06/29/16>  Norm heart size, clear lungs- NAD, thoracic kyphosis & spondylosis...   EKG 06/29/16>  AFlutter w/ variable conduction, HR=137;  Converted spont to NSR rate 67, EKG otherw wnl...  LABS 06/2016 in Epic>  CBC/ Chems- reviewed & ok;  TSH=0.45 IMP/PLAN>>  Cynthia Mathis had episode of AFlutter w/ rvr & converted spont to NSR, started on Eliquis2.5Bid for ChadsVasc scaore of 5; she has yet to see cards but appt w/ DrNelson set for 08/24/16... Her CC is right knee pain & we will send to Ortho for assessmant...  ADDENDUM>>  She saw DrNelson 08/24/16 HBP, HL, palpit=> episode of PAF w/ rvr 06/2016, spont coverted to NSR in ER w/o intervention, started on Eliquis2.5Bid, no recurrent palpit since then; Eliquis was too expensive & she was switched to Coumadin;  EKG showed SBrady, otherw wnl;  2DEcho performed 09/11/16=> mod focal basilar hypertrophy, norm sys function w/ EF=60-65%, no regional wall motion abn, Gr1DD, AoV mildly thickened calcif leaflets- no AS, mild AI; norm MV, normal LA at 365m norm RV, PAsys=33...  ~  Nov 20, 2016:  15m39moV & add-on appt requested for abd discomfort> She called last wk c/o 2wk hx abd discomfort, mid abd area, not feeling well, but denied n/v/d/ blood seen & no f/c/s etc; she declined to be  seen in ER/ urgent care & set up this appt to see me today;  She is followed for GI by Cynthia Mathis w/ hx severe diverticulosis on her last colon 11/2014 and a bout of clinical diverticulitis in 2008 and CT Abd in 09/2014 w/ severe divertics and mild acute sigm diverticulitis treated w/ Flagyl/ Cipro;  SHE FEELS THAT HER SYMPTOMS HAVE IMPROVED OVER THE LAST WEEK ON GLUTEN FREE DIET-- and she tells us Korear the 1st time that several relatives have Celiac disease (her grandmother, her brother, and her son) => we agreed to check Tissue Transglutaminase Antibody test (=> NEG) but her symptoms sound more like IBS-c & we will treat w/ Miralax as rec by Cynthia Mathis in the past, plus Bentyl 51m77md prn for abd discomfort...     She is otherw stable on Coumadin for hx PAF, BP controlled on Verap240, Diovan160, HCT25 and electrolytes are wnl...     She remains on Aricept & Namenda and pt & her husb are doing as well as can be expected given their advanced age & underlying issues... EXAM shows Afeb, VSS, O2sat=95%;  HEENT- neg, mallampati1;  Chest- clear w/o w/r/r;  Heart- RR w/o m/r/g;  Abd- soft, nontender, neg;  Ext- +crepitus in right knee, w/o c/c/e;  Neuro- early dementia, no focal neuro changes...   LABS 11/20/16>  Chems- ok w/ BS=116, Cr=0.92, LFTs- wnl;  CBC- wnl w/ Hg=14.5, wbc=9.9;  Sed rate=6;  Tissue Transglutaminase Ab=  NEG...  IMP/PLAN>>  Cynthia Mathis has known severe diverticulosis, hx hyperplastic polyps, hx hems;  Her symptoms sound like mild IBS & Cynthia Mathis prev rec Miralax to her but she is not using any of this;  Rec to take Miralax daily & adjust amt to keep stools soft & easy to pass w/o straining;  Plus Bentyl 56m Qid as needed for abd discomfort & cramps...  ~  February 12, 2017:  325moOV & general medical follow up visit>  Cynthia Mathis returns feeling well, prev abdominal issues resolved & have not returned, she has stopped the gluten-free diet & back to eating what she wants;  She [facility]. We reviewed the following medical problems during today's office visit >>     Macular Degen> followed by Cynthia Mathis & Cynthia Mathis; she received shots for MD & had bilat cat surg & Glaucoma suspect; [facility]...    Hearing Loss> she saw Cynthia Mathis, ENT w/ audiology showing bilat SNHL, she has bilat hearing aides but notes that they are not right...    HBP> on ASA81, Valsartan160, Verap240, HCT25; BP= 140/80 by me today & she denies HA, CP, palpit, dizzy, SOB, edema, etc; she [facility] are all ~120...    Hx PAF on Coumadin followed in CC> ER 12/17 w/ palpit & PAF w/ RVR, converted spont to NSR 7placed on coumadin...    Hyperlipid> on Simva20; FLP 8/18 shows TChol 184, TG 157, HDL 52, LDL 100; continue same meds, stress compliance + better diet...    Hx Impaired FBS> Hx BS in the 130 range prev, but all readings ~100 x yrs now on diet alone;  Labs 8/18 showed BS=106, A1c=6.4    Hypothy> hx multinod thyroid x yrs; had thyroidectomy 1990 by Cynthia Mathis; she weaned off Thyroid hormone yrs ago & has remained clinically & biochem euthyroid; Labs 8/18 showed TSH= 0.65    GI- Divertics, Polyps> hx diverticulitis in 2008- followed by Cynthia Mathis; last CT Abd 09/2014 w/ severe diverticulosis & mild acute sigmoid diverticulitis- treated w/ Cipro/Flagyl & resolved; f/u colonoscopy 5/16 showed severe diverticulosis w/o inflamm or polyps seen, advised Miralax etc...    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 We checked her Tissue Transglutaminase Abs-IgG&IgA (both of which were NEG)...  Labs 2/17 showed Hg= 14.6 & prev LFTs are WNL.    GYN- DrRichardson    DJD, Osteoporosis> on Ca, MVI, Glucosamine; she uses OTC analgesic meds as needed; see prev BMDs below- Tscore -3.2 in Spine 2001 &  started on Fosamax w/ improved scores on Rx; on Bisphos drug holiday since 7/11 & f/u BMD 08/2013 showed normal Tscores- she remains on Ca, MVI, VitD supplements...    Memory Loss> eval by [facility]- Cynthia Mathis & neuro f/u periodically (MMSE=24/30 [phone] to have mild cognitive impairment, then w/ progressive impairment & they have her on Donepezil10 & Namenda10Bid.  EXAM shows Afeb, VSS, O2sat=96%;  HEENT- neg, mallampati1;  Chest- clear w/o w/r/r;  Heart- RR w/o m/r/g;  Abd- soft, nontender, neg;  Ext- +crepitus in right knee, w/o c/c/e;  Neuro- early dementia, no focal neuro changes...   LABS 02/13/17 shows>  FLP- ok but reminded to take med every day;  Chems- ok w/ BS=106, A1c=6.4;  CBC- wnl w/ Hg=14.4;  TSH=0.65 IMP/PLAN>>  Cynthia Mathis feels that she is doing well- no new complaints or concerns;  She requests refill Rxs for 90d supplies;  She will ret for FASTING blood work (labs ok) & continue her protime checks in the coumadin clinic... We plan rov in 430mo [place]ooner if needed for problems...   ~  August 15, 2017:  630moOV & Cynthia Mathis is holding up well & tells me that her husb recently had TAVR surg & although he's given up the pickle ball she wants me to know that6 she is still playing! She is doing satis- no new complaints or concerns, denies CP/ palpit/ SOB/ edema; notes min cough small amt clear phlegm, no f/c/s...     She saw Neuro-NMartin,NP 03/27/17>  Hx MCI w/ MMSE 24/30 (slow deterioration), on Aricept & Namenda- tol well; she feels stable, still drives and hasn't gotten lost...    She saw CARDS- DrNelson 07/23/17>  HBP, PAF & converted spont w/o known recurrence & no changes made...     BP controlled on Losartan100, CalanSR240, HCT25; BP= 130/68 and she denies HA, visual sx, CP, palpit, SOB, edema...    Hx transient PAF, holding NSR on coumadin follwed in CC...    Chol controlled on Atorva10,+ diet;  FLP 8/18 looks good, continue same...     Similarly BS's have remained  acceptable on diet alone... EXAM shows Afeb, VSS, O2sat=97%;  HEENT- neg, mallampati1;  Chest- clear w/o w/r/r;  Heart- RR w/o m/r/g;  Abd- soft, nontender, neg;  Ext- +crepitus in right knee, w/o c/c/e;  Neuro- early dementia, no focal neuro changes...  IMP/PLAN>>  Cynthia Mathis is stable on her current regimen & we reviewed her prob list & meds- all questions answered;  We also gave her the Prevnar-13 pneumonia shot today & this gets her up to date...     ~  Dec 05, 2017:  30m62moV & add-on appt requested by pt due to right lateral foot pain x 2wks; no known trauma, still playing pickle ball, just started hurting, hasn't tried any Tylenol or Ibuprofen, on Coumadin for hx PAF, no bruising etc... She is sl tender on lateral aspect of right foot but there are no lesions, color is wnl, pulses intact, etc... XRay today is unremarkable...  EXAM shows Afeb, VSS, O2sat=95%;  HEENT- neg, mallampati1;  Chest- clear w/o w/r/r;  Heart- RR w/o m/r/g;  Abd- soft, nontender, neg;  Ext- +crepitus in right knee, w/o c/c/e;  Rt foot w/ normal inspection, sl tender lateral aspect w/ o lesions noted;  Neuro- early dementia, no focal neuro changes...   XRay right foot 12/05/17>  No acute fracture subluxation, dislocation; small calcaneal spur noted; no abnormality seen over the area of tenderness at the 5th metatarsal bone...  IMP/PLAN>>  We discussed supportive rx w/ rest, ice, wrap/compression, Tylenol, and avoid trauma etc;  Ok to walk as needed since this does not produce pain;  Call if pain persists or gets worse for Ortho referral & consideration of MRI foot...           Problem List:  MACULAR DEGENERATION (ICD-362.50) - eval by Cynthia Mathis w/ foveal telangiectasia, macular drusen, macular degeneration, and cataracts- being followed regularly... vision is preserved so far... ~  11/11:  f/u Cynthia Mathis- stable age related mac degen, no retinopathy. ~  11/12:  f/u Cynthia Mathis- has advanced bilat AMD, sent to DrRCedar Creeke don't  have his notes... ~  She has been getting shots in the eyes for macular degen, we do not  have notes from [place]... ~  2/16: she tells me that she continues to receive the shots; she is also sched for bilat cat surg by Cynthia Mathis... ~  She continues to f/u w/ Cynthia Mathis- s/p bilat cats, glaucoma suspect, AMD; last note 12/17 reviewed;  She also sees Cynthia Mathis but we do not have notes from him...  HYPERTENSION (ICD-401.9) >>  ~  on VERAPAMIL 274m/d,  TOPROL-XL 271md,  HCTZ 2558m... Prev on Lisinopril but this was stopped due to throat clearing tic & mild cough, plus K=6.0 at the time (all improved off the ACE)... ~  8/10: borderline BP here and she will monitor at home carefully & call if not <150/90. ~  7/11: she reports that she incr the Lisinopril to 1&1/2 tabs due to BP~150+, now better... ~  7/12:  BP stable on Verap240 & Lisinopril 23m9m BP= 140/68 today> denies HA, fatigue, visual changes, CP, palipit, dizziness, syncope, dyspnea, edema, etc... ~  1/13:  BP= 160/90 (didn't take med today); states BP are ok at home; continue same meds + low sodium etc... ~  CXR 1/13 showed norm heart size, clear lungs, mild thor spondylosis, NAD... ~[name] 7/13:  BP= 152/82 & she notes some troublesome throat clearing & mild cough; we decided to STOP the ACE & switch to LOSARTAN 100mg23mmonitor her symptoms and her blood presure... ~  CXR 9/13 showed calcif Ao, clear lungs, suspect left thyroid enlargement, DJD in Tspine... ~  EKG 10/13 showed SBrady, rate58, wnl, NAD... ~  11/13: she had long convoluted hx of BP med changes- see above- now on Verap240, MetopER25, HCT25, & BP= 126/72; we discussed options but she is content to continue these same meds for now & watch BP... ~  3/14: on ASA81, Metop25, Verap240, HCT25; BP= 140/80 & she denies HA, CP, palpit, dizzy, SOB, edema, etc. ~  1/15: on ASA81, Metop25, Verap240, HCT25; BP= 140/82 & she remains essen asymptomatic; she is bradycardic & we decided to stop  ToprolXL & replace it w/ [id number] 1/15 showed heart at upper lim of norm, mild dev of trach to right, clear lungs, NAD...  ~  EKG 1/15 showed SBrady, rate44, NSSTTWA, NAD... ~ [name]7/15: on ASA81, Diovan160, Verap240, HCT25; BP= 120/60 & she is essentially asymptomatic, pulse=60, continue same meds... ~  2/16: on same meds & BP= 128/76, she remains asymptomatic... ~  BP controlled on Diovan160, Verapamil240, HCT25; they monitor BP at home & at the Y...  PAF >> she had bout of palpit & went to the ER 06/2016 found to be in AFib w/ rvr => spont converted to NSR w/o known recurrence; started on Eliquis2.5Bid & set up to see CARDS. ~  08/2016> she was eval by DrNelson-- changed to Coumadin due to $$ issues and 2DEcho showed mod focal basilar hypertrophy, norm sys function w/ EF=60-65%, no regional wall motion abn, Gr1DD, AoV mildly thickened calcif leaflets- no AS, mild AI; norm MV, normal LA at 33mm,21mm RV, PAsys=33...  DYSLIPIDEMIA (ICD-272.4) - on ZOCOR 20mg/d8mplus doing well on diet... ~  FLP 6/0Evarohowed TChol 144, TG 140, HDL 39, LDL 77... continue same. ~  FLP 2/10 showed TChol 164, TG 77, HDL 54, LDL 95 ~  FLP 8/10 showed TChol 138, TG 51, HDL 48, LDL 80 ~  FLP 1/11 showed TChol 155, TG 100, HDL 48, LDL 87 ~  FLP 7/11 showed TChol 158, TG 74, HDL 55, LDL 89 ~  FLP 1/12 showed TChol 155, TG  101, HDL 50, LDL 85 ~  FLP 1/13 on Simva20 showed TChol 169, TG 118, HDL 56, LDL 89  ~  FLP 1/15 on Simva20 showed TChol 155, TG 122, HDL 50, LDL 81 ~  FLP 2/16 on Simva20 showed TChol 161, TG 107, HDL 61, LDL 79; continue same meds. ~  She wanted off Simvastatin & was switched to Atorva10 ~  [place] 8/18 on Atorva10 showed   Hx Impaired Fasting Glucose >> on diet alone... FBS @ home betw 120-130 recently... she is on a good diet and weight is down at 139# today (63"  BMI= 24)... ~  labs 3/09 showed FBS= 119... subseq HgA1c 5/09 was 6.4... diet Rx. ~  labs 8/09 showed BS= 106, HgA1c= 5.9.Marland KitchenMarland Kitchen rec-  same. ~  labs 2/10 showed BS= 102, A1c= 5.9 ~  labs 1/11 showed BS= 98 ~  labs 1/12 showed BS= 95 ~  Labs 1/13 showed BS= 101 ~  Labs 3/14 showed BS=106, A1c=6.2 ~  Labs 1/15 showed BS= 103 ~  Labs 2/16 showed FBS= 99  HYPOTHYROIDISM (ICD-244.9) - off Synthroid now...  clinically & biochemically euthyroid... remote hx of overactive thyroid many yrs ago in [place] (we don't have records)... she had an eval by DrKohut in 1987 w/ familial multinodular goiter w/ neg FNA... started on Rx but goiter enlarged- had thyroidectomy 1990 by Cynthia Mathis... ~  labs 3/09 showed TSH = 0.35 (0.35-5.5)... we have slowly decr her dose to 37mg/d... ~  labs 8/09 showed TSH= 0.33... rec- decr the Synth50 to 1/2 tab daily. ~  labs 2/10 showed =TSH= 0.54 and Synthroid stopped... ~  labs 4/10 off med showed TSH= 0.59 ~  labs 8/10 showed TSH= 0.60 ~  labs 1/11 showed TSH= 0.60 ~  labs 7/11 showed TSH= 0.56 ~  labs 1/12 showed TSH= 0.60 ~  Labs 1/13 showed TSH= 0.58 ~  Labs 3/14 showed TSH= 0.37 ~  Labs 1/15 showed TSH= 0.59 ~  Labs 2/16 showed TSH= 0.76  DIVERTICULOSIS OF COLON (ICD-562.10) COLONIC POLYPS (ICD-211.3) - followed by Cynthia Mathis... bout of diverticulitis in Mar08... ~  last colonoscopy 3/08 showed divertics, 238mpolyp, hems... path=hyperplastic, f/u planned 5y1yr~  F/u colonoscopy 4/13 by Cynthia Mathis showed severe diverticulosis w/o inflamm and w/o polyps etc... ~  2016:  CT Abd 09/2014 w/ severe diverticulosis & mild acute sigmoid diverticulitis- treated w/ Cipro/Flagyl & resolved; f/u colonoscopy 5/16 showed severe diverticulosis w/o inflamm or polyps seen, advised Miralax etc...  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  HEMATURIA, HX OF (ICD-V13.09) - eval 4/08 by DrGrapey w/ neg Sonar, Cysto,  cytology...  GY[facility] >> [facility]Richardson[facility])...  DEGENERATIVE JOINT DISEASE (ICD-715.90) - c/o discomfort in her shoulders and knees... uses OTC meds as needed. ~  04/2015:  She presented w/ 1wk hx right knee & LBP; exam showed crepitation in right knee but good ROM & SLR was neg; Rec to use Tramadol+Tylenol, neoprene support sleeve for knee & rest/heat/ exercises for back; we will refer to Ortho for shots if not responding...   Hx of OSTEOPOROSIS (ICD-733.00) - on Fosamax 70/wk thru 7/11, Caltrate 1/d, MVI, Vit D 1000/d... ~  initial BMD 10/01 showed TScores -0.2  to -3.2 in the spine...  ~  f/u studies w/ steady improvement on Fosamax therapy... ~  BMD 3/09 w/ TScores -0.1 to -1.7 in the spine... continue Rx. ~  BMD 7/11 showed TScores -2.0 Spine, and -0.3 in right FemNeck... we opted for Bisphos drug holiday to start now. ~  Labs 1/13 showed Vit D level = 48, continue same... ~  BMD 2/15 showed WNL Tscores; rec to continue calcium, MVI, VitD, wt bearing exercise...  PROGRESSIVE MEMORY LOSS >>  ~  MMSE 5/09 by Doran Heater, NP was 29/30... Reassured. ~  3/14: she presented w/ c/o worsening memory & MMSE has diminished- refer to [place] for eval & rx... ~  Neuro eval 4/14 by Cynthia Mathis> mild cognitive impairment vs norm aging, he did not rec starting meds... ~  CT Brain 4/14 showed mild atrophy & sm vessel dis ~  She continues to f/u w/ Neurology periodically... ~  1/15: ROV here & husb more concerned re her memory, recall was 0/3 after distraction; husb has apparently done well on Aricept & she wants trial, start ARICEPT5=>10 daily... ~  She has been followed by Cynthia Mathis & they added Namenda 10Bid to her Aricept10...  ANXIETY (ICD-300.00) - prev on Alpraz Prn but hasn't used in yrs...  DERMATOLOGY >> LIPOMA (ICD-214.9) - notes large lipoma in RLQ/ rt flank area... some discomfort noted... seen by DrLeone in the past w/o surg... she saw DrCornett, CCS,  1/11 for second opinion & she will decide re: excision... ~  5/13:  She had Basal cell ca removed from the left side of her nose, w/ subseq deeper margins & reports everything is OK... ~  7/13:  She is concerned that the lipoma may be growing & she requests f/u DrCornett for excision... ~  10/13:  Lipoma removed by DrCornett in outpt surg 04/17/12...  SHINGLES - remote hx of right T8-9 shingles in 1986...   Past Surgical History:  Procedure Laterality Date  . APPENDECTOMY  1947  . COLONOSCOPY    . MASS EXCISION  04/17/2012   Procedure: EXCISION MASS;  Surgeon: [name], MD;  Location: [facility];  Service: General;  Laterality: N/A;  . THYROIDECTOMY  1990   Cynthia. [name]    Outpatient Encounter Medications as of 12/05/2017  Medication Sig  . atorvastatin (LIPITOR) 10 MG tablet Take 1 tablet (10 mg total) by mouth daily.  . Calcium Carbonate-Vitamin D (CALCIUM 600+D) 600-400 MG-UNIT per tablet Take 1 tablet by mouth daily.  . Cyanocobalamin (VITAMIN B 12 PO) Take 1 tablet by mouth daily. Reported on 12/27/2015  . dicyclomine (BENTYL) 10 MG capsule Take 1 capsule (10 mg total) by mouth 4 (four) times daily -  before meals and at bedtime.  . donepezil (ARICEPT) 10 MG tablet TAKE ONE TABLET BY MOUTH EVERY NIGHT AT BEDTIME  . fish oil-omega-3 fatty acids 1000 MG capsule Take 1 g by mouth daily.  [name] glucosamine-chondroitin 500-400 MG tablet Take 1 tablet by mouth 3 (three) times daily.  . hydrochlorothiazide (HYDRODIURIL) 25 MG tablet Take 1 tablet (25 mg total) by mouth daily.  [name] losartan (COZAAR) 100 MG tablet TAKE ONE TABLET BY MOUTH DAILY  . memantine (NAMENDA) 10 MG tablet TAKE ONE TABLET BY MOUTH TWICE A DAY  . Multiple Vitamins-Minerals (MULTIVITAMIN & MINERAL PO) Take 1 tablet by mouth daily.  . verapamil (CALAN-SR) 240 MG CR tablet Take 1 tablet (240 mg total) by mouth at bedtime.  [name] warfarin (COUMADIN) 5 MG tablet TAKE AS DIRECTED BY COUMADIN CLINIC  .  [DISCONTINUED] warfarin (COUMADIN) 5 MG tablet TAKE AS DIRECTED BY  COUMADIN CLINIC   No facility-administered encounter medications on file as of 12/05/2017.     Allergies  Allergen Reactions  . Epinephrine Palpitations    REACTION: heart racing    Immunization History  Administered Date(s) Administered  . DTP 04/17/2000  . Influenza Split 05/20/2011, 05/15/2012  . Influenza Whole 05/02/2010  . Influenza, High Dose Seasonal PF 05/08/2013, 05/25/2017  . Influenza,inj,Quad PF,6+ Mos 05/25/2014, 06/29/2015, 06/13/2016  . Pneumococcal Conjugate-13 08/15/2017  . Pneumococcal Polysaccharide-23 04/17/2000  . Tdap 08/04/2013    Current Medications, Allergies, Past Medical History, Past Surgical History, Family History, and Social History were reviewed in [employer] record.    Review of Systems        See HPI - all other systems neg except as noted...  The patient complains of dyspnea on exertion.  The patient denies anorexia, fever, weight loss, weight gain, vision loss, decreased hearing, hoarseness, chest pain, syncope, peripheral edema, prolonged cough, headaches, hemoptysis, abdominal pain, melena, hematochezia, severe indigestion/heartburn, hematuria, incontinence, muscle weakness, suspicious skin lesions, transient blindness, difficulty walking, depression, unusual weight change, abnormal bleeding, enlarged lymph nodes, and angioedema.   Objective:   Physical Exam    WD, WN, 84 y/o WF in NAD... VITAL SIGNS:  Reviewed... GENERAL:  Alert & oriented; pleasant & cooperative... HEENT:  [place]/AT, EOM-wnl, PERRLA, EACs-clear, TMs-wnl, NOSE-clear, THROAT-clear & wnl. NECK:  Supple w/ fair ROM; no JVD; normal carotid impulses w/o bruits; scar of prev thyroid surg- no nodules or goiter; no lymphadenopathy. CHEST:  Clear to P & A; without wheezes/ rales/ or rhonchi. HEART:  Regular Rhythm; without murmurs/ rubs/ or gallops. ABDOMEN:  Soft & nontender; normal bowel  sounds; no organomegaly or masses detected. EXT: without deformities, mild arthritic changes; no varicose veins/ venous insuffic/ or edema. prob heel spurs w/ some plantar fascia pain> advised soaks & stretching... NEURO:  CN's intact;  no focal neuro deficits... DERM:  Scar in right flank after lipoma excision... no other lesions noted...  MMSE 09/17/12>  She did not know the Pres ("I can see him, a black guy"), VP, or Gov;  Didn't know prev pres etc;  Recall 3/3 immed, but only 1/3 after distraction;  Serial 7s & WORLD backwards were fluent; proverbs were abstracted normally...  RADIOLOGY DATA:  Reviewed in the EPIC EMR & discussed w/ the patient...  LABORATORY DATA:  Reviewed in the EPIC EMR & discussed w/ the patient...   Assessment & Plan:    02/12/17>   Cynthia Mathis feels that she is doing well- no new complaints or concerns;  She requests refill Rxs for 90d supplies;  She will ret for FASTING blood work & continue her protime checks in the coumadin clinic... We plan rov in 66mo [place] if needed for problems. 08/15/17>   Cynthia Mathis is stable on her current regimen & we reviewed her prob list & meds- all questions answered;  We also gave her the Prevnar-13 pneumonia shot today & this gets her up to date...  12/05/17>   We discussed supportive rx w/ rest, ice, wrap/compression, Tylenol, and avoid trauma etc;  Ok to walk as needed since this does not produce pain;  Call if pain persists or gets worse for Ortho referral & consideration of MRI foot   Macular Degen>  Followed by Cynthia Mathis/ [facility] & getting MD shots...  HBP>  on Verap, Diovan, HCT- along w/ no salt; & BP appears to be well controlled & pulse improved (60/min)  ATRIAL FLUTTER EPISODE 06/2016 => spont converted, started  on Eliquis2.5Bid & set up to see CARDS...  DYSLIPIDEMIA>  Stable on diet & Simva20...  Borderline DM>  Hx BS in the 120-130 range yrs ago but stable ~100 for the last 3+yrs on diet alone...  Hx Hypothyroidism>   She weaned off Synthroid several yrs ago & TSH has remained normal, clinically euthyroid as well...  GI>  Divertics, Colon Polyps>  Stable & up to date w/ f/u colon 2016 from Cynthia Mathis==> divertics, no polyps...  LIPOMA>  Removed 10/13 from RLQ area by DrCornett...  DJD>  She notes some right knee discomfort playing "pickle ball" & is advised to wear knee brace, take Aleve prn (she declines further eval or prescription meds)... 10/16>  1 wk hx right knee & LBP; exam shows good ROM right knee w/ crepitation; XRays indicate degen arthritis; we discussed Rx w/ Tramadol+Tylenol, neoprene sleeve for knee and rest/ heat for back; I indicated next step is to refer to Ortho for shot if symptoms don't respond to Rx.  Osteoporosis>  Hx BMD in 2001 w/ TScore =3.2 in Spine; Rx'd Fosamax for 88yr w/ steady improvement & on Bisphos drug holiday since 7/11 & BMD is 2015 was WNL- continue supplements w/ Calcium, MVI, Vit D...  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  MEMORY LOSS>  Followed by for mild dementia w/ MMSE 26/30- Cynthia Mathis on Aricept & Namenda...  Other medical problems as listed...   Patient's Medications  New Prescriptions   No medications on file  Previous Medications   ATORVASTATIN (LIPITOR) 10 MG TABLET    Take 1 tablet (10 mg total) by mouth daily.   CALCIUM CARBONATE-VITAMIN D (CALCIUM 600+D) 600-400 MG-UNIT PER TABLET    Take 1 tablet by mouth daily.   CYANOCOBALAMIN (VITAMIN B 12 PO)    Take 1 tablet by mouth daily. Reported on 12/27/2015   DICYCLOMINE (BENTYL) 10 MG CAPSULE    Take 1 capsule (10 mg total) by mouth 4 (four) times daily -  before meals and at bedtime.   DONEPEZIL (ARICEPT) 10 MG TABLET    TAKE ONE TABLET BY MOUTH EVERY NIGHT AT BEDTIME   FISH OIL-OMEGA-3 FATTY ACIDS 1000 MG CAPSULE    Take 1 g by mouth daily.   GLUCOSAMINE-CHONDROITIN 500-400 MG TABLET    Take 1 tablet by  mouth 3 (three) times daily.   HYDROCHLOROTHIAZIDE (HYDRODIURIL) 25 MG TABLET    Take 1 tablet (25 mg total) by mouth daily.   LOSARTAN (COZAAR) 100 MG TABLET    TAKE ONE TABLET BY MOUTH DAILY   MEMANTINE (NAMENDA) 10 MG TABLET    TAKE ONE TABLET BY MOUTH TWICE A DAY   MULTIPLE VITAMINS-MINERALS (MULTIVITAMIN & MINERAL PO)    Take 1 tablet by mouth daily.   VERAPAMIL (CALAN-SR) 240 MG CR TABLET    Take 1 tablet (240 mg total) by mouth at bedtime.   WARFARIN (COUMADIN) 5 MG TABLET    TAKE AS DIRECTED BY COUMADIN CLINIC  Modified Medications   No medications on file  Discontinued Medications   WARFARIN (COUMADIN) 5 MG TABLET    TAKE AS DIRECTED BY COUMADIN CLINIC

## 2017-12-05 NOTE — Patient Instructions (Signed)
Today we updated your med list in our EPIC system...    Continue your current medications the same...  We need to see an XRay of your right foot>    I will call you with this report later today so we can outline a treatment plan/ course of action...  Let me know if you have any questions.Marland KitchenMarland Kitchen

## 2017-12-06 ENCOUNTER — Encounter: Payer: Self-pay | Admitting: Pulmonary Disease

## 2017-12-10 ENCOUNTER — Ambulatory Visit: Payer: Medicare Other | Admitting: *Deleted

## 2017-12-10 DIAGNOSIS — Z5181 Encounter for therapeutic drug level monitoring: Secondary | ICD-10-CM

## 2017-12-10 DIAGNOSIS — I480 Paroxysmal atrial fibrillation: Secondary | ICD-10-CM

## 2017-12-10 LAB — POCT INR: INR: 1.6 — AB (ref 2.0–3.0)

## 2017-12-10 NOTE — Patient Instructions (Signed)
Description   Today take 1 tablet, then start taking Coumadin 1/2 tablet everyday except 1 tablet on Sundays, Wednesdays, and Fridays.  Recheck INR in 2 weeks. Call with any medication changes or if scheduled for any procedures [phone].

## 2017-12-24 ENCOUNTER — Ambulatory Visit: Payer: Medicare Other | Admitting: *Deleted

## 2017-12-24 DIAGNOSIS — Z5181 Encounter for therapeutic drug level monitoring: Secondary | ICD-10-CM

## 2017-12-24 DIAGNOSIS — I480 Paroxysmal atrial fibrillation: Secondary | ICD-10-CM

## 2017-12-24 LAB — POCT INR: INR: 2.9 (ref 2.0–3.0)

## 2017-12-24 NOTE — Patient Instructions (Signed)
Description   Continue taking  1/2 tablet everyday except 1 tablet on Sundays, Wednesdays, and Fridays.  Recheck INR in 2 weeks. Call with any medication changes or if scheduled for any procedures [phone].

## 2018-01-02 DIAGNOSIS — H3562 Retinal hemorrhage, left eye: Secondary | ICD-10-CM | POA: Diagnosis not present

## 2018-01-02 DIAGNOSIS — H35351 Cystoid macular degeneration, right eye: Secondary | ICD-10-CM | POA: Diagnosis not present

## 2018-01-02 DIAGNOSIS — H353212 Exudative age-related macular degeneration, right eye, with inactive choroidal neovascularization: Secondary | ICD-10-CM | POA: Diagnosis not present

## 2018-01-02 DIAGNOSIS — H353211 Exudative age-related macular degeneration, right eye, with active choroidal neovascularization: Secondary | ICD-10-CM | POA: Diagnosis not present

## 2018-01-02 DIAGNOSIS — H353221 Exudative age-related macular degeneration, left eye, with active choroidal neovascularization: Secondary | ICD-10-CM | POA: Diagnosis not present

## 2018-01-07 ENCOUNTER — Other Ambulatory Visit: Payer: Medicare Other | Admitting: *Deleted

## 2018-01-07 ENCOUNTER — Ambulatory Visit: Payer: Medicare Other | Admitting: *Deleted

## 2018-01-07 DIAGNOSIS — E785 Hyperlipidemia, unspecified: Secondary | ICD-10-CM

## 2018-01-07 DIAGNOSIS — I480 Paroxysmal atrial fibrillation: Secondary | ICD-10-CM

## 2018-01-07 DIAGNOSIS — Z7901 Long term (current) use of anticoagulants: Secondary | ICD-10-CM | POA: Diagnosis not present

## 2018-01-07 DIAGNOSIS — Z5181 Encounter for therapeutic drug level monitoring: Secondary | ICD-10-CM | POA: Diagnosis not present

## 2018-01-07 LAB — CBC WITH DIFFERENTIAL/PLATELET
Basophils Absolute: 0.1 10*3/uL (ref 0.0–0.2)
Basos: 1 %
EOS (ABSOLUTE): 0.3 10*3/uL (ref 0.0–0.4)
Eos: 3 %
Hematocrit: 42.7 % (ref 34.0–46.6)
Hemoglobin: 15.1 g/dL (ref 11.1–15.9)
Immature Grans (Abs): 0 10*3/uL (ref 0.0–0.1)
Immature Granulocytes: 0 %
Lymphocytes Absolute: 3.3 10*3/uL — ABNORMAL HIGH (ref 0.7–3.1)
Lymphs: 33 %
MCH: 31.7 pg (ref 26.6–33.0)
MCHC: 35.4 g/dL (ref 31.5–35.7)
MCV: 90 fL (ref 79–97)
Monocytes Absolute: 0.8 10*3/uL (ref 0.1–0.9)
Monocytes: 8 %
Neutrophils Absolute: 5.5 10*3/uL (ref 1.4–7.0)
Neutrophils: 55 %
Platelets: 258 10*3/uL (ref 150–450)
RBC: 4.77 x10E6/uL (ref 3.77–5.28)
RDW: 13.5 % (ref 12.3–15.4)
WBC: 10 10*3/uL (ref 3.4–10.8)

## 2018-01-07 LAB — COMPREHENSIVE METABOLIC PANEL
ALT: 21 IU/L (ref 0–32)
AST: 21 IU/L (ref 0–40)
Albumin/Globulin Ratio: 1.5 (ref 1.2–2.2)
Albumin: 4.1 g/dL (ref 3.5–4.7)
Alkaline Phosphatase: 73 IU/L (ref 39–117)
BUN/Creatinine Ratio: 22 (ref 12–28)
BUN: 19 mg/dL (ref 8–27)
Bilirubin Total: 0.5 mg/dL (ref 0.0–1.2)
CO2: 26 mmol/L (ref 20–29)
Calcium: 9.8 mg/dL (ref 8.7–10.3)
Chloride: 100 mmol/L (ref 96–106)
Creatinine, Ser: 0.88 mg/dL (ref 0.57–1.00)
GFR calc Af Amer: 70 mL/min/{1.73_m2} (ref >59)
GFR calc non Af Amer: 61 mL/min/{1.73_m2} (ref >59)
Globulin, Total: 2.8 g/dL (ref 1.5–4.5)
Glucose: 161 mg/dL — ABNORMAL HIGH (ref 65–99)
Potassium: 3.8 mmol/L (ref 3.5–5.2)
Sodium: 140 mmol/L (ref 134–144)
Total Protein: 6.9 g/dL (ref 6.0–8.5)

## 2018-01-07 LAB — LIPID PANEL
Chol/HDL Ratio: 4.3 ratio (ref 0.0–4.4)
Cholesterol, Total: 187 mg/dL (ref 100–199)
HDL: 44 mg/dL (ref >39)
LDL Calculated: 92 mg/dL (ref 0–99)
Triglycerides: 255 mg/dL — ABNORMAL HIGH (ref 0–149)
VLDL Cholesterol Cal: 51 mg/dL — ABNORMAL HIGH (ref 5–40)

## 2018-01-07 LAB — POCT INR: INR: 2.8 (ref 2.0–3.0)

## 2018-01-07 NOTE — Patient Instructions (Signed)
Description   Continue taking  1/2 tablet everyday except 1 tablet on Sundays, Wednesdays, and Fridays.  Recheck INR in 3 weeks. Call with any medication changes or if scheduled for any procedures [phone].

## 2018-01-08 ENCOUNTER — Telehealth: Payer: Self-pay | Admitting: *Deleted

## 2018-01-08 MED ORDER — ATORVASTATIN CALCIUM 20 MG PO TABS: 20.0000 mg | ORAL_TABLET | Freq: Every day | ORAL | 2 refills | Status: DC

## 2018-01-08 NOTE — Telephone Encounter (Signed)
Notified the pt that per [name], her labs showed that she had normal CBC, normal CMET, normal LDL, but elevated triglycerides.  Informed the pt that [name] recommends that we increase her atorvastatin to 20 mg po daily.  Confirmed the pharmacy of choice with the pt.  Informed the pt that we will see her as scheduled for 01/21/18 at 3 pm.  Pt verbalized understanding and agrees with this plan.

## 2018-01-08 NOTE — Telephone Encounter (Signed)
-----   Message from [name], MD sent at 01/08/2018 11:37 AM EDT ----- Normal CBC, CMP, LDL, elevated TG, I would increase atorvastatin to 20 mg po daily.

## 2018-01-17 DIAGNOSIS — H1851 Endothelial corneal dystrophy: Secondary | ICD-10-CM | POA: Diagnosis not present

## 2018-01-17 DIAGNOSIS — H02833 Dermatochalasis of right eye, unspecified eyelid: Secondary | ICD-10-CM | POA: Diagnosis not present

## 2018-01-17 DIAGNOSIS — H40013 Open angle with borderline findings, low risk, bilateral: Secondary | ICD-10-CM | POA: Diagnosis not present

## 2018-01-17 DIAGNOSIS — H04123 Dry eye syndrome of bilateral lacrimal glands: Secondary | ICD-10-CM | POA: Diagnosis not present

## 2018-01-21 ENCOUNTER — Encounter: Payer: Self-pay | Admitting: Cardiology

## 2018-01-21 ENCOUNTER — Ambulatory Visit (INDEPENDENT_AMBULATORY_CARE_PROVIDER_SITE_OTHER): Payer: Medicare Other | Admitting: Cardiology

## 2018-01-21 VITALS — BP 140/76 | HR 61 | Ht 63.0 in | Wt 146.2 lb

## 2018-01-21 DIAGNOSIS — Z7901 Long term (current) use of anticoagulants: Secondary | ICD-10-CM

## 2018-01-21 DIAGNOSIS — I480 Paroxysmal atrial fibrillation: Secondary | ICD-10-CM | POA: Diagnosis not present

## 2018-01-21 DIAGNOSIS — E782 Mixed hyperlipidemia: Secondary | ICD-10-CM

## 2018-01-21 DIAGNOSIS — E785 Hyperlipidemia, unspecified: Secondary | ICD-10-CM | POA: Diagnosis not present

## 2018-01-21 DIAGNOSIS — Z5181 Encounter for therapeutic drug level monitoring: Secondary | ICD-10-CM | POA: Diagnosis not present

## 2018-01-21 MED ORDER — HYDROCHLOROTHIAZIDE 25 MG PO TABS: 25.0000 mg | ORAL_TABLET | Freq: Every day | ORAL | 3 refills | Status: DC

## 2018-01-21 MED ORDER — ATORVASTATIN CALCIUM 20 MG PO TABS: 20.0000 mg | ORAL_TABLET | Freq: Every day | ORAL | 2 refills | Status: DC

## 2018-01-21 MED ORDER — VERAPAMIL HCL ER 240 MG PO TBCR: 240.0000 mg | EXTENDED_RELEASE_TABLET | Freq: Every day | ORAL | 3 refills | Status: DC

## 2018-01-21 MED ORDER — LOSARTAN POTASSIUM 100 MG PO TABS: 100.0000 mg | ORAL_TABLET | Freq: Every day | ORAL | 3 refills | Status: DC

## 2018-01-21 NOTE — Progress Notes (Signed)
Cardiology Office Note    Date:  01/21/2018   ID:  Cynthia Mathis, DOB February 10, 1933, MRN [id number]  PCP:  Cynthia Space, MD  Cardiologist:  Cynthia Dawley, MD  Referring physician: Noralee Space, MD Chief complain: Follow up for new dg of PAF  History of Present Illness:  Cynthia Mathis is a 84 y.o. female with h/o DM, hyperlipidemia, hypertension who presented to the ER on 06/29/2016 with palpitations and was diagnosed with new onset atrial fibrillation with RVR - ventricular rate 140 BPM. The patient was symptomatic with dizziness and felt the onset of atrial fibrillation. While in the ER she spontaneously cardioverted into the SR and was discharged home. CHADVASC score: 5. She was started on Eliquis and reports no bleeding. No palpitations since then.   11/24/2016 - the patient is coming after 3 months, since the last visit she hasn't experienced any chest pain shortness of breath no dizziness no palpitation presyncope or syncope. She denies any bleeding with warfarin and no muscle pain with Lipitor. She has been experiencing abdominal pains and diarrhea as and might possibly need a colonoscopy.  01/21/2018 -the patient is coming after 6 months, she feels well, denies any palpitation dizziness or syncope.  She continues to play pickle ball 1-2 hours a day and denies any chest pain or shortness of breath.  No lower extremity edema no claudications.  She is compliant with her medication and has no side effects with atorvastatin and no bleeding with warfarin.  Past Medical History:  Diagnosis Date  . Anxiety   . Borderline diabetes mellitus   . Diverticulosis of colon   . DJD (degenerative joint disease)   . Dyslipidemia   . History of hematuria   . History of osteoporosis   . Hx of colonic polyps   . Hyperlipidemia   . Hypertension   . Hypothyroid   . Lipoma   . Macular degeneration   . Memory loss     Past Surgical History:  Procedure Laterality Date  . APPENDECTOMY   1947  . COLONOSCOPY    . MASS EXCISION  04/17/2012   Procedure: EXCISION MASS;  Surgeon: [name], MD;  Location: [facility];  Service: General;  Laterality: N/A;  . THYROIDECTOMY  1990   [name]   Current Medications: Outpatient Medications Prior to Visit  Medication Sig Dispense Refill  . Calcium Carbonate-Vitamin D (CALCIUM 600+D) 600-400 MG-UNIT per tablet Take 1 tablet by mouth daily.    . Cyanocobalamin (VITAMIN B 12 PO) Take 1 tablet by mouth daily. Reported on 12/27/2015    . dicyclomine (BENTYL) 10 MG capsule Take 1 capsule (10 mg total) by mouth 4 (four) times daily -  before meals and at bedtime. 90 capsule 5  . donepezil (ARICEPT) 10 MG tablet TAKE ONE TABLET BY MOUTH EVERY NIGHT AT BEDTIME 90 tablet 3  . fish oil-omega-3 fatty acids 1000 MG capsule Take 1 g by mouth daily.    [name] glucosamine-chondroitin 500-400 MG tablet Take 1 tablet by mouth 3 (three) times daily.    . memantine (NAMENDA) 10 MG tablet TAKE ONE TABLET BY MOUTH TWICE A DAY 180 tablet 3  . Multiple Vitamins-Minerals (MULTIVITAMIN & MINERAL PO) Take 1 tablet by mouth daily.    [name] warfarin (COUMADIN) 5 MG tablet TAKE AS DIRECTED BY COUMADIN CLINIC 30 tablet 3  . atorvastatin (LIPITOR) 20 MG tablet Take 1 tablet (20 mg total) by mouth daily. 90 tablet 2  . hydrochlorothiazide (HYDRODIURIL)  25 MG tablet Take 1 tablet (25 mg total) by mouth daily. 90 tablet 3  . losartan (COZAAR) 100 MG tablet TAKE ONE TABLET BY MOUTH DAILY 90 tablet 3  . verapamil (CALAN-SR) 240 MG CR tablet Take 1 tablet (240 mg total) by mouth at bedtime. 90 tablet 3   No facility-administered medications prior to visit.      Allergies:   Epinephrine   Social History   Socioeconomic History  . Marital status: Married    Spouse name: Cynthia Mathis  . Number of children: 3  . Years of education: [employer]  . Highest education level: Not on file  Occupational History  . Occupation: Education officer, environmental:  RETIRED  Social Needs  . Financial resource strain: Not on file  . Food insecurity:    Worry: Not on file    Inability: Not on file  . Transportation needs:    Medical: Not on file    Non-medical: Not on file  Tobacco Use  . Smoking status: Former Smoker    Last attempt to quit: 07/10/1965    Years since quitting: 52.5  . Smokeless tobacco: Never Used  . Tobacco comment: 3 cigs per week x 3 years (started in 1964), 12/27/15 does not smoke  Substance and Sexual Activity  . Alcohol use: Yes    Alcohol/week: 0.0 oz    Comment: occasionally  . Drug use: No  . Sexual activity: Not on file  Lifestyle  . Physical activity:    Days per week: Not on file    Minutes per session: Not on file  . Stress: Not on file  Relationships  . Social connections:    Talks on phone: Not on file    Gets together: Not on file    Attends religious service: Not on file    Active member of club or organization: Not on file    Attends meetings of clubs or organizations: Not on file    Relationship status: Not on file  Other Topics Concern  . Not on file  Social History Narrative   Pt lives at home with her spouse Cynthia Mathis, who has been dx'd with mild cognitive impairment vs. mild dementia, former pt of [name]). She does not use caffeine, if so, rarely.     Family History:  The patient's family history includes Stroke in her mother.   ROS:   Please see the history of present illness.    ROS All other systems reviewed and are negative.   PHYSICAL EXAM:   VS:  BP 140/76   Pulse 61   Ht 5\' 3"  (1.6 m)   Wt 146 lb 3.2 oz (66.3 kg)   SpO2 98%   BMI 25.90 kg/m    GEN: Well nourished, well developed, in no acute distress  HEENT: normal  Neck: no JVD, carotid bruits, or masses Cardiac: RRR; no murmurs, rubs, or gallops,no edema, poor peripheral pulses Respiratory:  clear to auscultation bilaterally, normal work of breathing GI: soft, nontender, nondistended, + BS MS: no deformity or atrophy   Skin: warm and dry, no rash Neuro:  Alert and Oriented x 3, Strength and sensation are intact Psych: euthymic mood, full affect  Wt Readings from Last 3 Encounters:  01/21/18 146 lb 3.2 oz (66.3 kg)  12/05/17 144 lb (65.3 kg)  08/15/17 143 lb 12.8 oz (65.2 kg)    Studies/Labs Reviewed:   EKG: Is not ordered today  Recent Labs: 02/13/2017: TSH 0.65 01/07/2018: ALT  21; BUN 19; Creatinine, Ser 0.88; Hemoglobin 15.1; Platelets 258; Potassium 3.8; Sodium 140   Lipid Panel    Component Value Date/Time   CHOL 187 01/07/2018 0841   TRIG 255 (H) 01/07/2018 0841   HDL 44 01/07/2018 0841   CHOLHDL 4.3 01/07/2018 0841   CHOLHDL 4 02/13/2017 0905   VLDL 31.4 02/13/2017 0905   LDLCALC 92 01/07/2018 0841   Additional studies/ records that were reviewed today include:   TTE: 09/2016 - Left ventricle: The cavity size was normal. There was moderate   focal basal hypertrophy of the septum with mild posterior wall   hypertrophy. Systolic function was normal. The estimated ejection   fraction was in the range of 60% to 65%. Wall motion was normal;   there were no regional wall motion abnormalities. Doppler   parameters are consistent with abnormal left ventricular   relaxation (grade 1 diastolic dysfunction). Doppler parameters   are consistent with indeterminate ventricular filling pressure. - Aortic valve: Transvalvular velocity was within the normal range.   There was no stenosis. There was mild regurgitation. - Right ventricle: The cavity size was normal. Wall thickness was   normal. Systolic function was normal. - Atrial septum: No defect or patent foramen ovale was identified   by color flow Doppler. - Tricuspid valve: There was mild regurgitation. - Pulmonary arteries: Systolic pressure was within the normal   range. PA peak pressure: 33 mm Hg (S).    ASSESSMENT:    1. Encounter for therapeutic drug monitoring   2. Hyperlipidemia, unspecified hyperlipidemia type   3. PAF  (paroxysmal atrial fibrillation) ([place])   4. Chronic anticoagulation   5. Mixed hyperlipidemia    PLAN:  In order of problems listed above:  1. Proximal atrial fibrillation  -no recent episode, no palpitations, no bleeding on Coumadin, hemoglobin 5.1 on January 07, 2018.   2. Hypertension  -at home the 120 range, will continue the same regimen.   3. Hyperlipidemia  -LDL at goal however elevated triglycerides, she is advised on limiting intake of sweets that she like.   4. Follow-up in 1 year.   Medication Adjustments/Labs and Tests Ordered: Current medicines are reviewed at length with the patient today.  Concerns regarding medicines are outlined above.  Medication changes, Labs and Tests ordered today are listed in the Patient Instructions below. Patient Instructions  Medication Instructions:   Your physician recommends that you continue on your current medications as directed. Please refer to the Current Medication list given to you today.    Labwork:  PRIOR TO YOUR ONE YEAR FOLLOW-UP APPOINTMENT WITH DR Maylea Soria TO CHECK--CMET, CBC W DIFF, TSH, AND LIPIDS--PLEASE COME FASTING TO THIS LAB APPOINTMENT     Follow-Up:  Your physician wants you to follow-up in: [place] will receive a reminder letter in the mail two months in advance. If you don't receive a letter, please call our office to schedule the follow-up appointment.  PLEASE HAVE YOUR LABS DONE PRIOR TO THIS APPOINTMENT          If you need a refill on your cardiac medications before your next appointment, please call your pharmacy.      Signed, Cynthia Dawley, MD  01/21/2018 3:55 PM    [address], [place], [place]  [address] Phone: [phone]; Fax: [phone]

## 2018-01-21 NOTE — Patient Instructions (Signed)
Medication Instructions:   Your physician recommends that you continue on your current medications as directed. Please refer to the Current Medication list given to you today.    Labwork:  PRIOR TO YOUR ONE YEAR FOLLOW-UP APPOINTMENT WITH DR NELSON TO CHECK--CMET, CBC W DIFF, TSH, AND LIPIDS--PLEASE COME FASTING TO THIS LAB APPOINTMENT     Follow-Up:  Your physician wants you to follow-up in: [place] will receive a reminder letter in the mail two months in advance. If you don't receive a letter, please call our office to schedule the follow-up appointment.  PLEASE HAVE YOUR LABS DONE PRIOR TO THIS APPOINTMENT          If you need a refill on your cardiac medications before your next appointment, please call your pharmacy.

## 2018-01-28 ENCOUNTER — Ambulatory Visit: Payer: Medicare Other | Admitting: *Deleted

## 2018-01-28 DIAGNOSIS — Z5181 Encounter for therapeutic drug level monitoring: Secondary | ICD-10-CM | POA: Diagnosis not present

## 2018-01-28 DIAGNOSIS — I480 Paroxysmal atrial fibrillation: Secondary | ICD-10-CM

## 2018-01-28 LAB — POCT INR: INR: 3.5 — AB (ref 2.0–3.0)

## 2018-01-28 NOTE — Patient Instructions (Signed)
Description   Skip today's dose, then Continue taking  1/2 tablet everyday except 1 tablet on Sundays, Wednesdays, and Fridays.  Recheck INR in 2 weeks. Call with any medication changes or if scheduled for any procedures [phone].

## 2018-02-11 ENCOUNTER — Ambulatory Visit: Payer: Medicare Other | Admitting: *Deleted

## 2018-02-11 DIAGNOSIS — Z5181 Encounter for therapeutic drug level monitoring: Secondary | ICD-10-CM | POA: Diagnosis not present

## 2018-02-11 DIAGNOSIS — I480 Paroxysmal atrial fibrillation: Secondary | ICD-10-CM | POA: Diagnosis not present

## 2018-02-11 LAB — POCT INR: INR: 2.8 (ref 2.0–3.0)

## 2018-02-11 NOTE — Patient Instructions (Signed)
Description   Continue taking  1/2 tablet everyday except 1 tablet on Sundays, Wednesdays, and Fridays.  Recheck INR in 4 weeks. Call with any medication changes or if scheduled for any procedures [phone].

## 2018-02-12 ENCOUNTER — Ambulatory Visit: Payer: Medicare Other | Admitting: Pulmonary Disease

## 2018-02-12 DIAGNOSIS — Z1231 Encounter for screening mammogram for malignant neoplasm of breast: Secondary | ICD-10-CM | POA: Diagnosis not present

## 2018-02-13 ENCOUNTER — Ambulatory Visit: Payer: Medicare Other | Admitting: Pulmonary Disease

## 2018-02-13 ENCOUNTER — Encounter: Payer: Self-pay | Admitting: Pulmonary Disease

## 2018-02-13 ENCOUNTER — Other Ambulatory Visit: Payer: Medicare Other

## 2018-02-13 ENCOUNTER — Other Ambulatory Visit (INDEPENDENT_AMBULATORY_CARE_PROVIDER_SITE_OTHER): Payer: Medicare Other

## 2018-02-13 VITALS — BP 132/76 | HR 67 | Temp 97.8°F | Ht 63.0 in | Wt 145.0 lb

## 2018-02-13 DIAGNOSIS — M8580 Other specified disorders of bone density and structure, unspecified site: Secondary | ICD-10-CM

## 2018-02-13 DIAGNOSIS — R413 Other amnesia: Secondary | ICD-10-CM

## 2018-02-13 DIAGNOSIS — M159 Polyosteoarthritis, unspecified: Secondary | ICD-10-CM

## 2018-02-13 DIAGNOSIS — E039 Hypothyroidism, unspecified: Secondary | ICD-10-CM

## 2018-02-13 DIAGNOSIS — K5730 Diverticulosis of large intestine without perforation or abscess without bleeding: Secondary | ICD-10-CM

## 2018-02-13 DIAGNOSIS — I10 Essential (primary) hypertension: Secondary | ICD-10-CM

## 2018-02-13 DIAGNOSIS — E782 Mixed hyperlipidemia: Secondary | ICD-10-CM | POA: Diagnosis not present

## 2018-02-13 DIAGNOSIS — I480 Paroxysmal atrial fibrillation: Secondary | ICD-10-CM | POA: Diagnosis not present

## 2018-02-13 DIAGNOSIS — R7301 Impaired fasting glucose: Secondary | ICD-10-CM

## 2018-02-13 DIAGNOSIS — D126 Benign neoplasm of colon, unspecified: Secondary | ICD-10-CM

## 2018-02-13 DIAGNOSIS — M150 Primary generalized (osteo)arthritis: Secondary | ICD-10-CM

## 2018-02-13 DIAGNOSIS — H3530 Unspecified macular degeneration: Secondary | ICD-10-CM

## 2018-02-13 LAB — BASIC METABOLIC PANEL
BUN: 22 mg/dL (ref 6–23)
CO2: 35 mEq/L — ABNORMAL HIGH (ref 19–32)
Calcium: 9.7 mg/dL (ref 8.4–10.5)
Chloride: 100 mEq/L (ref 96–112)
Creatinine, Ser: 0.95 mg/dL (ref 0.40–1.20)
GFR: 59.66 mL/min — AB (ref >60.00)
GLUCOSE: 198 mg/dL — AB (ref 70–99)
Potassium: 4.4 mEq/L (ref 3.5–5.1)
SODIUM: 140 meq/L (ref 135–145)

## 2018-02-13 LAB — HEMOGLOBIN A1C: Hgb A1c MFr Bld: 6.6 % — ABNORMAL HIGH (ref 4.6–6.5)

## 2018-02-13 LAB — TSH: TSH: 0.41 u[IU]/mL (ref 0.35–4.50)

## 2018-02-13 NOTE — Patient Instructions (Signed)
Today we updated your med list in our EPIC system...    Continue your current medications the same...  Today we checked your follow up DM & thyroid labs...    We will contact you w/ the results when available...   Remember to follow a low carb (no sweets) and LOW FAT DIET...    Continue the PICKLE BALL!!!  Call for any questions or if I can be of service in any way...    It has been my pleasure being your doctor over these many years!!!  We will attempt to get you set up to see Irwin's doctor- Dr. [name] at [facility] for Jan2020.Marland KitchenMarland Kitchen

## 2018-02-13 NOTE — Progress Notes (Addendum)
Subjective:    Patient ID: Cynthia Mathis, female    DOB: [birth year], 83 y.o.   MRN: [id number]  HPI 84 y/o WF here for a follow up visit... she has mult med problems as noted below ...  ~  SEE PREV EPIC NOTES FOR OLDER DATA >>   ~  sees DrPerry for GI- divertics, polyps, hems & a bout of diverticulitis in Mar08...  ~  sees Dr Risa Grill Urology w/ microscopic hematuria and a neg eval 5/08... 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 ~  sees DrLLomax for Derm w/ alopecia- offered Rogaine but decided against it... ~  Sees DrPenumali for Neuro w/ memory loss, early stage dementia...   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  September 17, 2012:  concerned about her memory; husb has been under the care of DrLove for >75yr w/ mild cognitive impairment  LABS 3/14:  Chems- wnl w/ BS=106, A1c=6.2;  TSH=0.37;  Sed=14...    January 23, 2013:  visit w/ DrPenumalli 3/14> her MMSE was 29/30 & he planned to check MRI Brain & B12 level, he did not rec starting meds yet (husb takes Razadyne & feels it helps him).  ADDENDUM 04/15/13 >> pt called & wants off Simva20 after reading something about memory loss=> change to Atorva20 (she never did).  CXR 1/15 showed heart at upper lim of norm, lungs clear (mild dev trachea to right c/w enlarged left lobe of thyroid- no change).  EKG 1/15 showed SBrady, rate44, NSSTTW changes.  LABS 1/15:  FLP- at goals on Simva20;  Chems- wnl;  CBC- wnl;  TSH=0.59;  VitD=54.  BMD 08/22/13>  Lowest Tscore= -0.9 in APspine... She is off bisphos therapy for several yrs now & remains improved...   NOTE:  She wants to try ARICEPT10 start 1/2 => 1 tab daily as trial; Pulse is low at 48 & we decided to stop ToprolXL & replace it w/ VALSARTAN160/d.  LABS, Mammogram, CXR, BMD, EKG> all reviewed ...  ~  [id number]  it took quite a bit of time getting her med  list straight- she did not know her meds, her card w/ meds written on it was old & out of date, Epic meds were not up to date either & we called Pharm- pt is reminded to bring all med bottles to every office visit..Marland KitchenMarland Kitchenhe has mult minor somatic complaints...  LABS 2/16:  FLP- at goals on Simva20;  Chems- wnl;  CBC- wnl;  TSH=0.76... ~  [id number]  Cynthia Mathis has mult somatic complaints> runny nose (offered Zyrtek & Astelin but she doesn't want more meds), cold feet, hot & sweaty, thirsty, right knee pain, stepped on a hornets nest w/ 12 stings and reaction; she also reported an episode of syncope [place] she was taken to daughter's doctor & reports that everything checked out OK- we do not have the data... She remains very active & plays "pickle ball" everyday.  CT Abd 10/01/14>  Severe diverticulosis & mild acute sigm diverticulitis + inflammed distal rectum/anus c/w acute proctitis; 177msolitary cystic lesion in body of pancreas, gallstones and atherosclerotic changes in Ao...   ~  May 06, 2015:  23m55moV & add-on appt requested by pt due to right knee pain and LBP for the last 1wk she says; no known trauma or injury, and she denies prev problems x occas soreness; she thinks that she  may have had a shot in her knee once several yrs ago; as for the back pain- it is dull, in center of back w/o radiation...     EXAM shows Afeb, VSS, O2sat=97%;  HEENT- neg, mallampati1;  Chest- clear w/o w/r/r;  Heart- RR w/o m/r/g;  Abd- soft, nontender, neg;  Ext- +crepitus in right knee, w/o c/c/e;  Neuro- intact...   XRay right knee 05/06/15> osteoarthtitis w/ medial joint space compartment narrowing with milder patellofemoral joint space compartment narrowing. Marginal osteophytes project from all 3 compartments, mild spurring along the tibial spine, no joint effusion; Bones are demineralized...  XRay LS spine 05/06/15>  Disc space narrowing at L2-3 with endplate sclerosis. Mild osteophytic  changes are seen. Diffuse aortic calcifications are noted. NAD... IMP/PLAN>>  Cynthia Mathis is c/o 1 wk hx right knee & LBP; exam shows good ROM right knee w/ crepitation; XRays indicate degen arthritis; we discussed Rx w/ Tramadol+Tylenol, neoprene sleeve for knee and rest/ heat for back; I indicated next step is to refer to Ortho for shot if symptoms don't respond to Rx...   ~  August 11, 2015:  3moROV & Cynthia Mathis is stable- c/o runny nose, LBP, shoulder pain, right knee pain; she has tried Tramadol w/o much benefit & we discussed adding topical heat, ParafonTid 7 refer to Ortho for further eval... We reviewed the following medical problems during today's office visit >>     Macular Degen> followed by DrHecker & DrRankin; she received shots MD & had bilat cat surg but we do not have notes from them...    Hearing Loss> she saw DrGore, ENT w/ audiology showing bilat SNHL...    HBP> on ASA81, Valsartan160, Verap240, HCT25; BP= 142/68 & she denies HA, CP, palpit, dizzy, SOB, edema, etc...    Hyperlipid> on Simva20; FLP 2/17 show TChol 155, TG 71, HDL 60, LDL 80; continue same meds...    Hx Impaired FBS> Hx BS in the 130 range prev, but all readings ~100 x yrs now;  Labs 2/17 showed BS=94    Hypothy> hx multinod thyroid x yrs; had thyroidectomy 1990 by DrWeatherly; she weaned off Thyroid hormone yrs ago & has remained clinically & biochem euthyroid; Labs 2/17 showed TSH= 0.68    GI- Divertics, Polyps> hx diverticulitis in 2008- followed by DrPerry; last colonoscopy 5/16 showed severe diverticulosis w/o inflamm or polyps seen...    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 Labs 2/17 showed Hg= 14.6 & prev LFTs are WNL.    DJD, Osteoporosis> on Ca, MVI, Glucosamine; she uses OTC analgesic meds as needed; see BMDs below- on Bisphos drug holiday since 7/11 & due for f/u BMD... Labs 2/17 w/ VitD= 44    Memory Loss> eval by  [facility] 4/14, DrPenumalli; felt to have mild cognitive impairment vs normal aging, then w/ progressive impairment & they have her on Donepezil10 & Namenda10Bid now... EXAM shows Afeb, VSS, O2sat=98%;  HEENT- neg, mallampati1;  Chest- clear w/o w/r/r;  Heart- RR w/o m/r/g;  Abd- soft, nontender, neg;  Ext- +crepitus in right knee, w/o c/c/e;  Neuro- intact...   LABS 08/11/15>  FLP- all parameters at goals on Simva20;  Chems- wnl;  CBC- wnl;  TSH=0.68;  VitD=44...  IMP/PLAN>>  Cynthia Mathis is stable- we discussed trying heat, Tramadol, Parafon for her arthritic & LBP complaints; we will refer to Ortho; rec routine ROV in 639mo.   ~  February 09, 2016:  9m71mo  ROV & Cynthia Mathis is stable overall but persists w/ mult somatic complaints like her runny nose (Astelin rx- offered alternative but she does not want new meds); she still has LBP, shoulder pain, knee pain- on Parafon, Tramadol, & heat but she did not go to Ortho for further eval as rec last visit; she is also c/o difficulty hearing & has hearing aides [facility] but says it isn't right- offered 2nd opinion from ENT & she will let me know;  She also c/o noct leg cramps- we reviewed the usual recs for tonic water vs yellow mustard;  Finally she is also c/o visual difficulties- she had cat surg by DrHecker, and is followed by retina specialist DrRankin w/ macular degen s/p shots...    Macular Degen> followed by DrHecker & DrRankin; she received shots for MD & had bilat cat surg but we do not have notes from them...    Hearing Loss> she saw DrGore, ENT w/ audiology showing bilat SNHL, she has bilat hearing aides but notes that they are not right...    HBP> on ASA81, Valsartan160, Verap240, HCT25; BP= 132/70 & she denies HA, CP, palpit, dizzy, SOB, edema, etc...    Hyperlipid> on Simva20; FLP 2/17 show TChol 155, TG 71, HDL 60, LDL 80; continue same meds...    Hx Impaired FBS> Hx BS in the 130 range prev, but all readings ~100 x yrs now on diet alone;  Labs 2/17  showed BS=94    Hypothy> hx multinod thyroid x yrs; had thyroidectomy 1990 by DrWeatherly; she weaned off Thyroid hormone yrs ago & has remained clinically & biochem euthyroid; Labs 2/17 showed TSH= 0.68    GI- Divertics, Polyps> hx diverticulitis in 2008- followed by DrPerry; last CT Abd 09/2014 w/ severe diverticulosis & mild acute sigmoid diverticulitis- treated w/ Cipro/Flagyl & resolved; f/u colonoscopy 5/16 showed severe diverticulosis w/o inflamm or polyps seen, advised Miralax etc...    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 Labs 2/17 showed Hg= 14.6 & prev LFTs are WNL.    GYN- DrRichardson    DJD, Osteoporosis> on Ca, MVI, Glucosamine; she uses OTC analgesic meds as needed; see BMDs below- on Bisphos drug holiday since 7/11 & due for f/u BMD... Labs 2/17 w/ VitD= 44    Memory Loss> eval by [facility] 4/14, DrPenumalli & recent f/u 12/2015 (MMSE=26) felt to have mild cognitive impairment vs normal aging, then w/ progressive impairment & they have her on Donepezil10 & Namenda10Bid.  EXAM shows Afeb, VSS, O2sat=98%;  HEENT- neg, mallampati1;  Chest- clear w/o w/r/r;  Heart- RR w/o m/r/g;  Abd- soft, nontender, neg;  Ext- +crepitus in right knee, w/o c/c/e;  Neuro- early dementia, no focal neuro changes...  IMP/PLAN>>  Deema has mult somatic complaints and we discussed each (again) & I wonder about relation to her dementia; she will continue current meds and we reviewed tonic water vs yellow mustard for the noct leg cramps & wrote this down on her AVS; we plan ROV in 24mow/ f/u CXR, EKG, Fasting labs... Note: >50% of this 270m visit was spent in counseling & coordination of care...  ~  August 14, 2016:  32m20moV & Mrs RetBerdine Addisons seen in the ER 06/29/16 with palpitations> EKG showed Afib w/ rvr 138, then resolved spont in ER to NSR, Exam was otherw unremarkable, CHADsVASC score=5; CXR- NAD and LABS- ok...  She was placed on Eliquis2.5Bid & Propranolol  37m prn palpit & asked to f/u w/ CARDS;  She had f/u appt here w/ PA- Tammy Parrett on 07/14/16> pt reported doing well- no recurrent palpit, no CP/ SOB/ etc, doing well on Eliquis w/o bleeding/ bruising/ etc; f/u EKG showed holding SBrady, rate 54 => she has a [facility]DS appt w/ DrNelson 08/24/16...    Her CC= constant right knee pain => Rx w/ OTC Advil/ Aleve / Tylenol prn & we will refer to Ortho for further eval... EXAM shows Afeb, VSS, O2sat=98%;  HEENT- neg, mallampati1;  Chest- clear w/o w/r/r;  Heart- RR w/o m/r/g;  Abd- soft, nontender, neg;  Ext- +crepitus in right knee, w/o c/c/e;  Neuro- early dementia, no focal neuro changes...   CXR 06/29/16>  Norm heart size, clear lungs- NAD, thoracic kyphosis & spondylosis...   EKG 06/29/16>  AFlutter w/ variable conduction, HR=137;  Converted spont to NSR rate 67, EKG otherw wnl...  LABS 06/2016 in Epic>  CBC/ Chems- reviewed & ok;  TSH=0.45 IMP/PLAN>>  Cynthia Mathis had episode of AFlutter w/ rvr & converted spont to NSR, started on Eliquis2.5Bid for ChadsVasc scaore of 5; she has yet to see cards but appt w/ DrNelson set for 08/24/16... Her CC is right knee pain & we will send to Ortho for assessmant...  ADDENDUM>>  She saw DrNelson 08/24/16 HBP, HL, palpit=> episode of PAF w/ rvr 06/2016, spont coverted to NSR in ER w/o intervention, started on Eliquis2.5Bid, no recurrent palpit since then; Eliquis was too expensive & she was switched to Coumadin;  EKG showed SBrady, otherw wnl;  2DEcho performed 09/11/16=> mod focal basilar hypertrophy, norm sys function w/ EF=60-65%, no regional wall motion abn, Gr1DD, AoV mildly thickened calcif leaflets- no AS, mild AI; norm MV, normal LA at 365m norm RV, PAsys=33...  ~  Nov 20, 2016:  15m39moV & add-on appt requested for abd discomfort> She called last wk c/o 2wk hx abd discomfort, mid abd area, not feeling well, but denied n/v/d/ blood seen & no f/c/s etc; she declined to be  seen in ER/ urgent care & set up this appt to see me today;  She is followed for GI by DrPerry w/ hx severe diverticulosis on her last colon 11/2014 and a bout of clinical diverticulitis in 2008 and CT Abd in 09/2014 w/ severe divertics and mild acute sigm diverticulitis treated w/ Flagyl/ Cipro;  SHE FEELS THAT HER SYMPTOMS HAVE IMPROVED OVER THE LAST WEEK ON GLUTEN FREE DIET-- and she tells us Korear the 1st time that several relatives have Celiac disease (her grandmother, her brother, and her son) => we agreed to check Tissue Transglutaminase Antibody test (=> NEG) but her symptoms sound more like IBS-c & we will treat w/ Miralax as rec by DrPerry in the past, plus Bentyl 51m77md prn for abd discomfort...     She is otherw stable on Coumadin for hx PAF, BP controlled on Verap240, Diovan160, HCT25 and electrolytes are wnl...     She remains on Aricept & Namenda and pt & her husb are doing as well as can be expected given their advanced age & underlying issues... EXAM shows Afeb, VSS, O2sat=95%;  HEENT- neg, mallampati1;  Chest- clear w/o w/r/r;  Heart- RR w/o m/r/g;  Abd- soft, nontender, neg;  Ext- +crepitus in right knee, w/o c/c/e;  Neuro- early dementia, no focal neuro changes...   LABS 11/20/16>  Chems- ok w/ BS=116, Cr=0.92, LFTs- wnl;  CBC- wnl w/ Hg=14.5, wbc=9.9;  Sed rate=6;  Tissue Transglutaminase Ab=  NEG...  IMP/PLAN>>  Cynthia Mathis has known severe diverticulosis, hx hyperplastic polyps, hx hems;  Her symptoms sound like mild IBS & DrPerry prev rec Miralax to her but she is not using any of this;  Rec to take Miralax daily & adjust amt to keep stools soft & easy to pass w/o straining;  Plus Bentyl 56m Qid as needed for abd discomfort & cramps...  ~  February 12, 2017:  325moOV & general medical follow up visit>  Cynthia Mathis returns feeling well, prev abdominal issues resolved & have not returned, she has stopped the gluten-free diet & back to eating what she wants;  She [facility]. We reviewed the following medical problems during today's office visit >>     Macular Degen> followed by DrHecker & DrRankin; she received shots for MD & had bilat cat surg & Glaucoma suspect; [facility]...    Hearing Loss> she saw DrGore, ENT w/ audiology showing bilat SNHL, she has bilat hearing aides but notes that they are not right...    HBP> on ASA81, Valsartan160, Verap240, HCT25; BP= 140/80 by me today & she denies HA, CP, palpit, dizzy, SOB, edema, etc; [facility] ~120...    Hx PAF on Coumadin followed in CC> ER 12/17 w/ palpit & PAF w/ RVR, converted spont to NSR 7placed on coumadin...    Hyperlipid> on Simva20; FLP 8/18 shows TChol 184, TG 157, HDL 52, LDL 100; continue same meds, stress compliance + better diet...    Hx Impaired FBS> Hx BS in the 130 range prev, but all readings ~100 x yrs now on diet alone;  Labs 8/18 showed BS=106, A1c=6.4    Hypothy> hx multinod thyroid x yrs; had thyroidectomy 1990 by DrWeatherly; she weaned off Thyroid hormone yrs ago & has remained clinically & biochem euthyroid; Labs 8/18 showed TSH= 0.65    GI- Divertics, Polyps> hx diverticulitis in 2008- followed by DrPerry; last CT Abd 09/2014 w/ severe diverticulosis & mild acute sigmoid diverticulitis- treated w/ Cipro/Flagyl & resolved; f/u colonoscopy 5/16 showed severe diverticulosis w/o inflamm or polyps seen, advised Miralax etc...    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 We checked her Tissue Transglutaminase Abs-IgG&IgA (both of which were NEG)...  Labs 2/17 showed Hg= 14.6 & prev LFTs are WNL.    GYN- DrRichardson    DJD, Osteoporosis> on Ca, MVI, Glucosamine; she uses OTC analgesic meds as needed; see prev BMDs below- Tscore -3.2 in Spine 2001 &  started on Fosamax w/ improved scores on Rx; on Bisphos drug holiday since 7/11 & f/u BMD 08/2013 showed normal Tscores- she remains on Ca, MVI, VitD supplements...    Memory Loss> eval by [facility]- DrPenumalli & neuro f/u periodically (MMSE=24/30 [phone] to have mild cognitive impairment, then w/ progressive impairment & they have her on Donepezil10 & Namenda10Bid.  EXAM shows Afeb, VSS, O2sat=96%;  HEENT- neg, mallampati1;  Chest- clear w/o w/r/r;  Heart- RR w/o m/r/g;  Abd- soft, nontender, neg;  Ext- +crepitus in right knee, w/o c/c/e;  Neuro- early dementia, no focal neuro changes...   LABS 02/13/17 shows>  FLP- ok but reminded to take med every day;  Chems- ok w/ BS=106, A1c=6.4;  CBC- wnl w/ Hg=14.4;  TSH=0.65 IMP/PLAN>>  Cynthia Mathis feels that she is doing well- no new complaints or concerns;  She requests refill Rxs for 90d supplies;  She will ret for FASTING blood work (labs ok) & continue her protime checks in the coumadin clinic... We plan rov in 65mo soooner if needed for problems...   ~  August 15, 2017:  627moOV & Cynthia Mathis is holding up well & tells me that her husb recently had TAVR surg & although he's given up the pickle ball she wants me to know that6 she is still playing! She is doing satis- no new complaints or concerns, denies CP/ palpit/ SOB/ edema; notes min cough small amt clear phlegm, no f/c/s...     She saw Neuro-NMartin,NP 03/27/17>  Hx MCI w/ MMSE 24/30 (slow deterioration), on Aricept & Namenda- tol well; she feels stable, still drives and hasn't gotten lost...    She saw CARDS- DrNelson 07/23/17>  HBP, PAF & converted spont w/o known recurrence & no changes made...     BP controlled on Losartan100, CalanSR240, HCT25; BP= 130/68 and she denies HA, visual sx, CP, palpit, SOB, edema...    Hx transient PAF, holding NSR on coumadin follwed in CC...    Chol controlled on Atorva10,+ diet;  FLP 8/18 looks good, continue same...     Similarly BS's have remained  acceptable on diet alone... EXAM shows Afeb, VSS, O2sat=97%;  HEENT- neg, mallampati1;  Chest- clear w/o w/r/r;  Heart- RR w/o m/r/g;  Abd- soft, nontender, neg;  Ext- +crepitus in right knee, w/o c/c/e;  Neuro- early dementia, no focal neuro changes...  IMP/PLAN>>  Cynthia Mathis is stable on her current regimen & we reviewed her prob list & meds- all questions answered;  We also gave her the Prevnar-13 pneumonia shot today & this gets her up to date...    ~  Dec 05, 2017:  17m617moV & add-on appt requested by pt due to right lateral foot pain x 2wks; no known trauma, still playing pickle ball, just started hurting, hasn't tried any Tylenol or Ibuprofen, on Coumadin for hx PAF, no bruising etc... She is sl tender on lateral aspect of right foot but there are no lesions, color is wnl, pulses intact, etc... XRay today is unremarkable...  EXAM shows Afeb, VSS, O2sat=95%;  HEENT- neg, mallampati1;  Chest- clear w/o w/r/r;  Heart- RR w/o m/r/g;  Abd- soft, nontender, neg;  Ext- +crepitus in right knee, w/o c/c/e;  Rt foot w/ normal inspection, sl tender lateral aspect w/ o lesions noted;  Neuro- early dementia, no focal neuro changes...   XRay right foot 12/05/17>  No acute fracture subluxation, dislocation; small calcaneal spur noted; no abnormality seen over the area of tenderness at the 5th metatarsal bone...  IMP/PLAN>>  We discussed supportive rx w/ rest, ice, wrap/compression, Tylenol, and avoid trauma etc;  Ok to walk as needed since this does not produce pain;  Call if pain persists or gets worse for Ortho referral & consideration of MRI foot...   ~  February 13, 2018:  65mo71mo & general medical follow up visit- actually this is the regular 78mo 49mosince her last check up 08/15/17; she was seen as an add-on appt 5/29 c/o right foot pain w/ sl tenderness on palpation but neg XRay & she was advised topical therapy/ Tylenol/ etc and today she can not even recall that visit & has no right foot complaints...     She  continues to follow w/ the Coumadin Clinic regularly  for her hx PAF...    She saw CARDS- DrNelson on 01/21/18> HBP, HL, PAF (presented to ER 06/2016 w/ AF and rvr; she spont converted to NSR, started on Eliquis but switched to Coumadin due to cost, she denies palpit since this one episode; plays pickle ball for 1-2h daily, no CP etc. We reviewed the following medical problems during today's office visit>    Macular Degen> followed by DrHecker & DrRankin; she received shots for MD & had bilat cat surg & Glaucoma suspect; last note from [facility] 06/2016 is reviewed...    Hearing Loss> she saw DrGore, ENT w/ audiology showing bilat SNHL, she has bilat hearing aides but notes that they are not right...    HBP> on ASA81, Losartan100, Verap240, HCT25; BP= 132/76 by me today & she denies HA, CP, palpit, dizzy, SOB, edema, etc; she [facility] are all ~120...    Hx PAF on Coumadin followed in [facility]> ER 12/17 w/ palpit & PAF w/ RVR, converted spont to NSR & placed on coumadin due to cost issues w/ Eliquis...    Hyperlipid> on Atorva20; [place] 7/19 shows TChol 187, TG 255, HDL 44, LDL 92; continue same meds, stress compliance + better diet...    Hx Impaired FBS> Hx BS in the 130+ range prev, but all A1c readings in the 6's;  Labs 8/18 showed BS=106, A1c=6.4;  Labs 8/19 shows BS=198, A1c-6.6 and we reviewed diet/ low carb/ low fat...    Hypothy> hx multinod thyroid x yrs; had thyroidectomy 1990 by DrWeatherly; she weaned off Thyroid hormone yrs ago & has remained clinically & biochem euthyroid; Labs 8/18 showed TSH= 0.65;  Labs 8/19 shows TSH=0.41    GI- Divertics, Polyps> hx diverticulitis in 2008- followed by DrPerry; last CT Abd 09/2014 w/ severe diverticulosis & mild acute sigmoid diverticulitis- treated w/ Cipro/Flagyl & resolved; f/u colonoscopy 5/16 showed severe diverticulosis w/o inflamm or polyps seen, advised Miralax etc...    FamHx Hemochromatosis & Celiac dis> she is heterozygous for the C282Y  mutation; daugh in [place] was Dx w/ hemochromatosis on phlebotomy rx; she tells me that her son has Celaic dis & ?several other family members? We checked her Tissue Transglutaminase Abs-IgG&IgA (both of which were NEG)...  Labs 2/17 showed Hg= 14.6 & prev LFTs are WNL.    GYN- DrRichardson    DJD, Osteoporosis> on Ca, MVI, Glucosamine; she uses OTC analgesic meds as needed; see prev BMDs below- Tscore -3.2 in Spine 2001 & started on Fosamax w/ improved scores on Rx; on Bisphos drug holiday since 7/11 & f/u BMD 08/2013 showed normal Tscores- she remains on Ca, MVI, VitD supplements...    Memory Loss> eval by [facility]- DrPenumalli & neuro f/u periodically (MMSE=24/30 [phone]) felt to have mild cognitive impairment, then w/ progressive impairment & they have her on Donepezil10 & Namenda10Bid now... EXAM shows Afeb, VSS, O2sat=98%;  HEENT- neg, mallampati1;  Chest- clear w/o w/r/r;  Heart- RR w/o m/r/g;  Abd- soft, nontender, neg;  Ext- +crepitus in right knee, w/o c/c/e;  Rt foot w/ normal inspection, sl tender lateral aspect w/ o lesions noted;  Neuro- early dementia, no focal neuro changes...   LABS 01/2018 in Epic>  FLP- TChol 187, TG 255, HDL 44, LDL 92;  Chems- ok x BS=161;  CBC- ok w/ Hg=15.1  LABS 02/13/18>  Bmet- ok x TCO2=35 & BS=198, A1c=6.6;  TSH=0.41... IMP/PLAN>>  Cynthia Mathis has mult medical issues as noted above; she is stoic & w/o complaints; dementia  is slowly progressive & followed by Neuro on Aricept+Namenda; continues to be active playing pickleball daily; continue same meds & better diet stressed; we discussed my up-coming retirement=> she will request appt w/ her husbands PCP- DrFry at [facility]...           Problem List:  MACULAR DEGENERATION (ICD-362.50) - eval by DrHecker w/ foveal telangiectasia, macular drusen, macular degeneration, and cataracts- being followed regularly... vision is preserved so far... ~  11/11:  f/u DrHecker- stable age related mac degen, no  retinopathy. ~  11/12:  f/u DrHecker- has advanced bilat AMD, sent to [facility], we don't have his notes... ~  She has been getting shots in the eyes for macular degen, we do not have notes from [facility]... ~  2/16: she tells me that she continues to receive the shots; she is also sched for bilat cat surg by DrHecker... ~  She continues to f/u w/ DrHecker- s/p bilat cats, glaucoma suspect, AMD; last note 12/17 reviewed;  She also sees DrRankin but we do not have notes from him...  HYPERTENSION (ICD-401.9) >>  ~  on VERAPAMIL 265m/d,  TOPROL-XL 224md,  HCTZ 2554m... Prev on Lisinopril but this was stopped due to throat clearing tic & mild cough, plus K=6.0 at the time (all improved off the ACE)... ~  8/10: borderline BP here and she will monitor at home carefully & call if not <150/90. ~  7/11: she reports that she incr the Lisinopril to 1&1/2 tabs due to BP~150+, now better... ~  7/12:  BP stable on Verap240 & Lisinopril 59m2m BP= 140/68 today> denies HA, fatigue, visual changes, CP, palipit, dizziness, syncope, dyspnea, edema, etc... ~  1/13:  BP= 160/90 (didn't take med today); states BP are ok at home; continue same meds + low sodium etc... ~  CXR 1/13 showed norm heart size, clear lungs, mild thor spondylosis, NAD... ~[name] 7/13:  BP= 152/82 & she notes some troublesome throat clearing & mild cough; we decided to STOP the ACE & switch to LOSARTAN 100mg82mmonitor her symptoms and her blood presure... ~  CXR 9/13 showed calcif Ao, clear lungs, suspect left thyroid enlargement, DJD in Tspine... ~  EKG 10/13 showed SBrady, rate58, wnl, NAD... ~  11/13: she had long convoluted hx of BP med changes- see above- now on Verap240, MetopER25, HCT25, & BP= 126/72; we discussed options but she is content to continue these same meds for now & watch BP... ~  3/14: on ASA81, Metop25, Verap240, HCT25; BP= 140/80 & she denies HA, CP, palpit, dizzy, SOB, edema, etc. ~  1/15: on ASA81, Metop25, Verap240, HCT25;  BP= 140/82 & she remains essen asymptomatic; she is bradycardic & we decided to stop ToprolXL & replace it w/ [id number] 1/15 showed heart at upper lim of norm, mild dev of trach to right, clear lungs, NAD...  ~  EKG 1/15 showed SBrady, rate44, NSSTTWA, NAD... ~ [name]7/15: on ASA81, Diovan160, Verap240, HCT25; BP= 120/60 & she is essentially asymptomatic, pulse=60, continue same meds... ~  2/16: on same meds & BP= 128/76, she remains asymptomatic... ~  BP controlled on Diovan160, Verapamil240, HCT25; they monitor BP at home & [facility]...  PAF >> she had bout of palpit & went to the ER 06/2016 found to be in AFib w/ rvr => spont converted to NSR w/o known recurrence; started on Eliquis2.5Bid & set up to see CARDS. ~  08/2016> she was eval by DrNelson-- changed to Coumadin due to $$  issues and 2DEcho showed mod focal basilar hypertrophy, norm sys function w/ EF=60-65%, no regional wall motion abn, Gr1DD, AoV mildly thickened calcif leaflets- no AS, mild AI; norm MV, normal LA at 73m, norm RV, PAsys=33...  DYSLIPIDEMIA (ICD-272.4) - on ZOCOR 276md... plus doing well on diet... ~  FLPal[place]/09 showed TChol 144, TG 140, HDL 39, LDL 77... continue same. ~  FLP 2/10 showed TChol 164, TG 77, HDL 54, LDL 95 ~  FLP 8/10 showed TChol 138, TG 51, HDL 48, LDL 80 ~  FLP 1/11 showed TChol 155, TG 100, HDL 48, LDL 87 ~  FLP 7/11 showed TChol 158, TG 74, HDL 55, LDL 89 ~  FLP 1/12 showed TChol 155, TG 101, HDL 50, LDL 85 ~  FLP 1/13 on Simva20 showed TChol 169, TG 118, HDL 56, LDL 89  ~  FLP 1/15 on Simva20 showed TChol 155, TG 122, HDL 50, LDL 81 ~  FLP 2/16 on Simva20 showed TChol 161, TG 107, HDL 61, LDL 79; continue same meds. ~  She wanted off Simvastatin & was switched to Atorva10 ~  FLMcFarlan/18 on Atorva10 showed   Hx Impaired Fasting Glucose >> on diet alone... FBS @ home betw 120-130 recently... she is on a good diet and weight is down at 139# today (63"  BMI= 24)... ~  labs 3/09 showed FBS= 119...  subseq HgA1c 5/09 was 6.4... diet Rx. ~  labs 8/09 showed BS= 106, HgA1c= 5.9...Marland KitchenMarland Kitchenec- same. ~  labs 2/10 showed BS= 102, A1c= 5.9 ~  labs 1/11 showed BS= 98 ~  labs 1/12 showed BS= 95 ~  Labs 1/13 showed BS= 101 ~  Labs 3/14 showed BS=106, A1c=6.2 ~  Labs 1/15 showed BS= 103 ~  Labs 2/16 showed FBS= 99  HYPOTHYROIDISM (ICD-244.9) - off Synthroid now...  clinically & biochemically euthyroid... [place] she had an eval by DrKohut in 1987 w/ familial multinodular goiter w/ neg FNA... started on Rx but goiter enlarged- had thyroidectomy 1990 by DrWeatherly... ~  labs 3/09 showed TSH = 0.35 (0.35-5.5)... we have slowly decr her dose to 503md... ~  labs 8/09 showed TSH= 0.33... rec- decr the Synth50 to 1/2 tab daily. ~  labs 2/10 showed =TSH= 0.54 and Synthroid stopped... ~  labs 4/10 off med showed TSH= 0.59 ~  labs 8/10 showed TSH= 0.60 ~  labs 1/11 showed TSH= 0.60 ~  labs 7/11 showed TSH= 0.56 ~  labs 1/12 showed TSH= 0.60 ~  Labs 1/13 showed TSH= 0.58 ~  Labs 3/14 showed TSH= 0.37 ~  Labs 1/15 showed TSH= 0.59 ~  Labs 2/16 showed TSH= 0.76  DIVERTICULOSIS OF COLON (ICD-562.10) COLONIC POLYPS (ICD-211.3) - followed by DrPerry... bout of diverticulitis in Mar08... ~  last colonoscopy 3/08 showed divertics, 2mm35mlyp, hems... path=hyperplastic, f/u planned 73yrs96yr F/u colonoscopy 4/13 by DrPerry showed severe diverticulosis w/o inflamm and w/o polyps etc... ~  2016:  CT Abd 09/2014 w/ severe diverticulosis & mild acute sigmoid diverticulitis- treated w/ Cipro/Flagyl & resolved; f/u colonoscopy 5/16 showed severe diverticulosis w/o inflamm or polyps seen, advised Miralax etc...  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 ~  Labs 11/13:  CBC- wnl w/ Hg=14.5;  Fe=72 (25%sat);  Hemochromatosis DNA- heterozygous for the C282Y  mutation...  HEMATURIA, HX OF (ICD-V13.09) - eval 4/08 by DrGrapey w/ neg Sonar, Cysto,  cytology...  GYN >> DrKRichardson, Mammograms at [facility] (NEG- 4/15 study)...  DEGENERATIVE JOINT DISEASE (ICD-715.90) - c/o discomfort in her shoulders and knees... uses OTC meds as needed. ~  04/2015:  She presented w/ 1wk hx right knee & LBP; exam showed crepitation in right knee but good ROM & SLR was neg; Rec to use Tramadol+Tylenol, neoprene support sleeve for knee & rest/heat/ exercises for back; we will refer to Ortho for shots if not responding...   Hx of OSTEOPOROSIS (ICD-733.00) - on Fosamax 70/wk thru 7/11, Caltrate 1/d, MVI, Vit D 1000/d... ~  initial BMD 10/01 showed TScores -0.2  to -3.2 in the spine...  ~  f/u studies w/ steady improvement on Fosamax therapy... ~  BMD 3/09 w/ TScores -0.1 to -1.7 in the spine... continue Rx. ~  BMD 7/11 showed TScores -2.0 Spine, and -0.3 in right FemNeck... we opted for Bisphos drug holiday to start now. ~  Labs 1/13 showed Vit D level = 48, continue same... ~  BMD 2/15 showed WNL Tscores; rec to continue calcium, MVI, VitD, wt bearing exercise...  PROGRESSIVE MEMORY LOSS >>  ~  MMSE 5/09 by Doran Heater, NP was 29/30... Reassured. ~  3/14: she presented w/ c/o worsening memory & MMSE has diminished- refer to [facility] for eval & rx... ~  Neuro eval 4/14 by DrPenumalli> mild cognitive impairment vs norm aging, he did not rec starting meds... ~  CT Brain 4/14 showed mild atrophy & sm vessel dis ~  She continues to f/u w/ Neurology periodically... ~  1/15: ROV here & husb more concerned re her memory, recall was 0/3 after distraction; husb has apparently done well on Aricept & she wants trial, start ARICEPT5=>10 daily... ~  She has been followed by DrPenumalli & they added Namenda 10Bid to her Aricept10...  ANXIETY (ICD-300.00) - prev on Alpraz Prn but hasn't used in yrs...  DERMATOLOGY >> LIPOMA (ICD-214.9) - notes large lipoma in RLQ/ rt flank area...  some discomfort noted... seen by DrLeone in the past w/o surg... she saw DrCornett, CCS, 1/11 for second opinion & she will decide re: excision... ~  5/13:  She had Basal cell ca removed from the left side of her nose, w/ subseq deeper margins & reports everything is OK... ~  7/13:  She is concerned that the lipoma may be growing & she requests f/u DrCornett for excision... ~  10/13:  Lipoma removed by DrCornett in outpt surg 04/17/12...  SHINGLES - remote hx of right T8-9 shingles in 1986...   Past Surgical History:  Procedure Laterality Date  . APPENDECTOMY  1947  . COLONOSCOPY    . MASS EXCISION  04/17/2012   Procedure: EXCISION MASS;  Surgeon: [name], MD;  Location: [facility];  Service: General;  Laterality: N/A;  . THYROIDECTOMY  1990   [name]    Outpatient Encounter Medications as of 02/13/2018  Medication Sig  . atorvastatin (LIPITOR) 20 MG tablet Take 1 tablet (20 mg total) by mouth daily.  . Calcium Carbonate-Vitamin D (CALCIUM 600+D) 600-400 MG-UNIT per tablet Take 1 tablet by mouth daily.  . Cyanocobalamin (VITAMIN B 12 PO) Take 1 tablet by mouth daily. Reported on 12/27/2015  . dicyclomine (BENTYL) 10 MG capsule Take 1 capsule (10 mg total) by mouth 4 (four) times daily -  before meals and at bedtime.  . donepezil (ARICEPT) 10 MG tablet TAKE ONE TABLET BY MOUTH EVERY NIGHT AT BEDTIME  . fish oil-omega-3 fatty acids 1000 MG  capsule Take 1 g by mouth daily.  [name] glucosamine-chondroitin 500-400 MG tablet Take 1 tablet by mouth 3 (three) times daily.  . hydrochlorothiazide (HYDRODIURIL) 25 MG tablet Take 1 tablet (25 mg total) by mouth daily.  [name] losartan (COZAAR) 100 MG tablet Take 1 tablet (100 mg total) by mouth daily.  . memantine (NAMENDA) 10 MG tablet TAKE ONE TABLET BY MOUTH TWICE A DAY  . Multiple Vitamins-Minerals (MULTIVITAMIN & MINERAL PO) Take 1 tablet by mouth daily.  . verapamil (CALAN-SR) 240 MG CR tablet Take 1 tablet (240 mg total) by  mouth at bedtime.  [name] warfarin (COUMADIN) 5 MG tablet TAKE AS DIRECTED BY COUMADIN CLINIC   No facility-administered encounter medications on file as of 02/13/2018.     Allergies  Allergen Reactions  . Epinephrine Palpitations    REACTION: heart racing    Immunization History  Administered Date(s) Administered  . DTP 04/17/2000  . Influenza Split 05/20/2011, 05/15/2012  . Influenza Whole 05/02/2010  . Influenza, High Dose Seasonal PF 05/08/2013, 05/25/2017  . Influenza,inj,Quad PF,6+ Mos 05/25/2014, 06/29/2015, 06/13/2016  . Pneumococcal Conjugate-13 08/15/2017  . Pneumococcal Polysaccharide-23 04/17/2000  . Tdap 08/04/2013    Current Medications, Allergies, Past Medical History, Past Surgical History, Family History, and Social History were reviewed in [employer] record.    Review of Systems        See HPI - all other systems neg except as noted...  The patient complains of dyspnea on exertion.  The patient denies anorexia, fever, weight loss, weight gain, vision loss, decreased hearing, hoarseness, chest pain, syncope, peripheral edema, prolonged cough, headaches, hemoptysis, abdominal pain, melena, hematochezia, severe indigestion/heartburn, hematuria, incontinence, muscle weakness, suspicious skin lesions, transient blindness, difficulty walking, depression, unusual weight change, abnormal bleeding, enlarged lymph nodes, and angioedema.   Objective:   Physical Exam    WD, WN, 84 y/o WF in NAD... VITAL SIGNS:  Reviewed... GENERAL:  Alert & oriented; pleasant & cooperative... HEENT:  [place]/AT, EOM-wnl, PERRLA, EACs-clear, TMs-wnl, NOSE-clear, THROAT-clear & wnl. NECK:  Supple w/ fair ROM; no JVD; normal carotid impulses w/o bruits; scar of prev thyroid surg- no nodules or goiter; no lymphadenopathy. CHEST:  Clear to P & A; without wheezes/ rales/ or rhonchi. HEART:  Regular Rhythm; without murmurs/ rubs/ or gallops. ABDOMEN:  Soft & nontender; normal  bowel sounds; no organomegaly or masses detected. EXT: without deformities, mild arthritic changes; no varicose veins/ venous insuffic/ or edema. prob heel spurs w/ some plantar fascia pain> advised soaks & stretching... NEURO:  CN's intact;  no focal neuro deficits... DERM:  Scar in right flank after lipoma excision... no other lesions noted...  MMSE 09/17/12>  She did not know the Pres ("I can see him, a black guy"), VP, or Gov;  Didn't know prev pres etc;  Recall 3/3 immed, but only 1/3 after distraction;  Serial 7s & WORLD backwards were fluent; proverbs were abstracted normally...  RADIOLOGY DATA:  Reviewed in the EPIC EMR & discussed w/ the patient...  LABORATORY DATA:  Reviewed in the EPIC EMR & discussed w/ the patient...   Assessment & Plan:    02/12/17>   Cynthia Mathis feels that she is doing well- no new complaints or concerns;  She requests refill Rxs for 90d supplies;  She will ret for FASTING blood work & continue her protime checks in the coumadin clinic... We plan rov in 86mo [place] if needed for problems. 08/15/17>   Cynthia Mathis is stable on her current regimen & we reviewed her  prob list & meds- all questions answered;  We also gave her the Prevnar-13 pneumonia shot today & this gets her up to date...  12/05/17>   We discussed supportive rx w/ rest, ice, wrap/compression, Tylenol, and avoid trauma etc;  Ok to walk as needed since this does not produce pain;  Call if pain persists or gets worse for Ortho referral & consideration of MRI foot 02/13/18>   Cynthia Mathis has mult medical issues as noted above; she is stoic & w/o complaints; dementia is slowly progressive & followed by Neuro on Aricept+Namenda; continues to be active playing pickleball daily; continue same meds & better diet stressed; we discussed my up-coming retirement=> she will request appt w/ her husbands PCP- DrFry at [facility].   Macular Degen>  Followed by DrHecker/ [place] & getting MD shots...  HBP>  on Verap,  Diovan, HCT- along w/ no salt; & BP appears to be well controlled & pulse improved (60/min)  ATRIAL FLUTTER EPISODE 06/2016 => spont converted, started on Eliquis2.5Bid & set up to see CARDS...  DYSLIPIDEMIA>  Stable on diet & Simva20...  Borderline DM>  Hx BS in the 120-130 range yrs ago but stable ~100 for the last 3+yrs on diet alone...  Hx Hypothyroidism>  She weaned off Synthroid several yrs ago & TSH has remained normal, clinically euthyroid as well...  GI>  Divertics, Colon Polyps>  Stable & up to date w/ f/u colon 2016 from DrPerry==> divertics, no polyps...  LIPOMA>  Removed 10/13 from RLQ area by DrCornett...  DJD>  She notes some right knee discomfort playing "pickle ball" & is advised to wear knee brace, take Aleve prn (she declines further eval or prescription meds)... 10/16>  1 wk hx right knee & LBP; exam shows good ROM right knee w/ crepitation; XRays indicate degen arthritis; we discussed Rx w/ Tramadol+Tylenol, neoprene sleeve for knee and rest/ heat for back; I indicated next step is to refer to Ortho for shot if symptoms don't respond to Rx.  Osteoporosis>  Hx BMD in 2001 w/ TScore =3.2 in Spine; Rx'd Fosamax for 12yr w/ steady improvement & on Bisphos drug holiday since 7/11 & BMD is 2015 was WNL- continue supplements w/ Calcium, MVI, Vit D...  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  MEMORY LOSS>  Followed by for mild dementia w/ MMSE 26/30- DrPenumalli on Aricept & Namenda...  Other medical problems as listed...   Patient's Medications  New Prescriptions   No medications on file  Previous Medications   ATORVASTATIN (LIPITOR) 20 MG TABLET    Take 1 tablet (20 mg total) by mouth daily.   CALCIUM CARBONATE-VITAMIN D (CALCIUM 600+D) 600-400 MG-UNIT PER TABLET    Take 1 tablet by mouth daily.   CYANOCOBALAMIN (VITAMIN B 12 PO)    Take 1 tablet by mouth daily. Reported  on 12/27/2015   DICYCLOMINE (BENTYL) 10 MG CAPSULE    Take 1 capsule (10 mg total) by mouth 4 (four) times daily -  before meals and at bedtime.   DONEPEZIL (ARICEPT) 10 MG TABLET    TAKE ONE TABLET BY MOUTH EVERY NIGHT AT BEDTIME   FISH OIL-OMEGA-3 FATTY ACIDS 1000 MG CAPSULE    Take 1 g by mouth daily.   GLUCOSAMINE-CHONDROITIN 500-400 MG TABLET    Take 1 tablet by mouth 3 (three) times daily.   HYDROCHLOROTHIAZIDE (HYDRODIURIL) 25 MG TABLET    Take 1 tablet (25 mg total) by  mouth daily.   LOSARTAN (COZAAR) 100 MG TABLET    Take 1 tablet (100 mg total) by mouth daily.   MEMANTINE (NAMENDA) 10 MG TABLET    TAKE ONE TABLET BY MOUTH TWICE A DAY   MULTIPLE VITAMINS-MINERALS (MULTIVITAMIN & MINERAL PO)    Take 1 tablet by mouth daily.   VERAPAMIL (CALAN-SR) 240 MG CR TABLET    Take 1 tablet (240 mg total) by mouth at bedtime.   WARFARIN (COUMADIN) 5 MG TABLET    TAKE AS DIRECTED BY COUMADIN CLINIC  Modified Medications   No medications on file  Discontinued Medications   No medications on file

## 2018-02-14 ENCOUNTER — Other Ambulatory Visit: Payer: Self-pay | Admitting: Diagnostic Neuroimaging

## 2018-02-25 DIAGNOSIS — M1711 Unilateral primary osteoarthritis, right knee: Secondary | ICD-10-CM | POA: Diagnosis not present

## 2018-03-12 ENCOUNTER — Ambulatory Visit: Payer: Medicare Other | Admitting: *Deleted

## 2018-03-12 DIAGNOSIS — Z5181 Encounter for therapeutic drug level monitoring: Secondary | ICD-10-CM

## 2018-03-12 DIAGNOSIS — I480 Paroxysmal atrial fibrillation: Secondary | ICD-10-CM | POA: Diagnosis not present

## 2018-03-12 LAB — POCT INR: INR: 3.4 — AB (ref 2.0–3.0)

## 2018-03-12 NOTE — Patient Instructions (Signed)
Description   Skip today's dose, then continue taking  1/2 tablet everyday except 1 tablet on Sundays, Wednesdays, and Fridays.  Recheck INR in 3 weeks. Call with any medication changes or if scheduled for any procedures [phone].

## 2018-03-27 NOTE — Progress Notes (Signed)
[facility]  PATIENT: Cynthia Mathis DOB: [birth year]   REASON FOR VISIT:  Follow-up  Mild cognitive impairment HISTORY FROM: patient and husband    HISTORY OF PRESENT ILLNESS: UPDATE 9/19/2019CM Cynthia Mathis, 84 year old female returns for follow-up with a history of mild cognitive impairment she continues to be physically active.  They participate in the senior games.  She continues to walk every day plays pickleball for 2 hours every day she continues to drive without difficulty.  She is currently on Aricept and Namenda without side effects.  Appetite is good she sleeps well.She is independent in all activities of daily living.  She scores 7 out of 8 on IADL.  Husband does the finances.  No interval medical issues.  She returns for reevaluation   UPDATE  9/18/2018CM  Patient returns for follow-up history of mild cognitive impairment. Mini-Mental Status exam table at 24 out of 30. Remains on donepezil and memantine  Without side effects. Continues walking everyday. Plays pickle ball everyday for about 2 hours.  Driving without difficulty has not gotten lost.  Appetite. Good , sleeping well.    No specific complaints she and husband both feel memory is stable.  UPDATE 12/27/15: Since last visit, doing well. Memory issues are stable. Tolerating meds (donepezil and memantine) but takes memantine only 10mg  at bedtime; day time dose caused "fogginess".   UPDATE 01/04/15: Since last visit, started memantine 10mg  BID, could not tolerate, so started taking it daily. No new events. Memory stable.   UPDATE 07/01/14: Since last visit, memory loss has progressed. Now on donepezil 10mg  qhs per Dr. Lenna Mathis. Tolerating without side effects.  UPDATE 04/23/13: Since last visit patient continues to have short-term memory problems. This is when she has conversations with her husband and then cannot remember the details later. She also has some difficulty remembering peoples names after  meeting them. Other people have not noticed any significant memory problems. Patient notices these problems primarily herself. Patient denies any significant depression. He does have some anxiety feelings with tense turning feeling in her stomach especially if her husband gets upset with her.  PRIOR HPI (10/07/12): 84 year old right-handed female here for evaluation of memory loss. Patient accompanied by her husband. For past one year patient is having increasing trouble with forgetting things. She's having to rely on taking notes, maintaining a calendar, using lists to remember things. Patient accompanied by her husband, who also has been diagnosed with mild cognitive impairment versus mild dementia by Dr. Erling Mathis. He takes galantamine, which he says has helped his memory. Other examples include forgetting recent conversations, recent events, repeating questions over and over. Unfortunately the only informant of this problem is the patient's husband, who also happens to have mild cognitive impairment or dementia. No other family members or friends have noticed this problem in the patient. Otherwise patient is doing well still able to maintain activities of daily living including cooking, cleaning, driving, shopping.   REVIEW OF SYSTEMS: Full 14 system review of systems performed and notable only for those listed, all others are neg:  Constitutional: neg  Cardiovascular: neg Ear/Nose/Throat: Hearing loss Skin: neg Eyes: neg Respiratory: neg Gastroitestinal: neg  Hematology/Lymphatic: neg  Endocrine: neg Musculoskeletal:neg Allergy/Immunology:  Allergies seasonal Neurological: Memory loss Psychiatric: neg Sleep : neg   ALLERGIES: Allergies  Allergen Reactions  . Epinephrine Palpitations    REACTION: heart racing    HOME MEDICATIONS: Outpatient Medications Prior to Visit  Medication Sig Dispense Refill  . atorvastatin (LIPITOR) 20 MG tablet Take 1  tablet (20 mg total) by mouth daily. 90  tablet 2  . Calcium Carbonate-Vitamin D (CALCIUM 600+D) 600-400 MG-UNIT per tablet Take 1 tablet by mouth daily.    . Cyanocobalamin (VITAMIN B 12 PO) Take 1 tablet by mouth daily. Reported on 12/27/2015    . donepezil (ARICEPT) 10 MG tablet TAKE ONE TABLET BY MOUTH EVERY NIGHT AT BEDTIME 90 tablet 3  . fish oil-omega-3 fatty acids 1000 MG capsule Take 1 g by mouth daily.    [name] glucosamine-chondroitin 500-400 MG tablet Take 1 tablet by mouth 3 (three) times daily.    . hydrochlorothiazide (HYDRODIURIL) 25 MG tablet Take 1 tablet (25 mg total) by mouth daily. 90 tablet 3  . losartan (COZAAR) 100 MG tablet Take 1 tablet (100 mg total) by mouth daily. 90 tablet 3  . memantine (NAMENDA) 10 MG tablet TAKE ONE TABLET BY MOUTH TWICE A DAY 180 tablet 3  . Multiple Vitamins-Minerals (MULTIVITAMIN & MINERAL PO) Take 1 tablet by mouth daily.    . verapamil (CALAN-SR) 240 MG CR tablet Take 1 tablet (240 mg total) by mouth at bedtime. 90 tablet 3  . warfarin (COUMADIN) 5 MG tablet TAKE AS DIRECTED BY COUMADIN CLINIC 30 tablet 3  . dicyclomine (BENTYL) 10 MG capsule Take 1 capsule (10 mg total) by mouth 4 (four) times daily -  before meals and at bedtime. (Patient not taking: Reported on 03/28/2018) 90 capsule 5   No facility-administered medications prior to visit.     PAST MEDICAL HISTORY: Past Medical History:  Diagnosis Date  . Anxiety   . Borderline diabetes mellitus   . Diverticulosis of colon   . DJD (degenerative joint disease)   . Dyslipidemia   . History of hematuria   . History of osteoporosis   . Hx of colonic polyps   . Hyperlipidemia   . Hypertension   . Hypothyroid   . Lipoma   . Macular degeneration   . Memory loss     PAST SURGICAL HISTORY: Past Surgical History:  Procedure Laterality Date  . APPENDECTOMY  1947  . COLONOSCOPY    . MASS EXCISION  04/17/2012   Procedure: EXCISION MASS;  Surgeon: [name], MD;  Location: [facility];  Service: General;   Laterality: N/A;  . THYROIDECTOMY  1990   [name]    FAMILY HISTORY: Family History  Problem Relation Age of Onset  . Stroke Mother   . Colon cancer Neg Hx   . Stomach cancer Neg Hx     SOCIAL HISTORY: Social History   Socioeconomic History  . Marital status: Married    Spouse name: Mariane Masters Clarida  . Number of children: 3  . Years of education: [employer]  . Highest education level: Not on file  Occupational History  . Occupation: Education officer, environmental: RETIRED  Social Needs  . Financial resource strain: Not on file  . Food insecurity:    Worry: Not on file    Inability: Not on file  . Transportation needs:    Medical: Not on file    Non-medical: Not on file  Tobacco Use  . Smoking status: Former Smoker    Last attempt to quit: 07/10/1965    Years since quitting: 52.7  . Smokeless tobacco: Never Used  . Tobacco comment: 3 cigs per week x 3 years (started in 1964), 12/27/15 does not smoke  Substance and Sexual Activity  . Alcohol use: Yes    Alcohol/week: 0.0 standard drinks  Comment: occasionally  . Drug use: No  . Sexual activity: Not on file  Lifestyle  . Physical activity:    Days per week: Not on file    Minutes per session: Not on file  . Stress: Not on file  Relationships  . Social connections:    Talks on phone: Not on file    Gets together: Not on file    Attends religious service: Not on file    Active member of club or organization: Not on file    Attends meetings of clubs or organizations: Not on file    Relationship status: Not on file  . Intimate partner violence:    Fear of current or ex partner: Not on file    Emotionally abused: Not on file    Physically abused: Not on file    Forced sexual activity: Not on file  Other Topics Concern  . Not on file  Social History Narrative   Pt lives at home with her spouse Mariane Masters Mizrahi, who has been dx'd with mild cognitive impairment vs. mild dementia, former pt of Dr. Erling Mathis). She does not  use caffeine, if so, rarely.     PHYSICAL EXAM  Vitals:   03/28/18 1522  BP: 135/79  Pulse: 65  Weight: 143 lb 3.2 oz (65 kg)  Height: 5\' 3"  (1.6 m)   Body mass index is 25.37 kg/m.  Generalized: Well developed, in no acute distress  Head: normocephalic and atraumatic,. Oropharynx benign  Neck: Supple, no carotid bruits  Cardiac: Regular rate rhythm, no murmur  Musculoskeletal: No deformity   Neurological examination   Mentation: Alert oriented to time, place, history taking. Attention span and concentration appropriate.  Follows all commands speech and language fluent. MMSE 226/30 Last 24/30. AFT 17. Clock drawing 4/4  Cranial nerve II-XII: .Pupils were equal round reactive to light extraocular movements were full, visual field were full on confrontational test. Facial sensation and strength were normal. hearing was intact to finger rubbing bilaterally. Uvula tongue midline. head turning and shoulder shrug were normal and symmetric.Tongue protrusion into cheek strength was normal. Motor: normal bulk and tone, full strength in the BUE, BLE,  Sensory: normal and symmetric to light touch,  Coordination: finger-nose-finger, heel-to-shin bilaterally, no dysmetria Reflexes: Symmetric upper and lower , plantar responses were flexor bilaterally. Gait and Station: Rising up from seated position without assistance, normal stance,  moderate stride, good arm swing, smooth turning, able to perform tiptoe, and heel walking without difficulty. Tandem gait is steady.  No assistive device  DIAGNOSTIC DATA (LABS, IMAGING, TESTING) - I reviewed patient records, labs, notes, testing and imaging myself where available.  Lab Results  Component Value Date   WBC 10.0 01/07/2018   HGB 15.1 01/07/2018   HCT 42.7 01/07/2018   MCV 90 01/07/2018   PLT 258 01/07/2018      Component Value Date/Time   NA 140 02/13/2018 1236   NA 140 01/07/2018 0841   K 4.4 02/13/2018 1236   CL 100 02/13/2018 1236     CO2 35 (H) 02/13/2018 1236   GLUCOSE 198 (H) 02/13/2018 1236   BUN 22 02/13/2018 1236   BUN 19 01/07/2018 0841   CREATININE 0.95 02/13/2018 1236   CALCIUM 9.7 02/13/2018 1236   PROT 6.9 01/07/2018 0841   ALBUMIN 4.1 01/07/2018 0841   AST 21 01/07/2018 0841   ALT 21 01/07/2018 0841   ALKPHOS 73 01/07/2018 0841   BILITOT 0.5 01/07/2018 0841   GFRNONAA 61 01/07/2018 0841  GFRAA 70 01/07/2018 0841   Lab Results  Component Value Date   CHOL 187 01/07/2018   HDL 44 01/07/2018   LDLCALC 92 01/07/2018   TRIG 255 (H) 01/07/2018   CHOLHDL 4.3 01/07/2018   Lab Results  Component Value Date   HGBA1C 6.6 (H) 02/13/2018   Lab Results  Component Value Date   VITAMINB12 819 10/07/2012   Lab Results  Component Value Date   TSH 0.41 02/13/2018      ASSESSMENT AND PLAN 84 y.o. year old female  has a past medical history of Macular degeneration; Hypertension; Dyslipidemia; Borderline diabetes mellitus; Hypothyroid; Diverticulosis of colon;  DJD (degenerative joint disease); History of osteoporosis; Memory loss; Anxiety; Lipoma; and Hyperlipidemia. here with history of short-term memory problems since March 2012.  Diagnosis mild cognitive impairment versus mild dementia.  Neurologic examination today is unremarkable. MMSE 26/30.       PLAN; Continue Aricept and Namenda at current doses  Does not need refills  Memory score is stable  Continue walking and exercise routine   No safety issues identified Follow-up yearly and when necessary Dennie Bible, [facility], [facility], APRN  [facility] [address] [place], [place] [address] [phone]

## 2018-03-28 ENCOUNTER — Ambulatory Visit: Payer: Medicare Other | Admitting: Nurse Practitioner

## 2018-03-28 ENCOUNTER — Encounter: Payer: Self-pay | Admitting: Nurse Practitioner

## 2018-03-28 DIAGNOSIS — G3184 Mild cognitive impairment, so stated: Secondary | ICD-10-CM | POA: Diagnosis not present

## 2018-03-28 NOTE — Patient Instructions (Signed)
Continue Aricept and Namenda at current doses  Does not need refills  Memory score is stable  Continue walking and exercise routine   No safety issues identified Follow-up yearly and when necessary

## 2018-04-01 ENCOUNTER — Other Ambulatory Visit: Payer: Self-pay | Admitting: Cardiology

## 2018-04-01 DIAGNOSIS — I480 Paroxysmal atrial fibrillation: Secondary | ICD-10-CM

## 2018-04-02 ENCOUNTER — Ambulatory Visit: Payer: Medicare Other

## 2018-04-02 DIAGNOSIS — Z5181 Encounter for therapeutic drug level monitoring: Secondary | ICD-10-CM

## 2018-04-02 DIAGNOSIS — I480 Paroxysmal atrial fibrillation: Secondary | ICD-10-CM

## 2018-04-02 LAB — POCT INR: INR: 3 (ref 2.0–3.0)

## 2018-04-02 NOTE — Patient Instructions (Signed)
Please have a serving of greens today, then continue taking  1/2 tablet everyday except 1 tablet on Sundays,  Wednesdays and Fridays.   Recheck INR in 4 weeks. Call with any medication changes or if scheduled for any procedures [phone].

## 2018-04-05 DIAGNOSIS — M79671 Pain in right foot: Secondary | ICD-10-CM | POA: Diagnosis not present

## 2018-04-05 DIAGNOSIS — M25561 Pain in right knee: Secondary | ICD-10-CM | POA: Diagnosis not present

## 2018-04-05 DIAGNOSIS — S92354A Nondisplaced fracture of fifth metatarsal bone, right foot, initial encounter for closed fracture: Secondary | ICD-10-CM | POA: Diagnosis not present

## 2018-04-05 DIAGNOSIS — M1711 Unilateral primary osteoarthritis, right knee: Secondary | ICD-10-CM | POA: Diagnosis not present

## 2018-04-30 ENCOUNTER — Ambulatory Visit: Payer: Medicare Other

## 2018-04-30 DIAGNOSIS — Z5181 Encounter for therapeutic drug level monitoring: Secondary | ICD-10-CM | POA: Diagnosis not present

## 2018-04-30 DIAGNOSIS — I480 Paroxysmal atrial fibrillation: Secondary | ICD-10-CM

## 2018-04-30 LAB — POCT INR: INR: 2.7 (ref 2.0–3.0)

## 2018-04-30 NOTE — Patient Instructions (Signed)
Please continue taking  1/2 tablet everyday except 1 tablet on Sundays,  Wednesdays and Fridays.   Recheck INR in 5 weeks. Call with any medication changes or if scheduled for any procedures [phone].

## 2018-05-06 DIAGNOSIS — S92354G Nondisplaced fracture of fifth metatarsal bone, right foot, subsequent encounter for fracture with delayed healing: Secondary | ICD-10-CM | POA: Diagnosis not present

## 2018-05-06 DIAGNOSIS — S92354A Nondisplaced fracture of fifth metatarsal bone, right foot, initial encounter for closed fracture: Secondary | ICD-10-CM | POA: Diagnosis not present

## 2018-05-06 DIAGNOSIS — M79671 Pain in right foot: Secondary | ICD-10-CM | POA: Diagnosis not present

## 2018-05-08 ENCOUNTER — Other Ambulatory Visit: Payer: Self-pay

## 2018-05-08 NOTE — Patient Outreach (Signed)
[facility]) Care Management  05/08/2018  Cynthia Mathis 25-May-1933 [id number]   Medication Adherence call to Cynthia Mathis spoke with patient she is still taking Atorvastatin 20 mg and Losartan 100 mg and has plenty for about other month she does not need any at this time she takes it on a regular basis . Cynthia Mathis is showing past due under [facility].   [facility] Management Direct Dial [phone]  Fax [phone] [email or web address]

## 2018-05-09 ENCOUNTER — Other Ambulatory Visit: Payer: Self-pay | Admitting: *Deleted

## 2018-05-09 DIAGNOSIS — I480 Paroxysmal atrial fibrillation: Secondary | ICD-10-CM

## 2018-05-13 DIAGNOSIS — H353221 Exudative age-related macular degeneration, left eye, with active choroidal neovascularization: Secondary | ICD-10-CM | POA: Diagnosis not present

## 2018-05-13 DIAGNOSIS — H353133 Nonexudative age-related macular degeneration, bilateral, advanced atrophic without subfoveal involvement: Secondary | ICD-10-CM | POA: Diagnosis not present

## 2018-05-13 DIAGNOSIS — H353211 Exudative age-related macular degeneration, right eye, with active choroidal neovascularization: Secondary | ICD-10-CM | POA: Diagnosis not present

## 2018-05-13 DIAGNOSIS — H3562 Retinal hemorrhage, left eye: Secondary | ICD-10-CM | POA: Diagnosis not present

## 2018-05-13 DIAGNOSIS — H353212 Exudative age-related macular degeneration, right eye, with inactive choroidal neovascularization: Secondary | ICD-10-CM | POA: Diagnosis not present

## 2018-05-16 DIAGNOSIS — H26493 Other secondary cataract, bilateral: Secondary | ICD-10-CM | POA: Diagnosis not present

## 2018-05-16 DIAGNOSIS — H353231 Exudative age-related macular degeneration, bilateral, with active choroidal neovascularization: Secondary | ICD-10-CM | POA: Diagnosis not present

## 2018-05-16 DIAGNOSIS — H40013 Open angle with borderline findings, low risk, bilateral: Secondary | ICD-10-CM | POA: Diagnosis not present

## 2018-05-16 DIAGNOSIS — H04123 Dry eye syndrome of bilateral lacrimal glands: Secondary | ICD-10-CM | POA: Diagnosis not present

## 2018-05-20 ENCOUNTER — Other Ambulatory Visit: Payer: Self-pay

## 2018-05-20 NOTE — Patient Outreach (Signed)
[facility]) Care Management  05/20/2018  Shelie Laroche October 12, 1932 [id number]   Medication Adherence call to Mrs. Elisabel Lenhardt left a message for patient to call back patient is due on Atorvastatin and Losartan 100 mg.Mrs. Berdine Addison is showing past due under [facility].   [facility] Management Direct Dial [phone]  Fax [phone] [email or web address]

## 2018-06-04 ENCOUNTER — Ambulatory Visit (INDEPENDENT_AMBULATORY_CARE_PROVIDER_SITE_OTHER): Payer: Medicare Other

## 2018-06-04 DIAGNOSIS — Z5181 Encounter for therapeutic drug level monitoring: Secondary | ICD-10-CM | POA: Diagnosis not present

## 2018-06-04 DIAGNOSIS — I480 Paroxysmal atrial fibrillation: Secondary | ICD-10-CM | POA: Diagnosis not present

## 2018-06-04 LAB — POCT INR: INR: 3.8 — AB (ref 2.0–3.0)

## 2018-06-04 NOTE — Patient Instructions (Signed)
Please skip coumadin today, try to have a large serving of greens, then continue taking  1/2 tablet everyday except 1 tablet on Sundays,  Wednesdays and Fridays.   Recheck INR in 4 weeks. Call with any medication changes or if scheduled for any procedures [phone]

## 2018-06-12 ENCOUNTER — Other Ambulatory Visit: Payer: Self-pay

## 2018-06-12 NOTE — Patient Outreach (Signed)
[facility]) Care Management  06/12/2018  Almer Mick 1933/05/08 [id number]   Medication Adherence call to Mrs. Dyanna Grulke  patient's husband ask to call back after 1pm patient is due on Atorvastatin 20 mg and Losartan 100 mg. Mrs. Addison is showing past due under [facility].   [facility] Management Direct Dial [phone]  Fax [phone] [email or web address]

## 2018-06-19 ENCOUNTER — Encounter: Payer: Self-pay | Admitting: Pulmonary Disease

## 2018-06-19 ENCOUNTER — Ambulatory Visit: Payer: Medicare Other | Admitting: Pulmonary Disease

## 2018-06-19 VITALS — BP 142/64 | HR 66 | Temp 97.9°F | Ht 63.0 in | Wt 147.0 lb

## 2018-06-19 DIAGNOSIS — R413 Other amnesia: Secondary | ICD-10-CM

## 2018-06-19 DIAGNOSIS — I10 Essential (primary) hypertension: Secondary | ICD-10-CM

## 2018-06-19 DIAGNOSIS — I480 Paroxysmal atrial fibrillation: Secondary | ICD-10-CM

## 2018-06-19 DIAGNOSIS — M150 Primary generalized (osteo)arthritis: Secondary | ICD-10-CM

## 2018-06-19 DIAGNOSIS — H3530 Unspecified macular degeneration: Secondary | ICD-10-CM

## 2018-06-19 DIAGNOSIS — M159 Polyosteoarthritis, unspecified: Secondary | ICD-10-CM

## 2018-06-19 DIAGNOSIS — E039 Hypothyroidism, unspecified: Secondary | ICD-10-CM

## 2018-06-19 DIAGNOSIS — R7301 Impaired fasting glucose: Secondary | ICD-10-CM

## 2018-06-19 DIAGNOSIS — M8580 Other specified disorders of bone density and structure, unspecified site: Secondary | ICD-10-CM

## 2018-06-19 DIAGNOSIS — E782 Mixed hyperlipidemia: Secondary | ICD-10-CM

## 2018-06-19 NOTE — Patient Instructions (Signed)
Today we updated your med list in our EPIC system...    Continue your current medications the same...  The reddish spot on your left leg is a small hematoma (collection of blood under the skin) and it should resolve over time...  I recommend a cool compress if you have any discomfort...  You may wrap the area w/ an ACE bandage if you want to...  Your PROTIME is closely monitored by the Coumadin clinic...  Call for any questions or if we can be of service in any way.Marland KitchenMarland Kitchen

## 2018-07-02 ENCOUNTER — Ambulatory Visit: Payer: Medicare Other

## 2018-07-02 DIAGNOSIS — I480 Paroxysmal atrial fibrillation: Secondary | ICD-10-CM | POA: Diagnosis not present

## 2018-07-02 DIAGNOSIS — Z5181 Encounter for therapeutic drug level monitoring: Secondary | ICD-10-CM | POA: Diagnosis not present

## 2018-07-02 LAB — POCT INR: INR: 4.2 — AB (ref 2.0–3.0)

## 2018-07-02 NOTE — Patient Instructions (Signed)
Description   Skip today's dosage of Coumadin, then start taking 1/2 tablet everyday except 1 tablet on Sundays and Wednesdays.   Recheck INR in 3 weeks. Call with any medication changes or if scheduled for any procedures [phone].

## 2018-07-04 DIAGNOSIS — R82998 Other abnormal findings in urine: Secondary | ICD-10-CM | POA: Diagnosis not present

## 2018-07-04 DIAGNOSIS — Z124 Encounter for screening for malignant neoplasm of cervix: Secondary | ICD-10-CM | POA: Diagnosis not present

## 2018-07-04 DIAGNOSIS — Z1389 Encounter for screening for other disorder: Secondary | ICD-10-CM | POA: Diagnosis not present

## 2018-07-05 DIAGNOSIS — R82998 Other abnormal findings in urine: Secondary | ICD-10-CM | POA: Diagnosis not present

## 2018-07-10 DIAGNOSIS — C801 Malignant (primary) neoplasm, unspecified: Secondary | ICD-10-CM

## 2018-07-10 HISTORY — DX: Malignant (primary) neoplasm, unspecified: C80.1

## 2018-07-11 ENCOUNTER — Other Ambulatory Visit: Payer: Self-pay | Admitting: Diagnostic Neuroimaging

## 2018-07-23 ENCOUNTER — Ambulatory Visit: Payer: Medicare Other

## 2018-07-23 DIAGNOSIS — Z5181 Encounter for therapeutic drug level monitoring: Secondary | ICD-10-CM | POA: Diagnosis not present

## 2018-07-23 DIAGNOSIS — I480 Paroxysmal atrial fibrillation: Secondary | ICD-10-CM | POA: Diagnosis not present

## 2018-07-23 LAB — POCT INR: INR: 4.9 — AB (ref 2.0–3.0)

## 2018-07-23 NOTE — Patient Instructions (Signed)
Please skip coumadin tonight & tomorrow, then START NEW DOSAGE of 1/2 tablet everyday.   Recheck INR in 2 weeks. Call with any medication changes or if scheduled for any procedures [phone]

## 2018-08-06 ENCOUNTER — Ambulatory Visit: Payer: Medicare Other

## 2018-08-06 DIAGNOSIS — Z5181 Encounter for therapeutic drug level monitoring: Secondary | ICD-10-CM | POA: Diagnosis not present

## 2018-08-06 DIAGNOSIS — I480 Paroxysmal atrial fibrillation: Secondary | ICD-10-CM | POA: Diagnosis not present

## 2018-08-06 LAB — POCT INR: INR: 2.2 (ref 2.0–3.0)

## 2018-08-06 NOTE — Patient Instructions (Signed)
Please continue dosage of 1/2 tablet everyday.   Recheck INR in 3 weeks. Call with any medication changes or if scheduled for any procedures [phone].

## 2018-08-14 DIAGNOSIS — H353211 Exudative age-related macular degeneration, right eye, with active choroidal neovascularization: Secondary | ICD-10-CM | POA: Diagnosis not present

## 2018-08-14 DIAGNOSIS — H3562 Retinal hemorrhage, left eye: Secondary | ICD-10-CM | POA: Diagnosis not present

## 2018-08-14 DIAGNOSIS — H353212 Exudative age-related macular degeneration, right eye, with inactive choroidal neovascularization: Secondary | ICD-10-CM | POA: Diagnosis not present

## 2018-08-14 DIAGNOSIS — H353221 Exudative age-related macular degeneration, left eye, with active choroidal neovascularization: Secondary | ICD-10-CM | POA: Diagnosis not present

## 2018-08-16 DIAGNOSIS — M1711 Unilateral primary osteoarthritis, right knee: Secondary | ICD-10-CM | POA: Diagnosis not present

## 2018-08-16 DIAGNOSIS — M25561 Pain in right knee: Secondary | ICD-10-CM | POA: Diagnosis not present

## 2018-08-27 ENCOUNTER — Ambulatory Visit: Payer: Medicare Other | Admitting: Pharmacist

## 2018-08-27 DIAGNOSIS — Z5181 Encounter for therapeutic drug level monitoring: Secondary | ICD-10-CM | POA: Diagnosis not present

## 2018-08-27 DIAGNOSIS — I480 Paroxysmal atrial fibrillation: Secondary | ICD-10-CM | POA: Diagnosis not present

## 2018-08-27 LAB — POCT INR: INR: 2 (ref 2.0–3.0)

## 2018-08-27 NOTE — Patient Instructions (Signed)
Description   Take 1 tablet today since you plan on eating greens, then continue dosage of 1/2 tablet everyday.   Recheck INR in 4 weeks. Call with any medication changes or if scheduled for any procedures [phone].

## 2018-08-28 ENCOUNTER — Encounter: Payer: Self-pay | Admitting: Family Medicine

## 2018-08-28 ENCOUNTER — Ambulatory Visit (INDEPENDENT_AMBULATORY_CARE_PROVIDER_SITE_OTHER): Payer: Medicare Other | Admitting: Family Medicine

## 2018-08-28 VITALS — BP 118/68 | HR 60 | Temp 98.8°F | Ht 63.0 in | Wt 144.2 lb

## 2018-08-28 DIAGNOSIS — M150 Primary generalized (osteo)arthritis: Secondary | ICD-10-CM

## 2018-08-28 DIAGNOSIS — R7301 Impaired fasting glucose: Secondary | ICD-10-CM

## 2018-08-28 DIAGNOSIS — R413 Other amnesia: Secondary | ICD-10-CM

## 2018-08-28 DIAGNOSIS — E785 Hyperlipidemia, unspecified: Secondary | ICD-10-CM

## 2018-08-28 DIAGNOSIS — I480 Paroxysmal atrial fibrillation: Secondary | ICD-10-CM

## 2018-08-28 DIAGNOSIS — M8580 Other specified disorders of bone density and structure, unspecified site: Secondary | ICD-10-CM

## 2018-08-28 DIAGNOSIS — M159 Polyosteoarthritis, unspecified: Secondary | ICD-10-CM

## 2018-08-28 DIAGNOSIS — I10 Essential (primary) hypertension: Secondary | ICD-10-CM

## 2018-08-28 DIAGNOSIS — E039 Hypothyroidism, unspecified: Secondary | ICD-10-CM

## 2018-08-28 NOTE — Progress Notes (Signed)
   Subjective:    Patient ID: Cynthia Mathis, female    DOB: February 15, 1933, 85 y.o.   MRN: [id number]  HPI Here to establish with me for primary care after the retirement of Dr. Teressa Lower. I was the primary care physician for her husband for many years until he passed away a few months ago. She is here with her daughter who is visiting from [place]. She has been doing well in general except for severe pain in the right knee. She has advanced osteoarthritis and she has been seeing Dr. Paralee Cancel for this. She has had several cortisone shots in the knee with poor results. He has recommended she have a total knee replacement surgery, and she is considering this. Currently she takes nothing for pain because she is concerned that this may interfere with her Coumadin. She has paroxysmal atrial fibrillation and she is on long term anticoagulation with Coumadin. She is in sinus rhythm now and her most recent EKG last month showed NSR. She has not felt any fluttering or racing in her chest for over a year. No chest pain or SOB. She had been very active, even though she deals with arthritis pains, and she enjoyed playing pickleball almost every day until recently. Lately she has been unable to play due to the pain. She wears a brace given to her by Dr. Alvan Dame. She has HTN and her BP has been stable. She has elevated glucoses, and her last A1c was 6.6 last August. She takes Aricept and Namenda for mild memory impairment, and she and her daughter both feel these help her. She lives by herself and is independent. She still drives a car. She has a hx of osteoporosis and she took bisphosphonates for a few years. Her bone density did improve and her last DEXA in 2015 showed osteopenia. She has macular degeneration and she has had several retinal injections per Dr. [name].   Review of Systems  Eyes: Positive for visual disturbance.  Cardiovascular: Negative.   Gastrointestinal: Negative.   Genitourinary: Negative.    Musculoskeletal: Positive for arthralgias.  Neurological: Negative.        Objective:   Physical Exam Constitutional:      Appearance: Normal appearance.  Cardiovascular:     Rate and Rhythm: Normal rate and regular rhythm.     Pulses: Normal pulses.     Heart sounds: Normal heart sounds.  Pulmonary:     Effort: Pulmonary effort is normal.     Breath sounds: Normal breath sounds.  Abdominal:     General: Abdomen is flat. Bowel sounds are normal. There is no distension.     Palpations: Abdomen is soft. There is no mass.     Tenderness: There is no abdominal tenderness. There is no guarding or rebound.     Hernia: No hernia is present.  Neurological:     General: No focal deficit present.     Mental Status: She is alert and oriented to person, place, and time.     Coordination: Coordination normal.           Assessment & Plan:  Intro visit for this patient who is doing well in general but whom suffers from right knee pain. I advised her she can take up to 2000 mg of Tylenol every day to help with pain. She declines anything stronger at this point. She will return soon for a more detailed exam and fasting labs.  [name], MD

## 2018-09-12 DIAGNOSIS — M1711 Unilateral primary osteoarthritis, right knee: Secondary | ICD-10-CM | POA: Diagnosis not present

## 2018-09-17 DIAGNOSIS — M1711 Unilateral primary osteoarthritis, right knee: Secondary | ICD-10-CM | POA: Diagnosis not present

## 2018-09-19 ENCOUNTER — Ambulatory Visit: Payer: Self-pay | Admitting: *Deleted

## 2018-09-19 NOTE — Telephone Encounter (Signed)
Pt reports non-productive cough x 3 days. Reports "runny nose." Denies sinus tenderness, no SOB, denies chest tightness. Does report "Mild wheezing at times." States afebrile. Reports is taking Mucinex, helps "A little to loosen things up." Appt made for tomorrow AM with Dr. Sarajane Jews. Care advise given per protocol. Pt verbalizes understanding.  Reason for Disposition . [1] Continuous (nonstop) coughing interferes with work or school AND [2] no improvement using cough treatment per protocol  Answer Assessment - Initial Assessment Questions 1. ONSET: "When did the cough begin?"      3rd day 2. SEVERITY: "How bad is the cough today?"      Moderate-severe 3. RESPIRATORY DISTRESS: "Describe your breathing."     WNL 4. FEVER: "Do you have a fever?" If so, ask: "What is your temperature, how was it measured, and when did it start?"     no 5. HEMOPTYSIS: "Are you coughing up any blood?" If so ask: "How much?" (flecks, streaks, tablespoons, etc.)     no 6. TREATMENT: "What have you done so far to treat the cough?" (e.g., meds, fluids, humidifier)     Mucinex 7. CARDIAC HISTORY: "Do you have any history of heart disease?" (e.g., heart attack, congestive heart failure)      no 8. LUNG HISTORY: "Do you have any history of lung disease?"  (e.g., pulmonary embolus, asthma, emphysema)    no 9. PE RISK FACTORS: "Do you have a history of blood clots?" (or: recent major surgery, recent prolonged travel, bedridden)     no 10. OTHER SYMPTOMS: "Do you have any other symptoms? (e.g., runny nose, wheezing, chest pain)      Mild wheezing at times, runny nose  12. TRAVEL: "Have you traveled out of the country in the last month?" (e.g., travel history, exposures)       no  Protocols used: COUGH - ACUTE NON-PRODUCTIVE-A-AH

## 2018-09-20 ENCOUNTER — Other Ambulatory Visit: Payer: Self-pay

## 2018-09-20 ENCOUNTER — Ambulatory Visit (INDEPENDENT_AMBULATORY_CARE_PROVIDER_SITE_OTHER): Payer: Medicare Other | Admitting: Family Medicine

## 2018-09-20 ENCOUNTER — Ambulatory Visit: Payer: Medicare Other | Admitting: Family Medicine

## 2018-09-20 ENCOUNTER — Encounter: Payer: Self-pay | Admitting: Family Medicine

## 2018-09-20 VITALS — BP 140/78 | HR 76 | Temp 99.0°F | Wt 140.5 lb

## 2018-09-20 DIAGNOSIS — J209 Acute bronchitis, unspecified: Secondary | ICD-10-CM | POA: Diagnosis not present

## 2018-09-20 MED ORDER — AZITHROMYCIN 250 MG PO TABS: ORAL_TABLET | ORAL | 0 refills | Status: DC

## 2018-09-20 NOTE — Progress Notes (Signed)
   Subjective:    Patient ID: Cynthia Mathis, female    DOB: 1932-10-08, 85 y.o.   MRN: [id number]  HPI Here for 3 days of chest congestion and a dry cough. No fever. Using Mucinex.    Review of Systems  Constitutional: Negative.   HENT: Positive for congestion and postnasal drip. Negative for sinus pressure, sinus pain and sore throat.   Eyes: Negative.   Respiratory: Positive for cough and chest tightness. Negative for shortness of breath and wheezing.        Objective:   Physical Exam Constitutional:      Appearance: Normal appearance. She is not ill-appearing.  HENT:     Right Ear: Tympanic membrane and ear canal normal.     Left Ear: Tympanic membrane and ear canal normal.     Nose: Nose normal.     Mouth/Throat:     Pharynx: Oropharynx is clear.  Eyes:     Conjunctiva/sclera: Conjunctivae normal.  Pulmonary:     Effort: Pulmonary effort is normal. No respiratory distress.     Breath sounds: Rhonchi present. No wheezing or rales.  Lymphadenopathy:     Cervical: No cervical adenopathy.  Neurological:     Mental Status: She is alert.           Assessment & Plan:  Bronchitis, treat with a Zpack.  [name], MD

## 2018-09-24 ENCOUNTER — Ambulatory Visit: Payer: Medicare Other | Admitting: Pharmacist

## 2018-09-24 ENCOUNTER — Other Ambulatory Visit: Payer: Self-pay

## 2018-09-24 DIAGNOSIS — I480 Paroxysmal atrial fibrillation: Secondary | ICD-10-CM

## 2018-09-24 DIAGNOSIS — Z5181 Encounter for therapeutic drug level monitoring: Secondary | ICD-10-CM

## 2018-09-24 LAB — POCT INR: INR: 1.9 — AB (ref 2.0–3.0)

## 2018-09-24 NOTE — Patient Instructions (Signed)
Description   Take an extra 1/2 tablet when you get home, then continue dosage of 1/2 tablet every day.   Recheck INR in 5 weeks. Call with any medication changes or if scheduled for any procedures [phone].

## 2018-10-14 ENCOUNTER — Encounter: Payer: Self-pay | Admitting: Family Medicine

## 2018-10-14 ENCOUNTER — Other Ambulatory Visit: Payer: Self-pay

## 2018-10-14 ENCOUNTER — Ambulatory Visit (INDEPENDENT_AMBULATORY_CARE_PROVIDER_SITE_OTHER): Payer: Medicare Other | Admitting: Family Medicine

## 2018-10-14 DIAGNOSIS — I10 Essential (primary) hypertension: Secondary | ICD-10-CM | POA: Diagnosis not present

## 2018-10-14 DIAGNOSIS — R42 Dizziness and giddiness: Secondary | ICD-10-CM | POA: Diagnosis not present

## 2018-10-14 MED ORDER — MECLIZINE HCL 25 MG PO TABS: 25.0000 mg | ORAL_TABLET | ORAL | 1 refills | Status: DC | PRN

## 2018-10-14 NOTE — Progress Notes (Signed)
Subjective:    Patient ID: Cynthia Mathis, female    DOB: [birth year], 85 y.o.   MRN: [id number]  HPI Virtual Visit via Video Note  I connected with the patient on 10/14/18 at  9:45 AM EDT by a video enabled telemedicine application and verified that I am speaking with the correct person using two identifiers.  Location patient: home Location provider:work or home office Persons participating in the virtual visit: patient, provider  I discussed the limitations of evaluation and management by telemedicine and the availability of in person appointments. The patient expressed understanding and agreed to proceed.   HPI: I spoke to the patient, who is lying in bed, and her son. She had been doing well until 2 days ago when she woke up about 5 am as usual and got out of bed to go to the bathroom. She immediately felt very weak and dizzy, and the room started spinning around her. She had difficulty walking so she crawled to her bathroom and when she got back in bed, she called her son. He has been with her most of the past 2 days, and she says the dizziness is worst in the early mornings and then it wears off during the day. Her BP at past 2 mornings has started off at 143/64, then an hour later is 160/69, and by about 9 am it is up to 180/80. As we speak it is about 159/74. By the afternoons the BP is back to her baseline. She denies any headache or fever or cough or SOB or chest pain.    ROS: See pertinent positives and negatives per HPI.  Past Medical History:  Diagnosis Date  . Anxiety   . Borderline diabetes mellitus   . Diverticulosis of colon   . DJD (degenerative joint disease)   . Dyslipidemia   . History of hematuria   . History of osteoporosis   . Hx of colonic polyps   . Hypertension   . Hypothyroid   . Macular degeneration    sees [name] for general eye care, sees [name] for retinal injections   . Memory loss   . Osteoporosis    took bisphosphonates, now off.  Last DEXA in 2015 showed osteopenia   . Paroxysmal atrial fibrillation (HCC)    sees [name]     Past Surgical History:  Procedure Laterality Date  . APPENDECTOMY  1947  . COLONOSCOPY  12/02/2014   per [name], severe diverticulosis but no polyps   . MASS EXCISION  04/17/2012   Procedure: EXCISION MASS;  Surgeon: [name], MD;  Location: [facility];  Service: General;  Laterality: N/A;  . THYROIDECTOMY  1990   [name]    Family History  Problem Relation Age of Onset  . Stroke Mother   . Colon cancer Neg Hx   . Stomach cancer Neg Hx      Current Outpatient Medications:  .  atorvastatin (LIPITOR) 20 MG tablet, Take 1 tablet (20 mg total) by mouth daily., Disp: 90 tablet, Rfl: 2 .  azithromycin (ZITHROMAX Z-PAK) 250 MG tablet, As directed, Disp: 6 each, Rfl: 0 .  Calcium Carbonate-Vitamin D (CALCIUM 600+D) 600-400 MG-UNIT per tablet, Take 1 tablet by mouth daily., Disp: , Rfl:  .  Cyanocobalamin (VITAMIN B 12 PO), Take 1 tablet by mouth daily. Reported on 12/27/2015, Disp: , Rfl:  .  donepezil (ARICEPT) 10 MG tablet, TAKE ONE TABLET BY MOUTH EVERY NIGHT AT BEDTIME, Disp:  90 tablet, Rfl: 3 .  fish oil-omega-3 fatty acids 1000 MG capsule, Take 1 g by mouth daily., Disp: , Rfl:  .  glucosamine-chondroitin 500-400 MG tablet, Take 1 tablet by mouth 3 (three) times daily., Disp: , Rfl:  .  hydrochlorothiazide (HYDRODIURIL) 25 MG tablet, Take 1 tablet (25 mg total) by mouth daily., Disp: 90 tablet, Rfl: 3 .  losartan (COZAAR) 100 MG tablet, Take 1 tablet (100 mg total) by mouth daily., Disp: 90 tablet, Rfl: 3 .  memantine (NAMENDA) 10 MG tablet, TAKE ONE TABLET BY MOUTH TWICE A DAY, Disp: 180 tablet, Rfl: 2 .  Multiple Vitamins-Minerals (MULTIVITAMIN & MINERAL PO), Take 1 tablet by mouth daily., Disp: , Rfl:  .  verapamil (CALAN-SR) 240 MG CR tablet, Take 1 tablet (240 mg total) by mouth at bedtime., Disp: 90 tablet, Rfl: 3 .  warfarin (COUMADIN) 5  MG tablet, TAKE BY MOUTH AS DIRECTED BY COUMADIN CLINIC., Disp: 90 tablet, Rfl: 1  EXAM:  VITALS per patient if applicable:  GENERAL: alert, oriented, appears well and in no acute distress  HEENT: atraumatic, conjunttiva clear, no obvious abnormalities on inspection of external nose and ears  NECK: normal movements of the head and neck  LUNGS: on inspection no signs of respiratory distress, breathing rate appears normal, no obvious gross SOB, gasping or wheezing  CV: no obvious cyanosis  MS: moves all visible extremities without noticeable abnormality  PSYCH/NEURO: pleasant and cooperative, no obvious depression or anxiety, speech and thought processing grossly intact  ASSESSMENT AND PLAN: She seems to be having bouts of vertigo, and I think her BP has been going up in response to that. We will leave all her cardiac medications the same. I suggested she drink lots of fluids and take a Claritin every day. Try Meclizine as needed. She will follow up prn.  [name], MD  Discussed the following assessment and plan:  No diagnosis found.     I discussed the assessment and treatment plan with the patient. The patient was provided an opportunity to ask questions and all were answered. The patient agreed with the plan and demonstrated an understanding of the instructions.   The patient was advised to call back or seek an in-person evaluation if the symptoms worsen or if the condition fails to improve as anticipated.     Review of Systems     Objective:   Physical Exam        Assessment & Plan:

## 2018-10-16 ENCOUNTER — Telehealth: Payer: Self-pay | Admitting: Family Medicine

## 2018-10-16 ENCOUNTER — Ambulatory Visit: Payer: Self-pay | Admitting: *Deleted

## 2018-10-16 NOTE — Telephone Encounter (Signed)
Noted  

## 2018-10-16 NOTE — Telephone Encounter (Signed)
Pt and her son [name] called because she was diagnosed with vertigo on 10/14/2018; she was given meclizine and told to take claritin also; [name] says that her last dose of meclizine was at 1800 on 10/15/2018; this morning she woke up feeling dizzy and sluggish this morning at 0815, and tool a dose of meclizine at 0900; the pt appears to be ok, but when she moves her head quickly she becomes dizzy; nurse triage initiated and recommendations made per protocol; they would also like instructions for taking meclizine, and should she take it along with the claritin; explained that she can take the meclizine every 4 hours as needed, and the claritin daily until they receive recommendations per Dr Sharlene Motts; he verbalized understanding, but states that he wonders if it is something with her ear; they would like a visit with Dr Sarajane Jews; explained that this information will be sent to Dr Sarajane Jews, and he will make the final determination of what type visit is needed (virtual vs office); they verbalize understanding and can be contacted at [email or web address] and   [phone]; a message can be left on the voicemail; also notified La[place], and she explains the same; will route to office for final disposition.         Reason for Disposition . [1] MODERATE dizziness (e.g., vertigo; feels very unsteady, interferes with normal activities) AND [2] has been evaluated by physician for this  Answer Assessment - Initial Assessment Questions 1. DESCRIPTION: "Describe your dizziness."     vertigo 2. VERTIGO: "Do you feel like either you or the room is spinning or tilting?"      yes 3. LIGHTHEADED: "Do you feel lightheaded?" (e.g., somewhat faint, woozy, weak upon standing)      4. SEVERITY: "How bad is it?"  "Can you walk?"   - MILD - Feels unsteady but walking normally.   - MODERATE - Feels very unsteady when walking, but not falling; interferes with normal activities (e.g., school, work) .   - SEVERE - Unable to walk without  falling (requires assistance).     moderate 5. ONSET:  "When did the dizziness begin?"     Seen in office 10/14/2018 6. AGGRAVATING FACTORS: "Does anything make it worse?" (e.g., standing, change in head position)     Seen in office 10/14/2018 7. CAUSE: "What do you think is causing the dizziness?"    vertigo 8. RECURRENT SYMPTOM: "Have you had dizziness before?" If so, ask: "When was the last time?" "What happened that time?"     Seen in office 10/14/2018 9. OTHER SYMPTOMS: "Do you have any other symptoms?" (e.g., headache, weakness, numbness, vomiting, earache)    Weakness, sluggish 10. PREGNANCY: "Is there any chance you are pregnant?" "When was your last menstrual period?"       no  Protocols used: DIZZINESS - VERTIGO-A-AH

## 2018-10-16 NOTE — Telephone Encounter (Signed)
Call in Valium 2 mg to take every 8 hours prn dizziness, #60 with no rf. If these do not help, I suggest she talk to Dr. Leta Baptist, her neurologist about it

## 2018-10-16 NOTE — Telephone Encounter (Signed)
Dr. Fry please advise. Thanks  

## 2018-10-16 NOTE — Telephone Encounter (Signed)
Pts son called in stating that pt is still experiencing some dizziness and would like to know if she should continue to take the meclizine or what should they do.  Pts son would like to have a call back.

## 2018-10-16 NOTE — Telephone Encounter (Signed)
Called and spoke with pts son and he stated that the meclizine started to take the dizziness away so the pt stopped taking the meds and the dizziness came back.  After speaking with the pts son, they will stay on the meclizine on a regular basis and they will call in a week if they feel this is not helping.  He is aware that this type of med will make her drowsy, so make sure she is safe when getting up moving around.  Will forward to Dr. Sarajane Jews to make him aware of this.

## 2018-10-18 ENCOUNTER — Ambulatory Visit: Payer: Self-pay | Admitting: Family Medicine

## 2018-10-18 NOTE — Telephone Encounter (Signed)
Son calling to report she is drowsy with the medications for vertigo. States he will hold the Claritin and she how she feels. If dizziness worsens, he will add it back. Instructed if no better Monday, to call back.  Answer Assessment - Initial Assessment Questions 1. DESCRIPTION: "Describe your dizziness."     It is better 2. VERTIGO: "Do you feel like either you or the room is spinning or tilting?"      yes 3. LIGHTHEADED: "Do you feel lightheaded?" (e.g., somewhat faint, woozy, weak upon standing)     It is better 4. SEVERITY: "How bad is it?"  "Can you walk?"   - MILD - Feels unsteady but walking normally.   - MODERATE - Feels very unsteady when walking, but not falling; interferes with normal activities (e.g., school, work) .   - SEVERE - Unable to walk without falling (requires assistance).     Mild 5. ONSET:  "When did the dizziness begin?"     Started Monday morning 6. AGGRAVATING FACTORS: "Does anything make it worse?" (e.g., standing, change in head position)     Position 7. CAUSE: "What do you think is causing the dizziness?"     Unsure 8. RECURRENT SYMPTOM: "Have you had dizziness before?" If so, ask: "When was the last time?" "What happened that time?"     Yes 9. OTHER SYMPTOMS: "Do you have any other symptoms?" (e.g., headache, weakness, numbness, vomiting, earache)     No 10. PREGNANCY: "Is there any chance you are pregnant?" "When was your last menstrual period?"       No  Protocols used: DIZZINESS - VERTIGO-A-AH

## 2018-10-28 ENCOUNTER — Telehealth: Payer: Self-pay

## 2018-10-28 NOTE — Telephone Encounter (Signed)

## 2018-10-29 ENCOUNTER — Other Ambulatory Visit: Payer: Self-pay

## 2018-10-29 ENCOUNTER — Ambulatory Visit (INDEPENDENT_AMBULATORY_CARE_PROVIDER_SITE_OTHER): Payer: Medicare Other | Admitting: Pharmacist

## 2018-10-29 DIAGNOSIS — Z5181 Encounter for therapeutic drug level monitoring: Secondary | ICD-10-CM

## 2018-10-29 DIAGNOSIS — I480 Paroxysmal atrial fibrillation: Secondary | ICD-10-CM

## 2018-10-29 LAB — POCT INR: INR: 1.7 — AB (ref 2.0–3.0)

## 2018-10-29 MED ORDER — APIXABAN 5 MG PO TABS: 5.0000 mg | ORAL_TABLET | Freq: Two times a day (BID) | ORAL | 5 refills | Status: DC

## 2018-11-07 ENCOUNTER — Encounter: Payer: Medicare Other | Admitting: Family Medicine

## 2018-11-13 DIAGNOSIS — H353212 Exudative age-related macular degeneration, right eye, with inactive choroidal neovascularization: Secondary | ICD-10-CM | POA: Diagnosis not present

## 2018-11-13 DIAGNOSIS — H353211 Exudative age-related macular degeneration, right eye, with active choroidal neovascularization: Secondary | ICD-10-CM | POA: Diagnosis not present

## 2018-11-13 DIAGNOSIS — H3562 Retinal hemorrhage, left eye: Secondary | ICD-10-CM | POA: Diagnosis not present

## 2018-11-13 DIAGNOSIS — H353221 Exudative age-related macular degeneration, left eye, with active choroidal neovascularization: Secondary | ICD-10-CM | POA: Diagnosis not present

## 2018-11-18 ENCOUNTER — Telehealth: Payer: Self-pay

## 2018-11-18 NOTE — Telephone Encounter (Signed)

## 2018-11-19 ENCOUNTER — Ambulatory Visit (INDEPENDENT_AMBULATORY_CARE_PROVIDER_SITE_OTHER): Payer: Medicare Other

## 2018-11-19 ENCOUNTER — Other Ambulatory Visit: Payer: Self-pay

## 2018-11-19 DIAGNOSIS — I480 Paroxysmal atrial fibrillation: Secondary | ICD-10-CM

## 2018-11-19 DIAGNOSIS — Z5181 Encounter for therapeutic drug level monitoring: Secondary | ICD-10-CM | POA: Diagnosis not present

## 2018-11-19 LAB — POCT INR: INR: 3.2 — AB (ref 2.0–3.0)

## 2018-11-20 NOTE — Patient Instructions (Signed)
Description   Hold warfarin dose today 5/12 ONLY, then continue taking 1/2 tablet every day except 1 tablet on Sundays. Recheck INR in 3 weeks. Call with any medication changes or if scheduled for any procedures [phone].

## 2018-12-10 ENCOUNTER — Telehealth: Payer: Self-pay

## 2018-12-10 NOTE — Telephone Encounter (Signed)

## 2018-12-16 ENCOUNTER — Other Ambulatory Visit: Payer: Self-pay

## 2018-12-16 ENCOUNTER — Ambulatory Visit: Payer: Medicare Other | Admitting: *Deleted

## 2018-12-16 DIAGNOSIS — I480 Paroxysmal atrial fibrillation: Secondary | ICD-10-CM | POA: Diagnosis not present

## 2018-12-16 DIAGNOSIS — Z5181 Encounter for therapeutic drug level monitoring: Secondary | ICD-10-CM

## 2018-12-16 LAB — POCT INR: INR: 2.4 (ref 2.0–3.0)

## 2018-12-16 NOTE — Patient Instructions (Addendum)
Description   Continue taking 1/2 tablet every day. Recheck INR in 5 weeks. Call with any medication changes or if scheduled for any procedures [phone].

## 2018-12-18 DIAGNOSIS — H353222 Exudative age-related macular degeneration, left eye, with inactive choroidal neovascularization: Secondary | ICD-10-CM | POA: Diagnosis not present

## 2018-12-18 DIAGNOSIS — H3562 Retinal hemorrhage, left eye: Secondary | ICD-10-CM | POA: Diagnosis not present

## 2018-12-18 DIAGNOSIS — H353211 Exudative age-related macular degeneration, right eye, with active choroidal neovascularization: Secondary | ICD-10-CM | POA: Diagnosis not present

## 2018-12-18 DIAGNOSIS — H353212 Exudative age-related macular degeneration, right eye, with inactive choroidal neovascularization: Secondary | ICD-10-CM | POA: Diagnosis not present

## 2019-01-01 ENCOUNTER — Telehealth: Payer: Self-pay | Admitting: Cardiology

## 2019-01-01 DIAGNOSIS — I480 Paroxysmal atrial fibrillation: Secondary | ICD-10-CM

## 2019-01-01 NOTE — Telephone Encounter (Signed)
New Message              Patient is visiting her daughter in [place] and she is needing her coumadin checked, the daughter  made a appointment to get it done, and need the order emailed to her, can we do this? If so the email is [email or web address] [phone]

## 2019-01-01 NOTE — Telephone Encounter (Signed)
Returned call to [facility] where the pt is currently visiting and she stated that the pt needs her lab order faxed to [facility] for her pending appt on 01/20/2019 to have her INR checked. She was able to provide the [employer], which is [phone] and main Number [phone]; faxed order to have INR drawn on 01/20/2019 and as needed for 6 months and fax to [phone] or call [phone].

## 2019-01-15 ENCOUNTER — Encounter: Payer: Medicare Other | Admitting: Family Medicine

## 2019-01-20 ENCOUNTER — Encounter: Payer: Self-pay | Admitting: Cardiology

## 2019-01-20 DIAGNOSIS — I480 Paroxysmal atrial fibrillation: Secondary | ICD-10-CM | POA: Diagnosis not present

## 2019-01-20 NOTE — Telephone Encounter (Addendum)
Order was faxed 2 weeks ago - reprinted lab order from [facility] and faxed again. Also added an order in Epic. Confirmed with daughter that they received fax at [facility].

## 2019-01-20 NOTE — Telephone Encounter (Signed)
error 

## 2019-01-20 NOTE — Addendum Note (Signed)
Addended by: SUPPLE, MEGAN E on: 01/20/2019 12:26 PM   Modules accepted: Orders

## 2019-01-20 NOTE — Telephone Encounter (Signed)
  Daughter is calling because orders were never sent to [facility] and they went to have her INR checked. Please send the orders. Patient is at the [facility] now.

## 2019-01-21 LAB — PROTIME-INR
INR: 3.7 — ABNORMAL HIGH (ref 0.8–1.2)
Prothrombin Time: 38.2 s — ABNORMAL HIGH (ref 9.1–12.0)

## 2019-01-22 ENCOUNTER — Ambulatory Visit (INDEPENDENT_AMBULATORY_CARE_PROVIDER_SITE_OTHER): Payer: Medicare Other | Admitting: Cardiovascular Disease

## 2019-01-22 ENCOUNTER — Other Ambulatory Visit: Payer: Medicare Other

## 2019-01-22 DIAGNOSIS — Z5181 Encounter for therapeutic drug level monitoring: Secondary | ICD-10-CM

## 2019-01-22 DIAGNOSIS — I480 Paroxysmal atrial fibrillation: Secondary | ICD-10-CM

## 2019-01-22 NOTE — Patient Instructions (Signed)
Description   Spoke with and Dtr [name] and instructed pt to hold today's dose then continue taking 1/2 tablet every day. Recheck INR in 2 weeks in the office with lab appt. Call with any medication changes or if scheduled for any procedures [phone].

## 2019-02-04 ENCOUNTER — Telehealth: Payer: Self-pay | Admitting: Pharmacist

## 2019-02-04 ENCOUNTER — Other Ambulatory Visit: Payer: Medicare Other

## 2019-02-04 DIAGNOSIS — H353133 Nonexudative age-related macular degeneration, bilateral, advanced atrophic without subfoveal involvement: Secondary | ICD-10-CM | POA: Diagnosis not present

## 2019-02-04 DIAGNOSIS — H353211 Exudative age-related macular degeneration, right eye, with active choroidal neovascularization: Secondary | ICD-10-CM | POA: Diagnosis not present

## 2019-02-04 NOTE — Telephone Encounter (Signed)

## 2019-02-05 ENCOUNTER — Other Ambulatory Visit: Payer: Self-pay

## 2019-02-05 ENCOUNTER — Other Ambulatory Visit: Payer: Medicare Other

## 2019-02-05 ENCOUNTER — Ambulatory Visit (INDEPENDENT_AMBULATORY_CARE_PROVIDER_SITE_OTHER): Payer: Medicare Other

## 2019-02-05 DIAGNOSIS — Z5181 Encounter for therapeutic drug level monitoring: Secondary | ICD-10-CM | POA: Diagnosis not present

## 2019-02-05 DIAGNOSIS — H04123 Dry eye syndrome of bilateral lacrimal glands: Secondary | ICD-10-CM | POA: Diagnosis not present

## 2019-02-05 DIAGNOSIS — E785 Hyperlipidemia, unspecified: Secondary | ICD-10-CM | POA: Diagnosis not present

## 2019-02-05 DIAGNOSIS — H1851 Endothelial corneal dystrophy: Secondary | ICD-10-CM | POA: Diagnosis not present

## 2019-02-05 DIAGNOSIS — H40013 Open angle with borderline findings, low risk, bilateral: Secondary | ICD-10-CM | POA: Diagnosis not present

## 2019-02-05 DIAGNOSIS — I480 Paroxysmal atrial fibrillation: Secondary | ICD-10-CM | POA: Diagnosis not present

## 2019-02-05 LAB — POCT INR: INR: 2.3 (ref 2.0–3.0)

## 2019-02-05 NOTE — Patient Instructions (Addendum)
   Description   Continue taking 1/2 tablet every day. Recheck INR in 4 weeks. Call with any medication changes or if scheduled for any procedures [phone].

## 2019-02-06 LAB — COMPREHENSIVE METABOLIC PANEL
ALT: 18 IU/L (ref 0–32)
AST: 19 IU/L (ref 0–40)
Albumin/Globulin Ratio: 1.7 (ref 1.2–2.2)
Albumin: 4.4 g/dL (ref 3.6–4.6)
Alkaline Phosphatase: 77 IU/L (ref 39–117)
BUN/Creatinine Ratio: 21 (ref 12–28)
BUN: 18 mg/dL (ref 8–27)
Bilirubin Total: 0.5 mg/dL (ref 0.0–1.2)
CO2: 24 mmol/L (ref 20–29)
Calcium: 9.5 mg/dL (ref 8.7–10.3)
Chloride: 103 mmol/L (ref 96–106)
Creatinine, Ser: 0.87 mg/dL (ref 0.57–1.00)
GFR calc Af Amer: 71 mL/min/{1.73_m2} (ref >59)
GFR calc non Af Amer: 61 mL/min/{1.73_m2} (ref >59)
Globulin, Total: 2.6 g/dL (ref 1.5–4.5)
Glucose: 119 mg/dL — ABNORMAL HIGH (ref 65–99)
Potassium: 4.4 mmol/L (ref 3.5–5.2)
Sodium: 146 mmol/L — ABNORMAL HIGH (ref 134–144)
Total Protein: 7 g/dL (ref 6.0–8.5)

## 2019-02-06 LAB — CBC WITH DIFFERENTIAL/PLATELET
Basophils Absolute: 0.1 10*3/uL (ref 0.0–0.2)
Basos: 1 %
EOS (ABSOLUTE): 0.2 10*3/uL (ref 0.0–0.4)
Eos: 2 %
Hematocrit: 45 % (ref 34.0–46.6)
Hemoglobin: 14.8 g/dL (ref 11.1–15.9)
Immature Grans (Abs): 0 10*3/uL (ref 0.0–0.1)
Immature Granulocytes: 0 %
Lymphocytes Absolute: 3.9 10*3/uL — ABNORMAL HIGH (ref 0.7–3.1)
Lymphs: 36 %
MCH: 30.5 pg (ref 26.6–33.0)
MCHC: 32.9 g/dL (ref 31.5–35.7)
MCV: 93 fL (ref 79–97)
Monocytes Absolute: 1 10*3/uL — ABNORMAL HIGH (ref 0.1–0.9)
Monocytes: 9 %
Neutrophils Absolute: 5.6 10*3/uL (ref 1.4–7.0)
Neutrophils: 52 %
Platelets: 275 10*3/uL (ref 150–450)
RBC: 4.85 x10E6/uL (ref 3.77–5.28)
RDW: 12 % (ref 11.7–15.4)
WBC: 10.7 10*3/uL (ref 3.4–10.8)

## 2019-02-06 LAB — LIPID PANEL
Chol/HDL Ratio: 3.9 ratio (ref 0.0–4.4)
Cholesterol, Total: 196 mg/dL (ref 100–199)
HDL: 50 mg/dL (ref >39)
LDL Calculated: 109 mg/dL — ABNORMAL HIGH (ref 0–99)
Triglycerides: 186 mg/dL — ABNORMAL HIGH (ref 0–149)
VLDL Cholesterol Cal: 37 mg/dL (ref 5–40)

## 2019-02-06 LAB — TSH: TSH: 0.32 u[IU]/mL — ABNORMAL LOW (ref 0.450–4.500)

## 2019-02-07 ENCOUNTER — Other Ambulatory Visit: Payer: Self-pay | Admitting: Cardiology

## 2019-02-07 DIAGNOSIS — E785 Hyperlipidemia, unspecified: Secondary | ICD-10-CM

## 2019-02-07 DIAGNOSIS — Z5181 Encounter for therapeutic drug level monitoring: Secondary | ICD-10-CM

## 2019-02-11 ENCOUNTER — Other Ambulatory Visit: Payer: Self-pay | Admitting: Cardiology

## 2019-02-11 DIAGNOSIS — H3562 Retinal hemorrhage, left eye: Secondary | ICD-10-CM | POA: Diagnosis not present

## 2019-02-11 DIAGNOSIS — H353221 Exudative age-related macular degeneration, left eye, with active choroidal neovascularization: Secondary | ICD-10-CM | POA: Diagnosis not present

## 2019-02-11 DIAGNOSIS — E785 Hyperlipidemia, unspecified: Secondary | ICD-10-CM

## 2019-02-11 DIAGNOSIS — Z5181 Encounter for therapeutic drug level monitoring: Secondary | ICD-10-CM

## 2019-02-12 ENCOUNTER — Ambulatory Visit (INDEPENDENT_AMBULATORY_CARE_PROVIDER_SITE_OTHER): Payer: Medicare Other | Admitting: Family Medicine

## 2019-02-12 ENCOUNTER — Telehealth: Payer: Self-pay | Admitting: Family Medicine

## 2019-02-12 ENCOUNTER — Encounter: Payer: Self-pay | Admitting: Family Medicine

## 2019-02-12 ENCOUNTER — Other Ambulatory Visit: Payer: Self-pay

## 2019-02-12 VITALS — BP 150/80 | HR 67 | Temp 97.3°F | Wt 139.6 lb

## 2019-02-12 DIAGNOSIS — Z5181 Encounter for therapeutic drug level monitoring: Secondary | ICD-10-CM

## 2019-02-12 DIAGNOSIS — R739 Hyperglycemia, unspecified: Secondary | ICD-10-CM | POA: Diagnosis not present

## 2019-02-12 DIAGNOSIS — R946 Abnormal results of thyroid function studies: Secondary | ICD-10-CM

## 2019-02-12 DIAGNOSIS — Z0000 Encounter for general adult medical examination without abnormal findings: Secondary | ICD-10-CM

## 2019-02-12 DIAGNOSIS — R7989 Other specified abnormal findings of blood chemistry: Secondary | ICD-10-CM

## 2019-02-12 DIAGNOSIS — E785 Hyperlipidemia, unspecified: Secondary | ICD-10-CM

## 2019-02-12 LAB — POC URINALSYSI DIPSTICK (AUTOMATED)
Bilirubin, UA: NEGATIVE
Blood, UA: NEGATIVE
Glucose, UA: NEGATIVE
Ketones, UA: NEGATIVE
Leukocytes, UA: NEGATIVE
Nitrite, UA: NEGATIVE
Protein, UA: NEGATIVE
Spec Grav, UA: 1.015 (ref 1.010–1.025)
Urobilinogen, UA: 0.2 E.U./dL
pH, UA: 7 (ref 5.0–8.0)

## 2019-02-12 LAB — HEMOGLOBIN A1C: Hgb A1c MFr Bld: 6.6 % — ABNORMAL HIGH (ref 4.6–6.5)

## 2019-02-12 LAB — T4, FREE: Free T4: 1 ng/dL (ref 0.60–1.60)

## 2019-02-12 LAB — T3, FREE: T3, Free: 3.5 pg/mL (ref 2.3–4.2)

## 2019-02-12 LAB — TSH: TSH: 0.32 u[IU]/mL — ABNORMAL LOW (ref 0.35–4.50)

## 2019-02-12 MED ORDER — DONEPEZIL HCL 10 MG PO TABS: 10.0000 mg | ORAL_TABLET | Freq: Every day | ORAL | 3 refills | Status: DC

## 2019-02-12 MED ORDER — MEMANTINE HCL 10 MG PO TABS: 10.0000 mg | ORAL_TABLET | Freq: Two times a day (BID) | ORAL | 3 refills | Status: DC

## 2019-02-12 MED ORDER — VERAPAMIL HCL ER 240 MG PO TBCR: 240.0000 mg | EXTENDED_RELEASE_TABLET | Freq: Every day | ORAL | 3 refills | Status: DC

## 2019-02-12 MED ORDER — HYDROCHLOROTHIAZIDE 25 MG PO TABS: 25.0000 mg | ORAL_TABLET | Freq: Every day | ORAL | 3 refills | Status: DC

## 2019-02-12 MED ORDER — ATORVASTATIN CALCIUM 20 MG PO TABS: 20.0000 mg | ORAL_TABLET | Freq: Every day | ORAL | 3 refills | Status: DC

## 2019-02-12 MED ORDER — LOSARTAN POTASSIUM 100 MG PO TABS: 100.0000 mg | ORAL_TABLET | Freq: Every day | ORAL | 3 refills | Status: DC

## 2019-02-12 NOTE — Progress Notes (Signed)
   Subjective:    Patient ID: Cynthia Mathis, female    DOB: Sep 01, 1932, 85 y.o.   MRN: [id number]  HPI Here for a well exam. She is doing well physically. She still grieves the loss of her husband but she has good support from her family.    Review of Systems  Constitutional: Negative.   HENT: Negative.   Eyes: Negative.   Respiratory: Negative.   Cardiovascular: Negative.   Gastrointestinal: Negative.   Genitourinary: Negative for decreased urine volume, difficulty urinating, dyspareunia, dysuria, enuresis, flank pain, frequency, hematuria, pelvic pain and urgency.  Musculoskeletal: Negative.   Skin: Negative.   Neurological: Negative.   Psychiatric/Behavioral: Negative.        Objective:   Physical Exam Constitutional:      General: She is not in acute distress.    Appearance: She is well-developed.  HENT:     Head: Normocephalic and atraumatic.     Right Ear: External ear normal.     Left Ear: External ear normal.     Nose: Nose normal.     Mouth/Throat:     Pharynx: No oropharyngeal exudate.  Eyes:     General: No scleral icterus.    Conjunctiva/sclera: Conjunctivae normal.     Pupils: Pupils are equal, round, and reactive to light.  Neck:     Musculoskeletal: Normal range of motion and neck supple.     Thyroid: No thyromegaly.     Vascular: No JVD.  Cardiovascular:     Rate and Rhythm: Normal rate and regular rhythm.     Heart sounds: Normal heart sounds. No murmur. No friction rub. No gallop.   Pulmonary:     Effort: Pulmonary effort is normal. No respiratory distress.     Breath sounds: Normal breath sounds. No wheezing or rales.  Chest:     Chest wall: No tenderness.  Abdominal:     General: Bowel sounds are normal. There is no distension.     Palpations: Abdomen is soft. There is no mass.     Tenderness: There is no abdominal tenderness. There is no guarding or rebound.  Musculoskeletal: Normal range of motion.        General: No tenderness.   Lymphadenopathy:     Cervical: No cervical adenopathy.  Skin:    General: Skin is warm and dry.     Findings: No erythema or rash.  Neurological:     Mental Status: She is alert and oriented to person, place, and time.     Cranial Nerves: No cranial nerve deficit.     Motor: No abnormal muscle tone.     Coordination: Coordination normal.     Deep Tendon Reflexes: Reflexes are normal and symmetric. Reflexes normal.  Psychiatric:        Behavior: Behavior normal.        Thought Content: Thought content normal.        Judgment: Judgment normal.           Assessment & Plan:  Well exam. We discussed diet and exercise. Get fasting labs. She will see Dr. Meda Coffee again soon for a cardiologic follow up.  [name], MD

## 2019-02-12 NOTE — Telephone Encounter (Signed)
Pt had Viteral appt this morning and has questions about having shingle shots and she is taking warfarin and want to know why does she still need to take it. Please call pt to advise

## 2019-02-13 NOTE — Telephone Encounter (Signed)
The Shingrix rx is ready to fax

## 2019-02-13 NOTE — Telephone Encounter (Signed)
Not my patient.  She is checked at the clinic at [facility].

## 2019-02-13 NOTE — Telephone Encounter (Signed)
Rx has been faked. Nothing further needed.

## 2019-02-13 NOTE — Telephone Encounter (Signed)
Noted  

## 2019-02-13 NOTE — Telephone Encounter (Signed)
Spoke with patient. She wanted to know how much the shingles vaccine is. I advised toe patient that due to her insurance she will have to get her shingles vaccines from her pharmacy. She would like an Rx sent to [facility]. The patient wanted to know if she should continue her warfarin. She has an appointment with the coumadin clinic at the end of the month. I advised her to continue her Coumadin per usual and that Jenny Reichmann can advise her further on this at her appointment.   Dr. Sarajane Jews, ok to send in Rx for shingles vaccine?   Will send to [facility] as FYI.

## 2019-02-18 ENCOUNTER — Other Ambulatory Visit: Payer: Self-pay | Admitting: Cardiology

## 2019-02-18 DIAGNOSIS — Z1231 Encounter for screening mammogram for malignant neoplasm of breast: Secondary | ICD-10-CM | POA: Diagnosis not present

## 2019-02-18 DIAGNOSIS — I480 Paroxysmal atrial fibrillation: Secondary | ICD-10-CM

## 2019-02-18 LAB — HM MAMMOGRAPHY

## 2019-02-25 ENCOUNTER — Encounter: Payer: Self-pay | Admitting: Family Medicine

## 2019-02-25 ENCOUNTER — Telehealth: Payer: Self-pay | Admitting: Family Medicine

## 2019-02-25 DIAGNOSIS — N6311 Unspecified lump in the right breast, upper outer quadrant: Secondary | ICD-10-CM | POA: Diagnosis not present

## 2019-02-25 DIAGNOSIS — N6489 Other specified disorders of breast: Secondary | ICD-10-CM | POA: Diagnosis not present

## 2019-02-25 DIAGNOSIS — R922 Inconclusive mammogram: Secondary | ICD-10-CM | POA: Diagnosis not present

## 2019-02-25 NOTE — Telephone Encounter (Signed)
Copied from [place] [id number]. Topic: General - Other >> Feb 25, 2019  1:41 PM Keene Breath wrote: Reason for CRM: Solace called to ask the doctor if the patient could stop her Warfarin before she has her Biopsy or is it ok for the biopsy while she is on Warfarin.  Please advise and call back at [phone]

## 2019-02-26 DIAGNOSIS — M1711 Unilateral primary osteoarthritis, right knee: Secondary | ICD-10-CM | POA: Diagnosis not present

## 2019-02-26 DIAGNOSIS — M25561 Pain in right knee: Secondary | ICD-10-CM | POA: Diagnosis not present

## 2019-02-26 NOTE — Telephone Encounter (Signed)
She can be off this for 3 days prior to the procedure

## 2019-02-26 NOTE — Telephone Encounter (Signed)
Cynthia Mathis with [facility] calling.  States that message needs to be:    They need pt to come off of medication for procedure as long as doctor believes it is safe. If pt can come off of medication they need to know how long pt can be off of it.  Cynthia Mathis can be reached at [phone].  Per [facility] send CRM.

## 2019-02-26 NOTE — Telephone Encounter (Signed)
Please advise. Should patient still continue her medication as instructed below?

## 2019-02-26 NOTE — Telephone Encounter (Signed)
Left a detailed message on verified voice mail.   

## 2019-02-26 NOTE — Telephone Encounter (Signed)
She can stay on the warfarin as usual

## 2019-02-28 ENCOUNTER — Encounter: Payer: Self-pay | Admitting: Family Medicine

## 2019-03-05 ENCOUNTER — Other Ambulatory Visit: Payer: Self-pay | Admitting: Radiology

## 2019-03-05 ENCOUNTER — Encounter: Payer: Self-pay | Admitting: Family Medicine

## 2019-03-05 ENCOUNTER — Other Ambulatory Visit: Payer: Self-pay

## 2019-03-05 ENCOUNTER — Ambulatory Visit (INDEPENDENT_AMBULATORY_CARE_PROVIDER_SITE_OTHER): Payer: Medicare Other | Admitting: *Deleted

## 2019-03-05 DIAGNOSIS — I480 Paroxysmal atrial fibrillation: Secondary | ICD-10-CM | POA: Diagnosis not present

## 2019-03-05 DIAGNOSIS — Z5181 Encounter for therapeutic drug level monitoring: Secondary | ICD-10-CM | POA: Diagnosis not present

## 2019-03-05 DIAGNOSIS — C50411 Malignant neoplasm of upper-outer quadrant of right female breast: Secondary | ICD-10-CM | POA: Diagnosis not present

## 2019-03-05 DIAGNOSIS — N6311 Unspecified lump in the right breast, upper outer quadrant: Secondary | ICD-10-CM | POA: Diagnosis not present

## 2019-03-05 LAB — POCT INR: INR: 1.3 — AB (ref 2.0–3.0)

## 2019-03-05 NOTE — Patient Instructions (Addendum)
Description    Continue taking 1/2 tablet every day. Recheck INR in 1 week. Call with any medication changes or if scheduled for any procedures [phone]. Informed pt to resume coumadin tonight, unless the Dr doing biopsy states otherwise.

## 2019-03-12 ENCOUNTER — Ambulatory Visit (INDEPENDENT_AMBULATORY_CARE_PROVIDER_SITE_OTHER): Payer: Medicare Other

## 2019-03-12 ENCOUNTER — Other Ambulatory Visit: Payer: Self-pay

## 2019-03-12 DIAGNOSIS — Z5181 Encounter for therapeutic drug level monitoring: Secondary | ICD-10-CM

## 2019-03-12 DIAGNOSIS — I480 Paroxysmal atrial fibrillation: Secondary | ICD-10-CM | POA: Diagnosis not present

## 2019-03-12 LAB — POCT INR: INR: 1.9 — AB (ref 2.0–3.0)

## 2019-03-12 NOTE — Patient Instructions (Signed)
Description   Take 1 tablet today, then resume 1/2 tablet every day. Recheck INR in 4 weeks. Call with any medication changes or if scheduled for any procedures [phone].

## 2019-03-18 DIAGNOSIS — H35351 Cystoid macular degeneration, right eye: Secondary | ICD-10-CM | POA: Diagnosis not present

## 2019-03-18 DIAGNOSIS — H353211 Exudative age-related macular degeneration, right eye, with active choroidal neovascularization: Secondary | ICD-10-CM | POA: Diagnosis not present

## 2019-03-19 ENCOUNTER — Ambulatory Visit: Payer: Self-pay | Admitting: General Surgery

## 2019-03-19 ENCOUNTER — Telehealth: Payer: Self-pay | Admitting: *Deleted

## 2019-03-19 DIAGNOSIS — C50411 Malignant neoplasm of upper-outer quadrant of right female breast: Secondary | ICD-10-CM | POA: Diagnosis not present

## 2019-03-19 DIAGNOSIS — Z170 Estrogen receptor positive status [ER+]: Secondary | ICD-10-CM | POA: Diagnosis not present

## 2019-03-19 NOTE — Telephone Encounter (Signed)
   [facility] Pre-operative Risk Assessment    Request for surgical clearance:  1. What type of surgery is being performed? BREAST LUMPECTOMY   2. When is this surgery scheduled?  TBD   3. What type of clearance is required (medical clearance vs. Pharmacy clearance to hold med vs. Both)? BOTH  4. Are there any medications that need to be held prior to surgery and how long? WARFARIN 5 DAYS PRIOR TO SURGERY   5. Practice name and name of physician performing surgery? [facility]; DR. PAUL TOTH   6. What is your office phone number [phone]    7.   What is your office fax number [phone]  8.   Anesthesia type (None, local, MAC, general) ?  GENERAL   [name] 03/19/2019, 2:47 PM  _________________________________________________________________   (provider comments below)

## 2019-03-19 NOTE — Telephone Encounter (Signed)
Pt takes warfarin for afib with CHADS2VASc score of 5 (age x2, sex, HTN, DM). Ok to hold warfarin for 5 days prior to procedure. 

## 2019-03-20 ENCOUNTER — Telehealth: Payer: Self-pay | Admitting: Oncology

## 2019-03-20 NOTE — Telephone Encounter (Signed)
Received a new patient referral from Dr. Marlou Starks at [facility] for new dx of breast cancer. I cld and spoke to Ms. Grubb son and scheduled her to see Dr. Jana Hakim on 9/16 at 4pm w/labs at 330pm. He's been made aware that his mom should arrive 15 minutes early. I provided the location to our facility.

## 2019-03-20 NOTE — Telephone Encounter (Signed)
Called pt and scheduled appt with [name] 03/28/2019 @ 8:00 AM. She will arrive early, wear a mask and arrive alone, no visitors. Verbalizes understanding.

## 2019-03-20 NOTE — Telephone Encounter (Signed)
   Primary Cardiologist:Katarina Meda Coffee, MD  Chart reviewed as part of pre-operative protocol coverage. Because of Cynthia Mathis's past medical history and time since last visit, he/she will require a follow-up visit in order to better assess preoperative cardiovascular risk.  Per pharmacy recommendations, patient can hold coumadin 5 days prior to her upcoming surgery.   Pre-op covering staff: - Please schedule appointment and call patient to inform them. - Please contact requesting surgeon's office via preferred method (i.e, phone, fax) to inform them of need for appointment prior to surgery.   Abigail Butts, PA-C  03/20/2019, 2:26 PM

## 2019-03-24 ENCOUNTER — Encounter: Payer: Self-pay | Admitting: *Deleted

## 2019-03-25 ENCOUNTER — Other Ambulatory Visit: Payer: Self-pay | Admitting: *Deleted

## 2019-03-25 DIAGNOSIS — H26492 Other secondary cataract, left eye: Secondary | ICD-10-CM | POA: Diagnosis not present

## 2019-03-25 DIAGNOSIS — H353221 Exudative age-related macular degeneration, left eye, with active choroidal neovascularization: Secondary | ICD-10-CM | POA: Diagnosis not present

## 2019-03-25 DIAGNOSIS — C50919 Malignant neoplasm of unspecified site of unspecified female breast: Secondary | ICD-10-CM

## 2019-03-25 DIAGNOSIS — H3562 Retinal hemorrhage, left eye: Secondary | ICD-10-CM | POA: Diagnosis not present

## 2019-03-25 DIAGNOSIS — H353222 Exudative age-related macular degeneration, left eye, with inactive choroidal neovascularization: Secondary | ICD-10-CM | POA: Diagnosis not present

## 2019-03-25 DIAGNOSIS — H353211 Exudative age-related macular degeneration, right eye, with active choroidal neovascularization: Secondary | ICD-10-CM | POA: Diagnosis not present

## 2019-03-25 NOTE — Progress Notes (Signed)
[place]  [phone] [phone]) [phone]     ID: Varonica Koloski DOB: 1933/01/31  MR#: [id number]  UUV#:[id number]  Patient Care Team: [name], MD as PCP - General (Family Medicine) [name], MD as PCP - Cardiology (Cardiology) Hyun Reali, [name], MD as Consulting Physician (Oncology) [name], MD as Consulting Physician (General Surgery) [name], RN as Oncology Nurse Navigator [name], MD as Consulting Physician (Cardiology) Chauncey Cruel, MD OTHER MD:  CHIEF COMPLAINT: estrogen receptor positive breast cancer  CURRENT TREATMENT: awaiting definitive surgery   HISTORY OF CURRENT ILLNESS: Ines Cedeno had routine screening mammography at [facility] on 02/18/2019 showing a possible abnormality in the right breast. She underwent right diagnostic mammography with tomography and right breast ultrasonography at [facility] on 02/25/2019 showing: breast density category B; 0.6 cm irregular mass at 11 o'clock in the right breast; no abnormal right axillary lymph nodes.  Accordingly on 03/05/2019 she proceeded to biopsy of the right breast area in question. The pathology from this procedure ([id number]) showed: invasive ductal carcinoma, grade 1. Prognostic indicators significant for: estrogen receptor, 95% positive and progesterone receptor, 95% positive, both with strong staining intensity. Proliferation marker Ki67 at 10%. HER2 negative by immunohistochemistry (1+).  The patient's subsequent history is as detailed below.   INTERVAL HISTORY: Leidy was evaluated in the breast cancer clinic on 03/26/2019 accompanied by her son [name]. Her case was also presented at the multidisciplinary breast cancer conference 03/12/2019. At that time a preliminary plan was proposed: Breast conserving surgery, antiestrogens, likely no adjuvant radiation   REVIEW OF SYSTEMS: There were no specific symptoms leading to the original  mammogram, which was routinely scheduled. The patient denies unusual headaches, visual changes, nausea, vomiting, stiff neck, dizziness, or gait imbalance. There has been no cough, phlegm production, or pleurisy, no chest pain or pressure, and no change in bowel or bladder habits. The patient denies fever, rash, bleeding, unexplained fatigue or unexplained weight loss. A detailed review of systems was otherwise entirely negative.   PAST MEDICAL HISTORY: Past Medical History:  Diagnosis Date  . Anxiety   . Borderline diabetes mellitus   . Diverticulosis of colon   . DJD (degenerative joint disease)   . Dyslipidemia   . History of hematuria   . History of osteoporosis   . Hx of colonic polyps   . Hypertension   . Hypothyroid   . Macular degeneration    sees [name] for general eye care, sees [name] for retinal injections   . Memory loss   . Osteoporosis    took bisphosphonates, now off. Last DEXA in 2015 showed osteopenia   . Paroxysmal atrial fibrillation (HCC)    sees [name]     PAST SURGICAL HISTORY: Past Surgical History:  Procedure Laterality Date  . APPENDECTOMY  1947  . COLONOSCOPY  12/02/2014   per [name], severe diverticulosis but no polyps   . MASS EXCISION  04/17/2012   Procedure: EXCISION MASS;  Surgeon: [name], MD;  Location: [facility];  Service: General;  Laterality: N/A;  . THYROIDECTOMY  1990   [name]    FAMILY HISTORY: Family History  Problem Relation Age of Onset  . Stroke Mother   . Colon cancer Neg Hx   . Stomach cancer Neg Hx    Patient's father was 60 years old when he died in 57 War II. Patient's mother died from causes not clear to the patient  at age 73.  The patient had 1 brother, no sisters.  The patient denies/or notes a family hx of breast or ovarian cancer.     GYNECOLOGIC HISTORY:  No LMP recorded. Patient is postmenopausal. Menarche: 85 years old Age at first live birth: 85 years  old [place] P 3 LMP early 64s Contraceptive no HRT no  Hysterectomy?  No BSO?  No   SOCIAL HISTORY: (updated September 2020)  Gionna was born [place], in [place].  She worked with [place] family is in [place] and that is where she met her husband who was born in [place] but came to [place] as a young man and enlisted in Librarian, academic from [place] where he was living.  Layla Barter today came to [place] with him and after living in [place] for some time they moved to [place] where her husband had a job.  He was an Training and development officer.  Their 3 children are [name] 11 who lives in [place] and is an Futures trader; [name] 62 lives in [place] and keeps a day school in her home; and [name] in [place] who works with wood the patient has 7 grandchildren, no great grandchildren.  She is not a Publishing rights manager.  ADVANCED DIRECTIVES:    HEALTH MAINTENANCE: Social History   Tobacco Use  . Smoking status: Former Smoker    Quit date: 07/10/1965    Years since quitting: 53.7  . Smokeless tobacco: Never Used  . Tobacco comment: 3 cigs per week x 3 years (started in 1964), 12/27/15 does not smoke  Substance Use Topics  . Alcohol use: Yes    Alcohol/week: 0.0 standard drinks    Comment: occasionally  . Drug use: No     Colonoscopy: 2016/[facility]  PAP: Up-to-date  Bone density: 2015, normal   Allergies  Allergen Reactions  . Epinephrine Palpitations    REACTION: heart racing    Current Outpatient Medications  Medication Sig Dispense Refill  . atorvastatin (LIPITOR) 20 MG tablet Take 1 tablet (20 mg total) by mouth daily. 90 tablet 3  . Calcium Carbonate-Vitamin D (CALCIUM 600+D) 600-400 MG-UNIT per tablet Take 1 tablet by mouth daily.    . Cyanocobalamin (VITAMIN B 12 PO) Take 1 tablet by mouth daily. Reported on 12/27/2015    . donepezil (ARICEPT) 10 MG tablet Take 1 tablet (10 mg total) by mouth at bedtime. 90 tablet 3  . fish oil-omega-3 fatty acids 1000 MG capsule Take 1 g by mouth  daily.    [name] glucosamine-chondroitin 500-400 MG tablet Take 1 tablet by mouth 3 (three) times daily.    . hydrochlorothiazide (HYDRODIURIL) 25 MG tablet Take 1 tablet (25 mg total) by mouth daily. 90 tablet 3  . losartan (COZAAR) 100 MG tablet Take 1 tablet (100 mg total) by mouth daily. 90 tablet 3  . memantine (NAMENDA) 10 MG tablet Take 1 tablet (10 mg total) by mouth 2 (two) times daily. 180 tablet 3  . Multiple Vitamins-Minerals (MULTIVITAMIN & MINERAL PO) Take 1 tablet by mouth daily.    . verapamil (CALAN-SR) 240 MG CR tablet Take 1 tablet (240 mg total) by mouth at bedtime. Please make overdue appt with Dr. Meda Coffee before anymore refills. 1st attempt 90 tablet 3  . warfarin (COUMADIN) 5 MG tablet TAKE BY MOUTH AS DIRECTED BY COUMADIN CLINIC 90 tablet 2  . meclizine (ANTIVERT) 25 MG tablet Take 1 tablet (25 mg total) by mouth every 4 (four) hours as needed for dizziness. (Patient not taking: Reported on 03/26/2019)  60 tablet 1   No current facility-administered medications for this visit.     OBJECTIVE: Elderly white woman in no acute distress  Vitals:   03/26/19 1542  BP: (!) 171/68  Pulse: (!) 54  Resp: 18  Temp: 97.8 F (36.6 C)  SpO2: 98%     Body mass index is 24.76 kg/m.   Wt Readings from Last 3 Encounters:  03/26/19 139 lb 12.8 oz (63.4 kg)  02/12/19 139 lb 9.6 oz (63.3 kg)  09/20/18 140 lb 8 oz (63.7 kg)      ECOG FS:1 - Symptomatic but completely ambulatory  Ocular: Sclerae unicteric, pupils round and equal Ear-nose-throat: Oropharynx clear and moist Lymphatic: No cervical or supraclavicular adenopathy Lungs no rales or rhonchi Heart regular rate and rhythm Abd soft, nontender, positive bowel sounds MSK no focal spinal tenderness, no joint edema Neuro: non-focal, well-oriented, appropriate affect Breasts: The right breast is status post recent biopsy.  There is no significant ecchymosis.  There is no palpable mass.  The left breast is benign.  Both axillae are  benign.   LAB RESULTS:  CMP     Component Value Date/Time   NA 141 03/26/2019 1521   NA 146 (H) 02/05/2019 0852   K 4.0 03/26/2019 1521   CL 102 03/26/2019 1521   CO2 32 03/26/2019 1521   GLUCOSE 117 (H) 03/26/2019 1521   BUN 29 (H) 03/26/2019 1521   BUN 18 02/05/2019 0852   CREATININE 1.06 (H) 03/26/2019 1521   CALCIUM 9.6 03/26/2019 1521   PROT 7.1 03/26/2019 1521   PROT 7.0 02/05/2019 0852   ALBUMIN 3.9 03/26/2019 1521   ALBUMIN 4.4 02/05/2019 0852   AST 17 03/26/2019 1521   ALT 17 03/26/2019 1521   ALKPHOS 77 03/26/2019 1521   BILITOT 0.3 03/26/2019 1521   GFRNONAA 48 (L) 03/26/2019 1521   GFRAA 56 (L) 03/26/2019 1521    No results found for: TOTALPROTELP, ALBUMINELP, A1GS, A2GS, BETS, BETA2SER, GAMS, MSPIKE, SPEI  No results found for: KPAFRELGTCHN, LAMBDASER, KAPLAMBRATIO  Lab Results  Component Value Date   WBC 10.8 (H) 03/26/2019   NEUTROABS 6.7 03/26/2019   HGB 13.8 03/26/2019   HCT 41.7 03/26/2019   MCV 92.3 03/26/2019   PLT 258 03/26/2019    '@LASTCHEMISTRY' @  No results found for: LABCA2  No components found for: BSJGGE366  No results for input(s): INR in the last 168 hours.  No results found for: LABCA2  No results found for: QHU765  No results found for: YYT035  No results found for: WSF681  No results found for: CA2729  No components found for: HGQUANT  No results found for: CEA1 / No results found for: CEA1   No results found for: AFPTUMOR  No results found for: CHROMOGRNA  No results found for: PSA1  Appointment on 03/26/2019  Component Date Value Ref Range Status  . Sodium 03/26/2019 141  135 - 145 mmol/L Final  . Potassium 03/26/2019 4.0  3.5 - 5.1 mmol/L Final  . Chloride 03/26/2019 102  98 - 111 mmol/L Final  . CO2 03/26/2019 32  22 - 32 mmol/L Final  . Glucose, Bld 03/26/2019 117* 70 - 99 mg/dL Final  . BUN 03/26/2019 29* 8 - 23 mg/dL Final  . Creatinine 03/26/2019 1.06* 0.44 - 1.00 mg/dL Final  . Calcium 03/26/2019  9.6  8.9 - 10.3 mg/dL Final  . Total Protein 03/26/2019 7.1  6.5 - 8.1 g/dL Final  . Albumin 03/26/2019 3.9  3.5 - 5.0 g/dL  Final  . AST 03/26/2019 17  15 - 41 U/L Final  . ALT 03/26/2019 17  0 - 44 U/L Final  . Alkaline Phosphatase 03/26/2019 77  38 - 126 U/L Final  . Total Bilirubin 03/26/2019 0.3  0.3 - 1.2 mg/dL Final  . GFR, Est Non Af Am 03/26/2019 48* >60 mL/min Final  . GFR, Est AFR Am 03/26/2019 56* >60 mL/min Final  . Anion gap 03/26/2019 7  5 - 15 Final   Performed at [facility], [place] [address] [place], [place] [address]  . WBC Count 03/26/2019 10.8* 4.0 - 10.5 K/uL Final  . RBC 03/26/2019 4.52  3.87 - 5.11 MIL/uL Final  . Hemoglobin 03/26/2019 13.8  12.0 - 15.0 g/dL Final  . HCT 03/26/2019 41.7  36.0 - 46.0 % Final  . MCV 03/26/2019 92.3  80.0 - 100.0 fL Final  . MCH 03/26/2019 30.5  26.0 - 34.0 pg Final  . MCHC 03/26/2019 33.1  30.0 - 36.0 g/dL Final  . RDW 03/26/2019 13.2  11.5 - 15.5 % Final  . Platelet Count 03/26/2019 258  150 - 400 K/uL Final  . nRBC 03/26/2019 0.0  0.0 - 0.2 % Final  . Neutrophils Relative % 03/26/2019 62  % Final  . Neutro Abs 03/26/2019 6.7  1.7 - 7.7 K/uL Final  . Lymphocytes Relative 03/26/2019 25  % Final  . Lymphs Abs 03/26/2019 2.7  0.7 - 4.0 K/uL Final  . Monocytes Relative 03/26/2019 9  % Final  . Monocytes Absolute 03/26/2019 0.9  0.1 - 1.0 K/uL Final  . Eosinophils Relative 03/26/2019 3  % Final  . Eosinophils Absolute 03/26/2019 0.4  0.0 - 0.5 K/uL Final  . Basophils Relative 03/26/2019 1  % Final  . Basophils Absolute 03/26/2019 0.1  0.0 - 0.1 K/uL Final  . Immature Granulocytes 03/26/2019 0  % Final  . Abs Immature Granulocytes 03/26/2019 0.03  0.00 - 0.07 K/uL Final   Performed at [facility], [place] [address] [place], [place] [address]    (this displays the last labs from the last 3 days)  No results found for: TOTALPROTELP, ALBUMINELP, A1GS, A2GS, BETS, BETA2SER, GAMS,  MSPIKE, SPEI (this displays SPEP labs)  No results found for: KPAFRELGTCHN, LAMBDASER, KAPLAMBRATIO (kappa/lambda light chains)  No results found for: HGBA, HGBA2QUANT, HGBFQUANT, HGBSQUAN (Hemoglobinopathy evaluation)   No results found for: LDH  Lab Results  Component Value Date   IRON 72 05/15/2012   IRONPCTSAT 24.6 05/15/2012   (Iron and TIBC)  No results found for: FERRITIN  Urinalysis    Component Value Date/Time   COLORURINE LT YELLOW 11/12/2007 0945   APPEARANCEUR Clear 11/12/2007 0945   LABSPEC < OR = 1.005 11/12/2007 0945   PHURINE 5.5 11/12/2007 0945   GLUCOSEU NEGATIVE 11/12/2007 0945   BILIRUBINUR negative 02/12/2019 1056   KETONESUR NEGATIVE 11/12/2007 0945   PROTEINUR Negative 02/12/2019 1056   UROBILINOGEN 0.2 02/12/2019 1056   UROBILINOGEN 0.2 mg/dL 11/12/2007 0945   NITRITE negative 02/12/2019 1056   NITRITE Negative 11/12/2007 0945   LEUKOCYTESUR Negative 02/12/2019 1056     STUDIES: No results found.  ELIGIBLE FOR AVAILABLE RESEARCH PROTOCOL: no  ASSESSMENT: 85 y.o. [place] woman status post right breast upper inner quadrant biopsy 03/05/2019 for a clinical T1b N0, stage IA invasive ductal carcinoma, grade 1, estrogen and progesterone receptor positive, HER-2 not amplified, with an MIB-1 of 10%  (1) definitive surgery pending, no sentinel lymph node sampling planned  (2)  to start anastrozole after surgery (target date 05/11/2019)  (a) bone density November 2020  PLAN: I spent approximately 60 minutes face to face with Gayla with more than 50% of that time spent in counseling and coordination of care. Specifically we reviewed the biology of the patient's diagnosis and the specifics of her situation.  We first reviewed the fact that cancer is not one disease but more than 100 different diseases and that it is important to keep them separate-- otherwise when friends and relatives discuss their own cancer experiences with [place] confusion  can result. Similarly we explained that if breast cancer spreads to the bone or liver, the patient would not have bone cancer or liver cancer, but breast cancer in the bone and breast cancer in the liver: one cancer in three places-- not 3 different cancers which otherwise would have to be treated in 3 different ways.  We discussed the difference between local and systemic therapy. In terms of loco-regional treatment, lumpectomy plus radiation is equivalent to mastectomy as far as survival is concerned. For this reason, and because the cosmetic results are generally superior, we recommend breast conserving surgery.   Next we went over the options for systemic therapy which are anti-estrogens, anti-HER-2 immunotherapy, and chemotherapy. Tannia does not meet criteria for anti-HER-2 immunotherapy. She is a good candidate for anti-estrogens.  The question of chemotherapy is more complicated. Chemotherapy is most effective in rapidly growing, aggressive tumors. It is much less effective in low-grade, slow growing cancers, like Bryelle 's.  For that reason and given the very low anatomic risk in this case as well as the patient's advanced age, no chemotherapy is planned.  Given that decision sentinel lymph node sampling is not felt to be needed since the chief purpose of that procedure is to determine risk with a view to possible chemotherapy  We discussed the studies that show that women over 70 with low grade node-negative estrogen receptor positive tumors who receive antiestrogen therapy for 5 years may safely avoid radiation.  She is interested in that.  Accordingly the plan is for lumpectomy to be followed by antiestrogen's.  We discussed the difference between tamoxifen and anastrozole in detail and she has a good understanding of the possible toxicities side effects and complications of these agents.  She will be starting anastrozole 05/11/2019.  She will have a bone density a few weeks later and  she will see me the first week in December to review all that.  If her bone density is favorable and she tolerates anastrozole well the plan will be to continue that a total of 5 years.  Otherwise she could be observed off antiestrogen's  Ettamae has a good understanding of the overall plan. She agrees with it. She knows the goal of treatment in her case is cure. She will call with any problems that may develop before her next visit here.   Chauncey Cruel, MD   03/26/2019 4:48 PM Medical Oncology and Hematology [facility] [place]  [address] Tel. [phone]    Fax. [phone]   This document serves as a record of services personally performed by [name], MD. It was created on his behalf by Wilburn Mylar, a trained medical scribe. The creation of this record is based on the scribe's personal observations and the provider's statements to them.   I, [name] MD, have reviewed the above documentation for accuracy and completeness, and I agree with the above.

## 2019-03-26 ENCOUNTER — Inpatient Hospital Stay: Payer: Medicare Other

## 2019-03-26 ENCOUNTER — Other Ambulatory Visit: Payer: Self-pay

## 2019-03-26 ENCOUNTER — Inpatient Hospital Stay: Payer: Medicare Other | Attending: Oncology | Admitting: Oncology

## 2019-03-26 VITALS — BP 171/68 | HR 54 | Temp 97.8°F | Resp 18 | Ht 63.0 in | Wt 139.8 lb

## 2019-03-26 DIAGNOSIS — Z79899 Other long term (current) drug therapy: Secondary | ICD-10-CM | POA: Diagnosis not present

## 2019-03-26 DIAGNOSIS — G3184 Mild cognitive impairment, so stated: Secondary | ICD-10-CM | POA: Diagnosis not present

## 2019-03-26 DIAGNOSIS — M8589 Other specified disorders of bone density and structure, multiple sites: Secondary | ICD-10-CM | POA: Diagnosis not present

## 2019-03-26 DIAGNOSIS — C50919 Malignant neoplasm of unspecified site of unspecified female breast: Secondary | ICD-10-CM

## 2019-03-26 DIAGNOSIS — Z170 Estrogen receptor positive status [ER+]: Secondary | ICD-10-CM | POA: Diagnosis not present

## 2019-03-26 DIAGNOSIS — C50211 Malignant neoplasm of upper-inner quadrant of right female breast: Secondary | ICD-10-CM | POA: Diagnosis not present

## 2019-03-26 DIAGNOSIS — M810 Age-related osteoporosis without current pathological fracture: Secondary | ICD-10-CM | POA: Insufficient documentation

## 2019-03-26 DIAGNOSIS — E039 Hypothyroidism, unspecified: Secondary | ICD-10-CM | POA: Insufficient documentation

## 2019-03-26 DIAGNOSIS — I480 Paroxysmal atrial fibrillation: Secondary | ICD-10-CM

## 2019-03-26 DIAGNOSIS — Z86010 Personal history of colonic polyps: Secondary | ICD-10-CM | POA: Diagnosis not present

## 2019-03-26 DIAGNOSIS — Z87891 Personal history of nicotine dependence: Secondary | ICD-10-CM | POA: Insufficient documentation

## 2019-03-26 DIAGNOSIS — I10 Essential (primary) hypertension: Secondary | ICD-10-CM | POA: Diagnosis not present

## 2019-03-26 DIAGNOSIS — F419 Anxiety disorder, unspecified: Secondary | ICD-10-CM | POA: Diagnosis not present

## 2019-03-26 DIAGNOSIS — C50411 Malignant neoplasm of upper-outer quadrant of right female breast: Secondary | ICD-10-CM | POA: Insufficient documentation

## 2019-03-26 DIAGNOSIS — E785 Hyperlipidemia, unspecified: Secondary | ICD-10-CM | POA: Insufficient documentation

## 2019-03-26 DIAGNOSIS — Z79811 Long term (current) use of aromatase inhibitors: Secondary | ICD-10-CM | POA: Diagnosis not present

## 2019-03-26 LAB — CMP (CANCER CENTER ONLY)
ALT: 17 U/L (ref 0–44)
AST: 17 U/L (ref 15–41)
Albumin: 3.9 g/dL (ref 3.5–5.0)
Alkaline Phosphatase: 77 U/L (ref 38–126)
Anion gap: 7 (ref 5–15)
BUN: 29 mg/dL — ABNORMAL HIGH (ref 8–23)
CO2: 32 mmol/L (ref 22–32)
Calcium: 9.6 mg/dL (ref 8.9–10.3)
Chloride: 102 mmol/L (ref 98–111)
Creatinine: 1.06 mg/dL — ABNORMAL HIGH (ref 0.44–1.00)
GFR, Est AFR Am: 56 mL/min — ABNORMAL LOW (ref >60)
GFR, Estimated: 48 mL/min — ABNORMAL LOW (ref >60)
Glucose, Bld: 117 mg/dL — ABNORMAL HIGH (ref 70–99)
Potassium: 4 mmol/L (ref 3.5–5.1)
Sodium: 141 mmol/L (ref 135–145)
Total Bilirubin: 0.3 mg/dL (ref 0.3–1.2)
Total Protein: 7.1 g/dL (ref 6.5–8.1)

## 2019-03-26 LAB — CBC WITH DIFFERENTIAL (CANCER CENTER ONLY)
Abs Immature Granulocytes: 0.03 10*3/uL (ref 0.00–0.07)
Basophils Absolute: 0.1 10*3/uL (ref 0.0–0.1)
Basophils Relative: 1 %
Eosinophils Absolute: 0.4 10*3/uL (ref 0.0–0.5)
Eosinophils Relative: 3 %
HCT: 41.7 % (ref 36.0–46.0)
Hemoglobin: 13.8 g/dL (ref 12.0–15.0)
Immature Granulocytes: 0 %
Lymphocytes Relative: 25 %
Lymphs Abs: 2.7 10*3/uL (ref 0.7–4.0)
MCH: 30.5 pg (ref 26.0–34.0)
MCHC: 33.1 g/dL (ref 30.0–36.0)
MCV: 92.3 fL (ref 80.0–100.0)
Monocytes Absolute: 0.9 10*3/uL (ref 0.1–1.0)
Monocytes Relative: 9 %
Neutro Abs: 6.7 10*3/uL (ref 1.7–7.7)
Neutrophils Relative %: 62 %
Platelet Count: 258 10*3/uL (ref 150–400)
RBC: 4.52 MIL/uL (ref 3.87–5.11)
RDW: 13.2 % (ref 11.5–15.5)
WBC Count: 10.8 10*3/uL — ABNORMAL HIGH (ref 4.0–10.5)
nRBC: 0 % (ref 0.0–0.2)

## 2019-03-26 MED ORDER — ANASTROZOLE 1 MG PO TABS: 1.0000 mg | ORAL_TABLET | Freq: Every day | ORAL | 4 refills | Status: DC

## 2019-03-26 NOTE — Progress Notes (Signed)
Location of Breast Cancer: Malignant neoplasm of upper outer quadrant of right female breast  Did patient present with symptoms (if so, please note symptoms) or was this found on screening mammography?: Routine Mammogram  Screening mammogram: 6 mm abnormality in the upper outer quadrant of the right breast.  Axilla looked negative.  Histology per Pathology Report: Right Breast 03/05/2019   Receptor Status: ER(+ 95%), PR (+ 95%), Her2-neu (-), Ki-67(10%)    Past/Anticipated interventions by surgeon, if any: Dr. Marlou Starks 03/19/2019 -The patient appears to have a small stage I cancer in the upper outer quadrant of the right breast. -I believe she would be a good candidate for breast conservation and she would favor this as well. -We will need cardiac clearance before surgery because she takes a blood thinner. -I will go ahead a refer her to medical and radiation oncology who will help [place] with adjuvant therapy. -Once we have clearance we will proceed with right breast radioactive seed localized lumpectomy. -No surgery date scheduled at this time.  Past/Anticipated interventions by medical oncology, if any: Chemotherapy  Dr. Jana Hakim 03/26/2019 4pm -Next we went over the options for systemic therapy which are anti-estrogens, anti-HER-2 immunotherapy, and chemotherapy. Nikya does not meet criteria for anti-HER-2 immunotherapy. She is a good candidate for anti-estrogens. -The question of chemotherapy is more complicated. Chemotherapy is most effective in rapidly growing, aggressive tumors. It is much less effective in low-grade, slow growing cancers, like Awilda 's.  For that reason and given the very low anatomic risk in this case as well as the patient's advanced age, no chemotherapy is planned. -Accordingly the plan is for lumpectomy to be followed by antiestrogen's.  We discussed the difference between tamoxifen and anastrozole in detail and she has a good understanding of the possible  toxicities side effects and complications of these agents. -She will be starting anastrozole 05/11/2019.   Lymphedema issues, if any:  No  Pain issues, if any:  No  SAFETY ISSUES:  Prior radiation? No  Pacemaker/ICD? No  Possible current pregnancy? Postmenopausal  Is the patient on methotrexate? No  Current Complaints / other details:      Cori Razor, RN 03/26/2019,12:00 PM

## 2019-03-27 ENCOUNTER — Encounter: Payer: Self-pay | Admitting: Radiation Oncology

## 2019-03-27 ENCOUNTER — Telehealth: Payer: Self-pay | Admitting: Adult Health

## 2019-03-27 ENCOUNTER — Ambulatory Visit
Admission: RE | Admit: 2019-03-27 | Discharge: 2019-03-27 | Disposition: A | Discharge status: Home / Self Care | Payer: Medicare Other | Source: Ambulatory Visit | Attending: Radiation Oncology | Admitting: Radiation Oncology

## 2019-03-27 ENCOUNTER — Other Ambulatory Visit: Payer: Self-pay

## 2019-03-27 VITALS — Ht 62.0 in | Wt 139.0 lb

## 2019-03-27 DIAGNOSIS — C50411 Malignant neoplasm of upper-outer quadrant of right female breast: Secondary | ICD-10-CM

## 2019-03-27 DIAGNOSIS — C50211 Malignant neoplasm of upper-inner quadrant of right female breast: Secondary | ICD-10-CM | POA: Diagnosis not present

## 2019-03-27 DIAGNOSIS — Z170 Estrogen receptor positive status [ER+]: Secondary | ICD-10-CM

## 2019-03-27 NOTE — Telephone Encounter (Signed)
I talk with patient regarding schedule  

## 2019-03-27 NOTE — Progress Notes (Signed)
Radiation Oncology         [phone] ________________________________  Initial Outpatient Consultation - Conducted via telephone due to current COVID-19 concerns for limiting patient exposure  I spoke with the patient to conduct this consult visit via telephone to spare the patient unnecessary potential exposure in the healthcare setting during the current COVID-19 pandemic. The patient was notified in advance and was offered a [place] meeting to allow for face to face communication but unfortunately reported that they did not have the appropriate resources/technology to support such a visit and instead preferred to proceed with a telephone consult.  ________________________________  Name: Cynthia Mathis        MRN: [id number]  Date of Service: 03/27/2019 DOB: 1933/04/09  CC:Fry, Ishmael Holter, MD  Cynthia Kussmaul, MD     REFERRING PHYSICIAN: Autumn Messing III, MD   DIAGNOSIS: The encounter diagnosis was Malignant neoplasm of upper-inner quadrant of right breast in female, estrogen receptor positive (Cynthia Mathis).   HISTORY OF PRESENT ILLNESS: Cynthia Mathis is a 85 y.o. female with a new diagnosis of right breast cancer. The patient was noted to have a screening detected abnormality in the right breast. She proceeded with diagnostic imaging that revealed a 6 mm lesion in the right breast at 11:00. Her axilla was negative for adenopathy. A biopsy on 03/05/2019 revealed a grade 1 invasive ductal carcinoma, and her tumor was ER/PR positive, HER2 negative with a Ki 67 of 10%. Her case was discussed in conference and was felt to be a good candidate for lumpectomy without sentinel node, and antiestrogen therapy in the adjuvant setting. She may be a candidate for adjuvant radiotherapy is is contacted by phone to discuss this. She is awaiting her lumpectomy to be scheduled.    PREVIOUS RADIATION THERAPY: No   PAST MEDICAL HISTORY:  Past Medical History:  Diagnosis Date  . Anxiety   . Borderline diabetes  mellitus   . Diverticulosis of colon   . DJD (degenerative joint disease)   . Dyslipidemia   . History of hematuria   . History of osteoporosis   . Hx of colonic polyps   . Hypertension   . Hypothyroid   . Macular degeneration    sees [name] for general eye care, sees [name] for retinal injections   . Memory loss   . Osteoporosis    took bisphosphonates, now off. Last DEXA in 2015 showed osteopenia   . Paroxysmal atrial fibrillation (HCC)    sees [name]        PAST SURGICAL HISTORY: Past Surgical History:  Procedure Laterality Date  . APPENDECTOMY  1947  . COLONOSCOPY  12/02/2014   per [name], severe diverticulosis but no polyps   . MASS EXCISION  04/17/2012   Procedure: EXCISION MASS;  Surgeon: [name], MD;  Location: [facility];  Service: General;  Laterality: N/A;  . THYROIDECTOMY  1990   [name]     FAMILY HISTORY:  Family History  Problem Relation Age of Onset  . Stroke Mother   . Colon cancer Neg Hx   . Stomach cancer Neg Hx      SOCIAL HISTORY:  reports that she quit smoking about 53 years ago. She has never used smokeless tobacco. She reports current alcohol use. She reports that she does not use drugs. The patient is originally from [place]. She is a retired IT sales professional. She is widowed and lives in [place].   ALLERGIES: Epinephrine   MEDICATIONS:  Current Outpatient Medications  Medication Sig Dispense Refill  . anastrozole (ARIMIDEX) 1 MG tablet Take 1 tablet (1 mg total) by mouth daily. 90 tablet 4  . atorvastatin (LIPITOR) 20 MG tablet Take 1 tablet (20 mg total) by mouth daily. 90 tablet 3  . Calcium Carbonate-Vitamin D (CALCIUM 600+D) 600-400 MG-UNIT per tablet Take 1 tablet by mouth daily.    . Cyanocobalamin (VITAMIN B 12 PO) Take 1 tablet by mouth daily. Reported on 12/27/2015    . donepezil (ARICEPT) 10 MG tablet Take 1 tablet (10 mg total) by mouth at bedtime. 90 tablet 3  . fish  oil-omega-3 fatty acids 1000 MG capsule Take 1 g by mouth daily.    [name] glucosamine-chondroitin 500-400 MG tablet Take 1 tablet by mouth 3 (three) times daily.    . hydrochlorothiazide (HYDRODIURIL) 25 MG tablet Take 1 tablet (25 mg total) by mouth daily. 90 tablet 3  . losartan (COZAAR) 100 MG tablet Take 1 tablet (100 mg total) by mouth daily. 90 tablet 3  . meclizine (ANTIVERT) 25 MG tablet Take 1 tablet (25 mg total) by mouth every 4 (four) hours as needed for dizziness. (Patient not taking: Reported on 03/26/2019) 60 tablet 1  . memantine (NAMENDA) 10 MG tablet Take 1 tablet (10 mg total) by mouth 2 (two) times daily. 180 tablet 3  . Multiple Vitamins-Minerals (MULTIVITAMIN & MINERAL PO) Take 1 tablet by mouth daily.    . verapamil (CALAN-SR) 240 MG CR tablet Take 1 tablet (240 mg total) by mouth at bedtime. Please make overdue appt with Dr. Meda Coffee before anymore refills. 1st attempt 90 tablet 3  . warfarin (COUMADIN) 5 MG tablet TAKE BY MOUTH AS DIRECTED BY COUMADIN CLINIC 90 tablet 2   No current facility-administered medications for this encounter.      REVIEW OF SYSTEMS: On review of systems, the patient reports that she is doing well overall. She denies any chest pain, shortness of breath, cough, fevers, chills, night sweats, unintended weight changes. She denies any bowel or bladder disturbances, and denies abdominal pain, nausea or vomiting. She denies any new musculoskeletal or joint aches or pains. A complete review of systems is obtained and is otherwise negative.     PHYSICAL EXAM:  Wt Readings from Last 3 Encounters:  03/26/19 139 lb 12.8 oz (63.4 kg)  02/12/19 139 lb 9.6 oz (63.3 kg)  09/20/18 140 lb 8 oz (63.7 kg)   Unable to assess due to encounter type.  ECOG = 0  0 - Asymptomatic (Fully active, able to carry on all predisease activities without restriction)  1 - Symptomatic but completely ambulatory (Restricted in physically strenuous activity but ambulatory and able  to carry out work of a light or sedentary nature. For example, light housework, office work)  2 - Symptomatic, <50% in bed during the day (Ambulatory and capable of all self care but unable to carry out any work activities. Up and about more than 50% of waking hours)  3 - Symptomatic, >50% in bed, but not bedbound (Capable of only limited self-care, confined to bed or chair 50% or more of waking hours)  4 - Bedbound (Completely disabled. Cannot carry on any self-care. Totally confined to bed or chair)  5 - Death   Eustace Pen MM, Creech RH, Tormey DC, et al. [phone]). "Toxicity and response criteria of the [facility]". [place] Oncol. 5 (6): 649-55    LABORATORY DATA:  Lab Results  Component Value Date   WBC  10.8 (H) 03/26/2019   HGB 13.8 03/26/2019   HCT 41.7 03/26/2019   MCV 92.3 03/26/2019   PLT 258 03/26/2019   Lab Results  Component Value Date   NA 141 03/26/2019   K 4.0 03/26/2019   CL 102 03/26/2019   CO2 32 03/26/2019   Lab Results  Component Value Date   ALT 17 03/26/2019   AST 17 03/26/2019   ALKPHOS 77 03/26/2019   BILITOT 0.3 03/26/2019      RADIOGRAPHY: No results found.     IMPRESSION/PLAN: 1. Stage IA, [id number] grade 1, ER PR positive invasive ductal carcinoma of the right breast. Dr. Lisbeth Renshaw discusses the pathology findings and reviews the nature of early breast disease. The consensus from the breast conference includes breast conservation with lumpectomy with antiestrogen therapy. She may benefit from radiotherapy if she had positive margins, a higher grade, or larger tumor. However, her case is quite favorable based on the data so far. We would likely be able to hold off on radiotherapy, but we did review the delivery and logistics of treatment as well as the utility. We discussed the risks, benefits, short, and long term effects of radiotherapy, and we will follow up with the results of surgery as well prior to making definitive plans.  The additional consideration would be to consider radiotherapy if she couldn't tolerated antiestrogen therapy.   Given current concerns for patient exposure during the COVID-19 pandemic, this encounter was conducted via telephone.  The patient has given verbal consent for this type of encounter. The time spent during this encounter was 30 minutes and 50% of that time was spent in the coordination of her care. The attendants for this meeting included Dr. Lisbeth Renshaw, Blenda Nicely, RN,  Shona Simpson, [facility] and Yasha Niebuhr  During the encounter, Dr. Lisbeth Renshaw, Blenda Nicely, RN, and Shona Simpson [facility] were located at [facility] Radiation Oncology Department.  Daphnie Ruegg  was located at home.  The above documentation reflects my direct findings during this shared patient visit. Please see the separate note by Dr. Lisbeth Renshaw on this date for the remainder of the patient's plan of care.    Carola Rhine, PAC

## 2019-03-28 ENCOUNTER — Ambulatory Visit: Payer: Medicare Other | Admitting: Cardiology

## 2019-03-28 ENCOUNTER — Encounter: Payer: Self-pay | Admitting: *Deleted

## 2019-03-31 ENCOUNTER — Ambulatory Visit (INDEPENDENT_AMBULATORY_CARE_PROVIDER_SITE_OTHER): Payer: Medicare Other | Admitting: Cardiology

## 2019-03-31 ENCOUNTER — Ambulatory Visit (INDEPENDENT_AMBULATORY_CARE_PROVIDER_SITE_OTHER): Payer: Medicare Other | Admitting: Diagnostic Neuroimaging

## 2019-03-31 ENCOUNTER — Encounter: Payer: Self-pay | Admitting: Diagnostic Neuroimaging

## 2019-03-31 ENCOUNTER — Other Ambulatory Visit: Payer: Self-pay

## 2019-03-31 ENCOUNTER — Encounter: Payer: Self-pay | Admitting: Cardiology

## 2019-03-31 VITALS — BP 148/82 | HR 63 | Ht 63.5 in | Wt 140.4 lb

## 2019-03-31 VITALS — BP 147/79 | HR 55 | Temp 98.1°F | Ht 63.25 in | Wt 140.0 lb

## 2019-03-31 DIAGNOSIS — F0390 Unspecified dementia without behavioral disturbance: Secondary | ICD-10-CM

## 2019-03-31 DIAGNOSIS — E785 Hyperlipidemia, unspecified: Secondary | ICD-10-CM | POA: Diagnosis not present

## 2019-03-31 DIAGNOSIS — I480 Paroxysmal atrial fibrillation: Secondary | ICD-10-CM | POA: Diagnosis not present

## 2019-03-31 DIAGNOSIS — I10 Essential (primary) hypertension: Secondary | ICD-10-CM | POA: Diagnosis not present

## 2019-03-31 DIAGNOSIS — F03A0 Unspecified dementia, mild, without behavioral disturbance, psychotic disturbance, mood disturbance, and anxiety: Secondary | ICD-10-CM

## 2019-03-31 NOTE — Progress Notes (Addendum)
Cardiology Office Note    Date:  03/31/2019   ID:  Cynthia Mathis, DOB [birth year], MRN [id number]  PCP:  Cynthia Morale, MD  Cardiologist:  Cynthia Dawley, MD  Referring physician: Noralee Space, MD Chief complain: Follow up for new dg of PAF  History of Present Illness:  Cynthia Mathis is a 85 y.o. female with h/o DM, hyperlipidemia, hypertension who presented to the ER on 06/29/2016 with palpitations and was diagnosed with new onset atrial fibrillation with RVR - ventricular rate 140 BPM. The patient was symptomatic with dizziness and felt the onset of atrial fibrillation. While in the ER she spontaneously cardioverted into the SR and was discharged home. CHADVASC score: 5. She was started on Eliquis and reports no bleeding. No palpitations since then.   11/24/2016 - the patient is coming after 3 months, since the last visit she hasn't experienced any chest pain shortness of breath no dizziness no palpitation presyncope or syncope. She denies any bleeding with warfarin and no muscle pain with Lipitor. She has been experiencing abdominal pains and diarrhea as and might possibly need a colonoscopy.  01/21/2018 -the patient is coming after 6 months, she feels well, denies any palpitation dizziness or syncope.  She continues to play pickle ball 1-2 hours a day and denies any chest pain or shortness of breath.  No lower extremity edema no claudications.  She is compliant with her medication and has no side effects with atorvastatin and no bleeding with warfarin.  03/31/2019 -the patient is coming after 1 year, she seems depressed, her husband passed in December unexpectedly at home seconds of heart attack.  She has been unable to play her favorite pickle ball because of knee pain.  She denies any chest pain shortness of breath no palpitation, no lower extremity edema.  Past Medical History:  Diagnosis Date  . Anxiety   . Borderline diabetes mellitus   . Diverticulosis of colon   . DJD  (degenerative joint disease)   . Dyslipidemia   . History of hematuria   . History of osteoporosis   . Hx of colonic polyps   . Hypertension   . Hypothyroid   . Macular degeneration    sees Dr. Herbert Mathis for general eye care, sees Dr. Zadie Mathis for retinal injections   . Memory loss   . Osteoporosis    took bisphosphonates, now off. Last DEXA in 2015 showed osteopenia   . Paroxysmal atrial fibrillation (HCC)    sees Dr. Ena Mathis     Past Surgical History:  Procedure Laterality Date  . APPENDECTOMY  1947  . COLONOSCOPY  12/02/2014   per [name], severe diverticulosis but no polyps   . MASS EXCISION  04/17/2012   Procedure: EXCISION MASS;  Surgeon: Cynthia Faster. Cornett, MD;  Location: [facility];  Service: General;  Laterality: N/A;  . THYROIDECTOMY  1990   Dr. Rise Mathis   Current Medications: Outpatient Medications Prior to Visit  Medication Sig Dispense Refill  . atorvastatin (LIPITOR) 20 MG tablet Take 1 tablet (20 mg total) by mouth daily. 90 tablet 3  . Calcium Carbonate-Vitamin D (CALCIUM 600+D) 600-400 MG-UNIT per tablet Take 1 tablet by mouth daily.    . Cyanocobalamin (VITAMIN B 12 PO) Take 1 tablet by mouth daily. Reported on 12/27/2015    . donepezil (ARICEPT) 10 MG tablet Take 1 tablet (10 mg total) by mouth at bedtime. 90 tablet 3  . fish oil-omega-3 fatty acids 1000 MG capsule Take 1 g  by mouth daily.    [name] glucosamine-chondroitin 500-400 MG tablet Take 1 tablet by mouth 3 (three) times daily.    . hydrochlorothiazide (HYDRODIURIL) 25 MG tablet Take 1 tablet (25 mg total) by mouth daily. 90 tablet 3  . losartan (COZAAR) 100 MG tablet Take 1 tablet (100 mg total) by mouth daily. 90 tablet 3  . memantine (NAMENDA) 10 MG tablet Take 1 tablet (10 mg total) by mouth 2 (two) times daily. 180 tablet 3  . Multiple Vitamins-Minerals (MULTIVITAMIN & MINERAL PO) Take 1 tablet by mouth daily.    . verapamil (CALAN-SR) 240 MG CR tablet Take 1 tablet (240 mg total) by  mouth at bedtime. Please make overdue appt with Dr. Meda Mathis before anymore refills. 1st attempt 90 tablet 3  . warfarin (COUMADIN) 5 MG tablet TAKE BY MOUTH AS DIRECTED BY COUMADIN CLINIC 90 tablet 2  . anastrozole (ARIMIDEX) 1 MG tablet Take 1 tablet (1 mg total) by mouth daily. (Patient not taking: Reported on 03/31/2019) 90 tablet 4  . meclizine (ANTIVERT) 25 MG tablet Take 1 tablet (25 mg total) by mouth every 4 (four) hours as needed for dizziness. (Patient not taking: Reported on 03/27/2019) 60 tablet 1   No facility-administered medications prior to visit.      Allergies:   Epinephrine   Social History   Socioeconomic History  . Marital status: Married    Spouse name: Cynthia Mathis  . Number of children: 3  . Years of education: [employer]  . Highest education level: Not on file  Occupational History  . Occupation: Education officer, environmental: RETIRED  Social Needs  . Financial resource strain: Not on file  . Food insecurity    Worry: Not on file    Inability: Not on file  . Transportation needs    Medical: No    Non-medical: No  Tobacco Use  . Smoking status: Former Smoker    Quit date: 07/10/1965    Years since quitting: 53.7  . Smokeless tobacco: Never Used  . Tobacco comment: 3 cigs per week x 3 years (started in 1964), 12/27/15 does not smoke  Substance and Sexual Activity  . Alcohol use: Yes    Alcohol/week: 0.0 standard drinks    Comment: occasionally  . Drug use: No  . Sexual activity: Not on file  Lifestyle  . Physical activity    Days per week: Not on file    Minutes per session: Not on file  . Stress: Not on file  Relationships  . Social Herbalist on phone: Not on file    Gets together: Not on file    Attends religious service: Not on file    Active member of club or organization: Not on file    Attends meetings of clubs or organizations: Not on file    Relationship status: Not on file  Other Topics Concern  . Not on file  Social History  Narrative   Pt lives at home with her spouse Cynthia Mathis, who has been dx'd with mild cognitive impairment vs. mild dementia, former pt of [name]). She does not use caffeine, if so, rarely.     Family History:  The patient's family history includes Stroke in her mother.   ROS:   Please see the history of present illness.    ROS All other systems reviewed and are negative.   PHYSICAL EXAM:   VS:  BP (!) 148/82   Pulse 63   Ht  5' 3.5" (1.613 m)   Wt 140 lb 6.4 oz (63.7 kg)   SpO2 99%   BMI 24.48 kg/m    GEN: Well nourished, well developed, in no acute distress  HEENT: normal  Neck: no JVD, carotid bruits, or masses Cardiac: RRR; no murmurs, rubs, or gallops,no edema, poor peripheral pulses Respiratory:  clear to auscultation bilaterally, normal work of breathing GI: soft, nontender, nondistended, + BS MS: no deformity or atrophy  Skin: warm and dry, no rash Neuro:  Alert and Oriented x 3, Strength and sensation are intact Psych: euthymic mood, full affect  Wt Readings from Last 3 Encounters:  03/31/19 140 lb 6.4 oz (63.7 kg)  03/27/19 139 lb (63 kg)  03/26/19 139 lb 12.8 oz (63.4 kg)    Studies/Labs Reviewed:   EKG: Is not ordered today  Recent Labs: 02/12/2019: TSH 0.32 03/26/2019: ALT 17; BUN 29; Creatinine 1.06; Hemoglobin 13.8; Platelet Count 258; Potassium 4.0; Sodium 141   Lipid Panel    Component Value Date/Time   CHOL 196 02/05/2019 0852   TRIG 186 (H) 02/05/2019 0852   HDL 50 02/05/2019 0852   CHOLHDL 3.9 02/05/2019 0852   CHOLHDL 4 02/13/2017 0905   VLDL 31.4 02/13/2017 0905   LDLCALC 109 (H) 02/05/2019 0852   Additional studies/ records that were reviewed today include:   TTE: 09/2016 - Left ventricle: The cavity size was normal. There was moderate   focal basal hypertrophy of the septum with mild posterior wall   hypertrophy. Systolic function was normal. The estimated ejection   fraction was in the range of 60% to 65%. Wall motion was normal;    there were no regional wall motion abnormalities. Doppler   parameters are consistent with abnormal left ventricular   relaxation (grade 1 diastolic dysfunction). Doppler parameters   are consistent with indeterminate ventricular filling pressure. - Aortic valve: Transvalvular velocity was within the normal range.   There was no stenosis. There was mild regurgitation. - Right ventricle: The cavity size was normal. Wall thickness was   normal. Systolic function was normal. - Atrial septum: No defect or patent foramen ovale was identified   by color flow Doppler. - Tricuspid valve: There was mild regurgitation. - Pulmonary arteries: Systolic pressure was within the normal   range. PA peak pressure: 33 mm Hg (S).    ASSESSMENT:    1. Hyperlipidemia, unspecified hyperlipidemia type   2. Paroxysmal atrial fibrillation (HCC)   3. Essential hypertension    PLAN:  In order of problems listed above:  1. Proximal atrial fibrillation  -no recent episode, no palpitations, no bleeding on Coumadin, hemoglobin 13.8 on January 07, 2018.   2. Hypertension  -she is advised to watch low-sodium diet increase liquids and her diet and start daily walking. 3. Hyperlipidemia  -LDL at goal however elevated triglycerides, she is advised on limiting intake of sweets that she like.  Continue fish oil. 4. Preoperative evaluation for breast surgery - the patient is considered a low risk for intermediate risk surgery. She has no signs of angina or CHF and can achieve 4 METS. Coumadin clinic should assist with coumadin management prior and post surgery. No need for bridging.  5. Follow-up in 1 year.   Medication Adjustments/Labs and Tests Ordered: Current medicines are reviewed at length with the patient today.  Concerns regarding medicines are outlined above.  Medication changes, Labs and Tests ordered today are listed in the Patient Instructions below. Patient Instructions  Medication Instructions:   Your  physician recommends that you continue on your current medications as directed. Please refer to the Current Medication list given to you today.  If you need a refill on your cardiac medications before your next appointment, please call your pharmacy.     Follow-Up: At [facility], you and your health needs are our priority.  As part of our continuing mission to provide you with exceptional heart care, we have created designated Provider Care Teams.  These Care Teams include your primary Cardiologist (physician) and Advanced Practice Providers (APPs -  Physician Assistants and Nurse Practitioners) who all work together to provide you with the care you need, when you need it. You will need a follow up appointment in 6 months.  Please call our office 2 months in advance to schedule this appointment.  You may see Cynthia Dawley, MD or one of the following Advanced Practice Providers on your designated Care Team:   [place], PA-C [name], PA-C . Ermalinda Barrios, PA-C        Signed, Cynthia Dawley, MD  03/31/2019 10:42 AM    [facility] [address], [place], [place]  [address] Phone: [phone]; Fax: [phone]

## 2019-03-31 NOTE — Patient Instructions (Addendum)
Medication Instructions:   STOP TAKING ATORVASTATIN NOW  If you need a refill on your cardiac medications before your next appointment, please call your pharmacy.     Follow-Up: At [facility], you and your health needs are our priority.  As part of our continuing mission to provide you with exceptional heart care, we have created designated Provider Care Teams.  These Care Teams include your primary Cardiologist (physician) and Advanced Practice Providers (APPs -  Physician Assistants and Nurse Practitioners) who all work together to provide you with the care you need, when you need it. You will need a follow up appointment in 6 months.  Please call our office 2 months in advance to schedule this appointment.  You may see Ena Dawley, MD or one of the following Advanced Practice Providers on your designated Care Team:   [place], PA-C [name], PA-C . Ermalinda Barrios, PA-C

## 2019-03-31 NOTE — Progress Notes (Signed)
[facility]  PATIENT: Cynthia Mathis DOB: Dec 22, 1932  REFERRING CLINICIAN: Nadel HISTORY FROM: patient and son REASON FOR VISIT: follow up   HISTORICAL  CHIEF COMPLAINT:  Chief Complaint  Patient presents with   MCI    rm 7 son,- [name], MMSE 25    HISTORY OF PRESENT ILLNESS:   UPDATE (03/31/19, VRP): Since last visit, doing slightly worse. More memory loss and confusion. Still living alone. Symptoms are progressive. Severity is mild - moderate. No alleviating or aggravating factors. Tolerating meds.    UPDATE 12/27/15: Since last visit, doing well. Memory issues are stable. Tolerating meds (donepezil and memantine) but takes memantine only 10mg  at bedtime; day time dose caused "fogginess".   UPDATE 01/04/15: Since last visit, started memantine 10mg  BID, could not tolerate, so started taking it daily. No new events. Memory stable.   UPDATE 07/01/14: Since last visit, memory loss has progressed. Now on donepezil 10mg  qhs per [name]. Tolerating without side effects.  UPDATE 04/23/13: Since last visit patient continues to have short-term memory problems. This is when she has conversations with her husband and then cannot remember the details later. She also has some difficulty remembering peoples names after meeting them. Other people have not noticed any significant memory problems. Patient notices these problems primarily herself. Patient denies any significant depression. He does have some anxiety feelings with tense turning feeling in her stomach especially if her husband gets upset with her.  PRIOR HPI (10/07/12): 85 year old right-handed female here for evaluation of memory loss. Patient accompanied by her husband. For past one year patient is having increasing trouble with forgetting things. She's having to rely on taking notes, maintaining a calendar, using lists to remember things. Patient accompanied by her husband, who also has been diagnosed with mild  cognitive impairment versus mild dementia by [name]. He takes galantamine, which he says has helped his memory. Other examples include forgetting recent conversations, recent events, repeating questions over and over. Unfortunately the only informant of this problem is the patient's husband, who also happens to have mild cognitive impairment or dementia. No other family members or friends have noticed this problem in the patient. Otherwise patient is doing well still able to maintain activities of daily living including cooking, cleaning, driving, shopping.   REVIEW OF SYSTEMS: Full 14 system review of systems performed and negative except: memory loss hearing loss light sens heat intolerance loss of vision.    ALLERGIES: Allergies  Allergen Reactions   Epinephrine Palpitations    REACTION: heart racing    HOME MEDICATIONS: Outpatient Medications Prior to Visit  Medication Sig Dispense Refill   atorvastatin (LIPITOR) 20 MG tablet Take 1 tablet (20 mg total) by mouth daily. 90 tablet 3   Calcium Carbonate-Vitamin D (CALCIUM 600+D) 600-400 MG-UNIT per tablet Take 1 tablet by mouth daily.     Cyanocobalamin (VITAMIN B 12 PO) Take 1 tablet by mouth daily. Reported on 12/27/2015     donepezil (ARICEPT) 10 MG tablet Take 1 tablet (10 mg total) by mouth at bedtime. 90 tablet 3   fish oil-omega-3 fatty acids 1000 MG capsule Take 1 g by mouth daily.     glucosamine-chondroitin 500-400 MG tablet Take 1 tablet by mouth 3 (three) times daily.     hydrochlorothiazide (HYDRODIURIL) 25 MG tablet Take 1 tablet (25 mg total) by mouth daily. 90 tablet 3   losartan (COZAAR) 100 MG tablet Take 1 tablet (100 mg total) by mouth daily. 90 tablet 3  memantine (NAMENDA) 10 MG tablet Take 1 tablet (10 mg total) by mouth 2 (two) times daily. 180 tablet 3   Multiple Vitamins-Minerals (MULTIVITAMIN & MINERAL PO) Take 1 tablet by mouth daily.     verapamil (CALAN-SR) 240 MG CR tablet Take 1 tablet (240 mg  total) by mouth at bedtime. Please make overdue appt with Dr. Meda Coffee before anymore refills. 1st attempt 90 tablet 3   warfarin (COUMADIN) 5 MG tablet TAKE BY MOUTH AS DIRECTED BY COUMADIN CLINIC 90 tablet 2   No facility-administered medications prior to visit.     PAST MEDICAL HISTORY: Past Medical History:  Diagnosis Date   Anxiety    Borderline diabetes mellitus    Cancer ([place]) 2020   breast   Diverticulosis of colon    DJD (degenerative joint disease)    Dyslipidemia    History of hematuria    History of osteoporosis    Hx of colonic polyps    Hypertension    Hypothyroid    Macular degeneration    sees [name] for general eye care, sees [name] for retinal injections    Memory loss    Osteoporosis    took bisphosphonates, now off. Last DEXA in 2015 showed osteopenia    Paroxysmal atrial fibrillation ([place])    sees [name]     PAST SURGICAL HISTORY: Past Surgical History:  Procedure Laterality Date   APPENDECTOMY  1947   COLONOSCOPY  12/02/2014   per [name], severe diverticulosis but no polyps    MASS EXCISION  04/17/2012   Procedure: EXCISION MASS;  Surgeon: [name], MD;  Location: [facility];  Service: General;  Laterality: N/A;   THYROIDECTOMY  1990   [name]    FAMILY HISTORY: Family History  Problem Relation Age of Onset   Stroke Mother    Colon cancer Neg Hx    Stomach cancer Neg Hx     SOCIAL HISTORY:  Social History   Socioeconomic History   Marital status: Widowed    Spouse name: Mariane Masters Freund   Number of children: 3   Years of education: High School   Highest education level: Not on file  Occupational History   Occupation: Education officer, environmental: RETIRED  Social Designer, fashion/clothing strain: Not on file   Food insecurity    Worry: Not on file    Inability: Not on file   Transportation needs    Medical: No    Non-medical: No  Tobacco Use   Smoking  status: Former Smoker    Quit date: 07/10/1965    Years since quitting: 53.7   Smokeless tobacco: Never Used   Tobacco comment: 3 cigs per week x 3 years (started in 1964), 12/27/15 does not smoke  Substance and Sexual Activity   Alcohol use: Yes    Alcohol/week: 0.0 standard drinks    Comment: occasionally   Drug use: No   Sexual activity: Not on file  Lifestyle   Physical activity    Days per week: Not on file    Minutes per session: Not on file   Stress: Not on file  Relationships   Social connections    Talks on phone: Not on file    Gets together: Not on file    Attends religious service: Not on file    Active member of club or organization: Not on file    Attends meetings of clubs or organizations: Not on file  Relationship status: Not on file   Intimate partner violence    Fear of current or ex partner: Not on file    Emotionally abused: Not on file    Physically abused: Not on file    Forced sexual activity: Not on file  Other Topics Concern   Not on file  Social History Narrative   03/26/19 Pt lives at home alone now, widow.   her spouse passed Dec 2019 Mariane Masters Durden, who has been dx'd with mild cognitive impairment vs. mild dementia, former pt of [name]). She does not use caffeine, if so, rarely.     PHYSICAL EXAM  Vitals:   03/31/19 1440  BP: (!) 147/79  Pulse: (!) 55  Temp: 98.1 F (36.7 C)  Weight: 140 lb (63.5 kg)  Height: 5' 3.25" (1.607 m)   Body mass index is 24.6 kg/m.  MMSE - Mini Mental State Exam 03/31/2019 03/28/2018 03/27/2017  Orientation to time 3 4 1   Orientation to Place 5 4 5   Registration 3 3 3   Attention/ Calculation 1 4 5   Recall 2 2 1   Language- name 2 objects 2 2 2   Language- repeat 1 1 1   Language- follow 3 step command 3 3 3   Language- read & follow direction 1 1 1   Write a sentence 1 1 1   Copy design 1 1 1   Total score 23 26 24      Montreal Cognitive Assessment  07/01/2014  Visuospatial/ Executive (0/5) 3    Naming (0/3) 3  Attention: Read list of digits (0/2) 2  Attention: Read list of letters (0/1) 1  Attention: Serial 7 subtraction starting at 100 (0/3) 3  Language: Repeat phrase (0/2) 1  Language : Fluency (0/1) 1  Abstraction (0/2) 2  Delayed Recall (0/5) 0  Orientation (0/6) 4  Total 20  Adjusted Score (based on education) 21    GENERAL EXAM: Patient is in no distress  CARDIOVASCULAR: Regular rate and rhythm, no murmurs, no carotid bruits  NEUROLOGIC: MENTAL STATUS: awake, alert, language fluent, comprehension intact, naming intact CRANIAL NERVE: pupils equal and reactive to light, visual fields full to confrontation, extraocular muscles intact, no nystagmus, facial sensation and strength symmetric, uvula midline, shoulder shrug symmetric, tongue midline. MOTOR: normal bulk and tone, full strength in the BUE, BLE SENSORY: normal and symmetric to light touch, temperature, vibration. COORDINATION: finger-nose-finger, fine finger movements normal REFLEXES: deep tendon reflexes present and symmetric GAIT/STATION: narrow based gait; able to walk tandem; romberg is negative    DIAGNOSTIC DATA (LABS, IMAGING, TESTING) - I reviewed patient records, labs, notes, testing and imaging myself where available.  Lab Results  Component Value Date   WBC 10.8 (H) 03/26/2019   HGB 13.8 03/26/2019   HCT 41.7 03/26/2019   MCV 92.3 03/26/2019   PLT 258 03/26/2019      Component Value Date/Time   NA 141 03/26/2019 1521   NA 146 (H) 02/05/2019 0852   K 4.0 03/26/2019 1521   CL 102 03/26/2019 1521   CO2 32 03/26/2019 1521   GLUCOSE 117 (H) 03/26/2019 1521   BUN 29 (H) 03/26/2019 1521   BUN 18 02/05/2019 0852   CREATININE 1.06 (H) 03/26/2019 1521   CALCIUM 9.6 03/26/2019 1521   PROT 7.1 03/26/2019 1521   PROT 7.0 02/05/2019 0852   ALBUMIN 3.9 03/26/2019 1521   ALBUMIN 4.4 02/05/2019 0852   AST 17 03/26/2019 1521   ALT 17 03/26/2019 1521   ALKPHOS 77 03/26/2019 1521   BILITOT 0.3  03/26/2019 1521   GFRNONAA 48 (L) 03/26/2019 1521   GFRAA 56 (L) 03/26/2019 1521   Lab Results  Component Value Date   CHOL 196 02/05/2019   HDL 50 02/05/2019   LDLCALC 109 (H) 02/05/2019   TRIG 186 (H) 02/05/2019   CHOLHDL 3.9 02/05/2019   Lab Results  Component Value Date   HGBA1C 6.6 (H) 02/12/2019   Lab Results  Component Value Date   VITAMINB12 819 10/07/2012   Lab Results  Component Value Date   TSH 0.32 (L) 02/12/2019    10/16/12 MRI brain - mild atrophy and mild chronic small vessel ischemic disease.   ASSESSMENT AND PLAN  85 y.o. year old female  has a past medical history of Anxiety, Borderline diabetes mellitus, Cancer ([place]) (2020), Diverticulosis of colon, DJD (degenerative joint disease), Dyslipidemia, History of hematuria, History of osteoporosis, colonic polyps, Hypertension, Hypothyroid, Macular degeneration, Memory loss, Osteoporosis, and Paroxysmal atrial fibrillation ([place]). here with history of short-term memory problems since March 2012. Cognitive ability has slightly worsened since 2014. Some change in ADLs.    Dx: mild cognitive impairment vs mild dementia  Mild dementia ([place])    PLAN:  MILD DEMENTIA - continue memantine + donepezil - safety, supervision, planning issues reviewed - caution with living alone and driving; recommend to transition to more supervision  Return for return to PCP, pending if symptoms worsen or fail to improve.     Penni Bombard, MD [id number], [id number] PM Certified in Neurology, Neurophysiology and Neuroimaging  [facility] [address], [place], [place] [address] [phone]

## 2019-04-01 ENCOUNTER — Telehealth: Payer: Self-pay | Admitting: *Deleted

## 2019-04-01 ENCOUNTER — Telehealth: Payer: Self-pay | Admitting: Cardiology

## 2019-04-01 NOTE — Telephone Encounter (Signed)
Dr. Meda Coffee, you saw this pt yesterday in clinic and there was to be a clearance routed to you to advise on pre-operative cardiovascular risk, for upcoming breast cancer surgery.  I will route the clearance note where she has already been cleared with coumadin, but you will need to advise pre-op cardiovascular risk.

## 2019-04-01 NOTE — Telephone Encounter (Signed)
   Primary Cardiologist: Ena Dawley, MD  Chart reviewed as part of pre-operative protocol coverage. Given past medical history and time since last visit, based on ACC/AHA guidelines, Josselyne Pries would be at acceptable risk for the planned procedure without further cardiovascular testing.   I will route this recommendation to the requesting party via Epic fax function and remove from pre-op pool.  Please call with questions.  Per Dr. Meda Coffee: Preoperative evaluation for breast surgery - the patient is considered a low risk for intermediate risk surgery. She has no signs of angina or CHF and can achieve 4 METS. Coumadin clinic should assist with coumadin management prior and post surgery. No need for bridging.  Case reviewed by our clinical pharmacist, ok to hold coumadin for 5 days prior to the procedure. Patient will NOT need bridging with lovenox  Almyra Deforest, PA 04/01/2019, 4:04 PM

## 2019-04-01 NOTE — Telephone Encounter (Signed)
Just talked to her daughter.  Call her at [phone]. I sent you the clearance note to type in and then send it back to me to send it back to pre-op pool.

## 2019-04-01 NOTE — Telephone Encounter (Signed)
PLAN:  In order of problems listed above:  1. Proximal atrial fibrillation  -no recent episode, no palpitations, no bleeding on Coumadin, hemoglobin 13.8 on January 07, 2018.   2. Hypertension  -she is advised to watch low-sodium diet increase liquids and her diet and start daily walking. 3. Hyperlipidemia  -LDL at goal however elevated triglycerides, she is advised on limiting intake of sweets that she like.  Continue fish oil. 4. Preoperative evaluation for breast surgery - the patient is considered a low risk for intermediate risk surgery. She has no signs of angina or CHF and can achieve 4 METS. Coumadin clinic should assist with coumadin management prior and post surgery. No need for bridging.  5. Follow-up in 1 year.    Signed, Ena Dawley, MD  03/31/2019 10:42 AM    [facility] [address], [place], [place]  [address] Phone: [phone]; Fax: [phone]

## 2019-04-01 NOTE — Addendum Note (Signed)
Addended by: Nuala Alpha on: 04/01/2019 12:26 PM   Modules accepted: Orders

## 2019-04-01 NOTE — Telephone Encounter (Signed)
   [facility] Pre-operative Risk Assessment    Request for surgical clearance:  1. What type of surgery is being performed? BREAST LUMPECTOMY OF RIGHT BREAST   2. When is this surgery scheduled? TBD   3. What type of clearance is required (medical clearance vs. Pharmacy clearance to hold med vs. Both)? BOTH  4. Are there any medications that need to be held prior to surgery and how long? WAFARIN X 5 DAYS PRIOR  5. Practice name and name of physician performing surgery? [facility]; DR. PAUL TOTH   6. What is your office phone number [phone]    7.   What is your office fax number [phone]  8.   Anesthesia type (None, local, MAC, general) ? GENERAL   Cynthia Mathis 04/01/2019, 11:57 AM  _________________________________________________________________   (provider comments below)

## 2019-04-01 NOTE — Telephone Encounter (Signed)
Patient with diagnosis of afib on warfarin for anticoagulation.    Procedure:  BREAST LUMPECTOMY OF RIGHT BREAST  Date of procedure: TBD  CHADS2-VASc score of  5 (HTN, AGE, DM2, AGE, female)  Per office protocol, patient can hold warfarin for 5 days prior to procedure.    Patient will NOT need bridging with Lovenox (enoxaparin) around procedure.

## 2019-04-01 NOTE — Telephone Encounter (Signed)
I will do addendum to her note, also she wanted me to call her daughter, I tried twice - different times, she is not answering her phone

## 2019-04-01 NOTE — Telephone Encounter (Signed)
Thank you. I have type up a formal clearance letter and forwarded to Dr. Ethlyn Gallery office

## 2019-04-01 NOTE — Telephone Encounter (Signed)
New Message   Patients daughter received a phone call she doesn't know from whom and there is no notes in the chart.  Please call her back to discuss patient's appointment from 03/31/19.

## 2019-04-08 ENCOUNTER — Encounter: Payer: Self-pay | Admitting: *Deleted

## 2019-04-09 ENCOUNTER — Ambulatory Visit (INDEPENDENT_AMBULATORY_CARE_PROVIDER_SITE_OTHER): Payer: Medicare Other

## 2019-04-09 ENCOUNTER — Other Ambulatory Visit: Payer: Self-pay

## 2019-04-09 DIAGNOSIS — I480 Paroxysmal atrial fibrillation: Secondary | ICD-10-CM | POA: Diagnosis not present

## 2019-04-09 DIAGNOSIS — Z5181 Encounter for therapeutic drug level monitoring: Secondary | ICD-10-CM | POA: Diagnosis not present

## 2019-04-09 LAB — POCT INR: INR: 2.5 (ref 2.0–3.0)

## 2019-04-09 NOTE — Patient Instructions (Signed)
Description   Continue on same dosage 1/2 tablet every day. Recheck INR in 4 weeks. Call with any medication changes or if scheduled for any procedures [phone].

## 2019-04-24 ENCOUNTER — Other Ambulatory Visit: Payer: Medicare Other

## 2019-04-24 ENCOUNTER — Telehealth (INDEPENDENT_AMBULATORY_CARE_PROVIDER_SITE_OTHER): Payer: Medicare Other | Admitting: Adult Health

## 2019-04-24 ENCOUNTER — Ambulatory Visit (INDEPENDENT_AMBULATORY_CARE_PROVIDER_SITE_OTHER): Payer: Medicare Other

## 2019-04-24 ENCOUNTER — Other Ambulatory Visit: Payer: Self-pay

## 2019-04-24 DIAGNOSIS — T189XXA Foreign body of alimentary tract, part unspecified, initial encounter: Secondary | ICD-10-CM

## 2019-04-24 NOTE — Progress Notes (Signed)
Virtual Visit via Telephone Note  I connected with Cynthia Mathis on 04/24/19 at 10:00 AM EDT by telephone and verified that I am speaking with the correct person using two identifiers.   I discussed the limitations, risks, security and privacy concerns of performing an evaluation and management service by telephone and the availability of in person appointments. I also discussed with the patient that there may be a patient responsible charge related to this service. The patient expressed understanding and agreed to proceed.  Location patient: home Location provider: work or home office Participants present for the call: patient, provider Patient did not have a visit in the prior 7 days to address this/these issue(s).   History of Present Illness: 85 year old female who  has a past medical history of Anxiety, Borderline diabetes mellitus, Cancer ([place]) (2020), Diverticulosis of colon, DJD (degenerative joint disease), Dyslipidemia, History of hematuria, History of osteoporosis, colonic polyps, Hypertension, Hypothyroid, Macular degeneration, Memory loss, Osteoporosis, and Paroxysmal atrial fibrillation ([place]).  She is being evaluated today for an acute issue.  She reports that 9 days ago she swallowed an earring.  She states "the earring was on a small dish with my pills that I take in the evening, I scooped them all up and swallowed them, realizing that I also swallowed an earring.  She also reports that she has been "digging through my poop looking for the earring" but has not found the earring yet.  She denies abdominal pain or blood in her stool.   Observations/Objective: Patient sounds cheerful and well on the phone. I do not appreciate any SOB. Speech and thought processing are grossly intact. Patient reported vitals:  Assessment and Plan: 1. Swallowed foreign body, initial encounter -I will have her come in this afternoon for x-ray of abdomen.  Likely the earring has been passed  already - DG Abd 1 View; Future   Follow Up Instructions:   I did not refer this patient for an OV in the next 24 hours for this/these issue(s).  I discussed the assessment and treatment plan with the patient. The patient was provided an opportunity to ask questions and all were answered. The patient agreed with the plan and demonstrated an understanding of the instructions.   The patient was advised to call back or seek an in-person evaluation if the symptoms worsen or if the condition fails to improve as anticipated.  I provided 15 minutes of non-face-to-face time during this encounter.   Dorothyann Peng, NP

## 2019-04-28 ENCOUNTER — Telehealth: Payer: Self-pay | Admitting: *Deleted

## 2019-04-28 NOTE — Telephone Encounter (Signed)
Copied from [place] [id number]. Topic: General - Inquiry >> Apr 25, 2019  4:10 PM Mathis Bud wrote: Reason for CRM: Patient son (steven) called regarding patients xrays.  Patient and son would like to get results before weekend due to the reason she had xray is because she swallowed a ear ring    Call back [phone]

## 2019-04-28 NOTE — Telephone Encounter (Signed)
Pt Son notified of update. No further action needed!

## 2019-04-29 ENCOUNTER — Other Ambulatory Visit: Payer: Self-pay

## 2019-04-29 DIAGNOSIS — H353211 Exudative age-related macular degeneration, right eye, with active choroidal neovascularization: Secondary | ICD-10-CM | POA: Diagnosis not present

## 2019-04-29 DIAGNOSIS — H35351 Cystoid macular degeneration, right eye: Secondary | ICD-10-CM | POA: Diagnosis not present

## 2019-04-29 DIAGNOSIS — H353133 Nonexudative age-related macular degeneration, bilateral, advanced atrophic without subfoveal involvement: Secondary | ICD-10-CM | POA: Diagnosis not present

## 2019-04-29 NOTE — Patient Outreach (Signed)
[facility]) Care Management  04/29/2019  Charmayne Mcelhiney June 11, 1933 [id number]   Medication Adherence call to Mrs. Debborah [place] Hippa Identifiers Verify spoke with patients son pateint is past due on Atorvastatin 20 mg and Losartan 100 mg patient explain she is no longer taking Atorvastatin doctor took her off  And on Losartan patients son explain he was going to check on her prescription and if she needs it he will call in the refill. Mrs. Walrond is showing past due under [place].   [facility] Management Direct Dial [phone]  Fax [phone] [email or web address]

## 2019-05-06 ENCOUNTER — Other Ambulatory Visit (HOSPITAL_COMMUNITY): Payer: Medicare Other

## 2019-05-06 DIAGNOSIS — H353211 Exudative age-related macular degeneration, right eye, with active choroidal neovascularization: Secondary | ICD-10-CM | POA: Diagnosis not present

## 2019-05-06 DIAGNOSIS — H353221 Exudative age-related macular degeneration, left eye, with active choroidal neovascularization: Secondary | ICD-10-CM | POA: Diagnosis not present

## 2019-05-06 DIAGNOSIS — H353133 Nonexudative age-related macular degeneration, bilateral, advanced atrophic without subfoveal involvement: Secondary | ICD-10-CM | POA: Diagnosis not present

## 2019-05-06 DIAGNOSIS — H3562 Retinal hemorrhage, left eye: Secondary | ICD-10-CM | POA: Diagnosis not present

## 2019-05-07 ENCOUNTER — Ambulatory Visit (INDEPENDENT_AMBULATORY_CARE_PROVIDER_SITE_OTHER): Payer: Medicare Other | Admitting: *Deleted

## 2019-05-07 ENCOUNTER — Other Ambulatory Visit: Payer: Self-pay

## 2019-05-07 DIAGNOSIS — Z5181 Encounter for therapeutic drug level monitoring: Secondary | ICD-10-CM

## 2019-05-07 DIAGNOSIS — I480 Paroxysmal atrial fibrillation: Secondary | ICD-10-CM

## 2019-05-07 LAB — POCT INR: INR: 1.9 — AB (ref 2.0–3.0)

## 2019-05-07 NOTE — Patient Instructions (Signed)
Description   Take 1 tablet today, then stop taking your coumadin on 10/30. Restart your normal dose of coumadin the evening of your procedure unless the Dr says otherwise. Normal dosage 1/2 tablet every day. Recheck INR 1 week post procedure. Call with any medication changes or if scheduled for any procedures [phone].

## 2019-05-08 NOTE — Pre-Procedure Instructions (Signed)
FOOD [facility] [id number] [place], [address] [place] [address] Phone: [phone] Fax: [place] [address] [place] [address] Phone: [phone] Fax: [phone]      Your procedure is scheduled on Wednesday, November 4th.  Report to [facility] Main Entrance "A" at 6:30 A.M., and check in at the Admitting office.  Call this number if you have problems the morning of surgery:  [phone]  Call [phone] if you have any questions prior to your surgery date Monday-Friday 8am-4pm    Remember:  Do not eat after midnight the night before your surgery  You may drink clear liquids until 5:30a.m. the morning of your surgery.   Clear liquids allowed are: Water, Non-Citrus Juices (without pulp), Carbonated Beverages, Clear Tea, Black Coffee Only, and Gatorade    Take these medicines the morning of surgery with A SIP OF WATER  memantine [facility])   Per your cardiologist, HOLD warfarin (COUMADIN) 5 days prior to surgery.   As of today, STOP taking any Aspirin (unless otherwise instructed by your surgeon), Aleve, Naproxen, Ibuprofen, Motrin, Advil, Goody's, BC's, all herbal medications, fish oil, and all vitamins.    The Morning of Surgery  Do not wear jewelry, make-up or nail polish.  Do not wear lotions, powders, or perfumes, or deodorant  Do not shave 48 hours prior to surgery.    Do not bring valuables to the hospital.  [facility] is not responsible for any belongings or valuables.  If you are a smoker, DO NOT Smoke 24 hours prior to surgery IF you wear a CPAP at night please bring your mask, tubing, and machine the morning of surgery   Remember that you must have someone to transport you home after your surgery, and remain with you for 24 hours if you are discharged the same day.   Contacts, glasses, hearing aids, dentures or  bridgework may not be worn into surgery.    Leave your suitcase in the car.  After surgery it may be brought to your room.  For patients admitted to the hospital, discharge time will be determined by your treatment team.  Patients discharged the day of surgery will not be allowed to drive home.    Special instructions:   [place]- Preparing For Surgery  Before surgery, you can play an important role. Because skin is not sterile, your skin needs to be as free of germs as possible. You can reduce the number of germs on your skin by washing with CHG (chlorahexidine gluconate) Soap before surgery.  CHG is an antiseptic cleaner which kills germs and bonds with the skin to continue killing germs even after washing.    Oral Hygiene is also important to reduce your risk of infection.  Remember - BRUSH YOUR TEETH THE MORNING OF SURGERY WITH YOUR REGULAR TOOTHPASTE  Please do not use if you have an allergy to CHG or antibacterial soaps. If your skin becomes reddened/irritated stop using the CHG.  Do not shave (including legs and underarms) for at least 48 hours prior to first CHG shower. It is OK to shave your face.  Please follow these instructions carefully.   1. Shower the NIGHT BEFORE SURGERY and the MORNING OF SURGERY with CHG Soap.   2. If you chose to wash your hair, wash your hair first as usual with your normal shampoo.  3. After you shampoo,  rinse your hair and body thoroughly to remove the shampoo.  4. Use CHG as you would any other liquid soap. You can apply CHG directly to the skin and wash gently with a scrungie or a clean washcloth.   5. Apply the CHG Soap to your body ONLY FROM THE NECK DOWN.  Do not use on open wounds or open sores. Avoid contact with your eyes, ears, mouth and genitals (private parts). Wash Face and genitals (private parts)  with your normal soap.   6. Wash thoroughly, paying special attention to the area where your surgery will be  performed.  7. Thoroughly rinse your body with warm water from the neck down.  8. DO NOT shower/wash with your normal soap after using and rinsing off the CHG Soap.  9. Pat yourself dry with a CLEAN TOWEL.  10. Wear CLEAN PAJAMAS to bed the night before surgery, wear comfortable clothes the morning of surgery  11. Place CLEAN SHEETS on your bed the night of your first shower and DO NOT SLEEP WITH PETS.    Day of Surgery:  Do not apply any deodorants/lotions. Please shower the morning of surgery with the CHG soap  Please wear clean clothes to the hospital/surgery center.   Remember to brush your teeth WITH YOUR REGULAR TOOTHPASTE.   Please read over the following fact sheets that you were given.

## 2019-05-09 ENCOUNTER — Encounter (HOSPITAL_COMMUNITY): Payer: Self-pay

## 2019-05-09 ENCOUNTER — Other Ambulatory Visit: Payer: Self-pay

## 2019-05-09 ENCOUNTER — Encounter (HOSPITAL_COMMUNITY)
Admission: RE | Admit: 2019-05-09 | Discharge: 2019-05-09 | Disposition: A | Discharge status: Home / Self Care | Payer: Medicare Other | Source: Ambulatory Visit | Attending: General Surgery | Admitting: General Surgery

## 2019-05-09 DIAGNOSIS — Z01812 Encounter for preprocedural laboratory examination: Secondary | ICD-10-CM | POA: Diagnosis not present

## 2019-05-09 DIAGNOSIS — I480 Paroxysmal atrial fibrillation: Secondary | ICD-10-CM | POA: Diagnosis not present

## 2019-05-09 DIAGNOSIS — I082 Rheumatic disorders of both aortic and tricuspid valves: Secondary | ICD-10-CM | POA: Diagnosis not present

## 2019-05-09 LAB — CBC
HCT: 43.8 % (ref 36.0–46.0)
Hemoglobin: 14.3 g/dL (ref 12.0–15.0)
MCH: 30.8 pg (ref 26.0–34.0)
MCHC: 32.6 g/dL (ref 30.0–36.0)
MCV: 94.2 fL (ref 80.0–100.0)
Platelets: 259 10*3/uL (ref 150–400)
RBC: 4.65 MIL/uL (ref 3.87–5.11)
RDW: 13.2 % (ref 11.5–15.5)
WBC: 8.9 10*3/uL (ref 4.0–10.5)
nRBC: 0 % (ref 0.0–0.2)

## 2019-05-09 LAB — BASIC METABOLIC PANEL
Anion gap: 11 (ref 5–15)
BUN: 18 mg/dL (ref 8–23)
CO2: 27 mmol/L (ref 22–32)
Calcium: 9.4 mg/dL (ref 8.9–10.3)
Chloride: 103 mmol/L (ref 98–111)
Creatinine, Ser: 0.86 mg/dL (ref 0.44–1.00)
GFR calc Af Amer: >60 mL/min (ref >60)
GFR calc non Af Amer: >60 mL/min (ref >60)
Glucose, Bld: 125 mg/dL — ABNORMAL HIGH (ref 70–99)
Potassium: 3.6 mmol/L (ref 3.5–5.1)
Sodium: 141 mmol/L (ref 135–145)

## 2019-05-09 NOTE — Progress Notes (Signed)
PCP - Dr. [name] Cardiologist - Dr. Liane Comber  PPM/ICD - N/A Device Orders -  Rep Notified -   Chest x-ray - N/A EKG - 03/31/19 Stress Test - denies ECHO - 09/11/16 Cardiac Cath -denies   Sleep Study - denies CPAP - denies  Blood Thinner Instructions:Hold wafarin 5 days prior to surgery. LD 05/08/19.  Aspirin Instructions:N/A  ERAS Protcol -Yes PRE-SURGERY Ensure or G2- not ordered.   COVID TEST- Saturday, October 31. Pt is aware to quarantine after testing.    Anesthesia review: Yes, cardiac clearance 04/01/19.   Patient denies shortness of breath, fever, cough and chest pain at PAT appointment   All instructions explained to the patient, with a verbal understanding of the material. Patient agrees to go over the instructions while at home for a better understanding. Patient also instructed to self quarantine after being tested for COVID-19. The opportunity to ask questions was provided.   Coronavirus Screening  Have you experienced the following symptoms:  Cough yes/no: No Fever (>100.85F)  yes/no: No Runny nose yes/no: No Sore throat yes/no: No Difficulty breathing/shortness of breath  yes/no: No  Have you or a family member traveled in the last 14 days and where? yes/no: No   If the patient indicates "YES" to the above questions, their PAT will be rescheduled to limit the exposure to others and, the surgeon will be notified. THE PATIENT WILL NEED TO BE ASYMPTOMATIC FOR 14 DAYS.   If the patient is not experiencing any of these symptoms, the PAT nurse will instruct them to NOT bring anyone with them to their appointment since they may have these symptoms or traveled as well.   Please remind your patients and families that hospital visitation restrictions are in effect and the importance of the restrictions.

## 2019-05-10 ENCOUNTER — Other Ambulatory Visit (HOSPITAL_COMMUNITY)
Admission: RE | Admit: 2019-05-10 | Discharge: 2019-05-10 | Disposition: A | Discharge status: Home / Self Care | Payer: Medicare Other | Source: Ambulatory Visit | Attending: General Surgery | Admitting: General Surgery

## 2019-05-10 DIAGNOSIS — Z20828 Contact with and (suspected) exposure to other viral communicable diseases: Secondary | ICD-10-CM | POA: Insufficient documentation

## 2019-05-10 DIAGNOSIS — Z01812 Encounter for preprocedural laboratory examination: Secondary | ICD-10-CM | POA: Insufficient documentation

## 2019-05-11 LAB — NOVEL CORONAVIRUS, NAA (HOSP ORDER, SEND-OUT TO REF LAB; TAT 18-24 HRS): SARS-CoV-2, NAA: NOT DETECTED

## 2019-05-12 NOTE — Anesthesia Preprocedure Evaluation (Addendum)
Anesthesia Evaluation  Patient identified by MRN, date of birth, ID band Patient awake    Reviewed: Allergy & Precautions, NPO status , Patient's Chart, lab work & pertinent test results  Airway Mallampati: II  TM Distance: >3 FB Neck ROM: Full    Dental  (+) Teeth Intact, Poor Dentition, Dental Advisory Given   Pulmonary neg pulmonary ROS, former smoker,    Pulmonary exam normal breath sounds clear to auscultation       Cardiovascular hypertension, Pt. on medications Normal cardiovascular exam+ dysrhythmias Atrial Fibrillation  Rhythm:Regular Rate:Normal  TTE 09/11/16: - Left ventricle: The cavity size was normal. There was moderate   focal basal hypertrophy of the septum with mild posterior wall   hypertrophy. Systolic function was normal. The estimated ejection   fraction was in the range of 60% to 65%. Wall motion was normal;   there were no regional wall motion abnormalities. Doppler   parameters are consistent with abnormal left ventricular   relaxation (grade 1 diastolic dysfunction). Doppler parameters   are consistent with indeterminate ventricular filling pressure. - Aortic valve: Transvalvular velocity was within the normal range.   There was no stenosis. There was mild regurgitation. - Right ventricle: The cavity size was normal. Wall thickness was   normal. Systolic function was normal. - Atrial septum: No defect or patent foramen ovale was identified   by color flow Doppler. - Tricuspid valve: There was mild regurgitation. - Pulmonary arteries: Systolic pressure was within the normal   range. PA peak pressure: 33 mm Hg (S).    PAF on coumadin- last dose: patient does not recall, but states she is taking it the way she was instructed. Does have someone at home helping her with med administration   Neuro/Psych PSYCHIATRIC DISORDERS Anxiety Memory loss   GI/Hepatic Diverticulosis, colonic polyps   Endo/Other   Hypothyroidism Borderline DM  Renal/GU   negative genitourinary   Musculoskeletal  (+) Arthritis , Osteoarthritis,  DJD   Abdominal Normal abdominal exam  (+)   Peds  Hematology   Anesthesia Other Findings HLD  Breast ca  Reproductive/Obstetrics negative OB ROS                                                            Anesthesia Evaluation  Patient identified by MRN, date of birth, ID band Patient awake    Reviewed: Allergy & Precautions, H&P , NPO status , Patient's Chart, lab work & pertinent test results, reviewed documented beta blocker date and time   Airway Mallampati: I TM Distance: >3 FB Neck ROM: Full    Dental No notable dental hx. (+) Teeth Intact and Dental Advisory Given   Pulmonary neg pulmonary ROS,  breath sounds clear to auscultation  Pulmonary exam normal       Cardiovascular hypertension, On Home Beta Blockers Rhythm:Regular Rate:Normal     Neuro/Psych negative neurological ROS  negative psych ROS   GI/Hepatic negative GI ROS, Neg liver ROS,   Endo/Other  negative endocrine ROSHypothyroidism   Renal/GU negative Renal ROS  negative genitourinary   Musculoskeletal   Abdominal   Peds  Hematology negative hematology ROS (+)   Anesthesia Other Findings   Reproductive/Obstetrics negative OB ROS  Anesthesia Physical Anesthesia Plan  ASA: II  Anesthesia Plan: General   Post-op Pain Management:    Induction: Intravenous  Airway Management Planned: LMA  Additional Equipment:   Intra-op Plan:   Post-operative Plan: Extubation in OR  Informed Consent: I have reviewed the patients History and Physical, chart, labs and discussed the procedure including the risks, benefits and alternatives for the proposed anesthesia with the patient or authorized representative who has indicated his/her understanding and acceptance.   Dental advisory  given  Plan Discussed with: CRNA  Anesthesia Plan Comments:         Anesthesia Quick Evaluation  Anesthesia Physical Anesthesia Plan  ASA: II  Anesthesia Plan: General   Post-op Pain Management:    Induction: Intravenous  PONV Risk Score and Plan: 3 and Ondansetron, Dexamethasone and Treatment may vary due to age or medical condition  Airway Management Planned: LMA  Additional Equipment: None  Intra-op Plan:   Post-operative Plan: Extubation in OR  Informed Consent: I have reviewed the patients History and Physical, chart, labs and discussed the procedure including the risks, benefits and alternatives for the proposed anesthesia with the patient or authorized representative who has indicated his/her understanding and acceptance.     Dental advisory given  Plan Discussed with: CRNA  Anesthesia Plan Comments:        Anesthesia Quick Evaluation

## 2019-05-12 NOTE — Progress Notes (Signed)
Anesthesia Chart Review: Follows yearly with cardiology for management of paroxysmal afib. Last seen by Dr. Meda Coffee 03/31/19. Per note, pt cleared for surgery "Preoperative evaluation for breast surgery - the patient is considered a low risk for intermediate risk surgery. She has no signs of angina or CHF and can achieve 4 METS. Coumadin clinic should assist with coumadin management prior and post surgery. No need for bridging."  Per pharmacy, pt to stop coumadin 5d preop.   Preop labs reviewed, WNL.  TTE 09/11/16: - Left ventricle: The cavity size was normal. There was moderate   focal basal hypertrophy of the septum with mild posterior wall   hypertrophy. Systolic function was normal. The estimated ejection   fraction was in the range of 60% to 65%. Wall motion was normal;   there were no regional wall motion abnormalities. Doppler   parameters are consistent with abnormal left ventricular   relaxation (grade 1 diastolic dysfunction). Doppler parameters   are consistent with indeterminate ventricular filling pressure. - Aortic valve: Transvalvular velocity was within the normal range.   There was no stenosis. There was mild regurgitation. - Right ventricle: The cavity size was normal. Wall thickness was   normal. Systolic function was normal. - Atrial septum: No defect or patent foramen ovale was identified   by color flow Doppler. - Tricuspid valve: There was mild regurgitation. - Pulmonary arteries: Systolic pressure was within the normal   range. PA peak pressure: 33 mm Hg (S).   Wynonia Musty [facility] Short Stay Center/Anesthesiology Phone [phone] 05/12/2019 2:21 PM

## 2019-05-13 DIAGNOSIS — C50411 Malignant neoplasm of upper-outer quadrant of right female breast: Secondary | ICD-10-CM | POA: Diagnosis not present

## 2019-05-14 ENCOUNTER — Ambulatory Visit (HOSPITAL_COMMUNITY)
Admission: RE | Admit: 2019-05-14 | Discharge: 2019-05-14 | Disposition: A | Discharge status: Home / Self Care | Payer: Medicare Other | Attending: General Surgery | Admitting: General Surgery

## 2019-05-14 ENCOUNTER — Ambulatory Visit (HOSPITAL_COMMUNITY): Payer: Medicare Other | Admitting: Anesthesiology

## 2019-05-14 ENCOUNTER — Encounter (HOSPITAL_COMMUNITY): Payer: Self-pay

## 2019-05-14 ENCOUNTER — Other Ambulatory Visit: Payer: Self-pay

## 2019-05-14 ENCOUNTER — Encounter (HOSPITAL_COMMUNITY)
Admission: RE | Disposition: A | Discharge status: Home / Self Care | Payer: Self-pay | Source: Home / Self Care | Attending: General Surgery

## 2019-05-14 ENCOUNTER — Ambulatory Visit (HOSPITAL_COMMUNITY): Payer: Medicare Other | Admitting: Vascular Surgery

## 2019-05-14 DIAGNOSIS — Z170 Estrogen receptor positive status [ER+]: Secondary | ICD-10-CM | POA: Insufficient documentation

## 2019-05-14 DIAGNOSIS — Z79899 Other long term (current) drug therapy: Secondary | ICD-10-CM | POA: Diagnosis not present

## 2019-05-14 DIAGNOSIS — Z87891 Personal history of nicotine dependence: Secondary | ICD-10-CM | POA: Insufficient documentation

## 2019-05-14 DIAGNOSIS — C50911 Malignant neoplasm of unspecified site of right female breast: Secondary | ICD-10-CM | POA: Diagnosis not present

## 2019-05-14 DIAGNOSIS — C50411 Malignant neoplasm of upper-outer quadrant of right female breast: Secondary | ICD-10-CM | POA: Insufficient documentation

## 2019-05-14 DIAGNOSIS — I10 Essential (primary) hypertension: Secondary | ICD-10-CM | POA: Diagnosis not present

## 2019-05-14 DIAGNOSIS — I480 Paroxysmal atrial fibrillation: Secondary | ICD-10-CM | POA: Diagnosis not present

## 2019-05-14 DIAGNOSIS — E785 Hyperlipidemia, unspecified: Secondary | ICD-10-CM | POA: Insufficient documentation

## 2019-05-14 DIAGNOSIS — Z7901 Long term (current) use of anticoagulants: Secondary | ICD-10-CM | POA: Diagnosis not present

## 2019-05-14 DIAGNOSIS — I4891 Unspecified atrial fibrillation: Secondary | ICD-10-CM | POA: Diagnosis not present

## 2019-05-14 DIAGNOSIS — E039 Hypothyroidism, unspecified: Secondary | ICD-10-CM | POA: Diagnosis not present

## 2019-05-14 DIAGNOSIS — N6011 Diffuse cystic mastopathy of right breast: Secondary | ICD-10-CM | POA: Diagnosis not present

## 2019-05-14 HISTORY — PX: BREAST LUMPECTOMY WITH RADIOACTIVE SEED LOCALIZATION: SHX6424

## 2019-05-14 LAB — PROTIME-INR
INR: 1.2 (ref 0.8–1.2)
Prothrombin Time: 15.2 seconds (ref 11.4–15.2)

## 2019-05-14 SURGERY — BREAST LUMPECTOMY WITH RADIOACTIVE SEED LOCALIZATION
Anesthesia: General | Site: Breast | Laterality: Right

## 2019-05-14 MED ORDER — PHENYLEPHRINE 40 MCG/ML (10ML) SYRINGE FOR IV PUSH (FOR BLOOD PRESSURE SUPPORT)
PREFILLED_SYRINGE | INTRAVENOUS | Status: AC
  Filled 2019-05-14: qty 10

## 2019-05-14 MED ORDER — PHENYLEPHRINE 40 MCG/ML (10ML) SYRINGE FOR IV PUSH (FOR BLOOD PRESSURE SUPPORT)
PREFILLED_SYRINGE | INTRAVENOUS | Status: DC | PRN
  Administered 2019-05-14 (×2): 80 ug via INTRAVENOUS

## 2019-05-14 MED ORDER — GLYCOPYRROLATE PF 0.2 MG/ML IJ SOSY
PREFILLED_SYRINGE | INTRAMUSCULAR | Status: AC
  Filled 2019-05-14: qty 1

## 2019-05-14 MED ORDER — HYDROCODONE-ACETAMINOPHEN 5-325 MG PO TABS: 1.0000 | ORAL_TABLET | Freq: Four times a day (QID) | ORAL | 0 refills | Status: DC | PRN

## 2019-05-14 MED ORDER — LIDOCAINE 2% (20 MG/ML) 5 ML SYRINGE
INTRAMUSCULAR | Status: AC
  Filled 2019-05-14: qty 5

## 2019-05-14 MED ORDER — FENTANYL CITRATE (PF) 250 MCG/5ML IJ SOLN
INTRAMUSCULAR | Status: DC | PRN
  Administered 2019-05-14 (×2): 25 ug via INTRAVENOUS

## 2019-05-14 MED ORDER — CELECOXIB 200 MG PO CAPS
200.0000 mg | ORAL_CAPSULE | ORAL | Status: AC
  Administered 2019-05-14: 07:00:00 200 mg via ORAL
  Filled 2019-05-14: qty 1

## 2019-05-14 MED ORDER — LACTATED RINGERS IV SOLN
INTRAVENOUS | Status: DC | PRN
  Administered 2019-05-14: 08:00:00 via INTRAVENOUS

## 2019-05-14 MED ORDER — EPHEDRINE 5 MG/ML INJ
INTRAVENOUS | Status: AC
  Filled 2019-05-14: qty 10

## 2019-05-14 MED ORDER — PROPOFOL 10 MG/ML IV BOLUS
INTRAVENOUS | Status: AC
  Filled 2019-05-14: qty 20

## 2019-05-14 MED ORDER — CHLORHEXIDINE GLUCONATE CLOTH 2 % EX PADS: 6.0000 | MEDICATED_PAD | Freq: Once | CUTANEOUS | Status: DC

## 2019-05-14 MED ORDER — EPHEDRINE 5 MG/ML INJ
INTRAVENOUS | Status: AC
  Filled 2019-05-14: qty 20

## 2019-05-14 MED ORDER — PROPOFOL 10 MG/ML IV BOLUS
INTRAVENOUS | Status: DC | PRN
  Administered 2019-05-14: 100 mg via INTRAVENOUS

## 2019-05-14 MED ORDER — GLYCOPYRROLATE 0.2 MG/ML IJ SOLN
INTRAMUSCULAR | Status: DC | PRN
  Administered 2019-05-14 (×2): 0.1 mg via INTRAVENOUS

## 2019-05-14 MED ORDER — DEXAMETHASONE SODIUM PHOSPHATE 10 MG/ML IJ SOLN
INTRAMUSCULAR | Status: AC
  Filled 2019-05-14: qty 1

## 2019-05-14 MED ORDER — 0.9 % SODIUM CHLORIDE (POUR BTL) OPTIME
TOPICAL | Status: DC | PRN
  Administered 2019-05-14: 1000 mL

## 2019-05-14 MED ORDER — BUPIVACAINE HCL (PF) 0.5 % IJ SOLN
INTRAMUSCULAR | Status: AC
  Filled 2019-05-14: qty 30

## 2019-05-14 MED ORDER — CEFAZOLIN SODIUM-DEXTROSE 2-4 GM/100ML-% IV SOLN
2.0000 g | INTRAVENOUS | Status: AC
  Administered 2019-05-14: 2 g via INTRAVENOUS
  Filled 2019-05-14: qty 100

## 2019-05-14 MED ORDER — BUPIVACAINE-EPINEPHRINE 0.5% -1:200000 IJ SOLN
INTRAMUSCULAR | Status: AC
  Filled 2019-05-14: qty 1

## 2019-05-14 MED ORDER — ONDANSETRON HCL 4 MG/2ML IJ SOLN
INTRAMUSCULAR | Status: DC | PRN
  Administered 2019-05-14: 4 mg via INTRAVENOUS

## 2019-05-14 MED ORDER — ONDANSETRON HCL 4 MG/2ML IJ SOLN
INTRAMUSCULAR | Status: AC
  Filled 2019-05-14: qty 2

## 2019-05-14 MED ORDER — BUPIVACAINE HCL (PF) 0.5 % IJ SOLN
INTRAMUSCULAR | Status: DC | PRN
  Administered 2019-05-14: 20 mL

## 2019-05-14 MED ORDER — LIDOCAINE 2% (20 MG/ML) 5 ML SYRINGE
INTRAMUSCULAR | Status: DC | PRN
  Administered 2019-05-14: 40 mg via INTRAVENOUS

## 2019-05-14 MED ORDER — FENTANYL CITRATE (PF) 250 MCG/5ML IJ SOLN
INTRAMUSCULAR | Status: AC
  Filled 2019-05-14: qty 5

## 2019-05-14 MED ORDER — ACETAMINOPHEN 500 MG PO TABS
1000.0000 mg | ORAL_TABLET | ORAL | Status: AC
  Administered 2019-05-14: 1000 mg via ORAL
  Filled 2019-05-14: qty 2

## 2019-05-14 MED ORDER — DEXAMETHASONE SODIUM PHOSPHATE 10 MG/ML IJ SOLN
INTRAMUSCULAR | Status: DC | PRN
  Administered 2019-05-14: 10 mg via INTRAVENOUS

## 2019-05-14 MED ORDER — EPHEDRINE SULFATE-NACL 50-0.9 MG/10ML-% IV SOSY
PREFILLED_SYRINGE | INTRAVENOUS | Status: DC | PRN
  Administered 2019-05-14 (×3): 10 mg via INTRAVENOUS
  Administered 2019-05-14: 15 mg via INTRAVENOUS

## 2019-05-14 MED ORDER — GABAPENTIN 300 MG PO CAPS
300.0000 mg | ORAL_CAPSULE | ORAL | Status: AC
  Administered 2019-05-14: 300 mg via ORAL
  Filled 2019-05-14: qty 1

## 2019-05-14 SURGICAL SUPPLY — 34 items
ADH SKN CLS APL DERMABOND .7 (GAUZE/BANDAGES/DRESSINGS) ×1
APL PRP STRL LF DISP 70% ISPRP (MISCELLANEOUS) ×1
APPLIER CLIP 9.375 MED OPEN (MISCELLANEOUS) ×2
APR CLP MED 9.3 20 MLT OPN (MISCELLANEOUS) ×1
BINDER BREAST LRG (GAUZE/BANDAGES/DRESSINGS) IMPLANT
BINDER BREAST XLRG (GAUZE/BANDAGES/DRESSINGS) IMPLANT
CANISTER SUCT 3000ML PPV (MISCELLANEOUS) ×2 IMPLANT
CHLORAPREP W/TINT 26 (MISCELLANEOUS) ×2 IMPLANT
CLIP APPLIE 9.375 MED OPEN (MISCELLANEOUS) IMPLANT
COVER PROBE W GEL 5X96 (DRAPES) ×2 IMPLANT
COVER SURGICAL LIGHT HANDLE (MISCELLANEOUS) ×2 IMPLANT
COVER WAND RF STERILE (DRAPES) IMPLANT
DERMABOND ADVANCED (GAUZE/BANDAGES/DRESSINGS) ×1
DERMABOND ADVANCED .7 DNX12 (GAUZE/BANDAGES/DRESSINGS) ×1 IMPLANT
DEVICE DUBIN SPECIMEN MAMMOGRA (MISCELLANEOUS) ×2 IMPLANT
DRAPE CHEST BREAST 15X10 FENES (DRAPES) ×2 IMPLANT
ELECT COATED BLADE 2.86 ST (ELECTRODE) ×2 IMPLANT
ELECT REM PT RETURN 9FT ADLT (ELECTROSURGICAL) ×2
ELECTRODE REM PT RTRN 9FT ADLT (ELECTROSURGICAL) ×1 IMPLANT
GLOVE BIO SURGEON STRL SZ7.5 (GLOVE) ×4 IMPLANT
GOWN STRL REUS W/ TWL LRG LVL3 (GOWN DISPOSABLE) ×2 IMPLANT
GOWN STRL REUS W/TWL LRG LVL3 (GOWN DISPOSABLE) ×4
KIT BASIN OR (CUSTOM PROCEDURE TRAY) ×2 IMPLANT
KIT MARKER MARGIN INK (KITS) ×2 IMPLANT
NDL HYPO 25GX1X1/2 BEV (NEEDLE) ×1 IMPLANT
NEEDLE HYPO 25GX1X1/2 BEV (NEEDLE) ×2 IMPLANT
NS IRRIG 1000ML POUR BTL (IV SOLUTION) ×2 IMPLANT
PACK GENERAL/GYN (CUSTOM PROCEDURE TRAY) ×2 IMPLANT
SUT MNCRL AB 4-0 PS2 18 (SUTURE) ×2 IMPLANT
SUT SILK 2 0 SH (SUTURE) IMPLANT
SUT VIC AB 3-0 SH 18 (SUTURE) ×2 IMPLANT
SYR CONTROL 10ML LL (SYRINGE) ×2 IMPLANT
TOWEL GREEN STERILE (TOWEL DISPOSABLE) ×2 IMPLANT
TOWEL GREEN STERILE FF (TOWEL DISPOSABLE) ×2 IMPLANT

## 2019-05-14 NOTE — H&P (Signed)
Cynthia Mathis  Location: [facility] Patient #: [id number] DOB: 10-05-1932 Widowed / Language: Cleophus Molt / Race: White Female   History of Present Illness The patient is a 85 year old female who presents with breast cancer. We are asked to see the patient in consultation by Dr. Emmit Pomfret to evaluate her for a new right breast cancer. The patient is a 85 year old white female who recently went for a routine screening mammogram. At that time she was found to have a 6 mm abnormality in the upper outer quadrant of the right breast. Her axilla looked negative. The mass was biopsied and came back as an invasive ductal type of breast cancer that was ER/PR positive and HER-2/neu negative with a Ki-67 of 10%. She has no family history of breast cancer. She is on a blood thinner for atrial fibrillation and is followed by Dr. Darylene Price in cardiology   Past Surgical History  Breast Biopsy  Right. Cataract Surgery  Bilateral. Thyroid Surgery   Diagnostic Studies History Colonoscopy  5-10 years ago Mammogram  within last year Pap Smear  1-5 years ago  Allergies  No Known Allergies   Medication History  Warfarin Sodium (5MG Tablet, Oral) Active. Verapamil HCl ER (240MG Tablet ER, Oral) Active. Losartan Potassium (100MG Tablet, Oral) Active. hydroCHLOROthiazide (25MG Tablet, Oral) Active. Atorvastatin Calcium (20MG Tablet, Oral) Active. Donepezil HCl (10MG Tablet, Oral) Active. Memantine HCl (10MG Tablet, Oral) Active. Medications Reconciled  Social History ( Alcohol use  Occasional alcohol use. No caffeine use  No drug use  Tobacco use  Never smoker.  Family History First Degree Relatives  No pertinent family history   Pregnancy / Birth History Age at menarche  64 years. Age of menopause  [age] Contraceptive History  Oral contraceptives. Gravida  3 Length (months) of breastfeeding  3-6 Maternal age  72-30 Para  3 Regular  periods   Other Problems  High blood pressure     Review of Systems  General Not Present- Appetite Loss, Chills, Fatigue, Fever, Night Sweats, Weight Gain and Weight Loss. Skin Not Present- Change in Wart/Mole, Dryness, Hives, Jaundice, New Lesions, Non-Healing Wounds, Rash and Ulcer. HEENT Not Present- Earache, Hearing Loss, Hoarseness, Nose Bleed, Oral Ulcers, Ringing in the Ears, Seasonal Allergies, Sinus Pain, Sore Throat, Visual Disturbances, Wears glasses/contact lenses and Yellow Eyes. Respiratory Not Present- Bloody sputum, Chronic Cough, Difficulty Breathing, Snoring and Wheezing. Breast Present- Breast Mass. Not Present- Breast Pain, Nipple Discharge and Skin Changes. Cardiovascular Not Present- Chest Pain, Difficulty Breathing Lying Down, Leg Cramps, Palpitations, Rapid Heart Rate, Shortness of Breath and Swelling of Extremities. Gastrointestinal Not Present- Abdominal Pain, Bloating, Bloody Stool, Change in Bowel Habits, Chronic diarrhea, Constipation, Difficulty Swallowing, Excessive gas, Gets full quickly at meals, Hemorrhoids, Indigestion, Nausea, Rectal Pain and Vomiting. Female Genitourinary Not Present- Frequency, Nocturia, Painful Urination, Pelvic Pain and Urgency. Musculoskeletal Not Present- Back Pain, Joint Pain, Joint Stiffness, Muscle Pain, Muscle Weakness and Swelling of Extremities. Neurological Not Present- Decreased Memory, Fainting, Headaches, Numbness, Seizures, Tingling, Tremor, Trouble walking and Weakness. Psychiatric Not Present- Anxiety, Bipolar, Change in Sleep Pattern, Depression, Fearful and Frequent crying. Endocrine Not Present- Cold Intolerance, Excessive Hunger, Hair Changes, Heat Intolerance, Hot flashes and New Diabetes. Hematology Present- Blood Thinners. Not Present- Easy Bruising, Excessive bleeding, Gland problems, HIV and Persistent Infections.  Vitals  Weight: 139.4 lb Height: 63in Body Surface Area: 1.66 m Body Mass Index: 24.69  kg/m  Temp.: 97.54F(Temporal)  Pulse: 97 (Regular)  BP: 138/68 (Sitting, Left Arm, Standard)  Physical Exam  General Mental Status-Alert. General Appearance-Consistent with stated age. Hydration-Well hydrated. Voice-Normal.  Head and Neck Head-normocephalic, atraumatic with no lesions or palpable masses. Trachea-midline. Thyroid Gland Characteristics - normal size and consistency.  Eye Eyeball - Bilateral-Extraocular movements intact. Sclera/Conjunctiva - Bilateral-No scleral icterus.  Chest and Lung Exam Chest and lung exam reveals -quiet, even and easy respiratory effort with no use of accessory muscles and on auscultation, normal breath sounds, no adventitious sounds and normal vocal resonance. Inspection Chest Wall - Normal. Back - normal.  Breast Note: There is no palpable mass in either breast. There is no palpable axillary, supraclavicular, or cervical lymphadenopathy.   Cardiovascular Cardiovascular examination reveals -normal heart sounds, regular rate and rhythm with no murmurs and normal pedal pulses bilaterally.  Abdomen Inspection Inspection of the abdomen reveals - No Hernias. Skin - Scar - no surgical scars. Palpation/Percussion Palpation and Percussion of the abdomen reveal - Soft, Non Tender, No Rebound tenderness, No Rigidity (guarding) and No hepatosplenomegaly. Auscultation Auscultation of the abdomen reveals - Bowel sounds normal.  Neurologic Neurologic evaluation reveals -alert and oriented x 3 with no impairment of recent or remote memory. Mental Status-Normal.  Musculoskeletal Normal Exam - Left-Upper Extremity Strength Normal and Lower Extremity Strength Normal. Normal Exam - Right-Upper Extremity Strength Normal and Lower Extremity Strength Normal.  Lymphatic Head & Neck  General Head & Neck Lymphatics: Bilateral - Description - Normal. Axillary  General Axillary Region: Bilateral -  Description - Normal. Tenderness - Non Tender. Femoral & Inguinal  Generalized Femoral & Inguinal Lymphatics: Bilateral - Description - Normal. Tenderness - Non Tender.    Assessment & Plan MALIGNANT NEOPLASM OF UPPER-OUTER QUADRANT OF RIGHT BREAST IN FEMALE, ESTROGEN RECEPTOR POSITIVE (C50.411) Impression: The patient appears to have a small stage I cancer in the upper outer quadrant of the right breast. I have talked to her and her family today in detail about the options for treatment. I believe she would be a good candidate for breast conservation and she would favor this as well. Given her cardiac history and the fact that she is on a blood thinner we will need cardiac clearance prior to scheduling surgery. I will also go ahead and refer her to medical and radiation oncology who will help [place] with adjuvant therapy. Once we have clearance we will proceed with right breast radioactive seed localized lumpectomy. I have discussed with her in detail the risks and benefits of the operation as well as some of the technical aspects and she understands and wishes to proceed Current Plans Referred to Oncology, for evaluation and follow up (Oncology). Routine.

## 2019-05-14 NOTE — Addendum Note (Signed)
Addendum  created 05/14/19 1109 by Trinna Post., CRNA   Flowsheet accepted, Intraprocedure Flowsheets edited, Intraprocedure Meds edited

## 2019-05-14 NOTE — Interval H&P Note (Signed)
History and Physical Interval Note:  05/14/2019 8:20 AM  Cynthia Mathis  has presented today for surgery, with the diagnosis of RIGHT BREAST CANCER.  The various methods of treatment have been discussed with the patient and family. After consideration of risks, benefits and other options for treatment, the patient has consented to  Procedure(s): RIGHT BREAST LUMPECTOMY WITH RADIOACTIVE SEED LOCALIZATION (Right) as a surgical intervention.  The patient's history has been reviewed, patient examined, no change in status, stable for surgery.  I have reviewed the patient's chart and labs.  Questions were answered to the patient's satisfaction.     Autumn Messing III

## 2019-05-14 NOTE — Anesthesia Procedure Notes (Signed)
Procedure Name: LMA Insertion Date/Time: 05/14/2019 8:40 AM Performed by: Trinna Post., CRNA Pre-anesthesia Checklist: Patient identified, Emergency Drugs available, Suction available, Patient being monitored and Timeout performed Patient Re-evaluated:Patient Re-evaluated prior to induction Oxygen Delivery Method: Circle system utilized Preoxygenation: Pre-oxygenation with 100% oxygen Induction Type: IV induction Ventilation: Mask ventilation without difficulty LMA: LMA inserted LMA Size: 4.0 Number of attempts: 1 Placement Confirmation: positive ETCO2 and breath sounds checked- equal and bilateral Tube secured with: Tape Dental Injury: Teeth and Oropharynx as per pre-operative assessment

## 2019-05-14 NOTE — Anesthesia Postprocedure Evaluation (Signed)
Anesthesia Post Note  Patient: Cynthia Mathis  Procedure(s) Performed: RIGHT BREAST LUMPECTOMY WITH RADIOACTIVE SEED LOCALIZATION (Right Breast)     Patient location during evaluation: PACU Anesthesia Type: General Level of consciousness: awake and alert, oriented and patient cooperative Pain management: pain level controlled Vital Signs Assessment: post-procedure vital signs reviewed and stable Respiratory status: spontaneous breathing, nonlabored ventilation and respiratory function stable Cardiovascular status: blood pressure returned to baseline and stable Postop Assessment: no apparent nausea or vomiting Anesthetic complications: no    Last Vitals:  Vitals:   05/14/19 0708 05/14/19 0928  BP: (!) 175/50 (!) 122/50  Pulse: (!) 54 85  Resp: 18   Temp: 36.8 C (!) 36.4 C  SpO2: 99% 100%    Last Pain:  Vitals:   05/14/19 0928  TempSrc:   PainSc: 0-No pain                 Pervis Hocking

## 2019-05-14 NOTE — Transfer of Care (Signed)
Immediate Anesthesia Transfer of Care Note  Patient: Cynthia Mathis  Procedure(s) Performed: RIGHT BREAST LUMPECTOMY WITH RADIOACTIVE SEED LOCALIZATION (Right Breast)  Patient Location: PACU  Anesthesia Type:General  Level of Consciousness: awake, alert  and oriented  Airway & Oxygen Therapy: Patient Spontanous Breathing and Patient connected to nasal cannula oxygen  Post-op Assessment: Report given to RN and Post -op Vital signs reviewed and stable  Post vital signs: Reviewed and stable  Last Vitals:  Vitals Value Taken Time  BP 122/50 05/14/19 0928  Temp    Pulse 73 05/14/19 0932  Resp 14 05/14/19 0932  SpO2 100 % 05/14/19 0932  Vitals shown include unvalidated device data.  Last Pain:  Vitals:   05/14/19 0708  TempSrc: Oral  PainSc:          Complications: No apparent anesthesia complications

## 2019-05-14 NOTE — Op Note (Signed)
05/14/2019  9:20 AM  PATIENT:  Cynthia Mathis  85 y.o. female  PRE-OPERATIVE DIAGNOSIS:  RIGHT BREAST CANCER  POST-OPERATIVE DIAGNOSIS:  RIGHT BREAST CANCER  PROCEDURE:  Procedure(s): RIGHT BREAST LUMPECTOMY WITH RADIOACTIVE SEED LOCALIZATION (Right)  SURGEON:  Surgeon(s) and Role:    * [name], MD - Primary  PHYSICIAN ASSISTANT:   ASSISTANTS: none   ANESTHESIA:   local and general  EBL:  10 mL   BLOOD ADMINISTERED:none  DRAINS: none   LOCAL MEDICATIONS USED:  MARCAINE     SPECIMEN:  Source of Specimen:  right breast tissue  DISPOSITION OF SPECIMEN:  PATHOLOGY  COUNTS:  YES  TOURNIQUET:  * No tourniquets in log *  DICTATION: .Dragon Dictation   After informed consent was obtained the patient was brought to the operating room placed in the supine position on the operating table.  After adequate induction of general anesthesia the patient's right breast was prepped with ChloraPrep, allowed to dry, and draped in usual sterile manner.  An appropriate timeout was performed.  Previously an I-125 seed was placed in the upper outer quadrant of the right breast to mark an area of invasive breast cancer.  The neoprobe was set to I-125 in the area of radioactivity was readily identified in the right upper quadrant.  The area around this was infiltrated with quarter percent Marcaine.  A curvilinear incision was made along the upper outer edge of the areola of the right breast with a 15 blade knife.  The incision was carried through the skin and subcutaneous tissue sharply with the electrocautery.  Dissection was then carried out between the breast tissue and the subcutaneous fat and skin in the right upper quadrant.  Once the dissection was well beyond the area of the cancer then a circular portion of breast tissue was excised sharply with the electrocautery around the radioactive seed while checking the area of radioactivity frequently.  Once the specimen was removed it was  oriented with the appropriate paint colors.  A specimen radiograph was obtained that showed the clip and seed to be near the center of the specimen.  The specimen was then sent to pathology for further evaluation.  Hemostasis was achieved using the Bovie electrocautery.  The wound was irrigated with saline and infiltrated with more quarter percent Marcaine.  The cavity was marked with clips.  The deep layer of the wound was then closed with layers of interrupted 3-0 Vicryl stitches.  The skin was closed with interrupted 4-0 Monocryl subcuticular stitches.  Dermabond dressings were applied.  The patient tolerated the procedure well.  At the end of the case all needle sponge and instrument counts were correct.  The patient was then awakened and taken to recovery in stable condition.  PLAN OF CARE: Discharge to home after PACU  PATIENT DISPOSITION:  PACU - hemodynamically stable.   Delay start of Pharmacological VTE agent (>24hrs) due to surgical blood loss or risk of bleeding: not applicable

## 2019-05-15 ENCOUNTER — Encounter (HOSPITAL_COMMUNITY): Payer: Self-pay | Admitting: General Surgery

## 2019-05-15 ENCOUNTER — Telehealth: Payer: Self-pay

## 2019-05-15 DIAGNOSIS — M795 Residual foreign body in soft tissue: Secondary | ICD-10-CM

## 2019-05-15 NOTE — Telephone Encounter (Signed)
Copied from [place] [id number]. Topic: General - Inquiry >> May 15, 2019  8:55 AM Reyne Dumas L wrote: Reason for CRM:   Pt's son and pt calling in.  States pt had an xray done 04/24/2019 because pt thought she swallowed an earring - but xray showed nothing.  Pt feels that the earring is still stuck in the upper esophagus.  States that it was metal with a fake diamond (around the size of an eraser head).  Pt's son wonders if first xray done - only of stomach - would have shown something if it was in the upper esophagus. [name] can be reached at [phone]

## 2019-05-16 LAB — SURGICAL PATHOLOGY

## 2019-05-16 NOTE — Telephone Encounter (Signed)
Patient son [name] returned a call to [place]. Can be reached at Ph# [phone]

## 2019-05-16 NOTE — Telephone Encounter (Signed)
Left message to return phone call.

## 2019-05-16 NOTE — Telephone Encounter (Signed)
NO the abdominal US did not look at her esophagus. The best way to see this would be a CXR. Have her schedule to come by today for a CXR and I will order it

## 2019-05-19 ENCOUNTER — Encounter (HOSPITAL_COMMUNITY): Payer: Self-pay | Admitting: General Surgery

## 2019-05-19 NOTE — Telephone Encounter (Signed)
Spoke to Ciales and he was notified of update. Pt stated he will call his mom to see what day she could come in.

## 2019-05-21 ENCOUNTER — Ambulatory Visit: Payer: Medicare Other | Admitting: *Deleted

## 2019-05-21 ENCOUNTER — Other Ambulatory Visit: Payer: Self-pay

## 2019-05-21 DIAGNOSIS — Z5181 Encounter for therapeutic drug level monitoring: Secondary | ICD-10-CM | POA: Diagnosis not present

## 2019-05-21 DIAGNOSIS — I480 Paroxysmal atrial fibrillation: Secondary | ICD-10-CM

## 2019-05-21 LAB — POCT INR: INR: 1.6 — AB (ref 2.0–3.0)

## 2019-05-21 NOTE — Patient Instructions (Signed)
Description   Take 1 tablet today and tomorrow , then continue same dosage 1/2 tablet every day. Recheck INR in 1 week Call with any medication changes or if scheduled for any procedures [id number].

## 2019-05-28 ENCOUNTER — Ambulatory Visit: Payer: Medicare Other | Admitting: *Deleted

## 2019-05-28 ENCOUNTER — Other Ambulatory Visit: Payer: Self-pay

## 2019-05-28 DIAGNOSIS — Z5181 Encounter for therapeutic drug level monitoring: Secondary | ICD-10-CM | POA: Diagnosis not present

## 2019-05-28 DIAGNOSIS — I480 Paroxysmal atrial fibrillation: Secondary | ICD-10-CM

## 2019-05-28 LAB — POCT INR: INR: 2 (ref 2.0–3.0)

## 2019-05-28 NOTE — Patient Instructions (Signed)
Description   Take 1 tablet today, then continue same dosage 1/2 tablet every day. Recheck INR in 2 weeks. Call with any medication changes or if scheduled for any procedures [phone].

## 2019-05-29 ENCOUNTER — Telehealth: Payer: Self-pay | Admitting: *Deleted

## 2019-05-29 NOTE — Telephone Encounter (Signed)
Spoke with patient to confirm her appointment for 12/3 at 1 for labs and see Mendel Ryder at 1:30.

## 2019-06-09 ENCOUNTER — Encounter: Payer: Self-pay | Admitting: Family Medicine

## 2019-06-09 DIAGNOSIS — Z961 Presence of intraocular lens: Secondary | ICD-10-CM | POA: Diagnosis not present

## 2019-06-09 DIAGNOSIS — H353232 Exudative age-related macular degeneration, bilateral, with inactive choroidal neovascularization: Secondary | ICD-10-CM | POA: Diagnosis not present

## 2019-06-09 DIAGNOSIS — H524 Presbyopia: Secondary | ICD-10-CM | POA: Diagnosis not present

## 2019-06-09 DIAGNOSIS — H43393 Other vitreous opacities, bilateral: Secondary | ICD-10-CM | POA: Diagnosis not present

## 2019-06-09 DIAGNOSIS — H40013 Open angle with borderline findings, low risk, bilateral: Secondary | ICD-10-CM | POA: Diagnosis not present

## 2019-06-10 DIAGNOSIS — H3562 Retinal hemorrhage, left eye: Secondary | ICD-10-CM | POA: Diagnosis not present

## 2019-06-10 DIAGNOSIS — H353133 Nonexudative age-related macular degeneration, bilateral, advanced atrophic without subfoveal involvement: Secondary | ICD-10-CM | POA: Diagnosis not present

## 2019-06-10 DIAGNOSIS — H353211 Exudative age-related macular degeneration, right eye, with active choroidal neovascularization: Secondary | ICD-10-CM | POA: Diagnosis not present

## 2019-06-10 DIAGNOSIS — H353221 Exudative age-related macular degeneration, left eye, with active choroidal neovascularization: Secondary | ICD-10-CM | POA: Diagnosis not present

## 2019-06-11 ENCOUNTER — Other Ambulatory Visit: Payer: Self-pay

## 2019-06-11 ENCOUNTER — Ambulatory Visit: Payer: Medicare Other | Admitting: *Deleted

## 2019-06-11 DIAGNOSIS — I480 Paroxysmal atrial fibrillation: Secondary | ICD-10-CM

## 2019-06-11 DIAGNOSIS — Z5181 Encounter for therapeutic drug level monitoring: Secondary | ICD-10-CM | POA: Diagnosis not present

## 2019-06-11 LAB — POCT INR: INR: 2.4 (ref 2.0–3.0)

## 2019-06-11 NOTE — Patient Instructions (Signed)
Description   Continue same dosage 1/2 tablet every day. Recheck INR in 4 weeks. Call with any medication changes or if scheduled for any procedures [phone].

## 2019-06-12 ENCOUNTER — Telehealth: Payer: Self-pay | Admitting: Oncology

## 2019-06-12 ENCOUNTER — Other Ambulatory Visit: Payer: Self-pay

## 2019-06-12 ENCOUNTER — Telehealth: Payer: Self-pay

## 2019-06-12 ENCOUNTER — Inpatient Hospital Stay: Payer: Medicare Other

## 2019-06-12 ENCOUNTER — Inpatient Hospital Stay: Payer: Medicare Other | Attending: Oncology | Admitting: Adult Health

## 2019-06-12 ENCOUNTER — Encounter: Payer: Self-pay | Admitting: Adult Health

## 2019-06-12 VITALS — BP 148/57 | HR 60 | Temp 98.0°F | Resp 17 | Ht 62.0 in | Wt 142.9 lb

## 2019-06-12 DIAGNOSIS — C50211 Malignant neoplasm of upper-inner quadrant of right female breast: Secondary | ICD-10-CM | POA: Diagnosis not present

## 2019-06-12 DIAGNOSIS — M25561 Pain in right knee: Secondary | ICD-10-CM | POA: Diagnosis not present

## 2019-06-12 DIAGNOSIS — E785 Hyperlipidemia, unspecified: Secondary | ICD-10-CM | POA: Diagnosis not present

## 2019-06-12 DIAGNOSIS — M810 Age-related osteoporosis without current pathological fracture: Secondary | ICD-10-CM | POA: Insufficient documentation

## 2019-06-12 DIAGNOSIS — Z86010 Personal history of colonic polyps: Secondary | ICD-10-CM | POA: Insufficient documentation

## 2019-06-12 DIAGNOSIS — Z87891 Personal history of nicotine dependence: Secondary | ICD-10-CM | POA: Diagnosis not present

## 2019-06-12 DIAGNOSIS — Z79899 Other long term (current) drug therapy: Secondary | ICD-10-CM | POA: Insufficient documentation

## 2019-06-12 DIAGNOSIS — M8589 Other specified disorders of bone density and structure, multiple sites: Secondary | ICD-10-CM

## 2019-06-12 DIAGNOSIS — Z170 Estrogen receptor positive status [ER+]: Secondary | ICD-10-CM

## 2019-06-12 DIAGNOSIS — I10 Essential (primary) hypertension: Secondary | ICD-10-CM | POA: Insufficient documentation

## 2019-06-12 DIAGNOSIS — C50411 Malignant neoplasm of upper-outer quadrant of right female breast: Secondary | ICD-10-CM | POA: Diagnosis not present

## 2019-06-12 DIAGNOSIS — E039 Hypothyroidism, unspecified: Secondary | ICD-10-CM | POA: Insufficient documentation

## 2019-06-12 DIAGNOSIS — I480 Paroxysmal atrial fibrillation: Secondary | ICD-10-CM

## 2019-06-12 DIAGNOSIS — Z7901 Long term (current) use of anticoagulants: Secondary | ICD-10-CM | POA: Diagnosis not present

## 2019-06-12 DIAGNOSIS — Z79811 Long term (current) use of aromatase inhibitors: Secondary | ICD-10-CM | POA: Insufficient documentation

## 2019-06-12 DIAGNOSIS — F419 Anxiety disorder, unspecified: Secondary | ICD-10-CM | POA: Insufficient documentation

## 2019-06-12 DIAGNOSIS — G3184 Mild cognitive impairment, so stated: Secondary | ICD-10-CM

## 2019-06-12 LAB — CBC WITH DIFFERENTIAL/PLATELET
Abs Immature Granulocytes: 0.02 10*3/uL (ref 0.00–0.07)
Basophils Absolute: 0.1 10*3/uL (ref 0.0–0.1)
Basophils Relative: 1 %
Eosinophils Absolute: 0.1 10*3/uL (ref 0.0–0.5)
Eosinophils Relative: 1 %
HCT: 40 % (ref 36.0–46.0)
Hemoglobin: 13.3 g/dL (ref 12.0–15.0)
Immature Granulocytes: 0 %
Lymphocytes Relative: 29 %
Lymphs Abs: 2.7 10*3/uL (ref 0.7–4.0)
MCH: 30.5 pg (ref 26.0–34.0)
MCHC: 33.3 g/dL (ref 30.0–36.0)
MCV: 91.7 fL (ref 80.0–100.0)
Monocytes Absolute: 0.9 10*3/uL (ref 0.1–1.0)
Monocytes Relative: 10 %
Neutro Abs: 5.6 10*3/uL (ref 1.7–7.7)
Neutrophils Relative %: 59 %
Platelets: 245 10*3/uL (ref 150–400)
RBC: 4.36 MIL/uL (ref 3.87–5.11)
RDW: 12.7 % (ref 11.5–15.5)
WBC: 9.5 10*3/uL (ref 4.0–10.5)
nRBC: 0 % (ref 0.0–0.2)

## 2019-06-12 LAB — COMPREHENSIVE METABOLIC PANEL
ALT: 24 U/L (ref 0–44)
AST: 24 U/L (ref 15–41)
Albumin: 3.9 g/dL (ref 3.5–5.0)
Alkaline Phosphatase: 65 U/L (ref 38–126)
Anion gap: 7 (ref 5–15)
BUN: 17 mg/dL (ref 8–23)
CO2: 32 mmol/L (ref 22–32)
Calcium: 9.7 mg/dL (ref 8.9–10.3)
Chloride: 103 mmol/L (ref 98–111)
Creatinine, Ser: 1 mg/dL (ref 0.44–1.00)
GFR calc Af Amer: 60 mL/min — ABNORMAL LOW (ref >60)
GFR calc non Af Amer: 52 mL/min — ABNORMAL LOW (ref >60)
Glucose, Bld: 112 mg/dL — ABNORMAL HIGH (ref 70–99)
Potassium: 4 mmol/L (ref 3.5–5.1)
Sodium: 142 mmol/L (ref 135–145)
Total Bilirubin: 0.6 mg/dL (ref 0.3–1.2)
Total Protein: 7 g/dL (ref 6.5–8.1)

## 2019-06-12 LAB — VITAMIN D 25 HYDROXY (VIT D DEFICIENCY, FRACTURES): Vit D, 25-Hydroxy: 52.64 ng/mL (ref 30–100)

## 2019-06-12 MED ORDER — ANASTROZOLE 1 MG PO TABS: 1.0000 mg | ORAL_TABLET | Freq: Every day | ORAL | 2 refills | Status: DC

## 2019-06-12 NOTE — Telephone Encounter (Signed)
Left vm for patient and her son informing of bone density appt scheduled for Monday 12/7 at 9:30 am.  Advised both that patient is to stop calcium and vitamin d 48 hours before appt.  Center number left on vm for call back with questions.

## 2019-06-12 NOTE — Progress Notes (Addendum)
[facility]  [phone] [phone]     ID: Cynthia Mathis DOB: [birth year]  MR#: [id number]  JGO#:[id number]  Patient Care Team: [name], MD as PCP - General (Family Medicine) [name], MD as PCP - Cardiology (Cardiology) [name], [name], MD as Consulting Physician (Oncology) [name], MD as Consulting Physician (General Surgery) [name], RN as Oncology Nurse Navigator [name], MD as Consulting Physician (Cardiology) Chauncey Cruel, MD OTHER MD:  CHIEF COMPLAINT: estrogen receptor positive breast cancer  CURRENT TREATMENT: Starting anastrozole  INTERVAL HISTORY: Cynthia Mathis is here today with her son Cynthia Mathis to review her pathology results from her lumpectomy completed on 05/14/2019 by Dr. Brantley Stage.  The results showed invasive ductal carcinoma, grade 1, 0.7cm, margins were negative, and she is ER/PR positive.  No lymph nodes were biopsied.  She has recovered from surgery well and is here to discuss future treatment/surveillance.   REVIEW OF SYSTEMS: Cynthia Mathis is doing well today.  Her breast is healing quite well from surgery.  She denies any pain.  She is not exercising because of right knee pain.  She otherwise has no issues such as fever, chills, chest pain, palpitations, cough, bowel/bladder changes, nausea, vomiting, or any other concerns.  A detailed ROS was otherwise non contributory.    HISTORY OF CURRENT ILLNESS: From the original intake note:  Cynthia Mathis had routine screening mammography at [facility] on 02/18/2019 showing a possible abnormality in the right breast. She underwent right diagnostic mammography with tomography and right breast ultrasonography at [facility] on 02/25/2019 showing: breast density category B; 0.6 cm irregular mass at 11 o'clock in the right breast; no abnormal right axillary lymph nodes.  Accordingly on 03/05/2019 she proceeded to biopsy of the right breast area in question.  The pathology from this procedure ([id number]) showed: invasive ductal carcinoma, grade 1. Prognostic indicators significant for: estrogen receptor, 95% positive and progesterone receptor, 95% positive, both with strong staining intensity. Proliferation marker Ki67 at 10%. HER2 negative by immunohistochemistry (1+).  The patient's subsequent history is as detailed below.  PAST MEDICAL HISTORY: Past Medical History:  Diagnosis Date  . Anxiety   . Borderline diabetes mellitus   . Cancer ([place]) 2020   breast  . Diverticulosis of colon   . DJD (degenerative joint disease)   . Dyslipidemia   . History of hematuria   . History of osteoporosis   . Hx of colonic polyps   . Hypertension   . Hypothyroid   . Macular degeneration    sees [name] for general eye care, sees [name] for retinal injections   . Memory loss   . Osteoporosis    took bisphosphonates, now off. Last DEXA in 2015 showed osteopenia   . Paroxysmal atrial fibrillation (HCC)    sees [name]     PAST SURGICAL HISTORY: Past Surgical History:  Procedure Laterality Date  . APPENDECTOMY  1947  . BREAST LUMPECTOMY WITH RADIOACTIVE SEED LOCALIZATION Right 05/14/2019   Procedure: RIGHT BREAST LUMPECTOMY WITH RADIOACTIVE SEED LOCALIZATION;  Surgeon: [name], MD;  Location: [facility];  Service: General;  Laterality: Right;  . COLONOSCOPY  12/02/2014   per [name], severe diverticulosis but no polyps   . MASS EXCISION  04/17/2012   Procedure: EXCISION MASS;  Surgeon: [name], MD;  Location: [facility];  Service: General;  Laterality: N/A;  . THYROIDECTOMY  1990   [name]    FAMILY HISTORY:  Family History  Problem Relation Age of Onset  . Stroke Mother   . Colon cancer Neg Hx   . Stomach cancer Neg Hx    Patient's father was 73 years old when he died in 17 War II. Patient's mother died from causes not clear to the patient at age 45.  The patient had 1 brother, no  sisters.  The patient denies/or notes a family hx of breast or ovarian cancer.     GYNECOLOGIC HISTORY:  No LMP recorded. Patient is postmenopausal. Menarche: 85 years old Age at first live birth: 85 years old [place] P 3 LMP early 53s Contraceptive no HRT no  Hysterectomy?  No BSO?  No   SOCIAL HISTORY: (updated September 2020)  Jaritza was born [place], in [place].  She worked with [place] family is in [place] and that is where she met her husband who was born in [place] but came to [place] as a young man and enlisted in Librarian, academic from [place] where he was living.  Cynthia Mathis today came to [place] states with him and after living in [place] for some time they moved to [place] where her husband had a job.  He was an Training and development officer.  Their 3 children are Cynthia Mathis 29 who lives in [place] and is an Futures trader; Cynthia Mathis 44 lives in [place] and keeps a day school in her home; and Cynthia Mathis in [place] who works with wood the patient has 7 grandchildren, no great grandchildren.  She is not a Publishing rights manager.  ADVANCED DIRECTIVES:    HEALTH MAINTENANCE: Social History   Tobacco Use  . Smoking status: Former Smoker    Quit date: 07/10/1965    Years since quitting: 53.9  . Smokeless tobacco: Never Used  . Tobacco comment: 3 cigs per week x 3 years (started in 1964), 12/27/15 does not smoke  Substance Use Topics  . Alcohol use: Yes    Alcohol/week: 3.0 standard drinks    Types: 3 Glasses of wine per week    Comment: occasionally  . Drug use: No     Colonoscopy: 2016/[facility]  PAP: Up-to-date  Bone density: 2015, normal   Allergies  Allergen Reactions  . Epinephrine Palpitations    heart racing    Current Outpatient Medications  Medication Sig Dispense Refill  . Calcium Carbonate-Vitamin D (CALCIUM 600+D) 600-400 MG-UNIT per tablet Take 1 tablet by mouth daily.    . Cyanocobalamin (VITAMIN B 12 PO) Take 1,000 mcg by mouth daily.     [name] donepezil (ARICEPT) 10 MG tablet  Take 1 tablet (10 mg total) by mouth at bedtime. 90 tablet 3  . fish oil-omega-3 fatty acids 1000 MG capsule Take 1 g by mouth daily.    [name] GLUCOSAMINE-CHONDROITIN PO Take 1 tablet by mouth 3 (three) times daily. 1500-1200    . hydrochlorothiazide (HYDRODIURIL) 25 MG tablet Take 1 tablet (25 mg total) by mouth daily. 90 tablet 3  . losartan (COZAAR) 100 MG tablet Take 1 tablet (100 mg total) by mouth daily. 90 tablet 3  . memantine (NAMENDA) 10 MG tablet Take 1 tablet (10 mg total) by mouth 2 (two) times daily. 180 tablet 3  . Multiple Vitamins-Minerals (MULTIVITAMIN & MINERAL PO) Take 1 tablet by mouth daily.    . verapamil (CALAN-SR) 240 MG CR tablet Take 1 tablet (240 mg total) by mouth at bedtime. Please make overdue appt with Dr. Meda Coffee before anymore refills. 1st attempt 90 tablet 3  . warfarin (COUMADIN) 5 MG tablet TAKE  BY MOUTH AS DIRECTED BY COUMADIN CLINIC (Patient taking differently: Take 2.5 mg by mouth one time only at 6 PM. ) 90 tablet 2  . anastrozole (ARIMIDEX) 1 MG tablet Take 1 tablet (1 mg total) by mouth daily. 30 tablet 2   No current facility-administered medications for this visit.     OBJECTIVE:   Vitals:   06/12/19 1348  BP: (!) 148/57  Pulse: 60  Resp: 17  Temp: 98 F (36.7 C)  SpO2: 98%     Body mass index is 26.14 kg/m.   Wt Readings from Last 3 Encounters:  06/12/19 142 lb 14.4 oz (64.8 kg)  05/09/19 140 lb 11.2 oz (63.8 kg)  03/31/19 140 lb (63.5 kg)      ECOG FS:1 - Symptomatic but completely ambulatory GENERAL: Patient is a well appearing female in no acute distress HEENT:  Sclerae anicteric.  Oropharynx clear and moist. No ulcerations or evidence of oropharyngeal candidiasis. Neck is supple.  NODES:  No cervical, supraclavicular, or axillary lymphadenopathy palpated.  BREAST EXAM:  Right breast s/p lumpectomy, mild swelling, no sign of local recurrence, left breast benign LUNGS:  Clear to auscultation bilaterally.  No wheezes or rhonchi. HEART:   Regular rate and rhythm. No murmur appreciated. ABDOMEN:  Soft, nontender.  Positive, normoactive bowel sounds. No organomegaly palpated. MSK:  No focal spinal tenderness to palpation. Full range of motion bilaterally in the upper extremities. EXTREMITIES:  No peripheral edema.   SKIN:  Clear with no obvious rashes or skin changes. No nail dyscrasia. NEURO:  Nonfocal. Well oriented.  Appropriate affect.    LAB RESULTS:  CMP     Component Value Date/Time   NA 142 06/12/2019 1329   NA 146 (H) 02/05/2019 0852   K 4.0 06/12/2019 1329   CL 103 06/12/2019 1329   CO2 32 06/12/2019 1329   GLUCOSE 112 (H) 06/12/2019 1329   BUN 17 06/12/2019 1329   BUN 18 02/05/2019 0852   CREATININE 1.00 06/12/2019 1329   CREATININE 1.06 (H) 03/26/2019 1521   CALCIUM 9.7 06/12/2019 1329   PROT 7.0 06/12/2019 1329   PROT 7.0 02/05/2019 0852   ALBUMIN 3.9 06/12/2019 1329   ALBUMIN 4.4 02/05/2019 0852   AST 24 06/12/2019 1329   AST 17 03/26/2019 1521   ALT 24 06/12/2019 1329   ALT 17 03/26/2019 1521   ALKPHOS 65 06/12/2019 1329   BILITOT 0.6 06/12/2019 1329   BILITOT 0.3 03/26/2019 1521   GFRNONAA 52 (L) 06/12/2019 1329   GFRNONAA 48 (L) 03/26/2019 1521   GFRAA 60 (L) 06/12/2019 1329   GFRAA 56 (L) 03/26/2019 1521    No results found for: TOTALPROTELP, ALBUMINELP, A1GS, A2GS, BETS, BETA2SER, GAMS, MSPIKE, SPEI  No results found for: Nils Pyle, [facility]  Lab Results  Component Value Date   WBC 9.5 06/12/2019   NEUTROABS 5.6 06/12/2019   HGB 13.3 06/12/2019   HCT 40.0 06/12/2019   MCV 91.7 06/12/2019   PLT 245 06/12/2019    '@LASTCHEMISTRY' @  No results found for: LABCA2  No components found for: TMHDQQ229  Recent Labs  Lab 06/11/19 1316  INR 2.4    No results found for: LABCA2  No results found for: NLG921  No results found for: JHE174  No results found for: YCX448  No results found for: CA2729  No components found for: HGQUANT  No results found for:  CEA1 / No results found for: CEA1   No results found for: AFPTUMOR  No results found for: [facility]  No results found for: PSA1  Appointment on 06/12/2019  Component Date Value Ref Range Status  . Vit D, 25-Hydroxy 06/12/2019 52.64  30 - 100 ng/mL Final   Comment: (NOTE) Vitamin D deficiency has been defined by the [place] practice guideline as a level of serum 25-OH  vitamin D less than 20 ng/mL (1,2). The Endocrine Society went on to  further define vitamin D insufficiency as a level between 21 and 29  ng/mL (2). 1. IOM (Institute of Medicine). 2010. Dietary reference intakes for  calcium and D. [place]: The [employer]. 2. Holick MF, Binkley [place], Bischoff-Ferrari HA, et al. Evaluation,  treatment, and prevention of vitamin D deficiency: an Endocrine  Society clinical practice guideline, JCEM. 2011 Jul; 96(7): 1911-30. Performed at [facility], [place] [address] [place], [place] [address]   . Sodium 06/12/2019 142  135 - 145 mmol/L Final  . Potassium 06/12/2019 4.0  3.5 - 5.1 mmol/L Final  . Chloride 06/12/2019 103  98 - 111 mmol/L Final  . CO2 06/12/2019 32  22 - 32 mmol/L Final  . Glucose, Bld 06/12/2019 112* 70 - 99 mg/dL Final  . BUN 06/12/2019 17  8 - 23 mg/dL Final  . Creatinine, Ser 06/12/2019 1.00  0.44 - 1.00 mg/dL Final  . Calcium 06/12/2019 9.7  8.9 - 10.3 mg/dL Final  . Total Protein 06/12/2019 7.0  6.5 - 8.1 g/dL Final  . Albumin 06/12/2019 3.9  3.5 - 5.0 g/dL Final  . AST 06/12/2019 24  15 - 41 U/L Final  . ALT 06/12/2019 24  0 - 44 U/L Final  . Alkaline Phosphatase 06/12/2019 65  38 - 126 U/L Final  . Total Bilirubin 06/12/2019 0.6  0.3 - 1.2 mg/dL Final  . GFR calc non Af Amer 06/12/2019 52* >60 mL/min Final  . GFR calc Af Amer 06/12/2019 60* >60 mL/min Final  . Anion gap 06/12/2019 7  5 - 15 Final   Performed at [facility], [place] [address] [place], [place] [address]   . WBC 06/12/2019 9.5  4.0 - 10.5 K/uL Final  . RBC 06/12/2019 4.36  3.87 - 5.11 MIL/uL Final  . Hemoglobin 06/12/2019 13.3  12.0 - 15.0 g/dL Final  . HCT 06/12/2019 40.0  36.0 - 46.0 % Final  . MCV 06/12/2019 91.7  80.0 - 100.0 fL Final  . MCH 06/12/2019 30.5  26.0 - 34.0 pg Final  . MCHC 06/12/2019 33.3  30.0 - 36.0 g/dL Final  . RDW 06/12/2019 12.7  11.5 - 15.5 % Final  . Platelets 06/12/2019 245  150 - 400 K/uL Final  . nRBC 06/12/2019 0.0  0.0 - 0.2 % Final  . Neutrophils Relative % 06/12/2019 59  % Final  . Neutro Abs 06/12/2019 5.6  1.7 - 7.7 K/uL Final  . Lymphocytes Relative 06/12/2019 29  % Final  . Lymphs Abs 06/12/2019 2.7  0.7 - 4.0 K/uL Final  . Monocytes Relative 06/12/2019 10  % Final  . Monocytes Absolute 06/12/2019 0.9  0.1 - 1.0 K/uL Final  . Eosinophils Relative 06/12/2019 1  % Final  . Eosinophils Absolute 06/12/2019 0.1  0.0 - 0.5 K/uL Final  . Basophils Relative 06/12/2019 1  % Final  . Basophils Absolute 06/12/2019 0.1  0.0 - 0.1 K/uL Final  . Immature Granulocytes 06/12/2019 0  % Final  . Abs Immature Granulocytes 06/12/2019 0.02  0.00 - 0.07 K/uL Final  Performed at [facility], [place] [address] [place], [place] [address]  Anti-coag visit on 06/11/2019  Component Date Value Ref Range Status  . INR 06/11/2019 2.4  2.0 - 3.0 Final    (this displays the last labs from the last 3 days)  No results found for: TOTALPROTELP, ALBUMINELP, A1GS, A2GS, BETS, BETA2SER, GAMS, MSPIKE, SPEI (this displays SPEP labs)  No results found for: KPAFRELGTCHN, LAMBDASER, KAPLAMBRATIO (kappa/lambda light chains)  No results found for: HGBA, HGBA2QUANT, HGBFQUANT, HGBSQUAN (Hemoglobinopathy evaluation)   No results found for: LDH  Lab Results  Component Value Date   IRON 72 05/15/2012   IRONPCTSAT 24.6 05/15/2012   (Iron and TIBC)  No results found for: FERRITIN  Urinalysis    Component Value Date/Time   COLORURINE LT YELLOW 11/12/2007  0945   APPEARANCEUR Clear 11/12/2007 0945   LABSPEC < OR = 1.005 11/12/2007 0945   PHURINE 5.5 11/12/2007 0945   GLUCOSEU NEGATIVE 11/12/2007 0945   BILIRUBINUR negative 02/12/2019 1056   KETONESUR NEGATIVE 11/12/2007 0945   PROTEINUR Negative 02/12/2019 1056   UROBILINOGEN 0.2 02/12/2019 1056   UROBILINOGEN 0.2 mg/dL 11/12/2007 0945   NITRITE negative 02/12/2019 1056   NITRITE Negative 11/12/2007 0945   LEUKOCYTESUR Negative 02/12/2019 1056     STUDIES: No results found.  ELIGIBLE FOR AVAILABLE RESEARCH PROTOCOL: no  ASSESSMENT: 85 y.o. [place] woman status post right breast upper inner quadrant biopsy 03/05/2019 for a clinical T1b N0, stage IA invasive ductal carcinoma, grade 1, estrogen and progesterone receptor positive, HER-2 not amplified, with an MIB-1 of 10%  (1) status post right lumpectomy with no sentinel lymph node sampling 05/14/2019 for a pT1b pNX, stage IA invasive ductal carcinoma, grade 1, with negative margins  (2) to start anastrozole after surgery (target date 05/11/2019)  (a) bone density November 2020  PLAN:  Shonteria is doing well today.  She met with myself and Dr. Jana Hakim and we reviewed her pathology results with her and her son steven in detail.  She has early stage breast cancer and her prognosis is quite good.  She does not require adjuvant radiation therapy.  We did recommend anti estrogen therapy with Anastrozole.  Risks and benefits were reviewed including arthralgias, hot flashes, vaginal dryness, and bone thinning.  I sent this into her pharmacy.  Cynthia Mathis is not currently exercising.  She has difficulty with her right knee and pain in it.  She has arthritis and bone on bone, and is receiving injections.  Dr. Jana Hakim recommended she consider having knee surgery so that she can walk and exercise.    We will see her back in 3 months for f/u.  My nurse is arranging bone density testing in the interim.  We discussed bone health in detail.  She  was recommended to continue with the appropriate pandemic precautions. She knows to call for any questions that may arise between now and her next appointment.  We are happy to see her sooner if needed.   Cynthia Bihari, NP   06/13/2019 4:13 PM Medical Oncology and Hematology [facility] [place],  [address] Tel. [phone]    Fax. [phone]   ADDENDUM: Cynthia Mathis did very well with her surgery and is ready to start antiestrogens.  She understands that even without antiestrogens and radiation her prognosis is very good.  Nevertheless particularly since we are trying to avoid radiation, it is safer for her to start antiestrogens.  We have discussed tamoxifen and anastrozole previously  and we reviewed that today.  She and her son have a good understanding of the possible toxicities side effects and complications of this agent.  She is starting it now.  I am hoping that she will tolerated well.  If so she will take it for 5 years.  We will have to pay close attention to her bone density.  This is to be reviewed with the scan prior to her next visit here  She knows to call for any other issues that may develop before the next visit.   I personally saw this patient and performed a substantive portion of this encounter with the listed APP documented above.   Chauncey Cruel, MD Medical Oncology and Hematology [facility] [address] [place], [place] [address] Tel. [phone]    Fax. [phone]

## 2019-06-12 NOTE — Telephone Encounter (Signed)
Where is she having this done?

## 2019-06-12 NOTE — Telephone Encounter (Signed)
I talk with patient regarding schedule  

## 2019-06-13 ENCOUNTER — Telehealth: Payer: Self-pay

## 2019-06-13 ENCOUNTER — Encounter: Payer: Self-pay | Admitting: *Deleted

## 2019-06-13 NOTE — Telephone Encounter (Signed)
Patient's appt is at [facility].

## 2019-06-16 ENCOUNTER — Ambulatory Visit
Admission: RE | Admit: 2019-06-16 | Discharge: 2019-06-16 | Disposition: A | Discharge status: Home / Self Care | Payer: Medicare Other | Source: Ambulatory Visit | Attending: Oncology | Admitting: Oncology

## 2019-06-16 ENCOUNTER — Other Ambulatory Visit: Payer: Self-pay

## 2019-06-16 DIAGNOSIS — I480 Paroxysmal atrial fibrillation: Secondary | ICD-10-CM

## 2019-06-16 DIAGNOSIS — C50211 Malignant neoplasm of upper-inner quadrant of right female breast: Secondary | ICD-10-CM

## 2019-06-16 DIAGNOSIS — Z780 Asymptomatic menopausal state: Secondary | ICD-10-CM | POA: Diagnosis not present

## 2019-06-16 DIAGNOSIS — M8589 Other specified disorders of bone density and structure, multiple sites: Secondary | ICD-10-CM

## 2019-06-16 DIAGNOSIS — Z170 Estrogen receptor positive status [ER+]: Secondary | ICD-10-CM

## 2019-06-16 DIAGNOSIS — G3184 Mild cognitive impairment, so stated: Secondary | ICD-10-CM

## 2019-06-25 DIAGNOSIS — M25561 Pain in right knee: Secondary | ICD-10-CM | POA: Diagnosis not present

## 2019-06-25 DIAGNOSIS — M1711 Unilateral primary osteoarthritis, right knee: Secondary | ICD-10-CM | POA: Diagnosis not present

## 2019-07-01 DIAGNOSIS — H353221 Exudative age-related macular degeneration, left eye, with active choroidal neovascularization: Secondary | ICD-10-CM | POA: Diagnosis not present

## 2019-07-01 DIAGNOSIS — H43812 Vitreous degeneration, left eye: Secondary | ICD-10-CM | POA: Diagnosis not present

## 2019-07-01 DIAGNOSIS — H353211 Exudative age-related macular degeneration, right eye, with active choroidal neovascularization: Secondary | ICD-10-CM | POA: Diagnosis not present

## 2019-07-09 ENCOUNTER — Ambulatory Visit: Payer: Medicare Other | Admitting: Pharmacist

## 2019-07-09 ENCOUNTER — Other Ambulatory Visit: Payer: Self-pay

## 2019-07-09 DIAGNOSIS — Z5181 Encounter for therapeutic drug level monitoring: Secondary | ICD-10-CM | POA: Diagnosis not present

## 2019-07-09 DIAGNOSIS — I480 Paroxysmal atrial fibrillation: Secondary | ICD-10-CM

## 2019-07-09 LAB — POCT INR: INR: 1.8 — AB (ref 2.0–3.0)

## 2019-07-09 NOTE — Patient Instructions (Signed)
Description   Take an extra 1/2 tablet today when you get home, then continue same dosage 1/2 tablet every day. Recheck INR in 3 weeks. Call with any medication changes or if scheduled for any procedures [phone].

## 2019-07-30 ENCOUNTER — Other Ambulatory Visit: Payer: Self-pay

## 2019-07-30 ENCOUNTER — Ambulatory Visit (INDEPENDENT_AMBULATORY_CARE_PROVIDER_SITE_OTHER): Payer: Medicare Other

## 2019-07-30 DIAGNOSIS — Z5181 Encounter for therapeutic drug level monitoring: Secondary | ICD-10-CM

## 2019-07-30 DIAGNOSIS — I480 Paroxysmal atrial fibrillation: Secondary | ICD-10-CM | POA: Diagnosis not present

## 2019-07-30 LAB — POCT INR: INR: 1.9 — AB (ref 2.0–3.0)

## 2019-07-30 NOTE — Patient Instructions (Signed)
Description   Take 1 tablet today, then start taking 1/2 tablet daily except 1 tablet on Mondays.  Recheck INR in 3 weeks. Call with any medication changes or if scheduled for any procedures [phone].

## 2019-08-05 DIAGNOSIS — H353211 Exudative age-related macular degeneration, right eye, with active choroidal neovascularization: Secondary | ICD-10-CM | POA: Diagnosis not present

## 2019-08-05 DIAGNOSIS — H35351 Cystoid macular degeneration, right eye: Secondary | ICD-10-CM | POA: Diagnosis not present

## 2019-08-20 ENCOUNTER — Ambulatory Visit (INDEPENDENT_AMBULATORY_CARE_PROVIDER_SITE_OTHER): Payer: Medicare Other | Admitting: *Deleted

## 2019-08-20 ENCOUNTER — Other Ambulatory Visit: Payer: Self-pay

## 2019-08-20 DIAGNOSIS — I480 Paroxysmal atrial fibrillation: Secondary | ICD-10-CM

## 2019-08-20 DIAGNOSIS — Z5181 Encounter for therapeutic drug level monitoring: Secondary | ICD-10-CM

## 2019-08-20 LAB — POCT INR: INR: 2.6 (ref 2.0–3.0)

## 2019-08-20 NOTE — Patient Instructions (Signed)
Description   Continue taking 1/2 tablet daily except 1 tablet on Mondays.  Recheck INR in 4 weeks. Call with any medication changes or if scheduled for any procedures [phone].

## 2019-08-24 ENCOUNTER — Ambulatory Visit: Payer: Medicare Other

## 2019-08-25 DIAGNOSIS — H43812 Vitreous degeneration, left eye: Secondary | ICD-10-CM | POA: Diagnosis not present

## 2019-08-25 DIAGNOSIS — H353133 Nonexudative age-related macular degeneration, bilateral, advanced atrophic without subfoveal involvement: Secondary | ICD-10-CM | POA: Diagnosis not present

## 2019-08-25 DIAGNOSIS — H353221 Exudative age-related macular degeneration, left eye, with active choroidal neovascularization: Secondary | ICD-10-CM | POA: Diagnosis not present

## 2019-08-29 ENCOUNTER — Ambulatory Visit: Payer: Medicare Other

## 2019-08-31 ENCOUNTER — Ambulatory Visit: Payer: Medicare Other | Attending: Internal Medicine

## 2019-08-31 DIAGNOSIS — Z23 Encounter for immunization: Secondary | ICD-10-CM | POA: Insufficient documentation

## 2019-08-31 NOTE — Progress Notes (Signed)
   Covid-19 Vaccination Clinic  Name:  Cynthia Mathis    MRN: [id number] DOB: [birth year]  08/31/2019  [name] was observed post Covid-19 immunization for 15 minutes without incidence. She was provided with Vaccine Information Sheet and instruction to access the V-Safe system.   [name] was instructed to call 911 with any severe reactions post vaccine: [name] Difficulty breathing  . Swelling of your face and throat  . A fast heartbeat  . A bad rash all over your body  . Dizziness and weakness    Immunizations Administered    Name Date Dose VIS Date Route   Pfizer COVID-19 Vaccine 08/31/2019  9:30 AM 0.3 mL 06/20/2019 Intramuscular   Manufacturer: [place]   Lot: [id number]   [place]: [id number]

## 2019-09-09 DIAGNOSIS — C50919 Malignant neoplasm of unspecified site of unspecified female breast: Secondary | ICD-10-CM | POA: Diagnosis not present

## 2019-09-09 NOTE — Progress Notes (Signed)
[place]  [phone] [phone]     ID: Cynthia Mathis DOB: [birth year]  MR#: [id number]  WCB#:[id number]  Patient Care Team: [name], MD as PCP - General (Family Medicine) [name], MD as PCP - Cardiology (Cardiology) Sotiria Keast, [name], MD as Consulting Physician (Oncology) [name], MD as Consulting Physician (General Surgery) [name], RN as Oncology Nurse Navigator [name], MD as Consulting Physician (Cardiology) Chauncey Cruel, MD OTHER MD:  CHIEF COMPLAINT: estrogen receptor positive breast cancer  CURRENT TREATMENT: anastrozole   INTERVAL HISTORY: [name] returns today for follow up of her estrogen receptor positive breast cancer accompanied by her son Cynthia Mathis.  She was started on anastrozole at her last visit on 06/12/2019.  She is tolerating this well, with no hot flashes or vaginal dryness issues.  She is obtaining it at a very good price.  Since her last visit, she underwent bone density screening on 06/16/2019. This showed a T-score of -0.7, which is considered normal.   REVIEW OF SYSTEMS: Rula is back to her normal activities.  She does her housework, and cooking.  She drives to a friend who lives nearby and visits.  She is having problems with her left knee and is going to be seeing Dr. Ihor Gully sometime to see if another shot (she has had 5) would be helpful.  She is interested in obtaining a second opinion.  She has already had her first COVID-19 shot and is scheduled for the second one soon.  A detailed review of systems today was otherwise stable.   HISTORY OF CURRENT ILLNESS: From the original intake note:  Cynthia Mathis had routine screening mammography at [facility] on 02/18/2019 showing a possible abnormality in the right breast. She underwent right diagnostic mammography with tomography and right breast ultrasonography at [facility] on 02/25/2019 showing: breast density category B;  0.6 cm irregular mass at 11 o'clock in the right breast; no abnormal right axillary lymph nodes.  Accordingly on 03/05/2019 she proceeded to biopsy of the right breast area in question. The pathology from this procedure ([id number]) showed: invasive ductal carcinoma, grade 1. Prognostic indicators significant for: estrogen receptor, 95% positive and progesterone receptor, 95% positive, both with strong staining intensity. Proliferation marker Ki67 at 10%. HER2 negative by immunohistochemistry (1+).  The patient's subsequent history is as detailed below.   PAST MEDICAL HISTORY: Past Medical History:  Diagnosis Date  . Anxiety   . Borderline diabetes mellitus   . Cancer ([place]) 2020   breast  . Diverticulosis of colon   . DJD (degenerative joint disease)   . Dyslipidemia   . History of hematuria   . History of osteoporosis   . Hx of colonic polyps   . Hypertension   . Hypothyroid   . Macular degeneration    sees [name] for general eye care, sees [name] for retinal injections   . Memory loss   . Osteoporosis    took bisphosphonates, now off. Last DEXA in 2015 showed osteopenia   . Paroxysmal atrial fibrillation (HCC)    sees [name]     PAST SURGICAL HISTORY: Past Surgical History:  Procedure Laterality Date  . APPENDECTOMY  1947  . BREAST LUMPECTOMY WITH RADIOACTIVE SEED LOCALIZATION Right 05/14/2019   Procedure: RIGHT BREAST LUMPECTOMY WITH RADIOACTIVE SEED LOCALIZATION;  Surgeon: [name], MD;  Location: [facility];  Service: General;  Laterality: Right;  . COLONOSCOPY  12/02/2014   per [name], severe  diverticulosis but no polyps   . MASS EXCISION  04/17/2012   Procedure: EXCISION MASS;  Surgeon: [name], MD;  Location: [facility];  Service: General;  Laterality: N/A;  . THYROIDECTOMY  1990   [name]    FAMILY HISTORY: Family History  Problem Relation Age of Onset  . Stroke Mother   . Colon cancer Neg Hx   . Stomach  cancer Neg Hx    Patient's father was 58 years old when he died in [place] War II. Patient's mother died from causes not clear to the patient at age 62.  The patient had 1 brother, no sisters.  The patient denies/or notes a family hx of breast or ovarian cancer.     GYNECOLOGIC HISTORY:  No LMP recorded. Patient is postmenopausal. Menarche: 86 years old Age at first live birth: 86 years old [address] P 3 LMP early 42s Contraceptive no HRT no  Hysterectomy?  No BSO?  No   SOCIAL HISTORY: (updated September 2020)  Cynthia Mathis was born in [place], in [place].  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  [name] came to the states with him and after living in [place] for some time they moved to [place] where her husband had a job.  He was an Training and development officer.  Their 3 children are Cynthia Mathis 62 who lives in [place] and is an Futures trader; Cynthia Mathis 84 lives in [place] and keeps a day school in her home; and Cynthia Mathis in [place] who works with [employer]. The patient has 7 grandchildren, no great grandchildren.  She is not a Ambulance person.   ADVANCED DIRECTIVES:    HEALTH MAINTENANCE: Social History   Tobacco Use  . Smoking status: Former Smoker    Quit date: 07/10/1965    Years since quitting: 54.2  . Smokeless tobacco: Never Used  . Tobacco comment: 3 cigs per week x 3 years (started in 1964), 12/27/15 does not smoke  Substance Use Topics  . Alcohol use: Yes    Alcohol/week: 3.0 standard drinks    Types: 3 Glasses of wine per week    Comment: occasionally  . Drug use: No     Colonoscopy: 2016/[facility]  PAP: Up-to-date  Bone density: 06/2019, -0.7   Allergies  Allergen Reactions  . Epinephrine Palpitations    heart racing    Current Outpatient Medications  Medication Sig Dispense Refill  . anastrozole (ARIMIDEX) 1 MG tablet  Take 1 tablet (1 mg total) by mouth daily. 30 tablet 2  . Calcium Carbonate-Vitamin D (CALCIUM 600+D) 600-400 MG-UNIT per tablet Take 1 tablet by mouth daily.    . Cyanocobalamin (VITAMIN B 12 PO) Take 1,000 mcg by mouth daily.     [name] donepezil (ARICEPT) 10 MG tablet Take 1 tablet (10 mg total) by mouth at bedtime. 90 tablet 3  . fish oil-omega-3 fatty acids 1000 MG capsule Take 1 g by mouth daily.    [name] GLUCOSAMINE-CHONDROITIN PO Take 1 tablet by mouth 3 (three) times daily. 1500-1200    . hydrochlorothiazide (HYDRODIURIL) 25 MG tablet Take 1 tablet (25 mg total) by mouth daily. 90 tablet 3  . losartan (COZAAR) 100 MG tablet Take 1 tablet (100 mg total) by mouth daily. 90 tablet 3  . memantine (NAMENDA) 10 MG tablet Take 1 tablet (10 mg total)  by mouth 2 (two) times daily. 180 tablet 3  . Multiple Vitamins-Minerals (MULTIVITAMIN & MINERAL PO) Take 1 tablet by mouth daily.    . verapamil (CALAN-SR) 240 MG CR tablet Take 1 tablet (240 mg total) by mouth at bedtime. Please make overdue appt with Dr. Meda Coffee before anymore refills. 1st attempt 90 tablet 3  . warfarin (COUMADIN) 5 MG tablet TAKE BY MOUTH AS DIRECTED BY COUMADIN CLINIC (Patient taking differently: Take 2.5 mg by mouth one time only at 6 PM. ) 90 tablet 2   No current facility-administered medications for this visit.    OBJECTIVE: Older white woman who appears younger than stated age  27:   09/10/19 1404  BP: (!) 152/62  Pulse: (!) 59  Resp: 18  Temp: 98 F (36.7 C)  SpO2: 98%     Body mass index is 26.3 kg/m.   Wt Readings from Last 3 Encounters:  09/10/19 143 lb 12.8 oz (65.2 kg)  06/12/19 142 lb 14.4 oz (64.8 kg)  05/09/19 140 lb 11.2 oz (63.8 kg)      ECOG FS:1 - Symptomatic but completely ambulatory  Sclerae unicteric, EOMs intact Wearing a mask No cervical or supraclavicular adenopathy Lungs no rales or rhonchi Heart regular rate and rhythm Abd soft, nontender, positive bowel sounds MSK no focal spinal  tenderness, no upper extremity lymphedema Neuro: nonfocal, well oriented, appropriate affect Breasts: The right breast is status post lumpectomy (no radiation).  There is no evidence of local recurrence.  The cosmetic result is excellent.  Left breast is benign.  Both axillae are benign.   LAB RESULTS:  CMP     Component Value Date/Time   NA 142 09/10/2019 1344   NA 146 (H) 02/05/2019 0852   K 3.7 09/10/2019 1344   CL 105 09/10/2019 1344   CO2 26 09/10/2019 1344   GLUCOSE 92 09/10/2019 1344   BUN 20 09/10/2019 1344   BUN 18 02/05/2019 0852   CREATININE 0.96 09/10/2019 1344   CREATININE 1.06 (H) 03/26/2019 1521   CALCIUM 9.6 09/10/2019 1344   PROT 7.2 09/10/2019 1344   PROT 7.0 02/05/2019 0852   ALBUMIN 4.1 09/10/2019 1344   ALBUMIN 4.4 02/05/2019 0852   AST 22 09/10/2019 1344   AST 17 03/26/2019 1521   ALT 23 09/10/2019 1344   ALT 17 03/26/2019 1521   ALKPHOS 71 09/10/2019 1344   BILITOT 0.6 09/10/2019 1344   BILITOT 0.3 03/26/2019 1521   GFRNONAA 54 (L) 09/10/2019 1344   GFRNONAA 48 (L) 03/26/2019 1521   GFRAA >60 09/10/2019 1344   GFRAA 56 (L) 03/26/2019 1521    No results found for: TOTALPROTELP, ALBUMINELP, A1GS, A2GS, BETS, BETA2SER, GAMS, MSPIKE, SPEI  No results found for: KPAFRELGTCHN, LAMBDASER, KAPLAMBRATIO  Lab Results  Component Value Date   WBC 10.4 09/10/2019   NEUTROABS 6.3 09/10/2019   HGB 13.9 09/10/2019   HCT 40.3 09/10/2019   MCV 89.6 09/10/2019   PLT 244 09/10/2019   No results found for: LABCA2  No components found for: NIOEVO350  No results for input(s): INR in the last 168 hours.  No results found for: LABCA2  No results found for: KXF818  No results found for: EXH371  No results found for: IRC789  No results found for: CA2729  No components found for: HGQUANT  No results found for: CEA1 / No results found for: CEA1   No results found for: AFPTUMOR  No results found for: CHROMOGRNA  No results found for: HGBA,  HGBA2QUANT,  HGBFQUANT, HGBSQUAN (Hemoglobinopathy evaluation)   No results found for: LDH  Lab Results  Component Value Date   IRON 72 05/15/2012   IRONPCTSAT 24.6 05/15/2012   (Iron and TIBC)  No results found for: FERRITIN  Urinalysis    Component Value Date/Time   COLORURINE LT YELLOW 11/12/2007 0945   APPEARANCEUR Clear 11/12/2007 0945   LABSPEC < OR = 1.005 11/12/2007 0945   PHURINE 5.5 11/12/2007 0945   GLUCOSEU NEGATIVE 11/12/2007 0945   BILIRUBINUR negative 02/12/2019 1056   KETONESUR NEGATIVE 11/12/2007 0945   PROTEINUR Negative 02/12/2019 1056   UROBILINOGEN 0.2 02/12/2019 1056   UROBILINOGEN 0.2 mg/dL 11/12/2007 0945   NITRITE negative 02/12/2019 1056   NITRITE Negative 11/12/2007 0945   LEUKOCYTESUR Negative 02/12/2019 1056    STUDIES: No results found.   ELIGIBLE FOR AVAILABLE RESEARCH PROTOCOL: no  ASSESSMENT: 86 y.o. [place] woman status post right breast upper inner quadrant biopsy 03/05/2019 for a clinical T1b N0, stage IA invasive ductal carcinoma, grade 1, estrogen and progesterone receptor positive, HER-2 not amplified, with an MIB-1 of 10%  (1) status post right lumpectomy with no sentinel lymph node sampling 05/14/2019 for a pT1b pNX, stage IA invasive ductal carcinoma, grade 1, with negative margins  (2) started anastrozole November 2020  (a) bone density 06/16/2019 normal (T = -0.7)   PLAN: Freeda is doing well on anastrozole and the plan will be to continue that a total of 5 years.  Her bone density is very favorable.  She is on vitamin D and calcium supplementation and is normally active for age.  I forgot to clarify her healthcare power of attorney decisions but will review that next visit.  She is interested in a second opinion regarding left knee management and I have placed a referral for her.  In the meantime though she will continue to be treated by Dr. Alvan Dame.  She will have her next mammogram in August.  She will return to  see me shortly after that.  She knows to call for any other issue that may develop before then.  Total encounter time 20 minutes.Sarajane Jews C. Vannary Greening, MD  09/10/2019 2:30 PM Medical Oncology and Hematology [facility] [address], [place] [address] Tel. [phone]    Fax. [phone]   I, Wilburn Mylar, am acting as scribe for Dr. [name]. Taisia Fantini.  I, [name] MD, have reviewed the above documentation for accuracy and completeness, and I agree with the above.    *Total Encounter Time as defined by the Centers for Medicare and Medicaid Services includes, in addition to the face-to-face time of a patient visit (documented in the note above) non-face-to-face time: obtaining and reviewing outside history, ordering and reviewing medications, tests or procedures, care coordination (communications with other health care professionals or caregivers) and documentation in the medical record.

## 2019-09-10 ENCOUNTER — Other Ambulatory Visit: Payer: Self-pay

## 2019-09-10 ENCOUNTER — Inpatient Hospital Stay: Payer: Medicare Other

## 2019-09-10 ENCOUNTER — Inpatient Hospital Stay: Payer: Medicare Other | Attending: Oncology | Admitting: Oncology

## 2019-09-10 VITALS — BP 152/62 | HR 59 | Temp 98.0°F | Resp 18 | Ht 62.0 in | Wt 143.8 lb

## 2019-09-10 DIAGNOSIS — C50411 Malignant neoplasm of upper-outer quadrant of right female breast: Secondary | ICD-10-CM

## 2019-09-10 DIAGNOSIS — Z170 Estrogen receptor positive status [ER+]: Secondary | ICD-10-CM | POA: Diagnosis not present

## 2019-09-10 DIAGNOSIS — I10 Essential (primary) hypertension: Secondary | ICD-10-CM | POA: Diagnosis not present

## 2019-09-10 DIAGNOSIS — Z79899 Other long term (current) drug therapy: Secondary | ICD-10-CM | POA: Diagnosis not present

## 2019-09-10 DIAGNOSIS — M8589 Other specified disorders of bone density and structure, multiple sites: Secondary | ICD-10-CM

## 2019-09-10 DIAGNOSIS — Z86010 Personal history of colonic polyps: Secondary | ICD-10-CM | POA: Insufficient documentation

## 2019-09-10 DIAGNOSIS — M1990 Unspecified osteoarthritis, unspecified site: Secondary | ICD-10-CM

## 2019-09-10 DIAGNOSIS — Z79811 Long term (current) use of aromatase inhibitors: Secondary | ICD-10-CM | POA: Diagnosis not present

## 2019-09-10 DIAGNOSIS — C50211 Malignant neoplasm of upper-inner quadrant of right female breast: Secondary | ICD-10-CM | POA: Insufficient documentation

## 2019-09-10 DIAGNOSIS — E785 Hyperlipidemia, unspecified: Secondary | ICD-10-CM | POA: Diagnosis not present

## 2019-09-10 DIAGNOSIS — M810 Age-related osteoporosis without current pathological fracture: Secondary | ICD-10-CM | POA: Diagnosis not present

## 2019-09-10 DIAGNOSIS — I480 Paroxysmal atrial fibrillation: Secondary | ICD-10-CM

## 2019-09-10 DIAGNOSIS — F419 Anxiety disorder, unspecified: Secondary | ICD-10-CM | POA: Diagnosis not present

## 2019-09-10 DIAGNOSIS — Z87891 Personal history of nicotine dependence: Secondary | ICD-10-CM | POA: Diagnosis not present

## 2019-09-10 DIAGNOSIS — E039 Hypothyroidism, unspecified: Secondary | ICD-10-CM | POA: Diagnosis not present

## 2019-09-10 DIAGNOSIS — G3184 Mild cognitive impairment, so stated: Secondary | ICD-10-CM

## 2019-09-10 DIAGNOSIS — H3530 Unspecified macular degeneration: Secondary | ICD-10-CM

## 2019-09-10 DIAGNOSIS — E038 Other specified hypothyroidism: Secondary | ICD-10-CM

## 2019-09-10 LAB — CBC WITH DIFFERENTIAL/PLATELET
Abs Immature Granulocytes: 0.04 10*3/uL (ref 0.00–0.07)
Basophils Absolute: 0.1 10*3/uL (ref 0.0–0.1)
Basophils Relative: 1 %
Eosinophils Absolute: 0.1 10*3/uL (ref 0.0–0.5)
Eosinophils Relative: 1 %
HCT: 40.3 % (ref 36.0–46.0)
Hemoglobin: 13.9 g/dL (ref 12.0–15.0)
Immature Granulocytes: 0 %
Lymphocytes Relative: 29 %
Lymphs Abs: 3 10*3/uL (ref 0.7–4.0)
MCH: 30.9 pg (ref 26.0–34.0)
MCHC: 34.5 g/dL (ref 30.0–36.0)
MCV: 89.6 fL (ref 80.0–100.0)
Monocytes Absolute: 0.9 10*3/uL (ref 0.1–1.0)
Monocytes Relative: 9 %
Neutro Abs: 6.3 10*3/uL (ref 1.7–7.7)
Neutrophils Relative %: 60 %
Platelets: 244 10*3/uL (ref 150–400)
RBC: 4.5 MIL/uL (ref 3.87–5.11)
RDW: 13 % (ref 11.5–15.5)
WBC: 10.4 10*3/uL (ref 4.0–10.5)
nRBC: 0 % (ref 0.0–0.2)

## 2019-09-10 LAB — COMPREHENSIVE METABOLIC PANEL
ALT: 23 U/L (ref 0–44)
AST: 22 U/L (ref 15–41)
Albumin: 4.1 g/dL (ref 3.5–5.0)
Alkaline Phosphatase: 71 U/L (ref 38–126)
Anion gap: 11 (ref 5–15)
BUN: 20 mg/dL (ref 8–23)
CO2: 26 mmol/L (ref 22–32)
Calcium: 9.6 mg/dL (ref 8.9–10.3)
Chloride: 105 mmol/L (ref 98–111)
Creatinine, Ser: 0.96 mg/dL (ref 0.44–1.00)
GFR calc Af Amer: >60 mL/min (ref >60)
GFR calc non Af Amer: 54 mL/min — ABNORMAL LOW (ref >60)
Glucose, Bld: 92 mg/dL (ref 70–99)
Potassium: 3.7 mmol/L (ref 3.5–5.1)
Sodium: 142 mmol/L (ref 135–145)
Total Bilirubin: 0.6 mg/dL (ref 0.3–1.2)
Total Protein: 7.2 g/dL (ref 6.5–8.1)

## 2019-09-11 ENCOUNTER — Telehealth: Payer: Self-pay | Admitting: Oncology

## 2019-09-11 NOTE — Telephone Encounter (Signed)
I left a message regarding schedule  

## 2019-09-12 DIAGNOSIS — M1711 Unilateral primary osteoarthritis, right knee: Secondary | ICD-10-CM | POA: Diagnosis not present

## 2019-09-17 ENCOUNTER — Ambulatory Visit (INDEPENDENT_AMBULATORY_CARE_PROVIDER_SITE_OTHER): Payer: Medicare Other | Admitting: Pharmacist

## 2019-09-17 ENCOUNTER — Other Ambulatory Visit: Payer: Self-pay

## 2019-09-17 DIAGNOSIS — Z5181 Encounter for therapeutic drug level monitoring: Secondary | ICD-10-CM | POA: Diagnosis not present

## 2019-09-17 DIAGNOSIS — I480 Paroxysmal atrial fibrillation: Secondary | ICD-10-CM

## 2019-09-17 LAB — POCT INR: INR: 2.6 (ref 2.0–3.0)

## 2019-09-17 NOTE — Patient Instructions (Signed)
Description   Continue taking 1/2 tablet daily except 1 tablet on Wednesdays.  Recheck INR in 4 weeks. Call with any medication changes or if scheduled for any procedures [phone].

## 2019-09-24 ENCOUNTER — Ambulatory Visit: Payer: Medicare Other | Attending: Internal Medicine

## 2019-09-24 DIAGNOSIS — Z23 Encounter for immunization: Secondary | ICD-10-CM

## 2019-09-24 NOTE — Progress Notes (Signed)
   Covid-19 Vaccination Clinic  Name:  Cynthia Mathis    MRN: [id number] DOB: 1932-11-03  09/24/2019  Ms. Landers was observed post Covid-19 immunization for 15 minutes without incident. She was provided with Vaccine Information Sheet and instruction to access the V-Safe system.   Ms. Ching was instructed to call 911 with any severe reactions post vaccine: [name] Difficulty breathing  . Swelling of face and throat  . A fast heartbeat  . A bad rash all over body  . Dizziness and weakness   Immunizations Administered    Name Date Dose VIS Date Route   Pfizer COVID-19 Vaccine 09/24/2019  8:32 AM 0.3 mL 06/20/2019 Intramuscular   Manufacturer: [place]   Lot: [id number]   [place]: [id number]

## 2019-09-24 NOTE — Telephone Encounter (Signed)
Cynthia Spark, MD  Nuala Alpha, LPN  Karlene Einstein,  Please bring her on 3/24 at 11 am  Thank you,  K    Mychart message sent to the pts Son stating that we have scheduled her to come in and see Dr. Meda Coffee for 10/01/19 at 36 am. Advised the Son to call the office back or mychart me back if this date and time doesn't work for them.

## 2019-10-01 ENCOUNTER — Other Ambulatory Visit: Payer: Self-pay

## 2019-10-01 ENCOUNTER — Ambulatory Visit: Payer: Medicare Other | Admitting: Cardiology

## 2019-10-01 ENCOUNTER — Encounter: Payer: Self-pay | Admitting: Cardiology

## 2019-10-01 VITALS — BP 140/88 | HR 78 | Ht 62.0 in | Wt 143.4 lb

## 2019-10-01 DIAGNOSIS — E785 Hyperlipidemia, unspecified: Secondary | ICD-10-CM

## 2019-10-01 DIAGNOSIS — I10 Essential (primary) hypertension: Secondary | ICD-10-CM | POA: Diagnosis not present

## 2019-10-01 DIAGNOSIS — I480 Paroxysmal atrial fibrillation: Secondary | ICD-10-CM | POA: Diagnosis not present

## 2019-10-01 DIAGNOSIS — Z7901 Long term (current) use of anticoagulants: Secondary | ICD-10-CM

## 2019-10-01 NOTE — Patient Instructions (Signed)
Medication Instructions:   STOP TAKING ATORVASTATIN NOW  *If you need a refill on your cardiac medications before your next appointment, please call your pharmacy*   Follow-Up: At [facility], you and your health needs are our priority.  As part of our continuing mission to provide you with exceptional heart care, we have created designated Provider Care Teams.  These Care Teams include your primary Cardiologist (physician) and Advanced Practice Providers (APPs -  Physician Assistants and Nurse Practitioners) who all work together to provide you with the care you need, when you need it.  We recommend signing up for the patient portal called "MyChart".  Sign up information is provided on this After Visit Summary.  MyChart is used to connect with patients for Virtual Visits (Telemedicine).  Patients are able to view lab/test results, encounter notes, upcoming appointments, etc.  Non-urgent messages can be sent to your provider as well.   To learn more about what you can do with MyChart, go to [email or web address].    Your next appointment:   6 month(s)  The format for your next appointment:   In Person  Provider:   Ena Dawley, MD

## 2019-10-01 NOTE — Progress Notes (Signed)
Cardiology Office Note    Date:  10/01/2019   ID:  Cynthia Mathis, DOB 1933/04/08, MRN [id number]  PCP:  [name], MD  Cardiologist:  Ena Dawley, MD  Referring physician: Noralee Space, MD Reason for visit: 6 months follow-up  History of Present Illness:  Cynthia Mathis is a 86 y.o. female with h/o DM, hyperlipidemia, hypertension who presented to the ER on 06/29/2016 with palpitations and was diagnosed with new onset atrial fibrillation with RVR - ventricular rate 140 BPM. The patient was symptomatic with dizziness and felt the onset of atrial fibrillation. While in the ER she spontaneously cardioverted into the SR and was discharged home. CHADVASC score: 5. She was started on Eliquis and reports no bleeding. No palpitations since then.   03/31/2019 -the patient is coming after 1 year, she seems depressed, her husband passed in December unexpectedly at home seconds of heart attack.  She has been unable to play her favorite pickle ball because of knee pain.  She denies any chest pain shortness of breath no palpitation, no lower extremity edema.  10/01/2019 patient is coming after 6 months, but the patient is going through very difficult.,  She has recently lost her lifelong husband in December 2020, was also my patient.  She lives alone but has support of her family especially her son.  She denies any palpitations dizziness or falls, she has occasional dizziness when she first gets up in the morning.  No syncope.  She complains of progressively worsening memory impairment, she has not been experiencing any chest pain shortness of breath or lower extremity edema.  Past Medical History:  Diagnosis Date  . Anxiety   . Borderline diabetes mellitus   . Cancer ([facility]) 2020   breast  . Diverticulosis of colon   . DJD (degenerative joint disease)   . Dyslipidemia   . History of hematuria   . History of osteoporosis   . Hx of colonic polyps   . Hypertension   . Hypothyroid   .  Macular degeneration    sees [name] for general eye care, sees [name] for retinal injections   . Memory loss   . Osteoporosis    took bisphosphonates, now off. Last DEXA in 2015 showed osteopenia   . Paroxysmal atrial fibrillation ([facility])    sees [name]    Past Surgical History:  Procedure Laterality Date  . APPENDECTOMY  1947  . BREAST LUMPECTOMY WITH RADIOACTIVE SEED LOCALIZATION Right 05/14/2019   Procedure: RIGHT BREAST LUMPECTOMY WITH RADIOACTIVE SEED LOCALIZATION;  Surgeon: [name], MD;  Location: [facility];  Service: General;  Laterality: Right;  . COLONOSCOPY  12/02/2014   per [name], severe diverticulosis but no polyps   . MASS EXCISION  04/17/2012   Procedure: EXCISION MASS;  Surgeon: [name], MD;  Location: [facility];  Service: General;  Laterality: N/A;  . THYROIDECTOMY  1990   [name]   Current Medications: Outpatient Medications Prior to Visit  Medication Sig Dispense Refill  . anastrozole (ARIMIDEX) 1 MG tablet Take 1 tablet (1 mg total) by mouth daily. 30 tablet 2  . Calcium Carbonate-Vitamin D (CALCIUM 600+D) 600-400 MG-UNIT per tablet Take 1 tablet by mouth daily.    . Cyanocobalamin (VITAMIN B 12 PO) Take 1,000 mcg by mouth daily.     [name] donepezil (ARICEPT) 10 MG tablet Take 1 tablet (10 mg total) by mouth at bedtime. 90 tablet 3  . fish oil-omega-3 fatty  acids 1000 MG capsule Take 1 g by mouth daily.    [name] GLUCOSAMINE-CHONDROITIN PO Take 1 tablet by mouth 3 (three) times daily. 1500-1200    . hydrochlorothiazide (HYDRODIURIL) 25 MG tablet Take 1 tablet (25 mg total) by mouth daily. 90 tablet 3  . losartan (COZAAR) 100 MG tablet Take 1 tablet (100 mg total) by mouth daily. 90 tablet 3  . memantine (NAMENDA) 10 MG tablet Take 1 tablet (10 mg total) by mouth 2 (two) times daily. 180 tablet 3  . Multiple Vitamins-Minerals (MULTIVITAMIN & MINERAL PO) Take 1 tablet by mouth daily.    . verapamil (CALAN-SR) 240 MG CR  tablet Take 1 tablet (240 mg total) by mouth at bedtime. Please make overdue appt with Dr. Meda Coffee before anymore refills. 1st attempt 90 tablet 3  . warfarin (COUMADIN) 5 MG tablet TAKE BY MOUTH AS DIRECTED BY COUMADIN CLINIC 90 tablet 2  . atorvastatin (LIPITOR) 20 MG tablet Take 20 mg by mouth daily.     No facility-administered medications prior to visit.    Allergies:   Epinephrine   Social History   Socioeconomic History  . Marital status: Widowed    Spouse name: Cynthia Mathis  . Number of children: 3  . Years of education: [employer]  . Highest education level: Not on file  Occupational History  . Occupation: Education officer, environmental: RETIRED  Tobacco Use  . Smoking status: Former Smoker    Quit date: 07/10/1965    Years since quitting: 54.2  . Smokeless tobacco: Never Used  . Tobacco comment: 3 cigs per week x 3 years (started in 1964), 12/27/15 does not smoke  Substance and Sexual Activity  . Alcohol use: Yes    Alcohol/week: 3.0 standard drinks    Types: 3 Glasses of wine per week    Comment: occasionally  . Drug use: No  . Sexual activity: Not on file  Other Topics Concern  . Not on file  Social History Narrative   03/26/19 Pt lives at home alone now, widow.   her spouse passed Dec 2019 Cynthia Masters [place], who has been dx'd with mild cognitive impairment vs. mild dementia, former pt of [name]). She does not use caffeine, if so, rarely.   Social Determinants of Health   Financial Resource Strain:   . Difficulty of Paying Living Expenses:   Food Insecurity:   . Worried About Charity fundraiser in the Last Year:   . Arboriculturist in the Last Year:   Transportation Needs: No Transportation Needs  . Lack of Transportation (Medical): No  . Lack of Transportation (Non-Medical): No  Physical Activity:   . Days of Exercise per Week:   . Minutes of Exercise per Session:   Stress:   . Feeling of Stress :   Social Connections:   . Frequency of Communication with Friends  and Family:   . Frequency of Social Gatherings with Friends and Family:   . Attends Religious Services:   . Active Member of Clubs or Organizations:   . Attends Archivist Meetings:   [name] Marital Status:     Family History:  The patient's family history includes Stroke in her mother.   ROS:   Please see the history of present illness.    ROS All other systems reviewed and are negative.  PHYSICAL EXAM:   VS:  BP 140/88   Pulse 78   Ht 5\' 2"  (1.575 m)   Wt 143 lb  6.4 oz (65 kg)   SpO2 97%   BMI 26.23 kg/m    GEN: Well nourished, well developed, in no acute distress  HEENT: normal  Neck: no JVD, carotid bruits, or masses Cardiac: RRR; no murmurs, rubs, or gallops,no edema, poor peripheral pulses Respiratory:  clear to auscultation bilaterally, normal work of breathing GI: soft, nontender, nondistended, + BS MS: no deformity or atrophy  Skin: warm and dry, no rash Neuro:  Alert and Oriented x 3, Strength and sensation are intact Psych: euthymic mood, full affect  Wt Readings from Last 3 Encounters:  10/01/19 143 lb 6.4 oz (65 kg)  09/10/19 143 lb 12.8 oz (65.2 kg)  06/12/19 142 lb 14.4 oz (64.8 kg)    Studies/Labs Reviewed:   EKG: Is not ordered today  Recent Labs: 02/12/2019: TSH 0.32 09/10/2019: ALT 23; BUN 20; Creatinine, Ser 0.96; Hemoglobin 13.9; Platelets 244; Potassium 3.7; Sodium 142   Lipid Panel    Component Value Date/Time   CHOL 196 02/05/2019 0852   TRIG 186 (H) 02/05/2019 0852   HDL 50 02/05/2019 0852   CHOLHDL 3.9 02/05/2019 0852   CHOLHDL 4 02/13/2017 0905   VLDL 31.4 02/13/2017 0905   LDLCALC 109 (H) 02/05/2019 0852   Additional studies/ records that were reviewed today include:   TTE: 09/2016 - Left ventricle: The cavity size was normal. There was moderate   focal basal hypertrophy of the septum with mild posterior wall   hypertrophy. Systolic function was normal. The estimated ejection   fraction was in the range of 60% to 65%. Wall  motion was normal;   there were no regional wall motion abnormalities. Doppler   parameters are consistent with abnormal left ventricular   relaxation (grade 1 diastolic dysfunction). Doppler parameters   are consistent with indeterminate ventricular filling pressure. - Aortic valve: Transvalvular velocity was within the normal range.   There was no stenosis. There was mild regurgitation. - Right ventricle: The cavity size was normal. Wall thickness was   normal. Systolic function was normal. - Atrial septum: No defect or patent foramen ovale was identified   by color flow Doppler. - Tricuspid valve: There was mild regurgitation. - Pulmonary arteries: Systolic pressure was within the normal   range. PA peak pressure: 33 mm Hg (S).   ASSESSMENT:    1. Essential hypertension   2. PAF (paroxysmal atrial fibrillation) ([facility])   3. Hyperlipidemia, unspecified hyperlipidemia type   4. Chronic anticoagulation    PLAN:  In order of problems listed above:  1. Paroxysmal atrial fibrillation  -no recent episode, no palpitations, no bleeding on Coumadin, EKG today shows sinus rhythm.   2. Hypertension  -she is advised to watch low-sodium diet increase liquids and her diet and start daily walking.  Her blood pressure today is elevated but she describes episodes of orthostatic hypotension in the morning, I have asked her son to document some of the blood pressures when she feels dizzy in the morning.  For now I am not going to change her blood pressure medication regimen. 3. Hyperlipidemia  -we will discontinue her statin given progressively worsening memory impairment.  You fish oil only. 4. Status post breast surgery in October 2020, status post lumpectomy to turn out to be benign lesion. 5. Depression and memory impairment, the patient is currently on Namenda and Aricept, her son is advised to follow-up with her primary care physician, I feel like her depression worsened after she lost her husband  and this might be contributing  to her memory impairment.   Medication Adjustments/Labs and Tests Ordered: Current medicines are reviewed at length with the patient today.  Concerns regarding medicines are outlined above.  Medication changes, Labs and Tests ordered today are listed in the Patient Instructions below. Patient Instructions  Medication Instructions:   STOP TAKING ATORVASTATIN NOW  *If you need a refill on your cardiac medications before your next appointment, please call your pharmacy*   Follow-Up: At [facility], you and your health needs are our priority.  As part of our continuing mission to provide you with exceptional heart care, we have created designated Provider Care Teams.  These Care Teams include your primary Cardiologist (physician) and Advanced Practice Providers (APPs -  Physician Assistants and Nurse Practitioners) who all work together to provide you with the care you need, when you need it.  We recommend signing up for the patient portal called "MyChart".  Sign up information is provided on this After Visit Summary.  MyChart is used to connect with patients for Virtual Visits (Telemedicine).  Patients are able to view lab/test results, encounter notes, upcoming appointments, etc.  Non-urgent messages can be sent to your provider as well.   To learn more about what you can do with MyChart, go to [email or web address].    Your next appointment:   6 month(s)  The format for your next appointment:   In Person  Provider:   Ena Dawley, MD       Signed, Ena Dawley, MD  10/01/2019 4:27 PM    [address], [place], [place]  [address] Phone: [phone]; Fax: [phone]

## 2019-10-02 DIAGNOSIS — H3562 Retinal hemorrhage, left eye: Secondary | ICD-10-CM | POA: Diagnosis not present

## 2019-10-02 DIAGNOSIS — H353133 Nonexudative age-related macular degeneration, bilateral, advanced atrophic without subfoveal involvement: Secondary | ICD-10-CM | POA: Diagnosis not present

## 2019-10-02 DIAGNOSIS — H353211 Exudative age-related macular degeneration, right eye, with active choroidal neovascularization: Secondary | ICD-10-CM | POA: Diagnosis not present

## 2019-10-02 DIAGNOSIS — H353221 Exudative age-related macular degeneration, left eye, with active choroidal neovascularization: Secondary | ICD-10-CM | POA: Diagnosis not present

## 2019-10-02 DIAGNOSIS — H26492 Other secondary cataract, left eye: Secondary | ICD-10-CM | POA: Diagnosis not present

## 2019-10-16 ENCOUNTER — Other Ambulatory Visit: Payer: Self-pay

## 2019-10-16 ENCOUNTER — Ambulatory Visit: Payer: Medicare Other | Admitting: Pharmacist

## 2019-10-16 DIAGNOSIS — Z5181 Encounter for therapeutic drug level monitoring: Secondary | ICD-10-CM

## 2019-10-16 DIAGNOSIS — I480 Paroxysmal atrial fibrillation: Secondary | ICD-10-CM | POA: Diagnosis not present

## 2019-10-16 LAB — POCT INR: INR: 1.7 — AB (ref 2.0–3.0)

## 2019-10-16 NOTE — Patient Instructions (Signed)
Description   Take an extra 1/2 tablet today, then continue taking 1/2 tablet daily except 1 tablet on Wednesdays.  Recheck INR in 3 weeks. Call with any medication changes or if scheduled for any procedures [phone].

## 2019-10-17 ENCOUNTER — Ambulatory Visit (INDEPENDENT_AMBULATORY_CARE_PROVIDER_SITE_OTHER): Payer: Medicare Other | Admitting: Family Medicine

## 2019-10-17 ENCOUNTER — Encounter: Payer: Self-pay | Admitting: Family Medicine

## 2019-10-17 ENCOUNTER — Ambulatory Visit (INDEPENDENT_AMBULATORY_CARE_PROVIDER_SITE_OTHER): Payer: Medicare Other

## 2019-10-17 VITALS — BP 144/80 | HR 82 | Temp 97.2°F | Wt 145.2 lb

## 2019-10-17 DIAGNOSIS — M25562 Pain in left knee: Secondary | ICD-10-CM | POA: Diagnosis not present

## 2019-10-17 DIAGNOSIS — S8992XA Unspecified injury of left lower leg, initial encounter: Secondary | ICD-10-CM | POA: Diagnosis not present

## 2019-10-17 DIAGNOSIS — R55 Syncope and collapse: Secondary | ICD-10-CM | POA: Diagnosis not present

## 2019-10-17 DIAGNOSIS — S8000XA Contusion of unspecified knee, initial encounter: Secondary | ICD-10-CM

## 2019-10-17 DIAGNOSIS — M25561 Pain in right knee: Secondary | ICD-10-CM

## 2019-10-17 DIAGNOSIS — I10 Essential (primary) hypertension: Secondary | ICD-10-CM | POA: Diagnosis not present

## 2019-10-17 DIAGNOSIS — L578 Other skin changes due to chronic exposure to nonionizing radiation: Secondary | ICD-10-CM | POA: Diagnosis not present

## 2019-10-17 DIAGNOSIS — I951 Orthostatic hypotension: Secondary | ICD-10-CM

## 2019-10-17 DIAGNOSIS — S8991XA Unspecified injury of right lower leg, initial encounter: Secondary | ICD-10-CM | POA: Diagnosis not present

## 2019-10-17 DIAGNOSIS — M25461 Effusion, right knee: Secondary | ICD-10-CM | POA: Diagnosis not present

## 2019-10-17 MED ORDER — METOPROLOL SUCCINATE ER 50 MG PO TB24: 50.0000 mg | ORAL_TABLET | Freq: Every day | ORAL | 3 refills | Status: DC

## 2019-10-17 MED ORDER — LOSARTAN POTASSIUM-HCTZ 100-25 MG PO TABS: 1.0000 | ORAL_TABLET | Freq: Every day | ORAL | 3 refills | Status: DC

## 2019-10-17 NOTE — Progress Notes (Signed)
   Subjective:    Patient ID: Cynthia Mathis, female    DOB: Mar 05, 1933, 86 y.o.   MRN: [id number]  HPI Here with her daughter who is visiting for a week from [place]. She has several issues to discuss. First she had some spells of lightheadedness for a year or more, and she described this to Dr. Meda Coffee, her cardiologist, when she met with her on 10-01-19. They decided to keep her medications the same. Then about 10 days ago while at home she got up out of bed in the middle of the night to go to the bathroom. She immediately felt dizzy, and the next thing she remembers was waking up on the floor. She apparently passed out and fell landing on her knees first. She was able to get up and use the bathroom, and she went back to bed. She has had no LOC since then. No chest pain or palpitations or SOB.  She says her knees have been stiff and painful since then, especially around both knee caps. She has a hx of osteoarthritis in the knees. She has taken nothing for pain since then. Lastly she has long had dry crusty skin on the hands and forearms, and several places have hard knots on the skin.    Review of Systems  Constitutional: Negative.   Respiratory: Negative.   Cardiovascular: Negative.   Neurological: Positive for dizziness, weakness and light-headedness. Negative for tremors, seizures, syncope, facial asymmetry, speech difficulty, numbness and headaches.       Objective:   Physical Exam Constitutional:      Appearance: Normal appearance.  Cardiovascular:     Rate and Rhythm: Normal rate and regular rhythm.     Pulses: Normal pulses.     Heart sounds: Normal heart sounds.  Pulmonary:     Effort: Pulmonary effort is normal.     Breath sounds: Normal breath sounds.  Musculoskeletal:     Comments: Both knees have crepitus and reduced ROM. Both are tender in the medial and lateral joint spaces, and both patellae are tender  Skin:    Comments: Widespread scaly dry skin on both dorsal hands  and both forearms   Neurological:     Mental Status: She is alert.           Assessment & Plan:  She had an episode of LOC and a fall, likely related to orthostatic hypotension. We will stop Cardizem and instead give her Metoprolol succinate 50 mg daily. She will follow up with Tommi Rumps, her PCP, in 3 weeks. She seems to have contused both knees and we will get Xrays of both knees today. She has a lot of actinic damage to the arms and hands, and we will refer her to Dermatology to evaluate this. She can use Cerave lotion in the meantime.  [name], MD

## 2019-10-27 ENCOUNTER — Encounter (INDEPENDENT_AMBULATORY_CARE_PROVIDER_SITE_OTHER): Payer: Medicare Other | Admitting: Ophthalmology

## 2019-10-28 ENCOUNTER — Other Ambulatory Visit: Payer: Self-pay

## 2019-10-28 ENCOUNTER — Encounter (INDEPENDENT_AMBULATORY_CARE_PROVIDER_SITE_OTHER): Payer: Self-pay | Admitting: Ophthalmology

## 2019-10-28 ENCOUNTER — Ambulatory Visit (INDEPENDENT_AMBULATORY_CARE_PROVIDER_SITE_OTHER): Payer: Medicare Other | Admitting: Ophthalmology

## 2019-10-28 DIAGNOSIS — H353211 Exudative age-related macular degeneration, right eye, with active choroidal neovascularization: Secondary | ICD-10-CM | POA: Diagnosis not present

## 2019-10-28 DIAGNOSIS — H3562 Retinal hemorrhage, left eye: Secondary | ICD-10-CM | POA: Diagnosis not present

## 2019-10-28 DIAGNOSIS — H353221 Exudative age-related macular degeneration, left eye, with active choroidal neovascularization: Secondary | ICD-10-CM | POA: Insufficient documentation

## 2019-10-28 MED ORDER — BEVACIZUMAB CHEMO INJECTION 1.25MG/0.05ML SYRINGE FOR KALEIDOSCOPE
1.2500 mg | INTRAVITREAL | Status: AC | PRN
  Administered 2019-10-28: 1.25 mg via INTRAVITREAL

## 2019-10-28 NOTE — Assessment & Plan Note (Addendum)
The nature of wet macular degeneration was discussed with the patient.  Forms of therapy reviewed include the use of Anti-VEGF medications injected painlessly into the eye, as well as other possible treatment modalities, including thermal laser therapy. Fellow eye involvement and risks were discussed with the patient. 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 The treatment regimen is on a treat as needed basis with the intent to treat if necessary and extend interval of exams when possible. On average 1 out of 6 patients do not need lifetime therapy. However, the risk of recurrent disease is high for a lifetime.  Initially monthly, then periodic, examinations and evaluations will determine whether the next treatment is required on the day of the examination.   OS, less active and stable at 9-week interval exam.  Repeat Avastin and exam in 9 weeks

## 2019-10-28 NOTE — Progress Notes (Signed)
10/28/2019     CHIEF COMPLAINT Patient presents for Retina Follow Up   HISTORY OF PRESENT ILLNESS: Cynthia Mathis is a 86 y.o. female who presents to the clinic today for:   HPI    Retina Follow Up    Patient presents with  Wet AMD.  In left eye.  Severity is moderate.  Since onset it is stable.  I, the attending physician,  performed the HPI with the patient and updated documentation appropriately.          Comments    9 Week AMD f\u OS. Possible Avastin OS. OCT  Pt has noticed a decrease in vision. Pt states OS is better than OD.         Last edited by Tilda Franco on 10/28/2019  9:05 AM. (History)      Referring physician: [name], MD [place],  [place] [id number]  HISTORICAL INFORMATION:   Selected notes from the MEDICAL RECORD NUMBER    Lab Results  Component Value Date   HGBA1C 6.6 (H) 02/12/2019     CURRENT MEDICATIONS: No current outpatient medications on file. (Ophthalmic Drugs)   No current facility-administered medications for this visit. (Ophthalmic Drugs)   Current Outpatient Medications (Other)  Medication Sig  . anastrozole (ARIMIDEX) 1 MG tablet Take 1 tablet (1 mg total) by mouth daily.  [name] atorvastatin (LIPITOR) 20 MG tablet Take 20 mg by mouth daily.  . Calcium Carbonate-Vitamin D (CALCIUM 600+D) 600-400 MG-UNIT per tablet Take 1 tablet by mouth daily.  . Cyanocobalamin (VITAMIN B 12 PO) Take 1,000 mcg by mouth daily.   [name] donepezil (ARICEPT) 10 MG tablet Take 1 tablet (10 mg total) by mouth at bedtime.  . fish oil-omega-3 fatty acids 1000 MG capsule Take 1 g by mouth daily.  [name] GLUCOSAMINE-CHONDROITIN PO Take 1 tablet by mouth 3 (three) times daily. 1500-1200  . losartan-hydrochlorothiazide (HYZAAR) 100-25 MG tablet Take 1 tablet by mouth daily.  . memantine (NAMENDA) 10 MG tablet Take 1 tablet (10 mg total) by mouth 2 (two) times daily.  . metoprolol succinate (TOPROL-XL) 50 MG 24 hr tablet Take 1 tablet (50 mg  total) by mouth daily. Take with or immediately following a meal.  . Multiple Vitamins-Minerals (MULTIVITAMIN & MINERAL PO) Take 1 tablet by mouth daily.  [name] warfarin (COUMADIN) 5 MG tablet TAKE BY MOUTH AS DIRECTED BY COUMADIN CLINIC   No current facility-administered medications for this visit. (Other)      REVIEW OF SYSTEMS:    ALLERGIES Allergies  Allergen Reactions  . Epinephrine Palpitations    heart racing    PAST MEDICAL HISTORY Past Medical History:  Diagnosis Date  . Anxiety   . Borderline diabetes mellitus   . Cancer ([place]) 2020   breast  . Diverticulosis of colon   . DJD (degenerative joint disease)   . Dyslipidemia   . History of hematuria   . History of osteoporosis   . Hx of colonic polyps   . Hypertension   . Hypothyroid   . Macular degeneration    sees [name] for general eye care, sees [name] for retinal injections   . Memory loss   . Osteoporosis    took bisphosphonates, now off. Last DEXA in 2015 showed osteopenia   . Paroxysmal atrial fibrillation (HCC)    sees [name]    Past Surgical History:  Procedure Laterality Date  . APPENDECTOMY  1947  . BREAST LUMPECTOMY WITH RADIOACTIVE  SEED LOCALIZATION Right 05/14/2019   Procedure: RIGHT BREAST LUMPECTOMY WITH RADIOACTIVE SEED LOCALIZATION;  Surgeon: [name], MD;  Location: [facility];  Service: General;  Laterality: Right;  . CATARACT EXTRACTION W/PHACO Bilateral   . COLONOSCOPY  12/02/2014   per [name], severe diverticulosis but no polyps   . MASS EXCISION  04/17/2012   Procedure: EXCISION MASS;  Surgeon: [name], MD;  Location: [facility];  Service: General;  Laterality: N/A;  . THYROIDECTOMY  1990   [name]    FAMILY HISTORY Family History  Problem Relation Age of Onset  . Stroke Mother   . Colon cancer Neg Hx   . Stomach cancer Neg Hx     SOCIAL HISTORY Social History   Tobacco Use  . Smoking status: Former Smoker    Quit  date: 07/10/1965    Years since quitting: 54.3  . Smokeless tobacco: Never Used  . Tobacco comment: 3 cigs per week x 3 years (started in 1964), 12/27/15 does not smoke  Substance Use Topics  . Alcohol use: Yes    Alcohol/week: 3.0 standard drinks    Types: 3 Glasses of wine per week    Comment: occasionally  . Drug use: No         OPHTHALMIC EXAM:  Base Eye Exam    Visual Acuity (Snellen - Linear)      Right Left   Dist [place] 20/40 20/25   Dist ph [place] NI        Tonometry (Tonopen, 9:10 AM)      Right Left   Pressure 13 12       Pupils      Pupils Dark Light Shape React APD   Right PERRL 2 2 Round Minimal None   Left PERRL 2 2 Round Minimal None       Visual Fields (Counting fingers)      Left Right    Full Full       Neuro/Psych    Oriented x3: Yes   Mood/Affect: Normal       Dilation    Left eye: 1.0% Mydriacyl, 2.5% Phenylephrine @ 9:10 AM        Slit Lamp and Fundus Exam    External Exam      Right Left   External  Normal       Slit Lamp Exam      Right Left   Lids/Lashes Normal Normal   Conjunctiva/Sclera White and quiet White and quiet   Cornea Clear Clear   Anterior Chamber Deep and quiet Deep and quiet   Iris Round and reactive Round and reactive   Lens Clear Posterior chamber intraocular lens   Anterior Vitreous Normal Normal       Fundus Exam      Right Left   Posterior Vitreous  Posterior vitreous detachment   Disc  Normal   C/D Ratio  0.25   Macula  Drusen, Soft drusen, Atrophy, Geographic atrophy, no macular thickening, no hemorrhage, Mottling, Retinal pigment epithelial mottling   Vessels  Normal   Periphery  Normal          IMAGING AND PROCEDURES  Imaging and Procedures for 10/28/19  OCT, Retina - OU - Both Eyes       Right Eye Quality was good. Scan locations included subfoveal. Central Foveal Thickness: 198. Progression has been stable. Findings include abnormal foveal contour, central retinal atrophy, outer retinal  atrophy.   Left Eye Central Foveal  Thickness: 240. Progression has improved. Findings include abnormal foveal contour, outer retinal atrophy, central retinal atrophy, no IRF, no SRF.   Notes OD, much less CN VM activity.  Exam as scheduled.  OS, at 9-week exam, much less CN VM activity.  Extensive outer retinal atrophy.  Repeat Avastin today.       Intravitreal Injection, Pharmacologic Agent - OS - Left Eye       Time Out 10/28/2019. 9:59 AM. Confirmed correct patient, procedure, site, and patient consented.   Anesthesia Topical anesthesia was used. Anesthetic medications included Akten 3.5%.   Procedure Preparation included Ofloxacin , 10% betadine to eyelids. A 30 gauge needle was used.   Injection:  1.25 mg Bevacizumab (AVASTIN) SOLN   NDC: [id number], Lot: [id number]   Route: Intravitreal, Site: Left Eye, Waste: 0 mg  Post-op Post injection exam found visual acuity of at least counting fingers. The patient tolerated the procedure well. There were no complications. The patient received written and verbal post procedure care education. Post injection medications were not given.                 ASSESSMENT/PLAN:  Exudative age-related macular degeneration of left eye with active choroidal neovascularization (HCC) The nature of wet macular degeneration was discussed with the patient.  Forms of therapy reviewed include the use of Anti-VEGF medications injected painlessly into the eye, as well as other possible treatment modalities, including thermal laser therapy. Fellow eye involvement and risks were discussed with the patient. 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 The treatment regimen is on a treat as needed basis with the intent to treat if necessary and extend interval of exams when possible. On average 1 out of 6 patients do not need lifetime therapy. However, the risk of recurrent disease is high for a lifetime.  Initially  monthly, then periodic, examinations and evaluations will determine whether the next treatment is required on the day of the examination.   OS, less active and stable at 9-week interval exam.  Repeat Avastin and exam in 9 weeks      ICD-10-CM   1. Exudative age-related macular degeneration of left eye with active choroidal neovascularization (HCC)  H35.3221 OCT, Retina - OU - Both Eyes    Intravitreal Injection, Pharmacologic Agent - OS - Left Eye    Bevacizumab (AVASTIN) SOLN 1.25 mg  2. Exudative age-related macular degeneration of right eye with active choroidal neovascularization (HCC)  H35.3211 OCT, Retina - OU - Both Eyes  3. Retinal hemorrhage of left eye  H35.62     1.  2.  3.  Ophthalmic Meds Ordered this visit:  Meds ordered this encounter  Medications  . Bevacizumab (AVASTIN) SOLN 1.25 mg       No follow-ups on file.  There are no Patient Instructions on file for this visit.   Explained the diagnoses, plan, and follow up with the patient and they expressed understanding.  Patient expressed understanding of the importance of proper follow up care.   Clent Demark Aahna Rossa M.D. Diseases & Surgery of the Retina and Vitreous [facility]ina [facility]abetic [place] 10/28/19     Abbreviations: M myopia (nearsighted); A astigmatism; H hyperopia (farsighted); P presbyopia; Mrx spectacle prescription;  CTL contact lenses; OD right eye; OS left eye; OU both eyes  XT exotropia; ET esotropia; PEK punctate epithelial keratitis; PEE punctate epithelial erosions; DES dry eye syndrome; MGD meibomian gland dysfunction; ATs artificial tears; PFAT's preservative free artificial tears; [place]  nuclear sclerotic cataract; PSC posterior subcapsular cataract; ERM epi-retinal membrane; PVD posterior vitreous detachment; RD retinal detachment; DM diabetes mellitus; DR diabetic retinopathy; NPDR non-proliferative diabetic retinopathy; PDR proliferative diabetic retinopathy; CSME clinically significant macular  edema; DME diabetic macular edema; dbh dot blot hemorrhages; CWS cotton wool spot; POAG primary open angle glaucoma; C/D cup-to-disc ratio; HVF humphrey visual field; GVF goldmann visual field; OCT optical coherence tomography; IOP intraocular pressure; BRVO Branch retinal vein occlusion; CRVO central retinal vein occlusion; CRAO central retinal artery occlusion; BRAO branch retinal artery occlusion; RT retinal tear; SB scleral buckle; PPV pars plana vitrectomy; VH Vitreous hemorrhage; PRP panretinal laser photocoagulation; IVK intravitreal kenalog; VMT vitreomacular traction; MH Macular hole;  NVD neovascularization of the disc; NVE neovascularization elsewhere; AREDS age related eye disease study; ARMD age related macular degeneration; POAG primary open angle glaucoma; EBMD epithelial/anterior basement membrane dystrophy; ACIOL anterior chamber intraocular lens; IOL intraocular lens; PCIOL posterior chamber intraocular lens; Phaco/IOL phacoemulsification with intraocular lens placement; [place] photorefractive keratectomy; LASIK laser assisted in situ keratomileusis; HTN hypertension; DM diabetes mellitus; COPD chronic obstructive pulmonary disease

## 2019-10-29 ENCOUNTER — Telehealth: Payer: Self-pay | Admitting: Family Medicine

## 2019-10-29 NOTE — Telephone Encounter (Signed)
Please advise. Dr. Sarajane Jews is out of the office

## 2019-10-29 NOTE — Telephone Encounter (Signed)
Spoke with [place], she is aware of Cory's message and a follow up has been scheduled.

## 2019-10-29 NOTE — Telephone Encounter (Signed)
She is actually my patient. She can increase Metoprolol to 100 mg daily and follow up with me in the office next week

## 2019-10-29 NOTE — Telephone Encounter (Signed)
Pt daughter [name] would like a call about pt blood pressure. Dr. Sarajane Jews has pt trying a new blood pressure medication and advised to keep a eye on blood pressure for a few weeks.  Pt daughter feels as if her blood pressure has been on the high side 161/90 162/81. She would like a call back in regards to this.

## 2019-11-04 ENCOUNTER — Other Ambulatory Visit: Payer: Self-pay

## 2019-11-05 ENCOUNTER — Encounter: Payer: Self-pay | Admitting: Adult Health

## 2019-11-05 ENCOUNTER — Ambulatory Visit (INDEPENDENT_AMBULATORY_CARE_PROVIDER_SITE_OTHER): Payer: Medicare Other | Admitting: Adult Health

## 2019-11-05 VITALS — BP 154/78 | HR 56 | Temp 97.4°F | Wt 144.0 lb

## 2019-11-05 DIAGNOSIS — I10 Essential (primary) hypertension: Secondary | ICD-10-CM

## 2019-11-05 DIAGNOSIS — R001 Bradycardia, unspecified: Secondary | ICD-10-CM

## 2019-11-05 NOTE — Progress Notes (Signed)
Subjective:    Patient ID: Cynthia Mathis, female    DOB: 1933/05/07, 86 y.o.   MRN: [id number]  HPI 86 year old female who is being evaluated today for follow up regarding hypertension. On 10/17/2019 her PCP stopped Cardizem and switched her to Toprol 50 mg daily due to an episode of LOC and fall that was likely related to orthostatic hypotension. Her daughter called in to the office ( 7 days ago) while her PCP was out of the office and reported that since the medication change that the patients BP was elevated in the 160's/90's. She was given instructions to increase Toprol from 50 mg to 100 mg and follow up in the office.   She reports today that she is unsure if she actually increased the Toprol dose. She has been monitoring her BP at home and reports readings in the 150's/70-80's. She denies fatigue, SOB, or chest pain since starting Toprol   Review of Systems See HPI   Past Medical History:  Diagnosis Date  . Anxiety   . Borderline diabetes mellitus   . Cancer ([facility]) 2020   breast  . Diverticulosis of colon   . DJD (degenerative joint disease)   . Dyslipidemia   . History of hematuria   . History of osteoporosis   . Hx of colonic polyps   . Hypertension   . Hypothyroid   . Macular degeneration    sees [name] for general eye care, sees [name] for retinal injections   . Memory loss   . Osteoporosis    took bisphosphonates, now off. Last DEXA in 2015 showed osteopenia   . Paroxysmal atrial fibrillation (HCC)    sees [name]     Social History   Socioeconomic History  . Marital status: Widowed    Spouse name: Mariane Masters Broccoli  . Number of children: 3  . Years of education: [employer]  . Highest education level: Not on file  Occupational History  . Occupation: Education officer, environmental: RETIRED  Tobacco Use  . Smoking status: Former Smoker    Quit date: 07/10/1965    Years since quitting: 54.3  . Smokeless tobacco: Never Used  . Tobacco comment: 3  cigs per week x 3 years (started in 1964), 12/27/15 does not smoke  Substance and Sexual Activity  . Alcohol use: Yes    Alcohol/week: 3.0 standard drinks    Types: 3 Glasses of wine per week    Comment: occasionally  . Drug use: No  . Sexual activity: Not on file  Other Topics Concern  . Not on file  Social History Narrative   03/26/19 Pt lives at home alone now, widow.   her spouse passed Dec 2019 Mariane Masters Patella, who has been dx'd with mild cognitive impairment vs. mild dementia, former pt of [name]). She does not use caffeine, if so, rarely.   Social Determinants of Health   Financial Resource Strain:   . Difficulty of Paying Living Expenses:   Food Insecurity:   . Worried About Charity fundraiser in the Last Year:   . Arboriculturist in the Last Year:   Transportation Needs: No Transportation Needs  . Lack of Transportation (Medical): No  . Lack of Transportation (Non-Medical): No  Physical Activity:   . Days of Exercise per Week:   . Minutes of Exercise per Session:   Stress:   . Feeling of Stress :   Social Connections:   . Frequency  of Communication with Friends and Family:   . Frequency of Social Gatherings with Friends and Family:   . Attends Religious Services:   . Active Member of Clubs or Organizations:   . Attends Archivist Meetings:   [name] Marital Status:   Intimate Partner Violence:   . Fear of Current or Ex-Partner:   . Emotionally Abused:   [name] Physically Abused:   . Sexually Abused:     Past Surgical History:  Procedure Laterality Date  . APPENDECTOMY  1947  . BREAST LUMPECTOMY WITH RADIOACTIVE SEED LOCALIZATION Right 05/14/2019   Procedure: RIGHT BREAST LUMPECTOMY WITH RADIOACTIVE SEED LOCALIZATION;  Surgeon: [name], MD;  Location: [facility];  Service: General;  Laterality: Right;  . CATARACT EXTRACTION W/PHACO Bilateral   . COLONOSCOPY  12/02/2014   per [name], severe diverticulosis but no polyps   . MASS EXCISION  04/17/2012    Procedure: EXCISION MASS;  Surgeon: [name], MD;  Location: [facility];  Service: General;  Laterality: N/A;  . THYROIDECTOMY  1990   [name]    Family History  Problem Relation Age of Onset  . Stroke Mother   . Colon cancer Neg Hx   . Stomach cancer Neg Hx     Allergies  Allergen Reactions  . Epinephrine Palpitations    heart racing    Current Outpatient Medications on File Prior to Visit  Medication Sig Dispense Refill  . anastrozole (ARIMIDEX) 1 MG tablet Take 1 tablet (1 mg total) by mouth daily. 30 tablet 2  . atorvastatin (LIPITOR) 20 MG tablet Take 20 mg by mouth daily.    . Calcium Carbonate-Vitamin D (CALCIUM 600+D) 600-400 MG-UNIT per tablet Take 1 tablet by mouth daily.    . Cyanocobalamin (VITAMIN B 12 PO) Take 1,000 mcg by mouth daily.     [name] donepezil (ARICEPT) 10 MG tablet Take 1 tablet (10 mg total) by mouth at bedtime. 90 tablet 3  . fish oil-omega-3 fatty acids 1000 MG capsule Take 1 g by mouth daily.    [name] GLUCOSAMINE-CHONDROITIN PO Take 1 tablet by mouth 3 (three) times daily. 1500-1200    . losartan-hydrochlorothiazide (HYZAAR) 100-25 MG tablet Take 1 tablet by mouth daily. 90 tablet 3  . memantine (NAMENDA) 10 MG tablet Take 1 tablet (10 mg total) by mouth 2 (two) times daily. 180 tablet 3  . metoprolol succinate (TOPROL-XL) 50 MG 24 hr tablet Take 1 tablet (50 mg total) by mouth daily. Take with or immediately following a meal. (Patient taking differently: Take 100 mg by mouth daily. Take with or immediately following a meal.) 90 tablet 3  . Multiple Vitamins-Minerals (MULTIVITAMIN & MINERAL PO) Take 1 tablet by mouth daily.    [name] warfarin (COUMADIN) 5 MG tablet TAKE BY MOUTH AS DIRECTED BY COUMADIN CLINIC 90 tablet 2   No current facility-administered medications on file prior to visit.    BP (!) 154/78   Pulse (!) 56   Temp (!) 97.4 F (36.3 C)   Wt 144 lb (65.3 kg)   SpO2 95%   BMI 26.34 kg/m       Objective:    Physical Exam Vitals and nursing note reviewed.  Constitutional:      Appearance: Normal appearance.  Cardiovascular:     Rate and Rhythm: Regular rhythm. Bradycardia present.     Pulses: Normal pulses.     Heart sounds: Normal heart sounds.  Pulmonary:     Effort: Pulmonary effort is  normal.     Breath sounds: Normal breath sounds.  Musculoskeletal:        General: Normal range of motion.  Skin:    General: Skin is warm and dry.  Neurological:     General: No focal deficit present.     Mental Status: She is alert and oriented to person, place, and time.  Psychiatric:        Mood and Affect: Mood normal.        Behavior: Behavior normal.        Thought Content: Thought content normal.        Judgment: Judgment normal.        Assessment & Plan:  BP continues to be elevated and she is bradycardic ( asymptomatic), I will have her check and see how much Toprol she is taking and follow up with her PCP  Dorothyann Peng, NP

## 2019-11-05 NOTE — Patient Instructions (Signed)
See how many tabs of Metoprolol ( Toprol XR)  you are taking and follow up with Dr. Sarajane Jews

## 2019-11-06 ENCOUNTER — Ambulatory Visit: Payer: Medicare Other | Admitting: *Deleted

## 2019-11-06 ENCOUNTER — Other Ambulatory Visit: Payer: Self-pay

## 2019-11-06 DIAGNOSIS — Z5181 Encounter for therapeutic drug level monitoring: Secondary | ICD-10-CM

## 2019-11-06 DIAGNOSIS — I480 Paroxysmal atrial fibrillation: Secondary | ICD-10-CM

## 2019-11-06 LAB — POCT INR: INR: 2.2 (ref 2.0–3.0)

## 2019-11-06 NOTE — Patient Instructions (Signed)
Description   Continue taking 1/2 tablet daily except 1 tablet on Wednesdays.  Recheck INR in 4 weeks. Call with any medication changes or if scheduled for any procedures [phone].

## 2019-11-18 ENCOUNTER — Ambulatory Visit: Payer: Medicare Other | Admitting: Cardiology

## 2019-11-20 ENCOUNTER — Telehealth: Payer: Self-pay | Admitting: Family Medicine

## 2019-11-20 NOTE — Chronic Care Management (AMB) (Signed)
  Chronic Care Management   Note  11/20/2019 Name: Cynthia Mathis MRN: [id number] DOB: 1933/02/06  Cynthia Mathis is a 87 y.o. year old female who is a primary care patient of [name], MD. I reached out to [place] by phone today in response to a referral sent by [name]'s PCP, [name], MD.   [name] was given information about Chronic Care Management services today including:  1. CCM service includes personalized support from designated clinical staff supervised by her physician, including individualized plan of care and coordination with other care providers 2. 24/7 contact phone numbers for assistance for urgent and routine care needs. 3. Service will only be billed when office clinical staff spend 20 minutes or more in a month to coordinate care. 4. Only one practitioner may furnish and bill the service in a calendar month. 5. The patient may stop CCM services at any time (effective at the end of the month) by phone call to the office staff.   Patient agreed to services and verbal consent obtained.   Follow up plan:   [place]

## 2019-11-27 ENCOUNTER — Encounter (INDEPENDENT_AMBULATORY_CARE_PROVIDER_SITE_OTHER): Payer: Medicare Other | Admitting: Ophthalmology

## 2019-11-27 DIAGNOSIS — M170 Bilateral primary osteoarthritis of knee: Secondary | ICD-10-CM | POA: Diagnosis not present

## 2019-11-27 DIAGNOSIS — M1711 Unilateral primary osteoarthritis, right knee: Secondary | ICD-10-CM | POA: Diagnosis not present

## 2019-12-02 ENCOUNTER — Ambulatory Visit (INDEPENDENT_AMBULATORY_CARE_PROVIDER_SITE_OTHER): Payer: Medicare Other | Admitting: Ophthalmology

## 2019-12-02 ENCOUNTER — Encounter (INDEPENDENT_AMBULATORY_CARE_PROVIDER_SITE_OTHER): Payer: Self-pay | Admitting: Ophthalmology

## 2019-12-02 ENCOUNTER — Other Ambulatory Visit: Payer: Self-pay

## 2019-12-02 DIAGNOSIS — H43811 Vitreous degeneration, right eye: Secondary | ICD-10-CM

## 2019-12-02 DIAGNOSIS — H353211 Exudative age-related macular degeneration, right eye, with active choroidal neovascularization: Secondary | ICD-10-CM

## 2019-12-02 MED ORDER — BEVACIZUMAB CHEMO INJECTION 1.25MG/0.05ML SYRINGE FOR KALEIDOSCOPE
1.2500 mg | INTRAVITREAL | Status: AC | PRN
  Administered 2019-12-02: 1.25 mg via INTRAVITREAL

## 2019-12-02 NOTE — Progress Notes (Signed)
12/02/2019     CHIEF COMPLAINT Patient presents for Retina Follow Up   HISTORY OF PRESENT ILLNESS: Cynthia Mathis is a 86 y.o. female who presents to the clinic today for:   HPI    Retina Follow Up    Patient presents with  Wet AMD.  In right eye.  Duration of 8 weeks.  Since onset it is gradually worsening.          Comments    8 week follow up- OCT OU, Possible Avastin OD Patient states she thinks her vision is worsening. No other complaints.       Last edited by Gerda Diss on 12/02/2019  8:47 AM. (History)      Referring physician: [name], MD [place],  [place] [id number]  HISTORICAL INFORMATION:   Selected notes from the MEDICAL RECORD NUMBER    Lab Results  Component Value Date   HGBA1C 6.6 (H) 02/12/2019     CURRENT MEDICATIONS: No current outpatient medications on file. (Ophthalmic Drugs)   No current facility-administered medications for this visit. (Ophthalmic Drugs)   Current Outpatient Medications (Other)  Medication Sig  . anastrozole (ARIMIDEX) 1 MG tablet Take 1 tablet (1 mg total) by mouth daily.  [name] atorvastatin (LIPITOR) 20 MG tablet Take 20 mg by mouth daily.  . Calcium Carbonate-Vitamin D (CALCIUM 600+D) 600-400 MG-UNIT per tablet Take 1 tablet by mouth daily.  . Cyanocobalamin (VITAMIN B 12 PO) Take 1,000 mcg by mouth daily.   [name] donepezil (ARICEPT) 10 MG tablet Take 1 tablet (10 mg total) by mouth at bedtime.  . fish oil-omega-3 fatty acids 1000 MG capsule Take 1 g by mouth daily.  [name] GLUCOSAMINE-CHONDROITIN PO Take 1 tablet by mouth 3 (three) times daily. 1500-1200  . losartan-hydrochlorothiazide (HYZAAR) 100-25 MG tablet Take 1 tablet by mouth daily.  . memantine (NAMENDA) 10 MG tablet Take 1 tablet (10 mg total) by mouth 2 (two) times daily.  . metoprolol succinate (TOPROL-XL) 50 MG 24 hr tablet Take 1 tablet (50 mg total) by mouth daily. Take with or immediately following a meal. (Patient taking differently:  Take 100 mg by mouth daily. Take with or immediately following a meal.)  . Multiple Vitamins-Minerals (MULTIVITAMIN & MINERAL PO) Take 1 tablet by mouth daily.  [name] warfarin (COUMADIN) 5 MG tablet TAKE BY MOUTH AS DIRECTED BY COUMADIN CLINIC   No current facility-administered medications for this visit. (Other)      REVIEW OF SYSTEMS:    ALLERGIES Allergies  Allergen Reactions  . Epinephrine Palpitations    heart racing    PAST MEDICAL HISTORY Past Medical History:  Diagnosis Date  . Anxiety   . Borderline diabetes mellitus   . Cancer ([place]) 2020   breast  . Diverticulosis of colon   . DJD (degenerative joint disease)   . Dyslipidemia   . History of hematuria   . History of osteoporosis   . Hx of colonic polyps   . Hypertension   . Hypothyroid   . Macular degeneration    sees [name] for general eye care, sees [name] for retinal injections   . Memory loss   . Osteoporosis    took bisphosphonates, now off. Last DEXA in 2015 showed osteopenia   . Paroxysmal atrial fibrillation (HCC)    sees [name]    Past Surgical History:  Procedure Laterality Date  . APPENDECTOMY  1947  . BREAST LUMPECTOMY WITH RADIOACTIVE SEED LOCALIZATION Right 05/14/2019  Procedure: RIGHT BREAST LUMPECTOMY WITH RADIOACTIVE SEED LOCALIZATION;  Surgeon: [name], MD;  Location: [facility];  Service: General;  Laterality: Right;  . CATARACT EXTRACTION W/PHACO Bilateral   . COLONOSCOPY  12/02/2014   per [name], severe diverticulosis but no polyps   . MASS EXCISION  04/17/2012   Procedure: EXCISION MASS;  Surgeon: [name], MD;  Location: [facility];  Service: General;  Laterality: N/A;  . THYROIDECTOMY  1990   [name]    FAMILY HISTORY Family History  Problem Relation Age of Onset  . Stroke Mother   . Colon cancer Neg Hx   . Stomach cancer Neg Hx     SOCIAL HISTORY Social History   Tobacco Use  . Smoking status: Former Smoker     Quit date: 07/10/1965    Years since quitting: 54.4  . Smokeless tobacco: Never Used  . Tobacco comment: 3 cigs per week x 3 years (started in 1964), 12/27/15 does not smoke  Substance Use Topics  . Alcohol use: Yes    Alcohol/week: 3.0 standard drinks    Types: 3 Glasses of wine per week    Comment: occasionally  . Drug use: No         OPHTHALMIC EXAM:  Base Eye Exam    Visual Acuity (Snellen - Linear)      Right Left   Dist [place] 20/30-1 20/25-1   Dist ph [place] NI        Tonometry (Tonopen, 8:51 AM)      Right Left   Pressure 9 12       Pupils      Pupils Dark Light Shape React APD   Right PERRL 2 2 Round Minimal None   Left PERRL 2 2 Round Minimal None       Visual Fields (Counting fingers)      Left Right    Full Full       Extraocular Movement      Right Left    Full Full       Neuro/Psych    Oriented x3: Yes   Mood/Affect: Normal       Dilation    Right eye: 1.0% Mydriacyl, 2.5% Phenylephrine @ 8:51 AM        Slit Lamp and Fundus Exam    External Exam      Right Left   External Normal Normal       Slit Lamp Exam      Right Left   Lids/Lashes Normal Normal   Conjunctiva/Sclera White and quiet White and quiet   Cornea Clear Clear   Anterior Chamber Deep and quiet Deep and quiet   Iris Round and reactive Round and reactive   Lens Posterior chamber intraocular lens Posterior chamber intraocular lens   Anterior Vitreous Normal Normal       Fundus Exam      Right Left   Posterior Vitreous Posterior vitreous detachment    Disc Normal    C/D Ratio 0.3    Macula Atrophy, Geographic atrophy, no macular thickening, no hemorrhage, no exudates    Vessels Normal    Periphery Normal           IMAGING AND PROCEDURES  Imaging and Procedures for 12/02/19  OCT, Retina - OU - Both Eyes       Right Eye Quality was good. Scan locations included subfoveal. Central Foveal Thickness: 204. Progression has improved. Findings include abnormal foveal  contour, central retinal  atrophy, outer retinal atrophy, preretinal fibrosis, cystoid macular edema.   Left Eye Quality was good. Scan locations included subfoveal. Central Foveal Thickness: 242. Progression has been stable. Findings include abnormal foveal contour, outer retinal atrophy, inner retinal atrophy.   Notes OD with minor perifoveal CME, overall stable and improved on 8-week interval of examination.  To maintain anatomy, will repeat intravitreal Avastin OD today.       Intravitreal Injection, Pharmacologic Agent - OD - Right Eye       Time Out 12/02/2019. 9:48 AM. Confirmed correct patient, procedure, site, and patient consented.   Anesthesia Topical anesthesia was used. Anesthetic medications included Akten 3.5%.   Procedure Preparation included Tobramycin 0.3%, 10% betadine to eyelids. A 30 gauge needle was used.   Injection:  1.25 mg Bevacizumab (AVASTIN) SOLN   NDC: [id number], [id number]   Route: Intravitreal, Site: Right Eye, Waste: 0 mg  Post-op Post injection exam found visual acuity of at least counting fingers. The patient tolerated the procedure well. There were no complications. The patient received written and verbal post procedure care education.                 ASSESSMENT/PLAN:  Exudative age-related macular degeneration of right eye with active choroidal neovascularization (HCC) The nature of wet macular degeneration was discussed with the patient.  Forms of therapy reviewed include the use of Anti-VEGF medications injected painlessly into the eye, as well as other possible treatment modalities, including thermal laser therapy. Fellow eye involvement and risks were discussed with the patient. 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 The treatment regimen is on a treat as needed basis with the intent to treat if necessary and extend interval of exams when possible. On average 1 out of 6 patients do not  need lifetime therapy. However, the risk of recurrent disease is high for a lifetime.  Initially monthly, then periodic, examinations and evaluations will determine whether the next treatment is required on the day of the examination.  OD, with residual perifoveal CME likely atrophic from active CNVM in the past.  Now controlled.  On 8-week exam interval, repeat intravitreal Avastin OD today      ICD-10-CM   1. Exudative age-related macular degeneration of right eye with active choroidal neovascularization (HCC)  H35.3211 OCT, Retina - OU - Both Eyes    Intravitreal Injection, Pharmacologic Agent - OD - Right Eye    Bevacizumab (AVASTIN) SOLN 1.25 mg  2. Posterior vitreous detachment of right eye  H43.811     1.  OD with minor perifoveal CME, overall stable and improved on 8-week interval of examination.  To maintain anatomy, will repeat intravitreal Avastin OD today. OD, with residual perifoveal CME likely atrophic from active CNVM in the past.  Now controlled.  On 8-week exam interval, repeat intravitreal Avastin OD today 2.  3.  Return visit OS as scheduled  Ophthalmic Meds Ordered this visit:  Meds ordered this encounter  Medications  . Bevacizumab (AVASTIN) SOLN 1.25 mg       Return in about 8 weeks (around 01/27/2020) for dilate, OD, AVASTIN OCT.  There are no Patient Instructions on file for this visit.   Explained the diagnoses, plan, and follow up with the patient and they expressed understanding.  Patient expressed understanding of the importance of proper follow up care.   Clent Demark Laryssa Hassing M.D. Diseases & Surgery of the Retina and Vitreous Retina & Diabetic [place] 12/02/19  Abbreviations: M myopia (nearsighted); A astigmatism; H hyperopia (farsighted); P presbyopia; Mrx spectacle prescription;  CTL contact lenses; OD right eye; OS left eye; OU both eyes  XT exotropia; ET esotropia; PEK punctate epithelial keratitis; PEE punctate epithelial erosions; DES dry  eye syndrome; MGD meibomian gland dysfunction; ATs artificial tears; PFAT's preservative free artificial tears; [place] nuclear sclerotic cataract; PSC posterior subcapsular cataract; ERM epi-retinal membrane; PVD posterior vitreous detachment; RD retinal detachment; DM diabetes mellitus; DR diabetic retinopathy; NPDR non-proliferative diabetic retinopathy; PDR proliferative diabetic retinopathy; CSME clinically significant macular edema; DME diabetic macular edema; dbh dot blot hemorrhages; CWS cotton wool spot; POAG primary open angle glaucoma; C/D cup-to-disc ratio; HVF humphrey visual field; GVF goldmann visual field; OCT optical coherence tomography; IOP intraocular pressure; BRVO Branch retinal vein occlusion; CRVO central retinal vein occlusion; CRAO central retinal artery occlusion; BRAO branch retinal artery occlusion; RT retinal tear; SB scleral buckle; PPV pars plana vitrectomy; VH Vitreous hemorrhage; PRP panretinal laser photocoagulation; IVK intravitreal kenalog; VMT vitreomacular traction; MH Macular hole;  NVD neovascularization of the disc; NVE neovascularization elsewhere; AREDS age related eye disease study; ARMD age related macular degeneration; POAG primary open angle glaucoma; EBMD epithelial/anterior basement membrane dystrophy; ACIOL anterior chamber intraocular lens; IOL intraocular lens; PCIOL posterior chamber intraocular lens; Phaco/IOL phacoemulsification with intraocular lens placement; [place] photorefractive keratectomy; LASIK laser assisted in situ keratomileusis; HTN hypertension; DM diabetes mellitus; COPD chronic obstructive pulmonary disease

## 2019-12-02 NOTE — Assessment & Plan Note (Signed)
The nature of wet macular degeneration was discussed with the patient.  Forms of therapy reviewed include the use of Anti-VEGF medications injected painlessly into the eye, as well as other possible treatment modalities, including thermal laser therapy. Fellow eye involvement and risks were discussed with the patient. 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 The treatment regimen is on a treat as needed basis with the intent to treat if necessary and extend interval of exams when possible. On average 1 out of 6 patients do not need lifetime therapy. However, the risk of recurrent disease is high for a lifetime.  Initially monthly, then periodic, examinations and evaluations will determine whether the next treatment is required on the day of the examination.  OD, with residual perifoveal CME likely atrophic from active CNVM in the past.  Now controlled.  On 8-week exam interval, repeat intravitreal Avastin OD today

## 2019-12-04 ENCOUNTER — Other Ambulatory Visit: Payer: Self-pay

## 2019-12-04 ENCOUNTER — Ambulatory Visit: Payer: Medicare Other | Admitting: *Deleted

## 2019-12-04 DIAGNOSIS — I480 Paroxysmal atrial fibrillation: Secondary | ICD-10-CM

## 2019-12-04 DIAGNOSIS — Z5181 Encounter for therapeutic drug level monitoring: Secondary | ICD-10-CM | POA: Diagnosis not present

## 2019-12-04 LAB — POCT INR: INR: 2 (ref 2.0–3.0)

## 2019-12-04 NOTE — Patient Instructions (Signed)
Description   Continue taking 1/2 tablet daily except 1 tablet on Wednesdays.  Recheck INR in 5 weeks. Call with any medication changes or if scheduled for any procedures [phone].

## 2019-12-11 ENCOUNTER — Encounter: Payer: Self-pay | Admitting: Physician Assistant

## 2019-12-11 ENCOUNTER — Telehealth: Payer: Self-pay | Admitting: Family Medicine

## 2019-12-11 ENCOUNTER — Other Ambulatory Visit: Payer: Self-pay

## 2019-12-11 ENCOUNTER — Ambulatory Visit: Payer: Medicare Other | Admitting: Physician Assistant

## 2019-12-11 DIAGNOSIS — L570 Actinic keratosis: Secondary | ICD-10-CM

## 2019-12-11 DIAGNOSIS — D485 Neoplasm of uncertain behavior of skin: Secondary | ICD-10-CM | POA: Diagnosis not present

## 2019-12-11 DIAGNOSIS — Z1283 Encounter for screening for malignant neoplasm of skin: Secondary | ICD-10-CM

## 2019-12-11 DIAGNOSIS — C4492 Squamous cell carcinoma of skin, unspecified: Secondary | ICD-10-CM

## 2019-12-11 DIAGNOSIS — D0462 Carcinoma in situ of skin of left upper limb, including shoulder: Secondary | ICD-10-CM | POA: Diagnosis not present

## 2019-12-11 HISTORY — DX: Squamous cell carcinoma of skin, unspecified: C44.92

## 2019-12-11 NOTE — Telephone Encounter (Signed)
Pt had to cancel her pharmacist meeting on 6/23 due to being out of town that week. She will be back on 6/28. Sending note back to reach out to the pt to r/s. She would like a telephone call visit if possible when r/s   Thanks!

## 2019-12-11 NOTE — Telephone Encounter (Signed)
Per the patients chart she is to take 1 tablet a day. Is this correct?

## 2019-12-11 NOTE — Patient Instructions (Signed)

## 2019-12-11 NOTE — Progress Notes (Signed)
   New Patient Visit  Subjective  Cynthia Mathis is a 86 y.o. female who presents for the following: Annual Exam (Concerns left top of hand.has grown very quick.). Wants a full skin check. She has a spot on top of the left hand that has been there for 2 years and has grown. It isnt sore. She has never had a skin cancer in the past.   Objective  Well appearing patient in no apparent distress; mood and affect are within normal limits.  A full examination was performed including head, eyes, ears, nose, lips, neck, chest, axillae, abdomen, back, buttocks, bilateral upper extremities, bilateral lower extremities, hands, feet, fingers, toes, fingernails, and toenails. All findings within normal limits unless otherwise noted below. No suspicious moles noted on back.    Objective  Head - Anterior (Face), Left Forearm - Posterior, Left Hand - Posterior, Right Forearm - Posterior, Right Hand - Posterior: Erythematous patches with gritty scale.  Objective  Left Hand - Posterior: 11.5 to middle finger nail bed. Crusted inflamed horn       Assessment & Plan  AK (actinic keratosis) (5) Head - Anterior (Face); Left Hand - Posterior; Right Hand - Posterior; Left Forearm - Posterior; Right Forearm - Posterior  She has multiple Aks on face, hands and forearms. I will plan to treat these with Tolak cream once we determine what the lesion on the left hand is. We will plan to do the hands ASAP with the Tolak and do her face in October.   Neoplasm of uncertain behavior of skin Left Hand - Posterior  Skin / nail biopsy Type of biopsy: tangential   Informed consent: discussed and consent obtained   Timeout: patient name, date of birth, surgical site, and procedure verified   Procedure prep:  Patient was prepped and draped in usual sterile fashion (Non sterile) Prep type:  Chlorhexidine Anesthesia: the lesion was anesthetized in a standard fashion   Anesthetic:  1% lidocaine w/ epinephrine  1-100,000 local infiltration Outcome: patient tolerated procedure well   Post-procedure details: wound care instructions given    Specimen 1 - Surgical pathology Differential Diagnosis: scc vs bcc Check Margins: No  Skin exam for malignant neoplasm Mid Back  Total body skin exam.

## 2019-12-11 NOTE — Telephone Encounter (Signed)
[name] (ok per [facility]) stated the pt has been, by accident, taking 2 pills a day of the metoprolol and he would like to know the correct dosage and she has five left to take so the pharmacy will not refill it due to her supposing having enough if taken the correct dosage.   [name] can be reached at [phone]    Medication Refill:  Metoprolol Pharmacy: [facility] [place]: [phone]

## 2019-12-12 MED ORDER — METOPROLOL SUCCINATE ER 100 MG PO TB24
100.0000 mg | ORAL_TABLET | Freq: Every day | ORAL | 3 refills | Status: DC
Start: 2019-12-12 — End: 2020-06-19

## 2019-12-12 NOTE — Addendum Note (Signed)
Addended by: Rebecca Eaton on: 12/12/2019 01:35 PM   Modules accepted: Orders

## 2019-12-12 NOTE — Telephone Encounter (Signed)
Spoke with the patient. She is aware of the medication changes and that I have sent in a new prescription.

## 2019-12-12 NOTE — Telephone Encounter (Signed)
She saw Tommi Rumps in April and he advised her to increase the Metoprolol to 2 tablets a day (a total of 100 mg). Stop the 50 mg, and call in Metoprolol succinate 100 mg to take once daily, #90 with 3 rf

## 2019-12-15 ENCOUNTER — Telehealth: Payer: Self-pay

## 2019-12-15 NOTE — Telephone Encounter (Signed)
-----   Message from Arlyss Gandy, PA-C sent at 12/15/2019  8:53 AM EDT ----- 30 min with me

## 2019-12-15 NOTE — Telephone Encounter (Signed)
Patient scheduled previously under CCM pharmacist 2. Deferring to [place].

## 2019-12-15 NOTE — Telephone Encounter (Signed)
Phone call to patient with her pathology results. Patient aware of results.  

## 2019-12-16 NOTE — Telephone Encounter (Signed)
Patients son called in with questions about this medication. He stated that the patients BP had been elevated and wanted to make sure she was taking her medication correctly. I informed him that the patient should be taking metoprolol 100mg , 1 tablet a day and if BP continues to be elevated to call back. Nothing further needed.

## 2019-12-23 ENCOUNTER — Telehealth: Payer: Self-pay | Admitting: Family Medicine

## 2019-12-23 NOTE — Telephone Encounter (Signed)
Cynthia Mathis (daughter) called to see if the patient needs to be seen. Dr. Sarajane Jews recently changed the patients BP medication to metoprolol succinate (TOPROL-XL) 100 MG 24 hr tablet and since then her BP has been ranging 141/107 in the PM and 189/76 in the AM. The daughter will be in town this Friday if they need to schedule for the patient to be seen.  Please advise

## 2019-12-24 NOTE — Telephone Encounter (Signed)
Set up an OV to evaluate her

## 2019-12-24 NOTE — Telephone Encounter (Signed)
Left a detailed message on Cynthia Mathis's verified voice mail letting her know that Dr. Sarajane Jews would like to see the patient for a follow up and to please call back and schedule this.

## 2019-12-25 ENCOUNTER — Telehealth: Payer: Self-pay | Admitting: Physician Assistant

## 2019-12-25 ENCOUNTER — Other Ambulatory Visit: Payer: Self-pay

## 2019-12-25 NOTE — Telephone Encounter (Signed)
Son calling for results, and questioning surgery she has scheduled

## 2019-12-25 NOTE — Telephone Encounter (Signed)
Spoke with patient's son and informed him that I spoke with his mom regarding her results.  Per patient's son it's best to call him first before calling his mom because sometimes she forget to give them the information regarding appointments.

## 2019-12-25 NOTE — Telephone Encounter (Signed)
Spoke with the patient's daughter. The patient has been scheduled for Friday. Nothing further needed at this time.

## 2019-12-26 ENCOUNTER — Ambulatory Visit (INDEPENDENT_AMBULATORY_CARE_PROVIDER_SITE_OTHER): Payer: Medicare Other | Admitting: Family Medicine

## 2019-12-26 ENCOUNTER — Encounter: Payer: Self-pay | Admitting: Family Medicine

## 2019-12-26 VITALS — BP 130/70 | HR 54 | Temp 97.4°F | Wt 147.8 lb

## 2019-12-26 DIAGNOSIS — I10 Essential (primary) hypertension: Secondary | ICD-10-CM

## 2019-12-26 NOTE — Progress Notes (Signed)
   Subjective:    Patient ID: Cynthia Mathis, female    DOB: 25-Aug-1932, 86 y.o.   MRN: [id number]  HPI Here with her daughter to follow up on BP. She feels fine and has no complaints. Her heart rates are stable in the range of 50 to 80. Her systolic pressures are often as high as the 180's in the mornings, but then they settle down to the 130's or 140's by the afternoons. She is taking Losartan-HCT 100-25 in the morning and Metoprolol succinate 100 mg in the evenings.    Review of Systems  Constitutional: Negative.   Respiratory: Negative.   Cardiovascular: Negative.   Neurological: Negative.        Objective:   Physical Exam Constitutional:      Appearance: Normal appearance.  Cardiovascular:     Rate and Rhythm: Normal rate and regular rhythm.     Pulses: Normal pulses.     Heart sounds: Normal heart sounds.  Pulmonary:     Effort: Pulmonary effort is normal.     Breath sounds: Normal breath sounds.  Musculoskeletal:     Right lower leg: No edema.     Left lower leg: No edema.  Neurological:     Mental Status: She is alert.           Assessment & Plan:  HTN. We will change the Losartan-HCT to take 1/2 tablet (50-12.5) twice a day to give better morning coverage. They will report back in one week.  [name], MD

## 2019-12-29 NOTE — Telephone Encounter (Signed)
Pt's daughter, Oretha Caprice, stated that the pt is still taking the verapamil and thought this was discontinued? Pt still continues to have high bp. [name] would like to know what she is suppose to be taking for her blood pressure.   [name] can be reached at [phone]

## 2019-12-30 ENCOUNTER — Encounter (INDEPENDENT_AMBULATORY_CARE_PROVIDER_SITE_OTHER): Payer: Medicare Other | Admitting: Ophthalmology

## 2019-12-30 NOTE — Telephone Encounter (Signed)
She should be taking Metoprolol once a day, Verapamil once a day, and Losartan HCT 1/2 tablet twice a day. She will see her cardiologist, Dr. Meda Coffee, on July 2

## 2019-12-30 NOTE — Telephone Encounter (Signed)
I understand. Tell her to stay OFF the Verapamil

## 2019-12-30 NOTE — Telephone Encounter (Signed)
Spoke with [place], she is aware of the medication changes. Nothing further needed.

## 2019-12-30 NOTE — Telephone Encounter (Signed)
Spoke with the patients daughter. She stated she thought we were stopping the verapamil and start metoprolol. BP has been 200/90 on average with all 3 medications. Patient stopped taking the verapamil and BP has started to come down. 151/71 last night.

## 2019-12-31 ENCOUNTER — Ambulatory Visit: Payer: Medicare Other

## 2020-01-06 ENCOUNTER — Other Ambulatory Visit: Payer: Self-pay

## 2020-01-06 ENCOUNTER — Encounter (INDEPENDENT_AMBULATORY_CARE_PROVIDER_SITE_OTHER): Payer: Self-pay | Admitting: Ophthalmology

## 2020-01-06 ENCOUNTER — Ambulatory Visit (INDEPENDENT_AMBULATORY_CARE_PROVIDER_SITE_OTHER): Payer: Medicare Other | Admitting: Ophthalmology

## 2020-01-06 DIAGNOSIS — H353221 Exudative age-related macular degeneration, left eye, with active choroidal neovascularization: Secondary | ICD-10-CM | POA: Diagnosis not present

## 2020-01-06 MED ORDER — BEVACIZUMAB CHEMO INJECTION 1.25MG/0.05ML SYRINGE FOR KALEIDOSCOPE
1.2500 mg | INTRAVITREAL | Status: AC | PRN
  Administered 2020-01-06: 1.25 mg via INTRAVITREAL

## 2020-01-06 NOTE — Assessment & Plan Note (Signed)
OS improved anatomy at 10-week interval post intravitreal Avastin.  We will repeat today and examination in 10 weeks

## 2020-01-06 NOTE — Progress Notes (Signed)
01/06/2020     CHIEF COMPLAINT Patient presents for Retina Follow Up   HISTORY OF PRESENT ILLNESS: Cynthia Mathis is a 86 y.o. female who presents to the clinic today for:   HPI    Retina Follow Up    Patient presents with  Wet AMD.  In left eye.  Severity is moderate.  Duration of 10 weeks.  Since onset it is stable.  I, the attending physician,  performed the HPI with the patient and updated documentation appropriately.          Comments    10 Week AMD f\u OS. Possible Avastin OS. OCT  Pt states no changes in vision. Denies any complaints.       Last edited by Tilda Franco on 01/06/2020  9:39 AM. (History)      Referring physician: [name], MD [place],  [place] [id number]  HISTORICAL INFORMATION:   Selected notes from the MEDICAL RECORD NUMBER    Lab Results  Component Value Date   HGBA1C 6.6 (H) 02/12/2019     CURRENT MEDICATIONS: No current outpatient medications on file. (Ophthalmic Drugs)   No current facility-administered medications for this visit. (Ophthalmic Drugs)   Current Outpatient Medications (Other)  Medication Sig  . anastrozole (ARIMIDEX) 1 MG tablet Take 1 tablet (1 mg total) by mouth daily.  [name] atorvastatin (LIPITOR) 20 MG tablet Take 20 mg by mouth daily.  . Calcium Carbonate-Vitamin D (CALCIUM 600+D) 600-400 MG-UNIT per tablet Take 1 tablet by mouth daily.  . Cyanocobalamin (VITAMIN B 12 PO) Take 1,000 mcg by mouth daily.   [name] donepezil (ARICEPT) 10 MG tablet Take 1 tablet (10 mg total) by mouth at bedtime.  . fish oil-omega-3 fatty acids 1000 MG capsule Take 1 g by mouth daily.  [name] GLUCOSAMINE-CHONDROITIN PO Take 1 tablet by mouth 3 (three) times daily. 1500-1200  . losartan-hydrochlorothiazide (HYZAAR) 100-25 MG tablet Take 1 tablet by mouth daily.  . memantine (NAMENDA) 10 MG tablet Take 1 tablet (10 mg total) by mouth 2 (two) times daily.  . metoprolol succinate (TOPROL-XL) 100 MG 24 hr tablet Take 1  tablet (100 mg total) by mouth daily. Take with or immediately following a meal.  . Multiple Vitamins-Minerals (MULTIVITAMIN & MINERAL PO) Take 1 tablet by mouth daily.  . verapamil (CALAN-SR) 240 MG CR tablet Take 240 mg by mouth daily.  [name] warfarin (COUMADIN) 5 MG tablet TAKE BY MOUTH AS DIRECTED BY COUMADIN CLINIC   No current facility-administered medications for this visit. (Other)      REVIEW OF SYSTEMS:    ALLERGIES Allergies  Allergen Reactions  . Epinephrine Palpitations    heart racing    PAST MEDICAL HISTORY Past Medical History:  Diagnosis Date  . Anxiety   . Borderline diabetes mellitus   . Cancer ([place]) 2020   breast  . Diverticulosis of colon   . DJD (degenerative joint disease)   . Dyslipidemia   . History of hematuria   . History of osteoporosis   . Hx of colonic polyps   . Hypertension   . Hypothyroid   . Macular degeneration    sees [name] for general eye care, sees [name] for retinal injections   . Memory loss   . Osteoporosis    took bisphosphonates, now off. Last DEXA in 2015 showed osteopenia   . Paroxysmal atrial fibrillation (HCC)    sees [name]   . SCCA (squamous cell carcinoma) of skin  12/11/2019   Left Hand Posterior (in situ)   Past Surgical History:  Procedure Laterality Date  . APPENDECTOMY  1947  . BREAST LUMPECTOMY WITH RADIOACTIVE SEED LOCALIZATION Right 05/14/2019   Procedure: RIGHT BREAST LUMPECTOMY WITH RADIOACTIVE SEED LOCALIZATION;  Surgeon: [name], MD;  Location: [place];  Service: General;  Laterality: Right;  . CATARACT EXTRACTION W/PHACO Bilateral   . COLONOSCOPY  12/02/2014   per [name], severe diverticulosis but no polyps   . MASS EXCISION  04/17/2012   Procedure: EXCISION MASS;  Surgeon: [name], MD;  Location: [facility];  Service: General;  Laterality: N/A;  . THYROIDECTOMY  1990   [name]    FAMILY HISTORY Family History  Problem Relation Age of Onset   . Stroke Mother   . Colon cancer Neg Hx   . Stomach cancer Neg Hx     SOCIAL HISTORY Social History   Tobacco Use  . Smoking status: Former Smoker    Quit date: 07/10/1965    Years since quitting: 54.5  . Smokeless tobacco: Never Used  . Tobacco comment: 3 cigs per week x 3 years (started in 1964), 12/27/15 does not smoke  Vaping Use  . Vaping Use: Never used  Substance Use Topics  . Alcohol use: Yes    Alcohol/week: 3.0 standard drinks    Types: 3 Glasses of wine per week    Comment: occasionally  . Drug use: No         OPHTHALMIC EXAM:  Base Eye Exam    Visual Acuity (Snellen - Linear)      Right Left   Dist [place] 20/30 -2 20/40   Dist ph   NI       Tonometry (Tonopen, 9:44 AM)      Right Left   Pressure 11 12       Pupils      Pupils Dark Light Shape React APD   Right PERRL 2 2 Round Minimal None   Left PERRL 2 2 Round Minimal None       Visual Fields (Counting fingers)      Left Right    Full        Neuro/Psych    Oriented x3: Yes   Mood/Affect: Normal       Dilation    Left eye: 1.0% Mydriacyl, 2.5% Phenylephrine @ 9:44 AM        Slit Lamp and Fundus Exam    External Exam      Right Left   External  Normal       Slit Lamp Exam      Right Left   Lids/Lashes Normal Normal   Conjunctiva/Sclera White and quiet White and quiet   Cornea Clear Clear   Anterior Chamber Deep and quiet Deep and quiet   Iris Round and reactive Round and reactive   Lens Clear Posterior chamber intraocular lens   Anterior Vitreous Normal Normal       Fundus Exam      Right Left   Posterior Vitreous  Posterior vitreous detachment   Disc  Normal   C/D Ratio  0.25   Macula  Drusen, Soft drusen, Atrophy, Geographic atrophy, no macular thickening, no hemorrhage, Mottling, Retinal pigment epithelial mottling   Vessels  Normal   Periphery  Normal          IMAGING AND PROCEDURES  Imaging and Procedures for 01/06/20  OCT, Retina - OU - Both Eyes  Right Eye Quality was good. Scan locations included subfoveal. Central Foveal Thickness: 204. Progression has improved. Findings include no IRF, no SRF.   Left Eye Quality was good. Scan locations included subfoveal. Central Foveal Thickness: 236. Progression has improved. Findings include no SRF, no IRF, outer retinal atrophy.   Notes OS  improved and stable macular condition from wet AMD at 10-week interval post Avastin.       Intravitreal Injection, Pharmacologic Agent - OS - Left Eye       Time Out 01/06/2020. 11:23 AM. Confirmed correct patient, procedure, site, and patient consented.   Anesthesia Topical anesthesia was used. Anesthetic medications included Akten 3.5%.   Procedure Preparation included 10% betadine to eyelids, Tobramycin 0.3%. A 30 gauge needle was used.   Injection:  1.25 mg Bevacizumab (AVASTIN) SOLN   NDC: [address]-5, Lot: [id number]   Route: Intravitreal, Site: Left Eye, Waste: 0 mg  Post-op Post injection exam found visual acuity of at least counting fingers. The patient tolerated the procedure well. There were no complications. The patient received written and verbal post procedure care education. Post injection medications were not given.                 ASSESSMENT/PLAN:  Exudative age-related macular degeneration of left eye with active choroidal neovascularization (HCC) OS improved anatomy at 10-week interval post intravitreal Avastin.  We will repeat today and examination in 10 weeks      ICD-10-CM   1. Exudative age-related macular degeneration of left eye with active choroidal neovascularization (HCC)  H35.3221 OCT, Retina - OU - Both Eyes    Intravitreal Injection, Pharmacologic Agent - OS - Left Eye    Bevacizumab (AVASTIN) SOLN 1.25 mg    1.  2.  3.  Ophthalmic Meds Ordered this visit:  Meds ordered this encounter  Medications  . Bevacizumab (AVASTIN) SOLN 1.25 mg       Return in about 10 weeks (around 03/16/2020)  for dilate, OS, AVASTIN OCT.  There are no Patient Instructions on file for this visit.   Explained the diagnoses, plan, and follow up with the patient and they expressed understanding.  Patient expressed understanding of the importance of proper follow up care.   Clent Demark Haila Dena M.D. Diseases & Surgery of the Retina and Vitreous [facility] [place] 01/06/20     Abbreviations: M myopia (nearsighted); A astigmatism; H hyperopia (farsighted); P presbyopia; Mrx spectacle prescription;  CTL contact lenses; OD right eye; OS left eye; OU both eyes  XT exotropia; ET esotropia; PEK punctate epithelial keratitis; PEE punctate epithelial erosions; DES dry eye syndrome; MGD meibomian gland dysfunction; ATs artificial tears; PFAT's preservative free artificial tears; [place] nuclear sclerotic cataract; PSC posterior subcapsular cataract; ERM epi-retinal membrane; PVD posterior vitreous detachment; RD retinal detachment; DM diabetes mellitus; DR diabetic retinopathy; NPDR non-proliferative diabetic retinopathy; PDR proliferative diabetic retinopathy; CSME clinically significant macular edema; DME diabetic macular edema; dbh dot blot hemorrhages; CWS cotton wool spot; POAG primary open angle glaucoma; C/D cup-to-disc ratio; HVF humphrey visual field; GVF goldmann visual field; OCT optical coherence tomography; IOP intraocular pressure; BRVO Branch retinal vein occlusion; CRVO central retinal vein occlusion; CRAO central retinal artery occlusion; BRAO branch retinal artery occlusion; RT retinal tear; SB scleral buckle; PPV pars plana vitrectomy; VH Vitreous hemorrhage; PRP panretinal laser photocoagulation; IVK intravitreal kenalog; VMT vitreomacular traction; MH Macular hole;  NVD neovascularization of the disc; NVE neovascularization elsewhere; AREDS age related eye disease study; ARMD age related macular degeneration; POAG primary open  angle glaucoma; EBMD epithelial/anterior basement membrane dystrophy;  ACIOL anterior chamber intraocular lens; IOL intraocular lens; PCIOL posterior chamber intraocular lens; Phaco/IOL phacoemulsification with intraocular lens placement; [place] photorefractive keratectomy; LASIK laser assisted in situ keratomileusis; HTN hypertension; DM diabetes mellitus; COPD chronic obstructive pulmonary disease

## 2020-01-09 ENCOUNTER — Ambulatory Visit: Payer: Medicare Other | Admitting: Cardiology

## 2020-01-09 ENCOUNTER — Other Ambulatory Visit: Payer: Self-pay

## 2020-01-09 ENCOUNTER — Ambulatory Visit (INDEPENDENT_AMBULATORY_CARE_PROVIDER_SITE_OTHER): Payer: Medicare Other | Admitting: *Deleted

## 2020-01-09 VITALS — BP 140/70 | HR 72 | Ht 62.0 in | Wt 142.0 lb

## 2020-01-09 DIAGNOSIS — I480 Paroxysmal atrial fibrillation: Secondary | ICD-10-CM

## 2020-01-09 DIAGNOSIS — I10 Essential (primary) hypertension: Secondary | ICD-10-CM

## 2020-01-09 DIAGNOSIS — E785 Hyperlipidemia, unspecified: Secondary | ICD-10-CM

## 2020-01-09 DIAGNOSIS — Z7901 Long term (current) use of anticoagulants: Secondary | ICD-10-CM | POA: Diagnosis not present

## 2020-01-09 DIAGNOSIS — Z5181 Encounter for therapeutic drug level monitoring: Secondary | ICD-10-CM | POA: Diagnosis not present

## 2020-01-09 LAB — POCT INR: INR: 2.1 (ref 2.0–3.0)

## 2020-01-09 NOTE — Patient Instructions (Signed)
Medication Instructions:  Your physician recommends that you continue on your current medications as directed. Please refer to the Current Medication list given to you today.  *If you need a refill on your cardiac medications before your next appointment, please call your pharmacy*  Follow-Up: At [facility], you and your health needs are our priority.  As part of our continuing mission to provide you with exceptional heart care, we have created designated Provider Care Teams.  These Care Teams include your primary Cardiologist (physician) and Advanced Practice Providers (APPs -  Physician Assistants and Nurse Practitioners) who all work together to provide you with the care you need, when you need it.  Your next appointment:   6 month(s)  The format for your next appointment:   In Person  Provider:   You may see Ena Dawley, MD or one of the following Advanced Practice Providers on your designated Care Team:    [name], PA-C  Ermalinda Barrios, PA-C

## 2020-01-09 NOTE — Patient Instructions (Signed)
Description   Continue taking 1/2 tablet daily except 1 tablet on Wednesdays.  Recheck INR in 6 weeks. Call with any medication changes or if scheduled for any procedures [phone].

## 2020-01-09 NOTE — Progress Notes (Signed)
Cardiology Office Note    Date:  01/09/2020   ID:  Cynthia Mathis, DOB 11-03-1932, MRN [id number]  PCP:  [name], MD  Cardiologist:  Ena Dawley, MD  Referring physician: Noralee Space, MD Reason for visit: 6 months follow-up  History of Present Illness:  Cynthia Mathis is a 86 y.o. female with h/o DM, hyperlipidemia, hypertension who presented to the ER on 06/29/2016 with palpitations and was diagnosed with new onset atrial fibrillation with RVR - ventricular rate 140 BPM. The patient was symptomatic with dizziness and felt the onset of atrial fibrillation. While in the ER she spontaneously cardioverted into the SR and was discharged home. CHADVASC score: 5. She was started on Eliquis and reports no bleeding. No palpitations since then.   10/01/2019 patient is coming after 6 months, but the patient is going through very difficult times,  She has recently lost her lifelong husband in December 2020, was also my patient.  She lives alone but has support of her family especially her son.  She denies any palpitations dizziness or falls, she has occasional dizziness when she first gets up in the morning.  No syncope.  She complains of progressively worsening memory impairment, she has not been experiencing any chest pain shortness of breath or lower extremity edema.  01/09/2020 -the patient is coming after 4 months, she has been feeling well, she has had problems with labile hypertension lately but now seems to be doing well on losartan in the morning in combination with hydrochlorothiazide and Toprol XL at night.  She denies any chest pain, she developed shortness of breath while she was taking verapamil so she discontinued upon the recommendation of Dr. Sarajane Jews, she denies any palpitation dizziness or syncope, no recent falls no lower extremity edema no claudications.  Past Medical History:  Diagnosis Date  . Anxiety   . Borderline diabetes mellitus   . Cancer ([place]) 2020   breast  .  Diverticulosis of colon   . DJD (degenerative joint disease)   . Dyslipidemia   . History of hematuria   . History of osteoporosis   . Hx of colonic polyps   . Hypertension   . Hypothyroid   . Macular degeneration    sees [name] for general eye care, sees [name] for retinal injections   . Memory loss   . Osteoporosis    took bisphosphonates, now off. Last DEXA in 2015 showed osteopenia   . Paroxysmal atrial fibrillation (HCC)    sees [name]   . SCCA (squamous cell carcinoma) of skin 12/11/2019   Left Hand Posterior (in situ)   Past Surgical History:  Procedure Laterality Date  . APPENDECTOMY  1947  . BREAST LUMPECTOMY WITH RADIOACTIVE SEED LOCALIZATION Right 05/14/2019   Procedure: RIGHT BREAST LUMPECTOMY WITH RADIOACTIVE SEED LOCALIZATION;  Surgeon: [name], MD;  Location: [facility];  Service: General;  Laterality: Right;  . CATARACT EXTRACTION W/PHACO Bilateral   . COLONOSCOPY  12/02/2014   per [name], severe diverticulosis but no polyps   . MASS EXCISION  04/17/2012   Procedure: EXCISION MASS;  Surgeon: [name], MD;  Location: [facility];  Service: General;  Laterality: N/A;  . THYROIDECTOMY  1990   [name]   Current Medications: Outpatient Medications Prior to Visit  Medication Sig Dispense Refill  . anastrozole (ARIMIDEX) 1 MG tablet Take 1 tablet (1 mg total) by mouth daily. 30 tablet 2  . atorvastatin (LIPITOR) 20 MG  tablet Take 20 mg by mouth daily.    . Calcium Carbonate-Vitamin D (CALCIUM 600+D) 600-400 MG-UNIT per tablet Take 1 tablet by mouth daily.    . Cyanocobalamin (VITAMIN B 12 PO) Take 1,000 mcg by mouth daily.     [name] donepezil (ARICEPT) 10 MG tablet Take 1 tablet (10 mg total) by mouth at bedtime. 90 tablet 3  . fish oil-omega-3 fatty acids 1000 MG capsule Take 1 g by mouth daily.    [name] GLUCOSAMINE-CHONDROITIN PO Take 1 tablet by mouth 3 (three) times daily. 1500-1200    . losartan-hydrochlorothiazide  (HYZAAR) 100-25 MG tablet Take 1 tablet by mouth daily. 90 tablet 3  . memantine (NAMENDA) 10 MG tablet Take 1 tablet (10 mg total) by mouth 2 (two) times daily. 180 tablet 3  . metoprolol succinate (TOPROL-XL) 100 MG 24 hr tablet Take 1 tablet (100 mg total) by mouth daily. Take with or immediately following a meal. 90 tablet 3  . Multiple Vitamins-Minerals (MULTIVITAMIN & MINERAL PO) Take 1 tablet by mouth daily.    [name] warfarin (COUMADIN) 5 MG tablet TAKE BY MOUTH AS DIRECTED BY COUMADIN CLINIC 90 tablet 2  . verapamil (CALAN-SR) 240 MG CR tablet Take 240 mg by mouth daily. (Patient not taking: Reported on 01/09/2020)     No facility-administered medications prior to visit.    Allergies:   Epinephrine   Social History   Socioeconomic History  . Marital status: Widowed    Spouse name: Mariane Masters Natividad  . Number of children: 3  . Years of education: [employer]  . Highest education level: Not on file  Occupational History  . Occupation: Education officer, environmental: RETIRED  Tobacco Use  . Smoking status: Former Smoker    Quit date: 07/10/1965    Years since quitting: 54.5  . Smokeless tobacco: Never Used  . Tobacco comment: 3 cigs per week x 3 years (started in 1964), 12/27/15 does not smoke  Vaping Use  . Vaping Use: Never used  Substance and Sexual Activity  . Alcohol use: Yes    Alcohol/week: 3.0 standard drinks    Types: 3 Glasses of wine per week    Comment: occasionally  . Drug use: No  . Sexual activity: Not on file  Other Topics Concern  . Not on file  Social History Narrative   03/26/19 Pt lives at home alone now, widow.   her spouse passed Dec 2019 Mariane Masters Carnell, who has been dx'd with mild cognitive impairment vs. mild dementia, former pt of [name]). She does not use caffeine, if so, rarely.   Social Determinants of Health   Financial Resource Strain:   . Difficulty of Paying Living Expenses:   Food Insecurity:   . Worried About Charity fundraiser in the Last Year:   .  Arboriculturist in the Last Year:   Transportation Needs: No Transportation Needs  . Lack of Transportation (Medical): No  . Lack of Transportation (Non-Medical): No  Physical Activity:   . Days of Exercise per Week:   . Minutes of Exercise per Session:   Stress:   . Feeling of Stress :   Social Connections:   . Frequency of Communication with Friends and Family:   . Frequency of Social Gatherings with Friends and Family:   . Attends Religious Services:   . Active Member of Clubs or Organizations:   . Attends Archivist Meetings:   [name] Marital Status:     Family  History:  The patient's family history includes Stroke in her mother.   ROS:   Please see the history of present illness.    ROS All other systems reviewed and are negative.  PHYSICAL EXAM:   VS:  BP 140/70   Pulse 72   Ht 5\' 2"  (1.575 m)   Wt 142 lb (64.4 kg)   BMI 25.97 kg/m    GEN: Well nourished, well developed, in no acute distress  HEENT: normal  Neck: no JVD, carotid bruits, or masses Cardiac: RRR; no murmurs, rubs, or gallops,no edema, poor peripheral pulses Respiratory:  clear to auscultation bilaterally, normal work of breathing GI: soft, nontender, nondistended, + BS MS: no deformity or atrophy  Skin: warm and dry, no rash Neuro:  Alert and Oriented x 3, Strength and sensation are intact Psych: euthymic mood, full affect  Wt Readings from Last 3 Encounters:  01/09/20 142 lb (64.4 kg)  12/26/19 147 lb 12.8 oz (67 kg)  11/05/19 144 lb (65.3 kg)    Studies/Labs Reviewed:   EKG: Is not ordered today  Recent Labs: 02/12/2019: TSH 0.32 09/10/2019: ALT 23; BUN 20; Creatinine, Ser 0.96; Hemoglobin 13.9; Platelets 244; Potassium 3.7; Sodium 142   Lipid Panel    Component Value Date/Time   CHOL 196 02/05/2019 0852   TRIG 186 (H) 02/05/2019 0852   HDL 50 02/05/2019 0852   CHOLHDL 3.9 02/05/2019 0852   CHOLHDL 4 02/13/2017 0905   VLDL 31.4 02/13/2017 0905   LDLCALC 109 (H) 02/05/2019 0852    Additional studies/ records that were reviewed today include:   TTE: 09/2016 - Left ventricle: The cavity size was normal. There was moderate   focal basal hypertrophy of the septum with mild posterior wall   hypertrophy. Systolic function was normal. The estimated ejection   fraction was in the range of 60% to 65%. Wall motion was normal;   there were no regional wall motion abnormalities. Doppler   parameters are consistent with abnormal left ventricular   relaxation (grade 1 diastolic dysfunction). Doppler parameters   are consistent with indeterminate ventricular filling pressure. - Aortic valve: Transvalvular velocity was within the normal range.   There was no stenosis. There was mild regurgitation. - Right ventricle: The cavity size was normal. Wall thickness was   normal. Systolic function was normal. - Atrial septum: No defect or patent foramen ovale was identified   by color flow Doppler. - Tricuspid valve: There was mild regurgitation. - Pulmonary arteries: Systolic pressure was within the normal   range. PA peak pressure: 33 mm Hg (S).   ASSESSMENT:    1. PAF (paroxysmal atrial fibrillation) ([place])   2. Hyperlipidemia, unspecified hyperlipidemia type   3. Essential hypertension   4. Chronic anticoagulation    PLAN:  In order of problems listed above:  1. Paroxysmal atrial fibrillation  -no recent episode, no palpitations, no bleeding on Coumadin, EKG today shows sinus rhythm.  Her INR is at goal and she has no bleeding. 2. Hypertension  -she is advised to take her Hyzaar in the morning and Toprol-XL at night. 3. Hyperlipidemia  - we wanted to discontinue her statin given progressively worsening memory impairment.  However the patient decided to continue. 4. Status post breast surgery in October 2020, status post lumpectomy to turn out to be benign lesion. Depression and memory impairment, the patient is currently on Namenda and Aricept, she is being followed by primary  care physician.  Medication Adjustments/Labs and Tests Ordered: Current medicines are reviewed  at length with the patient today.  Concerns regarding medicines are outlined above.  Medication changes, Labs and Tests ordered today are listed in the Patient Instructions below. Patient Instructions  Medication Instructions:  Your physician recommends that you continue on your current medications as directed. Please refer to the Current Medication list given to you today.  *If you need a refill on your cardiac medications before your next appointment, please call your pharmacy*  Follow-Up: At [facility], you and your health needs are our priority.  As part of our continuing mission to provide you with exceptional heart care, we have created designated Provider Care Teams.  These Care Teams include your primary Cardiologist (physician) and Advanced Practice Providers (APPs -  Physician Assistants and Nurse Practitioners) who all work together to provide you with the care you need, when you need it.  Your next appointment:   6 month(s)  The format for your next appointment:   In Person  Provider:   You may see Ena Dawley, MD or one of the following Advanced Practice Providers on your designated Care Team:    [name], PA-C  Ermalinda Barrios, PA-C       Signed, Ena Dawley, MD  01/09/2020 9:54 AM    [address], [place], [place]  [address] Phone: [phone]; Fax: [phone]

## 2020-01-15 DIAGNOSIS — M1711 Unilateral primary osteoarthritis, right knee: Secondary | ICD-10-CM | POA: Diagnosis not present

## 2020-01-22 ENCOUNTER — Encounter: Payer: Medicare Other | Admitting: Physician Assistant

## 2020-01-22 DIAGNOSIS — M1711 Unilateral primary osteoarthritis, right knee: Secondary | ICD-10-CM | POA: Diagnosis not present

## 2020-01-27 ENCOUNTER — Encounter (INDEPENDENT_AMBULATORY_CARE_PROVIDER_SITE_OTHER): Payer: Medicare Other | Admitting: Ophthalmology

## 2020-01-27 ENCOUNTER — Other Ambulatory Visit: Payer: Self-pay | Admitting: Oncology

## 2020-01-27 ENCOUNTER — Telehealth: Payer: Self-pay | Admitting: Oncology

## 2020-01-27 NOTE — Telephone Encounter (Signed)
Rescheduled 8/16 appts to 11/1 per phone conversation with GM. Pt confirmed new appt date and time.

## 2020-01-28 DIAGNOSIS — H40013 Open angle with borderline findings, low risk, bilateral: Secondary | ICD-10-CM | POA: Diagnosis not present

## 2020-01-28 DIAGNOSIS — H353232 Exudative age-related macular degeneration, bilateral, with inactive choroidal neovascularization: Secondary | ICD-10-CM | POA: Diagnosis not present

## 2020-01-29 ENCOUNTER — Other Ambulatory Visit: Payer: Self-pay | Admitting: Family Medicine

## 2020-01-29 DIAGNOSIS — M1711 Unilateral primary osteoarthritis, right knee: Secondary | ICD-10-CM | POA: Diagnosis not present

## 2020-02-02 ENCOUNTER — Encounter (INDEPENDENT_AMBULATORY_CARE_PROVIDER_SITE_OTHER): Payer: Self-pay | Admitting: Ophthalmology

## 2020-02-02 ENCOUNTER — Ambulatory Visit (INDEPENDENT_AMBULATORY_CARE_PROVIDER_SITE_OTHER): Payer: Medicare Other | Admitting: Ophthalmology

## 2020-02-02 ENCOUNTER — Other Ambulatory Visit: Payer: Self-pay

## 2020-02-02 DIAGNOSIS — H353211 Exudative age-related macular degeneration, right eye, with active choroidal neovascularization: Secondary | ICD-10-CM | POA: Diagnosis not present

## 2020-02-02 MED ORDER — BEVACIZUMAB CHEMO INJECTION 1.25MG/0.05ML SYRINGE FOR KALEIDOSCOPE
1.2500 mg | INTRAVITREAL | Status: AC | PRN
  Administered 2020-02-02: 1.25 mg via INTRAVITREAL

## 2020-02-02 NOTE — Assessment & Plan Note (Signed)
OD  improved and stable macular condition from wet AMD at 8-week interval post Avastin.  Examination OD in 10 weeks

## 2020-02-02 NOTE — Progress Notes (Signed)
02/02/2020     CHIEF COMPLAINT Patient presents for Retina Follow Up   HISTORY OF PRESENT ILLNESS: Cynthia Mathis is a 86 y.o. female who presents to the clinic today for:   HPI    Retina Follow Up    Patient presents with  Wet AMD.  In right eye.  This started 9 weeks ago.  Severity is mild.  Duration of 9 weeks.  Since onset it is stable.          Comments    9 Week AMD F/U OD, poss Avastin OD  Pt denies noticeable changes to [place] OU since last visit. Pt denies ocular pain, flashes of light, or floaters OU.         Last edited by [name], [place] on 02/02/2020  9:29 AM. (History)      Referring physician: [name], MD [place],  [place] [address]  HISTORICAL INFORMATION:   Selected notes from the MEDICAL RECORD NUMBER    Lab Results  Component Value Date   HGBA1C 6.6 (H) 02/12/2019     CURRENT MEDICATIONS: No current outpatient medications on file. (Ophthalmic Drugs)   No current facility-administered medications for this visit. (Ophthalmic Drugs)   Current Outpatient Medications (Other)  Medication Sig  . anastrozole (ARIMIDEX) 1 MG tablet Take 1 tablet (1 mg total) by mouth daily.  [name] atorvastatin (LIPITOR) 20 MG tablet Take 20 mg by mouth daily.  . Calcium Carbonate-Vitamin D (CALCIUM 600+D) 600-400 MG-UNIT per tablet Take 1 tablet by mouth daily.  . Cyanocobalamin (VITAMIN B 12 PO) Take 1,000 mcg by mouth daily.   [name] donepezil (ARICEPT) 10 MG tablet TAKE ONE TABLET BY MOUTH EVERY NIGHT AT BEDTIME  . fish oil-omega-3 fatty acids 1000 MG capsule Take 1 g by mouth daily.  [name] GLUCOSAMINE-CHONDROITIN PO Take 1 tablet by mouth 3 (three) times daily. 1500-1200  . losartan-hydrochlorothiazide (HYZAAR) 100-25 MG tablet Take 1 tablet by mouth daily.  . memantine (NAMENDA) 10 MG tablet Take 1 tablet (10 mg total) by mouth 2 (two) times daily.  . metoprolol succinate (TOPROL-XL) 100 MG 24 hr tablet Take 1 tablet (100 mg total) by mouth  daily. Take with or immediately following a meal.  . Multiple Vitamins-Minerals (MULTIVITAMIN & MINERAL PO) Take 1 tablet by mouth daily.  [name] warfarin (COUMADIN) 5 MG tablet TAKE BY MOUTH AS DIRECTED BY COUMADIN CLINIC   No current facility-administered medications for this visit. (Other)      REVIEW OF SYSTEMS:    ALLERGIES Allergies  Allergen Reactions  . Epinephrine Palpitations    heart racing    PAST MEDICAL HISTORY Past Medical History:  Diagnosis Date  . Anxiety   . Borderline diabetes mellitus   . Cancer ([place]) 2020   breast  . Diverticulosis of colon   . DJD (degenerative joint disease)   . Dyslipidemia   . History of hematuria   . History of osteoporosis   . Hx of colonic polyps   . Hypertension   . Hypothyroid   . Macular degeneration    sees [name] for general eye care, sees [name] for retinal injections   . Memory loss   . Osteoporosis    took bisphosphonates, now off. Last DEXA in 2015 showed osteopenia   . Paroxysmal atrial fibrillation (HCC)    sees [name]   . SCCA (squamous cell carcinoma) of skin 12/11/2019   Left Hand Posterior (in situ)   Past Surgical History:  Procedure Laterality Date  . APPENDECTOMY  1947  . BREAST LUMPECTOMY WITH RADIOACTIVE SEED LOCALIZATION Right 05/14/2019   Procedure: RIGHT BREAST LUMPECTOMY WITH RADIOACTIVE SEED LOCALIZATION;  Surgeon: [name], MD;  Location: [facility];  Service: General;  Laterality: Right;  . CATARACT EXTRACTION W/PHACO Bilateral   . COLONOSCOPY  12/02/2014   per [name], severe diverticulosis but no polyps   . MASS EXCISION  04/17/2012   Procedure: EXCISION MASS;  Surgeon: [name], MD;  Location: [facility];  Service: General;  Laterality: N/A;  . THYROIDECTOMY  1990   [name]    FAMILY HISTORY Family History  Problem Relation Age of Onset  . Stroke Mother   . Colon cancer Neg Hx   . Stomach cancer Neg Hx     SOCIAL HISTORY Social  History   Tobacco Use  . Smoking status: Former Smoker    Quit date: 07/10/1965    Years since quitting: 54.6  . Smokeless tobacco: Never Used  . Tobacco comment: 3 cigs per week x 3 years (started in 1964), 12/27/15 does not smoke  Vaping Use  . Vaping Use: Never used  Substance Use Topics  . Alcohol use: Yes    Alcohol/week: 3.0 standard drinks    Types: 3 Glasses of wine per week    Comment: occasionally  . Drug use: No         OPHTHALMIC EXAM:  Base Eye Exam    Visual Acuity (ETDRS)      Right Left   Dist [place] 20/40 20/30 +1   Dist ph  NI 20/30 +2       Tonometry (Tonopen, 9:32 AM)      Right Left   Pressure 18 14       Pupils      Pupils Dark Light Shape React APD   Right PERRL 2 2 Round Minimal None   Left PERRL 2 2 Round Minimal None       Visual Fields (Counting fingers)      Left Right    Full Full       Extraocular Movement      Right Left    Full Full       Neuro/Psych    Oriented x3: Yes   Mood/Affect: Normal       Dilation    Right eye: 1.0% Mydriacyl, 2.5% Phenylephrine @ 9:32 AM        Slit Lamp and Fundus Exam    External Exam      Right Left   External Normal Normal       Slit Lamp Exam      Right Left   Lids/Lashes Normal Normal   Conjunctiva/Sclera White and quiet White and quiet   Cornea Clear Clear   Anterior Chamber Deep and quiet Deep and quiet   Iris Round and reactive Round and reactive   Lens Posterior chamber intraocular lens Posterior chamber intraocular lens   Anterior Vitreous Normal Normal       Fundus Exam      Right Left   Posterior Vitreous Posterior vitreous detachment    Disc Normal    C/D Ratio 0.3    Macula Atrophy, Geographic atrophy, no macular thickening, no hemorrhage, no exudates    Vessels Normal    Periphery Normal           IMAGING AND PROCEDURES  Imaging and Procedures for 02/02/20  OCT, Retina - OU - Both Eyes  Right Eye Quality was good. Scan locations included  subfoveal. Central Foveal Thickness: 207. Progression has improved. Findings include no IRF, no SRF.   Left Eye Quality was good. Scan locations included subfoveal. Central Foveal Thickness: 235. Progression has improved. Findings include no SRF, no IRF, outer retinal atrophy.   Notes OD  improved and stable macular condition from wet AMD at 8-week interval post Avastin.  OS is stable at 1 month post recent injection intravitreal Avastin.       Intravitreal Injection, Pharmacologic Agent - OD - Right Eye       Time Out 02/02/2020. 10:32 AM. Confirmed correct patient, procedure, site, and patient consented.   Anesthesia Topical anesthesia was used. Anesthetic medications included [place]ten 3.5%.   Procedure Preparation included 5% betadine to ocular surface, 10% betadine to eyelids, Tobramycin 0.3%, Ofloxacin . A 30 gauge needle was used.   Injection:  1.25 mg Bevacizumab (AVASTIN) SOLN   NDC: [address]-3, Lot: [id number]   Route: Intravitreal, Site: Right Eye, Waste: 0 mg  Post-op Post injection exam found visual acuity of at least counting fingers. The patient tolerated the procedure well. There were no complications. The patient received written and verbal post procedure care education. Post injection medications were not given.                 ASSESSMENT/PLAN:  Exudative age-related macular degeneration of right eye with active choroidal neovascularization (HCC) OD  improved and stable macular condition from wet AMD at 8-week interval post Avastin.  Examination OD in 10 weeks       ICD-10-CM   1. Exudative age-related macular degeneration of right eye with active choroidal neovascularization (HCC)  H35.3211 OCT, Retina - OU - Both Eyes    Intravitreal Injection, Pharmacologic Agent - OD - Right Eye    Bevacizumab (AVASTIN) SOLN 1.25 mg    1.  Improved macular anatomy OD, on intravitreal Avastin currently at 8-week and with no intraretinal or subretinal fluid.  Atrophy  does exist however.  Repeat intravitreal Avastin OD today and examination in 10 weeks  2.  Dilate OS next time as scheduled  3.  Ophthalmic Meds Ordered this visit:  Meds ordered this encounter  Medications  . Bevacizumab (AVASTIN) SOLN 1.25 mg       Return in about 10 weeks (around 04/12/2020) for DILATE OU, AVASTIN OCT, OD.  There are no Patient Instructions on file for this visit.   Explained the diagnoses, plan, and follow up with the patient and they expressed understanding.  Patient expressed understanding of the importance of proper follow up care.   Clent Demark Shalva Rozycki M.D. Diseases & Surgery of the Retina and Vitreous [facility] [place] 02/02/20     Abbreviations: M myopia (nearsighted); A astigmatism; H hyperopia (farsighted); P presbyopia; Mrx spectacle prescription;  CTL contact lenses; OD right eye; OS left eye; OU both eyes  XT exotropia; ET esotropia; PEK punctate epithelial keratitis; PEE punctate epithelial erosions; DES dry eye syndrome; MGD meibomian gland dysfunction; ATs artificial tears; PFAT's preservative free artificial tears; [place] nuclear sclerotic cataract; PSC posterior subcapsular cataract; ERM epi-retinal membrane; PVD posterior vitreous detachment; RD retinal detachment; DM diabetes mellitus; DR diabetic retinopathy; NPDR non-proliferative diabetic retinopathy; PDR proliferative diabetic retinopathy; CSME clinically significant macular edema; DME diabetic macular edema; dbh dot blot hemorrhages; CWS cotton wool spot; POAG primary open angle glaucoma; C/D cup-to-disc ratio; HVF humphrey visual field; GVF goldmann visual field; OCT optical coherence tomography; IOP intraocular pressure; BRVO Branch retinal vein  occlusion; CRVO central retinal vein occlusion; CRAO central retinal artery occlusion; BRAO branch retinal artery occlusion; RT retinal tear; SB scleral buckle; PPV pars plana vitrectomy; VH Vitreous hemorrhage; PRP panretinal laser  photocoagulation; IVK intravitreal kenalog; VMT vitreomacular traction; MH Macular hole;  NVD neovascularization of the disc; NVE neovascularization elsewhere; AREDS age related eye disease study; ARMD age related macular degeneration; POAG primary open angle glaucoma; EBMD epithelial/anterior basement membrane dystrophy; ACIOL anterior chamber intraocular lens; IOL intraocular lens; PCIOL posterior chamber intraocular lens; Phaco/IOL phacoemulsification with intraocular lens placement; [place] photorefractive keratectomy; LASIK laser assisted in situ keratomileusis; HTN hypertension; DM diabetes mellitus; COPD chronic obstructive pulmonary disease

## 2020-02-04 ENCOUNTER — Ambulatory Visit: Payer: Medicare Other

## 2020-02-10 ENCOUNTER — Telehealth: Payer: Self-pay | Admitting: Family Medicine

## 2020-02-10 DIAGNOSIS — I480 Paroxysmal atrial fibrillation: Secondary | ICD-10-CM

## 2020-02-10 DIAGNOSIS — I10 Essential (primary) hypertension: Secondary | ICD-10-CM

## 2020-02-10 NOTE — Telephone Encounter (Signed)
-----   Message from Germaine Pomfret, [facility] sent at 02/10/2020 12:35 PM EDT ----- Regarding: CCM Referral Dr. Sarajane Jews,  Can you please place a referral to chronic care management for this patient?  Thanks, Doristine Section Clinical Pharmacist [facility]

## 2020-02-10 NOTE — Telephone Encounter (Signed)
Done

## 2020-02-10 NOTE — Progress Notes (Signed)
°  Chronic Care Management   Note  02/10/2020 Name: Cynthia Mathis MRN: [id number] DOB: 1933/04/02  Cynthia Mathis is a 86 y.o. year old female who is a primary care patient of [name], MD. I reached out to [place] by phone today in response to a referral sent by Cynthia Mathis's PCP, [name], MD.   Cynthia Mathis was given information about Chronic Care Management services today including:  1. CCM service includes personalized support from designated clinical staff supervised by her physician, including individualized plan of care and coordination with other care providers 2. 24/7 contact phone numbers for assistance for urgent and routine care needs. 3. Service will only be billed when office clinical staff spend 20 minutes or more in a month to coordinate care. 4. Only one practitioner may furnish and bill the service in a calendar month. 5. The patient may stop CCM services at any time (effective at the end of the month) by phone call to the office staff.   Patient agreed to services and verbal consent obtained.   Follow up plan:   Carley Perdue [employer] Scheduler

## 2020-02-13 ENCOUNTER — Ambulatory Visit: Payer: Medicare Other

## 2020-02-20 ENCOUNTER — Other Ambulatory Visit: Payer: Self-pay

## 2020-02-20 ENCOUNTER — Ambulatory Visit: Payer: Medicare Other | Admitting: *Deleted

## 2020-02-20 DIAGNOSIS — Z5181 Encounter for therapeutic drug level monitoring: Secondary | ICD-10-CM

## 2020-02-20 DIAGNOSIS — I480 Paroxysmal atrial fibrillation: Secondary | ICD-10-CM

## 2020-02-20 LAB — POCT INR: INR: 4.2 — AB (ref 2.0–3.0)

## 2020-02-20 NOTE — Patient Instructions (Addendum)
Description    Hold warfarin today and then continue taking 1/2 tablet daily except 1 tablet on Wednesdays. Eat an extra serving of greens today.  Recheck INR in 2 weeks. with any medication changes or if scheduled for any procedures [phone].

## 2020-02-23 ENCOUNTER — Ambulatory Visit: Payer: Medicare Other | Admitting: Oncology

## 2020-02-23 ENCOUNTER — Other Ambulatory Visit: Payer: Medicare Other

## 2020-03-05 ENCOUNTER — Other Ambulatory Visit: Payer: Self-pay

## 2020-03-05 ENCOUNTER — Ambulatory Visit: Payer: Medicare Other | Admitting: Pharmacist

## 2020-03-05 DIAGNOSIS — I480 Paroxysmal atrial fibrillation: Secondary | ICD-10-CM | POA: Diagnosis not present

## 2020-03-05 DIAGNOSIS — Z5181 Encounter for therapeutic drug level monitoring: Secondary | ICD-10-CM | POA: Diagnosis not present

## 2020-03-05 LAB — POCT INR: INR: 2.8 (ref 2.0–3.0)

## 2020-03-05 NOTE — Patient Instructions (Signed)
Continue taking 1/2 tablet daily except 1 tablet on Wednesdays. Eat an extra serving of greens today.  Recheck INR in 3 weeks. with any medication changes or if scheduled for any procedures [phone].

## 2020-03-11 ENCOUNTER — Ambulatory Visit (INDEPENDENT_AMBULATORY_CARE_PROVIDER_SITE_OTHER): Payer: Medicare Other | Admitting: Physician Assistant

## 2020-03-11 ENCOUNTER — Other Ambulatory Visit: Payer: Self-pay

## 2020-03-11 ENCOUNTER — Encounter: Payer: Self-pay | Admitting: Physician Assistant

## 2020-03-11 DIAGNOSIS — D049 Carcinoma in situ of skin, unspecified: Secondary | ICD-10-CM

## 2020-03-11 DIAGNOSIS — L570 Actinic keratosis: Secondary | ICD-10-CM

## 2020-03-11 DIAGNOSIS — D0462 Carcinoma in situ of skin of left upper limb, including shoulder: Secondary | ICD-10-CM

## 2020-03-11 MED ORDER — FLUOROURACIL 5 % EX CREA: TOPICAL_CREAM | Freq: Every day | CUTANEOUS | 1 refills | Status: DC

## 2020-03-11 NOTE — Progress Notes (Signed)
   Follow up Visit  Subjective  Cynthia Mathis is a 86 y.o. female who presents for the following: Procedure (CIS x 1 left hand). The area has healed well.  Objective  Well appearing patient in no apparent distress; mood and affect are within normal limits.  A focused examination was performed including hands. Relevant physical exam findings are noted in the Assessment and Plan.   Objective  Left Hand - Posterior: Biopsy site appears clear. There are pink scaling papules nearby and also on the dorsum of the right hand.      Objective  Left Hand - Posterior, Right Hand - Posterior: Erythematous patches with gritty scale.  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 She will treat both hands with 5FU cream nightly for 3-4 weeks. We can treat this lesion at a later date if it recurs or we may biopsy any lesions that are leftover on the other hand.   AK (actinic keratosis) (2) Left Hand - Posterior; Right Hand - Posterior  fluorouracil (EFUDEX) 5 % cream - Left Hand - Posterior, Right Hand - Posterior

## 2020-03-16 ENCOUNTER — Other Ambulatory Visit: Payer: Self-pay

## 2020-03-16 ENCOUNTER — Ambulatory Visit (INDEPENDENT_AMBULATORY_CARE_PROVIDER_SITE_OTHER): Payer: Medicare Other | Admitting: Ophthalmology

## 2020-03-16 ENCOUNTER — Encounter (INDEPENDENT_AMBULATORY_CARE_PROVIDER_SITE_OTHER): Payer: Self-pay | Admitting: Ophthalmology

## 2020-03-16 DIAGNOSIS — H353221 Exudative age-related macular degeneration, left eye, with active choroidal neovascularization: Secondary | ICD-10-CM | POA: Diagnosis not present

## 2020-03-16 DIAGNOSIS — H353211 Exudative age-related macular degeneration, right eye, with active choroidal neovascularization: Secondary | ICD-10-CM

## 2020-03-16 MED ORDER — BEVACIZUMAB CHEMO INJECTION 1.25MG/0.05ML SYRINGE FOR KALEIDOSCOPE
1.2500 mg | INTRAVITREAL | Status: AC | PRN
  Administered 2020-03-16: 1.25 mg via INTRAVITREAL

## 2020-03-16 NOTE — Assessment & Plan Note (Signed)
Patient much improved and stable left eye at 10-week follow-up interval.  Will extend interval of examination left eye in 3 months next and possible injection intravitreal Avastin after today's injection

## 2020-03-16 NOTE — Patient Instructions (Signed)
To report promptly any new visual acuity distortions or declines either eye

## 2020-03-16 NOTE — Assessment & Plan Note (Signed)
Follow-up OD as scheduled °

## 2020-03-16 NOTE — Progress Notes (Signed)
03/16/2020     CHIEF COMPLAINT Patient presents for Retina Follow Up   HISTORY OF PRESENT ILLNESS: Cynthia Mathis is a 86 y.o. female who presents to the clinic today for:   HPI    Retina Follow Up    Patient presents with  Wet AMD.  In left eye.  This started 10 weeks ago.  Severity is mild.  Duration of 10 weeks.  Since onset it is stable.          Comments    10 Week AMD F/U OS, poss Avastin OS  Pt denies noticeable changes to [place] OU since last visit. Pt denies ocular pain, flashes of light, or floaters OU.         Last edited by [name], [place] on 03/16/2020  9:58 AM. (History)      Referring physician: [name], MD [place],  [name] [id number]  HISTORICAL INFORMATION:   Selected notes from the MEDICAL RECORD NUMBER    Lab Results  Component Value Date   HGBA1C 6.6 (H) 02/12/2019     CURRENT MEDICATIONS: No current outpatient medications on file. (Ophthalmic Drugs)   No current facility-administered medications for this visit. (Ophthalmic Drugs)   Current Outpatient Medications (Other)  Medication Sig  . anastrozole (ARIMIDEX) 1 MG tablet Take 1 tablet (1 mg total) by mouth daily.  . Calcium Carbonate-Vitamin D (CALCIUM 600+D) 600-400 MG-UNIT per tablet Take 1 tablet by mouth daily.  . Cyanocobalamin (VITAMIN B 12 PO) Take 1,000 mcg by mouth daily.   [name] donepezil (ARICEPT) 10 MG tablet TAKE ONE TABLET BY MOUTH EVERY NIGHT AT BEDTIME  . fish oil-omega-3 fatty acids 1000 MG capsule Take 1 g by mouth daily.  . fluorouracil (EFUDEX) 5 % cream Apply topically daily. Apply QHS x 3-4 weeks expect irritation.  [name] GLUCOSAMINE-CHONDROITIN PO Take 1 tablet by mouth 3 (three) times daily. 1500-1200  . losartan-hydrochlorothiazide (HYZAAR) 100-25 MG tablet Take 1 tablet by mouth daily.  . memantine (NAMENDA) 10 MG tablet Take 1 tablet (10 mg total) by mouth 2 (two) times daily.  . metoprolol succinate (TOPROL-XL) 100 MG 24 hr tablet Take 1  tablet (100 mg total) by mouth daily. Take with or immediately following a meal.  . Multiple Vitamins-Minerals (MULTIVITAMIN & MINERAL PO) Take 1 tablet by mouth daily.  [name] warfarin (COUMADIN) 5 MG tablet TAKE BY MOUTH AS DIRECTED BY COUMADIN CLINIC   No current facility-administered medications for this visit. (Other)      REVIEW OF SYSTEMS:    ALLERGIES Allergies  Allergen Reactions  . Epinephrine Palpitations    heart racing    PAST MEDICAL HISTORY Past Medical History:  Diagnosis Date  . Anxiety   . Borderline diabetes mellitus   . Cancer ([facility]) 2020   breast  . Diverticulosis of colon   . DJD (degenerative joint disease)   . Dyslipidemia   . History of hematuria   . History of osteoporosis   . Hx of colonic polyps   . Hypertension   . Hypothyroid   . Macular degeneration    sees [name] for general eye care, sees [name] for retinal injections   . Memory loss   . Osteoporosis    took bisphosphonates, now off. Last DEXA in 2015 showed osteopenia   . Paroxysmal atrial fibrillation (HCC)    sees [name]   . SCCA (squamous cell carcinoma) of skin 12/11/2019   Left Hand Posterior (in situ)  Past Surgical History:  Procedure Laterality Date  . APPENDECTOMY  1947  . BREAST LUMPECTOMY WITH RADIOACTIVE SEED LOCALIZATION Right 05/14/2019   Procedure: RIGHT BREAST LUMPECTOMY WITH RADIOACTIVE SEED LOCALIZATION;  Surgeon: [name], MD;  Location: [facility];  Service: General;  Laterality: Right;  . CATARACT EXTRACTION W/PHACO Bilateral   . COLONOSCOPY  12/02/2014   per [name], severe diverticulosis but no polyps   . MASS EXCISION  04/17/2012   Procedure: EXCISION MASS;  Surgeon: [name], MD;  Location: [facility];  Service: General;  Laterality: N/A;  . THYROIDECTOMY  1990   [name]    FAMILY HISTORY Family History  Problem Relation Age of Onset  . Stroke Mother   . Colon cancer Neg Hx   . Stomach cancer Neg Hx      SOCIAL HISTORY Social History   Tobacco Use  . Smoking status: Former Smoker    Quit date: 07/10/1965    Years since quitting: 54.7  . Smokeless tobacco: Never Used  . Tobacco comment: 3 cigs per week x 3 years (started in 1964), 12/27/15 does not smoke  Vaping Use  . Vaping Use: Never used  Substance Use Topics  . Alcohol use: Yes    Alcohol/week: 3.0 standard drinks    Types: 3 Glasses of wine per week    Comment: occasionally  . Drug use: No         OPHTHALMIC EXAM:  Base Eye Exam    Visual Acuity (ETDRS)      Right Left   Dist [place] 20/40 -1 20/25 -2   Dist ph [place] NI        Tonometry (Tonopen, 10:00 AM)      Right Left   Pressure 11 11       Pupils      Pupils Dark Light Shape React APD   Right PERRL 2 2 Round Minimal None   Left PERRL 2 2 Round Minimal None       Visual Fields (Counting fingers)      Left Right    Full Full       Extraocular Movement      Right Left    Full Full       Neuro/Psych    Oriented x3: Yes   Mood/Affect: Normal       Dilation    Left eye: 1.0% Mydriacyl, 2.5% Phenylephrine @ 10:01 AM        Slit Lamp and Fundus Exam    External Exam      Right Left   External  Normal       Slit Lamp Exam      Right Left   Lids/Lashes Normal Normal   Conjunctiva/Sclera White and quiet White and quiet   Cornea Clear Clear   Anterior Chamber Deep and quiet Deep and quiet   Iris Round and reactive Round and reactive   Lens Clear Posterior chamber intraocular lens   Anterior Vitreous Normal Normal       Fundus Exam      Right Left   Posterior Vitreous  Posterior vitreous detachment   Disc  Normal   C/D Ratio  0.25   Macula  Drusen, Soft drusen, Atrophy, Geographic atrophy, no macular thickening, no hemorrhage, Mottling, Retinal pigment epithelial mottling   Vessels  Normal   Periphery  Normal          IMAGING AND PROCEDURES  Imaging and Procedures for 03/16/20  OCT,  Retina - OU - Both Eyes       Right  Eye Quality was good. Scan locations included subfoveal. Central Foveal Thickness: 207. Progression has improved. Findings include no IRF, no SRF.   Left Eye Quality was good. Scan locations included subfoveal. Central Foveal Thickness: 235. Progression has improved. Findings include no SRF, no IRF, outer retinal atrophy.   Notes OD  improved and stable macular condition from wet AMD at 5-week interval post Avastin.  OS is stable at 10 week, 2 month post recent injection intravitreal Avastin.  Repeat injection OS, now,        Intravitreal Injection, Pharmacologic Agent - OS - Left Eye       Time Out 03/16/2020. 10:26 AM. Confirmed correct patient, procedure, site, and patient consented.   Anesthesia Topical anesthesia was used. Anesthetic medications included Ak[place] 3.5%.   Procedure Preparation included 10% betadine to eyelids, Tobramycin 0.3%, Ofloxacin , 5% betadine to ocular surface. A 30 gauge needle was used.   Injection:  1.25 mg Bevacizumab (AVASTIN) SOLN   NDC: [address]-5, Lot: [id number]   Route: Intravitreal, Site: Left Eye, Waste: 0 mg  Post-op Post injection exam found visual acuity of at least counting fingers. The patient tolerated the procedure well. There were no complications. The patient received written and verbal post procedure care education. Post injection medications were not given.                 ASSESSMENT/PLAN:  Exudative age-related macular degeneration of left eye with active choroidal neovascularization ([place]) Patient much improved and stable left eye at 10-week follow-up interval.  Will extend interval of examination left eye in 3 months next and possible injection intravitreal Avastin after today's injection  Exudative age-related macular degeneration of right eye with active choroidal neovascularization ([place]) Follow-up OD as scheduled      ICD-10-CM   1. Exudative age-related macular degeneration of left eye with active choroidal  neovascularization (HCC)  H35.3221 OCT, Retina - OU - Both Eyes    Intravitreal Injection, Pharmacologic Agent - OS - Left Eye    Bevacizumab (AVASTIN) SOLN 1.25 mg  2. Exudative age-related macular degeneration of right eye with active choroidal neovascularization (HCC)  H35.3211     1.  OS, clinically and by OCT much improved macular findings with stable acuity.  Examination will repeat injection today and extend exam interval OS to 3 months  2.  OD as scheduled next  3.  Ophthalmic Meds Ordered this visit:  Meds ordered this encounter  Medications  . Bevacizumab (AVASTIN) SOLN 1.25 mg       Return in about 3 months (around 06/15/2020) for dilate, OS, AVASTIN OCT.  Patient Instructions  To report promptly any new visual acuity distortions or declines either eye    Explained the diagnoses, plan, and follow up with the patient and they expressed understanding.  Patient expressed understanding of the importance of proper follow up care.   Clent Demark [place] M.D. Diseases & Surgery of the Retina and Vitreous Retina & Diabetic [place] 03/16/20     Abbreviations: M myopia (nearsighted); A astigmatism; H hyperopia (farsighted); P presbyopia; Mrx spectacle prescription;  CTL contact lenses; OD right eye; OS left eye; OU both eyes  XT exotropia; ET esotropia; PEK punctate epithelial keratitis; PEE punctate epithelial erosions; DES dry eye syndrome; MGD meibomian gland dysfunction; ATs artificial tears; PFAT's preservative free artificial tears; [place] nuclear sclerotic cataract; PSC posterior subcapsular cataract; ERM epi-retinal membrane; PVD posterior vitreous detachment; RD retinal  detachment; DM diabetes mellitus; DR diabetic retinopathy; NPDR non-proliferative diabetic retinopathy; PDR proliferative diabetic retinopathy; CSME clinically significant macular edema; DME diabetic macular edema; dbh dot blot hemorrhages; CWS cotton wool spot; POAG primary open angle glaucoma; C/D  cup-to-disc ratio; HVF humphrey visual field; GVF goldmann visual field; OCT optical coherence tomography; IOP intraocular pressure; BRVO Branch retinal vein occlusion; CRVO central retinal vein occlusion; CRAO central retinal artery occlusion; BRAO branch retinal artery occlusion; RT retinal tear; SB scleral buckle; PPV pars plana vitrectomy; VH Vitreous hemorrhage; PRP panretinal laser photocoagulation; IVK intravitreal kenalog; VMT vitreomacular traction; MH Macular hole;  NVD neovascularization of the disc; NVE neovascularization elsewhere; AREDS age related eye disease study; ARMD age related macular degeneration; POAG primary open angle glaucoma; EBMD epithelial/anterior basement membrane dystrophy; ACIOL anterior chamber intraocular lens; IOL intraocular lens; PCIOL posterior chamber intraocular lens; Phaco/IOL phacoemulsification with intraocular lens placement; [place] photorefractive keratectomy; LASIK laser assisted in situ keratomileusis; HTN hypertension; DM diabetes mellitus; COPD chronic obstructive pulmonary disease

## 2020-03-22 NOTE — Chronic Care Management (AMB) (Signed)
Chronic Care Management Pharmacy  Name: Cynthia Mathis  MRN: [id number] DOB: [birth year]   Chief Complaint/ HPI  Cynthia Mathis,  86 y.o. , female presents for their Initial CCM visit with the clinical pharmacist In office. She presents with her son, [name] who helps provide some of the history.   PCP : [name], MD Patient Care Team: [name], MD as PCP - General (Family Medicine) [name], MD as PCP - Cardiology (Cardiology) [name], [name], MD as Consulting Physician (Oncology) [name], MD as Consulting Physician (General Surgery) [name], RN as Oncology Nurse Navigator [name], MD as Consulting Physician (Cardiology) Arlyss Gandy, PA-C (Dermatology) Germaine Pomfret, [facility] as Pharmacist (Pharmacist)  Their chronic conditions include: Hypertension, Hyperlipidemia, Atrial Fibrillation, Hypothyroidism, Osteopenia, Osteoarthritis and Mild Cognitive Impairment   Office Visits: 12/26/19: Patient presented to Dr. Sarajane Jews for follow-up. Losartan-HCTZ changed to 1/2 tablet twice daily  11/05/19: Patient presented to Dorothyann Peng, NP for follow-up. Home BP 150s and bradycardic.   Consult Visit: 03/16/20: Patient presented to Dr. Marissa Nestle (Ophthalmology) for follow-up.  03/11/20: Patient presented to Arlyss Gandy, PA-C (dermatology) for squamous cell carcinoma follow-up. Fluoruracil 5% started, atorvastatin stopped.  01/09/20: Patient presented to Dr. Meda Coffee for follow-up. Verpamil stopped.  12/11/19:Patient presented to Arlyss Gandy, PA-C (dermatology) for squamous cell carcinoma follow-up. Skin biopsy performed.   Allergies  Allergen Reactions  . Epinephrine Palpitations    heart racing    Medications: Outpatient Encounter Medications as of 03/24/2020  Medication Sig  . anastrozole (ARIMIDEX) 1 MG tablet Take 1 tablet (1 mg total) by mouth daily.  . Calcium Carbonate-Vitamin D (CALCIUM 600+D) 600-400  MG-UNIT per tablet Take 1 tablet by mouth daily.  . Cyanocobalamin (VITAMIN B 12) 500 MCG TABS Take 1,000 mcg by mouth daily.   [name] donepezil (ARICEPT) 10 MG tablet TAKE ONE TABLET BY MOUTH EVERY NIGHT AT BEDTIME  . fish oil-omega-3 fatty acids 1000 MG capsule Take 1 g by mouth daily.  . fluorouracil (EFUDEX) 5 % cream Apply topically daily. Apply QHS x 3-4 weeks expect irritation.  [name] GLUCOSAMINE-CHONDROITIN PO Take 1 tablet by mouth 3 (three) times daily. 1500-1200  . losartan-hydrochlorothiazide (HYZAAR) 100-25 MG tablet Take 1 tablet by mouth daily.  . memantine (NAMENDA) 10 MG tablet Take 1 tablet (10 mg total) by mouth 2 (two) times daily.  . metoprolol succinate (TOPROL-XL) 100 MG 24 hr tablet Take 1 tablet (100 mg total) by mouth daily. Take with or immediately following a meal.  . Multiple Vitamins-Minerals (MULTIVITAMIN & MINERAL PO) Take 1 tablet by mouth daily.  [name] warfarin (COUMADIN) 5 MG tablet TAKE BY MOUTH AS DIRECTED BY COUMADIN CLINIC   No facility-administered encounter medications on file as of 03/24/2020.    Wt Readings from Last 3 Encounters:  01/09/20 142 lb (64.4 kg)  12/26/19 147 lb 12.8 oz (67 kg)  11/05/19 144 lb (65.3 kg)    Current Diagnosis/Assessment:  SDOH Interventions     Most Recent Value  SDOH Interventions  Financial Strain Interventions Intervention Not Indicated  Transportation Interventions Intervention Not Indicated     Goals Addressed            This Visit's Progress   . Chronic Care Management       CARE PLAN ENTRY (see longitudinal plan of care for additional care plan information)  Current Barriers:  . Chronic Disease Management support, education, and care coordination needs related to Hypertension, Hyperlipidemia, Atrial Fibrillation, Hypothyroidism, Osteopenia,  Osteoarthritis and Mild Cognitive Impairment    Hypertension BP Readings from Last 3 Encounters:  01/09/20 140/70  12/26/19 130/70  11/05/19 (!) 154/78   . Pharmacist  Clinical Goal(s): o Over the next 90 days, patient will work with PharmD and providers to maintain BP goal <140/90 . Current regimen:  . Losartan- HCTZ 100-25 mg daily . Metoprolol Succinate 100 mg daily  . Interventions: o Discussed low salt diet and exercising as tolerated extensively o Recommend decreasing metoprolol to 50 mg daily . Patient self care activities - Over the next 90 days, patient will: o Check blood pressure 3-5 times weekly, document, and provide at future appointments o Ensure daily salt intake < 2300 mg/day  Hyperlipidemia Lab Results  Component Value Date/Time   LDLCALC 109 (H) 02/05/2019 08:52 AM   . Pharmacist Clinical Goal(s): o Over the next 90 days, patient will work with PharmD and providers to achieve LDL goal < 100 . Current regimen:  o Fish Oil 1000 mg daily . Interventions: o Discussed low cholesterol diet and exercising as tolerated extensively o Recommend rechecking cholesterol levels to know if we should continue fish oil  Medication management . Pharmacist Clinical Goal(s): o Over the next 90 days, patient will work with PharmD and providers to maintain optimal medication adherence . [facility] . Interventions o Comprehensive medication review performed. o Continue current medication management strategy . Patient self care activities - Over the next 90 days, patient will: o Take medications as prescribed o Report any questions or concerns to PharmD and/or provider(s)       AFIB   Patient is currently rate controlled. Heart Rate 50-70s   Patient has failed these meds in past: n/a Patient is currently controlled on the following medications:   [name] Metoprolol Succinate 100 mg daily  . Warfarin 5 mg Wed, 2.5 mg all other days.   We discussed:  Denies unusual bruising/bleeding. Patient with history of bradycardia. Patient states she can "feel" her heart rate is low but denies other symptoms.   Plan  Recommend  decreasing metoprolol to 50 mg daily due to risk of bradycardia  Hypertension   BP goal is:  <140/90  Office blood pressures are  BP Readings from Last 3 Encounters:  01/09/20 140/70  12/26/19 130/70  11/05/19 (!) 154/78   Patient checks BP at home 3-5x per week Patient home BP readings are ranging: n/a  Patient has failed these meds in the past: n/a Patient is currently controlled on the following medications:  . Losartan- HCTZ 100-25 mg daily . Metoprolol Succinate 100 mg daily (EM)   We discussed diet and exercise extensively. Patient does not follow low-sodium diet. States she eats lots of greens and that she eats out at [employer] about 3x weekly.    Patient mostly sedentary aside from chores around her house.   Plan  Recommend decreasing metoprolol to 50 mg daily due to risk of bradycardia     Hyperlipidemia   LDL goal < 100  Lipid Panel     Component Value Date/Time   CHOL 196 02/05/2019 0852   TRIG 186 (H) 02/05/2019 0852   HDL 50 02/05/2019 0852   LDLCALC 109 (H) 02/05/2019 0852    Hepatic Function Latest Ref Rng & Units 09/10/2019 06/12/2019 03/26/2019  Total Protein 6.5 - 8.1 g/dL 7.2 7.0 7.1  Albumin 3.5 - 5.0 g/dL 4.1 3.9 3.9  AST 15 - 41 U/L _0 ALT 0 - 44 U/L 23  24 17  Alk Phosphatase 38 - 126 U/L 71 65 77  Total Bilirubin 0.3 - 1.2 mg/dL 0.6 0.6 0.3  Bilirubin, Direct 0.0 - 0.3 mg/dL - - -     The ASCVD Risk score [name] DC Jr., et al., 2013) failed to calculate for the following reasons:   The 2013 ASCVD risk score is only valid for ages 43 to 80   Patient has failed these meds in past: Atorvastatin (no longer needed)  Patient is currently uncontrolled on the following medications:  . Fish Oil 1000 mg daily  We discussed:  diet and exercise extensively. Patient not following low-cholesterol diet, endorses eating out frequently.   Plan  Continue current medications  Recommend rechecking fasting lipid panel to see if fish oil still  necessary.   Bone Health   Last DEXA Scan: 06/16/19   T-Score femoral neck: -0.7  T-Score total hip: -0.4  T-Score lumbar spine: +02  T-Score forearm radius: n/a  10-year probability of major osteoporotic fracture: n/a  10-year probability of hip fracture: n/a  Vit D, 25-Hydroxy  Date Value Ref Range Status  06/12/2019 52.64 30 - 100 ng/mL Final    Comment:    (NOTE) Vitamin D deficiency has been defined by the Institute of Medicine  and an Endocrine Society practice guideline as a level of serum 25-OH  vitamin D less than 20 ng/mL (1,2). The Endocrine Society went on to  further define vitamin D insufficiency as a level between 21 and 29  ng/mL (2). 1. IOM (Institute of Medicine). 2010. Dietary reference intakes for  calcium and D. [place]: The [employer]. 2. Holick MF, Binkley [place], Bischoff-Ferrari HA, et al. Evaluation,  treatment, and prevention of vitamin D deficiency: an Endocrine  Society clinical practice guideline, JCEM. 2011 Jul; 96(7): 1911-30. Performed at [facility], [place] [address] [place], [place] [address]      Patient is not a candidate for pharmacologic treatment  Patient has failed these meds in past: n/a Patient is currently controlled on the following medications:  . Calcium Carbonate + D 600 mg -400 units daily   We discussed:  Recommend [phone] units of vitamin D daily. Recommend 1200 mg of calcium daily from dietary and supplemental sources.  Plan  Continue current medications  Memory   Patient has failed these meds in past: n/a Patient is currently controlled on the following medications:  . Donepezil 10 mg QHS . Memantine 10 mg twice daily   We discussed:  Patient's son states patient continues to have troubles with short-term memory but otherwise feels memory has been stable.   Plan  Continue current medications  History of Skin/Breast Cancer   Patient has failed these meds in past: n/a Patient is  currently controlled on the following medications:  . Anastrazole 53m daily  . Fluorauracil 5% cream   We discussed:  N/a   Plan  Continue current medications  Osteoarthritis   Patient has failed these meds in past: n/a Patient is currently controlled on the following medications:  .Marland KitchenGlucoasmine-Chondroitin three times daily   We discussed:  Patient has been on glucosamine-chondroitin for quite some time, does not feel it has given her much benefit. Patient does not use any other OTC or PRN products to manage her osteoarthritis.   Plan  Recommend stopping glucosamine-chondroitin (ineffective)  Recommend starting Tylenol Arthritis 650 mg PRN 1-2 hours prior to physical activity  Misc / OTC    . Vitamin B12 1000 mcg  daily  . Multivitamin daily (Centrums Silver) AM   Plan  Continue current medications  Medication Management   Pt uses [facility] for all medications Uses pill box? Yes, which son helps fill.  Pt endorses 100% compliance  We discussed: Patient's son reported a few instances where she was confused about how to take her medications. Has not happened recently.    Plan  Continue current medication management strategy  Follow up: 12 month phone visit  [facility] [place]

## 2020-03-23 ENCOUNTER — Ambulatory Visit: Payer: Medicare Other

## 2020-03-23 ENCOUNTER — Telehealth: Payer: Self-pay

## 2020-03-23 NOTE — Progress Notes (Signed)
Left a voice message for patient to confirmed patient office appointment on 03/24/2020 for CCM at 9:00 AM with Junius Argyle the Clinical pharmacist.   [place] Pharmacist Assistant [phone]

## 2020-03-24 ENCOUNTER — Other Ambulatory Visit: Payer: Self-pay

## 2020-03-24 ENCOUNTER — Ambulatory Visit: Payer: Medicare Other

## 2020-03-24 DIAGNOSIS — I10 Essential (primary) hypertension: Secondary | ICD-10-CM

## 2020-03-24 DIAGNOSIS — E785 Hyperlipidemia, unspecified: Secondary | ICD-10-CM

## 2020-03-24 NOTE — Patient Instructions (Addendum)
Visit Information It was great speaking with you today!  Please let me know if you have any questions about our visit.  Goals Addressed            This Visit's Progress   . Chronic Care Management       CARE PLAN ENTRY (see longitudinal plan of care for additional care plan information)  Current Barriers:  . Chronic Disease Management support, education, and care coordination needs related to Hypertension, Hyperlipidemia, Atrial Fibrillation, Hypothyroidism, Osteopenia, Osteoarthritis and Mild Cognitive Impairment    Hypertension BP Readings from Last 3 Encounters:  01/09/20 140/70  12/26/19 130/70  11/05/19 (!) 154/78   . Pharmacist Clinical Goal(s): o Over the next 90 days, patient will work with PharmD and providers to maintain BP goal <140/90 . Current regimen:  . Losartan- HCTZ 100-25 mg daily . Metoprolol Succinate 100 mg daily  . Interventions: o Discussed low salt diet and exercising as tolerated extensively o Recommend decreasing metoprolol to 50 mg daily . Patient self care activities - Over the next 90 days, patient will: o Check blood pressure 3-5 times weekly, document, and provide at future appointments o Ensure daily salt intake < 2300 mg/day  Hyperlipidemia Lab Results  Component Value Date/Time   LDLCALC 109 (H) 02/05/2019 08:52 AM   . Pharmacist Clinical Goal(s): o Over the next 90 days, patient will work with PharmD and providers to achieve LDL goal < 100 . Current regimen:  o Fish Oil 1000 mg daily . Interventions: o Discussed low cholesterol diet and exercising as tolerated extensively o Recommend rechecking cholesterol levels to know if we should continue fish oil  Medication management . Pharmacist Clinical Goal(s): o Over the next 90 days, patient will work with PharmD and providers to maintain optimal medication adherence . [facility] . Interventions o Comprehensive medication review performed. o Continue current  medication management strategy . Patient self care activities - Over the next 90 days, patient will: o Take medications as prescribed o Report any questions or concerns to PharmD and/or provider(s)       Cynthia Mathis was given information about Chronic Care Management services today including:  1. CCM service includes personalized support from designated clinical staff supervised by her physician, including individualized plan of care and coordination with other care providers 2. 24/7 contact phone numbers for assistance for urgent and routine care needs. 3. Standard insurance, coinsurance, copays and deductibles apply for chronic care management only during months in which we provide at least 20 minutes of these services. Most insurances cover these services at 100%, however patients may be responsible for any copay, coinsurance and/or deductible if applicable. This service may help you avoid the need for more expensive face-to-face services. 4. Only one practitioner may furnish and bill the service in a calendar month. 5. The patient may stop CCM services at any time (effective at the end of the month) by phone call to the office staff.  Patient agreed to services and verbal consent obtained.   Print copy of patient instructions provided.  Face to Face appointment with pharmacist scheduled for: 09/29/20 at 11:00 AM  [facility] [place]    [facility] [place] stands for "Dietary Approaches to Stop Hypertension." The DASH eating plan is a healthy eating plan that has been shown to reduce high blood pressure (hypertension). It may also reduce your risk for type 2 diabetes, heart disease, and stroke. The DASH eating plan  may also help with weight loss. What are tips for following this plan?  General guidelines  Avoid eating more than 2,300 mg (milligrams) of salt (sodium) a day. If you have hypertension, you may need to  reduce your sodium intake to 1,500 mg a day.  Limit alcohol intake to no more than 1 drink a day for nonpregnant women and 2 drinks a day for men. One drink equals 12 oz of beer, 5 oz of wine, or 1 oz of hard liquor.  Work with your health care provider to maintain a healthy body weight or to lose weight. Ask what an ideal weight is for you.  Get at least 30 minutes of exercise that causes your heart to beat faster (aerobic exercise) most days of the week. Activities may include walking, swimming, or biking.  Work with your health care provider or diet and nutrition specialist (dietitian) to adjust your eating plan to your individual calorie needs. Reading food labels   Check food labels for the amount of sodium per serving. Choose foods with less than 5 percent of the Daily Value of sodium. Generally, foods with less than 300 mg of sodium per serving fit into this eating plan.  To find whole grains, look for the word "whole" as the first word in the ingredient list. Shopping  Buy products labeled as "low-sodium" or "no salt added."  Buy fresh foods. Avoid canned foods and premade or frozen meals. Cooking  Avoid adding salt when cooking. Use salt-free seasonings or herbs instead of table salt or sea salt. Check with your health care provider or pharmacist before using salt substitutes.  Do not fry foods. Cook foods using healthy methods such as baking, boiling, grilling, and broiling instead.  Cook with heart-healthy oils, such as olive, canola, soybean, or sunflower oil. Meal planning  Eat a balanced diet that includes: ? 5 or more servings of fruits and vegetables each day. At each meal, try to fill half of your plate with fruits and vegetables. ? Up to 6-8 servings of whole grains each day. ? Less than 6 oz of lean meat, poultry, or fish each day. A 3-oz serving of meat is about the same size as a deck of cards. One egg equals 1 oz. ? 2 servings of low-fat dairy each day. ? A  serving of nuts, seeds, or beans 5 times each week. ? Heart-healthy fats. Healthy fats called Omega-3 fatty acids are found in foods such as flaxseeds and coldwater fish, like sardines, salmon, and mackerel.  Limit how much you eat of the following: ? Canned or prepackaged foods. ? Food that is high in trans fat, such as fried foods. ? Food that is high in saturated fat, such as fatty meat. ? Sweets, desserts, sugary drinks, and other foods with added sugar. ? Full-fat dairy products.  Do not salt foods before eating.  Try to eat at least 2 vegetarian meals each week.  Eat more home-cooked food and less restaurant, buffet, and fast food.  When eating at a restaurant, ask that your food be prepared with less salt or no salt, if possible. What foods are recommended? The items listed may not be a complete list. Talk with your dietitian about what dietary choices are best for you. Grains Whole-grain or whole-wheat bread. Whole-grain or whole-wheat pasta. Brown rice. [place]. Bulgur. Whole-grain and low-sodium cereals. Pita bread. Low-fat, low-sodium crackers. Whole-wheat flour tortillas. Vegetables Fresh or frozen vegetables (raw, steamed, roasted, or grilled). Low-sodium or reduced-sodium tomato  and vegetable juice. Low-sodium or reduced-sodium tomato sauce and tomato paste. Low-sodium or reduced-sodium canned vegetables. Fruits All fresh, dried, or frozen fruit. Canned fruit in natural juice (without added sugar). Meat and other protein foods Skinless chicken or [place]. Ground chicken or [place]. Pork with fat trimmed off. Fish and seafood. Egg whites. Dried beans, peas, or lentils. Unsalted nuts, nut butters, and seeds. Unsalted canned beans. Lean cuts of beef with fat trimmed off. Low-sodium, lean deli meat. Dairy Low-fat (1%) or fat-free (skim) milk. Fat-free, low-fat, or reduced-fat cheeses. Nonfat, low-sodium ricotta or cottage cheese. Low-fat or nonfat yogurt. Low-fat,  low-sodium cheese. Fats and oils Soft margarine without trans fats. Vegetable oil. Low-fat, reduced-fat, or light mayonnaise and salad dressings (reduced-sodium). Canola, safflower, olive, soybean, and sunflower oils. Avocado. Seasoning and other foods Herbs. Spices. Seasoning mixes without salt. Unsalted popcorn and pretzels. Fat-free sweets. What foods are not recommended? The items listed may not be a complete list. Talk with your dietitian about what dietary choices are best for you. Grains Baked goods made with fat, such as croissants, muffins, or some breads. Dry pasta or rice meal packs. Vegetables Creamed or fried vegetables. Vegetables in a cheese sauce. Regular canned vegetables (not low-sodium or reduced-sodium). Regular canned tomato sauce and paste (not low-sodium or reduced-sodium). Regular tomato and vegetable juice (not low-sodium or reduced-sodium). Angie Fava. Olives. Fruits Canned fruit in a light or heavy syrup. Fried fruit. Fruit in cream or butter sauce. Meat and other protein foods Fatty cuts of meat. Ribs. Fried meat. Berniece Salines. Sausage. Bologna and other processed lunch meats. Salami. Fatback. Hotdogs. Bratwurst. Salted nuts and seeds. Canned beans with added salt. Canned or smoked fish. Whole eggs or egg yolks. Chicken or [place] with skin. Dairy Whole or 2% milk, cream, and half-and-half. Whole or full-fat cream cheese. Whole-fat or sweetened yogurt. Full-fat cheese. Nondairy creamers. Whipped toppings. Processed cheese and cheese spreads. Fats and oils Butter. Stick margarine. Lard. Shortening. Ghee. Bacon fat. Tropical oils, such as coconut, palm kernel, or palm oil. Seasoning and other foods Salted popcorn and pretzels. Onion salt, garlic salt, seasoned salt, table salt, and sea salt. Worcestershire sauce. Tartar sauce. Barbecue sauce. Teriyaki sauce. Soy sauce, including reduced-sodium. Steak sauce. Canned and packaged gravies. Fish sauce. Oyster sauce. Cocktail sauce.  Horseradish that you find on the shelf. Ketchup. Mustard. Meat flavorings and tenderizers. Bouillon cubes. Hot sauce and Tabasco sauce. Premade or packaged marinades. Premade or packaged taco seasonings. Relishes. Regular salad dressings. Where to find more information:  National Heart, Lung, and [place]: https://wilson-eaton.com/  American Heart Association: www.heart.org Summary  The DASH eating plan is a healthy eating plan that has been shown to reduce high blood pressure (hypertension). It may also reduce your risk for type 2 diabetes, heart disease, and stroke.  With the DASH eating plan, you should limit salt (sodium) intake to 2,300 mg a day. If you have hypertension, you may need to reduce your sodium intake to 1,500 mg a day.  When on the DASH eating plan, aim to eat more fresh fruits and vegetables, whole grains, lean proteins, low-fat dairy, and heart-healthy fats.  Work with your health care provider or diet and nutrition specialist (dietitian) to adjust your eating plan to your individual calorie needs. This information is not intended to replace advice given to you by your health care provider. Make sure you discuss any questions you have with your health care provider. Document Revised: 06/08/2017 Document Reviewed: 06/19/2016 Elsevier Patient Education  2020 [employer].

## 2020-03-26 ENCOUNTER — Ambulatory Visit: Payer: Medicare Other | Admitting: Pharmacist

## 2020-03-26 ENCOUNTER — Other Ambulatory Visit: Payer: Self-pay

## 2020-03-26 DIAGNOSIS — I480 Paroxysmal atrial fibrillation: Secondary | ICD-10-CM

## 2020-03-26 DIAGNOSIS — Z5181 Encounter for therapeutic drug level monitoring: Secondary | ICD-10-CM

## 2020-03-26 LAB — POCT INR: INR: 5.4 — AB (ref 2.0–3.0)

## 2020-03-26 NOTE — Patient Instructions (Signed)
HOLD your warfarin dose today, tomorrow, and Sunday. Then start taking 1/2 tablet (2.5 mg) daily. Keep eating a consistent amount of greens.  Recheck INR in 1 week. Call us with any medication changes or if scheduled for any procedures [phone].

## 2020-03-26 NOTE — Progress Notes (Signed)
Left message on machine per DPR- that there will be more weeks on calendar than normal. Pt should still come 9/24 for apt

## 2020-03-29 ENCOUNTER — Other Ambulatory Visit: Payer: Self-pay | Admitting: Family Medicine

## 2020-04-02 ENCOUNTER — Ambulatory Visit: Payer: Medicare Other | Admitting: *Deleted

## 2020-04-02 ENCOUNTER — Other Ambulatory Visit: Payer: Self-pay

## 2020-04-02 DIAGNOSIS — I480 Paroxysmal atrial fibrillation: Secondary | ICD-10-CM | POA: Diagnosis not present

## 2020-04-02 DIAGNOSIS — Z5181 Encounter for therapeutic drug level monitoring: Secondary | ICD-10-CM

## 2020-04-02 LAB — POCT INR: INR: 1.6 — AB (ref 2.0–3.0)

## 2020-04-02 NOTE — Patient Instructions (Signed)
Description   Take 1 tablet today and then continue to take 1/2 a tablet daily. Recheck INR in 10 days. Call us with any medication changes or if scheduled for any procedures [phone].

## 2020-04-06 ENCOUNTER — Other Ambulatory Visit: Payer: Self-pay | Admitting: Cardiology

## 2020-04-06 DIAGNOSIS — I480 Paroxysmal atrial fibrillation: Secondary | ICD-10-CM

## 2020-04-06 MED ORDER — WARFARIN SODIUM 5 MG PO TABS: ORAL_TABLET | ORAL | 1 refills | Status: DC

## 2020-04-06 NOTE — Telephone Encounter (Signed)
*  STAT* If patient is at the pharmacy, call can be transferred to refill team.   1. Which medications need to be refilled? (please list name of each medication and dose if known) warfarin (COUMADIN) 5 MG tablet  2. Which pharmacy/location (including street and city if local pharmacy) is medication to be sent to? [facility]  3. Do they need a 30 day or 90 day supply? 90 day

## 2020-04-07 ENCOUNTER — Other Ambulatory Visit: Payer: Self-pay

## 2020-04-07 DIAGNOSIS — C50411 Malignant neoplasm of upper-outer quadrant of right female breast: Secondary | ICD-10-CM

## 2020-04-07 MED ORDER — ANASTROZOLE 1 MG PO TABS: 1.0000 mg | ORAL_TABLET | Freq: Every day | ORAL | 2 refills | Status: DC

## 2020-04-07 NOTE — Telephone Encounter (Signed)
Spoke with Gwinda Passe at [facility] today requesting Rx for Anastrozole. This nurse called pt's son, [name] to discuss if pt is still taking this medication. [name] states to his knowledge, his mother is taking this medication. Rx sent to [facility].

## 2020-04-12 ENCOUNTER — Ambulatory Visit (INDEPENDENT_AMBULATORY_CARE_PROVIDER_SITE_OTHER): Payer: Medicare Other | Admitting: Ophthalmology

## 2020-04-12 ENCOUNTER — Encounter (INDEPENDENT_AMBULATORY_CARE_PROVIDER_SITE_OTHER): Payer: Self-pay | Admitting: Ophthalmology

## 2020-04-12 ENCOUNTER — Other Ambulatory Visit: Payer: Self-pay

## 2020-04-12 DIAGNOSIS — H353211 Exudative age-related macular degeneration, right eye, with active choroidal neovascularization: Secondary | ICD-10-CM

## 2020-04-12 DIAGNOSIS — H353221 Exudative age-related macular degeneration, left eye, with active choroidal neovascularization: Secondary | ICD-10-CM | POA: Diagnosis not present

## 2020-04-12 MED ORDER — BEVACIZUMAB CHEMO INJECTION 1.25MG/0.05ML SYRINGE FOR KALEIDOSCOPE
1.2500 mg | INTRAVITREAL | Status: AC | PRN
  Administered 2020-04-12: 1.25 mg via INTRAVITREAL

## 2020-04-12 NOTE — Assessment & Plan Note (Signed)
Follow-up OS as scheduled 

## 2020-04-12 NOTE — Assessment & Plan Note (Signed)
OD currently improving and stable at 10-week follow-up interval post Avastin.  Outer retinal atrophy, drusen deposits, limits acuity yet with excellent visual acuity.  Old nonactive CME temporally noted.  Repeat injection Avastin OD today and examination OD in 3 months

## 2020-04-12 NOTE — Progress Notes (Signed)
04/12/2020     CHIEF COMPLAINT Patient presents for Retina Follow Up   HISTORY OF PRESENT ILLNESS: Cynthia Mathis is a 86 y.o. female who presents to the clinic today for:   HPI    Retina Follow Up    Patient presents with  Wet AMD.  In right eye.  Severity is moderate.  Duration of 10 weeks.  Since onset it is stable.          Comments    10 Week Wet AMD f\u OD. Possible Avastin OD. OCT  Pt states no changes in vision. Denies any complaints.       Last edited by Tilda Franco on 04/12/2020  9:59 AM. (History)      Referring physician: [name], MD [place],  [place] [address]  HISTORICAL INFORMATION:   Selected notes from the MEDICAL RECORD NUMBER    Lab Results  Component Value Date   HGBA1C 6.6 (H) 02/12/2019     CURRENT MEDICATIONS: No current outpatient medications on file. (Ophthalmic Drugs)   No current facility-administered medications for this visit. (Ophthalmic Drugs)   Current Outpatient Medications (Other)  Medication Sig  . anastrozole (ARIMIDEX) 1 MG tablet Take 1 tablet (1 mg total) by mouth daily.  . Calcium Carbonate-Vitamin D (CALCIUM 600+D) 600-400 MG-UNIT per tablet Take 1 tablet by mouth daily.  . Cyanocobalamin (VITAMIN B 12) 500 MCG TABS Take 1,000 mcg by mouth daily.   [name] donepezil (ARICEPT) 10 MG tablet TAKE ONE TABLET BY MOUTH EVERY NIGHT AT BEDTIME  . fish oil-omega-3 fatty acids 1000 MG capsule Take 1 g by mouth daily.  . fluorouracil (EFUDEX) 5 % cream Apply topically daily. Apply QHS x 3-4 weeks expect irritation.  [name] GLUCOSAMINE-CHONDROITIN PO Take 1 tablet by mouth 3 (three) times daily. 1500-1200  . losartan-hydrochlorothiazide (HYZAAR) 100-25 MG tablet Take 1 tablet by mouth daily.  . memantine (NAMENDA) 10 MG tablet TAKE ONE TABLET BY MOUTH TWICE A DAY  . metoprolol succinate (TOPROL-XL) 100 MG 24 hr tablet Take 1 tablet (100 mg total) by mouth daily. Take with or immediately following a meal.  .  Multiple Vitamins-Minerals (MULTIVITAMIN & MINERAL PO) Take 1 tablet by mouth daily.  [name] warfarin (COUMADIN) 5 MG tablet Take as directed by [facility]   No current facility-administered medications for this visit. (Other)      REVIEW OF SYSTEMS:    ALLERGIES Allergies  Allergen Reactions  . Epinephrine Palpitations    heart racing    PAST MEDICAL HISTORY Past Medical History:  Diagnosis Date  . Anxiety   . Borderline diabetes mellitus   . Cancer ([place]) 2020   breast  . Diverticulosis of colon   . DJD (degenerative joint disease)   . Dyslipidemia   . History of hematuria   . History of osteoporosis   . Hx of colonic polyps   . Hypertension   . Hypothyroid   . Macular degeneration    sees [name] for general eye care, sees [name] for retinal injections   . Memory loss   . Osteoporosis    took bisphosphonates, now off. Last DEXA in 2015 showed osteopenia   . Paroxysmal atrial fibrillation (HCC)    sees [name]   . SCCA (squamous cell carcinoma) of skin 12/11/2019   Left Hand Posterior (in situ)   Past Surgical History:  Procedure Laterality Date  . APPENDECTOMY  1947  . BREAST LUMPECTOMY WITH RADIOACTIVE SEED LOCALIZATION  Right 05/14/2019   Procedure: RIGHT BREAST LUMPECTOMY WITH RADIOACTIVE SEED LOCALIZATION;  Surgeon: [name], MD;  Location: [facility];  Service: General;  Laterality: Right;  . CATARACT EXTRACTION W/PHACO Bilateral   . COLONOSCOPY  12/02/2014   per [name], severe diverticulosis but no polyps   . MASS EXCISION  04/17/2012   Procedure: EXCISION MASS;  Surgeon: [name], MD;  Location: [facility];  Service: General;  Laterality: N/A;  . THYROIDECTOMY  1990   [name]    FAMILY HISTORY Family History  Problem Relation Age of Onset  . Stroke Mother   . Colon cancer Neg Hx   . Stomach cancer Neg Hx     SOCIAL HISTORY Social History   Tobacco Use  . Smoking status: Former Smoker     Quit date: 07/10/1965    Years since quitting: 54.7  . Smokeless tobacco: Never Used  . Tobacco comment: 3 cigs per week x 3 years (started in 1964), 12/27/15 does not smoke  Vaping Use  . Vaping Use: Never used  Substance Use Topics  . Alcohol use: Yes    Alcohol/week: 3.0 standard drinks    Types: 3 Glasses of wine per week    Comment: occasionally  . Drug use: No         OPHTHALMIC EXAM: Base Eye Exam    Visual Acuity (Snellen - Linear)      Right Left   Dist [place] 20/30 -1 20/25       Tonometry (Tonopen, 10:03 AM)      Right Left   Pressure 13 15       Pupils      Pupils Dark Light Shape React APD   Right PERRL 2 2 Round Minimal None   Left PERRL 2 2 Round Minimal None       Visual Fields (Counting fingers)      Left Right    Full Full       Neuro/Psych    Oriented x3: Yes   Mood/Affect: Normal       Dilation    Right eye: 1.0% Mydriacyl, 2.5% Phenylephrine @ 10:03 AM        Slit Lamp and Fundus Exam    External Exam      Right Left   External Normal Normal       Slit Lamp Exam      Right Left   Lids/Lashes Normal Normal   Conjunctiva/Sclera White and quiet White and quiet   Cornea Clear Clear   Anterior Chamber Deep and quiet Deep and quiet   Iris Round and reactive Round and reactive   Lens Posterior chamber intraocular lens Posterior chamber intraocular lens   Anterior Vitreous Normal Normal       Fundus Exam      Right Left   Posterior Vitreous Posterior vitreous detachment    Disc Normal    C/D Ratio 0.3    Macula Atrophy, Geographic atrophy, no macular thickening, no hemorrhage, no exudates    Vessels Normal    Periphery Normal           IMAGING AND PROCEDURES  Imaging and Procedures for 04/12/20  OCT, Retina - OU - Both Eyes       Right Eye Quality was good. Scan locations included subfoveal. Central Foveal Thickness: 207. Findings include no IRF, abnormal foveal contour, no SRF, subretinal scarring.   Left Eye Quality  was good. Scan locations included subfoveal. Central Foveal  Thickness: 235. Findings include inner retinal atrophy, abnormal foveal contour, no SRF, subretinal scarring.        Intravitreal Injection, Pharmacologic Agent - OD - Right Eye       Time Out 04/12/2020. 11:10 AM. Confirmed correct patient, procedure, site, and patient consented.   Anesthesia Topical anesthesia was used. Anesthetic medications included Akten 3.5%.   Procedure Preparation included 5% betadine to ocular surface, 10% betadine to eyelids, Tobramycin 0.3%, Ofloxacin . A 30 gauge needle was used.   Injection:  1.25 mg Bevacizumab (AVASTIN) SOLN   NDC: 70360-001-02, Lot: [id number]   Route: Intravitreal, Site: Right Eye, Waste: 0 mg  Post-op Post injection exam found visual acuity of at least counting fingers. The patient tolerated the procedure well. There were no complications. The patient received written and verbal post procedure care education. Post injection medications were not given.                 ASSESSMENT/PLAN:  Exudative age-related macular degeneration of right eye with active choroidal neovascularization (HCC) OD currently improving and stable at 10-week follow-up interval post Avastin.  Outer retinal atrophy, drusen deposits, limits acuity yet with excellent visual acuity.  Old nonactive CME temporally noted.  Repeat injection Avastin OD today and examination OD in 3 months  Exudative age-related macular degeneration of left eye with active choroidal neovascularization ([place]) Follow-up OS as scheduled      ICD-10-CM   1. Exudative age-related macular degeneration of right eye with active choroidal neovascularization (HCC)  H35.3211 OCT, Retina - OU - Both Eyes    Intravitreal Injection, Pharmacologic Agent - OD - Right Eye    Bevacizumab (AVASTIN) SOLN 1.25 mg  2. Exudative age-related macular degeneration of left eye with active choroidal neovascularization ([place])  H35.3221      1.  2.  3.  Ophthalmic Meds Ordered this visit:  Meds ordered this encounter  Medications  . Bevacizumab (AVASTIN) SOLN 1.25 mg       Return in about 3 months (around 07/13/2020) for dilate, OD, AVASTIN OCT.  There are no Patient Instructions on file for this visit.   Explained the diagnoses, plan, and follow up with the patient and they expressed understanding.  Patient expressed understanding of the importance of proper follow up care.   Clent Demark Clarrisa Kaylor M.D. Diseases & Surgery of the Retina and Vitreous [facility] 04/12/20     Abbreviations: M myopia (nearsighted); A astigmatism; H hyperopia (farsighted); P presbyopia; Mrx spectacle prescription;  CTL contact lenses; OD right eye; OS left eye; OU both eyes  XT exotropia; ET esotropia; PEK punctate epithelial keratitis; PEE punctate epithelial erosions; DES dry eye syndrome; MGD meibomian gland dysfunction; ATs artificial tears; PFAT's preservative free artificial tears; [place] nuclear sclerotic cataract; PSC posterior subcapsular cataract; ERM epi-retinal membrane; PVD posterior vitreous detachment; RD retinal detachment; DM diabetes mellitus; DR diabetic retinopathy; NPDR non-proliferative diabetic retinopathy; PDR proliferative diabetic retinopathy; CSME clinically significant macular edema; DME diabetic macular edema; dbh dot blot hemorrhages; CWS cotton wool spot; POAG primary open angle glaucoma; C/D cup-to-disc ratio; HVF humphrey visual field; GVF goldmann visual field; OCT optical coherence tomography; IOP intraocular pressure; BRVO Branch retinal vein occlusion; CRVO central retinal vein occlusion; CRAO central retinal artery occlusion; BRAO branch retinal artery occlusion; RT retinal tear; SB scleral buckle; PPV pars plana vitrectomy; VH Vitreous hemorrhage; PRP panretinal laser photocoagulation; IVK intravitreal kenalog; VMT vitreomacular traction; MH Macular hole;  NVD neovascularization of the disc; NVE  neovascularization elsewhere; AREDS  age related eye disease study; ARMD age related macular degeneration; POAG primary open angle glaucoma; EBMD epithelial/anterior basement membrane dystrophy; ACIOL anterior chamber intraocular lens; IOL intraocular lens; PCIOL posterior chamber intraocular lens; Phaco/IOL phacoemulsification with intraocular lens placement; [place] photorefractive keratectomy; LASIK laser assisted in situ keratomileusis; HTN hypertension; DM diabetes mellitus; COPD chronic obstructive pulmonary disease

## 2020-04-13 ENCOUNTER — Ambulatory Visit (INDEPENDENT_AMBULATORY_CARE_PROVIDER_SITE_OTHER): Payer: Medicare Other | Admitting: *Deleted

## 2020-04-13 DIAGNOSIS — I480 Paroxysmal atrial fibrillation: Secondary | ICD-10-CM | POA: Diagnosis not present

## 2020-04-13 DIAGNOSIS — Z5181 Encounter for therapeutic drug level monitoring: Secondary | ICD-10-CM

## 2020-04-13 LAB — POCT INR: INR: 2.4 (ref 2.0–3.0)

## 2020-04-13 NOTE — Patient Instructions (Signed)
Description   Continue taking Warfarin 1/2 tablet daily. Recheck INR in 2 weeks. Call us with any medication changes or if scheduled for any procedures [phone].

## 2020-04-19 ENCOUNTER — Emergency Department (HOSPITAL_COMMUNITY)
Admission: EM | Admit: 2020-04-19 | Discharge: 2020-04-20 | Disposition: A | Discharge status: Home / Self Care | Payer: Medicare Other | Attending: Emergency Medicine | Admitting: Emergency Medicine

## 2020-04-19 ENCOUNTER — Emergency Department (HOSPITAL_COMMUNITY): Payer: Medicare Other

## 2020-04-19 ENCOUNTER — Encounter (HOSPITAL_COMMUNITY): Payer: Self-pay | Admitting: Emergency Medicine

## 2020-04-19 DIAGNOSIS — Z87891 Personal history of nicotine dependence: Secondary | ICD-10-CM | POA: Diagnosis not present

## 2020-04-19 DIAGNOSIS — I10 Essential (primary) hypertension: Secondary | ICD-10-CM | POA: Diagnosis not present

## 2020-04-19 DIAGNOSIS — E039 Hypothyroidism, unspecified: Secondary | ICD-10-CM | POA: Diagnosis not present

## 2020-04-19 DIAGNOSIS — Z79899 Other long term (current) drug therapy: Secondary | ICD-10-CM | POA: Insufficient documentation

## 2020-04-19 DIAGNOSIS — Z170 Estrogen receptor positive status [ER+]: Secondary | ICD-10-CM | POA: Insufficient documentation

## 2020-04-19 DIAGNOSIS — R002 Palpitations: Secondary | ICD-10-CM | POA: Diagnosis not present

## 2020-04-19 DIAGNOSIS — E119 Type 2 diabetes mellitus without complications: Secondary | ICD-10-CM | POA: Diagnosis not present

## 2020-04-19 DIAGNOSIS — Z7901 Long term (current) use of anticoagulants: Secondary | ICD-10-CM | POA: Diagnosis not present

## 2020-04-19 DIAGNOSIS — I480 Paroxysmal atrial fibrillation: Secondary | ICD-10-CM

## 2020-04-19 DIAGNOSIS — Z853 Personal history of malignant neoplasm of breast: Secondary | ICD-10-CM | POA: Insufficient documentation

## 2020-04-19 DIAGNOSIS — I4891 Unspecified atrial fibrillation: Secondary | ICD-10-CM | POA: Diagnosis not present

## 2020-04-19 LAB — BASIC METABOLIC PANEL
Anion gap: 13 (ref 5–15)
BUN: 20 mg/dL (ref 8–23)
CO2: 23 mmol/L (ref 22–32)
Calcium: 10 mg/dL (ref 8.9–10.3)
Chloride: 102 mmol/L (ref 98–111)
Creatinine, Ser: 0.91 mg/dL (ref 0.44–1.00)
GFR, Estimated: 58 mL/min — ABNORMAL LOW (ref >60)
Glucose, Bld: 220 mg/dL — ABNORMAL HIGH (ref 70–99)
Potassium: 3.6 mmol/L (ref 3.5–5.1)
Sodium: 138 mmol/L (ref 135–145)

## 2020-04-19 LAB — CBC
HCT: 43.2 % (ref 36.0–46.0)
Hemoglobin: 14.3 g/dL (ref 12.0–15.0)
MCH: 30.8 pg (ref 26.0–34.0)
MCHC: 33.1 g/dL (ref 30.0–36.0)
MCV: 93.1 fL (ref 80.0–100.0)
Platelets: 248 10*3/uL (ref 150–400)
RBC: 4.64 MIL/uL (ref 3.87–5.11)
RDW: 13 % (ref 11.5–15.5)
WBC: 12.7 10*3/uL — ABNORMAL HIGH (ref 4.0–10.5)
nRBC: 0 % (ref 0.0–0.2)

## 2020-04-19 NOTE — ED Notes (Signed)
Please call son with updates Marisa Severin [phone].

## 2020-04-19 NOTE — ED Triage Notes (Signed)
Pt transported from home by EMS for c/o irregular HR, pt states she felt heart race while resting in bed, pt given Diltiazem 10mg  IV by EMS. Initial HR 180-190, down to 60-120 after meds. BP 150-90, 18, 94%. 22 R AC. Pt denies complaints at this time. Hx of afib

## 2020-04-20 ENCOUNTER — Other Ambulatory Visit: Payer: Self-pay

## 2020-04-20 NOTE — ED Provider Notes (Signed)
[facility] EMERGENCY DEPARTMENT Provider Note   CSN: [id number] Arrival date & time: 04/19/20  2048   History Chief Complaint  Patient presents with  . Atrial Fibrillation    Cynthia Mathis is a 86 y.o. female.  The history is provided by the patient.  Atrial Fibrillation  She has history of hypertension, diabetes, hyperlipidemia, paroxysmal atrial fibrillation anticoagulated with warfarin and comes in because of palpitations.  She apparently had felt her heart racing and called for an ambulance.  EMS noted atrial fibrillation with rapid ventricular response and gave a dose of diltiazem with improvement in heart rate she denies chest pain, heaviness, tightness, pressure.  She denies any dyspnea, nausea, vomiting, diaphoresis.  Past Medical History:  Diagnosis Date  . Anxiety   . Borderline diabetes mellitus   . Cancer ([place]) 2020   breast  . Diverticulosis of colon   . DJD (degenerative joint disease)   . Dyslipidemia   . History of hematuria   . History of osteoporosis   . Hx of colonic polyps   . Hypertension   . Hypothyroid   . Macular degeneration    sees [name] for general eye care, sees [name] for retinal injections   . Memory loss   . Osteoporosis    took bisphosphonates, now off. Last DEXA in 2015 showed osteopenia   . Paroxysmal atrial fibrillation (HCC)    sees [name]   . SCCA (squamous cell carcinoma) of skin 12/11/2019   Left Hand Posterior (in situ)    Patient Active Problem List   Diagnosis Date Noted  . Posterior vitreous detachment of right eye 12/02/2019  . Exudative age-related macular degeneration of left eye with active choroidal neovascularization ([place]) 10/28/2019  . Exudative age-related macular degeneration of right eye with active choroidal neovascularization ([place]) 10/28/2019  . Retinal hemorrhage of left eye 10/28/2019  . Malignant neoplasm of upper-outer quadrant of right breast in female, estrogen  receptor positive ([place]) 03/26/2019  . Mild cognitive impairment 03/28/2018  . Foot pain, right 12/05/2017  . Hx of colonic polyps   . Diverticulosis of colon   . Anxiety   . PAF (paroxysmal atrial fibrillation) ([place]) 08/24/2016  . Low back pain 05/06/2015  . Osteopenia 08/04/2013  . Memory loss 09/17/2012  . 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 Macular degeneration (senile) of retina 08/12/2008  . Osteoarthritis 08/12/2008  . Impaired fasting glucose 02/10/2008  . Hypothyroidism 08/08/2007  . Dyslipidemia 08/08/2007  . Essential hypertension 08/08/2007    Past Surgical History:  Procedure Laterality Date  . APPENDECTOMY  1947  . BREAST LUMPECTOMY WITH RADIOACTIVE SEED LOCALIZATION Right 05/14/2019   Procedure: RIGHT BREAST LUMPECTOMY WITH RADIOACTIVE SEED LOCALIZATION;  Surgeon: [name], MD;  Location: [facility];  Service: General;  Laterality: Right;  . CATARACT EXTRACTION W/PHACO Bilateral   . COLONOSCOPY  12/02/2014   per [name], severe diverticulosis but no polyps   . MASS EXCISION  04/17/2012   Procedure: EXCISION MASS;  Surgeon: [name], MD;  Location: [facility];  Service: General;  Laterality: N/A;  . THYROIDECTOMY  1990   [name]     OB History   No obstetric history on file.     Family History  Problem Relation Age of Onset  . Stroke Mother   . Colon cancer Neg Hx   . Stomach cancer Neg Hx     Social History   Tobacco Use  . Smoking status: Former Smoker  Quit date: 07/10/1965    Years since quitting: 54.8  . Smokeless tobacco: Never Used  . Tobacco comment: 3 cigs per week x 3 years (started in 1964), 12/27/15 does not smoke  Vaping Use  . Vaping Use: Never used  Substance Use Topics  . Alcohol use: Yes    Alcohol/week: 3.0 standard drinks    Types: 3 Glasses of wine per week    Comment: occasionally  . Drug use: No    Home Medications Prior to Admission medications   Medication Sig Start Date End Date  Taking? Authorizing Provider  anastrozole (ARIMIDEX) 1 MG tablet Take 1 tablet (1 mg total) by mouth daily. 04/07/20   [name], [name], MD  Calcium Carbonate-Vitamin D (CALCIUM 600+D) 600-400 MG-UNIT per tablet Take 1 tablet by mouth daily.    [provider]  Cyanocobalamin (VITAMIN B 12) 500 MCG TABS Take 1,000 mcg by mouth daily.     [provider]  donepezil (ARICEPT) 10 MG tablet TAKE ONE TABLET BY MOUTH EVERY NIGHT AT BEDTIME 01/30/20   [name], MD  fish oil-omega-3 fatty acids 1000 MG capsule Take 1 g by mouth daily.    [provider]  fluorouracil (EFUDEX) 5 % cream Apply topically daily. Apply QHS x 3-4 weeks expect irritation. 03/11/20   [name], [name], PA-C  GLUCOSAMINE-CHONDROITIN PO Take 1 tablet by mouth 3 (three) times daily. 1500-1200    [provider]  losartan-hydrochlorothiazide (HYZAAR) 100-25 MG tablet Take 1 tablet by mouth daily. 10/17/19   [name], MD  memantine (NAMENDA) 10 MG tablet TAKE ONE TABLET BY MOUTH TWICE A DAY 03/29/20   [name], MD  metoprolol succinate (TOPROL-XL) 100 MG 24 hr tablet Take 1 tablet (100 mg total) by mouth daily. Take with or immediately following a meal. 12/12/19   [name], MD  Multiple Vitamins-Minerals (MULTIVITAMIN & MINERAL PO) Take 1 tablet by mouth daily.    [provider]  warfarin (COUMADIN) 5 MG tablet Take as directed by [facility] 04/06/20   [name], MD    Allergies    Epinephrine  Review of Systems   Review of Systems  All other systems reviewed and are negative.   Physical Exam Updated Vital Signs BP (!) 151/66 (BP Location: Left Arm)   Pulse 64   Temp 98 F (36.7 C) (Oral)   Resp 16   Ht 5\' 2"  (1.575 m)   Wt 64 kg   SpO2 98%   BMI 25.81 kg/m   Physical Exam Vitals and nursing note reviewed.   86 year old female, resting comfortably and in no acute distress. Vital signs are significant for elevated blood pressure.  Oxygen saturation is 96%, which is normal. Head is normocephalic and atraumatic. PERRLA, EOMI. Oropharynx is clear. Neck is nontender and supple without adenopathy or JVD. Back is nontender and there is no CVA tenderness. Lungs are clear without rales, wheezes, or rhonchi. Chest is nontender. Heart has regular rate and rhythm without murmur. Abdomen is soft, flat, nontender without masses or hepatosplenomegaly and peristalsis is normoactive. Extremities have trace edema, full range of motion is present. Skin is warm and dry without rash. Neurologic: Mental status is normal, cranial nerves are intact, there are no motor or sensory deficits.  ED Results / Procedures / Treatments   Labs (all labs ordered are listed, but only abnormal results are displayed) Labs Reviewed  BASIC METABOLIC PANEL - Abnormal; Notable for the following components:  Result Value   Glucose, Bld 220 (*)    GFR, Estimated 58 (*)    All other components within normal limits  CBC - Abnormal; Notable for the following components:   WBC 12.7 (*)    All other components within normal limits    EKG EKG Interpretation  Date/Time:  Monday April 19 2020 21:10:32 EDT Ventricular Rate:  110 PR Interval:    QRS Duration: 74 QT Interval:  358 QTC Calculation: 484 R Axis:   49 Text Interpretation: Atrial fibrillation with rapid ventricular response Nonspecific ST and T wave abnormality Abnormal ECG When compared with ECG of 06/29/2016, Atrial fibrillation has replaced Sinus rhythm Nonspecific ST and T wave abnormality is now present Confirmed by Delora Fuel ([id number]) on 04/20/2020 12:56:22 AM   EKG Interpretation  Date/Time:  Tuesday April 20 2020 05:48:25 EDT Ventricular Rate:  45 PR Interval:    QRS Duration: 87 QT Interval:  556 QTC Calculation: 482 R Axis:   49 Text Interpretation: Sinus bradycardia Low voltage, precordial leads Borderline T abnormalities, diffuse leads When compared with ECG of  04/19/2020, Sinus bradycardia has replaced Atrial fibrillation Confirmed by Delora Fuel ([id number]) on 04/20/2020 5:51:23 AM       Radiology DG Chest 2 View  Result Date: 04/19/2020 CLINICAL DATA:  86 year old female with atrial fibrillation, palpitations. EXAM: CHEST - 2 VIEW COMPARISON:  Chest radiographs 06/29/2016 and earlier. FINDINGS: Lung volumes are stable and within normal limits. Heart size is stable since 2017 and within normal limits. Other mediastinal contours are within normal limits. Visualized tracheal air column is within normal limits. No pneumothorax, pulmonary edema, pleural effusion or confluent pulmonary opacity. Right chest wall surgical clips. Thoracic and lumbar endplate degeneration. No acute osseous abnormality identified. Negative visible bowel gas pattern. Abdominal Calcified aortic atherosclerosis. IMPRESSION: 1. No acute cardiopulmonary abnormality. 2.  Aortic Atherosclerosis (ICD10-I70.0). Electronically Signed   By: Genevie Ann M.D.   On: 04/19/2020 21:28    Procedures Procedures   Medications Ordered in ED Medications - No data to display  ED Course  I have reviewed the triage vital signs and the nursing notes.  Pertinent labs & imaging results that were available during my care of the patient were reviewed by me and considered in my medical decision making (see chart for details).  MDM Rules/Calculators/A&P Paroxysmal atrial fibrillation.  ECG does show atrial fibrillation.  Chest x-ray shows no acute process.  Labs are significant for mildly elevated glucose.  When patient was brought back from the waiting room and placed on a cardiac monitor, it was noted that she had spontaneously converted to sinus rhythm.  Repeat ECG shows sinus bradycardia.  She is discharged with instructions to follow-up with the atrial fibrillation clinic.  Final Clinical Impression(s) / ED Diagnoses Final diagnoses:  Paroxysmal atrial fibrillation ([place])  Anticoagulated on warfarin     Rx / DC Orders ED Discharge Orders    None       Delora Fuel, MD 26/33/35 [phone]

## 2020-04-20 NOTE — Discharge Instructions (Signed)
Return if you are having any problems. 

## 2020-04-22 NOTE — Progress Notes (Signed)
Primary Care Physician: [name], MD Primary Cardiologist: [name] Primary Electrophysiologist: none Referring Physician: [facility]   Cynthia Mathis is a 86 y.o. female with a history of DM, HLD, HTN, and paroxysmal atrial fibrillation who presents for consultation in the [facility].  The patient was initially diagnosed with atrial fibrillation 06/29/16 after presenting to the ED with symptoms of palpitations. Patient is on warfarin for a CHADS2VASC score of 5. Patient had done well and was in her usual state of health until 04/19/20 when she started to feel palpitations with SOB. EMS was called and she was in afib with RVR. She was given diltiazem en route and converted to SR on arrival to the ED. Patient reports this is her first episode of afib in several years. There were no specific triggers that she could identify. She has not had any further palpitations.   Today, she denies symptoms of palpitations, chest pain, shortness of breath, orthopnea, PND, lower extremity edema, dizziness, presyncope, syncope, snoring, daytime somnolence, bleeding, or neurologic sequela. The patient is tolerating medications without difficulties and is otherwise without complaint today.    Atrial Fibrillation Risk Factors:  she does not have symptoms or diagnosis of sleep apnea. she does not have a history of rheumatic fever. she does not have a history of alcohol use.   she has a BMI of Body mass index is 26.78 kg/m.[name] Filed Weights   04/23/20 0903  Weight: 66.4 kg    Family History  Problem Relation Age of Onset  . Stroke Mother   . Colon cancer Neg Hx   . Stomach cancer Neg Hx      Atrial Fibrillation Management history:  Previous antiarrhythmic drugs: none Previous cardioversions: none Previous ablations: none CHADS2VASC score: 5 Anticoagulation history: warfarin   Past Medical History:  Diagnosis Date  . Anxiety   . Borderline diabetes  mellitus   . Cancer ([place]) 2020   breast  . Diverticulosis of colon   . DJD (degenerative joint disease)   . Dyslipidemia   . History of hematuria   . History of osteoporosis   . Hx of colonic polyps   . Hypertension   . Hypothyroid   . Macular degeneration    sees [name] for general eye care, sees [name] for retinal injections   . Memory loss   . Osteoporosis    took bisphosphonates, now off. Last DEXA in 2015 showed osteopenia   . Paroxysmal atrial fibrillation (HCC)    sees [name]   . SCCA (squamous cell carcinoma) of skin 12/11/2019   Left Hand Posterior (in situ)   Past Surgical History:  Procedure Laterality Date  . APPENDECTOMY  1947  . BREAST LUMPECTOMY WITH RADIOACTIVE SEED LOCALIZATION Right 05/14/2019   Procedure: RIGHT BREAST LUMPECTOMY WITH RADIOACTIVE SEED LOCALIZATION;  Surgeon: [name], MD;  Location: [facility];  Service: General;  Laterality: Right;  . CATARACT EXTRACTION W/PHACO Bilateral   . COLONOSCOPY  12/02/2014   per [name], severe diverticulosis but no polyps   . MASS EXCISION  04/17/2012   Procedure: EXCISION MASS;  Surgeon: [name], MD;  Location: [facility];  Service: General;  Laterality: N/A;  . THYROIDECTOMY  1990   [name]    Current Outpatient Medications  Medication Sig Dispense Refill  . anastrozole (ARIMIDEX) 1 MG tablet Take 1 tablet (1 mg total) by mouth daily. 30 tablet 2  . Calcium Carbonate-Vitamin  D (CALCIUM 600+D) 600-400 MG-UNIT per tablet Take 1 tablet by mouth daily.    . Cyanocobalamin (VITAMIN B 12) 500 MCG TABS Take 1,000 mcg by mouth daily.     [name] donepezil (ARICEPT) 10 MG tablet TAKE ONE TABLET BY MOUTH EVERY NIGHT AT BEDTIME 90 tablet 2  . fish oil-omega-3 fatty acids 1000 MG capsule Take 1 g by mouth daily.    . fluorouracil (EFUDEX) 5 % cream Apply topically daily. Apply QHS x 3-4 weeks expect irritation. 40 g 1  . GLUCOSAMINE-CHONDROITIN PO Take 1 tablet by mouth 3  (three) times daily. 1500-1200    . losartan-hydrochlorothiazide (HYZAAR) 100-25 MG tablet Take 1 tablet by mouth daily. 90 tablet 3  . memantine (NAMENDA) 10 MG tablet TAKE ONE TABLET BY MOUTH TWICE A DAY 180 tablet 3  . metoprolol succinate (TOPROL-XL) 100 MG 24 hr tablet Take 1 tablet (100 mg total) by mouth daily. Take with or immediately following a meal. 90 tablet 3  . Multiple Vitamins-Minerals (MULTIVITAMIN & MINERAL PO) Take 1 tablet by mouth daily.    [name] warfarin (COUMADIN) 5 MG tablet Take as directed by [facility]mad[facility] Clinic 60 tablet 1   No current facility-administered medications for this encounter.    Allergies  Allergen Reactions  . Epinephrine Palpitations    heart racing    Social History   Socioeconomic History  . Marital status: Widowed    Spouse name: Mariane Masters Almanza  . Number of children: 3  . Years of education: [employer]  . Highest education level: Not on file  Occupational History  . Occupation: Education officer, environmental: RETIRED  Tobacco Use  . Smoking status: Former Smoker    Quit date: 07/10/1965    Years since quitting: 54.8  . Smokeless tobacco: Never Used  . Tobacco comment: 3 cigs per week x 3 years (started in 1964), 12/27/15 does not smoke  Vaping Use  . Vaping Use: Never used  Substance and Sexual Activity  . Alcohol use: Yes    Alcohol/week: 3.0 standard drinks    Types: 3 Glasses of wine per week    Comment: occasionally  . Drug use: No  . Sexual activity: Not on file  Other Topics Concern  . Not on file  Social History Narrative   03/26/19 Pt lives at home alone now, widow.   her spouse passed Dec 2019 Mariane Masters Hector, who has been dx'd with mild cognitive impairment vs. mild dementia, former pt of [name]). She does not use caffeine, if so, rarely.   Social Determinants of Health   Financial Resource Strain: Low Risk   . Difficulty of Paying Living Expenses: Not hard at all  Food Insecurity:   . Worried About Charity fundraiser in the  Last Year: Not on file  . Ran Out of Food in the Last Year: Not on file  Transportation Needs: No Transportation Needs  . Lack of Transportation (Medical): No  . Lack of Transportation (Non-Medical): No  Physical Activity:   . Days of Exercise per Week: Not on file  . Minutes of Exercise per Session: Not on file  Stress:   . Feeling of Stress : Not on file  Social Connections:   . Frequency of Communication with Friends and Family: Not on file  . Frequency of Social Gatherings with Friends and Family: Not on file  . Attends Religious Services: Not on file  . Active Member of Clubs or Organizations: Not on file  . Attends  Club or Organization Meetings: Not on file  . Marital Status: Not on file  Intimate Partner Violence:   . Fear of Current or Ex-Partner: Not on file  . Emotionally Abused: Not on file  . Physically Abused: Not on file  . Sexually Abused: Not on file     ROS- All systems are reviewed and negative except as per the HPI above.  Physical Exam: Vitals:   04/23/20 0903  BP: 138/80  Pulse: (!) 46  Weight: 66.4 kg  Height: 5\' 2"  (1.575 m)    GEN- The patient is well appearing elderly female, alert and oriented x 3 today.   Head- normocephalic, atraumatic Eyes-  Sclera clear, conjunctiva pink Ears- hearing intact Oropharynx- clear Neck- supple  Lungs- Clear to ausculation bilaterally, normal work of breathing Heart- Regular rate and rhythm, bradycardia, no murmurs, rubs or gallops  GI- soft, NT, ND, + BS Extremities- no clubbing, cyanosis, or edema MS- no significant deformity or atrophy Skin- no rash or lesion Psych- euthymic mood, full affect Neuro- strength and sensation are intact  Wt Readings from Last 3 Encounters:  04/23/20 66.4 kg  04/19/20 64 kg  01/09/20 64.4 kg    EKG today demonstrates SB HR 46, PR 160, QRS 68, QTc 390  Echo 09/11/16 demonstrated  - Left ventricle: The cavity size was normal. There was moderate  focal basal hypertrophy  of the septum with mild posterior wall  hypertrophy. Systolic function was normal. The estimated ejection  fraction was in the range of 60% to 65%. Wall motion was normal;  there were no regional wall motion abnormalities. Doppler  parameters are consistent with abnormal left ventricular  relaxation (grade 1 diastolic dysfunction). Doppler parameters  are consistent with indeterminate ventricular filling pressure.  - Aortic valve: Transvalvular velocity was within the normal range.  There was no stenosis. There was mild regurgitation.  - Right ventricle: The cavity size was normal. Wall thickness was  normal. Systolic function was normal.  - Atrial septum: No defect or patent foramen ovale was identified  by color flow Doppler.  - Tricuspid valve: There was mild regurgitation.  - Pulmonary arteries: Systolic pressure was within the normal  range. PA peak pressure: 33 mm Hg (S).   Epic records are reviewed at length today  CHA2DS2-VASc Score = 5  The patient's score is based upon: CHF History: 0 HTN History: 1 Diabetes History: 1 Stroke History: 0 Vascular Disease History: 0 Age Score: 2 Gender Score: 1  {This patient does not have a recorded CHADS2-VASc score.   Click here to calculate score.   Then [facility] your note.       :[id number]}    ASSESSMENT AND PLAN: 1. Paroxysmal Atrial Fibrillation (ICD10:  I48.0) The patient's CHA2DS2-VASc score is 5, indicating a 7.2% annual risk of stroke.   General education about afib provided and questions answered. We also discussed her stroke risk and the risks and benefits of anticoagulation. Will start diltiazem 30 mg PRN q 4 hours for heart racing. Continue warfarin  Continue Toprol 100 mg daily Could consider AAD if her afib becomes more persistent.   2. Secondary Hypercoagulable State (ICD10:  D68.69) The patient is at significant risk for stroke/thromboembolism based upon her CHA2DS2-VASc Score of 5.  Continue  Warfarin (Coumadin).   3. HTN Stable, no changes today.  4. Bradycardia Patient is asymptomatic. No changes today.   Follow up with [name] per recall. AF clinic in 6 months.    Adline Peals PA-C  [facility] [address] [place], [place] [address] [phone] 04/23/2020 9:10 AM

## 2020-04-23 ENCOUNTER — Other Ambulatory Visit: Payer: Self-pay

## 2020-04-23 ENCOUNTER — Encounter (HOSPITAL_COMMUNITY): Payer: Self-pay | Admitting: Physician Assistant

## 2020-04-23 ENCOUNTER — Ambulatory Visit (HOSPITAL_COMMUNITY)
Admission: RE | Admit: 2020-04-23 | Discharge: 2020-04-23 | Disposition: A | Discharge status: Home / Self Care | Payer: Medicare Other | Source: Ambulatory Visit | Attending: Physician Assistant | Admitting: Physician Assistant

## 2020-04-23 VITALS — BP 138/80 | HR 46 | Ht 62.0 in | Wt 146.4 lb

## 2020-04-23 DIAGNOSIS — Z87891 Personal history of nicotine dependence: Secondary | ICD-10-CM | POA: Insufficient documentation

## 2020-04-23 DIAGNOSIS — E890 Postprocedural hypothyroidism: Secondary | ICD-10-CM | POA: Diagnosis not present

## 2020-04-23 DIAGNOSIS — F419 Anxiety disorder, unspecified: Secondary | ICD-10-CM | POA: Diagnosis not present

## 2020-04-23 DIAGNOSIS — Z7901 Long term (current) use of anticoagulants: Secondary | ICD-10-CM | POA: Diagnosis not present

## 2020-04-23 DIAGNOSIS — Z8673 Personal history of transient ischemic attack (TIA), and cerebral infarction without residual deficits: Secondary | ICD-10-CM | POA: Insufficient documentation

## 2020-04-23 DIAGNOSIS — R001 Bradycardia, unspecified: Secondary | ICD-10-CM | POA: Diagnosis not present

## 2020-04-23 DIAGNOSIS — E119 Type 2 diabetes mellitus without complications: Secondary | ICD-10-CM | POA: Insufficient documentation

## 2020-04-23 DIAGNOSIS — I10 Essential (primary) hypertension: Secondary | ICD-10-CM | POA: Diagnosis not present

## 2020-04-23 DIAGNOSIS — Z79899 Other long term (current) drug therapy: Secondary | ICD-10-CM | POA: Diagnosis not present

## 2020-04-23 DIAGNOSIS — I480 Paroxysmal atrial fibrillation: Secondary | ICD-10-CM | POA: Diagnosis not present

## 2020-04-23 DIAGNOSIS — Z853 Personal history of malignant neoplasm of breast: Secondary | ICD-10-CM | POA: Insufficient documentation

## 2020-04-23 DIAGNOSIS — D6869 Other thrombophilia: Secondary | ICD-10-CM | POA: Diagnosis not present

## 2020-04-23 DIAGNOSIS — E785 Hyperlipidemia, unspecified: Secondary | ICD-10-CM | POA: Diagnosis not present

## 2020-04-23 DIAGNOSIS — Z888 Allergy status to other drugs, medicaments and biological substances status: Secondary | ICD-10-CM | POA: Insufficient documentation

## 2020-04-23 MED ORDER — DILTIAZEM HCL 30 MG PO TABS: ORAL_TABLET | ORAL | 1 refills | Status: DC

## 2020-04-23 NOTE — Patient Instructions (Signed)
Cardizem 30mg  -- take 1 tablet every 4 hours AS NEEDED for AFib heart rate >100 as long as top blood pressure >100.

## 2020-04-27 ENCOUNTER — Other Ambulatory Visit: Payer: Self-pay

## 2020-04-27 ENCOUNTER — Ambulatory Visit: Payer: Medicare Other | Admitting: *Deleted

## 2020-04-27 DIAGNOSIS — Z5181 Encounter for therapeutic drug level monitoring: Secondary | ICD-10-CM | POA: Diagnosis not present

## 2020-04-27 DIAGNOSIS — I480 Paroxysmal atrial fibrillation: Secondary | ICD-10-CM

## 2020-04-27 LAB — POCT INR: INR: 2.8 (ref 2.0–3.0)

## 2020-04-27 NOTE — Patient Instructions (Signed)
Description   Continue taking Warfarin 1/2 tablet daily. Recheck INR in 3 weeks. Call us with any medication changes or if scheduled for any procedures [phone].

## 2020-04-29 ENCOUNTER — Other Ambulatory Visit: Payer: Self-pay

## 2020-04-29 DIAGNOSIS — Z170 Estrogen receptor positive status [ER+]: Secondary | ICD-10-CM

## 2020-04-29 DIAGNOSIS — C50411 Malignant neoplasm of upper-outer quadrant of right female breast: Secondary | ICD-10-CM

## 2020-04-29 MED ORDER — ANASTROZOLE 1 MG PO TABS: 1.0000 mg | ORAL_TABLET | Freq: Every day | ORAL | 0 refills | Status: DC

## 2020-04-30 ENCOUNTER — Telehealth: Payer: Self-pay

## 2020-04-30 NOTE — Progress Notes (Signed)
I have attempted without success to contact this patient by phone three times to do her hypertension  Disease State call. I left a Voice message for patient to return my call.  [place] Pharmacist Assistant [phone]

## 2020-05-07 NOTE — Progress Notes (Signed)
[place]  [phone]) [phone] [phone]) [phone]     ID: Cynthia Mathis DOB: Dec 16, 1932  MR#: [id number]  IDP#:[id number]  Patient Care Team: Cynthia Morale, MD as PCP - General (Family Medicine) Cynthia Spark, MD as PCP - Cardiology (Cardiology) Cynthia Mathis, Cynthia Dad, MD as Consulting Physician (Oncology) Cynthia Kussmaul, MD as Consulting Physician (General Surgery) Cynthia Kaufmann, RN as Oncology Nurse Navigator Cynthia Spark, MD as Consulting Physician (Cardiology) Cynthia Gandy, PA-C (Dermatology) Cynthia Mathis, [facility] as Pharmacist (Pharmacist) Cynthia Cruel, MD OTHER MD:  CHIEF COMPLAINT: estrogen receptor positive breast cancer  CURRENT TREATMENT: anastrozole   INTERVAL HISTORY: Cynthia "Cynthia Mathis" returns today for follow up of her estrogen receptor positive breast cancer accompanied by her son Cynthia Mathis.  She was started on anastrozole on 06/12/2019.  She is tolerating this well, with no hot flashes or vaginal dryness issues.    Recall she had her screening mammography August 2020 leading to surgery November 2020.  She has not had repeat mammography since that time.  Her most recent bone density screening on 06/16/2019 showed a T-score of -0.7, which is considered normal.  REVIEW OF SYSTEMS: Cynthia Mathis but now cannot because her knees hurt.  She saw Cynthia Mathis and had some gel shots which she says were not helpful.  She gave up on the idea of surgery.  She is doing some chair yoga.  A detailed review of systems today was otherwise stable   COVID 19 VACCINATION STATUS:    HISTORY OF CURRENT ILLNESS: From the original intake note:  Cynthia Mathis had routine screening mammography at [facility] on 02/18/2019 showing a possible abnormality in the right breast. She underwent right diagnostic mammography with tomography and right breast ultrasonography at [facility] on 02/25/2019 showing: breast density category B; 0.6 cm  irregular mass at 11 o'clock in the right breast; no abnormal right axillary lymph nodes.  Accordingly on 03/05/2019 she proceeded to biopsy of the right breast area in question. The pathology from this procedure (Cynthia Mathis) showed: invasive ductal carcinoma, grade 1. Prognostic indicators significant for: estrogen receptor, 95% positive and progesterone receptor, 95% positive, both with strong staining intensity. Proliferation marker Ki67 at 10%. HER2 negative by immunohistochemistry (1+).  The patient's subsequent history is as detailed below.   PAST MEDICAL HISTORY: Past Medical History:  Diagnosis Date  . Anxiety   . Borderline diabetes mellitus   . Cancer ([place]) 2020   breast  . Diverticulosis of colon   . DJD (degenerative joint disease)   . Dyslipidemia   . History of hematuria   . History of osteoporosis   . Hx of colonic polyps   . Hypertension   . Hypothyroid   . Macular degeneration    sees Dr. Herbert Mathis for general eye care, sees Dr. Zadie Mathis for retinal injections   . Memory loss   . Osteoporosis    took bisphosphonates, now off. Last DEXA in 2015 showed osteopenia   . Paroxysmal atrial fibrillation (HCC)    sees Dr. Ena Mathis   . SCCA (squamous cell carcinoma) of skin 12/11/2019   Left Hand Posterior (in situ)    PAST SURGICAL HISTORY: Past Surgical History:  Procedure Laterality Date  . APPENDECTOMY  1947  . BREAST LUMPECTOMY WITH RADIOACTIVE SEED LOCALIZATION Right 05/14/2019   Procedure: RIGHT BREAST LUMPECTOMY WITH RADIOACTIVE SEED LOCALIZATION;  Surgeon: Cynthia Kussmaul, MD;  Location: [place];  Service: General;  Laterality: Right;  . CATARACT EXTRACTION W/PHACO  Bilateral   . COLONOSCOPY  12/02/2014   per Dr. Henrene Mathis, severe diverticulosis but no polyps   . MASS EXCISION  04/17/2012   Procedure: EXCISION MASS;  Surgeon: Cynthia Faster. Cornett, MD;  Location: [place];  Service: General;  Laterality: N/A;  . THYROIDECTOMY  1990   Dr. Rise Mathis     FAMILY HISTORY: Family History  Problem Relation Age of Onset  . Stroke Mother   . Colon cancer Neg Hx   . Stomach cancer Neg Hx    Patient's father was 82 years old when he died in [place] War II. Patient's mother died from causes not clear to the patient at age 9.  The patient had 1 brother, no sisters.  The patient denies/or notes a family hx of breast or ovarian cancer.     GYNECOLOGIC HISTORY:  No LMP recorded. Patient is postmenopausal. Menarche: 86 years old Age at first live birth: 86 years old Vian P 3 LMP early 99s Contraceptive no HRT no  Hysterectomy?  No BSO?  No   SOCIAL HISTORY: (updated September 2020)  Cynthia "Alan Mulder" was born in [place], in [place].  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  Cynthia Mathis [place] with him and after living in [place] for some time they moved to [place] where her husband had a job.  He was an Training and development officer.  Their 3 children are Cynthia Mathis 60 who lives in [place] and is an Futures trader; Cynthia Mathis 8 lives in [place] and keeps a day school in her home; and Cynthia Mathis in [place] who works with [employer]. The patient has 7 grandchildren, no great grandchildren.  She is not a Ambulance person.   ADVANCED DIRECTIVES:    HEALTH MAINTENANCE: Social History   Tobacco Use  . Smoking status: Former Smoker    Quit date: 07/10/1965    Years since quitting: 54.8  . Smokeless tobacco: Never Used  . Tobacco comment: 3 cigs per week x 3 years (started in 1964), 12/27/15 does not smoke  Vaping Use  . Vaping Use: Never used  Substance Use Topics  . Alcohol use: Yes    Alcohol/week: 3.0 standard drinks    Types: 3 Glasses of wine per week    Comment: occasionally  . Drug use: No     Colonoscopy: 2016/Cynthia Mathis  PAP: Up-to-date  Bone density: 06/2019, -0.7   Allergies   Allergen Reactions  . Epinephrine Palpitations    heart racing    Current Outpatient Medications  Medication Sig Dispense Refill  . anastrozole (ARIMIDEX) 1 MG tablet Take 1 tablet (1 mg total) by mouth daily. 90 tablet 0  . Calcium Carbonate-Vitamin D (CALCIUM 600+D) 600-400 MG-UNIT per tablet Take 1 tablet by mouth daily.    . Cyanocobalamin (VITAMIN B 12) 500 MCG TABS Take 1,000 mcg by mouth daily.     [name] diltiazem (CARDIZEM) 30 MG tablet Take 1 tablet every 4 hours AS NEEDED for afib heart rate >100 as long as top BP >100. 45 tablet 1  . donepezil (ARICEPT) 10 MG tablet TAKE ONE TABLET BY MOUTH EVERY NIGHT AT BEDTIME 90 tablet 2  . fish oil-omega-3 fatty acids 1000 MG capsule Take 1 g by mouth daily.    . fluorouracil (EFUDEX) 5 % cream Apply topically daily. Apply QHS x 3-4  weeks expect irritation. 40 g 1  . GLUCOSAMINE-CHONDROITIN PO Take 1 tablet by mouth 3 (three) times daily. 1500-1200    . losartan-hydrochlorothiazide (HYZAAR) 100-25 MG tablet Take 1 tablet by mouth daily. 90 tablet 3  . memantine (NAMENDA) 10 MG tablet TAKE ONE TABLET BY MOUTH TWICE A DAY 180 tablet 3  . metoprolol succinate (TOPROL-XL) 100 MG 24 hr tablet Take 1 tablet (100 mg total) by mouth daily. Take with or immediately following a meal. 90 tablet 3  . Multiple Vitamins-Minerals (MULTIVITAMIN & MINERAL PO) Take 1 tablet by mouth daily.    [name] warfarin (COUMADIN) 5 MG tablet Take as directed by [facility] 60 tablet 1   No current facility-administered medications for this visit.    OBJECTIVE: White woman who appears younger than stated age  65:   05/10/20 1208  BP: (!) 141/66  Pulse: (!) 51  Resp: 17  Temp: 97.9 F (36.6 C)  SpO2: 97%     Body mass index is 26.76 kg/m.   Wt Readings from Last 3 Encounters:  05/10/20 146 lb 4.8 oz (66.4 kg)  04/23/20 146 lb 6.4 oz (66.4 kg)  04/19/20 141 lb 1.5 oz (64 kg)      ECOG FS:1 - Symptomatic but completely ambulatory  Sclerae unicteric, EOMs  intact Wearing a mask No cervical or supraclavicular adenopathy Lungs no rales or rhonchi Heart regular rate and rhythm Abd soft, nontender, positive bowel sounds MSK no focal spinal tenderness, no upper extremity lymphedema Neuro: nonfocal, well oriented, appropriate affect Breasts: Status post right lumpectomy, with no evidence of disease recurrence.  Left breast is benign.  Both axillae are benign.   LAB RESULTS:  CMP     Component Value Date/Time   NA 141 05/10/2020 1136   NA 146 (H) 02/05/2019 0852   K 3.6 05/10/2020 1136   CL 105 05/10/2020 1136   CO2 26 05/10/2020 1136   GLUCOSE 236 (H) 05/10/2020 1136   BUN 23 05/10/2020 1136   BUN 18 02/05/2019 0852   CREATININE 1.03 (H) 05/10/2020 1136   CREATININE 1.06 (H) 03/26/2019 1521   CALCIUM 9.7 05/10/2020 1136   PROT 7.0 05/10/2020 1136   PROT 7.0 02/05/2019 0852   ALBUMIN 3.7 05/10/2020 1136   ALBUMIN 4.4 02/05/2019 0852   AST 21 05/10/2020 1136   AST 17 03/26/2019 1521   ALT 22 05/10/2020 1136   ALT 17 03/26/2019 1521   ALKPHOS 60 05/10/2020 1136   BILITOT 0.6 05/10/2020 1136   BILITOT 0.3 03/26/2019 1521   GFRNONAA 53 (L) 05/10/2020 1136   GFRNONAA 48 (L) 03/26/2019 1521   GFRAA >60 09/10/2019 1344   GFRAA 56 (L) 03/26/2019 1521    No results found for: TOTALPROTELP, ALBUMINELP, A1GS, A2GS, BETS, BETA2SER, GAMS, MSPIKE, SPEI  No results found for: KPAFRELGTCHN, LAMBDASER, KAPLAMBRATIO  Lab Results  Component Value Date   WBC 10.6 (H) 05/10/2020   NEUTROABS 6.4 05/10/2020   HGB 14.1 05/10/2020   HCT 41.1 05/10/2020   MCV 90.3 05/10/2020   PLT 222 05/10/2020   No results found for: LABCA2  No components found for: BHALPF790  No results for input(s): INR in the last 168 hours.  No results found for: LABCA2  No results found for: WIO973  No results found for: ZHG992  No results found for: EQA834  No results found for: CA2729  No components found for: HGQUANT  No results found for: CEA1 / No  results found for: CEA1   No results found  for: AFPTUMOR  No results found for: CHROMOGRNA  No results found for: HGBA, HGBA2QUANT, HGBFQUANT, HGBSQUAN (Hemoglobinopathy evaluation)   No results found for: LDH  Lab Results  Component Value Date   IRON 72 05/15/2012   IRONPCTSAT 24.6 05/15/2012   (Iron and TIBC)  No results found for: FERRITIN  Urinalysis    Component Value Date/Time   COLORURINE LT YELLOW 11/12/2007 0945   APPEARANCEUR Clear 11/12/2007 0945   LABSPEC < OR = 1.005 11/12/2007 0945   PHURINE 5.5 11/12/2007 0945   GLUCOSEU NEGATIVE 11/12/2007 0945   BILIRUBINUR negative 02/12/2019 1056   KETONESUR NEGATIVE 11/12/2007 0945   PROTEINUR Negative 02/12/2019 1056   UROBILINOGEN 0.2 02/12/2019 1056   UROBILINOGEN 0.2 mg/dL 11/12/2007 0945   NITRITE negative 02/12/2019 1056   NITRITE Negative 11/12/2007 0945   LEUKOCYTESUR Negative 02/12/2019 1056    STUDIES: DG Chest 2 View  Result Date: 04/19/2020 CLINICAL DATA:  86 year old female with atrial fibrillation, palpitations. EXAM: CHEST - 2 VIEW COMPARISON:  Chest radiographs 06/29/2016 and earlier. FINDINGS: Lung volumes are stable and within normal limits. Heart size is stable since 2017 and within normal limits. Other mediastinal contours are within normal limits. Visualized tracheal air column is within normal limits. No pneumothorax, pulmonary edema, pleural effusion or confluent pulmonary opacity. Right chest wall surgical clips. Thoracic and lumbar endplate degeneration. No acute osseous abnormality identified. Negative visible bowel gas pattern. Abdominal Calcified aortic atherosclerosis. IMPRESSION: 1. No acute cardiopulmonary abnormality. 2.  Aortic Atherosclerosis (ICD10-I70.0). Electronically Signed   By: Genevie Ann M.D.   On: 04/19/2020 21:28   Intravitreal Injection, Pharmacologic Agent - OD - Right Eye  Result Date: 04/12/2020 Time Out 04/12/2020. 11:10 AM. Confirmed correct patient, procedure, site, and  patient consented. Anesthesia Topical anesthesia was used. Anesthetic medications included Akten 3.5%. Procedure Preparation included 5% betadine to ocular surface, 10% betadine to eyelids, Tobramycin 0.3%, Ofloxacin . A 30 gauge needle was used. Injection: 1.25 mg Bevacizumab (AVASTIN) SOLN   NDC: 70360-001-02, Lot: [id number]   Route: Intravitreal, Site: Right Eye, Waste: 0 mg Post-op Post injection exam found visual acuity of at least counting fingers. The patient tolerated the procedure well. There were no complications. The patient received written and verbal post procedure care education. Post injection medications were not given.   OCT, Retina - OU - Both Eyes  Result Date: 04/12/2020 Right Eye Quality was good. Scan locations included subfoveal. Central Foveal Thickness: 207. Findings include no IRF, abnormal foveal contour, no SRF, subretinal scarring. Left Eye Quality was good. Scan locations included subfoveal. Central Foveal Thickness: 235. Findings include inner retinal atrophy, abnormal foveal contour, no SRF, subretinal scarring.     ELIGIBLE FOR AVAILABLE RESEARCH PROTOCOL: no  ASSESSMENT: 86 y.o. [place] woman status post right breast upper inner quadrant biopsy 03/05/2019 for a clinical T1b N0, stage IA invasive ductal carcinoma, grade 1, estrogen and progesterone receptor positive, HER-2 not amplified, with an MIB-1 of 10%  (1) status post right lumpectomy with no sentinel lymph node sampling 05/14/2019 for a pT1b pNX, stage IA invasive ductal carcinoma, grade 1, with negative margins  (2) started anastrozole November 2020  (a) bone density 06/16/2019 normal (T = -0.7)   PLAN: "Cynthia Mathis" is doing well on anastrozole.  The plan will be to continue that for a total of 5 years.  She continues to have knee problems.  She is not planning on surgery.  The gel shots she received through Cynthia Mathis did not really help all that much she  says.  Basically she is given up on  pickleball.  She is not a swimmer and does not have any exercise equipment at home.  I suggested possibly a stationary bike might be helpful.  She does do chair yoga and that is helpful.  She is due for mammography and tells me she has already seen her gynecologist this year.  We are placing that order through the breast center.  She will then see me again next year in November  She knows to call for any other issue that may develop before the next visit  Total encounter time 25 minutes.Sarajane Jews C. Latravious Levitt, MD  05/10/2020 12:32 PM Medical Oncology and Hematology [facility] [place], [place] [address] Tel. [phone]    Fax. [phone]   I, Wilburn Mylar, am acting as scribe for Dr. Virgie Mathis. Robie Oats.  I, [name] MD, have reviewed the above documentation for accuracy and completeness, and I agree with the above.   *Total Encounter Time as defined by the Centers for Medicare and Medicaid Services includes, in addition to the face-to-face time of a patient visit (documented in the note above) non-face-to-face time: obtaining and reviewing outside history, ordering and reviewing medications, tests or procedures, care coordination (communications with other health care professionals or caregivers) and documentation in the medical record.

## 2020-05-10 ENCOUNTER — Other Ambulatory Visit: Payer: Self-pay

## 2020-05-10 ENCOUNTER — Inpatient Hospital Stay: Payer: Medicare Other | Attending: Oncology

## 2020-05-10 ENCOUNTER — Inpatient Hospital Stay (HOSPITAL_BASED_OUTPATIENT_CLINIC_OR_DEPARTMENT_OTHER): Payer: Medicare Other | Admitting: Oncology

## 2020-05-10 VITALS — BP 141/66 | HR 51 | Temp 97.9°F | Resp 17 | Ht 62.0 in | Wt 146.3 lb

## 2020-05-10 DIAGNOSIS — Z79899 Other long term (current) drug therapy: Secondary | ICD-10-CM | POA: Diagnosis not present

## 2020-05-10 DIAGNOSIS — Z79811 Long term (current) use of aromatase inhibitors: Secondary | ICD-10-CM | POA: Diagnosis not present

## 2020-05-10 DIAGNOSIS — I700 Atherosclerosis of aorta: Secondary | ICD-10-CM

## 2020-05-10 DIAGNOSIS — I480 Paroxysmal atrial fibrillation: Secondary | ICD-10-CM | POA: Diagnosis not present

## 2020-05-10 DIAGNOSIS — Z87891 Personal history of nicotine dependence: Secondary | ICD-10-CM | POA: Insufficient documentation

## 2020-05-10 DIAGNOSIS — Z170 Estrogen receptor positive status [ER+]: Secondary | ICD-10-CM

## 2020-05-10 DIAGNOSIS — Z85828 Personal history of other malignant neoplasm of skin: Secondary | ICD-10-CM | POA: Diagnosis not present

## 2020-05-10 DIAGNOSIS — Z7901 Long term (current) use of anticoagulants: Secondary | ICD-10-CM | POA: Diagnosis not present

## 2020-05-10 DIAGNOSIS — I10 Essential (primary) hypertension: Secondary | ICD-10-CM | POA: Insufficient documentation

## 2020-05-10 DIAGNOSIS — E039 Hypothyroidism, unspecified: Secondary | ICD-10-CM | POA: Diagnosis not present

## 2020-05-10 DIAGNOSIS — C50411 Malignant neoplasm of upper-outer quadrant of right female breast: Secondary | ICD-10-CM

## 2020-05-10 DIAGNOSIS — G3184 Mild cognitive impairment, so stated: Secondary | ICD-10-CM

## 2020-05-10 DIAGNOSIS — F419 Anxiety disorder, unspecified: Secondary | ICD-10-CM | POA: Insufficient documentation

## 2020-05-10 DIAGNOSIS — M810 Age-related osteoporosis without current pathological fracture: Secondary | ICD-10-CM | POA: Diagnosis not present

## 2020-05-10 DIAGNOSIS — C50211 Malignant neoplasm of upper-inner quadrant of right female breast: Secondary | ICD-10-CM | POA: Diagnosis not present

## 2020-05-10 DIAGNOSIS — M8589 Other specified disorders of bone density and structure, multiple sites: Secondary | ICD-10-CM

## 2020-05-10 DIAGNOSIS — E785 Hyperlipidemia, unspecified: Secondary | ICD-10-CM | POA: Insufficient documentation

## 2020-05-10 LAB — CBC WITH DIFFERENTIAL/PLATELET
Abs Immature Granulocytes: 0.05 10*3/uL (ref 0.00–0.07)
Basophils Absolute: 0.1 10*3/uL (ref 0.0–0.1)
Basophils Relative: 1 %
Eosinophils Absolute: 0.1 10*3/uL (ref 0.0–0.5)
Eosinophils Relative: 1 %
HCT: 41.1 % (ref 36.0–46.0)
Hemoglobin: 14.1 g/dL (ref 12.0–15.0)
Immature Granulocytes: 1 %
Lymphocytes Relative: 29 %
Lymphs Abs: 3 10*3/uL (ref 0.7–4.0)
MCH: 31 pg (ref 26.0–34.0)
MCHC: 34.3 g/dL (ref 30.0–36.0)
MCV: 90.3 fL (ref 80.0–100.0)
Monocytes Absolute: 0.9 10*3/uL (ref 0.1–1.0)
Monocytes Relative: 8 %
Neutro Abs: 6.4 10*3/uL (ref 1.7–7.7)
Neutrophils Relative %: 60 %
Platelets: 222 10*3/uL (ref 150–400)
RBC: 4.55 MIL/uL (ref 3.87–5.11)
RDW: 12.7 % (ref 11.5–15.5)
WBC: 10.6 10*3/uL — ABNORMAL HIGH (ref 4.0–10.5)
nRBC: 0 % (ref 0.0–0.2)

## 2020-05-10 LAB — COMPREHENSIVE METABOLIC PANEL
ALT: 22 U/L (ref 0–44)
AST: 21 U/L (ref 15–41)
Albumin: 3.7 g/dL (ref 3.5–5.0)
Alkaline Phosphatase: 60 U/L (ref 38–126)
Anion gap: 10 (ref 5–15)
BUN: 23 mg/dL (ref 8–23)
CO2: 26 mmol/L (ref 22–32)
Calcium: 9.7 mg/dL (ref 8.9–10.3)
Chloride: 105 mmol/L (ref 98–111)
Creatinine, Ser: 1.03 mg/dL — ABNORMAL HIGH (ref 0.44–1.00)
GFR, Estimated: 53 mL/min — ABNORMAL LOW (ref >60)
Glucose, Bld: 236 mg/dL — ABNORMAL HIGH (ref 70–99)
Potassium: 3.6 mmol/L (ref 3.5–5.1)
Sodium: 141 mmol/L (ref 135–145)
Total Bilirubin: 0.6 mg/dL (ref 0.3–1.2)
Total Protein: 7 g/dL (ref 6.5–8.1)

## 2020-05-10 MED ORDER — ANASTROZOLE 1 MG PO TABS: 1.0000 mg | ORAL_TABLET | Freq: Every day | ORAL | 0 refills | Status: DC

## 2020-05-12 ENCOUNTER — Ambulatory Visit: Payer: Medicare Other | Admitting: Physician Assistant

## 2020-05-13 ENCOUNTER — Encounter: Payer: Self-pay | Admitting: Dermatology

## 2020-05-13 ENCOUNTER — Ambulatory Visit: Payer: Medicare Other | Admitting: Dermatology

## 2020-05-13 ENCOUNTER — Other Ambulatory Visit: Payer: Self-pay

## 2020-05-13 DIAGNOSIS — Z85828 Personal history of other malignant neoplasm of skin: Secondary | ICD-10-CM

## 2020-05-13 DIAGNOSIS — L570 Actinic keratosis: Secondary | ICD-10-CM

## 2020-05-18 ENCOUNTER — Other Ambulatory Visit: Payer: Self-pay

## 2020-05-18 ENCOUNTER — Ambulatory Visit: Payer: Medicare Other | Admitting: *Deleted

## 2020-05-18 DIAGNOSIS — I480 Paroxysmal atrial fibrillation: Secondary | ICD-10-CM

## 2020-05-18 DIAGNOSIS — Z5181 Encounter for therapeutic drug level monitoring: Secondary | ICD-10-CM | POA: Diagnosis not present

## 2020-05-18 LAB — POCT INR: INR: 2.6 (ref 2.0–3.0)

## 2020-05-18 NOTE — Patient Instructions (Signed)
Description   Continue taking Warfarin 1/2 tablet daily. Recheck INR in 4 weeks. Call us with any medication changes or if scheduled for any procedures [phone].

## 2020-06-01 ENCOUNTER — Encounter: Payer: Self-pay | Admitting: Family Medicine

## 2020-06-01 DIAGNOSIS — Z853 Personal history of malignant neoplasm of breast: Secondary | ICD-10-CM | POA: Diagnosis not present

## 2020-06-03 ENCOUNTER — Encounter: Payer: Self-pay | Admitting: Dermatology

## 2020-06-07 DIAGNOSIS — E119 Type 2 diabetes mellitus without complications: Secondary | ICD-10-CM | POA: Diagnosis not present

## 2020-06-07 DIAGNOSIS — H40013 Open angle with borderline findings, low risk, bilateral: Secondary | ICD-10-CM | POA: Diagnosis not present

## 2020-06-07 DIAGNOSIS — H524 Presbyopia: Secondary | ICD-10-CM | POA: Diagnosis not present

## 2020-06-07 DIAGNOSIS — H43393 Other vitreous opacities, bilateral: Secondary | ICD-10-CM | POA: Diagnosis not present

## 2020-06-07 DIAGNOSIS — H353232 Exudative age-related macular degeneration, bilateral, with inactive choroidal neovascularization: Secondary | ICD-10-CM | POA: Diagnosis not present

## 2020-06-07 LAB — HM DIABETES EYE EXAM

## 2020-06-10 ENCOUNTER — Emergency Department (HOSPITAL_COMMUNITY): Payer: Medicare Other

## 2020-06-10 ENCOUNTER — Inpatient Hospital Stay (HOSPITAL_COMMUNITY): Payer: Medicare Other

## 2020-06-10 ENCOUNTER — Other Ambulatory Visit: Payer: Self-pay

## 2020-06-10 ENCOUNTER — Encounter (HOSPITAL_COMMUNITY): Payer: Self-pay | Admitting: Emergency Medicine

## 2020-06-10 ENCOUNTER — Inpatient Hospital Stay (HOSPITAL_COMMUNITY)
Admission: EM | Admit: 2020-06-10 | Discharge: 2020-06-19 | DRG: 493 | Disposition: A | Discharge status: Skilled Nursing Facility | Payer: Medicare Other | Attending: Internal Medicine | Admitting: Internal Medicine

## 2020-06-10 DIAGNOSIS — E46 Unspecified protein-calorie malnutrition: Secondary | ICD-10-CM | POA: Diagnosis not present

## 2020-06-10 DIAGNOSIS — D72829 Elevated white blood cell count, unspecified: Secondary | ICD-10-CM | POA: Diagnosis not present

## 2020-06-10 DIAGNOSIS — W19XXXA Unspecified fall, initial encounter: Secondary | ICD-10-CM | POA: Diagnosis present

## 2020-06-10 DIAGNOSIS — S82831A Other fracture of upper and lower end of right fibula, initial encounter for closed fracture: Secondary | ICD-10-CM | POA: Diagnosis not present

## 2020-06-10 DIAGNOSIS — M810 Age-related osteoporosis without current pathological fracture: Secondary | ICD-10-CM | POA: Diagnosis not present

## 2020-06-10 DIAGNOSIS — E119 Type 2 diabetes mellitus without complications: Secondary | ICD-10-CM | POA: Diagnosis not present

## 2020-06-10 DIAGNOSIS — M2550 Pain in unspecified joint: Secondary | ICD-10-CM | POA: Diagnosis not present

## 2020-06-10 DIAGNOSIS — I499 Cardiac arrhythmia, unspecified: Secondary | ICD-10-CM | POA: Diagnosis not present

## 2020-06-10 DIAGNOSIS — Z7901 Long term (current) use of anticoagulants: Secondary | ICD-10-CM

## 2020-06-10 DIAGNOSIS — I471 Supraventricular tachycardia: Secondary | ICD-10-CM | POA: Diagnosis not present

## 2020-06-10 DIAGNOSIS — S8261XA Displaced fracture of lateral malleolus of right fibula, initial encounter for closed fracture: Secondary | ICD-10-CM | POA: Diagnosis not present

## 2020-06-10 DIAGNOSIS — S82851G Displaced trimalleolar fracture of right lower leg, subsequent encounter for closed fracture with delayed healing: Secondary | ICD-10-CM | POA: Diagnosis not present

## 2020-06-10 DIAGNOSIS — R55 Syncope and collapse: Secondary | ICD-10-CM

## 2020-06-10 DIAGNOSIS — Z85828 Personal history of other malignant neoplasm of skin: Secondary | ICD-10-CM | POA: Diagnosis not present

## 2020-06-10 DIAGNOSIS — S82851A Displaced trimalleolar fracture of right lower leg, initial encounter for closed fracture: Secondary | ICD-10-CM | POA: Diagnosis not present

## 2020-06-10 DIAGNOSIS — N390 Urinary tract infection, site not specified: Secondary | ICD-10-CM | POA: Diagnosis not present

## 2020-06-10 DIAGNOSIS — F419 Anxiety disorder, unspecified: Secondary | ICD-10-CM | POA: Diagnosis present

## 2020-06-10 DIAGNOSIS — Z888 Allergy status to other drugs, medicaments and biological substances status: Secondary | ICD-10-CM

## 2020-06-10 DIAGNOSIS — Z4789 Encounter for other orthopedic aftercare: Secondary | ICD-10-CM | POA: Diagnosis not present

## 2020-06-10 DIAGNOSIS — Z823 Family history of stroke: Secondary | ICD-10-CM

## 2020-06-10 DIAGNOSIS — R2681 Unsteadiness on feet: Secondary | ICD-10-CM | POA: Diagnosis not present

## 2020-06-10 DIAGNOSIS — Z743 Need for continuous supervision: Secondary | ICD-10-CM | POA: Diagnosis not present

## 2020-06-10 DIAGNOSIS — Z20822 Contact with and (suspected) exposure to covid-19: Secondary | ICD-10-CM | POA: Diagnosis present

## 2020-06-10 DIAGNOSIS — Z8719 Personal history of other diseases of the digestive system: Secondary | ICD-10-CM

## 2020-06-10 DIAGNOSIS — I10 Essential (primary) hypertension: Secondary | ICD-10-CM | POA: Diagnosis not present

## 2020-06-10 DIAGNOSIS — Z4889 Encounter for other specified surgical aftercare: Secondary | ICD-10-CM | POA: Diagnosis not present

## 2020-06-10 DIAGNOSIS — G3184 Mild cognitive impairment, so stated: Secondary | ICD-10-CM | POA: Diagnosis present

## 2020-06-10 DIAGNOSIS — Z79811 Long term (current) use of aromatase inhibitors: Secondary | ICD-10-CM

## 2020-06-10 DIAGNOSIS — Z87891 Personal history of nicotine dependence: Secondary | ICD-10-CM

## 2020-06-10 DIAGNOSIS — R531 Weakness: Secondary | ICD-10-CM | POA: Diagnosis not present

## 2020-06-10 DIAGNOSIS — Z79899 Other long term (current) drug therapy: Secondary | ICD-10-CM

## 2020-06-10 DIAGNOSIS — R001 Bradycardia, unspecified: Secondary | ICD-10-CM | POA: Diagnosis not present

## 2020-06-10 DIAGNOSIS — E538 Deficiency of other specified B group vitamins: Secondary | ICD-10-CM | POA: Diagnosis not present

## 2020-06-10 DIAGNOSIS — G309 Alzheimer's disease, unspecified: Secondary | ICD-10-CM | POA: Diagnosis not present

## 2020-06-10 DIAGNOSIS — S8255XA Nondisplaced fracture of medial malleolus of left tibia, initial encounter for closed fracture: Secondary | ICD-10-CM | POA: Diagnosis not present

## 2020-06-10 DIAGNOSIS — E785 Hyperlipidemia, unspecified: Secondary | ICD-10-CM | POA: Diagnosis present

## 2020-06-10 DIAGNOSIS — I480 Paroxysmal atrial fibrillation: Secondary | ICD-10-CM

## 2020-06-10 DIAGNOSIS — G8918 Other acute postprocedural pain: Secondary | ICD-10-CM | POA: Diagnosis not present

## 2020-06-10 DIAGNOSIS — B9620 Unspecified Escherichia coli [E. coli] as the cause of diseases classified elsewhere: Secondary | ICD-10-CM | POA: Diagnosis present

## 2020-06-10 DIAGNOSIS — M8589 Other specified disorders of bone density and structure, multiple sites: Secondary | ICD-10-CM

## 2020-06-10 DIAGNOSIS — S82401A Unspecified fracture of shaft of right fibula, initial encounter for closed fracture: Secondary | ICD-10-CM | POA: Diagnosis not present

## 2020-06-10 DIAGNOSIS — S82831D Other fracture of upper and lower end of right fibula, subsequent encounter for closed fracture with routine healing: Secondary | ICD-10-CM | POA: Diagnosis not present

## 2020-06-10 DIAGNOSIS — R52 Pain, unspecified: Secondary | ICD-10-CM | POA: Diagnosis not present

## 2020-06-10 DIAGNOSIS — Z853 Personal history of malignant neoplasm of breast: Secondary | ICD-10-CM

## 2020-06-10 DIAGNOSIS — Z419 Encounter for procedure for purposes other than remedying health state, unspecified: Secondary | ICD-10-CM

## 2020-06-10 DIAGNOSIS — H3530 Unspecified macular degeneration: Secondary | ICD-10-CM | POA: Diagnosis present

## 2020-06-10 DIAGNOSIS — I495 Sick sinus syndrome: Secondary | ICD-10-CM | POA: Diagnosis present

## 2020-06-10 DIAGNOSIS — S82841A Displaced bimalleolar fracture of right lower leg, initial encounter for closed fracture: Secondary | ICD-10-CM

## 2020-06-10 DIAGNOSIS — E039 Hypothyroidism, unspecified: Secondary | ICD-10-CM | POA: Diagnosis not present

## 2020-06-10 DIAGNOSIS — S82851D Displaced trimalleolar fracture of right lower leg, subsequent encounter for closed fracture with routine healing: Secondary | ICD-10-CM | POA: Diagnosis not present

## 2020-06-10 DIAGNOSIS — M25571 Pain in right ankle and joints of right foot: Secondary | ICD-10-CM | POA: Diagnosis not present

## 2020-06-10 DIAGNOSIS — R609 Edema, unspecified: Secondary | ICD-10-CM | POA: Diagnosis not present

## 2020-06-10 DIAGNOSIS — K5900 Constipation, unspecified: Secondary | ICD-10-CM | POA: Diagnosis not present

## 2020-06-10 DIAGNOSIS — M1990 Unspecified osteoarthritis, unspecified site: Secondary | ICD-10-CM | POA: Diagnosis not present

## 2020-06-10 DIAGNOSIS — R519 Headache, unspecified: Secondary | ICD-10-CM | POA: Diagnosis not present

## 2020-06-10 DIAGNOSIS — R6889 Other general symptoms and signs: Secondary | ICD-10-CM | POA: Diagnosis not present

## 2020-06-10 DIAGNOSIS — M8580 Other specified disorders of bone density and structure, unspecified site: Secondary | ICD-10-CM | POA: Diagnosis present

## 2020-06-10 LAB — CBC WITH DIFFERENTIAL/PLATELET
Abs Immature Granulocytes: 0.07 10*3/uL (ref 0.00–0.07)
Basophils Absolute: 0.1 10*3/uL (ref 0.0–0.1)
Basophils Relative: 1 %
Eosinophils Absolute: 0.1 10*3/uL (ref 0.0–0.5)
Eosinophils Relative: 0 %
HCT: 41.9 % (ref 36.0–46.0)
Hemoglobin: 14.6 g/dL (ref 12.0–15.0)
Immature Granulocytes: 1 %
Lymphocytes Relative: 18 %
Lymphs Abs: 2.7 10*3/uL (ref 0.7–4.0)
MCH: 32.2 pg (ref 26.0–34.0)
MCHC: 34.8 g/dL (ref 30.0–36.0)
MCV: 92.3 fL (ref 80.0–100.0)
Monocytes Absolute: 1 10*3/uL (ref 0.1–1.0)
Monocytes Relative: 7 %
Neutro Abs: 11.1 10*3/uL — ABNORMAL HIGH (ref 1.7–7.7)
Neutrophils Relative %: 73 %
Platelets: 287 10*3/uL (ref 150–400)
RBC: 4.54 MIL/uL (ref 3.87–5.11)
RDW: 12.5 % (ref 11.5–15.5)
WBC: 15 10*3/uL — ABNORMAL HIGH (ref 4.0–10.5)
nRBC: 0 % (ref 0.0–0.2)

## 2020-06-10 LAB — BASIC METABOLIC PANEL
Anion gap: 15 (ref 5–15)
BUN: 17 mg/dL (ref 8–23)
CO2: 23 mmol/L (ref 22–32)
Calcium: 9.4 mg/dL (ref 8.9–10.3)
Chloride: 101 mmol/L (ref 98–111)
Creatinine, Ser: 0.84 mg/dL (ref 0.44–1.00)
GFR, Estimated: >60 mL/min (ref >60)
Glucose, Bld: 160 mg/dL — ABNORMAL HIGH (ref 70–99)
Potassium: 4.6 mmol/L (ref 3.5–5.1)
Sodium: 139 mmol/L (ref 135–145)

## 2020-06-10 LAB — RESP PANEL BY RT-PCR (FLU A&B, COVID) ARPGX2
Influenza A by PCR: NEGATIVE
Influenza B by PCR: NEGATIVE
SARS Coronavirus 2 by RT PCR: NEGATIVE

## 2020-06-10 LAB — URINALYSIS, ROUTINE W REFLEX MICROSCOPIC
Bilirubin Urine: NEGATIVE
Glucose, UA: NEGATIVE mg/dL
Ketones, ur: NEGATIVE mg/dL
Nitrite: POSITIVE — AB
Protein, ur: NEGATIVE mg/dL
Specific Gravity, Urine: 1.017 (ref 1.005–1.030)
pH: 5 (ref 5.0–8.0)

## 2020-06-10 LAB — PROTIME-INR
INR: 2.4 — ABNORMAL HIGH (ref 0.8–1.2)
Prothrombin Time: 25 seconds — ABNORMAL HIGH (ref 11.4–15.2)

## 2020-06-10 MED ORDER — HYDROCODONE-ACETAMINOPHEN 5-325 MG PO TABS
1.0000 | ORAL_TABLET | Freq: Four times a day (QID) | ORAL | Status: DC | PRN
  Administered 2020-06-11 – 2020-06-17 (×6): 1 via ORAL
  Filled 2020-06-10 (×7): qty 1

## 2020-06-10 MED ORDER — SODIUM CHLORIDE 0.9 % IV SOLN
1.0000 g | INTRAVENOUS | Status: DC
  Administered 2020-06-10 – 2020-06-12 (×3): 1 g via INTRAVENOUS
  Filled 2020-06-10 (×3): qty 10

## 2020-06-10 MED ORDER — LIDOCAINE-EPINEPHRINE 1 %-1:100000 IJ SOLN
10.0000 mL | Freq: Once | INTRAMUSCULAR | Status: AC
  Administered 2020-06-10: 10 mL via INTRADERMAL
  Filled 2020-06-10: qty 1

## 2020-06-10 MED ORDER — MEMANTINE HCL 10 MG PO TABS
10.0000 mg | ORAL_TABLET | Freq: Two times a day (BID) | ORAL | Status: DC
  Administered 2020-06-10 – 2020-06-19 (×19): 10 mg via ORAL
  Filled 2020-06-10 (×22): qty 1

## 2020-06-10 MED ORDER — HYDROCHLOROTHIAZIDE 25 MG PO TABS
25.0000 mg | ORAL_TABLET | Freq: Every day | ORAL | Status: DC
  Administered 2020-06-10 – 2020-06-19 (×10): 25 mg via ORAL
  Filled 2020-06-10 (×10): qty 1

## 2020-06-10 MED ORDER — LOSARTAN POTASSIUM 50 MG PO TABS
100.0000 mg | ORAL_TABLET | Freq: Every day | ORAL | Status: DC
  Administered 2020-06-10 – 2020-06-19 (×10): 100 mg via ORAL
  Filled 2020-06-10 (×10): qty 2

## 2020-06-10 MED ORDER — MORPHINE SULFATE (PF) 2 MG/ML IV SOLN: 0.5000 mg | INTRAVENOUS | Status: DC | PRN

## 2020-06-10 MED ORDER — DOCUSATE SODIUM 100 MG PO CAPS
100.0000 mg | ORAL_CAPSULE | Freq: Two times a day (BID) | ORAL | Status: DC
  Administered 2020-06-10 – 2020-06-19 (×18): 100 mg via ORAL
  Filled 2020-06-10 (×18): qty 1

## 2020-06-10 MED ORDER — FENTANYL CITRATE (PF) 100 MCG/2ML IJ SOLN
75.0000 ug | Freq: Once | INTRAMUSCULAR | Status: AC
  Administered 2020-06-10: 75 ug via INTRAMUSCULAR
  Filled 2020-06-10: qty 2

## 2020-06-10 MED ORDER — ANASTROZOLE 1 MG PO TABS
1.0000 mg | ORAL_TABLET | Freq: Every day | ORAL | Status: DC
  Administered 2020-06-10 – 2020-06-19 (×9): 1 mg via ORAL
  Filled 2020-06-10 (×12): qty 1

## 2020-06-10 NOTE — Progress Notes (Signed)
Orthopedic Tech Progress Note Patient Details:  [name] 1932-09-13 [id number]  Ortho Devices Type of Ortho Device: Post (short leg) splint, Stirrup splint Ortho Device/Splint Location: RLE Ortho Device/Splint Interventions: Ordered, Application   Post Interventions Patient Tolerated: Well Instructions Provided: Care of device, Poper ambulation with device   Korion Cuevas 06/10/2020, 1:46 PM

## 2020-06-10 NOTE — Progress Notes (Signed)
[facility] for Heparin Indication: atrial fibrillation  Allergies  Allergen Reactions  . Epinephrine Palpitations    heart racing    Patient Measurements:   Heparin Dosing Weight: 66.4kg  Vital Signs: Temp: 98.9 F (37.2 C) (12/02 1306) Temp Source: Oral (12/02 1306) BP: 140/45 (12/02 1955) Pulse Rate: 52 (12/02 1955)  Labs: Recent Labs    06/10/20 1303 06/10/20 1943  HGB 14.6  --   HCT 41.9  --   PLT 287  --   LABPROT  --  25.0*  INR  --  2.4*  CREATININE 0.84  --     CrCl cannot be calculated (Unknown ideal weight.).   Medical History: Past Medical History:  Diagnosis Date  . Anxiety   . Borderline diabetes mellitus   . Cancer ([place]) 2020   breast  . Diverticulosis of colon   . DJD (degenerative joint disease)   . Dyslipidemia   . History of hematuria   . History of osteoporosis   . Hx of colonic polyps   . Hypertension   . Hypothyroid   . Macular degeneration    sees [name] for general eye care, sees [name] for retinal injections   . Memory loss   . Osteoporosis    took bisphosphonates, now off. Last DEXA in 2015 showed osteopenia   . Paroxysmal atrial fibrillation (HCC)    sees [name]   . SCCA (squamous cell carcinoma) of skin 12/11/2019   Left Hand Posterior (in situ)    Medications:  Scheduled:  . anastrozole  1 mg Oral Daily  . docusate sodium  100 mg Oral BID  . hydrochlorothiazide  25 mg Oral Daily  . losartan  100 mg Oral Daily  . memantine  10 mg Oral BID    Assessment: Patient is a 53 yof that is being admitted after passing out. Patient was found to have ankle fracture with plans for ortho taking the patient to the OR tomorrow for an ORIF. Patient takes warfarin at home for afib and with plans for a procedure pharmacy has been asked to bridge the patient with heparin while holding warfarin.   Goal of Therapy:  Heparin level 0.3-0.7 units/ml Monitor platelets by  anticoagulation protocol: Yes   Plan:  - Will hold warfarin this evening  - Will recheck PT-INR with morning labs if INR is < 2.0 or patient received reversal (Vitamin K or Kcentra) will start heparin drip at that time.  - Monitor patient for s/s of bleeding and CBC while on heparin   Duanne Limerick PharmD. BCPS  06/10/2020,8:24 PM

## 2020-06-10 NOTE — Progress Notes (Signed)
   06/10/20 1228  TOC ED Mini Assessment  TOC Time spent with patient (minutes): 30  TOC Time saved using PING (minutes): 15  PING Used in TOC Assessment Yes  Admission or Readmission Diverted Yes  Interventions which prevented an admission or readmission DME Provided  What brought you to the Emergency Department?  fall  Barriers to Discharge Continued Medical Work up    Fuller Mandril, RN, BSN, [place] [phone] Pt qualifies for DME rolling walker.  DME  ordered through [employer].  Freda Munro of [place] notified to deliver rolling walker to pt room prior to D/C home.

## 2020-06-10 NOTE — ED Provider Notes (Signed)
[facility] DEPART[facility] Provider Note   CSN: [id number] Arrival date & time: 06/10/20  1035     History Chief Complaint  Patient presents with  . Loss of Consciousness  . Ankle Injury    Cynthia Mathis is a 86 y.o. female.  86 yo F with a cc of R ankle pain.  Patient fell this morning when she was standing up to get off the toilet.  She not sure exactly why she fell.  She thinks maybe she passed out when she stood up.  Denied feeling lightheaded when she was on the toilet.  Denied any symptoms prior to passing out.  Complaining of right ankle pain.  Denies any other injury.  She is not sure if she struck her head.  The history is provided by the patient and the EMS personnel.  Loss of Consciousness Episode history:  Single Most recent episode:  Today Duration:  2 minutes Timing:  Constant Progression:  Worsening Chronicity:  New Witnessed: no   Relieved by:  Nothing Worsened by:  Nothing Ineffective treatments:  None tried Associated symptoms: no chest pain, no dizziness, no fever, no headaches, no nausea, no palpitations, no shortness of breath and no vomiting   Ankle Injury Pertinent negatives include no chest pain, no headaches and no shortness of breath.       Past Medical History:  Diagnosis Date  . Anxiety   . Borderline diabetes mellitus   . Cancer ([place]) 2020   breast  . Diverticulosis of colon   . DJD (degenerative joint disease)   . Dyslipidemia   . History of hematuria   . History of osteoporosis   . Hx of colonic polyps   . Hypertension   . Hypothyroid   . Macular degeneration    sees [name] for general eye care, sees [name] for retinal injections   . Memory loss   . Osteoporosis    took bisphosphonates, now off. Last DEXA in 2015 showed osteopenia   . Paroxysmal atrial fibrillation ([facility])    sees [name]   . SCCA (squamous cell carcinoma) of skin 12/11/2019   Left Hand Posterior (in situ)     Patient Active Problem List   Diagnosis Date Noted  . Bimalleolar fracture of right ankle 06/10/2020  . Aortic atherosclerosis ([place]) 05/10/2020  . Secondary hypercoagulable state ([place]) 04/23/2020  . Posterior vitreous detachment of right eye 12/02/2019  . Exudative age-related macular degeneration of left eye with active choroidal neovascularization ([place]) 10/28/2019  . Exudative age-related macular degeneration of right eye with active choroidal neovascularization ([place]) 10/28/2019  . Retinal hemorrhage of left eye 10/28/2019  . Malignant neoplasm of upper-outer quadrant of right breast in female, estrogen receptor positive ([place]) 03/26/2019  . Mild cognitive impairment 03/28/2018  . Foot pain, right 12/05/2017  . Hx of colonic polyps   . Diverticulosis of colon   . Anxiety   . Paroxysmal atrial fibrillation ([place]) 08/24/2016  . Low back pain 05/06/2015  . Osteopenia 08/04/2013  . Memory loss 09/17/2012  . 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 Macular degeneration (senile) of retina 08/12/2008  . Osteoarthritis 08/12/2008  . Impaired fasting glucose 02/10/2008  . Hypothyroidism 08/08/2007  . Dyslipidemia 08/08/2007  . Essential hypertension 08/08/2007    Past Surgical History:  Procedure Laterality Date  . APPENDECTOMY  1947  . BREAST LUMPECTOMY WITH RADIOACTIVE SEED LOCALIZATION Right 05/14/2019   Procedure: RIGHT BREAST LUMPECTOMY WITH RADIOACTIVE SEED LOCALIZATION;  Surgeon: [name], MD;  Location: [facility] OR;  Service: General;  Laterality: Right;  . CATARACT EXTRACTION W/PHACO Bilateral   . COLONOSCOPY  12/02/2014   per [name], severe diverticulosis but no polyps   . MASS EXCISION  04/17/2012   Procedure: EXCISION MASS;  Surgeon: [name], MD;  Location: [facility];  Service: General;  Laterality: N/A;  . THYROIDECTOMY  1990   [name]     OB History   No obstetric history on file.     Family History  Problem Relation Age of  Onset  . Stroke Mother   . Colon cancer Neg Hx   . Stomach cancer Neg Hx     Social History   Tobacco Use  . Smoking status: Former Smoker    Quit date: 07/10/1965    Years since quitting: 54.9  . Smokeless tobacco: Never Used  . Tobacco comment: 3 cigs per week x 3 years (started in 1964), 12/27/15 does not smoke  Vaping Use  . Vaping Use: Never used  Substance Use Topics  . Alcohol use: Yes    Alcohol/week: 3.0 standard drinks    Types: 3 Glasses of wine per week    Comment: occasionally  . Drug use: No    Home Medications Prior to Admission medications   Medication Sig Start Date End Date Taking? Authorizing Provider  anastrozole (ARIMIDEX) 1 MG tablet Take 1 tablet (1 mg total) by mouth daily. 05/10/20  Yes [name], [name], MD  Calcium Carbonate-Vitamin D (CALCIUM 600+D) 600-400 MG-UNIT per tablet Take 1 tablet by mouth daily.   Yes [provider]  Cyanocobalamin (VITAMIN B 12) 500 MCG TABS Take 1,000 mcg by mouth daily.    Yes [provider]  diltiazem (CARDIZEM) 30 MG tablet Take 1 tablet every 4 hours AS NEEDED for afib heart rate >100 as long as top BP >100. Patient taking differently: Take 30 mg by mouth every 4 (four) hours as needed (for afib heart rate >100 as long as top BP >100.).  04/23/20  Yes Fenton, Clint R, PA  donepezil (ARICEPT) 10 MG tablet TAKE ONE TABLET BY MOUTH EVERY NIGHT AT BEDTIME Patient taking differently: Take 10 mg by mouth at bedtime.  01/30/20  Yes [name], MD  fish oil-omega-3 fatty acids 1000 MG capsule Take 1 g by mouth daily.   Yes [provider]  GLUCOSAMINE-CHONDROITIN PO Take 1 tablet by mouth 3 (three) times daily.    Yes [provider]  losartan-hydrochlorothiazide (HYZAAR) 100-25 MG tablet Take 1 tablet by mouth daily. 10/17/19  Yes [name], MD  memantine (NAMENDA) 10 MG tablet TAKE ONE TABLET BY MOUTH TWICE A DAY Patient taking differently: Take 10 mg by mouth 2 (two) times daily.   03/29/20  Yes [name], MD  metoprolol succinate (TOPROL-XL) 100 MG 24 hr tablet Take 1 tablet (100 mg total) by mouth daily. Take with or immediately following a meal. 12/12/19  Yes [name], MD  Multiple Vitamins-Minerals (MULTIVITAMIN & MINERAL PO) Take 1 tablet by mouth daily.   Yes [provider]  warfarin (COUMADIN) 5 MG tablet Take as directed by [facility] Patient taking differently: Take 2.5 mg by mouth daily.  04/06/20  Yes [name], MD  fluorouracil (EFUDEX) 5 % cream Apply topically daily. Apply QHS x 3-4 weeks expect irritation. Patient not taking: Reported on 06/10/2020 03/11/20   Clark-Burning, [name], PA-C    Allergies    Epinephrine  Review of  Systems   Review of Systems  Constitutional: Negative for chills and fever.  HENT: Negative for congestion and rhinorrhea.   Eyes: Negative for redness and visual disturbance.  Respiratory: Negative for shortness of breath and wheezing.   Cardiovascular: Positive for syncope. Negative for chest pain and palpitations.  Gastrointestinal: Negative for nausea and vomiting.  Genitourinary: Negative for dysuria and urgency.  Musculoskeletal: Positive for arthralgias and myalgias.  Skin: Negative for pallor and wound.  Neurological: Positive for syncope. Negative for dizziness and headaches.    Physical Exam Updated Vital Signs BP (!) 128/55   Pulse (!) 49   Temp 98.9 F (37.2 C) (Oral)   Resp (!) 21   SpO2 96%   Physical Exam Vitals and nursing note reviewed.  Constitutional:      General: She is not in acute distress.    Appearance: She is well-developed. She is not diaphoretic.  HENT:     Head: Normocephalic and atraumatic.  Eyes:     Pupils: Pupils are equal, round, and reactive to light.  Cardiovascular:     Rate and Rhythm: Normal rate and regular rhythm.     Heart sounds: No murmur heard.  No friction rub. No gallop.   Pulmonary:     Effort: Pulmonary effort is normal.     Breath  sounds: No wheezing or rales.  Abdominal:     General: There is no distension.     Palpations: Abdomen is soft.     Tenderness: There is no abdominal tenderness.  Musculoskeletal:        General: Swelling and tenderness present.     Cervical back: Normal range of motion and neck supple.     Comments: Tenderness and swelling to the right ankle.  Crepitus noted along the medial aspect along the malleolus.  Pulse motor and sensation intact distally.  No pain at the knee.  Skin:    General: Skin is warm and dry.  Neurological:     Mental Status: She is alert and oriented to person, place, and time.  Psychiatric:        Behavior: Behavior normal.     ED Results / Procedures / Treatments   Labs (all labs ordered are listed, but only abnormal results are displayed) Labs Reviewed  CBC WITH DIFFERENTIAL/PLATELET - Abnormal; Notable for the following components:      Result Value   WBC 15.0 (*)    Neutro Abs 11.1 (*)    All other components within normal limits  BASIC METABOLIC PANEL - Abnormal; Notable for the following components:   Glucose, Bld 160 (*)    All other components within normal limits  RESP PANEL BY RT-PCR (FLU A&B, COVID) ARPGX2  PROTIME-INR  URINALYSIS, ROUTINE W REFLEX MICROSCOPIC  CBG MONITORING, ED    EKG None  Radiology DG Ankle Complete Right  Result Date: 06/10/2020 CLINICAL DATA:  Status post reduction EXAM: RIGHT ANKLE - COMPLETE 3+ VIEW COMPARISON:  06/10/2020 FINDINGS: Status post close reduction of bimalleolar fracture. The fracture fragments are in near anatomic alignment. Overlying splinting material noted. IMPRESSION: 1. Status post closed reduction of bimalleolar fracture of the right lower extremity 2. Fracture fragments are in near anatomic alignment. Electronically Signed   By: Kerby Moors M.D.   On: 06/10/2020 14:20   DG Ankle Complete Right  Result Date: 06/10/2020 CLINICAL DATA:  Right ankle pain after a fall. EXAM: RIGHT ANKLE - COMPLETE  3+ VIEW COMPARISON:  Foot radiographs dated 12/05/2017. FINDINGS: There is a  comminuted fracture of the distal fibula at or above the level of the tibiofibular syndesmosis. No definite widening of the distal tibiofibular articulation. There is a mildly displaced fracture of the medial malleolus with subluxation of the talar dome laterally. A nondisplaced posterior malleolus fracture is difficult to exclude. There is surrounding soft tissue swelling. Plantar and posterior calcaneal enthesophytes are noted. IMPRESSION: 1. Bimalleolar fracture with mild subluxation of the talar dome laterally. 2. Nondisplaced posterior malleolus fracture is difficult to exclude. Electronically Signed   By: Zerita Boers M.D.   On: 06/10/2020 11:25   CT Head Wo Contrast  Result Date: 06/10/2020 CLINICAL DATA:  Pain following fall.  Syncope. EXAM: CT HEAD WITHOUT CONTRAST TECHNIQUE: Contiguous axial images were obtained from the base of the skull through the vertex without intravenous contrast. COMPARISON:  None. FINDINGS: Brain: There is mild diffuse atrophy. There is no intracranial mass, hemorrhage, extra-axial fluid collection, or midline shift. There is mild small vessel disease in the centra semiovale bilaterally. Elsewhere brain parenchyma appears unremarkable. No acute infarct evident. Vascular: No hyperdense vessel. There is calcification in each carotid siphon region. Skull: Bony calvarium appears intact. Sinuses/Orbits: There is mucosal thickening in the left maxillary antrum. There is mucosal thickening in several ethmoid air cells. Orbits appear symmetric bilaterally. Other: Mastoid air cells are clear. IMPRESSION: Mild atrophy with mild periventricular small vessel disease. No acute infarct. No mass or hemorrhage. There are foci of arterial vascular calcification. There is mild mucosal thickening in several paranasal sinus regions. Electronically Signed   By: Lowella Grip III M.D.   On: 06/10/2020 11:48     Procedures Reduction of fracture  Date/Time: 06/10/2020 3:18 PM Performed by: Deno Etienne, DO Authorized by: Deno Etienne, DO  Preparation: Patient was prepped and draped in the usual sterile fashion. Local anesthesia used: yes Anesthesia: hematoma block  Anesthesia: Local anesthesia used: yes Local Anesthetic: lidocaine 2% with epinephrine Anesthetic total: 10 mL  Sedation: Patient sedated: no  Patient tolerance: patient tolerated the procedure well with no immediate complications    (including critical care time)  Medications Ordered in ED Medications  anastrozole (ARIMIDEX) tablet 1 mg (has no administration in time range)  memantine (NAMENDA) tablet 10 mg (has no administration in time range)  losartan-hydrochlorothiazide (HYZAAR) 100-25 MG per tablet 1 tablet (has no administration in time range)  HYDROcodone-acetaminophen (NORCO/VICODIN) 5-325 MG per tablet 1-2 tablet (has no administration in time range)  morphine 2 MG/ML injection 0.5 mg (has no administration in time range)  docusate sodium (COLACE) capsule 100 mg (has no administration in time range)  lidocaine-EPINEPHrine (XYLOCAINE W/EPI) 1 %-1:100000 (with pres) injection 10 mL (10 mLs Intradermal Given 06/10/20 1249)  fentaNYL (SUBLIMAZE) injection 75 mcg (75 mcg Intramuscular Given 06/10/20 1240)    ED Course  I have reviewed the triage vital signs and the nursing notes.  Pertinent labs & imaging results that were available during my care of the patient were reviewed by me and considered in my medical decision making (see chart for details).    MDM Rules/Calculators/A&P                          86 yo F with a chief complaint of right ankle pain after a fall.  The patient thinks that maybe she passed out this morning she does not remember exactly why she fell.  Heart rate is 46 , though it was 46 at her most recent cardiology visit.  Blood pressures  are mildly hypertensive.  She does not appear to be  symptomatic as she lays in the bed.  Plain film viewed by me with a laterally displaced ankle fracture, likely Weber C.  Will reduce at bedside.  Improved alignment still on my view of the x-ray post reduction.  There is still some residual lateral displacement and I discussed this with Hilbert Odor, orthopedics.  Down to evaluate at bedside.  With patient having a syncopal event without prodrome will discuss with the hospitalist for possible observation.  The patients results and plan were reviewed and discussed.   Any x-rays performed were independently reviewed by myself.   Differential diagnosis were considered with the presenting HPI.  Medications  anastrozole (ARIMIDEX) tablet 1 mg (has no administration in time range)  memantine (NAMENDA) tablet 10 mg (has no administration in time range)  losartan-hydrochlorothiazide (HYZAAR) 100-25 MG per tablet 1 tablet (has no administration in time range)  HYDROcodone-acetaminophen (NORCO/VICODIN) 5-325 MG per tablet 1-2 tablet (has no administration in time range)  morphine 2 MG/ML injection 0.5 mg (has no administration in time range)  docusate sodium (COLACE) capsule 100 mg (has no administration in time range)  lidocaine-EPINEPHrine (XYLOCAINE W/EPI) 1 %-1:100000 (with pres) injection 10 mL (10 mLs Intradermal Given 06/10/20 1249)  fentaNYL (SUBLIMAZE) injection 75 mcg (75 mcg Intramuscular Given 06/10/20 1240)    Vitals:   06/10/20 1345 06/10/20 1415 06/10/20 1445 06/10/20 1500  BP: (!) 128/55 (!) 151/62 (!) 143/56 (!) 128/55  Pulse: (!) 45 (!) 50 (!) 56 (!) 49  Resp: 15 15 16  (!) 21  Temp:      TempSrc:      SpO2: 97% 97% 98% 96%    Final diagnoses:  Closed bimalleolar fracture of right ankle, initial encounter    Admission/ observation were discussed with the admitting physician, patient and/or family and they are comfortable with the plan.   Final Clinical Impression(s) / ED Diagnoses Final diagnoses:  Closed bimalleolar  fracture of right ankle, initial encounter    Rx / DC Orders ED Discharge Orders    None       Deno Etienne, DO 06/10/20 1518

## 2020-06-10 NOTE — H&P (Signed)
History and Physical    Cynthia Mathis XLK:[id number] DOB: April 03, 1933 DOA: 06/10/2020  Referring MD/NP/PA: Deno Etienne, MD PCP: [name], MD   Consultants: Ottie Glazier, MD-cardiology Patient coming from: Home via EMS  Chief Complaint: Passed out  I have personally briefly reviewed patient's old medical records in [place]   HPI: Cynthia Mathis is a 86 y.o. female with medical history significant of hypertension, paroxysmal atrial fibrillation on Coumadin, diet-controlled diabetes mellitus type 2, and osteoporosis who presents after reportedly passing out at home.  Patient reports that she had gotten up early that morning and was talking with her houseguest in the kitchen.  She went and use the restroom and had a bowel movement.  The next next thing she recalls was waking up on the floor with pain on her right ankle.  She is unsure of how long she was out and does not know if she hit her head.  Denies fever, chills, lightheadedness, palpitations, chest pain, shortness of breath, leg swelling, diarrhea, nausea, vomiting, dysuria, urinary frequency new medications, or any changes to her medications.  At baseline she lives alone, is able to walk without assistance, and complete all of her ADLs.  She does have to have company over at the time and they called 911.  Patient notes that normally her heart rates are higher than the 40s.  ED Course: While in the emergency department patient was seen to be afebrile, pulse 45-56, blood pressure 122/52-150 8/60, and all other vital signs maintained.  COVID-19 screening was negative.  X-rays revealed a right bimalleolar fracture of the ankle.  Fracture was reduced by the ED provider.  Orthopedics was consulted.  TRH called to admit.  Review of Systems  Constitutional: Negative for fever and malaise/fatigue.  HENT: Negative for congestion and nosebleeds.   Eyes: Negative for photophobia and pain.  Respiratory: Negative for cough and  shortness of breath.   Cardiovascular: Negative for chest pain and leg swelling.  Gastrointestinal: Negative for abdominal pain, constipation, diarrhea and vomiting.  Genitourinary: Negative for dysuria and hematuria.  Musculoskeletal: Positive for falls and joint pain.  Skin: Negative for itching and rash.  Neurological: Positive for loss of consciousness.  Psychiatric/Behavioral: Negative for substance abuse. The patient does not have insomnia.     Past Medical History:  Diagnosis Date  . Anxiety   . Borderline diabetes mellitus   . Cancer ([place]) 2020   breast  . Diverticulosis of colon   . DJD (degenerative joint disease)   . Dyslipidemia   . History of hematuria   . History of osteoporosis   . Hx of colonic polyps   . Hypertension   . Hypothyroid   . Macular degeneration    sees [name] for general eye care, sees [name] for retinal injections   . Memory loss   . Osteoporosis    took bisphosphonates, now off. Last DEXA in 2015 showed osteopenia   . Paroxysmal atrial fibrillation (HCC)    sees [name]   . SCCA (squamous cell carcinoma) of skin 12/11/2019   Left Hand Posterior (in situ)    Past Surgical History:  Procedure Laterality Date  . APPENDECTOMY  1947  . BREAST LUMPECTOMY WITH RADIOACTIVE SEED LOCALIZATION Right 05/14/2019   Procedure: RIGHT BREAST LUMPECTOMY WITH RADIOACTIVE SEED LOCALIZATION;  Surgeon: [name], MD;  Location: [facility];  Service: General;  Laterality: Right;  . CATARACT EXTRACTION W/PHACO Bilateral   . COLONOSCOPY  12/02/2014   per  [name], severe diverticulosis but no polyps   . MASS EXCISION  04/17/2012   Procedure: EXCISION MASS;  Surgeon: [name], MD;  Location: [facility];  Service: General;  Laterality: N/A;  . THYROIDECTOMY  1990   [name]     reports that she quit smoking about 54 years ago. She has never used smokeless tobacco. She reports current alcohol use of about 3.0  standard drinks of alcohol per week. She reports that she does not use drugs.  Allergies  Allergen Reactions  . Epinephrine Palpitations    heart racing    Family History  Problem Relation Age of Onset  . Stroke Mother   . Colon cancer Neg Hx   . Stomach cancer Neg Hx     Prior to Admission medications   Medication Sig Start Date End Date Taking? Authorizing Provider  anastrozole (ARIMIDEX) 1 MG tablet Take 1 tablet (1 mg total) by mouth daily. 05/10/20  Yes [name], [name], MD  Calcium Carbonate-Vitamin D (CALCIUM 600+D) 600-400 MG-UNIT per tablet Take 1 tablet by mouth daily.   Yes [provider]  Cyanocobalamin (VITAMIN B 12) 500 MCG TABS Take 1,000 mcg by mouth daily.    Yes [provider]  diltiazem (CARDIZEM) 30 MG tablet Take 1 tablet every 4 hours AS NEEDED for afib heart rate >100 as long as top BP >100. Patient taking differently: Take 30 mg by mouth every 4 (four) hours as needed (for afib heart rate >100 as long as top BP >100.).  04/23/20  Yes Fenton, Clint R, PA  donepezil (ARICEPT) 10 MG tablet TAKE ONE TABLET BY MOUTH EVERY NIGHT AT BEDTIME Patient taking differently: Take 10 mg by mouth at bedtime.  01/30/20  Yes [name], MD  fish oil-omega-3 fatty acids 1000 MG capsule Take 1 g by mouth daily.   Yes [provider]  GLUCOSAMINE-CHONDROITIN PO Take 1 tablet by mouth 3 (three) times daily.    Yes [provider]  losartan-hydrochlorothiazide (HYZAAR) 100-25 MG tablet Take 1 tablet by mouth daily. 10/17/19  Yes [name], MD  memantine (NAMENDA) 10 MG tablet TAKE ONE TABLET BY MOUTH TWICE A DAY Patient taking differently: Take 10 mg by mouth 2 (two) times daily.  03/29/20  Yes [name], MD  metoprolol succinate (TOPROL-XL) 100 MG 24 hr tablet Take 1 tablet (100 mg total) by mouth daily. Take with or immediately following a meal. 12/12/19  Yes [name], MD  Multiple Vitamins-Minerals (MULTIVITAMIN & MINERAL PO) Take  1 tablet by mouth daily.   Yes [provider]  warfarin (COUMADIN) 5 MG tablet Take as directed by [facility] Patient taking differently: Take 2.5 mg by mouth daily.  04/06/20  Yes [name], MD  fluorouracil (EFUDEX) 5 % cream Apply topically daily. Apply QHS x 3-4 weeks expect irritation. Patient not taking: Reported on 06/10/2020 03/11/20   [name], PA-C    Physical Exam:  Constitutional: Elderly female currently NAD, calm, comfortable Vitals:   06/10/20 1306 06/10/20 1330 06/10/20 1345 06/10/20 1415  BP: (!) 150/73 (!) 127/57 (!) 128/55 (!) 151/62  Pulse: (!) 48 (!) 45 (!) 45 (!) 50  Resp: 19 13 15 15   Temp: 98.9 F (37.2 C)     TempSrc: Oral     SpO2: 98% 97% 97% 97%   Eyes: PERRL, lids and conjunctivae normal ENMT: Mucous membranes are moist. Posterior pharynx clear of any exudate or lesions.Normal dentition.  Neck: normal, supple, no masses, no thyromegaly Respiratory: clear to auscultation bilaterally, no wheezing, no crackles. Normal respiratory effort. No accessory muscle use.  Cardiovascular: Bradycardic, no murmurs / rubs / gallops. No extremity edema. 2+ pedal pulses. No carotid bruits.  Abdomen: no tenderness, no masses palpated. No hepatosplenomegaly. Bowel sounds positive.  Musculoskeletal: no clubbing / cyanosis.  Right ankle in Skin: no rashes, lesions, ulcers. No induration Neurologic: CN 2-12 grossly intact. Sensation intact, DTR normal. Strength 5/5 in all 4.  Psychiatric: Normal judgment and insight. Alert and oriented x 3. Normal mood.     Labs on Admission: I have personally reviewed following labs and imaging studies  CBC: Recent Labs  Lab 06/10/20 1303  WBC 15.0*  NEUTROABS 11.1*  HGB 14.6  HCT 41.9  MCV 92.3  PLT 329   Basic Metabolic Panel: Recent Labs  Lab 06/10/20 1303  NA 139  K 4.6  CL 101  CO2 23  GLUCOSE 160*  BUN 17  CREATININE 0.84  CALCIUM 9.4   GFR: CrCl cannot be calculated (Unknown  ideal weight.). Liver Function Tests: No results for input(s): AST, ALT, ALKPHOS, BILITOT, PROT, ALBUMIN in the last 168 hours. No results for input(s): LIPASE, AMYLASE in the last 168 hours. No results for input(s): AMMONIA in the last 168 hours. Coagulation Profile: No results for input(s): INR, PROTIME in the last 168 hours. Cardiac Enzymes: No results for input(s): CKTOTAL, CKMB, CKMBINDEX, TROPONINI in the last 168 hours. BNP (last 3 results) No results for input(s): PROBNP in the last 8760 hours. HbA1C: No results for input(s): HGBA1C in the last 72 hours. CBG: No results for input(s): GLUCAP in the last 168 hours. Lipid Profile: No results for input(s): CHOL, HDL, LDLCALC, TRIG, CHOLHDL, LDLDIRECT in the last 72 hours. Thyroid Function Tests: No results for input(s): TSH, T4TOTAL, FREET4, T3FREE, THYROIDAB in the last 72 hours. Anemia Panel: No results for input(s): VITAMINB12, FOLATE, FERRITIN, TIBC, IRON, RETICCTPCT in the last 72 hours. Urine analysis:    Component Value Date/Time   COLORURINE LT YELLOW 11/12/2007 0945   APPEARANCEUR Clear 11/12/2007 0945   LABSPEC < OR = 1.005 11/12/2007 0945   PHURINE 5.5 11/12/2007 0945   GLUCOSEU NEGATIVE 11/12/2007 0945   BILIRUBINUR negative 02/12/2019 1056   KETONESUR NEGATIVE 11/12/2007 0945   PROTEINUR Negative 02/12/2019 1056   UROBILINOGEN 0.2 02/12/2019 1056   UROBILINOGEN 0.2 mg/dL 11/12/2007 0945   NITRITE negative 02/12/2019 1056   NITRITE Negative 11/12/2007 0945   LEUKOCYTESUR Negative 02/12/2019 1056   Sepsis Labs: No results found for this or any previous visit (from the past 240 hour(s)).   Radiological Exams on Admission: DG Ankle Complete Right  Result Date: 06/10/2020 CLINICAL DATA:  Status post reduction EXAM: RIGHT ANKLE - COMPLETE 3+ VIEW COMPARISON:  06/10/2020 FINDINGS: Status post close reduction of bimalleolar fracture. The fracture fragments are in near anatomic alignment. Overlying splinting  material noted. IMPRESSION: 1. Status post closed reduction of bimalleolar fracture of the right lower extremity 2. Fracture fragments are in near anatomic alignment. Electronically Signed   By: Kerby Moors M.D.   On: 06/10/2020 14:20   DG Ankle Complete Right  Result Date: 06/10/2020 CLINICAL DATA:  Right ankle pain after a fall. EXAM: RIGHT ANKLE - COMPLETE 3+ VIEW COMPARISON:  Foot radiographs dated 12/05/2017. FINDINGS: There is a comminuted fracture of the distal fibula at or above the level of the tibiofibular syndesmosis. No definite widening of the distal tibiofibular articulation. There is a mildly displaced fracture  of the medial malleolus with subluxation of the talar dome laterally. A nondisplaced posterior malleolus fracture is difficult to exclude. There is surrounding soft tissue swelling. Plantar and posterior calcaneal enthesophytes are noted. IMPRESSION: 1. Bimalleolar fracture with mild subluxation of the talar dome laterally. 2. Nondisplaced posterior malleolus fracture is difficult to exclude. Electronically Signed   By: Zerita Boers M.D.   On: 06/10/2020 11:25   CT Head Wo Contrast  Result Date: 06/10/2020 CLINICAL DATA:  Pain following fall.  Syncope. EXAM: CT HEAD WITHOUT CONTRAST TECHNIQUE: Contiguous axial images were obtained from the base of the skull through the vertex without intravenous contrast. COMPARISON:  None. FINDINGS: Brain: There is mild diffuse atrophy. There is no intracranial mass, hemorrhage, extra-axial fluid collection, or midline shift. There is mild small vessel disease in the centra semiovale bilaterally. Elsewhere brain parenchyma appears unremarkable. No acute infarct evident. Vascular: No hyperdense vessel. There is calcification in each carotid siphon region. Skull: Bony calvarium appears intact. Sinuses/Orbits: There is mucosal thickening in the left maxillary antrum. There is mucosal thickening in several ethmoid air cells. Orbits appear symmetric  bilaterally. Other: Mastoid air cells are clear. IMPRESSION: Mild atrophy with mild periventricular small vessel disease. No acute infarct. No mass or hemorrhage. There are foci of arterial vascular calcification. There is mild mucosal thickening in several paranasal sinus regions. Electronically Signed   By: Lowella Grip III M.D.   On: 06/10/2020 11:48    EKG: Independently reviewed.  Sinus bradycardia at 45 bpm  Assessment/Plan  Right bimalleolar ankle fracture secondary to fall/syncopal event: Acute.  Patient presents after reportedly passing out.  Found to have a bimalleolar fracture of the right foot.  Orthopedics formally consulted. -Admit to a medical telemetry bed  -Hip fracture order set utilized -Hydrocodone/morphine as needed moderate to severe pain -Follow-up CT scan of the -Appreciate orthopedic consultative services follow-up for further recommendations  Syncope bradycardia: Patient reportedly had gone to the bathroom this morning and the next thing she recalls is waking up on the floor.  Denied any prodromal symptoms. Patient's heart rate is noted to be in the 40s on admission.  Last echocardiogram revealed EF of 60-65% with grade 1 diastolic dysfunction and mild aortic/tricuspid regurgitation in 2018.  Question static hypotension versus possible arrhythmia. -Add-on TSH -Hold metoprolol -Follow-up telemetry -Cardiology consulted, given history of suspected syncopal event with bradycardia.  Leukocytosis: Acute.  WBC elevated at 15.  Suspect this is related to patient's acute fracture and/or UTI. -Recheck WBC in a.m.  Urinary tract infection: Acute.  Patient had denied any urinary symptoms, but clearly appears to have a urinary tract infection.  Urinalysis was positive for leukocytes, positive nitrites, many bacteria, and 21-50 WBCs. -Check urine culture -Rocephin IV  Paroxysmal atrial fibrillation on chronic anticoagulation:CHA2DS2-VASc score =5.  Patient is on Coumadin  at home.  Followed by Dr. Meda Coffee of cardiology in outpatient setting. -Check INR goal is less than 1.5 to go to surgery -Hold Coumadin -Heparin drip bridge due to elevated CHA2DS2-VASc score  Essential hypertension: Home blood pressure medications include Hyzaar 1 tablet daily, metoprolol 100 mg daily, and diltiazem as needed for irregular heart beat if systolic blood pressure greater than 100.  -Hold metoprolol -Continue Hyzaar  Diabetes mellitus type 2:  On admission glucose mildly elevated at 160.  Last available hemoglobin A1c noted to be 6.6 on 02/12/2019.  Patient is not on any diabetic medications. -Hypoglycemic protocol -Carb modified diet -HbgA1c   Mild cognitive impairment: Patient has mild issues with  her memory at baseline.  Home medications include Aricept 10 mg nightly and Namenda. -Held Aricept with bradycardia  History of breast cancer -Continue anastratzole    History of hypothyroidism -Check TSH  Osteopenia: As per last DEXA scan done in 2015   DVT prophylaxis: Heparin Code Status: full Family Communication: son  Disposition Plan: Likely would be discharged to a rehab facility Consults called: Orthopedics Admission status: Inpatient requiring more than 2 midnight stay in need of surgical correction  Norval Morton MD [facility] Pager [phone]   If 7PM-7AM, please contact night-coverage [email or web address] Password [facility]  06/10/2020, 2:54 PM

## 2020-06-10 NOTE — ED Triage Notes (Signed)
Pt BIB [facility] from home. Pt had a syncopal event while using the restroom this morning. Pt was bradycardic with EMS with HR in 40s. Pt did not take any of her medications this morning. Pt also has injury to right ankle, she Is not sure how it occurred.

## 2020-06-10 NOTE — Consult Note (Signed)
Reason for Consult:Right ankle fx Referring Physician: Ellin Mayhew Time called: [id number] Time at bedside: [name] is an 86 y.o. female.  HPI: Cynthia Mathis was leaving her bathroom when she suffered a syncopal event. She denies prodrome, just woke up on the floor. She had right ankle pain and could not bear weight. She was brought to the ED where x-rays showed an ankle fx and orthopedic surgery was consulted. She was reduced by the EDP. She lives at home alone and does not usually use any assistive devices for ambulation.  Past Medical History:  Diagnosis Date   Anxiety    Borderline diabetes mellitus    Cancer ([place]) 2020   breast   Diverticulosis of colon    DJD (degenerative joint disease)    Dyslipidemia    History of hematuria    History of osteoporosis    Hx of colonic polyps    Hypertension    Hypothyroid    Macular degeneration    sees [name] for general eye care, sees [name] for retinal injections    Memory loss    Osteoporosis    took bisphosphonates, now off. Last DEXA in 2015 showed osteopenia    Paroxysmal atrial fibrillation ([place])    sees [name]    SCCA (squamous cell carcinoma) of skin 12/11/2019   Left Hand Posterior (in situ)    Past Surgical History:  Procedure Laterality Date   APPENDECTOMY  1947   BREAST LUMPECTOMY WITH RADIOACTIVE SEED LOCALIZATION Right 05/14/2019   Procedure: RIGHT BREAST LUMPECTOMY WITH RADIOACTIVE SEED LOCALIZATION;  Surgeon: [name], MD;  Location: [facility];  Service: General;  Laterality: Right;   CATARACT EXTRACTION W/PHACO Bilateral    COLONOSCOPY  12/02/2014   per [name], severe diverticulosis but no polyps    MASS EXCISION  04/17/2012   Procedure: EXCISION MASS;  Surgeon: [name], MD;  Location: [facility];  Service: General;  Laterality: N/A;   THYROIDECTOMY  1990   [name]    Family History  Problem Relation Age of Onset   Stroke  Mother    Colon cancer Neg Hx    Stomach cancer Neg Hx     Social History:  reports that she quit smoking about 54 years ago. She has never used smokeless tobacco. She reports current alcohol use of about 3.0 standard drinks of alcohol per week. She reports that she does not use drugs.  Allergies:  Allergies  Allergen Reactions   Epinephrine Palpitations    heart racing    Medications: I have reviewed the patient's current medications.  Results for orders placed or performed during the hospital encounter of 06/10/20 (from the past 48 hour(s))  CBC with Differential     Status: Abnormal   Collection Time: 06/10/20  1:03 PM  Result Value Ref Range   WBC 15.0 (H) 4.0 - 10.5 K/uL   RBC 4.54 3.87 - 5.11 MIL/uL   Hemoglobin 14.6 12.0 - 15.0 g/dL   HCT 41.9 36 - 46 %   MCV 92.3 80.0 - 100.0 fL   MCH 32.2 26.0 - 34.0 pg   MCHC 34.8 30.0 - 36.0 g/dL   RDW 12.5 11.5 - 15.5 %   Platelets 287 150 - 400 K/uL   nRBC 0.0 0.0 - 0.2 %   Neutrophils Relative % 73 %   Neutro Abs 11.1 (H) 1.7 - 7.7 K/uL   Lymphocytes Relative 18 %   Lymphs Abs 2.7  0.7 - 4.0 K/uL   Monocytes Relative 7 %   Monocytes Absolute 1.0 0.1 - 1.0 K/uL   Eosinophils Relative 0 %   Eosinophils Absolute 0.1 0.0 - 0.5 K/uL   Basophils Relative 1 %   Basophils Absolute 0.1 0.0 - 0.1 K/uL   Immature Granulocytes 1 %   Abs Immature Granulocytes 0.07 0.00 - 0.07 K/uL    Comment: Performed at [facility] [address], [place] [address]  Basic metabolic panel     Status: Abnormal   Collection Time: 06/10/20  1:03 PM  Result Value Ref Range   Sodium 139 135 - 145 mmol/L   Potassium 4.6 3.5 - 5.1 mmol/L    Comment: SPECIMEN HEMOLYZED. HEMOLYSIS MAY AFFECT INTEGRITY OF RESULTS.   Chloride 101 98 - 111 mmol/L   CO2 23 22 - 32 mmol/L   Glucose, Bld 160 (H) 70 - 99 mg/dL    Comment: Glucose reference range applies only to samples taken after fasting for at least 8 hours.   BUN 17 8 - 23 mg/dL    Creatinine, Ser 0.84 0.44 - 1.00 mg/dL   Calcium 9.4 8.9 - 10.3 mg/dL   GFR, Estimated >60 >60 mL/min    Comment: (NOTE) Calculated using the CKD-EPI Creatinine Equation (2021)    Anion gap 15 5 - 15    Comment: Performed at [facility] [address] [place], [place] [address]    DG Ankle Complete Right  Result Date: 06/10/2020 CLINICAL DATA:  Status post reduction EXAM: RIGHT ANKLE - COMPLETE 3+ VIEW COMPARISON:  06/10/2020 FINDINGS: Status post close reduction of bimalleolar fracture. The fracture fragments are in near anatomic alignment. Overlying splinting material noted. IMPRESSION: 1. Status post closed reduction of bimalleolar fracture of the right lower extremity 2. Fracture fragments are in near anatomic alignment. Electronically Signed   By: Kerby Moors M.D.   On: 06/10/2020 14:20   DG Ankle Complete Right  Result Date: 06/10/2020 CLINICAL DATA:  Right ankle pain after a fall. EXAM: RIGHT ANKLE - COMPLETE 3+ VIEW COMPARISON:  Foot radiographs dated 12/05/2017. FINDINGS: There is a comminuted fracture of the distal fibula at or above the level of the tibiofibular syndesmosis. No definite widening of the distal tibiofibular articulation. There is a mildly displaced fracture of the medial malleolus with subluxation of the talar dome laterally. A nondisplaced posterior malleolus fracture is difficult to exclude. There is surrounding soft tissue swelling. Plantar and posterior calcaneal enthesophytes are noted. IMPRESSION: 1. Bimalleolar fracture with mild subluxation of the talar dome laterally. 2. Nondisplaced posterior malleolus fracture is difficult to exclude. Electronically Signed   By: Zerita Boers M.D.   On: 06/10/2020 11:25   CT Head Wo Contrast  Result Date: 06/10/2020 CLINICAL DATA:  Pain following fall.  Syncope. EXAM: CT HEAD WITHOUT CONTRAST TECHNIQUE: Contiguous axial images were obtained from the base of the skull through the vertex without intravenous contrast.  COMPARISON:  None. FINDINGS: Brain: There is mild diffuse atrophy. There is no intracranial mass, hemorrhage, extra-axial fluid collection, or midline shift. There is mild small vessel disease in the centra semiovale bilaterally. Elsewhere brain parenchyma appears unremarkable. No acute infarct evident. Vascular: No hyperdense vessel. There is calcification in each carotid siphon region. Skull: Bony calvarium appears intact. Sinuses/Orbits: There is mucosal thickening in the left maxillary antrum. There is mucosal thickening in several ethmoid air cells. Orbits appear symmetric bilaterally. Other: Mastoid air cells are clear. IMPRESSION: Mild atrophy with mild  periventricular small vessel disease. No acute infarct. No mass or hemorrhage. There are foci of arterial vascular calcification. There is mild mucosal thickening in several paranasal sinus regions. Electronically Signed   By: Lowella Grip III M.D.   On: 06/10/2020 11:48    Review of Systems  HENT: Negative for ear discharge, ear pain, hearing loss and tinnitus.   Eyes: Negative for photophobia and pain.  Respiratory: Negative for cough and shortness of breath.   Cardiovascular: Negative for chest pain.  Gastrointestinal: Negative for abdominal pain, nausea and vomiting.  Genitourinary: Negative for dysuria, flank pain, frequency and urgency.  Musculoskeletal: Positive for arthralgias (Right ankle). Negative for back pain, myalgias and neck pain.  Neurological: Negative for dizziness and headaches.  Hematological: Does not bruise/bleed easily.  Psychiatric/Behavioral: The patient is not nervous/anxious.    Blood pressure (!) 151/62, pulse (!) 50, temperature 98.9 F (37.2 C), temperature source Oral, resp. rate 15, SpO2 97 %. Physical Exam Constitutional:      General: She is not in acute distress.    Appearance: She is well-developed. She is not diaphoretic.  HENT:     Head: Normocephalic and atraumatic.  Eyes:     General: No  scleral icterus.       Right eye: No discharge.        Left eye: No discharge.     Conjunctiva/sclera: Conjunctivae normal.  Cardiovascular:     Rate and Rhythm: Normal rate and regular rhythm.  Pulmonary:     Effort: Pulmonary effort is normal. No respiratory distress.  Musculoskeletal:     Cervical back: Normal range of motion.     Comments: RLE No traumatic wounds, ecchymosis, or rash  Short leg splint in place  No knee effusion  Knee stable to varus/ valgus and anterior/posterior stress  Sens DPN, SPN, TN grossly intact  Motor EHL 5/5  Toes perfused, No significant edema  Skin:    General: Skin is warm and dry.  Neurological:     Mental Status: She is alert.  Psychiatric:        Behavior: Behavior normal.     Assessment/Plan: Right ankle fx -- Plan ORIF by Dr. Erlinda Hong, likely tomorrow. Will get CT to better characterize fx. Multiple medical problems including mild dementia, afib on coumadin, and HTN -- IM to admit, manage, and clear as well as workup syncope.    Lisette Abu, PA-C Orthopedic Surgery [phone] 06/10/2020, 2:52 PM

## 2020-06-10 NOTE — Consult Note (Signed)
Cardiology Consultation:   Patient ID: Cynthia Mathis MRN: [id number]; DOB: 06-05-1933  Admit date: 06/10/2020 Date of Consult: 06/10/2020  Primary Care Provider: [name], MD [facility] Cardiologist: Cynthia Dawley, MD  [facility] Electrophysiologist:  None Afib clinic   Patient Profile:   Cynthia Mathis is a 86 y.o. female with a hx of DM, HLD, HTN, and paroxysmal atrial fibrillation, breast cancer who is being seen today for the evaluation of syncope and bradycardia at the request of Cynthia Mathis.  History of Present Illness:   Cynthia Mathis is a pleasant 86 year old female with diabetes, hypertension, hyperlipidemia, paroxysmal atrial fibrillation, breast cancer, who presents to the hospital today after a syncopal episode after having a bowel movement and standing up at home, and sustaining a right ankle fracture with Mathis for nonemergent surgical management.  Orthopedic service is involved and requests preoperative cardiovascular evaluation.  She follows regularly with cardiology and was most recently seen by the atrial fibrillation clinic [facility], [place] 04/23/2020.  She is noted to have paroxysmal atrial fibrillation with symptomatic palpitations and dizziness when in A. fib, initially diagnosed in 2017.  She is on warfarin for a CHA2DS2-VASc or of 5.  She had an episode of palpitations and shortness of breath in October and was noted to be in A. fib RVR.  She was given diltiazem and converted to sinus rhythm in route.  She is maintained on Toprol XL 100 mg daily, and was given a as needed prescription for diltiazem 30 mg every 4 as needed for heart racing at that visit.  Her son who was present at the bedside for my exam tells me she has not taken any of this diltiazem since that visit.  She was noted to be bradycardic at her A. fib clinic visit in October and was felt to be asymptomatic with a heart rate of 46 bpm.  She was at home and had a bowel movement.  She  stood up from the toilet and began to try to leave the bathroom.  She does not recall a prodrome of symptoms but woke up on the floor.  She does not know why she fell and suspects she had a syncopal episode.  She was unsure if she hit her head, CT head performed in the ED was negative for bleed.  She denies any episodes of syncope, dizziness, lightheadedness on a day-to-day basis.  Denies lifetime syncope.  She denies positional dizziness or lightheadedness.  She is not as active anymore as she used to be and her son tells me she used to play pickle ball 5 days a week just 3 to 4 years ago.  They cannot clearly identify what caused her to slow down.  She denies chest pain, shortness of breath, palpitations.  Denies snoring.  Telemetry demonstrates sinus bradycardia with a rate of approximately 50 bpm. ECG demonstrates marked sinus bradycardia with a rate of 45, and T wave abnormality.  As compared to prior ECGs, she has had borderline T wave abnormalities previously that are more pronounced today.  Pertinent laboratory studies include sodium 139, potassium 4.6, creatinine 0.84, hemoglobin 14.6, platelet count 287,000, WBC 15,000, INR 2.4, hemoglobin A1c 7.4, TSH 0.31.  Chest x-ray unremarkable. Echocardiogram March 2018 EF 60 to 65%, normal wall motion, grade 1 diastolic dysfunction, mild aortic valve regurgitation, mild tricuspid valve regurgitation, normal pulmonary artery systolic pressure.    Past Medical History:  Diagnosis Date  . Anxiety   . Borderline diabetes mellitus   .  Cancer ([place]) 2020   breast  . Diverticulosis of colon   . DJD (degenerative joint disease)   . Dyslipidemia   . History of hematuria   . History of osteoporosis   . Hx of colonic polyps   . Hypertension   . Hypothyroid   . Macular degeneration    sees Dr. Herbert Mathis for general eye care, sees Dr. Zadie Mathis for retinal injections   . Memory loss   . Osteoporosis    took bisphosphonates, now off. Last DEXA in 2015  showed osteopenia   . Paroxysmal atrial fibrillation (HCC)    sees Dr. Ena Mathis   . SCCA (squamous cell carcinoma) of skin 12/11/2019   Left Hand Posterior (in situ)    Past Surgical History:  Procedure Laterality Date  . APPENDECTOMY  1947  . BREAST LUMPECTOMY WITH RADIOACTIVE SEED LOCALIZATION Right 05/14/2019   Procedure: RIGHT BREAST LUMPECTOMY WITH RADIOACTIVE SEED LOCALIZATION;  Surgeon: Cynthia Kussmaul, MD;  Location: [place];  Service: General;  Laterality: Right;  . CATARACT EXTRACTION W/PHACO Bilateral   . COLONOSCOPY  12/02/2014   per Dr. Henrene Mathis, severe diverticulosis but no polyps   . MASS EXCISION  04/17/2012   Procedure: EXCISION MASS;  Surgeon: Cynthia Faster. Cornett, MD;  Location: [place];  Service: General;  Laterality: N/A;  . THYROIDECTOMY  1990   Dr. Rise Mathis     Home Medications:  Prior to Admission medications   Medication Sig Start Date End Date Taking? Authorizing Provider  anastrozole (ARIMIDEX) 1 MG tablet Take 1 tablet (1 mg total) by mouth daily. 05/10/20  Yes [name], [name], MD  Calcium Carbonate-Vitamin D (CALCIUM 600+D) 600-400 MG-UNIT per tablet Take 1 tablet by mouth daily.   Yes [provider]  Cyanocobalamin (VITAMIN B 12) 500 MCG TABS Take 1,000 mcg by mouth daily.    Yes [provider]  diltiazem (CARDIZEM) 30 MG tablet Take 1 tablet every 4 hours AS NEEDED for afib heart rate >100 as long as top BP >100. Patient taking differently: Take 30 mg by mouth every 4 (four) hours as needed (for afib heart rate >100 as long as top BP >100.).  04/23/20  Yes Cynthia Mathis, Cynthia Mathis  donepezil (ARICEPT) 10 MG tablet TAKE ONE TABLET BY MOUTH EVERY NIGHT AT BEDTIME Patient taking differently: Take 10 mg by mouth at bedtime.  01/30/20  Yes Cynthia Morale, MD  fish oil-omega-3 fatty acids 1000 MG capsule Take 1 g by mouth daily.   Yes [provider]  GLUCOSAMINE-CHONDROITIN PO Take 1 tablet by mouth 3 (three) times  daily.    Yes [provider]  losartan-hydrochlorothiazide (HYZAAR) 100-25 MG tablet Take 1 tablet by mouth daily. 10/17/19  Yes Cynthia Morale, MD  memantine (NAMENDA) 10 MG tablet TAKE ONE TABLET BY MOUTH TWICE A DAY Patient taking differently: Take 10 mg by mouth 2 (two) times daily.  03/29/20  Yes Cynthia Morale, MD  metoprolol succinate (TOPROL-XL) 100 MG 24 hr tablet Take 1 tablet (100 mg total) by mouth daily. Take with or immediately following a meal. 12/12/19  Yes Cynthia Morale, MD  Multiple Vitamins-Minerals (MULTIVITAMIN & MINERAL PO) Take 1 tablet by mouth daily.   Yes [provider]  warfarin (COUMADIN) 5 MG tablet Take as directed by Coumadin Clinic Patient taking differently: Take 2.5 mg by mouth daily.  04/06/20  Yes [name], MD  fluorouracil (EFUDEX) 5 % cream Apply topically daily. Apply QHS x 3-4  weeks expect irritation. Patient not taking: Reported on 06/10/2020 03/11/20   [name], Mathis-C    Inpatient Medications: Scheduled Meds: . anastrozole  1 mg Oral Daily  . docusate sodium  100 mg Oral BID  . hydrochlorothiazide  25 mg Oral Daily  . losartan  100 mg Oral Daily  . memantine  10 mg Oral BID   Continuous Infusions: . cefTRIAXone (ROCEPHIN)  IV Stopped (06/10/20 2028)   PRN Meds: HYDROcodone-acetaminophen, morphine injection  Allergies:    Allergies  Allergen Reactions  . Epinephrine Palpitations    heart racing    Social History:   Social History   Socioeconomic History  . Marital status: Widowed    Spouse name: Mariane Masters Kerchner  . Number of children: 3  . Years of education: [employer]  . Highest education level: Not on file  Occupational History  . Occupation: Education officer, environmental: RETIRED  Tobacco Use  . Smoking status: Former Smoker    Quit date: 07/10/1965    Years since quitting: 54.9  . Smokeless tobacco: Never Used  . Tobacco comment: 3 cigs per week x 3 years (started in 1964), 12/27/15 does not smoke   Vaping Use  . Vaping Use: Never used  Substance and Sexual Activity  . Alcohol use: Yes    Alcohol/week: 3.0 standard drinks    Types: 3 Glasses of wine per week    Comment: occasionally  . Drug use: No  . Sexual activity: Not on file  Other Topics Concern  . Not on file  Social History Narrative   03/26/19 Pt lives at home alone now, widow.   her spouse passed Dec 2019 Mariane Masters Oberman, who has been dx'd with mild cognitive impairment vs. mild dementia, former pt of [name]). She does not use caffeine, if so, rarely.   Social Determinants of Health   Financial Resource Strain: Low Risk   . Difficulty of Paying Living Expenses: Not hard at all  Food Insecurity:   . Worried About Charity fundraiser in the Last Year: Not on file  . Ran Out of Food in the Last Year: Not on file  Transportation Needs: No Transportation Needs  . Lack of Transportation (Medical): No  . Lack of Transportation (Non-Medical): No  Physical Activity:   . Days of Exercise per Week: Not on file  . Minutes of Exercise per Session: Not on file  Stress:   . Feeling of Stress : Not on file  Social Connections:   . Frequency of Communication with Friends and Family: Not on file  . Frequency of Social Gatherings with Friends and Family: Not on file  . Attends Religious Services: Not on file  . Active Member of Clubs or Organizations: Not on file  . Attends Archivist Meetings: Not on file  . Marital Status: Not on file  Intimate Partner Violence:   . Fear of Current or Ex-Partner: Not on file  . Emotionally Abused: Not on file  . Physically Abused: Not on file  . Sexually Abused: Not on file    Family History:    Family History  Problem Relation Age of Onset  . Stroke Mother   . Colon cancer Neg Hx   . Stomach cancer Neg Hx      ROS:  Please see the history of present illness.   All other ROS reviewed and negative.     Physical Exam/Data:   Vitals:   06/10/20 1900 06/10/20 1915  06/10/20  1930 06/10/20 1955  BP:    (!) 140/45  Pulse: (!) 54 (!) 51 (!) 51 (!) 52  Resp: 17 18 18 16   Temp:      TempSrc:      SpO2: 98% 97% 96% 96%    Intake/Output Summary (Last 24 hours) at 06/10/2020 2048 Last data filed at 06/10/2020 2028 Gross per 24 hour  Intake 100 ml  Output --  Net 100 ml   Last 3 Weights 05/10/2020 04/23/2020 04/19/2020  Weight (lbs) 146 lb 4.8 oz 146 lb 6.4 oz 141 lb 1.5 oz  Weight (kg) 66.361 kg 66.407 kg 64 kg     There is no height or weight on file to calculate BMI.  General:  Well nourished, well developed, in no acute distress HEENT: normal Neck: no JVD Cardiac: Regular rhythm, bradycardic, no significant murmurs. Lungs:  clear to auscultation bilaterally, no wheezing, rhonchi or rales, auscultated anteriorly Abd: soft, nontender, no hepatomegaly  Ext: no edema Musculoskeletal: Right lower extremity in cast. Skin: warm and dry  Neuro: Nonfocal Psych:  Normal affect   EKG:  The EKG was personally reviewed and demonstrates: Sinus bradycardia, rate 45, T wave abnormality Telemetry:  Telemetry was personally reviewed and demonstrates: Sinus bradycardia, rate 50  Relevant CV Studies: Repeat echocardiogram pending  Laboratory Data:  High Sensitivity Troponin:  No results for input(s): TROPONINIHS in the last 720 hours.   Chemistry Recent Labs  Lab 06/10/20 1303  NA 139  K 4.6  CL 101  CO2 23  GLUCOSE 160*  BUN 17  CREATININE 0.84  CALCIUM 9.4  GFRNONAA >60  ANIONGAP 15    No results for input(s): PROT, ALBUMIN, AST, ALT, ALKPHOS, BILITOT in the last 168 hours. Hematology Recent Labs  Lab 06/10/20 1303  WBC 15.0*  RBC 4.54  HGB 14.6  HCT 41.9  MCV 92.3  MCH 32.2  MCHC 34.8  RDW 12.5  PLT 287   BNPNo results for input(s): BNP, PROBNP in the last 168 hours.  DDimer No results for input(s): DDIMER in the last 168 hours.   Radiology/Studies:  DG Ankle Complete Right  Result Date: 06/10/2020 CLINICAL DATA:  Status  post reduction EXAM: RIGHT ANKLE - COMPLETE 3+ VIEW COMPARISON:  06/10/2020 FINDINGS: Status post close reduction of bimalleolar fracture. The fracture fragments are in near anatomic alignment. Overlying splinting material noted. IMPRESSION: 1. Status post closed reduction of bimalleolar fracture of the right lower extremity 2. Fracture fragments are in near anatomic alignment. Electronically Signed   By: Kerby Moors M.D.   On: 06/10/2020 14:20   DG Ankle Complete Right  Result Date: 06/10/2020 CLINICAL DATA:  Right ankle pain after a fall. EXAM: RIGHT ANKLE - COMPLETE 3+ VIEW COMPARISON:  Foot radiographs dated 12/05/2017. FINDINGS: There is a comminuted fracture of the distal fibula at or above the level of the tibiofibular syndesmosis. No definite widening of the distal tibiofibular articulation. There is a mildly displaced fracture of the medial malleolus with subluxation of the talar dome laterally. A nondisplaced posterior malleolus fracture is difficult to exclude. There is surrounding soft tissue swelling. Plantar and posterior calcaneal enthesophytes are noted. IMPRESSION: 1. Bimalleolar fracture with mild subluxation of the talar dome laterally. 2. Nondisplaced posterior malleolus fracture is difficult to exclude. Electronically Signed   By: Zerita Boers M.D.   On: 06/10/2020 11:25   CT Head Wo Contrast  Result Date: 06/10/2020 CLINICAL DATA:  Pain following fall.  Syncope. EXAM: CT HEAD WITHOUT CONTRAST TECHNIQUE: Contiguous axial  images were obtained from the base of the skull through the vertex without intravenous contrast. COMPARISON:  None. FINDINGS: Brain: There is mild diffuse atrophy. There is no intracranial mass, hemorrhage, extra-axial fluid collection, or midline shift. There is mild small vessel disease in the centra semiovale bilaterally. Elsewhere brain parenchyma appears unremarkable. No acute infarct evident. Vascular: No hyperdense vessel. There is calcification in each  carotid siphon region. Skull: Bony calvarium appears intact. Sinuses/Orbits: There is mucosal thickening in the left maxillary antrum. There is mucosal thickening in several ethmoid air cells. Orbits appear symmetric bilaterally. Other: Mastoid air cells are clear. IMPRESSION: Mild atrophy with mild periventricular small vessel disease. No acute infarct. No mass or hemorrhage. There are foci of arterial vascular calcification. There is mild mucosal thickening in several paranasal sinus regions. Electronically Signed   By: Lowella Grip III M.D.   On: 06/10/2020 11:48   CT ANKLE RIGHT WO CONTRAST  Result Date: 06/10/2020 CLINICAL DATA:  The patient suffered right ankle fractures in a fall today. Initial encounter. EXAM: CT OF THE RIGHT ANKLE WITHOUT CONTRAST TECHNIQUE: Multidetector CT imaging of the right ankle was performed according to the standard protocol. Multiplanar CT image reconstructions were also generated. COMPARISON:  Plain films right ankle earlier today. FINDINGS: Bones/Joint/Cartilage Acute fracture of the posterior malleolus is seen. The fracture fragment is triangular in shape with the base of the triangle adjacent to the fibula. Fracture fragment measures up to 0.7 cm AP by 2.3 cm transverse at the plafond by 2 cm craniocaudal. The fracture fragment demonstrates slight posterior and lateral displacement. Also seen is a fracture of the medial malleolus. The distal fragment is inferiorly displaced approximately 0.6 cm and demonstrates slight medial displacement. The patient has an acute diaphyseal fracture of the distal fibula. The fracture originates approximately 6.5 cm above the tip of the lateral malleolus. The fracture is mildly comminuted with 3 fragments identified. There is slight fragment override and mild lateral angulation of the distal fragment. Also seen is an avulsion fracture off of the anterior aspect of the lateral malleolus where a fracture fragment measuring approximately  0.6 cm in diameter is anteriorly displaced approximately 0.3 cm. Ligaments Suboptimally assessed by CT. Avulsion fracture off the anterior aspect of the lateral malleolus is at the expected attachment site of the anterior, inferior tibiofibular ligament. As visualized by CT scan, ligamentous structures about the ankle are otherwise unremarkable. Muscles and Tendons Appear intact.  No tendon entrapment. Soft tissues Soft tissue contusion and hematoma about the patient's fractures noted. IMPRESSION: Ankle fractures most consistent with a Weber C4 injury. Although the anterior inferior tibiofibular ligament is likely intact, there is an avulsion fracture at its attachment site to the fibula. Electronically Signed   By: Inge Cynthia M.D.   On: 06/10/2020 17:36   Chest Portable 1 View  Result Date: 06/10/2020 CLINICAL DATA:  Syncope, bradycardia EXAM: PORTABLE CHEST 1 VIEW COMPARISON:  04/19/2020 FINDINGS: The heart size and mediastinal contours are within normal limits. Both lungs are clear. The visualized skeletal structures are unremarkable. IMPRESSION: No active disease. Electronically Signed   By: Randa Ngo M.D.   On: 06/10/2020 16:39     Assessment and Mathis:   Principal Problem:   Closed displaced trimalleolar fracture of right ankle Active Problems:   Osteopenia   Paroxysmal atrial fibrillation (HCC)   Mild cognitive impairment   Leukocytosis   Bradycardia   Syncope  Syncope and bradycardia -  This is her first lifetime episode of syncope per  her report.  She experienced syncope when rising from the toilet after a bowel movement, and her syncopal episode could perhaps be neurocardiogenic.  However she has bradycardia and is on Toprol-XL 100 mg daily.  She has a narrow QRS complex and a normal PR interval.  She will need to be monitored on telemetry and would recommend beta-blocker washout, her metoprolol has already been stopped.  She is not using the as needed diltiazem she was  prescribed at home.  We will repeat an echocardiogram and monitor on telemetry off of AV nodal blocking agents.  Unfortunately she cannot ambulate which will make evaluation for chronotropic incompetence in hospital slightly more difficult.  Cardiology will follow along to review telemetry as she remains off of beta-blockade, and to review findings of echocardiogram.  Final comments about preop cardiovascular risk are pending.  Discussed in great detail with the patient and her son at the bedside.  Her warfarin is currently on hold and her INR remains in the therapeutic range.  Would initiate heparin when INR is less than 2 and continued through the perioperative period for anticoagulation and atrial fibrillation.     CHA2DS2-VASc Score = 5  This indicates a 7.2% annual risk of stroke. The patient's score is based upon: CHF History: 0 HTN History: 1 Diabetes History: 1 Stroke History: 0 Vascular Disease History: 0 Age Score: 2 Gender Score: 1      For questions or updates, please contact [place] Please consult [email or web address] for contact info under    Signed, Elouise Munroe, MD  06/10/2020 8:48 PM

## 2020-06-10 NOTE — ED Notes (Signed)
IV team at bedside 

## 2020-06-11 ENCOUNTER — Inpatient Hospital Stay (HOSPITAL_COMMUNITY): Payer: Medicare Other

## 2020-06-11 DIAGNOSIS — S82851A Displaced trimalleolar fracture of right lower leg, initial encounter for closed fracture: Secondary | ICD-10-CM | POA: Diagnosis not present

## 2020-06-11 DIAGNOSIS — R55 Syncope and collapse: Secondary | ICD-10-CM | POA: Diagnosis not present

## 2020-06-11 DIAGNOSIS — R001 Bradycardia, unspecified: Secondary | ICD-10-CM | POA: Diagnosis not present

## 2020-06-11 LAB — BASIC METABOLIC PANEL
Anion gap: 14 (ref 5–15)
BUN: 18 mg/dL (ref 8–23)
CO2: 23 mmol/L (ref 22–32)
Calcium: 9.2 mg/dL (ref 8.9–10.3)
Chloride: 103 mmol/L (ref 98–111)
Creatinine, Ser: 0.94 mg/dL (ref 0.44–1.00)
GFR, Estimated: 59 mL/min — ABNORMAL LOW (ref >60)
Glucose, Bld: 173 mg/dL — ABNORMAL HIGH (ref 70–99)
Potassium: 3.6 mmol/L (ref 3.5–5.1)
Sodium: 140 mmol/L (ref 135–145)

## 2020-06-11 LAB — CBC
HCT: 39.7 % (ref 36.0–46.0)
Hemoglobin: 13.5 g/dL (ref 12.0–15.0)
MCH: 31.6 pg (ref 26.0–34.0)
MCHC: 34 g/dL (ref 30.0–36.0)
MCV: 93 fL (ref 80.0–100.0)
Platelets: 207 10*3/uL (ref 150–400)
RBC: 4.27 MIL/uL (ref 3.87–5.11)
RDW: 12.7 % (ref 11.5–15.5)
WBC: 13.9 10*3/uL — ABNORMAL HIGH (ref 4.0–10.5)
nRBC: 0 % (ref 0.0–0.2)

## 2020-06-11 LAB — TYPE AND SCREEN
ABO/RH(D): O POS
Antibody Screen: NEGATIVE

## 2020-06-11 LAB — HEMOGLOBIN A1C
Hgb A1c MFr Bld: 7.4 % — ABNORMAL HIGH (ref 4.8–5.6)
Mean Plasma Glucose: 165.68 mg/dL

## 2020-06-11 LAB — GLUCOSE, CAPILLARY
Glucose-Capillary: 213 mg/dL — ABNORMAL HIGH (ref 70–99)
Glucose-Capillary: 200 mg/dL — ABNORMAL HIGH (ref 70–99)
Glucose-Capillary: 242 mg/dL — ABNORMAL HIGH (ref 70–99)

## 2020-06-11 LAB — SURGICAL PCR SCREEN
MRSA, PCR: NEGATIVE
Staphylococcus aureus: NEGATIVE

## 2020-06-11 LAB — ECHOCARDIOGRAM COMPLETE
Area-P 1/2: 2.11 cm2
Height: 63 in
S' Lateral: 2.1 cm
Weight: 2342.17 oz

## 2020-06-11 LAB — TSH: TSH: 0.319 u[IU]/mL — ABNORMAL LOW (ref 0.350–4.500)

## 2020-06-11 LAB — ABO/RH: ABO/RH(D): O POS

## 2020-06-11 LAB — PROTIME-INR
INR: 2.2 — ABNORMAL HIGH (ref 0.8–1.2)
Prothrombin Time: 23.3 seconds — ABNORMAL HIGH (ref 11.4–15.2)

## 2020-06-11 LAB — T4, FREE: Free T4: 1.13 ng/dL — ABNORMAL HIGH (ref 0.61–1.12)

## 2020-06-11 MED ORDER — MUPIROCIN 2 % EX OINT: 1.0000 "application " | TOPICAL_OINTMENT | Freq: Two times a day (BID) | CUTANEOUS | Status: DC

## 2020-06-11 MED ORDER — VITAMIN K1 10 MG/ML IJ SOLN
10.0000 mg | Freq: Once | INTRAVENOUS | Status: DC
  Filled 2020-06-11: qty 1

## 2020-06-11 MED ORDER — SODIUM CHLORIDE 0.9 % IV SOLN
INTRAVENOUS | Status: DC | PRN
  Administered 2020-06-11: 250 mL via INTRAVENOUS

## 2020-06-11 MED ORDER — GLUCERNA SHAKE PO LIQD
237.0000 mL | Freq: Three times a day (TID) | ORAL | Status: DC
  Administered 2020-06-11 – 2020-06-19 (×20): 237 mL via ORAL

## 2020-06-11 MED ORDER — INSULIN ASPART 100 UNIT/ML ~~LOC~~ SOLN
0.0000 [IU] | Freq: Three times a day (TID) | SUBCUTANEOUS | Status: DC
  Administered 2020-06-11 – 2020-06-12 (×5): 3 [IU] via SUBCUTANEOUS
  Administered 2020-06-13: 5 [IU] via SUBCUTANEOUS
  Administered 2020-06-13: 2 [IU] via SUBCUTANEOUS
  Administered 2020-06-13: 5 [IU] via SUBCUTANEOUS
  Administered 2020-06-14: 1 [IU] via SUBCUTANEOUS
  Administered 2020-06-14: 2 [IU] via SUBCUTANEOUS
  Administered 2020-06-15: 3 [IU] via SUBCUTANEOUS
  Administered 2020-06-15 (×2): 2 [IU] via SUBCUTANEOUS
  Administered 2020-06-16: 3 [IU] via SUBCUTANEOUS
  Administered 2020-06-16 – 2020-06-17 (×3): 2 [IU] via SUBCUTANEOUS
  Administered 2020-06-17: 1 [IU] via SUBCUTANEOUS
  Administered 2020-06-17 – 2020-06-18 (×2): 2 [IU] via SUBCUTANEOUS
  Administered 2020-06-18: 3 [IU] via SUBCUTANEOUS
  Administered 2020-06-18: 2 [IU] via SUBCUTANEOUS
  Administered 2020-06-19: 1 [IU] via SUBCUTANEOUS

## 2020-06-11 MED ORDER — METOPROLOL TARTRATE 5 MG/5ML IV SOLN
5.0000 mg | Freq: Once | INTRAVENOUS | Status: AC
  Administered 2020-06-11: 5 mg via INTRAVENOUS
  Filled 2020-06-11: qty 5

## 2020-06-11 MED ORDER — ADULT MULTIVITAMIN W/MINERALS CH
1.0000 | ORAL_TABLET | Freq: Every day | ORAL | Status: DC
  Administered 2020-06-11 – 2020-06-19 (×8): 1 via ORAL
  Filled 2020-06-11 (×9): qty 1

## 2020-06-11 MED ORDER — PHYTONADIONE 5 MG PO TABS
5.0000 mg | ORAL_TABLET | Freq: Once | ORAL | Status: AC
  Administered 2020-06-11: 5 mg via ORAL
  Filled 2020-06-11: qty 1

## 2020-06-11 NOTE — Progress Notes (Addendum)
Patient resting in bed at this time, heart rate going up to 169, afib reports she feel warm, denies pain, BP 153/76, notified oncall hospitalist and [facility] DR. Radford Pax

## 2020-06-11 NOTE — Progress Notes (Signed)
Patient transferred from ED at 0905hrs. Oriented to unit and plan of care for shift.

## 2020-06-11 NOTE — Progress Notes (Signed)
[place] for Heparin Indication: atrial fibrillation  Allergies  Allergen Reactions  . Epinephrine Palpitations    heart racing    Patient Measurements: Height: 5\' 3"  (160 cm) Weight: 66.4 kg (146 lb 6.2 oz) IBW/kg (Calculated) : 52.4 Heparin Dosing Weight: 66.4kg  Vital Signs: Temp: 98.2 F (36.8 C) (12/03 1049) Temp Source: Oral (12/03 1049) BP: 140/58 (12/03 1049) Pulse Rate: 58 (12/03 1049)  Labs: Recent Labs    06/10/20 1303 06/10/20 1943 06/11/20 0333  HGB 14.6  --  13.5  HCT 41.9  --  39.7  PLT 287  --  207  LABPROT  --  25.0* 23.3*  INR  --  2.4* 2.2*  CREATININE 0.84  --  0.94    Estimated Creatinine Clearance: 40.1 mL/min (by C-G formula based on SCr of 0.94 mg/dL).   Medical History: Past Medical History:  Diagnosis Date  . Anxiety   . Borderline diabetes mellitus   . Cancer ([place]) 2020   breast  . Diverticulosis of colon   . DJD (degenerative joint disease)   . Dyslipidemia   . History of hematuria   . History of osteoporosis   . Hx of colonic polyps   . Hypertension   . Hypothyroid   . Macular degeneration    sees [name] for general eye care, sees [name] for retinal injections   . Memory loss   . Osteoporosis    took bisphosphonates, now off. Last DEXA in 2015 showed osteopenia   . Paroxysmal atrial fibrillation (HCC)    sees [name]   . SCCA (squamous cell carcinoma) of skin 12/11/2019   Left Hand Posterior (in situ)    Medications:  Scheduled:  . anastrozole  1 mg Oral Daily  . docusate sodium  100 mg Oral BID  . feeding supplement (GLUCERNA SHAKE)  237 mL Oral TID BM  . hydrochlorothiazide  25 mg Oral Daily  . insulin aspart  0-9 Units Subcutaneous TID WC  . losartan  100 mg Oral Daily  . memantine  10 mg Oral BID  . multivitamin with minerals  1 tablet Oral Daily  . phytonadione  5 mg Oral Once    Assessment: Patient is a 77 yof that is being admitted after  passing out. Patient was found to have ankle fracture with plans for ortho taking the patient to the OR early next week for an ORIF. Patient takes warfarin at home for afib and pharmacy has been asked to bridge the patient with heparin while holding warfarin.  -INR= 2.2 -plans for vitamin K 5mg  po today per ortho  Goal of Therapy:  Heparin level 0.3-0.7 units/ml Monitor platelets by anticoagulation protocol: Yes   Plan:  - Will hold warfarin this evening  -Start heparin when INR < 2.0 -Check INR in am  Hildred Laser, PharmD Clinical Pharmacist **Pharmacist phone directory can now be found on [email or web address] ([email or web address]).  Listed under [place].

## 2020-06-11 NOTE — Progress Notes (Signed)
Patient is stable from ortho stand point.  We will plan for ORIF of ankle fracture early next week.  No plan for surgery over the weekend.  INR is trending downwards.  I ordered 10 mg of vitamin K.  Will follow along.  Azucena Cecil, MD [facility] [phone] 2:03 PM

## 2020-06-11 NOTE — Progress Notes (Addendum)
Progress Note  Patient Name: Cynthia Mathis Date of Encounter: 06/11/2020  Primary Cardiologist: Ena Dawley, MD   Subjective   She feels well today without dizziness, lightheadedness, and no recurrent feelings of syncope. She remains on telemetry and no events have been documented while she was in the emergency department overnight. At the time of my visit echocardiogram was pending.  Inpatient Medications    Scheduled Meds: . anastrozole  1 mg Oral Daily  . docusate sodium  100 mg Oral BID  . feeding supplement (GLUCERNA SHAKE)  237 mL Oral TID BM  . hydrochlorothiazide  25 mg Oral Daily  . insulin aspart  0-9 Units Subcutaneous TID WC  . losartan  100 mg Oral Daily  . memantine  10 mg Oral BID  . multivitamin with minerals  1 tablet Oral Daily   Continuous Infusions: . cefTRIAXone (ROCEPHIN)  IV Stopped (06/10/20 2028)   PRN Meds: HYDROcodone-acetaminophen, morphine injection   Vital Signs    Vitals:   06/11/20 0907 06/11/20 0935 06/11/20 1005 06/11/20 1049  BP: (!) 135/58 (!) 138/54 (!) 142/60 (!) 140/58  Pulse: 67 (!) 59 64 (!) 58  Resp:    18  Temp:    98.2 F (36.8 C)  TempSrc:    Oral  SpO2: 96% 98% 97% 97%  Weight:      Height:        Intake/Output Summary (Last 24 hours) at 06/11/2020 1153 Last data filed at 06/11/2020 0912 Gross per 24 hour  Intake 100 ml  Output 50 ml  Net 50 ml   Filed Weights   06/11/20 0904  Weight: 66.4 kg    Telemetry    Sinus rhythm- Personally Reviewed  ECG    Sinus bradycardia, rate 56, borderline T wave abnormality- Personally Reviewed  Physical Exam   GEN: No acute distress.   Neck: No JVD Cardiac:  Regular rhythm, bradycardic, 2/6 systolic murmur Respiratory: Clear to auscultation bilaterally. GI: Soft, nontender, non-distended  MS: No edema; right lower extremity in cast Neuro:  Nonfocal  Psych: Normal affect   Labs    Chemistry Recent Labs  Lab 06/10/20 1303 06/11/20 0333  NA 139 140   K 4.6 3.6  CL 101 103  CO2 23 23  GLUCOSE 160* 173*  BUN 17 18  CREATININE 0.84 0.94  CALCIUM 9.4 9.2  GFRNONAA >60 59*  ANIONGAP 15 14     Hematology Recent Labs  Lab 06/10/20 1303 06/11/20 0333  WBC 15.0* 13.9*  RBC 4.54 4.27  HGB 14.6 13.5  HCT 41.9 39.7  MCV 92.3 93.0  MCH 32.2 31.6  MCHC 34.8 34.0  RDW 12.5 12.7  PLT 287 207    Cardiac EnzymesNo results for input(s): TROPONINI in the last 168 hours. No results for input(s): TROPIPOC in the last 168 hours.   BNPNo results for input(s): BNP, PROBNP in the last 168 hours.   DDimer No results for input(s): DDIMER in the last 168 hours.   Radiology    DG Ankle Complete Right  Result Date: 06/10/2020 CLINICAL DATA:  Status post reduction EXAM: RIGHT ANKLE - COMPLETE 3+ VIEW COMPARISON:  06/10/2020 FINDINGS: Status post close reduction of bimalleolar fracture. The fracture fragments are in near anatomic alignment. Overlying splinting material noted. IMPRESSION: 1. Status post closed reduction of bimalleolar fracture of the right lower extremity 2. Fracture fragments are in near anatomic alignment. Electronically Signed   By: Kerby Moors M.D.   On: 06/10/2020 14:20   DG Ankle  Complete Right  Result Date: 06/10/2020 CLINICAL DATA:  Right ankle pain after a fall. EXAM: RIGHT ANKLE - COMPLETE 3+ VIEW COMPARISON:  Foot radiographs dated 12/05/2017. FINDINGS: There is a comminuted fracture of the distal fibula at or above the level of the tibiofibular syndesmosis. No definite widening of the distal tibiofibular articulation. There is a mildly displaced fracture of the medial malleolus with subluxation of the talar dome laterally. A nondisplaced posterior malleolus fracture is difficult to exclude. There is surrounding soft tissue swelling. Plantar and posterior calcaneal enthesophytes are noted. IMPRESSION: 1. Bimalleolar fracture with mild subluxation of the talar dome laterally. 2. Nondisplaced posterior malleolus fracture is  difficult to exclude. Electronically Signed   By: Zerita Boers M.D.   On: 06/10/2020 11:25   CT Head Wo Contrast  Result Date: 06/10/2020 CLINICAL DATA:  Pain following fall.  Syncope. EXAM: CT HEAD WITHOUT CONTRAST TECHNIQUE: Contiguous axial images were obtained from the base of the skull through the vertex without intravenous contrast. COMPARISON:  None. FINDINGS: Brain: There is mild diffuse atrophy. There is no intracranial mass, hemorrhage, extra-axial fluid collection, or midline shift. There is mild small vessel disease in the centra semiovale bilaterally. Elsewhere brain parenchyma appears unremarkable. No acute infarct evident. Vascular: No hyperdense vessel. There is calcification in each carotid siphon region. Skull: Bony calvarium appears intact. Sinuses/Orbits: There is mucosal thickening in the left maxillary antrum. There is mucosal thickening in several ethmoid air cells. Orbits appear symmetric bilaterally. Other: Mastoid air cells are clear. IMPRESSION: Mild atrophy with mild periventricular small vessel disease. No acute infarct. No mass or hemorrhage. There are foci of arterial vascular calcification. There is mild mucosal thickening in several paranasal sinus regions. Electronically Signed   By: Lowella Grip III M.D.   On: 06/10/2020 11:48   CT ANKLE RIGHT WO CONTRAST  Result Date: 06/10/2020 CLINICAL DATA:  The patient suffered right ankle fractures in a fall today. Initial encounter. EXAM: CT OF THE RIGHT ANKLE WITHOUT CONTRAST TECHNIQUE: Multidetector CT imaging of the right ankle was performed according to the standard protocol. Multiplanar CT image reconstructions were also generated. COMPARISON:  Plain films right ankle earlier today. FINDINGS: Bones/Joint/Cartilage Acute fracture of the posterior malleolus is seen. The fracture fragment is triangular in shape with the base of the triangle adjacent to the fibula. Fracture fragment measures up to 0.7 cm AP by 2.3 cm  transverse at the plafond by 2 cm craniocaudal. The fracture fragment demonstrates slight posterior and lateral displacement. Also seen is a fracture of the medial malleolus. The distal fragment is inferiorly displaced approximately 0.6 cm and demonstrates slight medial displacement. The patient has an acute diaphyseal fracture of the distal fibula. The fracture originates approximately 6.5 cm above the tip of the lateral malleolus. The fracture is mildly comminuted with 3 fragments identified. There is slight fragment override and mild lateral angulation of the distal fragment. Also seen is an avulsion fracture off of the anterior aspect of the lateral malleolus where a fracture fragment measuring approximately 0.6 cm in diameter is anteriorly displaced approximately 0.3 cm. Ligaments Suboptimally assessed by CT. Avulsion fracture off the anterior aspect of the lateral malleolus is at the expected attachment site of the anterior, inferior tibiofibular ligament. As visualized by CT scan, ligamentous structures about the ankle are otherwise unremarkable. Muscles and Tendons Appear intact.  No tendon entrapment. Soft tissues Soft tissue contusion and hematoma about the patient's fractures noted. IMPRESSION: Ankle fractures most consistent with a Weber C4 injury. Although the  anterior inferior tibiofibular ligament is likely intact, there is an avulsion fracture at its attachment site to the fibula. Electronically Signed   By: Inge Rise M.D.   On: 06/10/2020 17:36   Chest Portable 1 View  Result Date: 06/10/2020 CLINICAL DATA:  Syncope, bradycardia EXAM: PORTABLE CHEST 1 VIEW COMPARISON:  04/19/2020 FINDINGS: The heart size and mediastinal contours are within normal limits. Both lungs are clear. The visualized skeletal structures are unremarkable. IMPRESSION: No active disease. Electronically Signed   By: Randa Ngo M.D.   On: 06/10/2020 16:39    Cardiac Studies   Echocardiogram pending  Patient  Profile     86 y.o. female with diabetes, hyperlipidemia, hypertension, PAF, breast cancer who presents to the hospital with an episode of syncope leading to a right ankle fracture  Assessment & Plan   Principal Problem:   Closed displaced trimalleolar fracture of right ankle Active Problems:   Osteopenia   Paroxysmal atrial fibrillation (HCC)   Mild cognitive impairment   Leukocytosis   Bradycardia   Syncope   Her echocardiogram is pending final read, however I have briefly reviewed the images.  She appears to have a hyperdynamic ventricle, RA pressure estimate of approximately 3 mmHg.  Her heart rate is improving slightly while holding metoprolol, and she has not received a dose today, and she does not think she took her dose of metoprolol yesterday.  We will want to observe on telemetry for 1 additional day to make final comments about her risk if acceptable to the surgical team.  If plan for operative management today, final comments can be provided.  Would asked the surgical team to reach out if this is the case.  Her hyperdynamic ventricle may be part of what led to syncope, would encourage adequate hydration.  There was no significant aortic valve stenosis noted on echocardiogram on my preliminary review.    For questions or updates, please contact Jennette Please consult [email or web address] for contact info under        Signed, Elouise Munroe, MD  06/11/2020, 11:53 AM

## 2020-06-11 NOTE — Progress Notes (Signed)
°  Echocardiogram 2D Echocardiogram has been performed.  Cynthia Mathis 06/11/2020, 10:41 AM

## 2020-06-11 NOTE — Progress Notes (Addendum)
Initial Nutrition Assessment  DOCUMENTATION CODES:   Not applicable  INTERVENTION:    Glucerna Shake po TID, each supplement provides 220 kcal and 10 grams of protein  MVI with minerals daily  NUTRITION DIAGNOSIS:   Increased nutrient needs related to acute illness (ankle fracture) as evidenced by estimated needs.  GOAL:   Patient will meet greater than or equal to 90% of their needs  MONITOR:   PO intake, Supplement acceptance, Labs  REASON FOR ASSESSMENT:   Consult Hip fracture protocol  ASSESSMENT:   86 yo female admitted with bimalleolar fracture of the right foot S/P fall at home. PMH includes macular degeneration, HTN, HLD, DM (diet controlled), diverticulosis, DJD, osteoporosis, memory loss, breast cancer, skin cancer.  No nutrition problems identified on admission. Patient denies any nutrition problems PTA. She typically has a good appetite and eats 3 meals per day at home. Weight has been stable for at least the past year.  Patient is currently on a CHO modified diet. Since admission, appetite has been less than usual. Patient agreed to try PO supplements to ensure adequate protein and kcal intake for healing.  Labs reviewed. Glucose 173, A1C 7.4  Medications reviewed and include colace.  Diet Order:   Diet Order            Diet Carb Modified Fluid consistency: Thin; Room service appropriate? Yes  Diet effective now                 EDUCATION NEEDS:   Not appropriate for education at this time  Skin:  Skin Assessment: Reviewed RN Assessment  Last BM:  12/2  Height:   Ht Readings from Last 1 Encounters:  06/11/20 5\' 3"  (1.6 m)    Weight:   Wt Readings from Last 1 Encounters:  06/11/20 66.4 kg    Ideal Body Weight:  52.3 kg  BMI:  Body mass index is 25.93 kg/m.  Estimated Nutritional Needs:   Kcal:  1650-1850  Protein:  80-90 gm  Fluid:  >/= 1.7 L    Lucas Mallow, RD, LDN, CNSC Please refer to Amion for contact information.

## 2020-06-11 NOTE — ED Notes (Signed)
Breakfast Ordered 

## 2020-06-11 NOTE — Progress Notes (Signed)
PROGRESS NOTE    Cynthia Mathis  JJH:[id number] DOB: [birth year] DOA: 06/10/2020 PCP: [name], MD     Brief Narrative:  Cynthia Mathis is an 86 y.o. female with medical history significant of hypertension, paroxysmal atrial fibrillation on Coumadin, diet-controlled diabetes mellitus type 2, and osteoporosis who presents after reportedly passing out at home.  Patient reports that she had gotten up early that morning and was talking with her houseguest in the kitchen.  She went and use the restroom and had a bowel movement.  The next thing she recalls was waking up on the floor with pain on her right ankle.  She is unsure of how long she was out and does not know if she hit her head.  Denies fever, chills, lightheadedness, palpitations, chest pain, shortness of breath, leg swelling, diarrhea, nausea, vomiting, dysuria, urinary frequency new medications, or any changes to her medications.  At baseline she lives alone, is able to walk without assistance, and complete all of her ADLs.  She does have to have company over at the time and they called 911.  Patient notes that normally her heart rates are higher than the 40s. She was found to have right bimalleolar fracture of the ankle. Due to bradycardia and syncopal episode, cardiology was consulted.  New events last 24 hours / Subjective: Feeling well overall, without any complaints. Pain well controlled.  Assessment & Plan:   Principal Problem:   Closed displaced trimalleolar fracture of right ankle Active Problems:   Osteopenia   Paroxysmal atrial fibrillation (HCC)   Mild cognitive impairment   Leukocytosis   Bradycardia   Syncope   Right ankle fracture -Per orthopedic surgery  Syncope and bradycardia -Hold metoprolol -Continue telemetry -Appreciate cardiology following for surgical risk  UTI, present on admission -Empiric Rocephin. Urine culture pending  Paroxysmal atrial fibrillation -On Coumadin at home. Continue IV  heparin prior to surgery  Essential hypertension -Hold metoprolol, diltiazem. Continue HCTZ/cozaar   Type 2 diabetes, well controlled -Hemoglobin A1c 7.4 -Sliding scale insulin  All cognitive impairment -Hold Aricept due to bradycardia -Continue namenda   History of breast cancer -Continue anastrozole  Hypothyroidism -Mildly elevated free T4 1.13, low TSH 0.319. Repeat labs as outpatient in 4-6 weeks   DVT prophylaxis: IV heparin. Hold Coumadin   Code Status: Full code Family Communication: None at bedside Disposition Plan:  Status is: Inpatient  Remains inpatient appropriate because:Ongoing diagnostic testing needed not appropriate for outpatient work up   Dispo: The patient is from: Home              Anticipated d/c is to: To be determined              Anticipated d/c date is: > 3 days              Patient currently is not medically stable to d/c. Continue monitoring bradycardia on telemetry. Await cardiology recommendations/surgical clearance for her right ankle fracture.      Consultants:   Orthopedic surgery  Cardiology  Procedures:   None  Antimicrobials:  Anti-infectives (From admission, onward)   Start     Dose/Rate Route Frequency Ordered Stop   06/10/20 1900  cefTRIAXone (ROCEPHIN) 1 g in sodium chloride 0.9 % 100 mL IVPB        1 g 200 mL/hr over 30 Minutes Intravenous Every 24 hours 06/10/20 1855          Objective: Vitals:   06/11/20 0907 06/11/20 0935 06/11/20 1005 06/11/20 1049  BP: (!) 135/58 (!) 138/54 (!) 142/60 (!) 140/58  Pulse: 67 (!) 59 64 (!) 58  Resp:    18  Temp:    98.2 F (36.8 C)  TempSrc:    Oral  SpO2: 96% 98% 97% 97%  Weight:      Height:        Intake/Output Summary (Last 24 hours) at 06/11/2020 1305 Last data filed at 06/11/2020 0912 Gross per 24 hour  Intake 100 ml  Output 50 ml  Net 50 ml   Filed Weights   06/11/20 0904  Weight: 66.4 kg    Examination:  General exam: Appears calm and comfortable   Respiratory system: Clear to auscultation. Respiratory effort normal. No respiratory distress. No conversational dyspnea.  Cardiovascular system: S1 & S2 heard, RRR rate 60s. No murmurs. No pedal edema. Gastrointestinal system: Abdomen is nondistended, soft and nontender. Normal bowel sounds heard. Central nervous system: Alert and oriented. No focal neurological deficits. Speech clear.  Extremities: Right ankle in brace Skin: No rashes, lesions or ulcers on exposed skin  Psychiatry: Judgement and insight appear normal. Mood & affect appropriate.   Data Reviewed: I have personally reviewed following labs and imaging studies  CBC: Recent Labs  Lab 06/10/20 1303 06/11/20 0333  WBC 15.0* 13.9*  NEUTROABS 11.1*  --   HGB 14.6 13.5  HCT 41.9 39.7  MCV 92.3 93.0  PLT 287 854   Basic Metabolic Panel: Recent Labs  Lab 06/10/20 1303 06/11/20 0333  NA 139 140  K 4.6 3.6  CL 101 103  CO2 23 23  GLUCOSE 160* 173*  BUN 17 18  CREATININE 0.84 0.94  CALCIUM 9.4 9.2   GFR: Estimated Creatinine Clearance: 40.1 mL/min (by C-G formula based on SCr of 0.94 mg/dL). Liver Function Tests: No results for input(s): AST, ALT, ALKPHOS, BILITOT, PROT, ALBUMIN in the last 168 hours. No results for input(s): LIPASE, AMYLASE in the last 168 hours. No results for input(s): AMMONIA in the last 168 hours. Coagulation Profile: Recent Labs  Lab 06/10/20 1943 06/11/20 0333  INR 2.4* 2.2*   Cardiac Enzymes: No results for input(s): CKTOTAL, CKMB, CKMBINDEX, TROPONINI in the last 168 hours. BNP (last 3 results) No results for input(s): PROBNP in the last 8760 hours. HbA1C: Recent Labs    06/11/20 0333  HGBA1C 7.4*   CBG: Recent Labs  Lab 06/11/20 1049  GLUCAP 213*   Lipid Profile: No results for input(s): CHOL, HDL, LDLCALC, TRIG, CHOLHDL, LDLDIRECT in the last 72 hours. Thyroid Function Tests: Recent Labs    06/11/20 0333  TSH 0.319*  FREET4 1.13*   Anemia Panel: No results for  input(s): VITAMINB12, FOLATE, FERRITIN, TIBC, IRON, RETICCTPCT in the last 72 hours. Sepsis Labs: No results for input(s): PROCALCITON, LATICACIDVEN in the last 168 hours.  Recent Results (from the past 240 hour(s))  Resp Panel by RT-PCR (Flu A&B, Covid) Nasopharyngeal Swab     Status: None   Collection Time: 06/10/20  3:18 PM   Specimen: Nasopharyngeal Swab; Nasopharyngeal(NP) swabs in vial transport medium  Result Value Ref Range Status   SARS Coronavirus 2 by RT PCR NEGATIVE NEGATIVE Final    Comment: (NOTE) SARS-CoV-2 target nucleic acids are NOT DETECTED.  The SARS-CoV-2 RNA is generally detectable in upper respiratory specimens during the acute phase of infection. The lowest concentration of SARS-CoV-2 viral copies this assay can detect is 138 copies/mL. A negative result does not preclude SARS-Cov-2 infection and should not be used as the sole basis for  treatment or other patient management decisions. A negative result may occur with  improper specimen collection/handling, submission of specimen other than nasopharyngeal swab, presence of viral mutation(s) within the areas targeted by this assay, and inadequate number of viral copies(<138 copies/mL). A negative result must be combined with clinical observations, patient history, and epidemiological information. The expected result is Negative.  Fact Sheet for Patients:  EntrepreneurPulse.com.au  Fact Sheet for Healthcare Providers:  IncredibleEmployment.be  This test is no t yet approved or cleared by the [place] FDA and  has been authorized for detection and/or diagnosis of SARS-CoV-2 by FDA under an Emergency Use Authorization (EUA). This EUA will remain  in effect (meaning this test can be used) for the duration of the COVID-19 declaration under Section 564(b)(1) of the Act, 21 U.S.C.section 360bbb-3(b)(1), unless the authorization is terminated  or revoked sooner.        Influenza A by PCR NEGATIVE NEGATIVE Final   Influenza B by PCR NEGATIVE NEGATIVE Final    Comment: (NOTE) The Xpert Xpress SARS-CoV-2/FLU/RSV plus assay is intended as an aid in the diagnosis of influenza from Nasopharyngeal swab specimens and should not be used as a sole basis for treatment. Nasal washings and aspirates are unacceptable for Xpert Xpress SARS-CoV-2/FLU/RSV testing.  Fact Sheet for Patients: EntrepreneurPulse.com.au  Fact Sheet for Healthcare Providers: IncredibleEmployment.be  This test is not yet approved or cleared by the [place] FDA and has been authorized for detection and/or diagnosis of SARS-CoV-2 by FDA under an Emergency Use Authorization (EUA). This EUA will remain in effect (meaning this test can be used) for the duration of the COVID-19 declaration under Section 564(b)(1) of the Act, 21 U.S.C. section 360bbb-3(b)(1), unless the authorization is terminated or revoked.  Performed at [facility], [place] [address] [place], [place] [address]       Radiology Studies: DG Ankle Complete Right  Result Date: 06/10/2020 CLINICAL DATA:  Status post reduction EXAM: RIGHT ANKLE - COMPLETE 3+ VIEW COMPARISON:  06/10/2020 FINDINGS: Status post close reduction of bimalleolar fracture. The fracture fragments are in near anatomic alignment. Overlying splinting material noted. IMPRESSION: 1. Status post closed reduction of bimalleolar fracture of the right lower extremity 2. Fracture fragments are in near anatomic alignment. Electronically Signed   By: Kerby Moors M.D.   On: 06/10/2020 14:20   DG Ankle Complete Right  Result Date: 06/10/2020 CLINICAL DATA:  Right ankle pain after a fall. EXAM: RIGHT ANKLE - COMPLETE 3+ VIEW COMPARISON:  Foot radiographs dated 12/05/2017. FINDINGS: There is a comminuted fracture of the distal fibula at or above the level of the tibiofibular syndesmosis. No definite widening of the distal  tibiofibular articulation. There is a mildly displaced fracture of the medial malleolus with subluxation of the talar dome laterally. A nondisplaced posterior malleolus fracture is difficult to exclude. There is surrounding soft tissue swelling. Plantar and posterior calcaneal enthesophytes are noted. IMPRESSION: 1. Bimalleolar fracture with mild subluxation of the talar dome laterally. 2. Nondisplaced posterior malleolus fracture is difficult to exclude. Electronically Signed   By: Zerita Boers M.D.   On: 06/10/2020 11:25   CT Head Wo Contrast  Result Date: 06/10/2020 CLINICAL DATA:  Pain following fall.  Syncope. EXAM: CT HEAD WITHOUT CONTRAST TECHNIQUE: Contiguous axial images were obtained from the base of the skull through the vertex without intravenous contrast. COMPARISON:  None. FINDINGS: Brain: There is mild diffuse atrophy. There is no intracranial mass, hemorrhage, extra-axial fluid collection, or midline shift. There is mild  small vessel disease in the centra semiovale bilaterally. Elsewhere brain parenchyma appears unremarkable. No acute infarct evident. Vascular: No hyperdense vessel. There is calcification in each carotid siphon region. Skull: Bony calvarium appears intact. Sinuses/Orbits: There is mucosal thickening in the left maxillary antrum. There is mucosal thickening in several ethmoid air cells. Orbits appear symmetric bilaterally. Other: Mastoid air cells are clear. IMPRESSION: Mild atrophy with mild periventricular small vessel disease. No acute infarct. No mass or hemorrhage. There are foci of arterial vascular calcification. There is mild mucosal thickening in several paranasal sinus regions. Electronically Signed   By: Lowella Grip III M.D.   On: 06/10/2020 11:48   CT ANKLE RIGHT WO CONTRAST  Result Date: 06/10/2020 CLINICAL DATA:  The patient suffered right ankle fractures in a fall today. Initial encounter. EXAM: CT OF THE RIGHT ANKLE WITHOUT CONTRAST TECHNIQUE:  Multidetector CT imaging of the right ankle was performed according to the standard protocol. Multiplanar CT image reconstructions were also generated. COMPARISON:  Plain films right ankle earlier today. FINDINGS: Bones/Joint/Cartilage Acute fracture of the posterior malleolus is seen. The fracture fragment is triangular in shape with the base of the triangle adjacent to the fibula. Fracture fragment measures up to 0.7 cm AP by 2.3 cm transverse at the plafond by 2 cm craniocaudal. The fracture fragment demonstrates slight posterior and lateral displacement. Also seen is a fracture of the medial malleolus. The distal fragment is inferiorly displaced approximately 0.6 cm and demonstrates slight medial displacement. The patient has an acute diaphyseal fracture of the distal fibula. The fracture originates approximately 6.5 cm above the tip of the lateral malleolus. The fracture is mildly comminuted with 3 fragments identified. There is slight fragment override and mild lateral angulation of the distal fragment. Also seen is an avulsion fracture off of the anterior aspect of the lateral malleolus where a fracture fragment measuring approximately 0.6 cm in diameter is anteriorly displaced approximately 0.3 cm. Ligaments Suboptimally assessed by CT. Avulsion fracture off the anterior aspect of the lateral malleolus is at the expected attachment site of the anterior, inferior tibiofibular ligament. As visualized by CT scan, ligamentous structures about the ankle are otherwise unremarkable. Muscles and Tendons Appear intact.  No tendon entrapment. Soft tissues Soft tissue contusion and hematoma about the patient's fractures noted. IMPRESSION: Ankle fractures most consistent with a Weber C4 injury. Although the anterior inferior tibiofibular ligament is likely intact, there is an avulsion fracture at its attachment site to the fibula. Electronically Signed   By: Inge Rise M.D.   On: 06/10/2020 17:36   Chest  Portable 1 View  Result Date: 06/10/2020 CLINICAL DATA:  Syncope, bradycardia EXAM: PORTABLE CHEST 1 VIEW COMPARISON:  04/19/2020 FINDINGS: The heart size and mediastinal contours are within normal limits. Both lungs are clear. The visualized skeletal structures are unremarkable. IMPRESSION: No active disease. Electronically Signed   By: Randa Ngo M.D.   On: 06/10/2020 16:39   ECHOCARDIOGRAM COMPLETE  Result Date: 06/11/2020    ECHOCARDIOGRAM REPORT   Patient Name:   MASEY SCHEIBER Date of Exam: 06/11/2020 Medical Rec #:  [id number]         Height:       63.0 in Accession #:    [id number]        Weight:       146.4 lb Date of Birth:  [birth year]          BSA:          1.693 m Patient Age:  87 years          BP:           142/60 mmHg Patient Gender: F                 HR:           57 bpm. Exam Location:  Inpatient Procedure: 2D Echo, Cardiac Doppler and Color Doppler Indications:    R55 Syncope  History:        Patient has prior history of Echocardiogram examinations, most                 recent 09/11/2016. Arrythmias:Atrial Fibrillation; Risk                 Factors:Hypertension and Dyslipidemia. Cancer. Hypothyroidism.  Sonographer:    Tiffany Dance Referring Phys: [id number] [place]  1. Left ventricular ejection fraction, by estimation, is 65 to 70%. The left ventricle has normal function. The left ventricle has no regional wall motion abnormalities. Left ventricular diastolic parameters are indeterminate.  2. Right ventricular systolic function is normal. The right ventricular size is normal. There is normal pulmonary artery systolic pressure.  3. The mitral valve is normal in structure. No evidence of mitral valve regurgitation. No evidence of mitral stenosis.  4. The aortic valve is normal in structure. Aortic valve regurgitation is trivial. No aortic stenosis is present. FINDINGS  Left Ventricle: Left ventricular ejection fraction, by estimation, is 65 to 70%. The left ventricle  has normal function. The left ventricle has no regional wall motion abnormalities. The left ventricular internal cavity size was normal in size. There is  no left ventricular hypertrophy. Left ventricular diastolic parameters are indeterminate. Right Ventricle: The right ventricular size is normal. No increase in right ventricular wall thickness. Right ventricular systolic function is normal. There is normal pulmonary artery systolic pressure. The tricuspid regurgitant velocity is 2.77 m/s, and  with an assumed right atrial pressure of 3 mmHg, the estimated right ventricular systolic pressure is 12.4 mmHg. Left Atrium: Left atrial size was normal in size. Right Atrium: Right atrial size was normal in size. Pericardium: There is no evidence of pericardial effusion. Mitral Valve: The mitral valve is normal in structure. No evidence of mitral valve regurgitation. No evidence of mitral valve stenosis. Tricuspid Valve: The tricuspid valve is normal in structure. Tricuspid valve regurgitation is trivial. Aortic Valve: The aortic valve is normal in structure. Aortic valve regurgitation is trivial. No aortic stenosis is present. Pulmonic Valve: The pulmonic valve was normal in structure. Pulmonic valve regurgitation is not visualized. Aorta: The aortic root and ascending aorta are structurally normal, with no evidence of dilitation. IAS/Shunts: The atrial septum is grossly normal.  LEFT VENTRICLE PLAX 2D LVIDd:         3.90 cm  Diastology LVIDs:         2.10 cm  LV e' medial:    4.46 cm/s LV PW:         1.20 cm  LV E/e' medial:  14.2 LV IVS:        1.00 cm  LV e' lateral:   5.44 cm/s LVOT diam:     2.00 cm  LV E/e' lateral: 11.7 LV SV:         96 LV SV Index:   57 LVOT Area:     3.14 cm  RIGHT VENTRICLE             IVC RV Basal diam:  2.50 cm  IVC diam: 1.50 cm RV S prime:     18.30 cm/s TAPSE (M-mode): 1.8 cm LEFT ATRIUM           Index       RIGHT ATRIUM          Index LA diam:      3.00 cm 1.77 cm/m  RA Area:      9.60 cm LA Vol (A2C): 39.0 ml 23.03 ml/m RA Volume:   16.20 ml 9.57 ml/m LA Vol (A4C): 32.3 ml 19.07 ml/m  AORTIC VALVE LVOT Vmax:   140.00 cm/s LVOT Vmean:  88.500 cm/s LVOT VTI:    0.307 m  AORTA Ao Root diam: 3.60 cm Ao Asc diam:  3.00 cm MITRAL VALVE               TRICUSPID VALVE MV Area (PHT): 2.11 cm    TR Peak grad:   30.7 mmHg MV Decel Time: 359 msec    TR Vmax:        277.00 cm/s MV E velocity: 63.40 cm/s MV A velocity: 96.80 cm/s  SHUNTS MV E/A ratio:  0.65        Systemic VTI:  0.31 m                            Systemic Diam: 2.00 cm Mertie Moores MD Electronically signed by Mertie Moores MD Signature Date/Time: 06/11/2020/12:55:12 PM    Final       Scheduled Meds: . anastrozole  1 mg Oral Daily  . docusate sodium  100 mg Oral BID  . feeding supplement (GLUCERNA SHAKE)  237 mL Oral TID BM  . hydrochlorothiazide  25 mg Oral Daily  . insulin aspart  0-9 Units Subcutaneous TID WC  . losartan  100 mg Oral Daily  . memantine  10 mg Oral BID  . multivitamin with minerals  1 tablet Oral Daily   Continuous Infusions: . cefTRIAXone (ROCEPHIN)  IV Stopped (06/10/20 2028)     LOS: 1 day      Time spent: 40 minutes   Dessa Phi, DO [facility] 06/11/2020, 1:05 PM   Available via Epic secure chat 7am-7pm After these hours, please refer to coverage provider listed on [email or web address]

## 2020-06-11 NOTE — Progress Notes (Signed)
  Echocardiogram 2D Echocardiogram has been attempted. Patient OTF. Will reattempt at later time.  Rubens Cranston G Indie Nickerson 06/11/2020, 9:03 AM

## 2020-06-11 NOTE — ED Notes (Addendum)
Pt cleaned of urinary incontinence. Perineal care provided. New brief applied. Pt repositioned.

## 2020-06-12 DIAGNOSIS — R55 Syncope and collapse: Secondary | ICD-10-CM | POA: Diagnosis not present

## 2020-06-12 DIAGNOSIS — R001 Bradycardia, unspecified: Secondary | ICD-10-CM | POA: Diagnosis not present

## 2020-06-12 DIAGNOSIS — S82851A Displaced trimalleolar fracture of right lower leg, initial encounter for closed fracture: Secondary | ICD-10-CM | POA: Diagnosis not present

## 2020-06-12 LAB — CBC
HCT: 41.4 % (ref 36.0–46.0)
Hemoglobin: 14.6 g/dL (ref 12.0–15.0)
MCH: 31.8 pg (ref 26.0–34.0)
MCHC: 35.3 g/dL (ref 30.0–36.0)
MCV: 90.2 fL (ref 80.0–100.0)
Platelets: 203 10*3/uL (ref 150–400)
RBC: 4.59 MIL/uL (ref 3.87–5.11)
RDW: 12.5 % (ref 11.5–15.5)
WBC: 14.7 10*3/uL — ABNORMAL HIGH (ref 4.0–10.5)
nRBC: 0 % (ref 0.0–0.2)

## 2020-06-12 LAB — GLUCOSE, CAPILLARY
Glucose-Capillary: 194 mg/dL — ABNORMAL HIGH (ref 70–99)
Glucose-Capillary: 213 mg/dL — ABNORMAL HIGH (ref 70–99)
Glucose-Capillary: 250 mg/dL — ABNORMAL HIGH (ref 70–99)
Glucose-Capillary: 214 mg/dL — ABNORMAL HIGH (ref 70–99)
Glucose-Capillary: 222 mg/dL — ABNORMAL HIGH (ref 70–99)

## 2020-06-12 LAB — HEPARIN LEVEL (UNFRACTIONATED): Heparin Unfractionated: <0.1 IU/mL — ABNORMAL LOW (ref 0.30–0.70)

## 2020-06-12 LAB — PROTIME-INR
INR: 1.6 — ABNORMAL HIGH (ref 0.8–1.2)
Prothrombin Time: 18.4 seconds — ABNORMAL HIGH (ref 11.4–15.2)

## 2020-06-12 MED ORDER — METOPROLOL TARTRATE 25 MG PO TABS
25.0000 mg | ORAL_TABLET | Freq: Two times a day (BID) | ORAL | Status: DC
  Administered 2020-06-12 – 2020-06-14 (×3): 25 mg via ORAL
  Filled 2020-06-12 (×4): qty 1

## 2020-06-12 MED ORDER — METOPROLOL TARTRATE 5 MG/5ML IV SOLN
5.0000 mg | INTRAVENOUS | Status: DC | PRN
  Administered 2020-06-12 – 2020-06-17 (×4): 5 mg via INTRAVENOUS
  Filled 2020-06-12 (×5): qty 5

## 2020-06-12 MED ORDER — HEPARIN (PORCINE) 25000 UT/250ML-% IV SOLN
850.0000 [IU]/h | INTRAVENOUS | Status: DC
  Administered 2020-06-12: 700 [IU]/h via INTRAVENOUS
  Administered 2020-06-13: 1000 [IU]/h via INTRAVENOUS
  Filled 2020-06-12 (×2): qty 250

## 2020-06-12 MED ORDER — METOPROLOL TARTRATE 12.5 MG HALF TABLET
12.5000 mg | ORAL_TABLET | Freq: Four times a day (QID) | ORAL | Status: DC
  Administered 2020-06-12: 12.5 mg via ORAL
  Filled 2020-06-12: qty 1

## 2020-06-12 MED ORDER — METOPROLOL TARTRATE 5 MG/5ML IV SOLN
5.0000 mg | Freq: Once | INTRAVENOUS | Status: AC
  Administered 2020-06-12: 5 mg via INTRAVENOUS
  Filled 2020-06-12: qty 5

## 2020-06-12 MED ORDER — METOPROLOL TARTRATE 25 MG PO TABS
25.0000 mg | ORAL_TABLET | Freq: Four times a day (QID) | ORAL | Status: DC
  Administered 2020-06-12 (×2): 25 mg via ORAL
  Filled 2020-06-12 (×2): qty 1

## 2020-06-12 MED ORDER — OFF THE BEAT BOOK
Freq: Once | Status: AC
  Filled 2020-06-12: qty 1

## 2020-06-12 NOTE — Progress Notes (Signed)
[place] for Heparin when INR is <2 Indication: atrial fibrillation  Allergies  Allergen Reactions  . Epinephrine Palpitations    heart racing    Patient Measurements: Height: 5\' 3"  (160 cm) Weight: 66.4 kg (146 lb 6.2 oz) IBW/kg (Calculated) : 52.4 Heparin Dosing Weight: 66.4kg  Vital Signs: Temp: 98.4 F (36.9 C) (12/04 0300) Temp Source: Oral (12/04 0300) BP: 115/73 (12/04 0300) Pulse Rate: 77 (12/03 2350)  Labs: Recent Labs    06/10/20 1303 06/10/20 1303 06/10/20 1943 06/11/20 0333 06/12/20 0257  HGB 14.6   < >  --  13.5 14.6  HCT 41.9  --   --  39.7 41.4  PLT 287  --   --  207 203  LABPROT  --   --  25.0* 23.3* 18.4*  INR  --   --  2.4* 2.2* 1.6*  CREATININE 0.84  --   --  0.94  --    < > = values in this interval not displayed.    Estimated Creatinine Clearance: 40.1 mL/min (by C-G formula based on SCr of 0.94 mg/dL).   Medical History: Past Medical History:  Diagnosis Date  . Anxiety   . Borderline diabetes mellitus   . Cancer ([place]) 2020   breast  . Diverticulosis of colon   . DJD (degenerative joint disease)   . Dyslipidemia   . History of hematuria   . History of osteoporosis   . Hx of colonic polyps   . Hypertension   . Hypothyroid   . Macular degeneration    sees [name] for general eye care, sees [name] for retinal injections   . Memory loss   . Osteoporosis    took bisphosphonates, now off. Last DEXA in 2015 showed osteopenia   . Paroxysmal atrial fibrillation (HCC)    sees [name]   . SCCA (squamous cell carcinoma) of skin 12/11/2019   Left Hand Posterior (in situ)    Medications:  Scheduled:  . anastrozole  1 mg Oral Daily  . docusate sodium  100 mg Oral BID  . feeding supplement (GLUCERNA SHAKE)  237 mL Oral TID BM  . hydrochlorothiazide  25 mg Oral Daily  . insulin aspart  0-9 Units Subcutaneous TID WC  . losartan  100 mg Oral Daily  . memantine  10 mg Oral BID  .  multivitamin with minerals  1 tablet Oral Daily    Assessment: Patient is a 22 yof that is being admitted after passing out. Patient was found to have ankle fracture with plans for ortho taking the patient to the OR early next week for an ORIF. Patient takes warfarin at home for afib and pharmacy has been asked to bridge the patient with heparin while holding warfarin.  -INR= 2.2 -plans for vitamin K 5mg  po today per ortho  12/4 AM update:  Planning for heparin when INR is <2 INR is 1.6 this AM-will start heparin  Goal of Therapy:  Heparin level 0.3-0.7 units/ml Monitor platelets by anticoagulation protocol: Yes   Plan:  -Start heparin drip at 700 units/hr -1400 heparin level -Monitor for bleeding  Narda Bonds, PharmD, BCPS Clinical Pharmacist Phone: [phone]

## 2020-06-12 NOTE — Progress Notes (Signed)
PROGRESS NOTE    Cynthia Mathis  WUJ:[id number] DOB: [birth year] DOA: 06/10/2020 PCP: [name], MD     Brief Narrative:  Cynthia Mathis is an 86 y.o. female with medical history significant of hypertension, paroxysmal atrial fibrillation on Coumadin, diet-controlled diabetes mellitus type 2, and osteoporosis who presents after reportedly passing out at home.  Patient reports that she had gotten up early that morning and was talking with her houseguest in the kitchen.  She went and use the restroom and had a bowel movement.  The next thing she recalls was waking up on the floor with pain on her right ankle.  She is unsure of how long she was out and does not know if she hit her head.  Denies fever, chills, lightheadedness, palpitations, chest pain, shortness of breath, leg swelling, diarrhea, nausea, vomiting, dysuria, urinary frequency new medications, or any changes to her medications.  At baseline she lives alone, is able to walk without assistance, and complete all of her ADLs.  She does have to have company over at the time and they called 911.  Patient notes that normally her heart rates are higher than the 40s. She was found to have right bimalleolar fracture of the ankle. Due to bradycardia and syncopal episode, cardiology was consulted.  New events last 24 hours / Subjective: Patient went into A. fib RVR overnight. Low-dose Lopressor started by cardiology. This morning, she voices no complaints other than pain in her right ankle. Denies any chest pain or feeling heart palpitation  Assessment & Plan:   Principal Problem:   Closed displaced trimalleolar fracture of right ankle Active Problems:   Osteopenia   Paroxysmal atrial fibrillation (HCC)   Mild cognitive impairment   Leukocytosis   Bradycardia   Syncope   Right ankle fracture -Per orthopedic surgery  Syncope and bradycardia -Initially held metoprolol and Cardizem -Continue telemetry -Appreciate cardiology  following for surgical risk  UTI, present on admission -Empiric Rocephin. Urine culture pending  Paroxysmal atrial fibrillation -On Coumadin at home. Continue IV heparin prior to surgery -Now A. fib RVR and Lopressor restarted  Essential hypertension -Continue HCTZ/cozaar   Type 2 diabetes, well controlled -Hemoglobin A1c 7.4 -Sliding scale insulin  Mild cognitive impairment -Hold Aricept due to bradycardia -Continue namenda   History of breast cancer -Continue anastrozole  Hypothyroidism -Mildly elevated free T4 1.13, low TSH 0.319. Repeat labs as outpatient in 4-6 weeks   DVT prophylaxis: IV heparin. Hold Coumadin   Code Status: Full code Family Communication: None at bedside Disposition Plan:  Status is: Inpatient  Remains inpatient appropriate because:Ongoing diagnostic testing needed not appropriate for outpatient work up   Dispo: The patient is from: Home              Anticipated d/c is to: To be determined              Anticipated d/c date is: > 3 days              Patient currently is not medically stable to d/c. Continue monitoring bradycardia and A. fib RVR on telemetry. Await cardiology recommendations/surgical clearance for her right ankle fracture.      Consultants:   Orthopedic surgery  Cardiology  Procedures:   None  Antimicrobials:  Anti-infectives (From admission, onward)   Start     Dose/Rate Route Frequency Ordered Stop   06/10/20 1900  cefTRIAXone (ROCEPHIN) 1 g in sodium chloride 0.9 % 100 mL IVPB  1 g 200 mL/hr over 30 Minutes Intravenous Every 24 hours 06/10/20 1855         Objective: Vitals:   06/12/20 0736 06/12/20 0747 06/12/20 1004 06/12/20 1122  BP: (!) 154/82  (!) 133/98 (!) 110/59  Pulse: 92 (!) 139 (!) 151 60  Resp: 18   18  Temp: 97.6 F (36.4 C) 98.5 F (36.9 C)  97.9 F (36.6 C)  TempSrc: Oral Oral  Oral  SpO2: 96% 96%  96%  Weight:      Height:        Intake/Output Summary (Last 24 hours) at  06/12/2020 1216 Last data filed at 06/12/2020 0500 Gross per 24 hour  Intake 0 ml  Output 800 ml  Net -800 ml   Filed Weights   06/11/20 0904 06/12/20 0430  Weight: 66.4 kg 65.8 kg    Examination: General exam: Appears calm and comfortable  Respiratory system: Clear to auscultation. Respiratory effort normal. Cardiovascular system: S1 & S2 heard, irregular rhythm, rate 110-130 Gastrointestinal system: Abdomen is nondistended, soft and nontender. Normal bowel sounds heard. Central nervous system: Alert and oriented. Non focal exam. Speech clear  Skin: No rashes, lesions or ulcers on exposed skin  Psychiatry: Judgement and insight appear stable. Mood & affect appropriate.    Data Reviewed: I have personally reviewed following labs and imaging studies  CBC: Recent Labs  Lab 06/10/20 1303 06/11/20 0333 06/12/20 0257  WBC 15.0* 13.9* 14.7*  NEUTROABS 11.1*  --   --   HGB 14.6 13.5 14.6  HCT 41.9 39.7 41.4  MCV 92.3 93.0 90.2  PLT 287 207 347   Basic Metabolic Panel: Recent Labs  Lab 06/10/20 1303 06/11/20 0333  NA 139 140  K 4.6 3.6  CL 101 103  CO2 23 23  GLUCOSE 160* 173*  BUN 17 18  CREATININE 0.84 0.94  CALCIUM 9.4 9.2   GFR: Estimated Creatinine Clearance: 39.9 mL/min (by C-G formula based on SCr of 0.94 mg/dL). Liver Function Tests: No results for input(s): AST, ALT, ALKPHOS, BILITOT, PROT, ALBUMIN in the last 168 hours. No results for input(s): LIPASE, AMYLASE in the last 168 hours. No results for input(s): AMMONIA in the last 168 hours. Coagulation Profile: Recent Labs  Lab 06/10/20 1943 06/11/20 0333 06/12/20 0257  INR 2.4* 2.2* 1.6*   Cardiac Enzymes: No results for input(s): CKTOTAL, CKMB, CKMBINDEX, TROPONINI in the last 168 hours. BNP (last 3 results) No results for input(s): PROBNP in the last 8760 hours. HbA1C: Recent Labs    06/11/20 0333  HGBA1C 7.4*   CBG: Recent Labs  Lab 06/11/20 1622 06/11/20 2231 06/12/20 0734  06/12/20 0917 06/12/20 1121  GLUCAP 200* 242* 194* 213* 250*   Lipid Profile: No results for input(s): CHOL, HDL, LDLCALC, TRIG, CHOLHDL, LDLDIRECT in the last 72 hours. Thyroid Function Tests: Recent Labs    06/11/20 0333  TSH 0.319*  FREET4 1.13*   Anemia Panel: No results for input(s): VITAMINB12, FOLATE, FERRITIN, TIBC, IRON, RETICCTPCT in the last 72 hours. Sepsis Labs: No results for input(s): PROCALCITON, LATICACIDVEN in the last 168 hours.  Recent Results (from the past 240 hour(s))  Resp Panel by RT-PCR (Flu A&B, Covid) Nasopharyngeal Swab     Status: None   Collection Time: 06/10/20  3:18 PM   Specimen: Nasopharyngeal Swab; Nasopharyngeal(NP) swabs in vial transport medium  Result Value Ref Range Status   SARS Coronavirus 2 by RT PCR NEGATIVE NEGATIVE Final    Comment: (NOTE) SARS-CoV-2 target nucleic acids are  NOT DETECTED.  The SARS-CoV-2 RNA is generally detectable in upper respiratory specimens during the acute phase of infection. The lowest concentration of SARS-CoV-2 viral copies this assay can detect is 138 copies/mL. A negative result does not preclude SARS-Cov-2 infection and should not be used as the sole basis for treatment or other patient management decisions. A negative result may occur with  improper specimen collection/handling, submission of specimen other than nasopharyngeal swab, presence of viral mutation(s) within the areas targeted by this assay, and inadequate number of viral copies(<138 copies/mL). A negative result must be combined with clinical observations, patient history, and epidemiological information. The expected result is Negative.  Fact Sheet for Patients:  EntrepreneurPulse.com.au  Fact Sheet for Healthcare Providers:  IncredibleEmployment.be  This test is no t yet approved or cleared by the [place] FDA and  has been authorized for detection and/or diagnosis of SARS-CoV-2 by FDA  under an Emergency Use Authorization (EUA). This EUA will remain  in effect (meaning this test can be used) for the duration of the COVID-19 declaration under Section 564(b)(1) of the Act, 21 U.S.C.section 360bbb-3(b)(1), unless the authorization is terminated  or revoked sooner.       Influenza A by PCR NEGATIVE NEGATIVE Final   Influenza B by PCR NEGATIVE NEGATIVE Final    Comment: (NOTE) The Xpert Xpress SARS-CoV-2/FLU/RSV plus assay is intended as an aid in the diagnosis of influenza from Nasopharyngeal swab specimens and should not be used as a sole basis for treatment. Nasal washings and aspirates are unacceptable for Xpert Xpress SARS-CoV-2/FLU/RSV testing.  Fact Sheet for Patients: EntrepreneurPulse.com.au  Fact Sheet for Healthcare Providers: IncredibleEmployment.be  This test is not yet approved or cleared by the [place] FDA and has been authorized for detection and/or diagnosis of SARS-CoV-2 by FDA under an Emergency Use Authorization (EUA). This EUA will remain in effect (meaning this test can be used) for the duration of the COVID-19 declaration under Section 564(b)(1) of the Act, 21 U.S.C. section 360bbb-3(b)(1), unless the authorization is terminated or revoked.  Performed at [facility], [place] [address] [place], [place] [address]   Culture, Urine     Status: Abnormal (Preliminary result)   Collection Time: 06/10/20  3:18 PM   Specimen: Urine, Random  Result Value Ref Range Status   Specimen Description URINE, RANDOM  Final   Special Requests   Final    NONE Performed at [facility], [place] [address] [place], [place] [address]    Culture >=100,000 COLONIES/mL GRAM NEGATIVE RODS (A)  Final   Report Status PENDING  Incomplete  Surgical PCR screen     Status: None   Collection Time: 06/11/20  3:11 PM   Specimen: Nasal Mucosa; Nasal Swab  Result Value Ref Range Status   MRSA, PCR NEGATIVE NEGATIVE Final    Staphylococcus aureus NEGATIVE NEGATIVE Final    Comment: (NOTE) The Xpert SA Assay (FDA approved for NASAL specimens in patients 56 years of age and older), is one component of a comprehensive surveillance program. It is not intended to diagnose infection nor to guide or monitor treatment. Performed at [facility], [place] [address], [place], [place] [address]       Radiology Studies: DG Ankle Complete Right  Result Date: 06/10/2020 CLINICAL DATA:  Status post reduction EXAM: RIGHT ANKLE - COMPLETE 3+ VIEW COMPARISON:  06/10/2020 FINDINGS: Status post close reduction of bimalleolar fracture. The fracture fragments are in near anatomic alignment. Overlying splinting material noted. IMPRESSION:  1. Status post closed reduction of bimalleolar fracture of the right lower extremity 2. Fracture fragments are in near anatomic alignment. Electronically Signed   By: Kerby Moors M.D.   On: 06/10/2020 14:20   CT ANKLE RIGHT WO CONTRAST  Result Date: 06/10/2020 CLINICAL DATA:  The patient suffered right ankle fractures in a fall today. Initial encounter. EXAM: CT OF THE RIGHT ANKLE WITHOUT CONTRAST TECHNIQUE: Multidetector CT imaging of the right ankle was performed according to the standard protocol. Multiplanar CT image reconstructions were also generated. COMPARISON:  Plain films right ankle earlier today. FINDINGS: Bones/Joint/Cartilage Acute fracture of the posterior malleolus is seen. The fracture fragment is triangular in shape with the base of the triangle adjacent to the fibula. Fracture fragment measures up to 0.7 cm AP by 2.3 cm transverse at the plafond by 2 cm craniocaudal. The fracture fragment demonstrates slight posterior and lateral displacement. Also seen is a fracture of the medial malleolus. The distal fragment is inferiorly displaced approximately 0.6 cm and demonstrates slight medial displacement. The patient has an acute diaphyseal fracture of the distal fibula. The fracture  originates approximately 6.5 cm above the tip of the lateral malleolus. The fracture is mildly comminuted with 3 fragments identified. There is slight fragment override and mild lateral angulation of the distal fragment. Also seen is an avulsion fracture off of the anterior aspect of the lateral malleolus where a fracture fragment measuring approximately 0.6 cm in diameter is anteriorly displaced approximately 0.3 cm. Ligaments Suboptimally assessed by CT. Avulsion fracture off the anterior aspect of the lateral malleolus is at the expected attachment site of the anterior, inferior tibiofibular ligament. As visualized by CT scan, ligamentous structures about the ankle are otherwise unremarkable. Muscles and Tendons Appear intact.  No tendon entrapment. Soft tissues Soft tissue contusion and hematoma about the patient's fractures noted. IMPRESSION: Ankle fractures most consistent with a Weber C4 injury. Although the anterior inferior tibiofibular ligament is likely intact, there is an avulsion fracture at its attachment site to the fibula. Electronically Signed   By: Inge Rise M.D.   On: 06/10/2020 17:36   Chest Portable 1 View  Result Date: 06/10/2020 CLINICAL DATA:  Syncope, bradycardia EXAM: PORTABLE CHEST 1 VIEW COMPARISON:  04/19/2020 FINDINGS: The heart size and mediastinal contours are within normal limits. Both lungs are clear. The visualized skeletal structures are unremarkable. IMPRESSION: No active disease. Electronically Signed   By: Randa Ngo M.D.   On: 06/10/2020 16:39   ECHOCARDIOGRAM COMPLETE  Result Date: 06/11/2020    ECHOCARDIOGRAM REPORT   Patient Name:   MIRIYA CLOER Date of Exam: 06/11/2020 Medical Rec #:  [id number]         Height:       63.0 in Accession #:    [id number]        Weight:       146.4 lb Date of Birth:  1933-07-02          BSA:          1.693 m Patient Age:    78 years          BP:           142/60 mmHg Patient Gender: F                 HR:           57 bpm.  Exam Location:  Inpatient Procedure: 2D Echo, Cardiac Doppler and Color Doppler Indications:    R55 Syncope  History:  Patient has prior history of Echocardiogram examinations, most                 recent 09/11/2016. Arrythmias:Atrial Fibrillation; Risk                 Factors:Hypertension and Dyslipidemia. Cancer. Hypothyroidism.  Sonographer:    Tiffany Dance Referring Phys: [id number] [place]  1. Left ventricular ejection fraction, by estimation, is 65 to 70%. The left ventricle has normal function. The left ventricle has no regional wall motion abnormalities. Left ventricular diastolic parameters are indeterminate.  2. Right ventricular systolic function is normal. The right ventricular size is normal. There is normal pulmonary artery systolic pressure.  3. The mitral valve is normal in structure. No evidence of mitral valve regurgitation. No evidence of mitral stenosis.  4. The aortic valve is normal in structure. Aortic valve regurgitation is trivial. No aortic stenosis is present. FINDINGS  Left Ventricle: Left ventricular ejection fraction, by estimation, is 65 to 70%. The left ventricle has normal function. The left ventricle has no regional wall motion abnormalities. The left ventricular internal cavity size was normal in size. There is  no left ventricular hypertrophy. Left ventricular diastolic parameters are indeterminate. Right Ventricle: The right ventricular size is normal. No increase in right ventricular wall thickness. Right ventricular systolic function is normal. There is normal pulmonary artery systolic pressure. The tricuspid regurgitant velocity is 2.77 m/s, and  with an assumed right atrial pressure of 3 mmHg, the estimated right ventricular systolic pressure is 53.2 mmHg. Left Atrium: Left atrial size was normal in size. Right Atrium: Right atrial size was normal in size. Pericardium: There is no evidence of pericardial effusion. Mitral Valve: The mitral valve is  normal in structure. No evidence of mitral valve regurgitation. No evidence of mitral valve stenosis. Tricuspid Valve: The tricuspid valve is normal in structure. Tricuspid valve regurgitation is trivial. Aortic Valve: The aortic valve is normal in structure. Aortic valve regurgitation is trivial. No aortic stenosis is present. Pulmonic Valve: The pulmonic valve was normal in structure. Pulmonic valve regurgitation is not visualized. Aorta: The aortic root and ascending aorta are structurally normal, with no evidence of dilitation. IAS/Shunts: The atrial septum is grossly normal.  LEFT VENTRICLE PLAX 2D LVIDd:         3.90 cm  Diastology LVIDs:         2.10 cm  LV e' medial:    4.46 cm/s LV PW:         1.20 cm  LV E/e' medial:  14.2 LV IVS:        1.00 cm  LV e' lateral:   5.44 cm/s LVOT diam:     2.00 cm  LV E/e' lateral: 11.7 LV SV:         96 LV SV Index:   57 LVOT Area:     3.14 cm  RIGHT VENTRICLE             IVC RV Basal diam:  2.50 cm     IVC diam: 1.50 cm RV S prime:     18.30 cm/s TAPSE (M-mode): 1.8 cm LEFT ATRIUM           Index       RIGHT ATRIUM          Index LA diam:      3.00 cm 1.77 cm/m  RA Area:     9.60 cm LA Vol (A2C): 39.0 ml 23.03 ml/m RA Volume:  16.20 ml 9.57 ml/m LA Vol (A4C): 32.3 ml 19.07 ml/m  AORTIC VALVE LVOT Vmax:   140.00 cm/s LVOT Vmean:  88.500 cm/s LVOT VTI:    0.307 m  AORTA Ao Root diam: 3.60 cm Ao Asc diam:  3.00 cm MITRAL VALVE               TRICUSPID VALVE MV Area (PHT): 2.11 cm    TR Peak grad:   30.7 mmHg MV Decel Time: 359 msec    TR Vmax:        277.00 cm/s MV E velocity: 63.40 cm/s MV A velocity: 96.80 cm/s  SHUNTS MV E/A ratio:  0.65        Systemic VTI:  0.31 m                            Systemic Diam: 2.00 cm Mertie Moores MD Electronically signed by Mertie Moores MD Signature Date/Time: 06/11/2020/12:55:12 PM    Final       Scheduled Meds: . anastrozole  1 mg Oral Daily  . docusate sodium  100 mg Oral BID  . feeding supplement (GLUCERNA SHAKE)  237 mL  Oral TID BM  . hydrochlorothiazide  25 mg Oral Daily  . insulin aspart  0-9 Units Subcutaneous TID WC  . losartan  100 mg Oral Daily  . memantine  10 mg Oral BID  . metoprolol tartrate  25 mg Oral Q6H  . multivitamin with minerals  1 tablet Oral Daily   Continuous Infusions: . sodium chloride 250 mL (06/11/20 1701)  . cefTRIAXone (ROCEPHIN)  IV 1 g (06/11/20 1813)  . heparin 700 Units/hr (06/12/20 0548)     LOS: 2 days      Time spent: 25 minutes   Dessa Phi, DO [facility] 06/12/2020, 12:16 PM   Available via Epic secure chat 7am-7pm After these hours, please refer to coverage provider listed on [email or web address]

## 2020-06-12 NOTE — Progress Notes (Signed)
Pt converted to SR with frequent PACs.  Occasionally goes into non sustained SVT in 190s. BP stable.  Idolina Primer, RN

## 2020-06-12 NOTE — Progress Notes (Signed)
   06/11/20 2138  Assess: MEWS Score  Temp 98.6 F (37 C)  BP (!) 153/76  Pulse Rate (!) 150  ECG Heart Rate (!) 150  Resp 20  Level of Consciousness Alert  SpO2 94 %  O2 Device Room Air  Assess: MEWS Score  MEWS Temp 0  MEWS Systolic 0  MEWS Pulse 3  MEWS RR 0  MEWS LOC 0  MEWS Score 3  MEWS Score Color Yellow  Assess: if the MEWS score is Yellow or Red  Were vital signs taken at a resting state? Yes  Focused Assessment Change from prior assessment (see assessment flowsheet)  Early Detection of Sepsis Score *See Row Information* Medium  MEWS guidelines implemented *See Row Information* Yes  Take Vital Signs  Increase Vital Sign Frequency  Yellow: Q 2hr X 2 then Q 4hr X 2, if remains yellow, continue Q 4hrs  Escalate  MEWS: Escalate Yellow: discuss with charge nurse/RN and consider discussing with provider and RRT  Notify: Charge Nurse/RN  Name of Charge Nurse/RN Notified Zalan Shidler RN  Date Charge Nurse/RN Notified 06/11/20  Time Charge Nurse/RN Notified  (2138)  Notify: Provider  Provider Name/Title Dr. Clearence Ped  Date Provider Notified 06/11/20  Time Provider Notified 2138  Notification Type Page  Notification Reason Change in status  Response See new orders (EKG, adminster Metoprolol IV)  Date of Provider Response 06/11/20  Time of Provider Response 2147  Document  Patient Outcome Other (Comment) (pt stable and remains on unit )  Progress note created (see row info) Yes

## 2020-06-12 NOTE — Progress Notes (Signed)
[place] for Heparin when INR is <2 Indication: atrial fibrillation  Allergies  Allergen Reactions  . Epinephrine Palpitations    heart racing    Patient Measurements: Height: 5\' 3"  (160 cm) Weight: 65.8 kg (145 lb) IBW/kg (Calculated) : 52.4 Heparin Dosing Weight: 66.4kg  Vital Signs: Temp: 97.9 F (36.6 C) (12/04 1122) Temp Source: Oral (12/04 1122) BP: 110/59 (12/04 1122) Pulse Rate: 60 (12/04 1122)  Labs: Recent Labs    06/10/20 1303 06/10/20 1303 06/10/20 1943 06/11/20 0333 06/12/20 0257 06/12/20 1416  HGB 14.6   < >  --  13.5 14.6  --   HCT 41.9  --   --  39.7 41.4  --   PLT 287  --   --  207 203  --   LABPROT  --   --  25.0* 23.3* 18.4*  --   INR  --   --  2.4* 2.2* 1.6*  --   HEPARINUNFRC  --   --   --   --   --  <0.10*  CREATININE 0.84  --   --  0.94  --   --    < > = values in this interval not displayed.    Estimated Creatinine Clearance: 39.9 mL/min (by C-G formula based on SCr of 0.94 mg/dL).   Medical History: Past Medical History:  Diagnosis Date  . Anxiety   . Borderline diabetes mellitus   . Cancer ([place]) 2020   breast  . Diverticulosis of colon   . DJD (degenerative joint disease)   . Dyslipidemia   . History of hematuria   . History of osteoporosis   . Hx of colonic polyps   . Hypertension   . Hypothyroid   . Macular degeneration    sees [name] for general eye care, sees [name] for retinal injections   . Memory loss   . Osteoporosis    took bisphosphonates, now off. Last DEXA in 2015 showed osteopenia   . Paroxysmal atrial fibrillation (HCC)    sees [name]   . SCCA (squamous cell carcinoma) of skin 12/11/2019   Left Hand Posterior (in situ)    Medications:  Scheduled:  . anastrozole  1 mg Oral Daily  . docusate sodium  100 mg Oral BID  . feeding supplement (GLUCERNA SHAKE)  237 mL Oral TID BM  . hydrochlorothiazide  25 mg Oral Daily  . insulin aspart  0-9 Units  Subcutaneous TID WC  . losartan  100 mg Oral Daily  . memantine  10 mg Oral BID  . metoprolol tartrate  25 mg Oral Q6H  . multivitamin with minerals  1 tablet Oral Daily    Assessment: Patient is a 48 yof that is being admitted after passing out. Patient was found to have ankle fracture with plans for ortho taking the patient to the OR early next week for an ORIF. Patient takes warfarin at home for afib and pharmacy has been asked to bridge the patient with heparin while holding warfarin.   Heparin initiated this AM when INR <2 at 700 units/hr, initial heparin level undetectable, rate confirmed and no infusion issues  Goal of Therapy:  Heparin level 0.3-0.7 units/ml Monitor platelets by anticoagulation protocol: Yes   Plan:  Increase heparin gtt to 850 units/hr F/u 8 hour heparin level  Bertis Ruddy, PharmD Clinical Pharmacist Please check AMION for all [place] numbers 06/12/2020 2:52 PM

## 2020-06-12 NOTE — Progress Notes (Signed)
Progress Note  Patient Name: Cynthia Mathis Date of Encounter: 06/12/2020  Primary Cardiologist: Ena Dawley, MD   Subjective   Atrial fibrillation overnight. Rates elevated while I'm in the room.   Subsequently she has converted back to sinus rhythm with intermittent afib and SVT.  Inpatient Medications    Scheduled Meds: . anastrozole  1 mg Oral Daily  . docusate sodium  100 mg Oral BID  . feeding supplement (GLUCERNA SHAKE)  237 mL Oral TID BM  . hydrochlorothiazide  25 mg Oral Daily  . insulin aspart  0-9 Units Subcutaneous TID WC  . losartan  100 mg Oral Daily  . memantine  10 mg Oral BID  . metoprolol tartrate  25 mg Oral Q6H  . multivitamin with minerals  1 tablet Oral Daily   Continuous Infusions: . sodium chloride 250 mL (06/11/20 1701)  . cefTRIAXone (ROCEPHIN)  IV 1 g (06/11/20 1813)  . heparin 700 Units/hr (06/12/20 0548)   PRN Meds: sodium chloride, HYDROcodone-acetaminophen, metoprolol tartrate, morphine injection   Vital Signs    Vitals:   06/12/20 0430 06/12/20 0530 06/12/20 0736 06/12/20 0747  BP: 115/73 (!) 145/91 (!) 154/82   Pulse: (!) 151  92 (!) 139  Resp: 18  18   Temp: 97.7 F (36.5 C)  97.6 F (36.4 C) 98.5 F (36.9 C)  TempSrc: Oral  Oral Oral  SpO2: 96%  96% 96%  Weight: 65.8 kg     Height:        Intake/Output Summary (Last 24 hours) at 06/12/2020 0922 Last data filed at 06/12/2020 0500 Gross per 24 hour  Intake 240 ml  Output 800 ml  Net -560 ml   Filed Weights   06/11/20 0904 06/12/20 0430  Weight: 66.4 kg 65.8 kg    Telemetry    afib RVR rates 140s - Personally Reviewed  ECG    afib rvr - Personally Reviewed  Physical Exam   GEN: No acute distress.   Neck: No JVD Cardiac: irregular rhythm, tachycardic rate, no murmurs, rubs, or gallops.  Respiratory: Clear to auscultation bilaterally. GI: Soft, nontender, non-distended  MS: No edema; No deformity. Neuro:  Nonfocal  Psych: Normal affect   Labs     Chemistry Recent Labs  Lab 06/10/20 1303 06/11/20 0333  NA 139 140  K 4.6 3.6  CL 101 103  CO2 23 23  GLUCOSE 160* 173*  BUN 17 18  CREATININE 0.84 0.94  CALCIUM 9.4 9.2  GFRNONAA >60 59*  ANIONGAP 15 14     Hematology Recent Labs  Lab 06/10/20 1303 06/11/20 0333 06/12/20 0257  WBC 15.0* 13.9* 14.7*  RBC 4.54 4.27 4.59  HGB 14.6 13.5 14.6  HCT 41.9 39.7 41.4  MCV 92.3 93.0 90.2  MCH 32.2 31.6 31.8  MCHC 34.8 34.0 35.3  RDW 12.5 12.7 12.5  PLT 287 207 203    Cardiac EnzymesNo results for input(s): TROPONINI in the last 168 hours. No results for input(s): TROPIPOC in the last 168 hours.   BNPNo results for input(s): BNP, PROBNP in the last 168 hours.   DDimer No results for input(s): DDIMER in the last 168 hours.   Radiology    DG Ankle Complete Right  Result Date: 06/10/2020 CLINICAL DATA:  Status post reduction EXAM: RIGHT ANKLE - COMPLETE 3+ VIEW COMPARISON:  06/10/2020 FINDINGS: Status post close reduction of bimalleolar fracture. The fracture fragments are in near anatomic alignment. Overlying splinting material noted. IMPRESSION: 1. Status post closed reduction of bimalleolar  fracture of the right lower extremity 2. Fracture fragments are in near anatomic alignment. Electronically Signed   By: Kerby Moors M.D.   On: 06/10/2020 14:20   DG Ankle Complete Right  Result Date: 06/10/2020 CLINICAL DATA:  Right ankle pain after a fall. EXAM: RIGHT ANKLE - COMPLETE 3+ VIEW COMPARISON:  Foot radiographs dated 12/05/2017. FINDINGS: There is a comminuted fracture of the distal fibula at or above the level of the tibiofibular syndesmosis. No definite widening of the distal tibiofibular articulation. There is a mildly displaced fracture of the medial malleolus with subluxation of the talar dome laterally. A nondisplaced posterior malleolus fracture is difficult to exclude. There is surrounding soft tissue swelling. Plantar and posterior calcaneal enthesophytes are noted.  IMPRESSION: 1. Bimalleolar fracture with mild subluxation of the talar dome laterally. 2. Nondisplaced posterior malleolus fracture is difficult to exclude. Electronically Signed   By: Zerita Boers M.D.   On: 06/10/2020 11:25   CT Head Wo Contrast  Result Date: 06/10/2020 CLINICAL DATA:  Pain following fall.  Syncope. EXAM: CT HEAD WITHOUT CONTRAST TECHNIQUE: Contiguous axial images were obtained from the base of the skull through the vertex without intravenous contrast. COMPARISON:  None. FINDINGS: Brain: There is mild diffuse atrophy. There is no intracranial mass, hemorrhage, extra-axial fluid collection, or midline shift. There is mild small vessel disease in the centra semiovale bilaterally. Elsewhere brain parenchyma appears unremarkable. No acute infarct evident. Vascular: No hyperdense vessel. There is calcification in each carotid siphon region. Skull: Bony calvarium appears intact. Sinuses/Orbits: There is mucosal thickening in the left maxillary antrum. There is mucosal thickening in several ethmoid air cells. Orbits appear symmetric bilaterally. Other: Mastoid air cells are clear. IMPRESSION: Mild atrophy with mild periventricular small vessel disease. No acute infarct. No mass or hemorrhage. There are foci of arterial vascular calcification. There is mild mucosal thickening in several paranasal sinus regions. Electronically Signed   By: Lowella Grip III M.D.   On: 06/10/2020 11:48   CT ANKLE RIGHT WO CONTRAST  Result Date: 06/10/2020 CLINICAL DATA:  The patient suffered right ankle fractures in a fall today. Initial encounter. EXAM: CT OF THE RIGHT ANKLE WITHOUT CONTRAST TECHNIQUE: Multidetector CT imaging of the right ankle was performed according to the standard protocol. Multiplanar CT image reconstructions were also generated. COMPARISON:  Plain films right ankle earlier today. FINDINGS: Bones/Joint/Cartilage Acute fracture of the posterior malleolus is seen. The fracture fragment is  triangular in shape with the base of the triangle adjacent to the fibula. Fracture fragment measures up to 0.7 cm AP by 2.3 cm transverse at the plafond by 2 cm craniocaudal. The fracture fragment demonstrates slight posterior and lateral displacement. Also seen is a fracture of the medial malleolus. The distal fragment is inferiorly displaced approximately 0.6 cm and demonstrates slight medial displacement. The patient has an acute diaphyseal fracture of the distal fibula. The fracture originates approximately 6.5 cm above the tip of the lateral malleolus. The fracture is mildly comminuted with 3 fragments identified. There is slight fragment override and mild lateral angulation of the distal fragment. Also seen is an avulsion fracture off of the anterior aspect of the lateral malleolus where a fracture fragment measuring approximately 0.6 cm in diameter is anteriorly displaced approximately 0.3 cm. Ligaments Suboptimally assessed by CT. Avulsion fracture off the anterior aspect of the lateral malleolus is at the expected attachment site of the anterior, inferior tibiofibular ligament. As visualized by CT scan, ligamentous structures about the ankle are otherwise unremarkable. Muscles  and Tendons Appear intact.  No tendon entrapment. Soft tissues Soft tissue contusion and hematoma about the patient's fractures noted. IMPRESSION: Ankle fractures most consistent with a Weber C4 injury. Although the anterior inferior tibiofibular ligament is likely intact, there is an avulsion fracture at its attachment site to the fibula. Electronically Signed   By: Inge Rise M.D.   On: 06/10/2020 17:36   Chest Portable 1 View  Result Date: 06/10/2020 CLINICAL DATA:  Syncope, bradycardia EXAM: PORTABLE CHEST 1 VIEW COMPARISON:  04/19/2020 FINDINGS: The heart size and mediastinal contours are within normal limits. Both lungs are clear. The visualized skeletal structures are unremarkable. IMPRESSION: No active disease.  Electronically Signed   By: Randa Ngo M.D.   On: 06/10/2020 16:39   ECHOCARDIOGRAM COMPLETE  Result Date: 06/11/2020    ECHOCARDIOGRAM REPORT   Patient Name:   Cynthia Mathis Date of Exam: 06/11/2020 Medical Rec #:  [id number]         Height:       63.0 in Accession #:    [id number]        Weight:       146.4 lb Date of Birth:  [birth year]          BSA:          1.693 m Patient Age:    30 years          BP:           142/60 mmHg Patient Gender: F                 HR:           57 bpm. Exam Location:  Inpatient Procedure: 2D Echo, Cardiac Doppler and Color Doppler Indications:    R55 Syncope  History:        Patient has prior history of Echocardiogram examinations, most                 recent 09/11/2016. Arrythmias:Atrial Fibrillation; Risk                 Factors:Hypertension and Dyslipidemia. Cancer. Hypothyroidism.  Sonographer:    Tiffany Dance Referring Phys: [id number] Caroline  1. Left ventricular ejection fraction, by estimation, is 65 to 70%. The left ventricle has normal function. The left ventricle has no regional wall motion abnormalities. Left ventricular diastolic parameters are indeterminate.  2. Right ventricular systolic function is normal. The right ventricular size is normal. There is normal pulmonary artery systolic pressure.  3. The mitral valve is normal in structure. No evidence of mitral valve regurgitation. No evidence of mitral stenosis.  4. The aortic valve is normal in structure. Aortic valve regurgitation is trivial. No aortic stenosis is present. FINDINGS  Left Ventricle: Left ventricular ejection fraction, by estimation, is 65 to 70%. The left ventricle has normal function. The left ventricle has no regional wall motion abnormalities. The left ventricular internal cavity size was normal in size. There is  no left ventricular hypertrophy. Left ventricular diastolic parameters are indeterminate. Right Ventricle: The right ventricular size is normal. No increase in  right ventricular wall thickness. Right ventricular systolic function is normal. There is normal pulmonary artery systolic pressure. The tricuspid regurgitant velocity is 2.77 m/s, and  with an assumed right atrial pressure of 3 mmHg, the estimated right ventricular systolic pressure is 25.0 mmHg. Left Atrium: Left atrial size was normal in size. Right Atrium: Right atrial size was normal in size. Pericardium: There is no evidence of  pericardial effusion. Mitral Valve: The mitral valve is normal in structure. No evidence of mitral valve regurgitation. No evidence of mitral valve stenosis. Tricuspid Valve: The tricuspid valve is normal in structure. Tricuspid valve regurgitation is trivial. Aortic Valve: The aortic valve is normal in structure. Aortic valve regurgitation is trivial. No aortic stenosis is present. Pulmonic Valve: The pulmonic valve was normal in structure. Pulmonic valve regurgitation is not visualized. Aorta: The aortic root and ascending aorta are structurally normal, with no evidence of dilitation. IAS/Shunts: The atrial septum is grossly normal.  LEFT VENTRICLE PLAX 2D LVIDd:         3.90 cm  Diastology LVIDs:         2.10 cm  LV e' medial:    4.46 cm/s LV PW:         1.20 cm  LV E/e' medial:  14.2 LV IVS:        1.00 cm  LV e' lateral:   5.44 cm/s LVOT diam:     2.00 cm  LV E/e' lateral: 11.7 LV SV:         96 LV SV Index:   57 LVOT Area:     3.14 cm  RIGHT VENTRICLE             IVC RV Basal diam:  2.50 cm     IVC diam: 1.50 cm RV S prime:     18.30 cm/s TAPSE (M-mode): 1.8 cm LEFT ATRIUM           Index       RIGHT ATRIUM          Index LA diam:      3.00 cm 1.77 cm/m  RA Area:     9.60 cm LA Vol (A2C): 39.0 ml 23.03 ml/m RA Volume:   16.20 ml 9.57 ml/m LA Vol (A4C): 32.3 ml 19.07 ml/m  AORTIC VALVE LVOT Vmax:   140.00 cm/s LVOT Vmean:  88.500 cm/s LVOT VTI:    0.307 m  AORTA Ao Root diam: 3.60 cm Ao Asc diam:  3.00 cm MITRAL VALVE               TRICUSPID VALVE MV Area (PHT): 2.11 cm     TR Peak grad:   30.7 mmHg MV Decel Time: 359 msec    TR Vmax:        277.00 cm/s MV E velocity: 63.40 cm/s MV A velocity: 96.80 cm/s  SHUNTS MV E/A ratio:  0.65        Systemic VTI:  0.31 m                            Systemic Diam: 2.00 cm Mertie Moores MD Electronically signed by Mertie Moores MD Signature Date/Time: 06/11/2020/12:55:12 PM    Final     Cardiac Studies   IMPRESSIONS    1. Left ventricular ejection fraction, by estimation, is 65 to 70%. The  left ventricle has normal function. The left ventricle has no regional  wall motion abnormalities. Left ventricular diastolic parameters are  indeterminate.  2. Right ventricular systolic function is normal. The right ventricular  size is normal. There is normal pulmonary artery systolic pressure.  3. The mitral valve is normal in structure. No evidence of mitral valve  regurgitation. No evidence of mitral stenosis.  4. The aortic valve is normal in structure. Aortic valve regurgitation is  trivial. No aortic stenosis is present.   Patient Profile  86 y.o. female with diabetes, hyperlipidemia, hypertension, PAF, breast cancer who presents to the hospital with an episode of syncope leading to a right ankle fracture.  Assessment & Plan   Principal Problem:   Closed displaced trimalleolar fracture of right ankle Active Problems:   Osteopenia   Paroxysmal atrial fibrillation (HCC)   Mild cognitive impairment   Leukocytosis   Bradycardia   Syncope   Grossly normal echo with hyperdynamic function which may be why she had syncope from underfilled ventricle. Encourage hydration.   Unfortunately she had rapid atrial fibrillation overnight. We will titrate beta blockade. Challenging situation given presentation with bradycardia.   Final comments about cardiovascular preoperative risk pending overnight telemetry review tonight.     For questions or updates, please contact [facility] Please consult [email or web address] for  contact info under        Signed, Elouise Munroe, MD  06/12/2020, 9:22 AM

## 2020-06-12 NOTE — Progress Notes (Signed)
    RN called because pt went into rapid Afib overnight.  Prev bradycardia >> home Toprol XL 100 mg and prn Cardizem 30 mg tabs d/c'd.  Will start Lopressor 12.5 mg q 6 h w/ prn IV Lopressor 5 mg and see how tolerated.   Rosaria Ferries, PA-C 06/12/2020 7:38 AM

## 2020-06-13 DIAGNOSIS — R55 Syncope and collapse: Secondary | ICD-10-CM | POA: Diagnosis not present

## 2020-06-13 DIAGNOSIS — S82851A Displaced trimalleolar fracture of right lower leg, initial encounter for closed fracture: Secondary | ICD-10-CM | POA: Diagnosis not present

## 2020-06-13 DIAGNOSIS — R001 Bradycardia, unspecified: Secondary | ICD-10-CM | POA: Diagnosis not present

## 2020-06-13 LAB — GLUCOSE, CAPILLARY
Glucose-Capillary: 152 mg/dL — ABNORMAL HIGH (ref 70–99)
Glucose-Capillary: 256 mg/dL — ABNORMAL HIGH (ref 70–99)
Glucose-Capillary: 185 mg/dL — ABNORMAL HIGH (ref 70–99)
Glucose-Capillary: 243 mg/dL — ABNORMAL HIGH (ref 70–99)

## 2020-06-13 LAB — CBC
HCT: 39.8 % (ref 36.0–46.0)
Hemoglobin: 13.7 g/dL (ref 12.0–15.0)
MCH: 31.3 pg (ref 26.0–34.0)
MCHC: 34.4 g/dL (ref 30.0–36.0)
MCV: 90.9 fL (ref 80.0–100.0)
Platelets: 223 10*3/uL (ref 150–400)
RBC: 4.38 MIL/uL (ref 3.87–5.11)
RDW: 12.4 % (ref 11.5–15.5)
WBC: 15.3 10*3/uL — ABNORMAL HIGH (ref 4.0–10.5)
nRBC: 0 % (ref 0.0–0.2)

## 2020-06-13 LAB — PROTIME-INR
INR: 1.3 — ABNORMAL HIGH (ref 0.8–1.2)
Prothrombin Time: 15.3 seconds — ABNORMAL HIGH (ref 11.4–15.2)

## 2020-06-13 LAB — URINE CULTURE: Culture: >=100000 — AB

## 2020-06-13 LAB — HEPARIN LEVEL (UNFRACTIONATED)
Heparin Unfractionated: <0.1 IU/mL — ABNORMAL LOW (ref 0.30–0.70)
Heparin Unfractionated: 0.52 IU/mL (ref 0.30–0.70)

## 2020-06-13 MED ORDER — CEFAZOLIN SODIUM-DEXTROSE 2-4 GM/100ML-% IV SOLN
2.0000 g | Freq: Once | INTRAVENOUS | Status: AC
  Administered 2020-06-14: 2 g via INTRAVENOUS
  Filled 2020-06-13 (×2): qty 100

## 2020-06-13 MED ORDER — CEPHALEXIN 500 MG PO CAPS
500.0000 mg | ORAL_CAPSULE | Freq: Four times a day (QID) | ORAL | Status: DC
  Administered 2020-06-13 – 2020-06-15 (×6): 500 mg via ORAL
  Filled 2020-06-13 (×4): qty 1

## 2020-06-13 NOTE — Progress Notes (Signed)
[facility] for Heparin when INR is <2 Indication: atrial fibrillation  Allergies  Allergen Reactions  . Epinephrine Palpitations    heart racing    Patient Measurements: Height: 5\' 3"  (160 cm) Weight: 65.8 kg (145 lb) IBW/kg (Calculated) : 52.4 Heparin Dosing Weight: 66.4kg  Vital Signs: Temp: 97.8 F (36.6 C) (12/05 0818) Temp Source: Oral (12/05 0818) BP: 136/62 (12/05 0818) Pulse Rate: 59 (12/05 0818)  Labs: Recent Labs    06/10/20 1303 06/10/20 1943 06/11/20 0333 06/11/20 0333 06/12/20 0257 06/12/20 1416 06/12/20 2259 06/13/20 0330 06/13/20 0925  HGB 14.6  --  13.5   < > 14.6  --   --  13.7  --   HCT 41.9  --  39.7  --  41.4  --   --  39.8  --   PLT 287  --  207  --  203  --   --  223  --   LABPROT  --    < > 23.3*  --  18.4*  --   --  15.3*  --   INR  --    < > 2.2*  --  1.6*  --   --  1.3*  --   HEPARINUNFRC  --   --   --   --   --  <0.10* <0.10*  --  0.52  CREATININE 0.84  --  0.94  --   --   --   --   --   --    < > = values in this interval not displayed.    Estimated Creatinine Clearance: 39.9 mL/min (by C-G formula based on SCr of 0.94 mg/dL).   Medical History: Past Medical History:  Diagnosis Date  . Anxiety   . Borderline diabetes mellitus   . Cancer ([place]) 2020   breast  . Diverticulosis of colon   . DJD (degenerative joint disease)   . Dyslipidemia   . History of hematuria   . History of osteoporosis   . Hx of colonic polyps   . Hypertension   . Hypothyroid   . Macular degeneration    sees [name] for general eye care, sees [name] for retinal injections   . Memory loss   . Osteoporosis    took bisphosphonates, now off. Last DEXA in 2015 showed osteopenia   . Paroxysmal atrial fibrillation (HCC)    sees [name]   . SCCA (squamous cell carcinoma) of skin 12/11/2019   Left Hand Posterior (in situ)    Medications:  Scheduled:  . anastrozole  1 mg Oral Daily  . docusate  sodium  100 mg Oral BID  . feeding supplement (GLUCERNA SHAKE)  237 mL Oral TID BM  . hydrochlorothiazide  25 mg Oral Daily  . insulin aspart  0-9 Units Subcutaneous TID WC  . losartan  100 mg Oral Daily  . memantine  10 mg Oral BID  . metoprolol tartrate  25 mg Oral BID  . multivitamin with minerals  1 tablet Oral Daily    Assessment: Patient is a 87 yof that is being admitted after passing out. Patient was found to have ankle fracture with plans for ortho taking the patient to the OR early next week for an ORIF. Patient takes warfarin at home for afib and pharmacy has been asked to bridge the patient with heparin while holding warfarin.   Heparin level therapeutic s/p rate increase to 1000 units/hr  Goal of Therapy:  Heparin  level 0.3-0.7 units/ml Monitor platelets by anticoagulation protocol: Yes   Plan:  Continue heparin gtt at 1000 units/hr Daily heparin level, CBC, s/s bleeding F/u transition back to warfarin post-op  Cynthia Mathis, PharmD Clinical Pharmacist Please check AMION for all [facility] numbers 06/13/2020 10:52 AM

## 2020-06-13 NOTE — Progress Notes (Signed)
Patient is now optimized for surgery.  I have scheduled her surgery for Monday afternoon.  IV heparin will be turned off 4 hrs before surgery.  I explained surgery with the son and all questions answered.  Informed consent obtained.  NPO after midnight.  Azucena Cecil, MD [facility] [phone] 6:54 PM

## 2020-06-13 NOTE — Progress Notes (Signed)
   Follow-Up Visit   Subjective  Cynthia Mathis is a 86 y.o. female who presents for the following: Follow-up (here for follow up on both hands tx 5FU-healing okay no concerns).  Persistent crusts Location: Arms Duration:  Quality:  Associated Signs/Symptoms: Modifying Factors:  Severity:  Timing: Context: History of skin cancer  Objective  Well appearing patient in no apparent distress; mood and affect are within normal limits.  A focused examination was performed including Head, neck, arms, hands.. Relevant physical exam findings are noted in the Assessment and Plan.   Assessment & Plan    History of squamous cell carcinoma of skin (2) Left Dorsal Hand; Right Dorsal Hand  Annual skin exam.  AK (actinic keratosis) (16) Left Wrist - Posterior; Right Wrist - Posterior (3); Left Dorsal Hand; Right Proximal Thumb; Left Proximal 2nd Finger; Right 2nd Finger Metacarpophalangeal Joint; Left 3rd Finger Metacarpophalangeal Joint; Left 4th Finger Metacarpophalangeal Joint; Left 5th Finger Metacarpophalangeal Joint; Right 5th Finger Metacarpophalangeal Joint; Left Zygomatic Area; Left Posterior Mandible; Left Anterior Neck; Right Forehead  Destruction of lesion - Left 3rd Finger Metacarpophalangeal Joint, Left 4th Finger Metacarpophalangeal Joint, Left 5th Finger Metacarpophalangeal Joint, Left Anterior Neck, Left Dorsal Hand, Left Posterior Mandible, Left Proximal 2nd Finger, Left Wrist - Posterior, Left Zygomatic Area, Right 2nd Finger Metacarpophalangeal Joint, Right 5th Finger Metacarpophalangeal Joint, Right Forehead, Right Proximal Thumb, Right Wrist - Posterior (3) Complexity: simple   Destruction method: cryotherapy   Informed consent: discussed and consent obtained   Timeout:  patient name, date of birth, surgical site, and procedure verified Lesion destroyed using liquid nitrogen: Yes   Cryotherapy cycles:  5 Outcome: patient tolerated procedure well with no complications     Post-procedure details: wound care instructions given       I, Lavonna Monarch, MD, have reviewed all documentation for this visit.  The documentation on 06/13/20 for the exam, diagnosis, procedures, and orders are all accurate and complete.

## 2020-06-13 NOTE — Progress Notes (Signed)
PROGRESS NOTE    Cynthia Mathis  ION:[id number] DOB: 1933-03-11 DOA: 06/10/2020 PCP: [name], MD     Brief Narrative:  Cynthia Mathis is an 86 y.o. female with medical history significant of hypertension, paroxysmal atrial fibrillation on Coumadin, diet-controlled diabetes mellitus type 2, and osteoporosis who presents after reportedly passing out at home.  Patient reports that she had gotten up early that morning and was talking with her houseguest in the kitchen.  She went and use the restroom and had a bowel movement.  The next thing she recalls was waking up on the floor with pain on her right ankle.  She is unsure of how long she was out and does not know if she hit her head.  Denies fever, chills, lightheadedness, palpitations, chest pain, shortness of breath, leg swelling, diarrhea, nausea, vomiting, dysuria, urinary frequency new medications, or any changes to her medications.  At baseline she lives alone, is able to walk without assistance, and complete all of her ADLs.  She does have to have company over at the time and they called 911.  Patient notes that normally her heart rates are higher than the 40s. She was found to have right bimalleolar fracture of the ankle. Due to bradycardia and syncopal episode, cardiology was consulted.  New events last 24 hours / Subjective: Now in normal sinus rhythm, rate 60s.  Patient voices no new symptoms or concerns.  Assessment & Plan:   Principal Problem:   Closed displaced trimalleolar fracture of right ankle Active Problems:   Osteopenia   Paroxysmal atrial fibrillation (HCC)   Mild cognitive impairment   Leukocytosis   Bradycardia   Syncope   Right ankle fracture -Per orthopedic surgery  Syncope and bradycardia -Initially held metoprolol and Cardizem -Continue telemetry -Appreciate cardiology following for surgical risk, please see Dr. Delphina Cahill note  E. coli UTI , present on admission -De-escalate to  Keflex  Paroxysmal atrial fibrillation -On Coumadin at home. Continue IV heparin prior to surgery -Had A. fib RVR and Lopressor restarted  Essential hypertension -Continue HCTZ/cozaar   Type 2 diabetes, well controlled -Hemoglobin A1c 7.4 -Sliding scale insulin  Mild cognitive impairment -Hold Aricept due to bradycardia -Continue namenda   History of breast cancer -Continue anastrozole  Hypothyroidism -Mildly elevated free T4 1.13, low TSH 0.319. Repeat labs as outpatient in 4-6 weeks   DVT prophylaxis: IV heparin. Hold Coumadin   Code Status: Full code Family Communication: None at bedside Disposition Plan:  Status is: Inpatient  Remains inpatient appropriate because:Ongoing diagnostic testing needed not appropriate for outpatient work up   Dispo: The patient is from: Home              Anticipated d/c is to: To be determined              Anticipated d/c date is: > 3 days              Patient currently is not medically stable to d/c. Continue monitoring bradycardia and A. fib RVR on telemetry.  May need further evaluation by EP.  Surgical timing per orthopedic surgery.    Consultants:   Orthopedic surgery  Cardiology  Procedures:   None  Antimicrobials:  Anti-infectives (From admission, onward)   Start     Dose/Rate Route Frequency Ordered Stop   06/10/20 1900  cefTRIAXone (ROCEPHIN) 1 g in sodium chloride 0.9 % 100 mL IVPB        1 g 200 mL/hr over 30 Minutes Intravenous Every 24  hours 06/10/20 1855         Objective: Vitals:   06/12/20 2220 06/13/20 0010 06/13/20 0442 06/13/20 0818  BP: (S) (!) 113/53 (!) 128/58 133/63 136/62  Pulse: (!) 52   (!) 59  Resp:  18 18   Temp:  98.2 F (36.8 C) 98.1 F (36.7 C) 97.8 F (36.6 C)  TempSrc:  Oral Oral Oral  SpO2: 93%   96%  Weight:      Height:        Intake/Output Summary (Last 24 hours) at 06/13/2020 1200 Last data filed at 06/12/2020 2014 Gross per 24 hour  Intake 193.34 ml  Output 250 ml   Net -56.66 ml   Filed Weights   06/11/20 0904 06/12/20 0430  Weight: 66.4 kg 65.8 kg   Examination: General exam: Appears calm and comfortable  Respiratory system: Clear to auscultation. Respiratory effort normal. Cardiovascular system: S1 & S2 heard, regular rate and rhythm, rate 60s. No pedal edema. Gastrointestinal system: Abdomen is nondistended, soft and nontender. Normal bowel sounds heard. Central nervous system: Alert and oriented. Non focal exam. Speech clear  Extremities: Right lower extremity in brace Psychiatry: Judgement and insight appear stable. Mood & affect appropriate.    Data Reviewed: I have personally reviewed following labs and imaging studies  CBC: Recent Labs  Lab 06/10/20 1303 06/11/20 0333 06/12/20 0257 06/13/20 0330  WBC 15.0* 13.9* 14.7* 15.3*  NEUTROABS 11.1*  --   --   --   HGB 14.6 13.5 14.6 13.7  HCT 41.9 39.7 41.4 39.8  MCV 92.3 93.0 90.2 90.9  PLT 287 207 203 409   Basic Metabolic Panel: Recent Labs  Lab 06/10/20 1303 06/11/20 0333  NA 139 140  K 4.6 3.6  CL 101 103  CO2 23 23  GLUCOSE 160* 173*  BUN 17 18  CREATININE 0.84 0.94  CALCIUM 9.4 9.2   GFR: Estimated Creatinine Clearance: 39.9 mL/min (by C-G formula based on SCr of 0.94 mg/dL). Liver Function Tests: No results for input(s): AST, ALT, ALKPHOS, BILITOT, PROT, ALBUMIN in the last 168 hours. No results for input(s): LIPASE, AMYLASE in the last 168 hours. No results for input(s): AMMONIA in the last 168 hours. Coagulation Profile: Recent Labs  Lab 06/10/20 1943 06/11/20 0333 06/12/20 0257 06/13/20 0330  INR 2.4* 2.2* 1.6* 1.3*   Cardiac Enzymes: No results for input(s): CKTOTAL, CKMB, CKMBINDEX, TROPONINI in the last 168 hours. BNP (last 3 results) No results for input(s): PROBNP in the last 8760 hours. HbA1C: Recent Labs    06/11/20 0333  HGBA1C 7.4*   CBG: Recent Labs  Lab 06/12/20 0917 06/12/20 1121 06/12/20 1617 06/12/20 2130 06/13/20 0816   GLUCAP 213* 250* 214* 222* 152*   Lipid Profile: No results for input(s): CHOL, HDL, LDLCALC, TRIG, CHOLHDL, LDLDIRECT in the last 72 hours. Thyroid Function Tests: Recent Labs    06/11/20 0333  TSH 0.319*  FREET4 1.13*   Anemia Panel: No results for input(s): VITAMINB12, FOLATE, FERRITIN, TIBC, IRON, RETICCTPCT in the last 72 hours. Sepsis Labs: No results for input(s): PROCALCITON, LATICACIDVEN in the last 168 hours.  Recent Results (from the past 240 hour(s))  Resp Panel by RT-PCR (Flu A&B, Covid) Nasopharyngeal Swab     Status: None   Collection Time: 06/10/20  3:18 PM   Specimen: Nasopharyngeal Swab; Nasopharyngeal(NP) swabs in vial transport medium  Result Value Ref Range Status   SARS Coronavirus 2 by RT PCR NEGATIVE NEGATIVE Final    Comment: (NOTE) SARS-CoV-2 target  nucleic acids are NOT DETECTED.  The SARS-CoV-2 RNA is generally detectable in upper respiratory specimens during the acute phase of infection. The lowest concentration of SARS-CoV-2 viral copies this assay can detect is 138 copies/mL. A negative result does not preclude SARS-Cov-2 infection and should not be used as the sole basis for treatment or other patient management decisions. A negative result may occur with  improper specimen collection/handling, submission of specimen other than nasopharyngeal swab, presence of viral mutation(s) within the areas targeted by this assay, and inadequate number of viral copies(<138 copies/mL). A negative result must be combined with clinical observations, patient history, and epidemiological information. The expected result is Negative.  Fact Sheet for Patients:  EntrepreneurPulse.com.au  Fact Sheet for Healthcare Providers:  IncredibleEmployment.be  This test is no t yet approved or cleared by the [place] FDA and  has been authorized for detection and/or diagnosis of SARS-CoV-2 by FDA under an Emergency Use  Authorization (EUA). This EUA will remain  in effect (meaning this test can be used) for the duration of the COVID-19 declaration under Section 564(b)(1) of the Act, 21 U.S.C.section 360bbb-3(b)(1), unless the authorization is terminated  or revoked sooner.       Influenza A by PCR NEGATIVE NEGATIVE Final   Influenza B by PCR NEGATIVE NEGATIVE Final    Comment: (NOTE) The Xpert Xpress SARS-CoV-2/FLU/RSV plus assay is intended as an aid in the diagnosis of influenza from Nasopharyngeal swab specimens and should not be used as a sole basis for treatment. Nasal washings and aspirates are unacceptable for Xpert Xpress SARS-CoV-2/FLU/RSV testing.  Fact Sheet for Patients: EntrepreneurPulse.com.au  Fact Sheet for Healthcare Providers: IncredibleEmployment.be  This test is not yet approved or cleared by the [place] FDA and has been authorized for detection and/or diagnosis of SARS-CoV-2 by FDA under an Emergency Use Authorization (EUA). This EUA will remain in effect (meaning this test can be used) for the duration of the COVID-19 declaration under Section 564(b)(1) of the Act, 21 U.S.C. section 360bbb-3(b)(1), unless the authorization is terminated or revoked.  Performed at [facility], [place] [address] [place], [place] [address]   Culture, Urine     Status: Abnormal   Collection Time: 06/10/20  3:18 PM   Specimen: Urine, Random  Result Value Ref Range Status   Specimen Description URINE, RANDOM  Final   Special Requests   Final    NONE Performed at [facility], [place] [address] [place], [place] [address]    Culture >=100,000 COLONIES/mL ESCHERICHIA COLI (A)  Final   Report Status 06/13/2020 FINAL  Final   Organism ID, Bacteria ESCHERICHIA COLI (A)  Final      Susceptibility   Escherichia coli - MIC*    AMPICILLIN <=2 SENSITIVE Sensitive     CEFAZOLIN <=4 SENSITIVE Sensitive     CEFEPIME <=0.12 SENSITIVE Sensitive      CEFTRIAXONE <=0.25 SENSITIVE Sensitive     CIPROFLOXACIN <=0.25 SENSITIVE Sensitive     GENTAMICIN <=1 SENSITIVE Sensitive     IMIPENEM <=0.25 SENSITIVE Sensitive     NITROFURANTOIN <=16 SENSITIVE Sensitive     TRIMETH/SULFA <=20 SENSITIVE Sensitive     AMPICILLIN/SULBACTAM <=2 SENSITIVE Sensitive     PIP/TAZO <=4 SENSITIVE Sensitive     * >=100,000 COLONIES/mL ESCHERICHIA COLI  Surgical PCR screen     Status: None   Collection Time: 06/11/20  3:11 PM   Specimen: Nasal Mucosa; Nasal Swab  Result Value Ref Range Status   MRSA, PCR  NEGATIVE NEGATIVE Final   Staphylococcus aureus NEGATIVE NEGATIVE Final    Comment: (NOTE) The Xpert SA Assay (FDA approved for NASAL specimens in patients 42 years of age and older), is one component of a comprehensive surveillance program. It is not intended to diagnose infection nor to guide or monitor treatment. Performed at [facility], [place] [address], [place], [place] [address]       Radiology Studies: No results found.    Scheduled Meds: . anastrozole  1 mg Oral Daily  . docusate sodium  100 mg Oral BID  . feeding supplement (GLUCERNA SHAKE)  237 mL Oral TID BM  . hydrochlorothiazide  25 mg Oral Daily  . insulin aspart  0-9 Units Subcutaneous TID WC  . losartan  100 mg Oral Daily  . memantine  10 mg Oral BID  . metoprolol tartrate  25 mg Oral BID  . multivitamin with minerals  1 tablet Oral Daily   Continuous Infusions: . sodium chloride 250 mL (06/11/20 1701)  . cefTRIAXone (ROCEPHIN)  IV 1 g (06/12/20 1835)  . heparin 1,000 Units/hr (06/13/20 1119)     LOS: 3 days      Time spent: 25 minutes   Dessa Phi, DO [facility] 06/13/2020, 12:00 PM   Available via Epic secure chat 7am-7pm After these hours, please refer to coverage provider listed on [email or web address]

## 2020-06-13 NOTE — Progress Notes (Signed)
Progress Note  Patient Name: Cynthia Mathis Date of Encounter: 06/13/2020  Primary Cardiologist: Ena Dawley, MD   Subjective   Feels the same. Seen with son Richardson Landry present.   Inpatient Medications    Scheduled Meds: . anastrozole  1 mg Oral Daily  . docusate sodium  100 mg Oral BID  . feeding supplement (GLUCERNA SHAKE)  237 mL Oral TID BM  . hydrochlorothiazide  25 mg Oral Daily  . insulin aspart  0-9 Units Subcutaneous TID WC  . losartan  100 mg Oral Daily  . memantine  10 mg Oral BID  . metoprolol tartrate  25 mg Oral BID  . multivitamin with minerals  1 tablet Oral Daily   Continuous Infusions: . sodium chloride 250 mL (06/11/20 1701)  . cefTRIAXone (ROCEPHIN)  IV 1 g (06/12/20 1835)  . heparin 1,000 Units/hr (06/13/20 0131)   PRN Meds: sodium chloride, HYDROcodone-acetaminophen, metoprolol tartrate, morphine injection   Vital Signs    Vitals:   06/12/20 2220 06/13/20 0010 06/13/20 0442 06/13/20 0818  BP: (S) (!) 113/53 (!) 128/58 133/63 136/62  Pulse: (!) 52   (!) 59  Resp:  18 18   Temp:  98.2 F (36.8 C) 98.1 F (36.7 C) 97.8 F (36.6 C)  TempSrc:  Oral Oral Oral  SpO2: 93%   96%  Weight:      Height:        Intake/Output Summary (Last 24 hours) at 06/13/2020 1046 Last data filed at 06/12/2020 2014 Gross per 24 hour  Intake 673.34 ml  Output 250 ml  Net 423.34 ml   Filed Weights   06/11/20 0904 06/12/20 0430  Weight: 66.4 kg 65.8 kg    Telemetry    Sinus rhythm, rate 60 - Personally Reviewed  ECG    No new - Personally Reviewed  Physical Exam   GEN: No acute distress.   Neck: No JVD Cardiac: regular rhythm, normal rate, no murmurs, rubs, or gallops.  Respiratory: Clear to auscultation bilaterally. GI: Soft, nontender, non-distended  MS: No edema; Neuro:  Nonfocal  Psych: Normal affect   Labs    Chemistry Recent Labs  Lab 06/10/20 1303 06/11/20 0333  NA 139 140  K 4.6 3.6  CL 101 103  CO2 23 23  GLUCOSE 160* 173*   BUN 17 18  CREATININE 0.84 0.94  CALCIUM 9.4 9.2  GFRNONAA >60 59*  ANIONGAP 15 14     Hematology Recent Labs  Lab 06/11/20 0333 06/12/20 0257 06/13/20 0330  WBC 13.9* 14.7* 15.3*  RBC 4.27 4.59 4.38  HGB 13.5 14.6 13.7  HCT 39.7 41.4 39.8  MCV 93.0 90.2 90.9  MCH 31.6 31.8 31.3  MCHC 34.0 35.3 34.4  RDW 12.7 12.5 12.4  PLT 207 203 223    Cardiac EnzymesNo results for input(s): TROPONINI in the last 168 hours. No results for input(s): TROPIPOC in the last 168 hours.   BNPNo results for input(s): BNP, PROBNP in the last 168 hours.   DDimer No results for input(s): DDIMER in the last 168 hours.   Radiology    No results found.  Cardiac Studies   IMPRESSIONS    1. Left ventricular ejection fraction, by estimation, is 65 to 70%. The  left ventricle has normal function. The left ventricle has no regional  wall motion abnormalities. Left ventricular diastolic parameters are  indeterminate.  2. Right ventricular systolic function is normal. The right ventricular  size is normal. There is normal pulmonary artery systolic pressure.  3. The mitral valve is normal in structure. No evidence of mitral valve  regurgitation. No evidence of mitral stenosis.  4. The aortic valve is normal in structure. Aortic valve regurgitation is  trivial. No aortic stenosis is present.   Patient Profile     86 y.o. female with diabetes, hyperlipidemia, hypertension, PAF, breast cancer who presents to the hospital with an episode of syncope leading to a right ankle fracture.   Assessment & Plan   Principal Problem:   Closed displaced trimalleolar fracture of right ankle Active Problems:   Osteopenia   Paroxysmal atrial fibrillation (HCC)   Mild cognitive impairment   Leukocytosis   Bradycardia   Syncope   PAF - back in SR this morning with rates between 40-60. This is where we started on day of presentation. This is a challenging situation and she may need discussion with  cardiology and EP as an outpatient to determine best management to avoid falls at home. For now, she is stable hemodynamically. Continue metoprolol tartrate 25 mg BID today. Continue heparin IV while anticipating surgery.  Syncope Bradycardia Preoperative cardiovascular risk The patient is intermediate-high risk for intermediate risk procedure.  No further cardiovascular testing is required prior to the procedure.  If this level of risk is acceptable to the patient and surgical team, the patient should be considered optimized from a cardiovascular standpoint. This risk level is currently nonmodifiable. Patient and son understand the risk categorization and are willing to proceed.       For questions or updates, please contact [place] Please consult [email or web address] for contact info under        Signed, Elouise Munroe, MD  06/13/2020, 10:46 AM

## 2020-06-13 NOTE — Progress Notes (Signed)
[place] for Heparin when INR is <2 Indication: atrial fibrillation  Allergies  Allergen Reactions  . Epinephrine Palpitations    heart racing    Patient Measurements: Height: 5\' 3"  (160 cm) Weight: 65.8 kg (145 lb) IBW/kg (Calculated) : 52.4 Heparin Dosing Weight: 66.4kg  Vital Signs: Temp: 98.2 F (36.8 C) (12/05 0010) Temp Source: Oral (12/05 0010) BP: 128/58 (12/05 0010) Pulse Rate: 52 (12/04 2220)  Labs: Recent Labs    06/10/20 1303 06/10/20 1303 06/10/20 1943 06/11/20 0333 06/12/20 0257 06/12/20 1416 06/12/20 2259  HGB 14.6   < >  --  13.5 14.6  --   --   HCT 41.9  --   --  39.7 41.4  --   --   PLT 287  --   --  207 203  --   --   LABPROT  --   --  25.0* 23.3* 18.4*  --   --   INR  --   --  2.4* 2.2* 1.6*  --   --   HEPARINUNFRC  --   --   --   --   --  <0.10* <0.10*  CREATININE 0.84  --   --  0.94  --   --   --    < > = values in this interval not displayed.    Estimated Creatinine Clearance: 39.9 mL/min (by C-G formula based on SCr of 0.94 mg/dL).   Medical History: Past Medical History:  Diagnosis Date  . Anxiety   . Borderline diabetes mellitus   . Cancer ([place]) 2020   breast  . Diverticulosis of colon   . DJD (degenerative joint disease)   . Dyslipidemia   . History of hematuria   . History of osteoporosis   . Hx of colonic polyps   . Hypertension   . Hypothyroid   . Macular degeneration    sees [name] for general eye care, sees [name] for retinal injections   . Memory loss   . Osteoporosis    took bisphosphonates, now off. Last DEXA in 2015 showed osteopenia   . Paroxysmal atrial fibrillation (HCC)    sees [name]   . SCCA (squamous cell carcinoma) of skin 12/11/2019   Left Hand Posterior (in situ)    Medications:  Scheduled:  . anastrozole  1 mg Oral Daily  . docusate sodium  100 mg Oral BID  . feeding supplement (GLUCERNA SHAKE)  237 mL Oral TID BM  .  hydrochlorothiazide  25 mg Oral Daily  . insulin aspart  0-9 Units Subcutaneous TID WC  . losartan  100 mg Oral Daily  . memantine  10 mg Oral BID  . metoprolol tartrate  25 mg Oral BID  . multivitamin with minerals  1 tablet Oral Daily    Assessment: Patient is a 60 yof that is being admitted after passing out. Patient was found to have ankle fracture with plans for ortho taking the patient to the OR early next week for an ORIF. Patient takes warfarin at home for afib and pharmacy has been asked to bridge the patient with heparin while holding warfarin.  -INR= 2.2 -plans for vitamin K 5mg  po today per ortho  12/5 AM update:  Heparin level remains undetectable No issues per RN  Goal of Therapy:  Heparin level 0.3-0.7 units/ml Monitor platelets by anticoagulation protocol: Yes   Plan:  -Inc heparin to 1000 units/hr -0900 heparin level -Monitor for bleeding  Jeneen Rinks  Jerrilyn Cairo, PharmD, [place] Clinical Pharmacist Phone: [phone]

## 2020-06-13 NOTE — Progress Notes (Addendum)
   06/13/20 1003  OTHER  Substance Abuse Education Offered No  Substance abuse interventions Other (must comment) (Patient denied any history of concerns with alcohol or illicit substances. Patient reports she may have a glass of wine with dinner once or twice a week but only one glass. Patient declined additional information at this time. \)  (CAGE-AID) Substance Abuse Screening Tool  Have You Ever Felt You Ought to Cut Down on Your Drinking or Drug Use? 0  Have People Annoyed You By Critizing Your Drinking Or Drug Use? 0  Have You Felt Bad Or Guilty About Your Drinking Or Drug Use? 0  Have You Ever Had a Drink or Used Drugs First Thing In The Morning to Steady Your Nerves or to Get Rid of a Hangover? 0  CAGE-AID Score 0   CSW noted patient and son inquired about follow-up plans post decision to do surgery or not. CSW reviewed acute rehab, SNF, and HH with family and notes that they are open to recommendations but would prefer if possible to have patient at son's home with the holidays coming uo.

## 2020-06-14 ENCOUNTER — Inpatient Hospital Stay (HOSPITAL_COMMUNITY): Payer: Medicare Other | Admitting: Certified Registered"

## 2020-06-14 ENCOUNTER — Encounter (HOSPITAL_COMMUNITY)
Admission: EM | Disposition: A | Discharge status: Skilled Nursing Facility | Payer: Self-pay | Source: Home / Self Care | Attending: Internal Medicine

## 2020-06-14 ENCOUNTER — Inpatient Hospital Stay (HOSPITAL_COMMUNITY): Payer: Medicare Other

## 2020-06-14 ENCOUNTER — Encounter (HOSPITAL_COMMUNITY): Payer: Self-pay | Admitting: Internal Medicine

## 2020-06-14 DIAGNOSIS — S82851A Displaced trimalleolar fracture of right lower leg, initial encounter for closed fracture: Secondary | ICD-10-CM | POA: Diagnosis not present

## 2020-06-14 DIAGNOSIS — R001 Bradycardia, unspecified: Secondary | ICD-10-CM | POA: Diagnosis not present

## 2020-06-14 HISTORY — PX: ORIF ANKLE FRACTURE: SHX5408

## 2020-06-14 LAB — GLUCOSE, CAPILLARY
Glucose-Capillary: 177 mg/dL — ABNORMAL HIGH (ref 70–99)
Glucose-Capillary: 124 mg/dL — ABNORMAL HIGH (ref 70–99)
Glucose-Capillary: 108 mg/dL — ABNORMAL HIGH (ref 70–99)
Glucose-Capillary: 183 mg/dL — ABNORMAL HIGH (ref 70–99)

## 2020-06-14 LAB — CBC
HCT: 38.2 % (ref 36.0–46.0)
Hemoglobin: 13.2 g/dL (ref 12.0–15.0)
MCH: 31.4 pg (ref 26.0–34.0)
MCHC: 34.6 g/dL (ref 30.0–36.0)
MCV: 91 fL (ref 80.0–100.0)
Platelets: 222 10*3/uL (ref 150–400)
RBC: 4.2 MIL/uL (ref 3.87–5.11)
RDW: 12.4 % (ref 11.5–15.5)
WBC: 13.9 10*3/uL — ABNORMAL HIGH (ref 4.0–10.5)
nRBC: 0 % (ref 0.0–0.2)

## 2020-06-14 LAB — HEPARIN LEVEL (UNFRACTIONATED): Heparin Unfractionated: 0.82 IU/mL — ABNORMAL HIGH (ref 0.30–0.70)

## 2020-06-14 LAB — PROTIME-INR
INR: 1.3 — ABNORMAL HIGH (ref 0.8–1.2)
Prothrombin Time: 15.3 seconds — ABNORMAL HIGH (ref 11.4–15.2)

## 2020-06-14 SURGERY — OPEN REDUCTION INTERNAL FIXATION (ORIF) ANKLE FRACTURE
Anesthesia: General | Site: Ankle | Laterality: Right

## 2020-06-14 MED ORDER — BUPIVACAINE-EPINEPHRINE (PF) 0.5% -1:200000 IJ SOLN
INTRAMUSCULAR | Status: DC | PRN
  Administered 2020-06-14 (×2): 20 mL via PERINEURAL

## 2020-06-14 MED ORDER — GLYCOPYRROLATE PF 0.2 MG/ML IJ SOSY
PREFILLED_SYRINGE | INTRAMUSCULAR | Status: DC | PRN
  Administered 2020-06-14: .2 mg via INTRAVENOUS

## 2020-06-14 MED ORDER — FENTANYL CITRATE (PF) 100 MCG/2ML IJ SOLN
INTRAMUSCULAR | Status: AC
  Administered 2020-06-14: 50 ug via INTRAVENOUS
  Filled 2020-06-14: qty 2

## 2020-06-14 MED ORDER — MIDAZOLAM HCL 2 MG/2ML IJ SOLN
INTRAMUSCULAR | Status: AC
  Filled 2020-06-14: qty 2

## 2020-06-14 MED ORDER — DEXAMETHASONE SODIUM PHOSPHATE 10 MG/ML IJ SOLN
INTRAMUSCULAR | Status: DC | PRN
  Administered 2020-06-14: 4 mg via INTRAVENOUS

## 2020-06-14 MED ORDER — FENTANYL CITRATE (PF) 100 MCG/2ML IJ SOLN: 50.0000 ug | Freq: Once | INTRAMUSCULAR | Status: AC

## 2020-06-14 MED ORDER — PHENYLEPHRINE HCL-NACL 10-0.9 MG/250ML-% IV SOLN
INTRAVENOUS | Status: DC | PRN
  Administered 2020-06-14: 30 ug/min via INTRAVENOUS

## 2020-06-14 MED ORDER — FENTANYL CITRATE (PF) 100 MCG/2ML IJ SOLN: 25.0000 ug | INTRAMUSCULAR | Status: DC | PRN

## 2020-06-14 MED ORDER — OXYCODONE HCL 5 MG/5ML PO SOLN: 5.0000 mg | Freq: Once | ORAL | Status: DC | PRN

## 2020-06-14 MED ORDER — PROPOFOL 10 MG/ML IV BOLUS
INTRAVENOUS | Status: DC | PRN
  Administered 2020-06-14: 80 mg via INTRAVENOUS

## 2020-06-14 MED ORDER — LIDOCAINE 2% (20 MG/ML) 5 ML SYRINGE
INTRAMUSCULAR | Status: DC | PRN
  Administered 2020-06-14: 30 mg via INTRAVENOUS

## 2020-06-14 MED ORDER — ONDANSETRON HCL 4 MG/2ML IJ SOLN
INTRAMUSCULAR | Status: DC | PRN
  Administered 2020-06-14: 4 mg via INTRAVENOUS

## 2020-06-14 MED ORDER — METOPROLOL TARTRATE 12.5 MG HALF TABLET
12.5000 mg | ORAL_TABLET | Freq: Two times a day (BID) | ORAL | Status: DC
  Administered 2020-06-14 – 2020-06-19 (×10): 12.5 mg via ORAL
  Filled 2020-06-14 (×11): qty 1

## 2020-06-14 MED ORDER — CHLORHEXIDINE GLUCONATE 0.12 % MT SOLN
15.0000 mL | Freq: Once | OROMUCOSAL | Status: AC
  Administered 2020-06-14: 15 mL via OROMUCOSAL
  Filled 2020-06-14: qty 15

## 2020-06-14 MED ORDER — EPHEDRINE SULFATE 50 MG/ML IJ SOLN
INTRAMUSCULAR | Status: DC | PRN
  Administered 2020-06-14: 10 mg via INTRAVENOUS

## 2020-06-14 MED ORDER — FENTANYL CITRATE (PF) 100 MCG/2ML IJ SOLN
INTRAMUSCULAR | Status: AC
  Filled 2020-06-14: qty 2

## 2020-06-14 MED ORDER — OXYCODONE HCL 5 MG PO TABS: 5.0000 mg | ORAL_TABLET | Freq: Once | ORAL | Status: DC | PRN

## 2020-06-14 MED ORDER — CEFAZOLIN SODIUM-DEXTROSE 2-4 GM/100ML-% IV SOLN
2.0000 g | Freq: Three times a day (TID) | INTRAVENOUS | Status: AC
  Administered 2020-06-15 (×2): 2 g via INTRAVENOUS
  Filled 2020-06-14 (×4): qty 100

## 2020-06-14 MED ORDER — LACTATED RINGERS IV SOLN: INTRAVENOUS | Status: DC

## 2020-06-14 MED ORDER — FENTANYL CITRATE (PF) 250 MCG/5ML IJ SOLN
INTRAMUSCULAR | Status: DC | PRN
  Administered 2020-06-14: 25 ug via INTRAVENOUS

## 2020-06-14 MED ORDER — ORAL CARE MOUTH RINSE: 15.0000 mL | Freq: Once | OROMUCOSAL | Status: AC

## 2020-06-14 SURGICAL SUPPLY — 72 items
BANDAGE ESMARK 6X9 LF (GAUZE/BANDAGES/DRESSINGS) IMPLANT
BIT DRILL 2.7 QC CANN 155 (BIT) ×1 IMPLANT
BIT DRILL QC 2.0 SHORT EVOS SM (DRILL) IMPLANT
BIT DRILL QC 2.5MM SHRT EVO SM (DRILL) IMPLANT
BLADE SURG 15 STRL LF DISP TIS (BLADE) ×1 IMPLANT
BLADE SURG 15 STRL SS (BLADE) ×2
BNDG CMPR 9X6 STRL LF SNTH (GAUZE/BANDAGES/DRESSINGS)
BNDG COHESIVE 4X5 TAN STRL (GAUZE/BANDAGES/DRESSINGS) ×2 IMPLANT
BNDG ELASTIC 4X5.8 VLCR STR LF (GAUZE/BANDAGES/DRESSINGS) ×1 IMPLANT
BNDG ELASTIC 6X5.8 VLCR STR LF (GAUZE/BANDAGES/DRESSINGS) ×1 IMPLANT
BNDG ESMARK 6X9 LF (GAUZE/BANDAGES/DRESSINGS)
CANISTER SUCT 3000ML PPV (MISCELLANEOUS) ×2 IMPLANT
COVER SURGICAL LIGHT HANDLE (MISCELLANEOUS) ×2 IMPLANT
COVER WAND RF STERILE (DRAPES) ×2 IMPLANT
CUFF TOURN SGL QUICK 34 (TOURNIQUET CUFF) ×2
CUFF TOURN SGL QUICK 42 (TOURNIQUET CUFF) IMPLANT
CUFF TRNQT CYL 34X4.125X (TOURNIQUET CUFF) IMPLANT
DRAPE C-ARM 42X72 X-RAY (DRAPES) ×2 IMPLANT
DRAPE C-ARMOR (DRAPES) ×2 IMPLANT
DRAPE INCISE IOBAN 66X45 STRL (DRAPES) IMPLANT
DRAPE U-SHAPE 47X51 STRL (DRAPES) ×2 IMPLANT
DRILL QC 2.0 SHORT EVOS SM (DRILL) ×2
DRILL QC 2.5MM SHORT EVOS SM (DRILL) ×2
DRSG PAD ABDOMINAL 8X10 ST (GAUZE/BANDAGES/DRESSINGS) ×2 IMPLANT
DURAPREP 26ML APPLICATOR (WOUND CARE) ×2 IMPLANT
ELECT CAUTERY BLADE 6.4 (BLADE) ×2 IMPLANT
ELECT REM PT RETURN 9FT ADLT (ELECTROSURGICAL) ×2
ELECTRODE REM PT RTRN 9FT ADLT (ELECTROSURGICAL) ×1 IMPLANT
GAUZE SPONGE 4X4 12PLY STRL (GAUZE/BANDAGES/DRESSINGS) ×3 IMPLANT
GAUZE XEROFORM 5X9 LF (GAUZE/BANDAGES/DRESSINGS) ×2 IMPLANT
GLOVE BIOGEL PI IND STRL 7.0 (GLOVE) ×1 IMPLANT
GLOVE BIOGEL PI INDICATOR 7.0 (GLOVE) ×1
GLOVE ECLIPSE 7.0 STRL STRAW (GLOVE) ×2 IMPLANT
GLOVE SKINSENSE NS SZ7.5 (GLOVE) ×1
GLOVE SKINSENSE STRL SZ7.5 (GLOVE) ×1 IMPLANT
GLOVE SURG SYN 7.5  E (GLOVE) ×4
GLOVE SURG SYN 7.5 E (GLOVE) ×2 IMPLANT
GLOVE SURG SYN 7.5 PF PI (GLOVE) ×2 IMPLANT
GOWN STRL REIN XL XLG (GOWN DISPOSABLE) ×2 IMPLANT
GUIDE PIN 1.3 (PIN) ×4
K-WIRE 1.6 (WIRE) ×2
K-WIRE FX150X1.6XTROC PNT (WIRE) ×1
KIT BASIN OR (CUSTOM PROCEDURE TRAY) ×2 IMPLANT
KIT TURNOVER KIT B (KITS) ×2 IMPLANT
KWIRE FX150X1.6XTROC PNT (WIRE) IMPLANT
NDL HYPO 25GX1X1/2 BEV (NEEDLE) IMPLANT
NEEDLE HYPO 25GX1X1/2 BEV (NEEDLE) IMPLANT
NS IRRIG 1000ML POUR BTL (IV SOLUTION) ×2 IMPLANT
PACK ORTHO EXTREMITY (CUSTOM PROCEDURE TRAY) ×2 IMPLANT
PAD ARMBOARD 7.5X6 YLW CONV (MISCELLANEOUS) ×4 IMPLANT
PAD CAST 4YDX4 CTTN HI CHSV (CAST SUPPLIES) IMPLANT
PADDING CAST COTTON 4X4 STRL (CAST SUPPLIES) ×2
PADDING CAST COTTON 6X4 STRL (CAST SUPPLIES) ×3 IMPLANT
PIN GUIDE 1.3 (PIN) IMPLANT
PLATE FIB EVOS 2.7/3.5 7H R103 (Plate) ×1 IMPLANT
SCREW BONE 4.0X16MM OST (Screw) ×1 IMPLANT
SCREW CANNULATED 4.0X35 (Screw) ×2 IMPLANT
SCREW CORT 2.7X16 STAR T8 EVOS (Screw) ×1 IMPLANT
SCREW CORT 3.5X10MM ST EVOS (Screw) ×1 IMPLANT
SCREW CORT 3.5X11 ST EVOS (Screw) ×2 IMPLANT
SCREW CORTEX 2.7X15 (Screw) ×1 IMPLANT
SCREW LOCK 2.7X13 ST EVOS (Screw) ×1 IMPLANT
SUCTION FRAZIER HANDLE 10FR (MISCELLANEOUS) ×2
SUCTION TUBE FRAZIER 10FR DISP (MISCELLANEOUS) ×1 IMPLANT
SUT ETHILON 3 0 PS 1 (SUTURE) IMPLANT
SUT VIC AB 2-0 CT1 27 (SUTURE)
SUT VIC AB 2-0 CT1 TAPERPNT 27 (SUTURE) IMPLANT
SYR CONTROL 10ML LL (SYRINGE) IMPLANT
TOWEL GREEN STERILE (TOWEL DISPOSABLE) ×4 IMPLANT
TOWEL GREEN STERILE FF (TOWEL DISPOSABLE) ×2 IMPLANT
TUBE CONNECTING 12X1/4 (SUCTIONS) ×2 IMPLANT
UNDERPAD 30X36 HEAVY ABSORB (UNDERPADS AND DIAPERS) ×2 IMPLANT

## 2020-06-14 NOTE — Op Note (Signed)
   Date of Surgery: 06/14/2020  INDICATIONS: [name] is a 86 y.o.-year-old female who sustained a right ankle fracture; she was indicated for open reduction and internal fixation due to the displaced nature of the articular fracture and came to the operating room today for this procedure. The patient did consent to the procedure after discussion of the risks and benefits.  PREOPERATIVE DIAGNOSIS: right trimalleolar ankle fracture  POSTOPERATIVE DIAGNOSIS: Same.  PROCEDURE: Open treatment of right ankle fracture with internal fixation. Trimalleolar w/o fixation of posterior malleolus CPT 27822  SURGEON: N. Eduard Roux, M.D.  ASSIST: None  ANESTHESIA:  general, popliteal block  TOURNIQUET TIME: less than 1 hr  IV FLUIDS AND URINE: See anesthesia.  ESTIMATED BLOOD LOSS: minimal mL.  IMPLANTS: Tamala Julian and Nephew  COMPLICATIONS: see description of procedure.  DESCRIPTION OF PROCEDURE: The patient was brought to the operating room and placed supine on the operating table.  The patient had been signed prior to the procedure and this was documented. The patient had the anesthesia placed by the anesthesiologist.  A nonsterile tourniquet was placed on the upper thigh.  The prep verification and incision time-outs were performed to confirm that this was the correct patient, site, side and location. The patient had an SCD on the opposite lower extremity. The patient did receive antibiotics prior to the incision and was re-dosed during the procedure as needed at indicated intervals.  The patient had the lower extremity prepped and draped in the standard surgical fashion.  The extremity was exsanguinated using an esmarch bandage and the tourniquet was inflated to 300 mm Hg.  An incision was made over the distal fibula.  Full-thickness flaps were raised.  Subperiosteal elevation performed.  Fracture was exposed and visualized.  There was a large lateral butterfly fragment.  The bone was osteoporotic.   The fracture was not amenable to lag fixation.  The fracture was brought out to overall length and alignment and a precontoured plate was placed on the fibula spanning the fracture.  Bicortical nonlocking screws were place proximal to the fracture and unicortical cancellous and locking screws were placed distal to the fracture to avoid penetration into the ankle joint.  Each screw had excellent fixation.  Because the skin was tenuous on the medial side and that the medial malleolus was indirectly reduced anatomically from reduction of the fibula I decided to avoid making a formal incision over the tenuous skin.  2 percutaneous cannulated screws were placed across the fracture with excellent purchase and compression.  Stress exam of the ankle showed no widening of the medial clear space.  The posterior malleolus fracture was treated without internal fixation.  Surgical wounds were then thoroughly irrigated and closed in layered fashion.  Sterile dressings were applied.  Short leg splint placed.  Patient tolerated procedure well had no immediate complications.  POSTOPERATIVE PLAN: [name] will remain nonweightbearing on this leg for approximately 6 weeks; [name] will return for suture removal in 2 weeks.  He will be immobilized in a short leg splint and then transitioned to a CAM walker at his first follow up appointment.  [name] will receive DVT prophylaxis based on other medications, activity level, and risk ratio of bleeding to thrombosis.  Cynthia Cecil, MD [facility] 7:28 PM

## 2020-06-14 NOTE — Progress Notes (Signed)
[place] for Heparin when INR is <2 Indication: atrial fibrillation  Allergies  Allergen Reactions  . Epinephrine Palpitations    heart racing    Patient Measurements: Height: 5\' 3"  (160 cm) Weight: 65.8 kg (145 lb) IBW/kg (Calculated) : 52.4 Heparin Dosing Weight: 66.4kg  Vital Signs: Temp: 98.2 F (36.8 C) (12/05 1947) Temp Source: Oral (12/05 1947) BP: 125/67 (12/05 2243) Pulse Rate: 62 (12/05 2243)  Labs: Recent Labs    06/12/20 0257 06/12/20 1416 06/12/20 2259 06/13/20 0330 06/13/20 0925 06/14/20 0450  HGB 14.6  --   --  13.7  --   --   HCT 41.4  --   --  39.8  --   --   PLT 203  --   --  223  --   --   LABPROT 18.4*  --   --  15.3*  --   --   INR 1.6*  --   --  1.3*  --   --   HEPARINUNFRC  --    < > <0.10*  --  0.52 0.82*   < > = values in this interval not displayed.    Estimated Creatinine Clearance: 39.9 mL/min (by C-G formula based on SCr of 0.94 mg/dL).   Medical History: Past Medical History:  Diagnosis Date  . Anxiety   . Borderline diabetes mellitus   . Cancer ([place]) 2020   breast  . Diverticulosis of colon   . DJD (degenerative joint disease)   . Dyslipidemia   . History of hematuria   . History of osteoporosis   . Hx of colonic polyps   . Hypertension   . Hypothyroid   . Macular degeneration    sees [name] for general eye care, sees [name] for retinal injections   . Memory loss   . Osteoporosis    took bisphosphonates, now off. Last DEXA in 2015 showed osteopenia   . Paroxysmal atrial fibrillation (HCC)    sees [name]   . SCCA (squamous cell carcinoma) of skin 12/11/2019   Left Hand Posterior (in situ)    Medications:  Scheduled:  . anastrozole  1 mg Oral Daily  . cephALEXin  500 mg Oral Q6H  . docusate sodium  100 mg Oral BID  . feeding supplement (GLUCERNA SHAKE)  237 mL Oral TID BM  . hydrochlorothiazide  25 mg Oral Daily  . insulin aspart  0-9 Units Subcutaneous  TID WC  . losartan  100 mg Oral Daily  . memantine  10 mg Oral BID  . metoprolol tartrate  25 mg Oral BID  . multivitamin with minerals  1 tablet Oral Daily    Assessment: Patient is a 18 yof that is being admitted after passing out. Patient was found to have ankle fracture with plans for ortho taking the patient to the OR early next week for an ORIF. Patient takes warfarin at home for afib and pharmacy has been asked to bridge the patient with heparin while holding warfarin.  -INR= 2.2 -plans for vitamin K 5mg  po today per ortho  12/6 AM update:  Heparin level elevated No issues per RN  Goal of Therapy:  Heparin level 0.3-0.7 units/ml Monitor platelets by anticoagulation protocol: Yes   Plan:  -Dec heparin to 850 units/hr -Heparin off at 1130 for surgery -F/U re-start post-op  Narda Bonds, PharmD, BCPS Clinical Pharmacist Phone: [phone]

## 2020-06-14 NOTE — Anesthesia Procedure Notes (Signed)
Anesthesia Regional Block: Popliteal block   Pre-Anesthetic Checklist: ,, timeout performed, Correct Patient, Correct Site, Correct Laterality, Correct Procedure, Correct Position, site marked, Risks and benefits discussed,  Surgical consent,  Pre-op evaluation,  At surgeon's request and post-op pain management  Laterality: Right and Lower  Prep: chloraprep       Needles:  Injection technique: Single-shot  Needle Type: Echogenic Needle     Needle Length: 9cm  Needle Gauge: 21     Additional Needles:   Procedures:,,,, ultrasound used (permanent image in chart),,,,  Narrative:  Start time: 06/14/2020 4:29 PM End time: 06/14/2020 4:33 PM Injection made incrementally with aspirations every 5 mL.  Performed by: Personally  Anesthesiologist: Annye Asa, MD  Additional Notes: Pt identified in Holding room.  Monitors applied. Working IV access confirmed. Sterile prep R lateral knee.  #21ga ECHOgenic Arrow block needle to sciatic nerve at split in pop fossa with US guidance.  20cc 0.5% Bupivacaine with 1:200k epi injected incrementally after negative test dose.  Patient asymptomatic, VSS, no heme aspirated, tolerated well.  Jenita Seashore, MD

## 2020-06-14 NOTE — H&P (Signed)
.  H&P update  The surgical history has been reviewed and remains accurate without interval change.  The patient was re-examined and patient's physiologic condition has not changed significantly in the last 30 days. The condition still exists that makes this procedure necessary. The treatment plan remains the same, without new options for care.  No new pharmacological allergies or types of therapy has been initiated that would change the plan or the appropriateness of the plan.  The patient and/or family understand the potential benefits and risks.  06/14/2020 10:22 AM

## 2020-06-14 NOTE — Anesthesia Preprocedure Evaluation (Addendum)
Anesthesia Evaluation  Patient identified by MRN, date of birth, ID band Patient awake    Reviewed: Allergy & Precautions, NPO status , Patient's Chart, lab work & pertinent test results, reviewed documented beta blocker date and time   History of Anesthesia Complications Negative for: history of anesthetic complications  Airway Mallampati: II  TM Distance: >3 FB Neck ROM: Full    Dental  (+) Dental Advisory Given, Missing, Poor Dentition   Pulmonary former smoker,  06/10/2020 SARS coronavirus NEG   breath sounds clear to auscultation       Cardiovascular hypertension, Pt. on medications and Pt. on home beta blockers (-) angina+ dysrhythmias Atrial Fibrillation  Rhythm:Regular Rate:Normal  06/11/2020 ECHO: EF 65-70%, no significant valvular abnormalities   Neuro/Psych Anxiety    GI/Hepatic negative GI ROS, Neg liver ROS,   Endo/Other  Hypothyroidism   Renal/GU negative Renal ROS     Musculoskeletal  (+) Arthritis ,   Abdominal (+) - obese,   Peds  Hematology Coumadin: INR 1.3   Anesthesia Other Findings H/o breast cancer  Reproductive/Obstetrics                            Anesthesia Physical Anesthesia Plan  ASA: III  Anesthesia Plan: General   Post-op Pain Management: GA combined w/ Regional for post-op pain   Induction: Intravenous  PONV Risk Score and Plan: 3 and Ondansetron, Dexamethasone and Treatment may vary due to age or medical condition  Airway Management Planned: LMA  Additional Equipment: None  Intra-op Plan:   Post-operative Plan:   Informed Consent: I have reviewed the patients History and Physical, chart, labs and discussed the procedure including the risks, benefits and alternatives for the proposed anesthesia with the patient or authorized representative who has indicated his/her understanding and acceptance.     Dental advisory given  Plan Discussed with:  CRNA and Surgeon  Anesthesia Plan Comments: (Plan routine monitors, GA with popliteal and adductor canal blocks for post op analgesia)       Anesthesia Quick Evaluation

## 2020-06-14 NOTE — Progress Notes (Signed)
Progress Note  Patient Name: Cynthia Mathis Date of Encounter: 06/14/2020  [facility] Cardiologist: Ena Dawley, MD   Subjective   Pt scheduled for surgery this afternoon. Has maintained sinus rhythm-sinus bradycardia since about 1515 on 06/12/20.  Inpatient Medications    Scheduled Meds: . anastrozole  1 mg Oral Daily  . cephALEXin  500 mg Oral Q6H  . docusate sodium  100 mg Oral BID  . feeding supplement (GLUCERNA SHAKE)  237 mL Oral TID BM  . hydrochlorothiazide  25 mg Oral Daily  . insulin aspart  0-9 Units Subcutaneous TID WC  . losartan  100 mg Oral Daily  . memantine  10 mg Oral BID  . metoprolol tartrate  25 mg Oral BID  . multivitamin with minerals  1 tablet Oral Daily   Continuous Infusions: . sodium chloride 250 mL (06/11/20 1701)  .  ceFAZolin (ANCEF) IV    . heparin 850 Units/hr (06/14/20 0552)   PRN Meds: sodium chloride, HYDROcodone-acetaminophen, metoprolol tartrate, morphine injection   Vital Signs    Vitals:   06/13/20 2215 06/13/20 2243 06/14/20 0546 06/14/20 0728  BP:  125/67  (!) 160/55  Pulse: (!) 56 62  67  Resp:  18 18 18   Temp:   98.3 F (36.8 C) 98.3 F (36.8 C)  TempSrc:   Oral Oral  SpO2:  94%  96%  Weight:      Height:        Intake/Output Summary (Last 24 hours) at 06/14/2020 0932 Last data filed at 06/14/2020 1700 Gross per 24 hour  Intake 0 ml  Output --  Net 0 ml   Last 3 Weights 06/12/2020 06/11/2020 05/10/2020  Weight (lbs) 145 lb 146 lb 6.2 oz 146 lb 4.8 oz  Weight (kg) 65.772 kg 66.4 kg 66.361 kg      Telemetry    Sinus rhythm in the 60s - Personally Reviewed  ECG    No new tracings - Personally Reviewed  Physical Exam   GEN: No acute distress.   Neck: No JVD Cardiac: RRR, no murmurs, rubs, or gallops.  Respiratory: Clear to auscultation bilaterally. GI: Soft, nontender, non-distended  MS: No edema; right ankle/leg wrapped Neuro:  Nonfocal  Psych: Normal affect   Labs    High Sensitivity  Troponin:  No results for input(s): TROPONINIHS in the last 720 hours.    Chemistry Recent Labs  Lab 06/10/20 1303 06/11/20 0333  NA 139 140  K 4.6 3.6  CL 101 103  CO2 23 23  GLUCOSE 160* 173*  BUN 17 18  CREATININE 0.84 0.94  CALCIUM 9.4 9.2  GFRNONAA >60 59*  ANIONGAP 15 14     Hematology Recent Labs  Lab 06/12/20 0257 06/13/20 0330 06/14/20 0450  WBC 14.7* 15.3* 13.9*  RBC 4.59 4.38 4.20  HGB 14.6 13.7 13.2  HCT 41.4 39.8 38.2  MCV 90.2 90.9 91.0  MCH 31.8 31.3 31.4  MCHC 35.3 34.4 34.6  RDW 12.5 12.4 12.4  PLT 203 223 222    BNPNo results for input(s): BNP, PROBNP in the last 168 hours.   DDimer No results for input(s): DDIMER in the last 168 hours.   Radiology    No results found.  Cardiac Studies   Echo 06/11/20: 1. Left ventricular ejection fraction, by estimation, is 65 to 70%. The  left ventricle has normal function. The left ventricle has no regional  wall motion abnormalities. Left ventricular diastolic parameters are  indeterminate.  2. Right ventricular systolic function  is normal. The right ventricular  size is normal. There is normal pulmonary artery systolic pressure.  3. The mitral valve is normal in structure. No evidence of mitral valve  regurgitation. No evidence of mitral stenosis.  4. The aortic valve is normal in structure. Aortic valve regurgitation is  trivial. No aortic stenosis is present.   Patient Profile     86 y.o. female with diabetes, hyperlipidemia, hypertension, PAF, breast cancer who presents to the hospital with an episode of syncope leading to a right ankle fracture.   Assessment & Plan    Ankle fracture - scheduled for OR this afternoon - heparin drip per surgery protocol   Paroxysmal atrial fibrillation Sinus bradycardia Syncope  - maintaining sinus rhythm since about 1515 06/12/20 - anticoagulated with heparin drip - she falls at home - will need to discuss with patient and family risks/benefits for  anticoagulation at discharge - continue 25 mg lopressor BID - hyperdynamic function on echo    Hypertension - SBP appears labile - on 25 mg HCTZ, 100 mg losartan, 25 mg lopressor BID - continue to follow      For questions or updates, please contact [place] Please consult [email or web address] for contact info under        Signed, Ledora Bottcher, PA  06/14/2020, 9:32 AM

## 2020-06-14 NOTE — Plan of Care (Signed)

## 2020-06-14 NOTE — Progress Notes (Signed)
PROGRESS NOTE    Cynthia Mathis  KDT:[id number] DOB: 11-Jul-1932 DOA: 06/10/2020 PCP: [name], MD     Brief Narrative:  Cynthia Mathis is an 86 y.o. female with medical history significant of hypertension, paroxysmal atrial fibrillation on Coumadin, diet-controlled diabetes mellitus type 2, and osteoporosis who presents after reportedly passing out at home.  Patient reports that she had gotten up early that morning and was talking with her houseguest in the kitchen.  She went and use the restroom and had a bowel movement.  The next thing she recalls was waking up on the floor with pain on her right ankle.  She is unsure of how long she was out and does not know if she hit her head.  Denies fever, chills, lightheadedness, palpitations, chest pain, shortness of breath, leg swelling, diarrhea, nausea, vomiting, dysuria, urinary frequency new medications, or any changes to her medications.  At baseline she lives alone, is able to walk without assistance, and complete all of her ADLs.  She does have to have company over at the time and they called 911.  Patient notes that normally her heart rates are higher than the 40s. She was found to have right bimalleolar fracture of the ankle. Due to bradycardia and syncopal episode, cardiology was consulted.  New events last 24 hours / Subjective: Remains in normal sinus rhythm.  She has no other complaints aside from soreness and pain in her right foot  Assessment & Plan:   Principal Problem:   Closed displaced trimalleolar fracture of right ankle Active Problems:   Osteopenia   Paroxysmal atrial fibrillation (HCC)   Mild cognitive impairment   Leukocytosis   Bradycardia   Syncope   Right ankle fracture -Per orthopedic surgery -Planning for OR this afternoon  Syncope and bradycardia -Initially held metoprolol and Cardizem -Continue telemetry -Appreciate cardiology following for surgical risk, please see Dr. Delphina Cahill note dated  12/5  E. coli UTI , present on admission -De-escalate to Keflex  Paroxysmal atrial fibrillation -On Coumadin at home. Continue IV heparin prior to surgery -Had A. fib RVR and Lopressor restarted -Normal sinus rhythm this morning  Essential hypertension -Continue HCTZ/cozaar   Type 2 diabetes, well controlled -Hemoglobin A1c 7.4 -Sliding scale insulin  Mild cognitive impairment -Hold Aricept due to bradycardia -Continue namenda   History of breast cancer -Continue anastrozole  Hypothyroidism -Mildly elevated free T4 1.13, low TSH 0.319. Repeat labs as outpatient in 4-6 weeks   DVT prophylaxis: IV heparin. Hold Coumadin   Code Status: Full code Family Communication: Son at bedside Disposition Plan:  Status is: Inpatient  Remains inpatient appropriate because:Inpatient level of care appropriate due to severity of illness   Dispo: The patient is from: Home              Anticipated d/c is to: To be determined              Anticipated d/c date is: 2 days              Patient currently is not medically stable to d/c.  OR today    Consultants:   Orthopedic surgery  Cardiology  Procedures:   None  Antimicrobials:  Anti-infectives (From admission, onward)   Start     Dose/Rate Route Frequency Ordered Stop   06/13/20 1945  ceFAZolin (ANCEF) IVPB 2g/100 mL premix       Note to Pharmacy: Anesthesia to give preop   2 g 200 mL/hr over 30 Minutes Intravenous  Once 06/13/20  1853     06/13/20 1203  cephALEXin (KEFLEX) capsule 500 mg        500 mg Oral Every 6 hours 06/13/20 1203 06/15/20 1159   06/10/20 1900  cefTRIAXone (ROCEPHIN) 1 g in sodium chloride 0.9 % 100 mL IVPB  Status:  Discontinued        1 g 200 mL/hr over 30 Minutes Intravenous Every 24 hours 06/10/20 1855 06/13/20 1203       Objective: Vitals:   06/13/20 2215 06/13/20 2243 06/14/20 0546 06/14/20 0728  BP:  125/67  (!) 160/55  Pulse: (!) 56 62  67  Resp:  18 18 18   Temp:   98.3 F (36.8 C)  98.3 F (36.8 C)  TempSrc:   Oral Oral  SpO2:  94%  96%  Weight:      Height:        Intake/Output Summary (Last 24 hours) at 06/14/2020 1027 Last data filed at 06/14/2020 0728 Gross per 24 hour  Intake 0 ml  Output --  Net 0 ml   Filed Weights   06/11/20 0904 06/12/20 0430  Weight: 66.4 kg 65.8 kg   Examination: General exam: Appears calm and comfortable  Respiratory system: Clear to auscultation. Respiratory effort normal. Cardiovascular system: S1 & S2 heard, RRR, normal sinus rhythm rate 60. No pedal edema. Gastrointestinal system: Abdomen is nondistended, soft and nontender. Normal bowel sounds heard. Central nervous system: Alert and oriented. Non focal exam. Speech clear  Extremities: Right foot in brace Psychiatry: Judgement and insight appear stable. Mood & affect appropriate.    Data Reviewed: I have personally reviewed following labs and imaging studies  CBC: Recent Labs  Lab 06/10/20 1303 06/11/20 0333 06/12/20 0257 06/13/20 0330 06/14/20 0450  WBC 15.0* 13.9* 14.7* 15.3* 13.9*  NEUTROABS 11.1*  --   --   --   --   HGB 14.6 13.5 14.6 13.7 13.2  HCT 41.9 39.7 41.4 39.8 38.2  MCV 92.3 93.0 90.2 90.9 91.0  PLT 287 207 203 223 756   Basic Metabolic Panel: Recent Labs  Lab 06/10/20 1303 06/11/20 0333  NA 139 140  K 4.6 3.6  CL 101 103  CO2 23 23  GLUCOSE 160* 173*  BUN 17 18  CREATININE 0.84 0.94  CALCIUM 9.4 9.2   GFR: Estimated Creatinine Clearance: 39.9 mL/min (by C-G formula based on SCr of 0.94 mg/dL). Liver Function Tests: No results for input(s): AST, ALT, ALKPHOS, BILITOT, PROT, ALBUMIN in the last 168 hours. No results for input(s): LIPASE, AMYLASE in the last 168 hours. No results for input(s): AMMONIA in the last 168 hours. Coagulation Profile: Recent Labs  Lab 06/10/20 1943 06/11/20 0333 06/12/20 0257 06/13/20 0330 06/14/20 0450  INR 2.4* 2.2* 1.6* 1.3* 1.3*   Cardiac Enzymes: No results for input(s): CKTOTAL, CKMB,  CKMBINDEX, TROPONINI in the last 168 hours. BNP (last 3 results) No results for input(s): PROBNP in the last 8760 hours. HbA1C: No results for input(s): HGBA1C in the last 72 hours. CBG: Recent Labs  Lab 06/13/20 0816 06/13/20 1141 06/13/20 1646 06/13/20 2145 06/14/20 0726  GLUCAP 152* 256* 185* 243* 177*   Lipid Profile: No results for input(s): CHOL, HDL, LDLCALC, TRIG, CHOLHDL, LDLDIRECT in the last 72 hours. Thyroid Function Tests: No results for input(s): TSH, T4TOTAL, FREET4, T3FREE, THYROIDAB in the last 72 hours. Anemia Panel: No results for input(s): VITAMINB12, FOLATE, FERRITIN, TIBC, IRON, RETICCTPCT in the last 72 hours. Sepsis Labs: No results for input(s): PROCALCITON, LATICACIDVEN in the  last 168 hours.  Recent Results (from the past 240 hour(s))  Resp Panel by RT-PCR (Flu A&B, Covid) Nasopharyngeal Swab     Status: None   Collection Time: 06/10/20  3:18 PM   Specimen: Nasopharyngeal Swab; Nasopharyngeal(NP) swabs in vial transport medium  Result Value Ref Range Status   SARS Coronavirus 2 by RT PCR NEGATIVE NEGATIVE Final    Comment: (NOTE) SARS-CoV-2 target nucleic acids are NOT DETECTED.  The SARS-CoV-2 RNA is generally detectable in upper respiratory specimens during the acute phase of infection. The lowest concentration of SARS-CoV-2 viral copies this assay can detect is 138 copies/mL. A negative result does not preclude SARS-Cov-2 infection and should not be used as the sole basis for treatment or other patient management decisions. A negative result may occur with  improper specimen collection/handling, submission of specimen other than nasopharyngeal swab, presence of viral mutation(s) within the areas targeted by this assay, and inadequate number of viral copies(<138 copies/mL). A negative result must be combined with clinical observations, patient history, and epidemiological information. The expected result is Negative.  Fact Sheet for Patients:   EntrepreneurPulse.com.au  Fact Sheet for Healthcare Providers:  IncredibleEmployment.be  This test is no t yet approved or cleared by the [place] FDA and  has been authorized for detection and/or diagnosis of SARS-CoV-2 by FDA under an Emergency Use Authorization (EUA). This EUA will remain  in effect (meaning this test can be used) for the duration of the COVID-19 declaration under Section 564(b)(1) of the Act, 21 U.S.C.section 360bbb-3(b)(1), unless the authorization is terminated  or revoked sooner.       Influenza A by PCR NEGATIVE NEGATIVE Final   Influenza B by PCR NEGATIVE NEGATIVE Final    Comment: (NOTE) The Xpert Xpress SARS-CoV-2/FLU/RSV plus assay is intended as an aid in the diagnosis of influenza from Nasopharyngeal swab specimens and should not be used as a sole basis for treatment. Nasal washings and aspirates are unacceptable for Xpert Xpress SARS-CoV-2/FLU/RSV testing.  Fact Sheet for Patients: EntrepreneurPulse.com.au  Fact Sheet for Healthcare Providers: IncredibleEmployment.be  This test is not yet approved or cleared by the [place] FDA and has been authorized for detection and/or diagnosis of SARS-CoV-2 by FDA under an Emergency Use Authorization (EUA). This EUA will remain in effect (meaning this test can be used) for the duration of the COVID-19 declaration under Section 564(b)(1) of the Act, 21 U.S.C. section 360bbb-3(b)(1), unless the authorization is terminated or revoked.  Performed at [facility], [place] [address] [place], [place] [address]   Culture, Urine     Status: Abnormal   Collection Time: 06/10/20  3:18 PM   Specimen: Urine, Random  Result Value Ref Range Status   Specimen Description URINE, RANDOM  Final   Special Requests   Final    NONE Performed at [facility], [place] [address], [place], [place] [address]    Culture >=100,000  COLONIES/mL ESCHERICHIA COLI (A)  Final   Report Status 06/13/2020 FINAL  Final   Organism ID, Bacteria ESCHERICHIA COLI (A)  Final      Susceptibility   Escherichia coli - MIC*    AMPICILLIN <=2 SENSITIVE Sensitive     CEFAZOLIN <=4 SENSITIVE Sensitive     CEFEPIME <=0.12 SENSITIVE Sensitive     CEFTRIAXONE <=0.25 SENSITIVE Sensitive     CIPROFLOXACIN <=0.25 SENSITIVE Sensitive     GENTAMICIN <=1 SENSITIVE Sensitive     IMIPENEM <=0.25 SENSITIVE Sensitive     NITROFURANTOIN <=16  SENSITIVE Sensitive     TRIMETH/SULFA <=20 SENSITIVE Sensitive     AMPICILLIN/SULBACTAM <=2 SENSITIVE Sensitive     PIP/TAZO <=4 SENSITIVE Sensitive     * >=100,000 COLONIES/mL ESCHERICHIA COLI  Surgical PCR screen     Status: None   Collection Time: 06/11/20  3:11 PM   Specimen: Nasal Mucosa; Nasal Swab  Result Value Ref Range Status   MRSA, PCR NEGATIVE NEGATIVE Final   Staphylococcus aureus NEGATIVE NEGATIVE Final    Comment: (NOTE) The Xpert SA Assay (FDA approved for NASAL specimens in patients 42 years of age and older), is one component of a comprehensive surveillance program. It is not intended to diagnose infection nor to guide or monitor treatment. Performed at [facility], [place] [address] [place],  [address]       Radiology Studies: No results found.    Scheduled Meds: . anastrozole  1 mg Oral Daily  . cephALEXin  500 mg Oral Q6H  . docusate sodium  100 mg Oral BID  . feeding supplement (GLUCERNA SHAKE)  237 mL Oral TID BM  . hydrochlorothiazide  25 mg Oral Daily  . insulin aspart  0-9 Units Subcutaneous TID WC  . losartan  100 mg Oral Daily  . memantine  10 mg Oral BID  . metoprolol tartrate  25 mg Oral BID  . multivitamin with minerals  1 tablet Oral Daily   Continuous Infusions: . sodium chloride 250 mL (06/11/20 1701)  .  ceFAZolin (ANCEF) IV    . heparin 850 Units/hr (06/14/20 0552)     LOS: 4 days      Time spent: 25 minutes   Dessa Phi,  DO [facility] 06/14/2020, 10:27 AM   Available via Epic secure chat 7am-7pm After these hours, please refer to coverage provider listed on [email or web address]

## 2020-06-14 NOTE — Anesthesia Procedure Notes (Signed)
Anesthesia Regional Block: Adductor canal block   Pre-Anesthetic Checklist: ,, timeout performed, Correct Patient, Correct Site, Correct Laterality, Correct Procedure, Correct Position, site marked, Risks and benefits discussed,  Surgical consent,  Pre-op evaluation,  At surgeon's request and post-op pain management  Laterality: Right and Lower  Prep: chloraprep       Needles:  Injection technique: Single-shot  Needle Type: Echogenic Needle     Needle Length: 9cm  Needle Gauge: 21     Additional Needles:   Procedures:,,,, ultrasound used (permanent image in chart),,,,  Narrative:  Start time: 06/14/2020 4:23 PM End time: 06/14/2020 4:29 PM Injection made incrementally with aspirations every 5 mL.  Performed by: Personally  Anesthesiologist: Annye Asa, MD  Additional Notes: Pt identified in Holding room.  Monitors applied. Working IV access confirmed. Sterile prep R thigh.  #21ga ECHOgenic Arrow block needle into adductor canal with US guidance.  20cc 0.5% Bupivacaine with 1:200k epi injected incrementally after negative test dose.  Patient asymptomatic, VSS, no heme aspirated, tolerated well.  Jenita Seashore, MD

## 2020-06-14 NOTE — Progress Notes (Signed)
2 rings given to PACU charge RN.

## 2020-06-14 NOTE — Anesthesia Procedure Notes (Signed)
Procedure Name: LMA Insertion Performed by: Cleda Daub, CRNA Pre-anesthesia Checklist: Patient identified, Emergency Drugs available, Suction available and Patient being monitored Patient Re-evaluated:Patient Re-evaluated prior to induction Oxygen Delivery Method: Circle system utilized Preoxygenation: Pre-oxygenation with 100% oxygen Induction Type: IV induction LMA: LMA inserted LMA Size: 4.0 Tube secured with: Tape Dental Injury: Teeth and Oropharynx as per pre-operative assessment

## 2020-06-14 NOTE — Anesthesia Postprocedure Evaluation (Signed)
Anesthesia Post Note  Patient: Cynthia Mathis  Procedure(s) Performed: OPEN REDUCTION INTERNAL FIXATION (ORIF) ANKLE FRACTURE (Right Ankle)     Patient location during evaluation: PACU Anesthesia Type: General Level of consciousness: awake and alert Pain management: pain level controlled Vital Signs Assessment: post-procedure vital signs reviewed and stable Respiratory status: spontaneous breathing, nonlabored ventilation, respiratory function stable and patient connected to nasal cannula oxygen Cardiovascular status: blood pressure returned to baseline and stable Postop Assessment: no apparent nausea or vomiting Anesthetic complications: no   No complications documented.  Last Vitals:  Vitals:   06/14/20 1915 06/14/20 1930  BP: 97/85 (!) 153/68  Pulse: 64 64  Resp: 20 15  Temp:    SpO2: 97% 94%    Last Pain:  Vitals:   06/14/20 1845  TempSrc:   PainSc: Asleep                 Effie Berkshire

## 2020-06-14 NOTE — Discharge Instructions (Signed)
    1. Keep splint clean and dry 2. Elevate foot above level of the heart

## 2020-06-14 NOTE — Transfer of Care (Signed)
Immediate Anesthesia Transfer of Care Note  Patient: Cynthia Mathis  Procedure(s) Performed: OPEN REDUCTION INTERNAL FIXATION (ORIF) ANKLE FRACTURE (Right Ankle)  Patient Location: PACU  Anesthesia Type:GA combined with regional for post-op pain  Level of Consciousness: awake, alert , oriented and patient cooperative  Airway & Oxygen Therapy: Patient Spontanous Breathing and Patient connected to face mask oxygen  Post-op Assessment: Report given to RN and Post -op Vital signs reviewed and stable  Post vital signs: Reviewed and stable  Last Vitals:  Vitals Value Taken Time  BP 140/60 06/14/20 1814  Temp    Pulse 61 06/14/20 1816  Resp 18 06/14/20 1816  SpO2 100 % 06/14/20 1816  Vitals shown include unvalidated device data.  Last Pain:  Vitals:   06/14/20 1516  TempSrc:   PainSc: 0-No pain      Patients Stated Pain Goal: 2 (25/52/58 9483)  Complications: No complications documented.

## 2020-06-14 NOTE — Progress Notes (Addendum)
I evaluated patient at bedside today.  The swelling has improved and at this point is ready for surgical repair this afternoon.  All questions answered.  Patient son at bedside.  All parties in agreement with the plan.  Nurse is aware that heparin drip is to be turned off at 1130 today.

## 2020-06-15 ENCOUNTER — Encounter (INDEPENDENT_AMBULATORY_CARE_PROVIDER_SITE_OTHER): Payer: Medicare Other | Admitting: Ophthalmology

## 2020-06-15 ENCOUNTER — Encounter (HOSPITAL_COMMUNITY): Payer: Self-pay | Admitting: Orthopaedic Surgery

## 2020-06-15 DIAGNOSIS — I480 Paroxysmal atrial fibrillation: Secondary | ICD-10-CM | POA: Diagnosis not present

## 2020-06-15 DIAGNOSIS — S82851A Displaced trimalleolar fracture of right lower leg, initial encounter for closed fracture: Secondary | ICD-10-CM | POA: Diagnosis not present

## 2020-06-15 DIAGNOSIS — R55 Syncope and collapse: Secondary | ICD-10-CM | POA: Diagnosis not present

## 2020-06-15 DIAGNOSIS — R001 Bradycardia, unspecified: Secondary | ICD-10-CM | POA: Diagnosis not present

## 2020-06-15 LAB — CBC
HCT: 37.1 % (ref 36.0–46.0)
Hemoglobin: 12.9 g/dL (ref 12.0–15.0)
MCH: 31.5 pg (ref 26.0–34.0)
MCHC: 34.8 g/dL (ref 30.0–36.0)
MCV: 90.7 fL (ref 80.0–100.0)
Platelets: 229 10*3/uL (ref 150–400)
RBC: 4.09 MIL/uL (ref 3.87–5.11)
RDW: 12.2 % (ref 11.5–15.5)
WBC: 14.1 10*3/uL — ABNORMAL HIGH (ref 4.0–10.5)
nRBC: 0 % (ref 0.0–0.2)

## 2020-06-15 LAB — GLUCOSE, CAPILLARY
Glucose-Capillary: 162 mg/dL — ABNORMAL HIGH (ref 70–99)
Glucose-Capillary: 208 mg/dL — ABNORMAL HIGH (ref 70–99)
Glucose-Capillary: 175 mg/dL — ABNORMAL HIGH (ref 70–99)
Glucose-Capillary: 199 mg/dL — ABNORMAL HIGH (ref 70–99)
Glucose-Capillary: 176 mg/dL — ABNORMAL HIGH (ref 70–99)

## 2020-06-15 LAB — PROTIME-INR
INR: 1.2 (ref 0.8–1.2)
Prothrombin Time: 14.7 seconds (ref 11.4–15.2)

## 2020-06-15 MED ORDER — WARFARIN SODIUM 2.5 MG PO TABS
2.5000 mg | ORAL_TABLET | Freq: Once | ORAL | Status: AC
  Administered 2020-06-15: 2.5 mg via ORAL
  Filled 2020-06-15: qty 1

## 2020-06-15 MED ORDER — WARFARIN - PHARMACIST DOSING INPATIENT: Freq: Every day | Status: DC

## 2020-06-15 NOTE — Progress Notes (Addendum)
[place] for Warfarin Indication: atrial fibrillation  Allergies  Allergen Reactions  . Epinephrine Palpitations    heart racing    Patient Measurements: Height: 5\' 3"  (160 cm) Weight: 65.8 kg (145 lb) IBW/kg (Calculated) : 52.4 Heparin Dosing Weight: 66.4kg  Vital Signs: Temp: 98.4 F (36.9 C) (12/07 0751) Temp Source: Oral (12/07 0751) BP: 134/62 (12/07 0751) Pulse Rate: 68 (12/07 0751)  Labs: Recent Labs    06/12/20 2259 06/13/20 0330 06/13/20 0330 06/13/20 0925 06/14/20 0450 06/15/20 0554  HGB  --  13.7   < >  --  13.2 12.9  HCT  --  39.8  --   --  38.2 37.1  PLT  --  223  --   --  222 229  LABPROT  --  15.3*  --   --  15.3* 14.7  INR  --  1.3*  --   --  1.3* 1.2  HEPARINUNFRC <0.10*  --   --  0.52 0.82*  --    < > = values in this interval not displayed.    Estimated Creatinine Clearance: 39.9 mL/min (by C-G formula based on SCr of 0.94 mg/dL).   Medical History: Past Medical History:  Diagnosis Date  . Anxiety   . Borderline diabetes mellitus   . Cancer ([place]) 2020   breast  . Diverticulosis of colon   . DJD (degenerative joint disease)   . Dyslipidemia   . History of hematuria   . History of osteoporosis   . Hx of colonic polyps   . Hypertension   . Hypothyroid   . Macular degeneration    sees [name] for general eye care, sees [name] for retinal injections   . Memory loss   . Osteoporosis    took bisphosphonates, now off. Last DEXA in 2015 showed osteopenia   . Paroxysmal atrial fibrillation ([facility])    sees [name]   . SCCA (squamous cell carcinoma) of skin 12/11/2019   Left Hand Posterior (in situ)    Medications:  Scheduled:  . anastrozole  1 mg Oral Daily  . docusate sodium  100 mg Oral BID  . feeding supplement (GLUCERNA SHAKE)  237 mL Oral TID BM  . hydrochlorothiazide  25 mg Oral Daily  . insulin aspart  0-9 Units Subcutaneous TID WC  . losartan  100 mg Oral Daily  .  memantine  10 mg Oral BID  . metoprolol tartrate  12.5 mg Oral BID  . multivitamin with minerals  1 tablet Oral Daily    Assessment: Patient is a 86 yo female admitted after passing out. Patient was found to have ankle fracture s/p OR on 12/6 for repair. Patient takes warfarin prior to admission for Afib which has been held while inpatient. Pharmacy consulted to resume warfarin. S/p vitamin K 5mg  po on 12/3. Today, INR 1.2 is subtherapeutic. Hgb stable, plt wnl.   Prior to admission patient is on warfarin 2.5mg  daily.    Goal of Therapy:  INR 2-3 Monitor platelets by anticoagulation protocol: Yes   Plan:  No need to bridge with heparin given indication for Afib per discussion with Dr. Maylene Roes Warfarin 2.5mg  x1 tonight Monitor INR, CBC and S/S of bleeding daily   Cristela Felt, PharmD Clinical Pharmacist

## 2020-06-15 NOTE — Evaluation (Signed)
Physical Therapy Evaluation Patient Details Name: Cynthia Mathis MRN: [id number] DOB: [birth year] Today's Date: 06/15/2020   History of Present Illness  86 yo admitted after syncope at home with resultant Rt ankle fx s/p ORIF, pt with bradycardia on admission. PMHx: HTN, Afib, DM, osteoporosis, breast CA, anxiety, macular degeneration  Clinical Impression  Pt very pleasant but frustrated that her bed was wet since she did not get assistance quickly to toilet. Pt able to tolerate transition from supine to sitting, pivot and short distance of gait. Pt normally lives alone but reports she will stay with son at D/C who unfortunately as 10 steps to enter home. Pt will need WC for functional mobility, community and to bump up stairs to enter home as well as RW for gait and progressive mobility. Pt with decreased transfers, gait and functional mobility limited by fatigue with pain 3-4/10 with denial for need for medication. PT will benefit from acute therapy to maximize mobility, independence and safety. Encouraged mobility to [facility] with nursing and OOB for meals.   HR 76 SpO2 95% on RA    Follow Up Recommendations Home health PT;Supervision/Assistance - 24 hour    Equipment Recommendations  Rolling walker with 5" wheels;3in1 (PT);Wheelchair (measurements PT)    Recommendations for Other Services OT consult     Precautions / Restrictions Precautions Precautions: Fall Restrictions Weight Bearing Restrictions: Yes RLE Weight Bearing: Non weight bearing      Mobility  Bed Mobility Overal bed mobility: Needs Assistance Bed Mobility: Supine to Sit     Supine to sit: Min assist     General bed mobility comments: min assist to move RLE off bed and HHA to elevate trunk    Transfers Overall transfer level: Needs assistance   Transfers: Sit to/from Stand;Stand Pivot Transfers Sit to Stand: Min guard Stand pivot transfers: Min guard       General transfer comment: guarding for RLE  maintaining NWB, cues for hand placement with pt able to rise from bed and recliner with cues. min assist to rise from chair without armrests with UE hooking. pt performed stand pivot bed to chair with minguard and cues for sequence and NWB  Ambulation/Gait Ambulation/Gait assistance: Min guard Gait Distance (Feet): 12 Feet Assistive device: Rolling walker (2 wheeled) Gait Pattern/deviations: Step-to pattern   Gait velocity interpretation: <1.8 ft/sec, indicate of risk for recurrent falls General Gait Details: pt with good adherence to NWB, limited by fatigue with gait  Stairs            Wheelchair Mobility    Modified Rankin (Stroke Patients Only)       Balance Overall balance assessment: Needs assistance   Sitting balance-Leahy Scale: Good     Standing balance support: Bilateral upper extremity supported Standing balance-Leahy Scale: Poor Standing balance comment: UE support to maintain NWB status                             Pertinent Vitals/Pain Pain Assessment: 0-10 Pain Score: 3  Pain Location: Rt ankle Pain Descriptors / Indicators: Aching;Burning Pain Intervention(s): Limited activity within patient's tolerance;Monitored during session;Repositioned (pt denied need for pain medication)    Home Living Family/patient expects to be discharged to:: Private residence Living Arrangements: Alone Available Help at Discharge: Family;Available 24 hours/day Type of Home: House Home Access: Stairs to enter   [employer] of Steps: pt has 2 stairs, son has 10 Home Layout: One level Home Equipment: None  Prior Function Level of Independence: Independent               Hand Dominance        Extremity/Trunk Assessment   Upper Extremity Assessment Upper Extremity Assessment: Overall WFL for tasks assessed    Lower Extremity Assessment Lower Extremity Assessment: RLE deficits/detail RLE Deficits / Details: limited by splinting  and NWB status    Cervical / Trunk Assessment Cervical / Trunk Assessment: Normal  Communication   Communication: HOH  Cognition Arousal/Alertness: Awake/alert Behavior During Therapy: WFL for tasks assessed/performed Overall Cognitive Status: Within Functional Limits for tasks assessed                                        General Comments      Exercises     Assessment/Plan    PT Assessment Patient needs continued PT services  PT Problem List Decreased strength;Decreased mobility;Decreased activity tolerance;Decreased knowledge of use of DME;Decreased balance;Pain       PT Treatment Interventions DME instruction;Therapeutic exercise;Gait training;Stair training;Functional mobility training;Cognitive remediation;Therapeutic activities;Patient/family education;Balance training    PT Goals (Current goals can be found in the Care Plan section)  Acute Rehab PT Goals Patient Stated Goal: 0930 PT Goal Formulation: With patient Time For Goal Achievement: 06/29/20 Potential to Achieve Goals: Good    Frequency Min 5X/week   Barriers to discharge        Co-evaluation               AM-PAC PT "6 Clicks" Mobility  Outcome Measure Help needed turning from your back to your side while in a flat bed without using bedrails?: A Little Help needed moving from lying on your back to sitting on the side of a flat bed without using bedrails?: A Little Help needed moving to and from a bed to a chair (including a wheelchair)?: A Little Help needed standing up from a chair using your arms (e.g., wheelchair or bedside chair)?: A Little Help needed to walk in hospital room?: A Little Help needed climbing 3-5 steps with a railing? : A Lot 6 Click Score: 17    End of Session Equipment Utilized During Treatment: Gait belt Activity Tolerance: Patient tolerated treatment well Patient left: in chair;with call bell/phone within reach;with nursing/sitter in room Nurse  Communication: Mobility status;Weight bearing status PT Visit Diagnosis: Other abnormalities of gait and mobility (R26.89);Difficulty in walking, not elsewhere classified (R26.2);Muscle weakness (generalized) (M62.81);Pain Pain - Right/Left: Right Pain - part of body: Ankle and joints of foot    Time: 7939-0300 PT Time Calculation (min) (ACUTE ONLY): 24 min   Charges:   PT Evaluation $PT Eval Moderate Complexity: 1 Mod PT Treatments $Gait Training: 8-22 mins        Cornell Bourbon P, PT Acute Rehabilitation Services Pager: [phone] Office: [place] 06/15/2020, 11:33 AM

## 2020-06-15 NOTE — Progress Notes (Signed)
Progress Note  Patient Name: Cynthia Mathis Date of Encounter: 06/15/2020  [facility] Cardiologist: Ena Dawley, MD   Subjective   Feeling well.  No chest pain or shortness of breath.   Inpatient Medications    Scheduled Meds: . anastrozole  1 mg Oral Daily  . docusate sodium  100 mg Oral BID  . feeding supplement (GLUCERNA SHAKE)  237 mL Oral TID BM  . hydrochlorothiazide  25 mg Oral Daily  . insulin aspart  0-9 Units Subcutaneous TID WC  . losartan  100 mg Oral Daily  . memantine  10 mg Oral BID  . metoprolol tartrate  12.5 mg Oral BID  . multivitamin with minerals  1 tablet Oral Daily  . warfarin  2.5 mg Oral ONCE-1600  . Warfarin - Pharmacist Dosing Inpatient   Does not apply q1600   Continuous Infusions: . sodium chloride 250 mL (06/11/20 1701)  .  ceFAZolin (ANCEF) IV 2 g (06/15/20 0957)   PRN Meds: sodium chloride, HYDROcodone-acetaminophen, metoprolol tartrate, morphine injection   Vital Signs    Vitals:   06/14/20 2348 06/15/20 0252 06/15/20 0751 06/15/20 1002  BP: 140/62 136/65 134/62   Pulse: 63 63 68 74  Resp: 16 18 17    Temp: 98.2 F (36.8 C) 98.6 F (37 C) 98.4 F (36.9 C)   TempSrc: Oral Oral Oral   SpO2: 95% 96% 98% 95%  Weight:      Height:        Intake/Output Summary (Last 24 hours) at 06/15/2020 1529 Last data filed at 06/15/2020 0900 Gross per 24 hour  Intake 1140 ml  Output 50 ml  Net 1090 ml   Last 3 Weights 06/12/2020 06/11/2020 05/10/2020  Weight (lbs) 145 lb 146 lb 6.2 oz 146 lb 4.8 oz  Weight (kg) 65.772 kg 66.4 kg 66.361 kg      Telemetry    Sinus rhythm.  No events.  - Personally Reviewed  ECG    n/a - Personally Reviewed  Physical Exam   VS:  BP 133/65 (BP Location: Right Arm)   Pulse 75   Temp 98.2 F (36.8 C) (Oral)   Resp 17   Ht 5\' 3"  (1.6 m)   Wt 65.8 kg   SpO2 96%   BMI 25.69 kg/m  , BMI Body mass index is 25.69 kg/m. GENERAL:  Well appearing HEENT: Pupils equal round and reactive, fundi  not visualized, oral mucosa unremarkable NECK:  No jugular venous distention, waveform within normal limits, carotid upstroke brisk and symmetric, no bruits, no thyromegaly LYMPHATICS:  No cervical adenopathy LUNGS:  Clear to auscultation bilaterally HEART:  RRR.  PMI not displaced or sustained,S1 and S2 within normal limits, no S3, no S4, no clicks, no rubs, no murmurs ABD:  Flat, positive bowel sounds normal in frequency in pitch, no bruits, no rebound, no guarding, no midline pulsatile mass, no hepatomegaly, no splenomegaly EXT:  2 plus pulses throughout, no edema, no cyanosis no clubbing SKIN:  No rashes no nodules NEURO:  Cranial nerves II through XII grossly intact, motor grossly intact throughout PSYCH:  Cognitively intact, oriented to person place and time   Labs    High Sensitivity Troponin:  No results for input(s): TROPONINIHS in the last 720 hours.    Chemistry Recent Labs  Lab 06/10/20 1303 06/11/20 0333  NA 139 140  K 4.6 3.6  CL 101 103  CO2 23 23  GLUCOSE 160* 173*  BUN 17 18  CREATININE 0.84 0.94  CALCIUM  9.4 9.2  GFRNONAA >60 59*  ANIONGAP 15 14     Hematology Recent Labs  Lab 06/13/20 0330 06/14/20 0450 06/15/20 0554  WBC 15.3* 13.9* 14.1*  RBC 4.38 4.20 4.09  HGB 13.7 13.2 12.9  HCT 39.8 38.2 37.1  MCV 90.9 91.0 90.7  MCH 31.3 31.4 31.5  MCHC 34.4 34.6 34.8  RDW 12.4 12.4 12.2  PLT 223 222 229    BNPNo results for input(s): BNP, PROBNP in the last 168 hours.   DDimer No results for input(s): DDIMER in the last 168 hours.   Radiology    DG Ankle Complete Right  Result Date: 06/14/2020 CLINICAL DATA:  ORIF surgery EXAM: RIGHT ANKLE - COMPLETE 3+ VIEW; DG C-ARM 1-60 MIN COMPARISON:  None. FINDINGS: The patient is status post plate screw fixation of the distal fibula and medial malleolus. Three intraop views were submitted. Fluoro time 50 seconds IMPRESSION: Intraop ORIF images ankle fixation. Electronically Signed   By: Prudencio Pair M.D.    On: 06/14/2020 19:36   DG C-Arm 1-60 Min  Result Date: 06/14/2020 CLINICAL DATA:  ORIF surgery EXAM: RIGHT ANKLE - COMPLETE 3+ VIEW; DG C-ARM 1-60 MIN COMPARISON:  None. FINDINGS: The patient is status post plate screw fixation of the distal fibula and medial malleolus. Three intraop views were submitted. Fluoro time 50 seconds IMPRESSION: Intraop ORIF images ankle fixation. Electronically Signed   By: Prudencio Pair M.D.   On: 06/14/2020 19:36    Cardiac Studies   Echo 06/11/20: 1. Left ventricular ejection fraction, by estimation, is 65 to 70%. The  left ventricle has normal function. The left ventricle has no regional  wall motion abnormalities. Left ventricular diastolic parameters are  indeterminate.  2. Right ventricular systolic function is normal. The right ventricular  size is normal. There is normal pulmonary artery systolic pressure.  3. The mitral valve is normal in structure. No evidence of mitral valve  regurgitation. No evidence of mitral stenosis.  4. The aortic valve is normal in structure. Aortic valve regurgitation is  trivial. No aortic stenosis is present.   Patient Profile   Cynthia Mathis is an 23F with paroxysmal atrial fibrillation and SSS admitted with syncope and ankle fracture.   Assessment & Plan    # PAF:  # SSS:  Patient is currently in sinus rhythm.  Heart rate is improved after reducing metoprolol.  When in atrial fibrillation she has struggled with RVR.  She is an afib clinic patient.  She has diltiazem to take as needed but has not needed it in the past.  Given that we are reducing her metoprolol, able to get for her to have this on hand as needed.  She is resuming warfarin today.  We will plan to have her wear a 30-day event monitor at discharge.  Please alert [place] on the day of discharge so that we can arrange this in the morning.  INR goal is 2-3.  #Hypertension: Blood pressure stable on HCTZ and losartan.  # Dispo: We will follow from [facility].  She is  stable for discharge from a cardiology perspective.         For questions or updates, please contact [place] Please consult [email or web address] for contact info under        Signed, Skeet Latch, MD  06/15/2020, 3:29 PM

## 2020-06-15 NOTE — Progress Notes (Signed)
The two visitors listed above are the ONLY designated visitors allowed to see the patient during this hospital admission.

## 2020-06-15 NOTE — Progress Notes (Addendum)
PROGRESS NOTE    Cynthia Mathis  HQI:[id number] DOB: 08-15-1932 DOA: 06/10/2020 PCP: [name], MD     Brief Narrative:  Cynthia Mathis is an 86 y.o. female with medical history significant of hypertension, paroxysmal atrial fibrillation on Coumadin, diet-controlled diabetes mellitus type 2, and osteoporosis who presents after reportedly passing out at home.  Patient reports that she had gotten up early that morning and was talking with her houseguest in the kitchen.  She went and use the restroom and had a bowel movement.  The next thing she recalls was waking up on the floor with pain on her right ankle.  She is unsure of how long she was out and does not know if she hit her head.  Denies fever, chills, lightheadedness, palpitations, chest pain, shortness of breath, leg swelling, diarrhea, nausea, vomiting, dysuria, urinary frequency new medications, or any changes to her medications.  At baseline she lives alone, is able to walk without assistance, and complete all of her ADLs.  She does have to have company over at the time and they called 911.  Patient notes that normally her heart rates are higher than the 40s. She was found to have right bimalleolar fracture of the ankle. Due to bradycardia and syncopal episode, cardiology was consulted. Cardizem and metoprolol were held. However, she experienced A fib RVR and metoprolol was resumed. Patient underwent surgical fixation of right ankle fracture by Dr. Erlinda Hong 12/6.   New events last 24 hours / Subjective: Patient worked with physical therapy this morning.  Feeling well overall, sitting in recliner.  Assessment & Plan:   Principal Problem:   Closed displaced trimalleolar fracture of right ankle Active Problems:   Osteopenia   Paroxysmal atrial fibrillation (HCC)   Mild cognitive impairment   Leukocytosis   Bradycardia   Syncope   Right ankle fracture -Per orthopedic surgery -Status post surgical fixation by Dr. Erlinda Hong 12/6 -PT OT  recommending home health  Syncope and bradycardia -Initially held metoprolol and Cardizem -Continue telemetry -Appreciate cardiology   E. coli UTI , present on admission -De-escalate to Keflex.  Completed treatment  Paroxysmal atrial fibrillation -Resume Coumadin -Had A. fib RVR and Lopressor restarted -Normal sinus rhythm this morning  Essential hypertension -Continue HCTZ/cozaar   Type 2 diabetes, well controlled -Hemoglobin A1c 7.4 -Sliding scale insulin  Mild cognitive impairment -Hold Aricept due to bradycardia -Continue namenda   History of breast cancer -Continue anastrozole  Hypothyroidism -Mildly elevated free T4 1.13, low TSH 0.319. Repeat labs as outpatient in 4-6 weeks   DVT prophylaxis: Resume Coumadin   Code Status: Full code Family Communication: No family at bedside Disposition Plan:  Status is: Inpatient  Remains inpatient appropriate because:Inpatient level of care appropriate due to severity of illness   Dispo: The patient is from: Home              Anticipated d/c is to: Home              Anticipated d/c date is: 1 day              Patient currently is not medically stable to d/c.  PT OT today.  Hopeful discharge home if no further cardiology recommendations/intervention 12/8.    Consultants:   Orthopedic surgery  Cardiology  Procedures:   None  Antimicrobials:  Anti-infectives (From admission, onward)   Start     Dose/Rate Route Frequency Ordered Stop   06/15/20 0000  ceFAZolin (ANCEF) IVPB 2g/100 mL premix  Note to Pharmacy: Anesthesia to give preop   2 g 200 mL/hr over 30 Minutes Intravenous Every 8 hours 06/14/20 1950 06/15/20 2359   06/13/20 1945  ceFAZolin (ANCEF) IVPB 2g/100 mL premix       Note to Pharmacy: Anesthesia to give preop   2 g 200 mL/hr over 30 Minutes Intravenous  Once 06/13/20 1853 06/14/20 1654   06/13/20 1203  cephALEXin (KEFLEX) capsule 500 mg  Status:  Discontinued        500 mg Oral Every 6  hours 06/13/20 1203 06/15/20 0102   06/10/20 1900  cefTRIAXone (ROCEPHIN) 1 g in sodium chloride 0.9 % 100 mL IVPB  Status:  Discontinued        1 g 200 mL/hr over 30 Minutes Intravenous Every 24 hours 06/10/20 1855 06/13/20 1203       Objective: Vitals:   06/14/20 2348 06/15/20 0252 06/15/20 0751 06/15/20 1002  BP: 140/62 136/65 134/62   Pulse: 63 63 68 74  Resp: 16 18 17    Temp: 98.2 F (36.8 C) 98.6 F (37 C) 98.4 F (36.9 C)   TempSrc: Oral Oral Oral   SpO2: 95% 96% 98% 95%  Weight:      Height:        Intake/Output Summary (Last 24 hours) at 06/15/2020 1254 Last data filed at 06/15/2020 0900 Gross per 24 hour  Intake 1140 ml  Output 50 ml  Net 1090 ml   Filed Weights   06/11/20 0904 06/12/20 0430  Weight: 66.4 kg 65.8 kg   Examination: General exam: Appears calm and comfortable  Respiratory system: Clear to auscultation. Respiratory effort normal. Cardiovascular system: S1 & S2 heard, RRR. No pedal edema. Gastrointestinal system: Abdomen is nondistended, soft and nontender. Normal bowel sounds heard. Central nervous system: Alert and oriented. Non focal exam. Speech clear  Extremities: Right lower extremity in cast Psychiatry: Judgement and insight appear stable. Mood & affect appropriate.   Data Reviewed: I have personally reviewed following labs and imaging studies  CBC: Recent Labs  Lab 06/10/20 1303 06/10/20 1303 06/11/20 0333 06/12/20 0257 06/13/20 0330 06/14/20 0450 06/15/20 0554  WBC 15.0*   < > 13.9* 14.7* 15.3* 13.9* 14.1*  NEUTROABS 11.1*  --   --   --   --   --   --   HGB 14.6   < > 13.5 14.6 13.7 13.2 12.9  HCT 41.9   < > 39.7 41.4 39.8 38.2 37.1  MCV 92.3   < > 93.0 90.2 90.9 91.0 90.7  PLT 287   < > 207 203 223 222 229   < > = values in this interval not displayed.   Basic Metabolic Panel: Recent Labs  Lab 06/10/20 1303 06/11/20 0333  NA 139 140  K 4.6 3.6  CL 101 103  CO2 23 23  GLUCOSE 160* 173*  BUN 17 18  CREATININE 0.84  0.94  CALCIUM 9.4 9.2   GFR: Estimated Creatinine Clearance: 39.9 mL/min (by C-G formula based on SCr of 0.94 mg/dL). Liver Function Tests: No results for input(s): AST, ALT, ALKPHOS, BILITOT, PROT, ALBUMIN in the last 168 hours. No results for input(s): LIPASE, AMYLASE in the last 168 hours. No results for input(s): AMMONIA in the last 168 hours. Coagulation Profile: Recent Labs  Lab 06/11/20 0333 06/12/20 0257 06/13/20 0330 06/14/20 0450 06/15/20 0554  INR 2.2* 1.6* 1.3* 1.3* 1.2   Cardiac Enzymes: No results for input(s): CKTOTAL, CKMB, CKMBINDEX, TROPONINI in the last 168 hours. BNP (last  3 results) No results for input(s): PROBNP in the last 8760 hours. HbA1C: No results for input(s): HGBA1C in the last 72 hours. CBG: Recent Labs  Lab 06/14/20 1157 06/14/20 1523 06/14/20 1954 06/15/20 0633 06/15/20 1149  GLUCAP 124* 108* 183* 162* 208*   Lipid Profile: No results for input(s): CHOL, HDL, LDLCALC, TRIG, CHOLHDL, LDLDIRECT in the last 72 hours. Thyroid Function Tests: No results for input(s): TSH, T4TOTAL, FREET4, T3FREE, THYROIDAB in the last 72 hours. Anemia Panel: No results for input(s): VITAMINB12, FOLATE, FERRITIN, TIBC, IRON, RETICCTPCT in the last 72 hours. Sepsis Labs: No results for input(s): PROCALCITON, LATICACIDVEN in the last 168 hours.  Recent Results (from the past 240 hour(s))  Resp Panel by RT-PCR (Flu A&B, Covid) Nasopharyngeal Swab     Status: None   Collection Time: 06/10/20  3:18 PM   Specimen: Nasopharyngeal Swab; Nasopharyngeal(NP) swabs in vial transport medium  Result Value Ref Range Status   SARS Coronavirus 2 by RT PCR NEGATIVE NEGATIVE Final    Comment: (NOTE) SARS-CoV-2 target nucleic acids are NOT DETECTED.  The SARS-CoV-2 RNA is generally detectable in upper respiratory specimens during the acute phase of infection. The lowest concentration of SARS-CoV-2 viral copies this assay can detect is 138 copies/mL. A negative result  does not preclude SARS-Cov-2 infection and should not be used as the sole basis for treatment or other patient management decisions. A negative result may occur with  improper specimen collection/handling, submission of specimen other than nasopharyngeal swab, presence of viral mutation(s) within the areas targeted by this assay, and inadequate number of viral copies(<138 copies/mL). A negative result must be combined with clinical observations, patient history, and epidemiological information. The expected result is Negative.  Fact Sheet for Patients:  EntrepreneurPulse.com.au  Fact Sheet for Healthcare Providers:  IncredibleEmployment.be  This test is no t yet approved or cleared by the [place] FDA and  has been authorized for detection and/or diagnosis of SARS-CoV-2 by FDA under an Emergency Use Authorization (EUA). This EUA will remain  in effect (meaning this test can be used) for the duration of the COVID-19 declaration under Section 564(b)(1) of the Act, 21 U.S.C.section 360bbb-3(b)(1), unless the authorization is terminated  or revoked sooner.       Influenza A by PCR NEGATIVE NEGATIVE Final   Influenza B by PCR NEGATIVE NEGATIVE Final    Comment: (NOTE) The Xpert Xpress SARS-CoV-2/FLU/RSV plus assay is intended as an aid in the diagnosis of influenza from Nasopharyngeal swab specimens and should not be used as a sole basis for treatment. Nasal washings and aspirates are unacceptable for Xpert Xpress SARS-CoV-2/FLU/RSV testing.  Fact Sheet for Patients: EntrepreneurPulse.com.au  Fact Sheet for Healthcare Providers: IncredibleEmployment.be  This test is not yet approved or cleared by the [place] FDA and has been authorized for detection and/or diagnosis of SARS-CoV-2 by FDA under an Emergency Use Authorization (EUA). This EUA will remain in effect (meaning this test can be used) for  the duration of the COVID-19 declaration under Section 564(b)(1) of the Act, 21 U.S.C. section 360bbb-3(b)(1), unless the authorization is terminated or revoked.  Performed at [facility], [place] [address], [place], [place] [address]   Culture, Urine     Status: Abnormal   Collection Time: 06/10/20  3:18 PM   Specimen: Urine, Random  Result Value Ref Range Status   Specimen Description URINE, RANDOM  Final   Special Requests   Final    NONE Performed at [facility], [address] [place], [place] [address]    Culture >=100,000 COLONIES/mL ESCHERICHIA COLI (A)  Final   Report Status 06/13/2020 FINAL  Final   Organism ID, Bacteria ESCHERICHIA COLI (A)  Final      Susceptibility   Escherichia coli - MIC*    AMPICILLIN <=2 SENSITIVE Sensitive     CEFAZOLIN <=4 SENSITIVE Sensitive     CEFEPIME <=0.12 SENSITIVE Sensitive     CEFTRIAXONE <=0.25 SENSITIVE Sensitive     CIPROFLOXACIN <=0.25 SENSITIVE Sensitive     GENTAMICIN <=1 SENSITIVE Sensitive     IMIPENEM <=0.25 SENSITIVE Sensitive     NITROFURANTOIN <=16 SENSITIVE Sensitive     TRIMETH/SULFA <=20 SENSITIVE Sensitive     AMPICILLIN/SULBACTAM <=2 SENSITIVE Sensitive     PIP/TAZO <=4 SENSITIVE Sensitive     * >=100,000 COLONIES/mL ESCHERICHIA COLI  Surgical PCR screen     Status: None   Collection Time: 06/11/20  3:11 PM   Specimen: Nasal Mucosa; Nasal Swab  Result Value Ref Range Status   MRSA, PCR NEGATIVE NEGATIVE Final   Staphylococcus aureus NEGATIVE NEGATIVE Final    Comment: (NOTE) The Xpert SA Assay (FDA approved for NASAL specimens in patients 93 years of age and older), is one component of a comprehensive surveillance program. It is not intended to diagnose infection nor to guide or monitor treatment. Performed at [facility], [place] [address] [place], [place] [address]       Radiology Studies: DG Ankle Complete Right  Result Date: 06/14/2020 CLINICAL DATA:  ORIF surgery EXAM: RIGHT ANKLE  - COMPLETE 3+ VIEW; DG C-ARM 1-60 MIN COMPARISON:  None. FINDINGS: The patient is status post plate screw fixation of the distal fibula and medial malleolus. Three intraop views were submitted. Fluoro time 50 seconds IMPRESSION: Intraop ORIF images ankle fixation. Electronically Signed   By: Prudencio Pair M.D.   On: 06/14/2020 19:36   DG C-Arm 1-60 Min  Result Date: 06/14/2020 CLINICAL DATA:  ORIF surgery EXAM: RIGHT ANKLE - COMPLETE 3+ VIEW; DG C-ARM 1-60 MIN COMPARISON:  None. FINDINGS: The patient is status post plate screw fixation of the distal fibula and medial malleolus. Three intraop views were submitted. Fluoro time 50 seconds IMPRESSION: Intraop ORIF images ankle fixation. Electronically Signed   By: Prudencio Pair M.D.   On: 06/14/2020 19:36      Scheduled Meds: . anastrozole  1 mg Oral Daily  . docusate sodium  100 mg Oral BID  . feeding supplement (GLUCERNA SHAKE)  237 mL Oral TID BM  . hydrochlorothiazide  25 mg Oral Daily  . insulin aspart  0-9 Units Subcutaneous TID WC  . losartan  100 mg Oral Daily  . memantine  10 mg Oral BID  . metoprolol tartrate  12.5 mg Oral BID  . multivitamin with minerals  1 tablet Oral Daily  . warfarin  2.5 mg Oral ONCE-1600  . Warfarin - Pharmacist Dosing Inpatient   Does not apply q1600   Continuous Infusions: . sodium chloride 250 mL (06/11/20 1701)  .  ceFAZolin (ANCEF) IV 2 g (06/15/20 0957)     LOS: 5 days      Time spent: 25 minutes   Dessa Phi, DO [facility] 06/15/2020, 12:54 PM   Available via Epic secure chat 7am-7pm After these hours, please refer to coverage provider listed on [email or web address]

## 2020-06-15 NOTE — Progress Notes (Signed)
Subjective: 1 Day Post-Op Procedure(s) (LRB): OPEN REDUCTION INTERNAL FIXATION (ORIF) ANKLE FRACTURE (Right) Patient reports pain as mild.    Objective: Vital signs in last 24 hours: Temp:  [97.7 F (36.5 C)-98.6 F (37 C)] 98.6 F (37 C) (12/07 0252) Pulse Rate:  [53-88] 63 (12/07 0252) Resp:  [13-20] 18 (12/07 0252) BP: (97-164)/(55-85) 136/65 (12/07 0252) SpO2:  [91 %-100 %] 96 % (12/07 0252)  Intake/Output from previous day: 12/06 0701 - 12/07 0700 In: 900 [I.V.:700; IV Piggyback:200] Out: 50 [Blood:50] Intake/Output this shift: No intake/output data recorded.  Recent Labs    06/13/20 0330 06/14/20 0450 06/15/20 0554  HGB 13.7 13.2 12.9   Recent Labs    06/14/20 0450 06/15/20 0554  WBC 13.9* 14.1*  RBC 4.20 4.09  HCT 38.2 37.1  PLT 222 229   No results for input(s): NA, K, CL, CO2, BUN, CREATININE, GLUCOSE, CALCIUM in the last 72 hours. Recent Labs    06/14/20 0450 06/15/20 0554  INR 1.3* 1.2    Neurologically intact Neurovascular intact Sensation intact distally Intact pulses distally Dorsiflexion/Plantar flexion intact Incision: dressing C/D/I No cellulitis present Compartment soft   Assessment/Plan: 1 Day Post-Op Procedure(s) (LRB): OPEN REDUCTION INTERNAL FIXATION (ORIF) ANKLE FRACTURE (Right) Up with therapy NWB RLE Elevate as much as possible for pain and swelling F/u in two weeks with dr. Kerin Ransom 06/15/2020, 7:19 AM

## 2020-06-15 NOTE — Progress Notes (Signed)
Patient's son took home wedding rings

## 2020-06-15 NOTE — Progress Notes (Signed)
Nutrition Follow-up  DOCUMENTATION CODES:   Not applicable  INTERVENTION:   -Continue MVI with minerals daily -Continue Glucerna Shake po TID, each supplement provides 220 kcal and 10 grams of protein  NUTRITION DIAGNOSIS:   Increased nutrient needs related to acute illness (ankle fracture) as evidenced by estimated needs.  Ongoing  GOAL:   Patient will meet greater than or equal to 90% of their needs  Progressing   MONITOR:   PO intake, Supplement acceptance, Labs  REASON FOR ASSESSMENT:   Consult Hip fracture protocol  ASSESSMENT:   86 yo female admitted with bimalleolar fracture of the right foot S/P fall at home. PMH includes macular degeneration, HTN, HLD, DM (diet controlled), diverticulosis, DJD, osteoporosis, memory loss, breast cancer, skin cancer.  12/6- s/p Open treatment of right ankle fracture with internal fixation. Trimalleolar w/o fixation of posterior malleolus  Reviewed I/O's: +850 ml x 24 hours and +803 ml since admission  Pt unavailable at times of attempted contacts.   Pt with variable intake; noted meal completion 25-100%. Pt is consuming Glucerna supplements.   Medications reviewed and include colace.   Per therapy notes, recommending home health services at discharge.   Labs reviewed: CBGS: [id number] (inpatient orders for glycemic control are none).   Diet Order:   Diet Order            Diet Heart Room service appropriate? Yes; Fluid consistency: Thin  Diet effective now                 EDUCATION NEEDS:   Not appropriate for education at this time  Skin:  Skin Assessment: Skin Integrity Issues: Skin Integrity Issues:: Incisions Incisions: closed rt ankle  Last BM:  06/13/20  Height:   Ht Readings from Last 1 Encounters:  06/11/20 5\' 3"  (1.6 m)    Weight:   Wt Readings from Last 1 Encounters:  06/12/20 65.8 kg    Ideal Body Weight:  52.3 kg  BMI:  Body mass index is 25.69 kg/m.  Estimated Nutritional Needs:    Kcal:  1650-1850  Protein:  80-90 gm  Fluid:  >/= 1.7 L    Loistine Chance, RD, LDN, [place] Registered Dietitian II Certified Diabetes Care and Education Specialist Please refer to [facility] for RD and/or RD on-call/weekend/after hours pager

## 2020-06-16 ENCOUNTER — Other Ambulatory Visit: Payer: Self-pay | Admitting: Physician Assistant

## 2020-06-16 DIAGNOSIS — R001 Bradycardia, unspecified: Secondary | ICD-10-CM

## 2020-06-16 DIAGNOSIS — R55 Syncope and collapse: Secondary | ICD-10-CM

## 2020-06-16 DIAGNOSIS — I480 Paroxysmal atrial fibrillation: Secondary | ICD-10-CM

## 2020-06-16 LAB — CBC
HCT: 36.2 % (ref 36.0–46.0)
Hemoglobin: 12.4 g/dL (ref 12.0–15.0)
MCH: 31.5 pg (ref 26.0–34.0)
MCHC: 34.3 g/dL (ref 30.0–36.0)
MCV: 91.9 fL (ref 80.0–100.0)
Platelets: 236 10*3/uL (ref 150–400)
RBC: 3.94 MIL/uL (ref 3.87–5.11)
RDW: 12.4 % (ref 11.5–15.5)
WBC: 12.3 10*3/uL — ABNORMAL HIGH (ref 4.0–10.5)
nRBC: 0 % (ref 0.0–0.2)

## 2020-06-16 LAB — GLUCOSE, CAPILLARY
Glucose-Capillary: 158 mg/dL — ABNORMAL HIGH (ref 70–99)
Glucose-Capillary: 167 mg/dL — ABNORMAL HIGH (ref 70–99)
Glucose-Capillary: 207 mg/dL — ABNORMAL HIGH (ref 70–99)
Glucose-Capillary: 224 mg/dL — ABNORMAL HIGH (ref 70–99)

## 2020-06-16 LAB — PROTIME-INR
INR: 1.2 (ref 0.8–1.2)
Prothrombin Time: 14.6 seconds (ref 11.4–15.2)

## 2020-06-16 MED ORDER — WARFARIN SODIUM 2.5 MG PO TABS
2.5000 mg | ORAL_TABLET | Freq: Once | ORAL | Status: AC
  Administered 2020-06-16: 2.5 mg via ORAL
  Filled 2020-06-16: qty 1

## 2020-06-16 MED ORDER — HYDROCODONE-ACETAMINOPHEN 5-325 MG PO TABS: 1.0000 | ORAL_TABLET | Freq: Four times a day (QID) | ORAL | 0 refills | Status: DC | PRN

## 2020-06-16 MED ORDER — METOPROLOL TARTRATE 25 MG PO TABS
12.5000 mg | ORAL_TABLET | Freq: Two times a day (BID) | ORAL | 0 refills | Status: DC
Start: 2020-06-16 — End: 2020-07-19

## 2020-06-16 MED FILL — HYDROCODON-APAP 5-325: 5-325 | 2 days supply | Qty: 20 | Fill #0

## 2020-06-16 MED FILL — METOPROLOL TARTRATE 25 MG T: 25 | 60 days supply | Qty: 60 | Fill #0

## 2020-06-16 NOTE — Progress Notes (Signed)
Physical Therapy Treatment Patient Details Name: Cynthia Mathis MRN: [id number] DOB: 1932-11-30 Today's Date: 06/16/2020    History of Present Illness 86 yo admitted after syncope at home with resultant Rt ankle fx s/p ORIF, pt with bradycardia on admission. PMHx: HTN, Afib, DM, osteoporosis, breast CA, anxiety, macular degeneration    PT Comments    Pt tolerates treatment well but is unable to transfer without significant physical assistance. Pt demonstrates poor memory and understanding of WB precautions, requiring frequent cues and physical assistance to maintain R foot off the floor during all standing or transfer activities. Pt will benefit from continued acute PT POC to improve strength and transfer quality while reducing falls risk. Due to increased assistance needs PT recommends CIR placement at this time. The pt has 24/7 supervision available at home and demonstrates the potential to reach a supervision level for transfers and wheelchair mobility with high intensity inpatient therapies.   Follow Up Recommendations  CIR     Equipment Recommendations  Rolling walker with 5" wheels;Wheelchair (measurements PT);Wheelchair cushion (measurements PT)    Recommendations for Other Services       Precautions / Restrictions Precautions Precautions: Fall Restrictions Weight Bearing Restrictions: Yes RLE Weight Bearing: Non weight bearing    Mobility  Bed Mobility Overal bed mobility: Needs Assistance Bed Mobility: Supine to Sit     Supine to sit: Supervision;HOB elevated        Transfers Overall transfer level: Needs assistance Equipment used: Rolling walker (2 wheeled);1 person hand held assist Transfers: Sit to/from [facility] Sit to Stand: Mod assist Stand pivot transfers: Mod assist       General transfer comment: pt requires modA to stand, is unable to use RW to stand while maintaining NWB RLE. Pt often attempts to step on right foot when pivoting  during transfers  Ambulation/Gait                 Stairs             Wheelchair Mobility    Modified Rankin (Stroke Patients Only)       Balance Overall balance assessment: Needs assistance Sitting-balance support: No upper extremity supported;Feet supported Sitting balance-Leahy Scale: Fair     Standing balance support: Bilateral upper extremity supported Standing balance-Leahy Scale: Poor Standing balance comment: minA, constant cues needed to maintain WB status                            Cognition Arousal/Alertness: Awake/alert Behavior During Therapy: WFL for tasks assessed/performed Overall Cognitive Status: No family/caregiver present to determine baseline cognitive functioning Area of Impairment: Memory;Safety/judgement;Awareness                     Memory: Decreased recall of precautions;Decreased short-term memory   Safety/Judgement: Decreased awareness of deficits;Decreased awareness of safety Awareness: Emergent   General Comments: pt requires frequent cues for WB status, often putting some weight through RLE despite PT cues to maintain foot off floor      Exercises      General Comments General comments (skin integrity, edema, etc.): VSS on RA      Pertinent Vitals/Pain Pain Assessment: Faces Faces Pain Scale: Hurts little more Pain Location: R ankle Pain Descriptors / Indicators: Grimacing Pain Intervention(s): Monitored during session    Home Living                      Prior Function  PT Goals (current goals can now be found in the care plan section) Acute Rehab PT Goals Patient Stated Goal: improve mobility Progress towards PT goals: Not progressing toward goals - comment (poor recall of WB status)    Frequency    Min 5X/week      PT Plan Discharge plan needs to be updated    Co-evaluation              AM-PAC PT "6 Clicks" Mobility   Outcome Measure  Help needed  turning from your back to your side while in a flat bed without using bedrails?: A Little Help needed moving from lying on your back to sitting on the side of a flat bed without using bedrails?: A Little Help needed moving to and from a bed to a chair (including a wheelchair)?: A Lot Help needed standing up from a chair using your arms (e.g., wheelchair or bedside chair)?: A Lot Help needed to walk in hospital room?: A Lot Help needed climbing 3-5 steps with a railing? : A Lot 6 Click Score: 14    End of Session   Activity Tolerance: Patient tolerated treatment well Patient left: in chair;with call bell/phone within reach;with chair alarm set Nurse Communication: Mobility status;Need for lift equipment (STEDY) PT Visit Diagnosis: Other abnormalities of gait and mobility (R26.89);Difficulty in walking, not elsewhere classified (R26.2);Muscle weakness (generalized) (M62.81);Pain Pain - Right/Left: Right Pain - part of body: Ankle and joints of foot     Time: 2257-5051 PT Time Calculation (min) (ACUTE ONLY): 48 min  Charges:  $Therapeutic Activity: 38-52 mins                     Zenaida Niece, PT, DPT Acute Rehabilitation Pager: [phone]    Zenaida Niece 06/16/2020, 3:35 PM

## 2020-06-16 NOTE — Plan of Care (Signed)
  Problem: Education: Goal: Knowledge of General Education information will improve Description: Including pain rating scale, medication(s)/side effects and non-pharmacologic comfort measures Outcome: Adequate for Discharge   Problem: Health Behavior/Discharge Planning: Goal: Ability to manage health-related needs will improve Outcome: Adequate for Discharge   Problem: Clinical Measurements: Goal: Ability to maintain clinical measurements within normal limits will improve Outcome: Adequate for Discharge Goal: Will remain free from infection Outcome: Adequate for Discharge Goal: Diagnostic test results will improve Outcome: Adequate for Discharge Goal: Respiratory complications will improve Outcome: Adequate for Discharge Goal: Cardiovascular complication will be avoided Outcome: Adequate for Discharge   Problem: Activity: Goal: Risk for activity intolerance will decrease Outcome: Adequate for Discharge   Problem: Nutrition: Goal: Adequate nutrition will be maintained Outcome: Adequate for Discharge   Problem: Coping: Goal: Level of anxiety will decrease Outcome: Adequate for Discharge   Problem: Elimination: Goal: Will not experience complications related to bowel motility Outcome: Adequate for Discharge Goal: Will not experience complications related to urinary retention Outcome: Adequate for Discharge   Problem: Pain Managment: Goal: General experience of comfort will improve Outcome: Adequate for Discharge   Problem: Safety: Goal: Ability to remain free from injury will improve Outcome: Adequate for Discharge   Problem: Skin Integrity: Goal: Risk for impaired skin integrity will decrease Outcome: Adequate for Discharge   Problem: Education: Goal: Knowledge of condition and prescribed therapy will improve Outcome: Adequate for Discharge   Problem: Cardiac: Goal: Will achieve and/or maintain adequate cardiac output Outcome: Adequate for Discharge   Problem:  Physical Regulation: Goal: Complications related to the disease process, condition or treatment will be avoided or minimized Outcome: Adequate for Discharge   

## 2020-06-16 NOTE — Plan of Care (Signed)
  Problem: Education: Goal: Knowledge of General Education information will improve Description: Including pain rating scale, medication(s)/side effects and non-pharmacologic comfort measures Outcome: Progressing   Problem: Health Behavior/Discharge Planning: Goal: Ability to manage health-related needs will improve Outcome: Progressing   Problem: Clinical Measurements: Goal: Ability to maintain clinical measurements within normal limits will improve Outcome: Progressing   Problem: Nutrition: Goal: Adequate nutrition will be maintained Outcome: Progressing   Problem: Coping: Goal: Level of anxiety will decrease Outcome: Progressing   Problem: Safety: Goal: Ability to remain free from injury will improve Outcome: Progressing   Problem: Skin Integrity: Goal: Risk for impaired skin integrity will decrease Outcome: Progressing   

## 2020-06-16 NOTE — Care Management Important Message (Signed)
Important Message  Patient Details  Name: Cynthia Mathis MRN: [id number] Date of Birth: May 16, 1933   Medicare Important Message Given:  Yes     Cynthia Mathis P Cynthia Mathis 06/16/2020, 2:10 PM

## 2020-06-16 NOTE — Evaluation (Signed)
Occupational Therapy Evaluation Patient Details Name: Cynthia Mathis MRN: [id number] DOB: 1933-06-30 Today's Date: 06/16/2020    History of Present Illness 86 yo admitted after syncope at home with resultant Rt ankle fx s/p ORIF, pt with bradycardia on admission. PMHx: HTN, Afib, DM, osteoporosis, breast CA, anxiety, macular degeneration   Clinical Impression   PTA, pt typically lives alone and reports Independence in all daily tasks without use of AD. Pt plans to discharge to son's home while recovering, which has 10 steps to enter. Pt reports good family support at home. Son's home is wheelchair accessible indoors as pt's son also uses a wheelchair. Pt overall Supervision for bed mobility, min guard for sit to stand transfer and Min A for pivot transfers using RW. Pt requires cues for safe RW use and assistance to maintain stability. Pt overall Min A for LB ADLs with education provided on compensatory strategies and safety techniques. Recommend HHOT follow-up at discharge and 24/7 support. Plan to further address balance during LB ADLs during next session.     Follow Up Recommendations  Home health OT;Supervision/Assistance - 24 hour    Equipment Recommendations  Tub/shower bench;3 in 1 bedside commode;Other (comment);Wheelchair (measurements OT);Wheelchair cushion (measurements OT) (RW)    Recommendations for Other Services       Precautions / Restrictions Precautions Precautions: Fall Restrictions Weight Bearing Restrictions: Yes RLE Weight Bearing: Non weight bearing      Mobility Bed Mobility Overal bed mobility: Needs Assistance Bed Mobility: Supine to Sit;Sit to Supine     Supine to sit: Supervision;HOB elevated Sit to supine: Supervision   General bed mobility comments: Supervision with increased time/effort to sit EOB. heavy use of bedrail    Transfers Overall transfer level: Needs assistance Equipment used: Rolling walker (2 wheeled) Transfers: Sit to/from  [facility] Sit to Stand: Min guard Stand pivot transfers: Min assist       General transfer comment: min guard for power up with cues for hand placement, MIn A for pivot to [facility] and assist to manuever RW    Balance Overall balance assessment: Needs assistance Sitting-balance support: No upper extremity supported;Feet supported Sitting balance-Leahy Scale: Good     Standing balance support: Bilateral upper extremity supported Standing balance-Leahy Scale: Poor Standing balance comment: UE support to maintain NWB status                           ADL either performed or assessed with clinical judgement   ADL Overall ADL's : Needs assistance/impaired Eating/Feeding: Independent;Sitting   Grooming: Set up;Sitting   Upper Body Bathing: Set up;Sitting   Lower Body Bathing: Minimal assistance;Sitting/lateral leans;Sit to/from stand   Upper Body Dressing : Set up;Sitting   Lower Body Dressing: Minimal assistance;Sit to/from stand;Sitting/lateral leans   Toilet Transfer: Minimal assistance;BSC;RW;Stand-pivot Toilet Transfer Details (indicate cue type and reason): MIn A with assistance to sequence RW and cues for safety Toileting- Clothing Manipulation and Hygiene: Minimal assistance;Sitting/lateral lean;Sit to/from stand Toileting - Clothing Manipulation Details (indicate cue type and reason): Min A for posterior hygiene in standing. unable to reach with lateral leans on BSC     Functional mobility during ADLs: Minimal assistance;Cueing for sequencing;Cueing for safety;Rolling walker General ADL Comments: Pt limited by pain, balance deficits due to NWB precautions     Vision Baseline Vision/History: No visual deficits Patient Visual Report: No change from baseline Vision Assessment?: No apparent visual deficits     Perception     Praxis  Pertinent Vitals/Pain Pain Assessment: 0-10 Pain Score: 5  Pain Location: Rt ankle Pain Descriptors /  Indicators: Burning;Sore Pain Intervention(s): Monitored during session;Repositioned     Hand Dominance Right   Extremity/Trunk Assessment Upper Extremity Assessment Upper Extremity Assessment: Overall WFL for tasks assessed   Lower Extremity Assessment Lower Extremity Assessment: Defer to PT evaluation   Cervical / Trunk Assessment Cervical / Trunk Assessment: Normal   Communication Communication Communication: HOH   Cognition Arousal/Alertness: Awake/alert Behavior During Therapy: WFL for tasks assessed/performed Overall Cognitive Status: No family/caregiver present to determine baseline cognitive functioning Area of Impairment: Memory                     Memory: Decreased short-term memory         General Comments: unable to initially recall cause of fall   General Comments  VSS on RA    Exercises     Shoulder Instructions      Home Living Family/patient expects to be discharged to:: Private residence Living Arrangements: Alone Available Help at Discharge: Family;Available 24 hours/day Type of Home: House Home Access: Stairs to enter [employer] of Steps: pt has 2 stairs, son has 10   Home Layout: One level     Bathroom Shower/Tub: Tub/shower unit;Walk-in shower (walk in shower at pt's home, tub/shower at [employer])   [employer]: Standard     Home Equipment: None   Additional Comments: son works from home and uses wheelchair for mobility, Son has wife and grandkids that can assist too. Pt may have access to wheelchair, shower chair that husband had       Prior Functioning/Environment Level of Independence: Independent        Comments: no use of AD, Independent and active         OT Problem List: Decreased activity tolerance;Impaired balance (sitting and/or standing);Decreased knowledge of use of DME or AE;Decreased knowledge of precautions;Pain      OT Treatment/Interventions: Self-care/ADL training;Therapeutic  exercise;Energy conservation;DME and/or AE instruction;Therapeutic activities;Patient/family education    OT Goals(Current goals can be found in the care plan section) Acute Rehab OT Goals Patient Stated Goal: feel better, be able to use toilet OT Goal Formulation: With patient Time For Goal Achievement: 06/30/20 Potential to Achieve Goals: Good ADL Goals Pt Will Perform Grooming: with modified independence;standing Pt Will Perform Lower Body Bathing: with modified independence;sit to/from stand;sitting/lateral leans Pt Will Perform Lower Body Dressing: with modified independence;sitting/lateral leans;sit to/from stand Pt Will Transfer to Toilet: with modified independence;ambulating;bedside commode Pt Will Perform Toileting - Clothing Manipulation and hygiene: with modified independence;sitting/lateral leans;sit to/from stand  OT Frequency: Min 2X/week   Barriers to D/C:            Co-evaluation              AM-PAC OT "6 Clicks" Daily Activity     Outcome Measure Help from another person eating meals?: None Help from another person taking care of personal grooming?: A Little Help from another person toileting, which includes using toliet, bedpan, or urinal?: A Little Help from another person bathing (including washing, rinsing, drying)?: A Little Help from another person to put on and taking off regular upper body clothing?: A Little Help from another person to put on and taking off regular lower body clothing?: A Little 6 Click Score: 19   End of Session Equipment Utilized During Treatment: Gait belt;Rolling walker Nurse Communication: Mobility status  Activity Tolerance: Patient tolerated treatment well Patient left: in  bed;with call bell/phone within reach;with bed alarm set  OT Visit Diagnosis: Unsteadiness on feet (R26.81);Other abnormalities of gait and mobility (R26.89);Pain Pain - Right/Left: Right Pain - part of body: Ankle and joints of foot                 Time: 0827-0901 OT Time Calculation (min): 34 min Charges:  OT General Charges $OT Visit: 1 Visit OT Evaluation $OT Eval Low Complexity: 1 Low OT Treatments $Self Care/Home Management : 8-22 mins  Layla Maw, OTR/L  Layla Maw 06/16/2020, 9:11 AM

## 2020-06-16 NOTE — TOC Initial Note (Addendum)
Transition of Care [facility]) - Initial/Assessment Note    Patient Details  Name: Cynthia Mathis MRN: [id number] Date of Birth: [birth year]  Transition of [facility]) CM/SW Contact:    Curlene Labrum, RN Phone Number: 06/16/2020, 1:46 PM  Clinical Narrative:                 Case management met with the patient at the bedside regarding transitions of care to home.  The patient plans to discharge home to her son's house located at [address] [place], [place] [address].  The patient was given choice regarding home health services and the patient did not have a preference.  I called [facility] and left a message to obtain Pt/OT services.  I will confirm this before the patient is discharged home.  The patient will need a RW, WC and 3:1 for home.  I called [employer] and they will be delivering the equipment to the patient's home since the son is wheelchair bound.  I called and gave [employer] the daughter in [place] number Rodena Goldmann - [phone] to arrange payment and delivery of the equipment.  PT is meeting with the patient and son at the bedside for mobility instruction.  Plan is for patient to be discharged tomorrow to home so that the patient can have dme equipment delivered for home use.  Dr. Reesa Chew is aware.   Expected Discharge Plan: [place] Barriers to Discharge: No Barriers Identified   Patient Goals and CMS Choice Patient states their goals for this hospitalization and ongoing recovery are:: Patient plans on discharging home to his son's home in [place], [place] [address]. CMS Medicare.gov Compare Post Acute Care list provided to:: Patient Choice offered to / list presented to : Patient, Adult Children  Expected Discharge Plan and Services Expected Discharge Plan: [place]   Discharge Planning Services: CM Consult Post Acute Care Choice: Durable Medical Equipment, Home Health Living arrangements for the past 2 months: Single Family  Home Expected Discharge Date: 06/16/20               DME Arranged: 3-N-1, Wheelchair manual, Walker rolling, Tub bench DME Agency: [facility] Date DME Agency Contacted: 06/16/20 Time DME Agency Contacted: [id number] Representative spoke with at DME Agency: Sue Lush, [place] with [employer] HH Arranged: PT, OT [place] Agency: [place] Date [place]: 06/16/20 Time [place]: 1344 Representative spoke with at [place]: Tommi Rumps, [facility] - waiting to hear back with confirmation  Prior Living Arrangements/Services Living arrangements for the past 2 months: [place] with:: Self (Plans on discharging home to son's home in [place], [place]) Patient language and need for interpreter reviewed:: Yes Do you feel safe going back to the place where you live?: Yes      Need for Family Participation in Patient Care: Yes (Comment) Care giver support system in place?: Yes (comment)   Criminal Activity/Legal Involvement Pertinent to Current Situation/Hospitalization: No - Comment as needed  Activities of Daily Living Home Assistive Devices/Equipment: None ADL Screening (condition at time of admission) Patient's cognitive ability adequate to safely complete daily activities?: Yes Is the patient deaf or have difficulty hearing?: No Does the patient have difficulty seeing, even when wearing glasses/contacts?: No Does the patient have difficulty concentrating, remembering, or making decisions?: Yes Patient able to express need for assistance with ADLs?: Yes Does the patient have difficulty dressing or bathing?: No Independently performs ADLs?: Yes (appropriate for developmental age) Does  the patient have difficulty walking or climbing stairs?: No Weakness of Legs: Right Weakness of Arms/Hands: None  Permission Sought/Granted Permission sought to share information with : Case Manager Permission granted to share information with : Yes, Verbal Permission Granted     Permission  granted to share info w AGENCY: [employer] for dme, [facility] for home health - waiting for return call for confirmation  Permission granted to share info w Relationship: son [name] - [phone]     Emotional Assessment Appearance:: Appears stated age Attitude/Demeanor/Rapport: Engaged Affect (typically observed): Accepting Orientation: : Oriented to Self, Oriented to Place, Oriented to  Time, Oriented to Situation Alcohol / Substance Use: Not Applicable Psych Involvement: No (comment)  Admission diagnosis:  Syncope [R55] Closed bimalleolar fracture of right ankle, initial encounter [B44.967R] Bimalleolar fracture of right ankle [F16.384Y] Patient Active Problem List   Diagnosis Date Noted  . Closed displaced trimalleolar fracture of right ankle 06/10/2020  . Leukocytosis 06/10/2020  . Bradycardia 06/10/2020  . Syncope 06/10/2020  . Aortic atherosclerosis ([place]) 05/10/2020  . Secondary hypercoagulable state ([place]) 04/23/2020  . Posterior vitreous detachment of right eye 12/02/2019  . Exudative age-related macular degeneration of left eye with active choroidal neovascularization ([place]) 10/28/2019  . Exudative age-related macular degeneration of right eye with active choroidal neovascularization ([place]) 10/28/2019  . Retinal hemorrhage of left eye 10/28/2019  . Malignant neoplasm of upper-outer quadrant of right breast in female, estrogen receptor positive ([place]) 03/26/2019  . Mild cognitive impairment 03/28/2018  . Foot pain, right 12/05/2017  . Hx of colonic polyps   . Diverticulosis of colon   . Anxiety   . Paroxysmal atrial fibrillation ([place]) 08/24/2016  . Low back pain 05/06/2015  . Osteopenia 08/04/2013  . Memory loss 09/17/2012  . 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 Macular degeneration (senile) of retina 08/12/2008  . Osteoarthritis 08/12/2008  . Impaired fasting glucose 02/10/2008  . Hypothyroidism 08/08/2007  . Dyslipidemia 08/08/2007  . Essential hypertension 08/08/2007    PCP:  [name], MD Pharmacy:   [place], [place] - [address] [place] [address] Phone: [phone] Fax: [phone]  [facility] - [place], [place] - [address] [place] [address] Phone: [phone] Fax: [phone]  [facility] [place], [place] - [address] [place] [address] Phone: [phone] Fax: [phone]     Social Determinants of Health (SDOH) Interventions    Readmission Risk Interventions Readmission Risk Prevention Plan 06/16/2020  Post Dischage Appt Complete  Medication Screening Complete  Transportation Screening Complete  Some recent data might be hidden

## 2020-06-16 NOTE — Progress Notes (Signed)
[facility] for Warfarin Indication: atrial fibrillation  Allergies  Allergen Reactions  . Epinephrine Palpitations    heart racing    Patient Measurements: Height: 5\' 3"  (160 cm) Weight: 65.8 kg (145 lb) IBW/kg (Calculated) : 52.4 Heparin Dosing Weight: 66.4kg  Vital Signs: Temp: 98.1 F (36.7 C) (12/08 0743) Temp Source: Oral (12/08 0743) BP: 141/60 (12/08 0743) Pulse Rate: 73 (12/08 0743)  Labs: Recent Labs    06/14/20 0450 06/14/20 0450 06/15/20 0554 06/16/20 0238  HGB 13.2   < > 12.9 12.4  HCT 38.2  --  37.1 36.2  PLT 222  --  229 236  LABPROT 15.3*  --  14.7 14.6  INR 1.3*  --  1.2 1.2  HEPARINUNFRC 0.82*  --   --   --    < > = values in this interval not displayed.    Estimated Creatinine Clearance: 39.9 mL/min (by C-G formula based on SCr of 0.94 mg/dL).   Medical History: Past Medical History:  Diagnosis Date  . Anxiety   . Borderline diabetes mellitus   . Cancer ([place]) 2020   breast  . Diverticulosis of colon   . DJD (degenerative joint disease)   . Dyslipidemia   . History of hematuria   . History of osteoporosis   . Hx of colonic polyps   . Hypertension   . Hypothyroid   . Macular degeneration    sees [name] for general eye care, sees [name] for retinal injections   . Memory loss   . Osteoporosis    took bisphosphonates, now off. Last DEXA in 2015 showed osteopenia   . Paroxysmal atrial fibrillation (HCC)    sees [name]   . SCCA (squamous cell carcinoma) of skin 12/11/2019   Left Hand Posterior (in situ)    Assessment: Patient is a 86 yo female admitted after syncopal episode. Patient was found to have ankle fracture s/p OR on 12/6 for repair. Patient takes warfarin prior to admission for Afib which has been held while inpatient. Admit INR 2.6 therapeutic. Pharmacy consulted to resume warfarin. S/p vitamin K 5mg  po on 12/3.   INR 1.2 remains subtherapeutic as expected, just  restarted warfarin yesterday. Hgb stable, plt wnl. No bridge needed for afib.  Prior to admission patient is on warfarin 2.5mg  daily.    Goal of Therapy:  INR 2-3 Monitor platelets by anticoagulation protocol: Yes   Plan:  Warfarin 2.5mg  x1 tonight Monitor INR, CBC and S/S of bleeding daily  For discharge, resume home warfarin dose 2.5mg  Daily   Benetta Spar, PharmD, BCPS, BCCP Clinical Pharmacist  Please check AMION for all [place] phone numbers After 10:00 PM, call [place]

## 2020-06-16 NOTE — Care Management (Signed)
    Durable Medical Equipment  (From admission, onward)         Start     Ordered   06/16/20 1442  For home use only DME 3 n 1  Once        06/16/20 1441   06/16/20 1441  For home use only DME lightweight manual wheelchair with seat cushion  Once       Comments: Patient suffers from Right ankle fracture which impairs their ability to perform daily activities like [id number] in the home.  A walking WNU:[id number] will not resolve  issue with performing activities of daily living. A wheelchair will allow patient to safely perform daily activities. Patient is not able to propel themselves in the home using a standard weight wheelchair due to [id number]. Patient can self propel in the lightweight wheelchair. Length of need 6 months. Accessories: elevating leg rests (ELRs), wheel locks, extensions and anti-tippers.   06/16/20 1441   06/10/20 1228  For home use only DME Walker rolling  Once       Question Answer Comment  Walker: With [place]   Patient needs a walker to treat with the following condition Fall      06/10/20 1228

## 2020-06-16 NOTE — Plan of Care (Signed)

## 2020-06-16 NOTE — Care Management (Signed)
    Durable Medical Equipment  (From admission, onward)         Start     Ordered   06/10/20 1228  For home use only DME Walker rolling  Once       Question Answer Comment  Walker: With [employer]   Patient needs a walker to treat with the following condition Fall      06/10/20 1228

## 2020-06-16 NOTE — Discharge Summary (Addendum)
Physician Discharge Summary  Cynthia Mathis FKC:[id number] DOB: 1932/09/28 DOA: 06/10/2020  PCP: [name], MD  Admit date: 06/10/2020 Discharge date: 06/19/2020  Admitted From: Home Disposition: SNF  Recommendations for Outpatient Follow-up:  1. Follow up with PCP in 1-2 weeks 2. Please obtain BMP/CBC in one week your next doctors visit.  3. Check INR on Monday, 06/22/2019 4. Metoprolol reduced to 12.5 mg twice daily.  Resume home Coumadin and follow-up outpatient at [facility] 5. Cardiology team to make arrangements for 30-day monitor 6. Follow-up outpatient with orthopedic as recommended by their service 7. Follow-up outpatient thyroid studies in about 4 to 6 weeks   Discharge Condition: Stable CODE STATUS: Full code Diet recommendation: Heart healthy  Brief/Interim Summary: 86 y.o.femalewith medical history significant ofhypertension, paroxysmal atrial fibrillation on Coumadin, diet-controlled diabetes mellitus type 2, and osteoporosis who presents after reportedly passing out at home. Patient reports that she had gotten up early that morning and was talking with her houseguest in the kitchen. She went and use the restroom and had a bowel movement. The nextthing she recalls was waking up on the floor with pain on her right ankle. She is unsure of how long she was out and does not know if she hit her head. Denies fever, chills, lightheadedness, palpitations, chest pain, shortness of breath, leg swelling, diarrhea, nausea, vomiting, dysuria, urinary frequency new medications, or any changes to her medications. At baseline she lives alone, is able to walk without assistance, and complete all of her ADLs. She does have to have company over at the time and they called 911. Patient notes that normally her heart rates are higher than the 40s. She was found to have right bimalleolar fracture of the ankle. Due to bradycardia and syncopal episode, cardiology was consulted.  Cardizem and metoprolol were held. However, she experienced A fib RVR and metoprolol was resumed. Patient underwent surgical fixation of right ankle fracture by Dr. Erlinda Hong 12/6.   Cardiology team lowered patient dose of metoprolol and advised to resume Coumadin.  30-day cardiac monitor arrangements were made.  Nonweightbearing of right lower extremity until follow-up outpatient orthopedic in 2 weeks.  Due to poor progression with PT she was thought to be a good candidate to go to SNF therefore arrangements made.  Spoke with the patient's son over the phone as well while I was in the room.   Assessment & Plan:   Principal Problem:   Closed displaced trimalleolar fracture of right ankle Active Problems:   Osteopenia   Paroxysmal atrial fibrillation (HCC)   Mild cognitive impairment   Leukocytosis   Bradycardia   Syncope   Right ankle fracture -Per orthopedic surgery -Status post surgical fixation by Dr. Erlinda Hong 12/6 -PT OT recommending SNF, nonweightbearing of right lower extremity and follow-up in 2 weeks with their service.  E. coli UTI , present on admission -De-escalate to Keflex.  Completed treatment  Paroxysmal atrial fibrillation with bradycardia -Seen by cardiology reduced dose of metoprolol.  Continue Coumadin.  30-day monitor to be arranged by their service.  Essential hypertension -Continue HCTZ/cozaar   Type 2 diabetes, well controlled -Hemoglobin A1c 7.4 -Resume home regimen  Mild cognitive impairment -Discontinued Aricept for now due to bradycardia.  This can be resumed outpatient by her PCP if appropriate -Continue namenda   History of breast cancer -Continue anastrozole  Hypothyroidism -Mildly elevated free T4 1.13, low TSH 0.319. Repeat labs as outpatient in 4-6 weeks   Body mass index is 25.69 kg/m.  Discharge Diagnoses:  Principal Problem:   Closed displaced trimalleolar fracture of right ankle Active Problems:   Osteopenia   Paroxysmal  atrial fibrillation (HCC)   Mild cognitive impairment   Leukocytosis   Bradycardia   Syncope   Subjective: Feels okay no complaints.  Also spoke with patient's son-in-law was in the room and all the questions have been answered  Discharge Exam: Vitals:   06/19/20 0721 06/19/20 0813  BP: 139/62   Pulse: 61 66  Resp: 17   Temp: 97.8 F (36.6 C)   SpO2: 98%    Vitals:   06/18/20 2023 06/19/20 0300 06/19/20 0721 06/19/20 0813  BP: 128/60 124/62 139/62   Pulse: 68 65 61 66  Resp: 17  17   Temp: 98.1 F (36.7 C) 97.8 F (36.6 C) 97.8 F (36.6 C)   TempSrc:  Oral Oral   SpO2: 96% 97% 98%   Weight:      Height:        General: Pt is alert, awake, not in acute distress Cardiovascular: RRR, S1/S2 +, no rubs, no gallops Respiratory: CTA bilaterally, no wheezing, no rhonchi Abdominal: Soft, NT, ND, bowel sounds + Extremities: no edema, no cyanosis Right ankle dressing noted  Discharge Instructions   Allergies as of 06/19/2020      Reactions   Epinephrine Palpitations   heart racing      Medication List    STOP taking these medications   donepezil 10 MG tablet Commonly known as: ARICEPT   fluorouracil 5 % cream Commonly known as: EFUDEX   metoprolol succinate 100 MG 24 hr tablet Commonly known as: TOPROL-XL     TAKE these medications   anastrozole 1 MG tablet Commonly known as: ARIMIDEX Take 1 tablet (1 mg total) by mouth daily.   Calcium 600+D 600-400 MG-UNIT tablet Generic drug: Calcium Carbonate-Vitamin D Take 1 tablet by mouth daily.   diltiazem 30 MG tablet Commonly known as: Cardizem Take 1 tablet every 4 hours AS NEEDED for afib heart rate >100 as long as top BP >100. What changed:   how much to take  how to take this  when to take this  reasons to take this  additional instructions   docusate sodium 100 MG capsule Commonly known as: COLACE Take 1 capsule (100 mg total) by mouth 2 (two) times daily as needed for mild constipation.    fish oil-omega-3 fatty acids 1000 MG capsule Take 1 g by mouth daily.   GLUCOSAMINE-CHONDROITIN PO Take 1 tablet by mouth 3 (three) times daily.   HYDROcodone-acetaminophen 5-325 MG tablet Commonly known as: NORCO/VICODIN Take 1 tablet by mouth every 6 (six) hours as needed for moderate pain.   losartan-hydrochlorothiazide 100-25 MG tablet Commonly known as: HYZAAR Take 1 tablet by mouth daily.   memantine 10 MG tablet Commonly known as: NAMENDA TAKE ONE TABLET BY MOUTH TWICE A DAY   metoprolol tartrate 25 MG tablet Commonly known as: LOPRESSOR Take 0.5 tablets (12.5 mg total) by mouth 2 (two) times daily. Notes to patient: **NEW** To lower heart rate. *NOTE*: this medication is different than metoprolol SUCCINATE. Stop taking metoprolol succinate and take metoprolol TARTRATE instead    MULTIVITAMIN & MINERAL PO Take 1 tablet by mouth daily.   Vitamin B 12 500 MCG Tabs Take 1,000 mcg by mouth daily.   warfarin 5 MG tablet Commonly known as: COUMADIN Take as directed. If you are unsure how to take this medication, talk to your nurse or doctor. Original instructions: Take  as directed by Coumadin Clinic What changed:   how much to take  how to take this  when to take this  additional instructions            Durable Medical Equipment  (From admission, onward)         Start     Ordered   06/16/20 1442  For home use only DME 3 n 1  Once        06/16/20 1441   06/16/20 1441  For home use only DME lightweight manual wheelchair with seat cushion  Once       Comments: Patient suffers from Right ankle fracture which impairs their ability to perform daily activities like [id number] in the home.  A walking NIO:[id number] will not resolve  issue with performing activities of daily living. A wheelchair will allow patient to safely perform daily activities. Patient is not able to propel themselves in the home using a standard weight wheelchair due to [id number]. Patient can  self propel in the lightweight wheelchair. Length of need 6 months. Accessories: elevating leg rests (ELRs), wheel locks, extensions and anti-tippers.   06/16/20 1441   06/10/20 1228  For home use only DME Walker rolling  Once       Question Answer Comment  Walker: With Long   Patient needs a walker to treat with the following condition Fall      06/10/20 1228          Contact information for follow-up providers    [name], MD In 2 weeks.   Specialty: Orthopedic Surgery Why: For suture removal, For wound re-check Contact information: [place] [id number] [phone]        [name], MD. Schedule an appointment as soon as possible for a visit in 1 week(s).   Specialty: Family Medicine Contact information: [place] [address] [phone]        [name], MD .   Specialty: Cardiology Contact information: [place] [address] [phone]        [facility], [facility] Follow up.   Specialty: Home Health Services Why: [facility] will be providing you with physical therapy, occupational therapy and aide at the home.  They will call you in the next 24-48 hours to set up home therapy times. Contact information: [address] [place] [address] [phone]        [facility], [facility] Follow up.   Why: [facility] will be providing you with a rolling walker, wheelchair, and 3:1.  The rolling walker was delivered to your hospital room and the wheelchair and 3:1 will be delivered to your home. Contact information: [address]  [address] [phone]            Contact information for after-discharge care    Destination    [facility] .   Service: Skilled Nursing Contact information: [place] [phone]                  Allergies  Allergen Reactions  . Epinephrine Palpitations    heart racing    You were cared for by a hospitalist during your hospital stay. If you have any questions about your discharge medications or the care you received while you were in the hospital after you are discharged, you can call the unit and asked  to speak with the hospitalist on call if the hospitalist that took care of you is not available. Once you are discharged, your primary care physician will handle any further medical issues. Please note that no refills for any discharge medications will be authorized once you are discharged, as it is imperative that you return to your primary care physician (or establish a relationship with a primary care physician if you do not have one) for your aftercare needs so that they can reassess your need for medications and monitor your lab values.   Procedures/Studies: DG Ankle Complete Right  Result Date: 06/14/2020 CLINICAL DATA:  ORIF surgery EXAM: RIGHT ANKLE - COMPLETE 3+ VIEW; DG C-ARM 1-60 MIN COMPARISON:  None. FINDINGS: The patient is status post plate screw fixation of the distal fibula and medial malleolus. Three intraop views were submitted. Fluoro time 50 seconds IMPRESSION: Intraop ORIF images ankle fixation. Electronically Signed   By: Prudencio Pair M.D.   On: 06/14/2020 19:36   DG Ankle Complete Right  Result Date: 06/10/2020 CLINICAL DATA:  Status post reduction EXAM: RIGHT ANKLE - COMPLETE 3+ VIEW COMPARISON:  06/10/2020 FINDINGS: Status post close reduction of bimalleolar fracture. The fracture fragments are in near anatomic alignment. Overlying splinting material noted. IMPRESSION: 1. Status post closed reduction of bimalleolar fracture of the right lower extremity 2. Fracture fragments are in near anatomic alignment. Electronically Signed   By: Kerby Moors M.D.   On: 06/10/2020 14:20   DG Ankle Complete Right  Result Date: 06/10/2020 CLINICAL DATA:  Right ankle pain  after a fall. EXAM: RIGHT ANKLE - COMPLETE 3+ VIEW COMPARISON:  Foot radiographs dated 12/05/2017. FINDINGS: There is a comminuted fracture of the distal fibula at or above the level of the tibiofibular syndesmosis. No definite widening of the distal tibiofibular articulation. There is a mildly displaced fracture of the medial malleolus with subluxation of the talar dome laterally. A nondisplaced posterior malleolus fracture is difficult to exclude. There is surrounding soft tissue swelling. Plantar and posterior calcaneal enthesophytes are noted. IMPRESSION: 1. Bimalleolar fracture with mild subluxation of the talar dome laterally. 2. Nondisplaced posterior malleolus fracture is difficult to exclude. Electronically Signed   By: Zerita Boers M.D.   On: 06/10/2020 11:25   CT Head Wo Contrast  Result Date: 06/10/2020 CLINICAL DATA:  Pain following fall.  Syncope. EXAM: CT HEAD WITHOUT CONTRAST TECHNIQUE: Contiguous axial images were obtained from the base of the skull through the vertex without intravenous contrast. COMPARISON:  None. FINDINGS: Brain: There is mild diffuse atrophy. There is no intracranial mass, hemorrhage, extra-axial fluid collection, or midline shift. There is mild small vessel disease in the centra semiovale bilaterally. Elsewhere brain parenchyma appears unremarkable. No acute infarct evident. Vascular: No hyperdense vessel. There is calcification in each carotid siphon region. Skull: Bony calvarium appears intact. Sinuses/Orbits: There is mucosal thickening in the left maxillary antrum. There is mucosal thickening in several ethmoid air cells. Orbits appear symmetric bilaterally. Other: Mastoid air cells are clear. IMPRESSION: Mild atrophy with mild periventricular small vessel disease. No acute infarct. No mass or hemorrhage. There are foci of arterial vascular calcification. There is mild mucosal thickening in several paranasal sinus regions. Electronically Signed   By: Lowella Grip  III M.D.   On: 06/10/2020 11:48   CT ANKLE RIGHT WO CONTRAST  Result Date: 06/10/2020 CLINICAL DATA:  The patient suffered right ankle fractures in a fall today. Initial encounter. EXAM: CT OF THE RIGHT ANKLE WITHOUT CONTRAST TECHNIQUE: Multidetector CT imaging  of the right ankle was performed according to the standard protocol. Multiplanar CT image reconstructions were also generated. COMPARISON:  Plain films right ankle earlier today. FINDINGS: Bones/Joint/Cartilage Acute fracture of the posterior malleolus is seen. The fracture fragment is triangular in shape with the base of the triangle adjacent to the fibula. Fracture fragment measures up to 0.7 cm AP by 2.3 cm transverse at the plafond by 2 cm craniocaudal. The fracture fragment demonstrates slight posterior and lateral displacement. Also seen is a fracture of the medial malleolus. The distal fragment is inferiorly displaced approximately 0.6 cm and demonstrates slight medial displacement. The patient has an acute diaphyseal fracture of the distal fibula. The fracture originates approximately 6.5 cm above the tip of the lateral malleolus. The fracture is mildly comminuted with 3 fragments identified. There is slight fragment override and mild lateral angulation of the distal fragment. Also seen is an avulsion fracture off of the anterior aspect of the lateral malleolus where a fracture fragment measuring approximately 0.6 cm in diameter is anteriorly displaced approximately 0.3 cm. Ligaments Suboptimally assessed by CT. Avulsion fracture off the anterior aspect of the lateral malleolus is at the expected attachment site of the anterior, inferior tibiofibular ligament. As visualized by CT scan, ligamentous structures about the ankle are otherwise unremarkable. Muscles and Tendons Appear intact.  No tendon entrapment. Soft tissues Soft tissue contusion and hematoma about the patient's fractures noted. IMPRESSION: Ankle fractures most consistent with a Weber  C4 injury. Although the anterior inferior tibiofibular ligament is likely intact, there is an avulsion fracture at its attachment site to the fibula. Electronically Signed   By: Inge Rise M.D.   On: 06/10/2020 17:36   Chest Portable 1 View  Result Date: 06/10/2020 CLINICAL DATA:  Syncope, bradycardia EXAM: PORTABLE CHEST 1 VIEW COMPARISON:  04/19/2020 FINDINGS: The heart size and mediastinal contours are within normal limits. Both lungs are clear. The visualized skeletal structures are unremarkable. IMPRESSION: No active disease. Electronically Signed   By: Randa Ngo M.D.   On: 06/10/2020 16:39   DG C-Arm 1-60 Min  Result Date: 06/14/2020 CLINICAL DATA:  ORIF surgery EXAM: RIGHT ANKLE - COMPLETE 3+ VIEW; DG C-ARM 1-60 MIN COMPARISON:  None. FINDINGS: The patient is status post plate screw fixation of the distal fibula and medial malleolus. Three intraop views were submitted. Fluoro time 50 seconds IMPRESSION: Intraop ORIF images ankle fixation. Electronically Signed   By: Prudencio Pair M.D.   On: 06/14/2020 19:36   ECHOCARDIOGRAM COMPLETE  Result Date: 06/11/2020    ECHOCARDIOGRAM REPORT   Patient Name:   Cynthia Mathis Date of Exam: 06/11/2020 Medical Rec #:  [id number]         Height:       63.0 in Accession #:    [id number]        Weight:       146.4 lb Date of Birth:  1933/05/31          BSA:          1.693 m Patient Age:    17 years          BP:           142/60 mmHg Patient Gender: F                 HR:           57 bpm. Exam Location:  Inpatient Procedure: 2D Echo, Cardiac Doppler and Color Doppler Indications:    R55 Syncope  History:  Patient has prior history of Echocardiogram examinations, most                 recent 09/11/2016. Arrythmias:Atrial Fibrillation; Risk                 Factors:Hypertension and Dyslipidemia. Cancer. Hypothyroidism.  Sonographer:    Tiffany Dance Referring Phys: [id number] Harrell  1. Left ventricular ejection fraction, by  estimation, is 65 to 70%. The left ventricle has normal function. The left ventricle has no regional wall motion abnormalities. Left ventricular diastolic parameters are indeterminate.  2. Right ventricular systolic function is normal. The right ventricular size is normal. There is normal pulmonary artery systolic pressure.  3. The mitral valve is normal in structure. No evidence of mitral valve regurgitation. No evidence of mitral stenosis.  4. The aortic valve is normal in structure. Aortic valve regurgitation is trivial. No aortic stenosis is present. FINDINGS  Left Ventricle: Left ventricular ejection fraction, by estimation, is 65 to 70%. The left ventricle has normal function. The left ventricle has no regional wall motion abnormalities. The left ventricular internal cavity size was normal in size. There is  no left ventricular hypertrophy. Left ventricular diastolic parameters are indeterminate. Right Ventricle: The right ventricular size is normal. No increase in right ventricular wall thickness. Right ventricular systolic function is normal. There is normal pulmonary artery systolic pressure. The tricuspid regurgitant velocity is 2.77 m/s, and  with an assumed right atrial pressure of 3 mmHg, the estimated right ventricular systolic pressure is 19.4 mmHg. Left Atrium: Left atrial size was normal in size. Right Atrium: Right atrial size was normal in size. Pericardium: There is no evidence of pericardial effusion. Mitral Valve: The mitral valve is normal in structure. No evidence of mitral valve regurgitation. No evidence of mitral valve stenosis. Tricuspid Valve: The tricuspid valve is normal in structure. Tricuspid valve regurgitation is trivial. Aortic Valve: The aortic valve is normal in structure. Aortic valve regurgitation is trivial. No aortic stenosis is present. Pulmonic Valve: The pulmonic valve was normal in structure. Pulmonic valve regurgitation is not visualized. Aorta: The aortic root and  ascending aorta are structurally normal, with no evidence of dilitation. IAS/Shunts: The atrial septum is grossly normal.  LEFT VENTRICLE PLAX 2D LVIDd:         3.90 cm  Diastology LVIDs:         2.10 cm  LV e' medial:    4.46 cm/s LV PW:         1.20 cm  LV E/e' medial:  14.2 LV IVS:        1.00 cm  LV e' lateral:   5.44 cm/s LVOT diam:     2.00 cm  LV E/e' lateral: 11.7 LV SV:         96 LV SV Index:   57 LVOT Area:     3.14 cm  RIGHT VENTRICLE             IVC RV Basal diam:  2.50 cm     IVC diam: 1.50 cm RV S prime:     18.30 cm/s TAPSE (M-mode): 1.8 cm LEFT ATRIUM           Index       RIGHT ATRIUM          Index LA diam:      3.00 cm 1.77 cm/m  RA Area:     9.60 cm LA Vol (A2C): 39.0 ml 23.03 ml/m RA Volume:  16.20 ml 9.57 ml/m LA Vol (A4C): 32.3 ml 19.07 ml/m  AORTIC VALVE LVOT Vmax:   140.00 cm/s LVOT Vmean:  88.500 cm/s LVOT VTI:    0.307 m  AORTA Ao Root diam: 3.60 cm Ao Asc diam:  3.00 cm MITRAL VALVE               TRICUSPID VALVE MV Area (PHT): 2.11 cm    TR Peak grad:   30.7 mmHg MV Decel Time: 359 msec    TR Vmax:        277.00 cm/s MV E velocity: 63.40 cm/s MV A velocity: 96.80 cm/s  SHUNTS MV E/A ratio:  0.65        Systemic VTI:  0.31 m                            Systemic Diam: 2.00 cm Mertie Moores MD Electronically signed by Mertie Moores MD Signature Date/Time: 06/11/2020/12:55:12 PM    Final      The results of significant diagnostics from this hospitalization (including imaging, microbiology, ancillary and laboratory) are listed below for reference.     Microbiology: Recent Results (from the past 240 hour(s))  Resp Panel by RT-PCR (Flu A&B, Covid) Nasopharyngeal Swab     Status: None   Collection Time: 06/10/20  3:18 PM   Specimen: Nasopharyngeal Swab; Nasopharyngeal(NP) swabs in vial transport medium  Result Value Ref Range Status   SARS Coronavirus 2 by RT PCR NEGATIVE NEGATIVE Final    Comment: (NOTE) SARS-CoV-2 target nucleic acids are NOT DETECTED.  The SARS-CoV-2  RNA is generally detectable in upper respiratory specimens during the acute phase of infection. The lowest concentration of SARS-CoV-2 viral copies this assay can detect is 138 copies/mL. A negative result does not preclude SARS-Cov-2 infection and should not be used as the sole basis for treatment or other patient management decisions. A negative result may occur with  improper specimen collection/handling, submission of specimen other than nasopharyngeal swab, presence of viral mutation(s) within the areas targeted by this assay, and inadequate number of viral copies(<138 copies/mL). A negative result must be combined with clinical observations, patient history, and epidemiological information. The expected result is Negative.  Fact Sheet for Patients:  EntrepreneurPulse.com.au  Fact Sheet for Healthcare Providers:  IncredibleEmployment.be  This test is no t yet approved or cleared by the [place] FDA and  has been authorized for detection and/or diagnosis of SARS-CoV-2 by FDA under an Emergency Use Authorization (EUA). This EUA will remain  in effect (meaning this test can be used) for the duration of the COVID-19 declaration under Section 564(b)(1) of the Act, 21 U.S.C.section 360bbb-3(b)(1), unless the authorization is terminated  or revoked sooner.       Influenza A by PCR NEGATIVE NEGATIVE Final   Influenza B by PCR NEGATIVE NEGATIVE Final    Comment: (NOTE) The Xpert Xpress SARS-CoV-2/FLU/RSV plus assay is intended as an aid in the diagnosis of influenza from Nasopharyngeal swab specimens and should not be used as a sole basis for treatment. Nasal washings and aspirates are unacceptable for Xpert Xpress SARS-CoV-2/FLU/RSV testing.  Fact Sheet for Patients: EntrepreneurPulse.com.au  Fact Sheet for Healthcare Providers: IncredibleEmployment.be  This test is not yet approved or cleared by the  [place] FDA and has been authorized for detection and/or diagnosis of SARS-CoV-2 by FDA under an Emergency Use Authorization (EUA). This EUA will remain in effect (meaning this test can be used) for the  duration of the COVID-19 declaration under Section 564(b)(1) of the Act, 21 U.S.C. section 360bbb-3(b)(1), unless the authorization is terminated or revoked.  Performed at [facility], [place] [address] [place], [place] [address]   Culture, Urine     Status: Abnormal   Collection Time: 06/10/20  3:18 PM   Specimen: Urine, Random  Result Value Ref Range Status   Specimen Description URINE, RANDOM  Final   Special Requests   Final    NONE Performed at [facility], [place] [address], [place], [place] [address]    Culture >=100,000 COLONIES/mL ESCHERICHIA COLI (A)  Final   Report Status 06/13/2020 FINAL  Final   Organism ID, Bacteria ESCHERICHIA COLI (A)  Final      Susceptibility   Escherichia coli - MIC*    AMPICILLIN <=2 SENSITIVE Sensitive     CEFAZOLIN <=4 SENSITIVE Sensitive     CEFEPIME <=0.12 SENSITIVE Sensitive     CEFTRIAXONE <=0.25 SENSITIVE Sensitive     CIPROFLOXACIN <=0.25 SENSITIVE Sensitive     GENTAMICIN <=1 SENSITIVE Sensitive     IMIPENEM <=0.25 SENSITIVE Sensitive     NITROFURANTOIN <=16 SENSITIVE Sensitive     TRIMETH/SULFA <=20 SENSITIVE Sensitive     AMPICILLIN/SULBACTAM <=2 SENSITIVE Sensitive     PIP/TAZO <=4 SENSITIVE Sensitive     * >=100,000 COLONIES/mL ESCHERICHIA COLI  Surgical PCR screen     Status: None   Collection Time: 06/11/20  3:11 PM   Specimen: Nasal Mucosa; Nasal Swab  Result Value Ref Range Status   MRSA, PCR NEGATIVE NEGATIVE Final   Staphylococcus aureus NEGATIVE NEGATIVE Final    Comment: (NOTE) The Xpert SA Assay (FDA approved for NASAL specimens in patients 32 years of age and older), is one component of a comprehensive surveillance program. It is not intended to diagnose infection nor to guide or monitor  treatment. Performed at [facility], [place] [address], [place], [place] [address]   Resp Panel by RT-PCR (Flu A&B, Covid) Nasopharyngeal Swab     Status: None   Collection Time: 06/18/20 12:16 PM   Specimen: Nasopharyngeal Swab; Nasopharyngeal(NP) swabs in vial transport medium  Result Value Ref Range Status   SARS Coronavirus 2 by RT PCR NEGATIVE NEGATIVE Final    Comment: (NOTE) SARS-CoV-2 target nucleic acids are NOT DETECTED.  The SARS-CoV-2 RNA is generally detectable in upper respiratory specimens during the acute phase of infection. The lowest concentration of SARS-CoV-2 viral copies this assay can detect is 138 copies/mL. A negative result does not preclude SARS-Cov-2 infection and should not be used as the sole basis for treatment or other patient management decisions. A negative result may occur with  improper specimen collection/handling, submission of specimen other than nasopharyngeal swab, presence of viral mutation(s) within the areas targeted by this assay, and inadequate number of viral copies(<138 copies/mL). A negative result must be combined with clinical observations, patient history, and epidemiological information. The expected result is Negative.  Fact Sheet for Patients:  EntrepreneurPulse.com.au  Fact Sheet for Healthcare Providers:  IncredibleEmployment.be  This test is no t yet approved or cleared by the [place] FDA and  has been authorized for detection and/or diagnosis of SARS-CoV-2 by FDA under an Emergency Use Authorization (EUA). This EUA will remain  in effect (meaning this test can be used) for the duration of the COVID-19 declaration under Section 564(b)(1) of the Act, 21 U.S.C.section 360bbb-3(b)(1), unless the authorization is terminated  or revoked sooner.  Influenza A by PCR NEGATIVE NEGATIVE Final   Influenza B by PCR NEGATIVE NEGATIVE Final    Comment: (NOTE) The Xpert Xpress  SARS-CoV-2/FLU/RSV plus assay is intended as an aid in the diagnosis of influenza from Nasopharyngeal swab specimens and should not be used as a sole basis for treatment. Nasal washings and aspirates are unacceptable for Xpert Xpress SARS-CoV-2/FLU/RSV testing.  Fact Sheet for Patients: EntrepreneurPulse.com.au  Fact Sheet for Healthcare Providers: IncredibleEmployment.be  This test is not yet approved or cleared by the [place] FDA and has been authorized for detection and/or diagnosis of SARS-CoV-2 by FDA under an Emergency Use Authorization (EUA). This EUA will remain in effect (meaning this test can be used) for the duration of the COVID-19 declaration under Section 564(b)(1) of the Act, 21 U.S.C. section 360bbb-3(b)(1), unless the authorization is terminated or revoked.  Performed at [facility], [place] [address] [place], [place] [address]      Labs: BNP (last 3 results) No results for input(s): BNP in the last 8760 hours. Basic Metabolic Panel: No results for input(s): NA, K, CL, CO2, GLUCOSE, BUN, CREATININE, CALCIUM, MG, PHOS in the last 168 hours. Liver Function Tests: No results for input(s): AST, ALT, ALKPHOS, BILITOT, PROT, ALBUMIN in the last 168 hours. No results for input(s): LIPASE, AMYLASE in the last 168 hours. No results for input(s): AMMONIA in the last 168 hours. CBC: Recent Labs  Lab 06/15/20 0554 06/16/20 0238 06/17/20 0336 06/18/20 0335 06/19/20 0256  WBC 14.1* 12.3* 12.2* 12.5* 11.9*  HGB 12.9 12.4 13.0 13.2 12.8  HCT 37.1 36.2 39.9 38.7 37.4  MCV 90.7 91.9 92.8 91.3 91.2  PLT 229 236 228 270 267   Cardiac Enzymes: No results for input(s): CKTOTAL, CKMB, CKMBINDEX, TROPONINI in the last 168 hours. BNP: Invalid input(s): POCBNP CBG: Recent Labs  Lab 06/18/20 0626 06/18/20 1148 06/18/20 1725 06/18/20 2021 06/19/20 0634  GLUCAP 155* 188* 232* 201* 141*   D-Dimer No results for input(s):  DDIMER in the last 72 hours. Hgb A1c No results for input(s): HGBA1C in the last 72 hours. Lipid Profile No results for input(s): CHOL, HDL, LDLCALC, TRIG, CHOLHDL, LDLDIRECT in the last 72 hours. Thyroid function studies No results for input(s): TSH, T4TOTAL, T3FREE, THYROIDAB in the last 72 hours.  Invalid input(s): FREET3 Anemia work up No results for input(s): VITAMINB12, FOLATE, FERRITIN, TIBC, IRON, RETICCTPCT in the last 72 hours. Urinalysis    Component Value Date/Time   COLORURINE YELLOW 06/10/2020 1518   APPEARANCEUR HAZY (A) 06/10/2020 1518   LABSPEC 1.017 06/10/2020 1518   PHURINE 5.0 06/10/2020 1518   GLUCOSEU NEGATIVE 06/10/2020 1518   GLUCOSEU NEGATIVE 11/12/2007 0945   HGBUR SMALL (A) 06/10/2020 1518   BILIRUBINUR NEGATIVE 06/10/2020 1518   BILIRUBINUR negative 02/12/2019 1056   KETONESUR NEGATIVE 06/10/2020 1518   PROTEINUR NEGATIVE 06/10/2020 1518   UROBILINOGEN 0.2 02/12/2019 1056   UROBILINOGEN 0.2 mg/dL 11/12/2007 0945   NITRITE POSITIVE (A) 06/10/2020 1518   LEUKOCYTESUR MODERATE (A) 06/10/2020 1518   Sepsis Labs Invalid input(s): PROCALCITONIN,  WBC,  LACTICIDVEN Microbiology Recent Results (from the past 240 hour(s))  Resp Panel by RT-PCR (Flu A&B, Covid) Nasopharyngeal Swab     Status: None   Collection Time: 06/10/20  3:18 PM   Specimen: Nasopharyngeal Swab; Nasopharyngeal(NP) swabs in vial transport medium  Result Value Ref Range Status   SARS Coronavirus 2 by RT PCR NEGATIVE NEGATIVE Final    Comment: (NOTE) SARS-CoV-2 target nucleic acids are NOT DETECTED.  The SARS-CoV-2 RNA is generally detectable in upper respiratory specimens during the acute phase of infection. The lowest concentration of SARS-CoV-2 viral copies this assay can detect is 138 copies/mL. A negative result does not preclude SARS-Cov-2 infection and should not be used as the sole basis for treatment or other patient management decisions. A negative result may occur with   improper specimen collection/handling, submission of specimen other than nasopharyngeal swab, presence of viral mutation(s) within the areas targeted by this assay, and inadequate number of viral copies(<138 copies/mL). A negative result must be combined with clinical observations, patient history, and epidemiological information. The expected result is Negative.  Fact Sheet for Patients:  EntrepreneurPulse.com.au  Fact Sheet for Healthcare Providers:  IncredibleEmployment.be  This test is no t yet approved or cleared by the [place] FDA and  has been authorized for detection and/or diagnosis of SARS-CoV-2 by FDA under an Emergency Use Authorization (EUA). This EUA will remain  in effect (meaning this test can be used) for the duration of the COVID-19 declaration under Section 564(b)(1) of the Act, 21 U.S.C.section 360bbb-3(b)(1), unless the authorization is terminated  or revoked sooner.       Influenza A by PCR NEGATIVE NEGATIVE Final   Influenza B by PCR NEGATIVE NEGATIVE Final    Comment: (NOTE) The Xpert Xpress SARS-CoV-2/FLU/RSV plus assay is intended as an aid in the diagnosis of influenza from Nasopharyngeal swab specimens and should not be used as a sole basis for treatment. Nasal washings and aspirates are unacceptable for Xpert Xpress SARS-CoV-2/FLU/RSV testing.  Fact Sheet for Patients: EntrepreneurPulse.com.au  Fact Sheet for Healthcare Providers: IncredibleEmployment.be  This test is not yet approved or cleared by the [place] FDA and has been authorized for detection and/or diagnosis of SARS-CoV-2 by FDA under an Emergency Use Authorization (EUA). This EUA will remain in effect (meaning this test can be used) for the duration of the COVID-19 declaration under Section 564(b)(1) of the Act, 21 U.S.C. section 360bbb-3(b)(1), unless the authorization is terminated  or revoked.  Performed at [facility], [place] [address] [place], [place] [address]   Culture, Urine     Status: Abnormal   Collection Time: 06/10/20  3:18 PM   Specimen: Urine, Random  Result Value Ref Range Status   Specimen Description URINE, RANDOM  Final   Special Requests   Final    NONE Performed at [facility], [place] [address] [place], [place] [address]    Culture >=100,000 COLONIES/mL ESCHERICHIA COLI (A)  Final   Report Status 06/13/2020 FINAL  Final   Organism ID, Bacteria ESCHERICHIA COLI (A)  Final      Susceptibility   Escherichia coli - MIC*    AMPICILLIN <=2 SENSITIVE Sensitive     CEFAZOLIN <=4 SENSITIVE Sensitive     CEFEPIME <=0.12 SENSITIVE Sensitive     CEFTRIAXONE <=0.25 SENSITIVE Sensitive     CIPROFLOXACIN <=0.25 SENSITIVE Sensitive     GENTAMICIN <=1 SENSITIVE Sensitive     IMIPENEM <=0.25 SENSITIVE Sensitive     NITROFURANTOIN <=16 SENSITIVE Sensitive     TRIMETH/SULFA <=20 SENSITIVE Sensitive     AMPICILLIN/SULBACTAM <=2 SENSITIVE Sensitive     PIP/TAZO <=4 SENSITIVE Sensitive     * >=100,000 COLONIES/mL ESCHERICHIA COLI  Surgical PCR screen     Status: None   Collection Time: 06/11/20  3:11 PM   Specimen: Nasal Mucosa; Nasal Swab  Result Value Ref Range Status   MRSA, PCR NEGATIVE NEGATIVE Final   Staphylococcus  aureus NEGATIVE NEGATIVE Final    Comment: (NOTE) The Xpert SA Assay (FDA approved for NASAL specimens in patients 61 years of age and older), is one component of a comprehensive surveillance program. It is not intended to diagnose infection nor to guide or monitor treatment. Performed at [facility], [place] [address] [place], [place] [address]   Resp Panel by RT-PCR (Flu A&B, Covid) Nasopharyngeal Swab     Status: None   Collection Time: 06/18/20 12:16 PM   Specimen: Nasopharyngeal Swab; Nasopharyngeal(NP) swabs in vial transport medium  Result Value Ref Range Status   SARS Coronavirus 2 by RT PCR NEGATIVE  NEGATIVE Final    Comment: (NOTE) SARS-CoV-2 target nucleic acids are NOT DETECTED.  The SARS-CoV-2 RNA is generally detectable in upper respiratory specimens during the acute phase of infection. The lowest concentration of SARS-CoV-2 viral copies this assay can detect is 138 copies/mL. A negative result does not preclude SARS-Cov-2 infection and should not be used as the sole basis for treatment or other patient management decisions. A negative result may occur with  improper specimen collection/handling, submission of specimen other than nasopharyngeal swab, presence of viral mutation(s) within the areas targeted by this assay, and inadequate number of viral copies(<138 copies/mL). A negative result must be combined with clinical observations, patient history, and epidemiological information. The expected result is Negative.  Fact Sheet for Patients:  EntrepreneurPulse.com.au  Fact Sheet for Healthcare Providers:  IncredibleEmployment.be  This test is no t yet approved or cleared by the [place] FDA and  has been authorized for detection and/or diagnosis of SARS-CoV-2 by FDA under an Emergency Use Authorization (EUA). This EUA will remain  in effect (meaning this test can be used) for the duration of the COVID-19 declaration under Section 564(b)(1) of the Act, 21 U.S.C.section 360bbb-3(b)(1), unless the authorization is terminated  or revoked sooner.       Influenza A by PCR NEGATIVE NEGATIVE Final   Influenza B by PCR NEGATIVE NEGATIVE Final    Comment: (NOTE) The Xpert Xpress SARS-CoV-2/FLU/RSV plus assay is intended as an aid in the diagnosis of influenza from Nasopharyngeal swab specimens and should not be used as a sole basis for treatment. Nasal washings and aspirates are unacceptable for Xpert Xpress SARS-CoV-2/FLU/RSV testing.  Fact Sheet for Patients: EntrepreneurPulse.com.au  Fact Sheet for Healthcare  Providers: IncredibleEmployment.be  This test is not yet approved or cleared by the [place] FDA and has been authorized for detection and/or diagnosis of SARS-CoV-2 by FDA under an Emergency Use Authorization (EUA). This EUA will remain in effect (meaning this test can be used) for the duration of the COVID-19 declaration under Section 564(b)(1) of the Act, 21 U.S.C. section 360bbb-3(b)(1), unless the authorization is terminated or revoked.  Performed at [facility], [place] [address], [place], [place] [address]      Time coordinating discharge:  I have spent 35 minutes face to face with the patient and on the ward discussing the patients care, assessment, plan and disposition with other care givers. >50% of the time was devoted counseling the patient about the risks and benefits of treatment/Discharge disposition and coordinating care.   SIGNED:   Damita Lack, MD  [facility] 06/19/2020, 8:47 AM   If 7PM-7AM, please contact night-coverage

## 2020-06-17 ENCOUNTER — Telehealth: Payer: Self-pay | Admitting: Cardiology

## 2020-06-17 ENCOUNTER — Encounter: Payer: Self-pay | Admitting: *Deleted

## 2020-06-17 LAB — CBC
HCT: 39.9 % (ref 36.0–46.0)
Hemoglobin: 13 g/dL (ref 12.0–15.0)
MCH: 30.2 pg (ref 26.0–34.0)
MCHC: 32.6 g/dL (ref 30.0–36.0)
MCV: 92.8 fL (ref 80.0–100.0)
Platelets: 228 10*3/uL (ref 150–400)
RBC: 4.3 MIL/uL (ref 3.87–5.11)
RDW: 12.2 % (ref 11.5–15.5)
WBC: 12.2 10*3/uL — ABNORMAL HIGH (ref 4.0–10.5)
nRBC: 0 % (ref 0.0–0.2)

## 2020-06-17 LAB — GLUCOSE, CAPILLARY
Glucose-Capillary: 143 mg/dL — ABNORMAL HIGH (ref 70–99)
Glucose-Capillary: 176 mg/dL — ABNORMAL HIGH (ref 70–99)
Glucose-Capillary: 154 mg/dL — ABNORMAL HIGH (ref 70–99)
Glucose-Capillary: 217 mg/dL — ABNORMAL HIGH (ref 70–99)

## 2020-06-17 LAB — PROTIME-INR
INR: 1.2 (ref 0.8–1.2)
Prothrombin Time: 15 seconds (ref 11.4–15.2)

## 2020-06-17 MED ORDER — METOPROLOL TARTRATE 5 MG/5ML IV SOLN
5.0000 mg | INTRAVENOUS | Status: DC | PRN
  Administered 2020-06-17: 5 mg via INTRAVENOUS

## 2020-06-17 MED ORDER — WARFARIN SODIUM 4 MG PO TABS
4.0000 mg | ORAL_TABLET | Freq: Once | ORAL | Status: AC
  Administered 2020-06-17: 4 mg via ORAL
  Filled 2020-06-17: qty 1

## 2020-06-17 NOTE — Telephone Encounter (Signed)
  Patient is currently still admitted to hospital  Patient scheduled for [facility] appt on 07/06/20 @ 8:45 am with Richardson Dopp per Fabian Sharp

## 2020-06-17 NOTE — Progress Notes (Signed)
Patient seen and examined at bedside, patient's son is also at bedside. No complaints this morning.  Yesterday there was concerns about her mobility and physical therapy work with her again therefore she ended up staying in the hospital. Vital signs remained stable.  PT and TOC working on disposition.  Had prolonged discussion with and her son at bedside regarding her posthospitalization care.  All the questions answered by me.  Time spent-15 minutes  Gerlean Ren MD [facility]

## 2020-06-17 NOTE — Progress Notes (Signed)
CCMD called nurse to notify of patient HR fluctuating from 130s-140's, afib rhythm suspected. EKG ordered and resulted as afib rhythm, rapid response called, and MD notified of patient status. Vitals collected as . Patient asymptomatic, alert and oriented x4, no distress noted, breathing even and unlabored, no SOB noted. Lopressor 5mg  IVP ordered, administered. MD to be notified in 44mins of vitals. MEWS protocol initiated.

## 2020-06-17 NOTE — Telephone Encounter (Signed)
Per progress notes pt still admitted this am.

## 2020-06-17 NOTE — Progress Notes (Signed)
   06/17/20 1440  Vitals  Temp 98.2 F (36.8 C)  Temp Source Oral  BP (!) 145/83  MAP (mmHg) 99  BP Location Right Arm  BP Method Automatic  ECG Heart Rate (!) 145  Resp (!) 23  Level of Consciousness  Level of Consciousness Alert  MEWS COLOR  MEWS Score Color Red  Oxygen Therapy  SpO2 96 %  O2 Device Room Air  Pain Assessment  Pain Scale 0-10  Pain Score 0  PCA/Epidural/Spinal Assessment  Respiratory Pattern Regular  ECG Monitoring  Cardiac Rhythm Atrial fibrillation  New onset of dysrhythmia? Yes  MEWS Score  MEWS Temp 0  MEWS Systolic 0  MEWS Pulse 3  MEWS RR 1  MEWS LOC 0  MEWS Score 4  Provider Notification  Provider Name/Title Dr. Gerlean Ren  Date Provider Notified 06/17/20  Time Provider Notified 1445  Notification Type Page  Notification Reason Change in status  Response See new orders  Date of Provider Response 06/17/20  Time of Provider Response 1448  Rapid Response Notification  Name of Rapid Response RN Notified Kelli Churn, RN  Date Rapid Response Notified 06/17/20  Time Rapid Response Notified 1453  Patient flagged as [place] status at 1440 d/t elevated HR 130's-140's, and afib rhythm. Patient was asymptomatic, w/ no SOB/CP. RRT called, and MD notified of patient status. New order for lopressor 5mg  IVP given, pt HR still 120's-130s, MD notified again, another 5mg  lopressor IVP given. Pt HR 110's-120s after, metoprolol 12.5mg  PO given per order. MD notified that HR was still in 130s-140s after 1hr. New order for lopressor 5mg  IVP q2hrs PRN. After 1 hr pt HR 70, and reverted back to SR on tele. MD notified, no further action needed.

## 2020-06-17 NOTE — Progress Notes (Addendum)
Physical Therapy Treatment Patient Details Name: Cynthia Mathis MRN: [id number] DOB: March 26, 1933 Today's Date: 06/17/2020    History of Present Illness 86 yo admitted after syncope at home with resultant Rt ankle fx s/p ORIF, pt with bradycardia on admission. PMHx: HTN, Afib, DM, osteoporosis, breast CA, anxiety, macular degeneration    PT Comments    Pt supine on arrival, son present in room and encouraging, pt agreeable to therapy session. Pt performed slide board transfer from bed<>wheelchair with min guard and cues for technique, with improved weightbearing compliance using this transfer technique. Pt also performed stand pivot transfers from WC>toilet and toilet>wheelchair with +79modA, needing cues throughout for compliance with NWB RLE. Pt performed wheelchair mobility in room/hallway with minA to Supervision and cues for improved technique for BUE strengthening. Pt will continue to benefit from skilled rehab in a low to moderate intensity post-acute setting to maximize functional gains before returning home as she will have limited physical assist at home and 10 steps to enter. D/C recs updated per discussion with supervising PT Ryan L and pt/son.   Follow Up Recommendations  SNF;Supervision for mobility/OOB     Equipment Recommendations  Rolling walker with 5" wheels;Wheelchair (measurements PT);Wheelchair cushion (measurements PT)    Recommendations for Other Services       Precautions / Restrictions Precautions Precautions: Fall Restrictions Weight Bearing Restrictions: Yes RLE Weight Bearing: Non weight bearing    Mobility  Bed Mobility Overal bed mobility: Needs Assistance Bed Mobility: Supine to Sit     Supine to sit: Supervision;HOB elevated Sit to supine: Supervision   General bed mobility comments: Supervision with increased time/effort to sit EOB. heavy use of bedrail  Transfers Overall transfer level: Needs assistance Equipment used: Rolling walker (2  wheeled);1 person hand held assist;Sliding board Transfers: Sit to/from [facility];Lateral/Scoot Transfers Sit to Stand: Min assist Stand pivot transfers: Mod assist      Lateral/Scoot Transfers: Min guard;With slide board General transfer comment: pt often attempts to place R heel on floor during pivot transfer and needs vc/tcs for compliance for NWB RLE restriction; with slide board transfer, pt min guard for safety and cues for technique but improved compliance with R NWB restriction  Ambulation/Gait                 Theme park manager mobility: Yes Wheelchair propulsion: Both upper extremities Wheelchair parts: Needs assistance Distance: 80 Wheelchair Assistance Details (indicate cue type and reason): verbal/tactile cues for wheelchair parts/safety, pt needs reminder to lock chair prior to transfers and assist with leg rests; pt propelled chair forward/backward and performed turns 180 deg with min cues and good carryover of instruction  Modified Rankin (Stroke Patients Only)       Balance Overall balance assessment: Needs assistance Sitting-balance support: No upper extremity supported;Feet supported Sitting balance-Leahy Scale: Fair     Standing balance support: Bilateral upper extremity supported Standing balance-Leahy Scale: Poor Standing balance comment: minA, constant cues needed to maintain WB status                            Cognition Arousal/Alertness: Awake/alert Behavior During Therapy: WFL for tasks assessed/performed Overall Cognitive Status: Within Functional Limits for tasks assessed Area of Impairment: Memory;Safety/judgement                     Memory: Decreased recall of precautions;Decreased  short-term memory   Safety/Judgement: Decreased awareness of deficits Awareness: Emergent   General Comments: pt requires frequent cues for WB status, often  putting some weight through RLE despite PT cues to maintain foot off floor      Exercises General Exercises - Lower Extremity Long Arc Quad: AROM;Strengthening;Both;Seated    General Comments General comments (skin integrity, edema, etc.): VSS on RA 100% SpO2 and HR 90-105 bpm with mobility      Pertinent Vitals/Pain Pain Assessment: 0-10 Pain Score: 4  Pain Location: R ankle Pain Descriptors / Indicators: Grimacing Pain Intervention(s): Monitored during session   Vitals:   06/17/20 1440  BP: (!) 145/83  Pulse:   Resp: (!) 23  Temp:   SpO2: 96%    Home Living                      Prior Function            PT Goals (current goals can now be found in the care plan section) Acute Rehab PT Goals Patient Stated Goal: improve mobility PT Goal Formulation: With patient/family Time For Goal Achievement: 06/29/20 Potential to Achieve Goals: Fair Progress towards PT goals: Progressing toward goals (needs reinforcement for WB status)    Frequency    Min 3X/week      PT Plan Discharge plan needs to be updated;Frequency needs to be updated    Co-evaluation              AM-PAC PT "6 Clicks" Mobility   Outcome Measure  Help needed turning from your back to your side while in a flat bed without using bedrails?: None Help needed moving from lying on your back to sitting on the side of a flat bed without using bedrails?: A Little Help needed moving to and from a bed to a chair (including a wheelchair)?: A Lot Help needed standing up from a chair using your arms (e.g., wheelchair or bedside chair)?: A Lot Help needed to walk in hospital room?: A Lot Help needed climbing 3-5 steps with a railing? : Total 6 Click Score: 14    End of Session Equipment Utilized During Treatment: Gait belt Activity Tolerance: Patient tolerated treatment well Patient left: in bed;with call bell/phone within reach;with bed alarm set;with family/visitor present Nurse  Communication: Mobility status;Other (comment) (Stedy to bathroom vs slide board to drop arm BSC ([place] to order [facility])) PT Visit Diagnosis: Other abnormalities of gait and mobility (R26.89);Difficulty in walking, not elsewhere classified (R26.2);Muscle weakness (generalized) (M62.81);Pain Pain - Right/Left: Right Pain - part of body: Ankle and joints of foot     Time: 6644-0347 PT Time Calculation (min) (ACUTE ONLY): 35 min  Charges:  $Therapeutic Exercise: 8-22 mins $Therapeutic Activity: 8-22 mins                     Shequilla Goodgame P., PTA Acute Rehabilitation Services Pager: [phone] Office: [place] 06/17/2020, 2:42 PM

## 2020-06-17 NOTE — Progress Notes (Signed)
Inpatient Rehab Admissions Coordinator:   I met with this patient and son to discuss referral for CIR and potential need for rehab following discharge. Pt.'s dx of ankle fx does not medical necessity requirements for CIR; however, pt. May benefit from STR at SNF if family does not feel they are able to provide adequate assistance at home. Pt.'s son stated he would like to see how Pt. Does with PT today before deciding whether or not she needs STR. Pt.'s son states that house is accessible and his wife can provide some physical assistance at d/c.   , MS, CCC-SLP Rehab Admissions Coordinator  [phone] (celll) [phone] (office)  

## 2020-06-17 NOTE — NC FL2 (Signed)
[place] MEDICAID FL2 LEVEL OF CARE SCREENING TOOL     IDENTIFICATION  Patient Name: Cynthia Mathis Birthdate: 1933-01-11 Sex: female Admission Date (Current Location): 06/10/2020  [facility] [place] Number:  Herbalist and Address:  [facility], [place] [address] [place], [place] [address]      Provider Number: [id number]  Attending Physician Name and Address:  Damita Lack, MD  Relative Name and Phone Number:  Jennafer Gladue - [phone]    Current Level of Care: Hospital Recommended Level of Care: [place] Prior Approval Number:    Date Approved/Denied:   PASRR Number: [id number]  Discharge Plan: SNF    Current Diagnoses: Patient Active Problem List   Diagnosis Date Noted  . Closed displaced trimalleolar fracture of right ankle 06/10/2020  . Leukocytosis 06/10/2020  . Bradycardia 06/10/2020  . Syncope 06/10/2020  . Aortic atherosclerosis ([place]) 05/10/2020  . Secondary hypercoagulable state ([place]) 04/23/2020  . Posterior vitreous detachment of right eye 12/02/2019  . Exudative age-related macular degeneration of left eye with active choroidal neovascularization ([place]) 10/28/2019  . Exudative age-related macular degeneration of right eye with active choroidal neovascularization ([place]) 10/28/2019  . Retinal hemorrhage of left eye 10/28/2019  . Malignant neoplasm of upper-outer quadrant of right breast in female, estrogen receptor positive ([place]) 03/26/2019  . Mild cognitive impairment 03/28/2018  . Foot pain, right 12/05/2017  . Hx of colonic polyps   . Diverticulosis of colon   . Anxiety   . Paroxysmal atrial fibrillation ([place]) 08/24/2016  . Low back pain 05/06/2015  . Osteopenia 08/04/2013  . Memory loss 09/17/2012  . 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 Macular degeneration (senile) of retina 08/12/2008  . Osteoarthritis 08/12/2008  . Impaired fasting glucose 02/10/2008  . Hypothyroidism 08/08/2007   . Dyslipidemia 08/08/2007  . Essential hypertension 08/08/2007    Orientation RESPIRATION BLADDER Height & Weight     Self,Time,Situation,Place  Normal Continent Weight: 65.8 kg Height:  5\' 3"  (160 cm)  BEHAVIORAL SYMPTOMS/MOOD NEUROLOGICAL BOWEL NUTRITION STATUS      Continent Diet (See discharge summary)  AMBULATORY STATUS COMMUNICATION OF NEEDS Skin   Extensive Assist Verbally Surgical wounds (splint after ankle fracture repair)                       Personal Care Assistance Level of Assistance  Bathing,Feeding,Dressing Bathing Assistance: Limited assistance Feeding assistance: Independent Dressing Assistance: Limited assistance     Functional Limitations Info  Sight,Hearing,Speech Sight Info: Adequate Hearing Info: Adequate Speech Info: Adequate    SPECIAL CARE FACTORS FREQUENCY  PT (By licensed PT),OT (By licensed OT)     PT Frequency: 5 x per week OT Frequency: 5 x per week            Contractures Contractures Info: Not present    Additional Factors Info  Insulin Sliding Scale,Psychotropic,Code Status,Allergies (Fully vaccinated for COVID) Code Status Info: Full code Allergies Info: Epinephrine Psychotropic Info: namenda Insulin Sliding Scale Info: See discharge summary       Current Medications (06/17/2020):  This is the current hospital active medication list Current Facility-Administered Medications  Medication Dose Route Frequency Provider Last Rate Last Admin  . 0.9 %  sodium chloride infusion   Intravenous PRN [name], MD 10 mL/hr at 06/11/20 1701 250 mL at 06/11/20 1701  . anastrozole (ARIMIDEX) tablet 1 mg  1 mg Oral Daily [name], MD   1 mg at 06/17/20 4827  .  docusate sodium (COLACE) capsule 100 mg  100 mg Oral BID [name], MD   100 mg at 06/17/20 5027  . feeding supplement (GLUCERNA SHAKE) (GLUCERNA SHAKE) liquid 237 mL  237 mL Oral TID BM [name], MD   237 mL at 06/17/20 0829  . hydrochlorothiazide (HYDRODIURIL)  tablet 25 mg  25 mg Oral Daily [name], MD   25 mg at 06/17/20 7412  . HYDROcodone-acetaminophen (NORCO/VICODIN) 5-325 MG per tablet 1-2 tablet  1-2 tablet Oral Q6H PRN [name], MD   1 tablet at 06/17/20 1244  . insulin aspart (novoLOG) injection 0-9 Units  0-9 Units Subcutaneous TID WC [name], MD   2 Units at 06/17/20 1244  . losartan (COZAAR) tablet 100 mg  100 mg Oral Daily [name], MD   100 mg at 06/17/20 8786  . memantine (NAMENDA) tablet 10 mg  10 mg Oral BID [name], MD   10 mg at 06/17/20 7672  . metoprolol tartrate (LOPRESSOR) injection 5 mg  5 mg Intravenous Q4H PRN [name], MD   5 mg at 06/12/20 1227  . metoprolol tartrate (LOPRESSOR) tablet 12.5 mg  12.5 mg Oral BID [name], MD   12.5 mg at 06/17/20 0947  . morphine 2 MG/ML injection 0.5 mg  0.5 mg Intravenous Q2H PRN [name], MD      . multivitamin with minerals tablet 1 tablet  1 tablet Oral Daily [name], MD   1 tablet at 06/17/20 [phone]  . warfarin (COUMADIN) tablet 4 mg  4 mg Oral ONCE-1600 [place]      . Warfarin - Pharmacist Dosing Inpatient   Does not apply q1600 [facility]   Given at 06/16/20 1754     Discharge Medications: Please see discharge summary for a list of discharge medications.  Relevant Imaging Results:  Relevant Lab Results:   Additional Information SS# [id number]  Curlene Labrum, RN

## 2020-06-17 NOTE — Progress Notes (Addendum)
[place] for Warfarin Indication: atrial fibrillation  Allergies  Allergen Reactions  . Epinephrine Palpitations    heart racing    Patient Measurements: Height: 5\' 3"  (160 cm) Weight: 65.8 kg (145 lb) IBW/kg (Calculated) : 52.4 Heparin Dosing Weight: 66.4kg  Vital Signs: Temp: 97.9 F (36.6 C) (12/09 0300) Temp Source: Axillary (12/09 0300) BP: 133/66 (12/09 0300) Pulse Rate: 61 (12/09 0300)  Labs: Recent Labs    06/15/20 0554 06/16/20 0238 06/17/20 0336  HGB 12.9 12.4 13.0  HCT 37.1 36.2 39.9  PLT 229 236 228  LABPROT 14.7 14.6 15.0  INR 1.2 1.2 1.2    Estimated Creatinine Clearance: 39.9 mL/min (by C-G formula based on SCr of 0.94 mg/dL).   Medical History: Past Medical History:  Diagnosis Date  . Anxiety   . Borderline diabetes mellitus   . Cancer ([place]) 2020   breast  . Diverticulosis of colon   . DJD (degenerative joint disease)   . Dyslipidemia   . History of hematuria   . History of osteoporosis   . Hx of colonic polyps   . Hypertension   . Hypothyroid   . Macular degeneration    sees [name] for general eye care, sees [name] for retinal injections   . Memory loss   . Osteoporosis    took bisphosphonates, now off. Last DEXA in 2015 showed osteopenia   . Paroxysmal atrial fibrillation (HCC)    sees [name]   . SCCA (squamous cell carcinoma) of skin 12/11/2019   Left Hand Posterior (in situ)    Assessment: Patient is a 86 yo female admitted after syncopal episode. Patient was found to have ankle fracture s/p OR on 12/6 for repair. Patient takes warfarin prior to admission for Afib which has been held while inpatient. Admit INR 2.6 therapeutic. Pharmacy consulted to resume warfarin. S/p vitamin K 5mg  po on 12/3.   INR 1.2 remains subtherapeutic as expected, just restarted warfarin 2 days ago and patient got PO vitamin K. Hgb stable, plt wnl. No bridge needed for afib.  Prior to  admission patient was on warfarin 2.5mg  daily.    Goal of Therapy:  INR 2-3 Monitor platelets by anticoagulation protocol: Yes   Plan:  Warfarin 4 mg x1 tonight Monitor INR, CBC and S/S of bleeding daily  For discharge, resume home warfarin dose 2.5mg  Daily   Benetta Spar, PharmD, BCPS, BCCP Clinical Pharmacist  Please check AMION for all [place] phone numbers After 10:00 PM, call [place] [phone]

## 2020-06-18 DIAGNOSIS — S82851G Displaced trimalleolar fracture of right lower leg, subsequent encounter for closed fracture with delayed healing: Secondary | ICD-10-CM

## 2020-06-18 LAB — CBC
HCT: 38.7 % (ref 36.0–46.0)
Hemoglobin: 13.2 g/dL (ref 12.0–15.0)
MCH: 31.1 pg (ref 26.0–34.0)
MCHC: 34.1 g/dL (ref 30.0–36.0)
MCV: 91.3 fL (ref 80.0–100.0)
Platelets: 270 10*3/uL (ref 150–400)
RBC: 4.24 MIL/uL (ref 3.87–5.11)
RDW: 12.2 % (ref 11.5–15.5)
WBC: 12.5 10*3/uL — ABNORMAL HIGH (ref 4.0–10.5)
nRBC: 0 % (ref 0.0–0.2)

## 2020-06-18 LAB — GLUCOSE, CAPILLARY
Glucose-Capillary: 155 mg/dL — ABNORMAL HIGH (ref 70–99)
Glucose-Capillary: 188 mg/dL — ABNORMAL HIGH (ref 70–99)
Glucose-Capillary: 232 mg/dL — ABNORMAL HIGH (ref 70–99)
Glucose-Capillary: 201 mg/dL — ABNORMAL HIGH (ref 70–99)

## 2020-06-18 LAB — PROTIME-INR
INR: 1.3 — ABNORMAL HIGH (ref 0.8–1.2)
Prothrombin Time: 15.9 seconds — ABNORMAL HIGH (ref 11.4–15.2)

## 2020-06-18 LAB — RESP PANEL BY RT-PCR (FLU A&B, COVID) ARPGX2
Influenza A by PCR: NEGATIVE
Influenza B by PCR: NEGATIVE
SARS Coronavirus 2 by RT PCR: NEGATIVE

## 2020-06-18 MED ORDER — HYDROCODONE-ACETAMINOPHEN 5-325 MG PO TABS: 1.0000 | ORAL_TABLET | Freq: Four times a day (QID) | ORAL | 0 refills | Status: DC | PRN

## 2020-06-18 MED ORDER — DOCUSATE SODIUM 100 MG PO CAPS: 100.0000 mg | ORAL_CAPSULE | Freq: Two times a day (BID) | ORAL | 0 refills | Status: DC | PRN

## 2020-06-18 MED ORDER — WARFARIN SODIUM 4 MG PO TABS
4.0000 mg | ORAL_TABLET | Freq: Once | ORAL | Status: AC
  Administered 2020-06-18: 4 mg via ORAL
  Filled 2020-06-18: qty 1

## 2020-06-18 NOTE — Telephone Encounter (Signed)
Pt is still currently admitted to the hospital at this time.  Nursing to continue to monitor discharge status on this pt, and follow-up accordingly thereafter, for TOC call.

## 2020-06-18 NOTE — Progress Notes (Signed)
Seen and examined at bedside, no complaints this morning.  Very awaiting placement at the rehab.  Vital signs remained stable.  Discharge summary updated from 12/8.  Spoke with TOC team.  Call with further questions as needed.  Gerlean Ren MD [facility]

## 2020-06-18 NOTE — TOC Transition Note (Addendum)
Transition of Care [facility]) - CM/SW Discharge Note   Patient Details  Name: Cynthia Mathis MRN: [id number] Date of Birth: [birth year]  Transition of Care [facility]) CM/SW Contact:  Curlene Labrum, RN Phone Number: 06/18/2020, 11:48 AM   Clinical Narrative:    Case management spoke with the patient's son this morning and offered choice regarding SNf placement - and the son chose [facility].  The patient will need her COVID screen completed this morning.  I spoke with the patient and notified her of her transfer to the facility today.  I will place discharge summary in the hub for the facility and update the family as well.  The primary RN will be able to call report to [facility] at [phone] for Room 209.  I will continue to follow the patient for discharge and arranged transport to the facility via [facility] once her COVID screen is back.  PTAR was called and transportation was arranged for 3 pm today to the facility.  I spoke with the patient and daughter-in-law in the room and they are aware of patient's discharge today to the facility.  Final next level of care: [place] Barriers to Discharge: No Barriers Identified   Patient Goals and CMS Choice Patient states their goals for this hospitalization and ongoing recovery are:: Patient plans on discharging home to his son's home in [place], [place] [address]. CMS Medicare.gov Compare Post Acute Care list provided to:: Patient Choice offered to / list presented to : [place]  Discharge Placement                       Discharge Plan and Services   Discharge Planning Services: CM Consult Post Acute Care Choice: [place]          DME Arranged: 3-N-1,Wheelchair manual,Walker rolling,Tub bench DME Agency: [employer] Date DME Agency Contacted: 06/16/20 Time DME Agency Contacted: 0300 Representative spoke with at DME Agency: Sue Lush, [place] with [facility]  Arranged: PT,OT [place] Agency: [place] Date [place]: 06/16/20 Time [place]: 1344 Representative spoke with at [place]: Tommi Rumps, [facility] - waiting to hear back with confirmation  Social Determinants of Health (SDOH) Interventions     Readmission Risk Interventions Readmission Risk Prevention Plan 06/16/2020  Post Dischage Appt Complete  Medication Screening Complete  Transportation Screening Complete  Some recent data might be hidden

## 2020-06-18 NOTE — Plan of Care (Signed)
  Problem: Pain Managment: Goal: General experience of comfort will improve Outcome: Progressing   Problem: Safety: Goal: Ability to remain free from injury will improve Outcome: Progressing   

## 2020-06-18 NOTE — Progress Notes (Signed)
Called [facility] and report given to nurse Jenny Reichmann.

## 2020-06-18 NOTE — Progress Notes (Signed)
IP rehab admissions - noted plans for DC to [facility] in [place].  Will sign off for inpatient rehab since patient does not meet medical necessity requirements.  Call for questions.  [phone]

## 2020-06-18 NOTE — Progress Notes (Signed)
Cynthia Mathis for Warfarin Indication: atrial fibrillation  Allergies  Allergen Reactions  . Epinephrine Palpitations    heart racing    Patient Measurements: Height: 5\' 3"  (160 cm) Weight: 65.8 kg (145 lb) IBW/kg (Calculated) : 52.4 Heparin Dosing Weight: 66.4kg  Vital Signs: Temp: 98.3 F (36.8 C) (12/10 0718) Temp Source: Oral (12/10 0718) BP: 146/68 (12/10 0718) Pulse Rate: 62 (12/10 0718)  Labs: Recent Labs    06/16/20 0238 06/17/20 0336 06/18/20 0335  HGB 12.4 13.0 13.2  HCT 36.2 39.9 38.7  PLT 236 228 270  LABPROT 14.6 15.0 15.9*  INR 1.2 1.2 1.3*    Estimated Creatinine Clearance: 39.9 mL/min (by C-G formula based on SCr of 0.94 mg/dL).   Medical History: Past Medical History:  Diagnosis Date  . Anxiety   . Borderline diabetes mellitus   . Cancer ([place]) 2020   breast  . Diverticulosis of colon   . DJD (degenerative joint disease)   . Dyslipidemia   . History of hematuria   . History of osteoporosis   . Hx of colonic polyps   . Hypertension   . Hypothyroid   . Macular degeneration    sees [name] for general eye care, sees [name] for retinal injections   . Memory loss   . Osteoporosis    took bisphosphonates, now off. Last DEXA in 2015 showed osteopenia   . Paroxysmal atrial fibrillation (HCC)    sees [name]   . SCCA (squamous cell carcinoma) of skin 12/11/2019   Left Hand Posterior (in situ)    Assessment: 86 yo female admitted after syncopal episode. Patient was found to have ankle fracture s/p OR on 12/6 for repair. Patient takes warfarin prior to admission for Afib, which was held initially inpatient. Admit INR 2.6 - therapeutic. Pharmacy consulted to resume warfarin 12/7. Noted, s/p vitamin K 5mg  PO on 12/3.   INR 1.3 - remains subtherapeutic, as expected, with patient just restarted on warfarin 3 days ago and received PO vitamin K. CBC stable. No active bleed issues reported. No  bridge needed for afib.  Prior to admission, patient was on warfarin 2.5mg  daily.    Goal of Therapy:  INR 2-3 Monitor platelets by anticoagulation protocol: Yes   Plan:  Warfarin 4 mg PO x 1 again tonight Monitor daily INR, CBC, and s/sx of bleeding daily  On discharge, resume home warfarin dose 2.5mg  daily    Arturo Morton, PharmD, BCPS Please check AMION for all [place] contact numbers Clinical Pharmacist 06/18/2020 9:23 AM

## 2020-06-19 DIAGNOSIS — R001 Bradycardia, unspecified: Secondary | ICD-10-CM | POA: Diagnosis not present

## 2020-06-19 DIAGNOSIS — G309 Alzheimer's disease, unspecified: Secondary | ICD-10-CM | POA: Diagnosis not present

## 2020-06-19 DIAGNOSIS — S82851D Displaced trimalleolar fracture of right lower leg, subsequent encounter for closed fracture with routine healing: Secondary | ICD-10-CM | POA: Diagnosis not present

## 2020-06-19 DIAGNOSIS — I480 Paroxysmal atrial fibrillation: Secondary | ICD-10-CM | POA: Diagnosis not present

## 2020-06-19 DIAGNOSIS — R2681 Unsteadiness on feet: Secondary | ICD-10-CM | POA: Diagnosis not present

## 2020-06-19 DIAGNOSIS — D6869 Other thrombophilia: Secondary | ICD-10-CM | POA: Diagnosis not present

## 2020-06-19 DIAGNOSIS — E559 Vitamin D deficiency, unspecified: Secondary | ICD-10-CM | POA: Diagnosis not present

## 2020-06-19 DIAGNOSIS — Z4789 Encounter for other orthopedic aftercare: Secondary | ICD-10-CM | POA: Diagnosis not present

## 2020-06-19 DIAGNOSIS — Z7901 Long term (current) use of anticoagulants: Secondary | ICD-10-CM | POA: Diagnosis not present

## 2020-06-19 DIAGNOSIS — E538 Deficiency of other specified B group vitamins: Secondary | ICD-10-CM | POA: Diagnosis not present

## 2020-06-19 DIAGNOSIS — Z886 Allergy status to analgesic agent status: Secondary | ICD-10-CM | POA: Diagnosis not present

## 2020-06-19 DIAGNOSIS — I10 Essential (primary) hypertension: Secondary | ICD-10-CM | POA: Diagnosis not present

## 2020-06-19 DIAGNOSIS — M1990 Unspecified osteoarthritis, unspecified site: Secondary | ICD-10-CM | POA: Diagnosis not present

## 2020-06-19 DIAGNOSIS — Z87891 Personal history of nicotine dependence: Secondary | ICD-10-CM | POA: Diagnosis not present

## 2020-06-19 DIAGNOSIS — E46 Unspecified protein-calorie malnutrition: Secondary | ICD-10-CM | POA: Diagnosis not present

## 2020-06-19 DIAGNOSIS — M810 Age-related osteoporosis without current pathological fracture: Secondary | ICD-10-CM | POA: Diagnosis not present

## 2020-06-19 DIAGNOSIS — R6889 Other general symptoms and signs: Secondary | ICD-10-CM | POA: Diagnosis not present

## 2020-06-19 DIAGNOSIS — I499 Cardiac arrhythmia, unspecified: Secondary | ICD-10-CM | POA: Diagnosis not present

## 2020-06-19 DIAGNOSIS — Z8249 Family history of ischemic heart disease and other diseases of the circulatory system: Secondary | ICD-10-CM | POA: Diagnosis not present

## 2020-06-19 DIAGNOSIS — M2550 Pain in unspecified joint: Secondary | ICD-10-CM | POA: Diagnosis not present

## 2020-06-19 DIAGNOSIS — Z79899 Other long term (current) drug therapy: Secondary | ICD-10-CM | POA: Diagnosis not present

## 2020-06-19 DIAGNOSIS — M25571 Pain in right ankle and joints of right foot: Secondary | ICD-10-CM | POA: Diagnosis not present

## 2020-06-19 DIAGNOSIS — Z853 Personal history of malignant neoplasm of breast: Secondary | ICD-10-CM | POA: Diagnosis not present

## 2020-06-19 DIAGNOSIS — Z4889 Encounter for other specified surgical aftercare: Secondary | ICD-10-CM | POA: Diagnosis not present

## 2020-06-19 DIAGNOSIS — K5900 Constipation, unspecified: Secondary | ICD-10-CM | POA: Diagnosis not present

## 2020-06-19 DIAGNOSIS — Z743 Need for continuous supervision: Secondary | ICD-10-CM | POA: Diagnosis not present

## 2020-06-19 DIAGNOSIS — R55 Syncope and collapse: Secondary | ICD-10-CM | POA: Diagnosis not present

## 2020-06-19 LAB — CBC
HCT: 37.4 % (ref 36.0–46.0)
Hemoglobin: 12.8 g/dL (ref 12.0–15.0)
MCH: 31.2 pg (ref 26.0–34.0)
MCHC: 34.2 g/dL (ref 30.0–36.0)
MCV: 91.2 fL (ref 80.0–100.0)
Platelets: 267 10*3/uL (ref 150–400)
RBC: 4.1 MIL/uL (ref 3.87–5.11)
RDW: 12 % (ref 11.5–15.5)
WBC: 11.9 10*3/uL — ABNORMAL HIGH (ref 4.0–10.5)
nRBC: 0 % (ref 0.0–0.2)

## 2020-06-19 LAB — PROTIME-INR
INR: 1.3 — ABNORMAL HIGH (ref 0.8–1.2)
Prothrombin Time: 15.8 seconds — ABNORMAL HIGH (ref 11.4–15.2)

## 2020-06-19 LAB — GLUCOSE, CAPILLARY: Glucose-Capillary: 141 mg/dL — ABNORMAL HIGH (ref 70–99)

## 2020-06-19 MED ORDER — WARFARIN SODIUM 5 MG PO TABS: 5.0000 mg | ORAL_TABLET | Freq: Once | ORAL | Status: DC

## 2020-06-19 NOTE — Progress Notes (Signed)
[facility] here to pick up patient. I met with the patient and her son at bedside right before her transfer.  Vital signs were stable all the questions answered.  No complaints.  Discussed with patient's RN.  Call with further questions as needed.  Tamarius Rosenfield TRH

## 2020-06-19 NOTE — Progress Notes (Addendum)
Called [facility] for Pt's transport to SNF, called [facility] twice to give update but no answer. Pt is alert and oriented, not in distress, due meds given.  Gave report to nurse Tammy, son at bedside updated. Discharged to facility via PTAR with belongings.

## 2020-06-19 NOTE — Plan of Care (Signed)

## 2020-06-19 NOTE — Progress Notes (Signed)
[facility] contacted to receive ETA for transport to SNF. Charge nurse also contacted [facility] for ETA during the night with no response.

## 2020-06-19 NOTE — Progress Notes (Signed)
Cynthia Mathis for Warfarin Indication: atrial fibrillation  Allergies  Allergen Reactions  . Epinephrine Palpitations    heart racing    Patient Measurements: Height: 5\' 3"  (160 cm) Weight: 65.8 kg (145 lb) IBW/kg (Calculated) : 52.4 Heparin Dosing Weight: 66.4kg  Vital Signs: Temp: 97.8 F (36.6 C) (12/11 0721) Temp Source: Oral (12/11 0721) BP: 139/62 (12/11 0721) Pulse Rate: 66 (12/11 0813)  Labs: Recent Labs    06/17/20 0336 06/18/20 0335 06/19/20 0256  HGB 13.0 13.2 12.8  HCT 39.9 38.7 37.4  PLT 228 270 267  LABPROT 15.0 15.9* 15.8*  INR 1.2 1.3* 1.3*    Estimated Creatinine Clearance: 39.9 mL/min (by C-G formula based on SCr of 0.94 mg/dL).   Medical History: Past Medical History:  Diagnosis Date  . Anxiety   . Borderline diabetes mellitus   . Cancer ([place]) 2020   breast  . Diverticulosis of colon   . DJD (degenerative joint disease)   . Dyslipidemia   . History of hematuria   . History of osteoporosis   . Hx of colonic polyps   . Hypertension   . Hypothyroid   . Macular degeneration    sees [name] for general eye care, sees [name] for retinal injections   . Memory loss   . Osteoporosis    took bisphosphonates, now off. Last DEXA in 2015 showed osteopenia   . Paroxysmal atrial fibrillation (HCC)    sees [name]   . SCCA (squamous cell carcinoma) of skin 12/11/2019   Left Hand Posterior (in situ)    Assessment: 86 year old female admitted after syncopal episode. Patient was found to have ankle fracture s/p OR on 12/6 for repair.   Patient takes warfarin prior to admission for Afib, which was held initially inpatient. Admit INR 2.6 - therapeutic. Pharmacy consulted to resume warfarin 12/7. Noted, s/p vitamin K 5mg  PO on 12/3.   INR 1.3 - remains subtherapeutic 4 days post vitamin K. CBC stable. No active bleed issues reported. No bridge needed for afib.  Prior to admission, patient was on  warfarin 2.5mg  daily.    Goal of Therapy:  INR 2-3 Monitor platelets by anticoagulation protocol: Yes   Plan:  - Warfarin 5 mg PO x 1 tonight - Monitor daily INR, CBC, and s/sx of bleeding daily  - On discharge, resume home warfarin dose 2.5mg  daily      Chancey Ringel L. Devin Going, [place] PGY2 Pharmacy Resident Weekends 7:00 am - 3:00 pm, please call [phone] 06/19/20      8:28 AM  Please check AMION for all [place] phone numbers After 10:00 PM, call the [place] [phone]

## 2020-06-21 ENCOUNTER — Telehealth: Payer: Self-pay | Admitting: Pharmacist

## 2020-06-21 DIAGNOSIS — Z79899 Other long term (current) drug therapy: Secondary | ICD-10-CM | POA: Diagnosis not present

## 2020-06-21 NOTE — Chronic Care Management (AMB) (Signed)
Chronic Care Management Pharmacy Assistant   Name: Lareen Mullings  MRN: [id number] DOB: Apr 25, 1933  Reason for Encounter: Medication Review  PCP : [name], MD  Allergies:   Allergies  Allergen Reactions  . Epinephrine Palpitations    heart racing    Medications: Outpatient Encounter Medications as of 06/21/2020  Medication Sig  . anastrozole (ARIMIDEX) 1 MG tablet Take 1 tablet (1 mg total) by mouth daily.  . Calcium Carbonate-Vitamin D (CALCIUM 600+D) 600-400 MG-UNIT per tablet Take 1 tablet by mouth daily.  . Cyanocobalamin (VITAMIN B 12) 500 MCG TABS Take 1,000 mcg by mouth daily.   [name] diltiazem (CARDIZEM) 30 MG tablet Take 1 tablet every 4 hours AS NEEDED for afib heart rate >100 as long as top BP >100. (Patient taking differently: Take 30 mg by mouth every 4 (four) hours as needed (for afib heart rate >100 as long as top BP >100.). )  . docusate sodium (COLACE) 100 MG capsule Take 1 capsule (100 mg total) by mouth 2 (two) times daily as needed for mild constipation.  . fish oil-omega-3 fatty acids 1000 MG capsule Take 1 g by mouth daily.  [name] GLUCOSAMINE-CHONDROITIN PO Take 1 tablet by mouth 3 (three) times daily.   [name] HYDROcodone-acetaminophen (NORCO/VICODIN) 5-325 MG tablet Take 1 tablet by mouth every 6 (six) hours as needed for moderate pain.  [name] losartan-hydrochlorothiazide (HYZAAR) 100-25 MG tablet Take 1 tablet by mouth daily.  . memantine (NAMENDA) 10 MG tablet TAKE ONE TABLET BY MOUTH TWICE A DAY (Patient taking differently: Take 10 mg by mouth 2 (two) times daily. )  . metoprolol tartrate (LOPRESSOR) 25 MG tablet Take 0.5 tablets (12.5 mg total) by mouth 2 (two) times daily.  . Multiple Vitamins-Minerals (MULTIVITAMIN & MINERAL PO) Take 1 tablet by mouth daily.  [name] warfarin (COUMADIN) 5 MG tablet Take as directed by Coumadin Clinic (Patient taking differently: Take 2.5 mg by mouth daily. )   No facility-administered encounter medications on file as of  06/21/2020.    Current Diagnosis: Patient Active Problem List   Diagnosis Date Noted  . Closed displaced trimalleolar fracture of right ankle 06/10/2020  . Leukocytosis 06/10/2020  . Bradycardia 06/10/2020  . Syncope 06/10/2020  . Aortic atherosclerosis ([place]) 05/10/2020  . Secondary hypercoagulable state ([place]) 04/23/2020  . Posterior vitreous detachment of right eye 12/02/2019  . Exudative age-related macular degeneration of left eye with active choroidal neovascularization ([place]) 10/28/2019  . Exudative age-related macular degeneration of right eye with active choroidal neovascularization ([place]) 10/28/2019  . Retinal hemorrhage of left eye 10/28/2019  . Malignant neoplasm of upper-outer quadrant of right breast in female, estrogen receptor positive ([place]) 03/26/2019  . Mild cognitive impairment 03/28/2018  . Foot pain, right 12/05/2017  . Hx of colonic polyps   . Diverticulosis of colon   . Anxiety   . Paroxysmal atrial fibrillation ([place]) 08/24/2016  . Low back pain 05/06/2015  . Osteopenia 08/04/2013  . Memory loss 09/17/2012  . 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 Macular degeneration (senile) of retina 08/12/2008  . Osteoarthritis 08/12/2008  . Impaired fasting glucose 02/10/2008  . Hypothyroidism 08/08/2007  . Dyslipidemia 08/08/2007  . Essential hypertension 08/08/2007    Goals Addressed   None     Follow-Up:  Pharmacist Review  I called the patient for her monthly assessment. I was not able to do so. I did speak with her son. She has recently been admitted to the hospital and now is in rehab. He was not  sure of how long she was going to be there so he will give [place] a call when she gets out to continue delivery of her medications.  Maia Breslow, [place] Assistant [phone]

## 2020-06-21 NOTE — Telephone Encounter (Signed)
Transition Care Management Unsuccessful Follow-up Telephone Call  Date of discharge and from where:  06/19/20   Attempts:  1st Attempt  Reason for unsuccessful TCM follow-up call:  Left voice message   Pt D/C plan:   1. Follow up with PCP in 1-2 weeks 2. Please obtain BMP/CBC in one week your next doctors visit.  3. Check INR on Monday, 06/22/2019 4. Metoprolol reduced to 12.5 mg twice daily.  Resume home Coumadin and follow-up outpatient at A. fib clinic 5. Cardiology team to make arrangements for 30-day monitor (ordered) 6. Follow-up outpatient with orthopedic as recommended by their service 7. Follow-up outpatient thyroid studies in about 4 to 6 weeks

## 2020-06-22 ENCOUNTER — Ambulatory Visit (INDEPENDENT_AMBULATORY_CARE_PROVIDER_SITE_OTHER): Payer: Medicare Other

## 2020-06-22 ENCOUNTER — Encounter: Payer: Self-pay | Admitting: Cardiology

## 2020-06-22 DIAGNOSIS — R55 Syncope and collapse: Secondary | ICD-10-CM

## 2020-06-22 DIAGNOSIS — R001 Bradycardia, unspecified: Secondary | ICD-10-CM | POA: Diagnosis not present

## 2020-06-22 DIAGNOSIS — I480 Paroxysmal atrial fibrillation: Secondary | ICD-10-CM | POA: Diagnosis not present

## 2020-06-23 ENCOUNTER — Telehealth: Payer: Self-pay | Admitting: Cardiology

## 2020-06-23 NOTE — Telephone Encounter (Signed)
Pts Son was just calling in to let Dr. Meda Coffee know that the pt was recently admitted to the hospital for syncopal episode and fall, which caused her to fracture her right ankle.  Pt had surgery on her ankle.  Pt is now rehabing at [facility].  Pt was ordered to have an event monitor for 30 days placed, and ordered by our Providers in the hospital. Pt is currently wearing this monitoring system. Son states the pt will be here for her TOC appt as scheduled with Richardson Dopp PA-C on 07/06/20 at [place].  Son just wanted to make Dr. Meda Coffee aware of all of this.  Informed the pts son that I will route this message to Dr. Meda Coffee to make her aware of this, and we will continue to monitor her through the event monitor, and she should follow-up as planned with our office on 12/28, for TOC appt.  Son verbalized understanding and agrees with this plan.

## 2020-06-23 NOTE — Telephone Encounter (Signed)
Spoke to patient's son, he is requesting to have Dr. Meda Coffee or her nurse contact [facility] nursing home where his mother is to get some updated information of her care. He states that her BP keeps dropping but wasn't sure of any of the reading's.  He wants to make sure Dr. Meda Coffee is up to date on her recent care.

## 2020-06-23 NOTE — Telephone Encounter (Signed)
[facility] phone number [phone]

## 2020-06-23 NOTE — Telephone Encounter (Signed)
Transition Care Management Unsuccessful Follow-up Telephone Call  Date of discharge and from where:  06/19/20  Attempts:  2nd Attempt  Reason for unsuccessful TCM follow-up call:  Left voice message

## 2020-06-24 DIAGNOSIS — Z7901 Long term (current) use of anticoagulants: Secondary | ICD-10-CM | POA: Diagnosis not present

## 2020-06-24 NOTE — Telephone Encounter (Signed)
Patient contacted regarding discharge from [facility]  on 06/19/20.  Patient understands to follow up with provider Richardson Dopp PA-C on 07/06/20 at [place] at [facility] location. Patient understands discharge instructions? yes Patient understands medications and regiment? yes Patient understands to bring all medications to this visit? yes  Ask patient:  Are you enrolled in My Chart (yes or no)  If no ask patient if they would like to enroll.  yes

## 2020-06-26 ENCOUNTER — Telehealth: Payer: Self-pay | Admitting: Medical

## 2020-06-26 DIAGNOSIS — Z7901 Long term (current) use of anticoagulants: Secondary | ICD-10-CM | POA: Diagnosis not present

## 2020-06-26 NOTE — Telephone Encounter (Signed)
Heart monitor company called to report Afib. Patient has a known history of PAF and SSS. She was recently admitted for syncope and bradycardia. She was seen by cardiology who decreased metoprolol. Heart monitor was ordered knowing she struggled with RVR.   Heart monitor company reported first instance of afib today at 12:22 PM with rates up to 160. It lasted >30 seconds. She had subsequent episodes with rates as high as 180-190.   She has an appointment 07/06/20 with Richardson Dopp. Will inform Dr. Benjiman Core Kathlen Mody, PA-C

## 2020-06-28 DIAGNOSIS — Z7901 Long term (current) use of anticoagulants: Secondary | ICD-10-CM | POA: Diagnosis not present

## 2020-06-30 ENCOUNTER — Telehealth: Payer: Self-pay | Admitting: *Deleted

## 2020-06-30 NOTE — Telephone Encounter (Signed)
Called pt since she is overdue for Anticoagulation Appt; had to leave a message for the pt to call back.

## 2020-07-02 DIAGNOSIS — Z7901 Long term (current) use of anticoagulants: Secondary | ICD-10-CM | POA: Diagnosis not present

## 2020-07-05 ENCOUNTER — Telehealth: Payer: Self-pay | Admitting: *Deleted

## 2020-07-05 DIAGNOSIS — Z7901 Long term (current) use of anticoagulants: Secondary | ICD-10-CM | POA: Diagnosis not present

## 2020-07-05 NOTE — Telephone Encounter (Addendum)
Received 3 reports from [employer].  All 07/03/20:  10:59 am Auto Trigger Report for Atrial Fibrillation RVR Non sustained, Sinus Rhythm. Rate 170-200.   11:01 am Auto Trigger Report for Atrial Fibrillation RVR sustained, Sinus Rhythm with Couplet PVCs/MF PVCs (5 in 1 min). Rate 170.  12:19 pm Auto Trigger  Report for Atrial Fibrillation RVR sustained with Couplet PVC's/PVC's (2 in 1 min)/Artifact. Rate 170-180.  Called [facility] where the patient is currently doing rehab. Spoke to RN on duty today. She when back in the notes for 07/03/20. Patient had no complaints and completed physical therapy that day. Heart rates that she has gotten today have been in the 70's.   Of note appointment in office 07/21/20 Will take to DOD for review: Dr. Mayford Knife.  Dr. Mayford Knife reviewed the strips; PAF, see afib clinic tomorrow.   Appointment made at 3 pm 12/28/21in the afib clinic. [facility] to transport.

## 2020-07-06 ENCOUNTER — Ambulatory Visit (HOSPITAL_COMMUNITY)
Admission: RE | Admit: 2020-07-06 | Discharge: 2020-07-06 | Disposition: A | Discharge status: Home / Self Care | Payer: Medicare Other | Source: Ambulatory Visit | Attending: Nurse Practitioner | Admitting: Nurse Practitioner

## 2020-07-06 ENCOUNTER — Other Ambulatory Visit: Payer: Self-pay

## 2020-07-06 ENCOUNTER — Ambulatory Visit: Payer: Medicare Other | Admitting: Physician Assistant

## 2020-07-06 VITALS — BP 176/84 | HR 65 | Ht 63.0 in | Wt 148.4 lb

## 2020-07-06 DIAGNOSIS — Z8249 Family history of ischemic heart disease and other diseases of the circulatory system: Secondary | ICD-10-CM | POA: Diagnosis not present

## 2020-07-06 DIAGNOSIS — Z87891 Personal history of nicotine dependence: Secondary | ICD-10-CM | POA: Insufficient documentation

## 2020-07-06 DIAGNOSIS — I480 Paroxysmal atrial fibrillation: Secondary | ICD-10-CM | POA: Insufficient documentation

## 2020-07-06 DIAGNOSIS — Z7901 Long term (current) use of anticoagulants: Secondary | ICD-10-CM | POA: Diagnosis not present

## 2020-07-06 DIAGNOSIS — D6869 Other thrombophilia: Secondary | ICD-10-CM | POA: Insufficient documentation

## 2020-07-06 DIAGNOSIS — I10 Essential (primary) hypertension: Secondary | ICD-10-CM | POA: Insufficient documentation

## 2020-07-06 DIAGNOSIS — Z886 Allergy status to analgesic agent status: Secondary | ICD-10-CM | POA: Insufficient documentation

## 2020-07-06 LAB — PROTIME-INR
INR: 2.9 — ABNORMAL HIGH (ref 0.8–1.2)
Prothrombin Time: 29.4 seconds — ABNORMAL HIGH (ref 11.4–15.2)

## 2020-07-07 ENCOUNTER — Ambulatory Visit: Payer: Self-pay

## 2020-07-07 ENCOUNTER — Encounter (HOSPITAL_COMMUNITY): Payer: Self-pay | Admitting: Nurse Practitioner

## 2020-07-07 ENCOUNTER — Telehealth: Payer: Self-pay | Admitting: Family Medicine

## 2020-07-07 ENCOUNTER — Encounter: Payer: Self-pay | Admitting: Orthopaedic Surgery

## 2020-07-07 ENCOUNTER — Ambulatory Visit (INDEPENDENT_AMBULATORY_CARE_PROVIDER_SITE_OTHER): Payer: Medicare Other | Admitting: Orthopaedic Surgery

## 2020-07-07 DIAGNOSIS — M25571 Pain in right ankle and joints of right foot: Secondary | ICD-10-CM | POA: Diagnosis not present

## 2020-07-07 NOTE — Telephone Encounter (Signed)
Left message for patient to call back and schedule Medicare Annual Wellness Visit (AWV) either virtually or in office.   Last AWV no information please schedule at anytime with [facility] Nurse Health Advisor 1 or 2   This should be a 45 minute visit. 

## 2020-07-07 NOTE — Progress Notes (Signed)
Post-Op Visit Note   Patient: Cynthia Mathis           Date of Birth: 1933-04-10           MRN: [id number] Visit Date: 07/07/2020 PCP: Nelwyn Salisbury, MD   Assessment & Plan:  Chief Complaint:  Chief Complaint  Patient presents with  . Right Ankle - Follow-up, Routine Post Op   Visit Diagnoses:  1. Pain in right ankle and joints of right foot     Plan: Patient is a pleasant 86 year old female who comes in today 3 weeks out ORIF right trimalleolar ankle fracture.  She has been doing well.  She has minimal pain.  She has been compliant nonweightbearing to the right lower extremity.  She is currently in a rehab facility with hopes of being discharged later this week.  She has been elevating for swelling.  Examination of her right ankle reveals well-healing surgical incisions with nylon sutures in place.  Moderate swelling throughout of the right ankle.  No evidence of infection or cellulitis.  Calf is soft nontender.  She is neurovascularly intact distally.  Today, sutures were removed and Steri-Strips applied.  We will transition the patient into a cam walker nonweightbearing.  She will continue to ice and elevate as needed for pain and swelling.  Follow-up with [place] in 3 weeks time for repeat evaluation and 3 view x-rays of the right ankle.  Call with concerns or questions in the meantime.  Follow-Up Instructions: Return in about 3 weeks (around 07/28/2020).   Orders:  Orders Placed This Encounter  Procedures  . XR Ankle Complete Right   No orders of the defined types were placed in this encounter.   Imaging: XR Ankle Complete Right  Result Date: 07/07/2020 X-rays show well aligned fracture without hardware complication   PMFS History: Patient Active Problem List   Diagnosis Date Noted  . Closed displaced trimalleolar fracture of right ankle 06/10/2020  . Leukocytosis 06/10/2020  . Bradycardia 06/10/2020  . Syncope 06/10/2020  . Aortic atherosclerosis (HCC) 05/10/2020   . Secondary hypercoagulable state (HCC) 04/23/2020  . Posterior vitreous detachment of right eye 12/02/2019  . Exudative age-related macular degeneration of left eye with active choroidal neovascularization (HCC) 10/28/2019  . Exudative age-related macular degeneration of right eye with active choroidal neovascularization (HCC) 10/28/2019  . Retinal hemorrhage of left eye 10/28/2019  . Malignant neoplasm of upper-outer quadrant of right breast in female, estrogen receptor positive (HCC) 03/26/2019  . Mild cognitive impairment 03/28/2018  . Foot pain, right 12/05/2017  . Hx of colonic polyps   . Diverticulosis of colon   . Anxiety   . Paroxysmal atrial fibrillation (HCC) 08/24/2016  . Low back pain 05/06/2015  . Osteopenia 08/04/2013  . Memory loss 09/17/2012  . 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 Macular degeneration (senile) of retina 08/12/2008  . Osteoarthritis 08/12/2008  . Impaired fasting glucose 02/10/2008  . Hypothyroidism 08/08/2007  . Dyslipidemia 08/08/2007  . Essential hypertension 08/08/2007   Past Medical History:  Diagnosis Date  . Anxiety   . Borderline diabetes mellitus   . Cancer (HCC) 2020   breast  . Diverticulosis of colon   . DJD (degenerative joint disease)   . Dyslipidemia   . History of hematuria   . History of osteoporosis   . Hx of colonic polyps   . Hypertension   . Hypothyroid   . Macular degeneration    sees Dr. Elmer Picker for general eye care, sees Dr. Luciana Axe for retinal injections   .  Memory loss   . Osteoporosis    took bisphosphonates, now off. Last DEXA in 2015 showed osteopenia   . Paroxysmal atrial fibrillation (HCC)    sees [name]   . SCCA (squamous cell carcinoma) of skin 12/11/2019   Left Hand Posterior (in situ)    Family History  Problem Relation Age of Onset  . Stroke Mother   . Colon cancer Neg Hx   . Stomach cancer Neg Hx     Past Surgical History:  Procedure Laterality Date  . APPENDECTOMY  1947  .  BREAST LUMPECTOMY WITH RADIOACTIVE SEED LOCALIZATION Right 05/14/2019   Procedure: RIGHT BREAST LUMPECTOMY WITH RADIOACTIVE SEED LOCALIZATION;  Surgeon: [name], MD;  Location: [place];  Service: General;  Laterality: Right;  . CATARACT EXTRACTION W/PHACO Bilateral   . COLONOSCOPY  12/02/2014   per [name], severe diverticulosis but no polyps   . MASS EXCISION  04/17/2012   Procedure: EXCISION MASS;  Surgeon: [name], MD;  Location: [place];  Service: General;  Laterality: N/A;  . ORIF ANKLE FRACTURE Right 06/14/2020   Procedure: OPEN REDUCTION INTERNAL FIXATION (ORIF) ANKLE FRACTURE;  Surgeon: [name], MD;  Location: [place];  Service: Orthopedics;  Laterality: Right;  . THYROIDECTOMY  1990   [name]   Social History   Occupational History  . Occupation: Education officer, environmental: RETIRED  Tobacco Use  . Smoking status: Former Smoker    Quit date: 07/10/1965    Years since quitting: 55.0  . Smokeless tobacco: Never Used  . Tobacco comment: 3 cigs per week x 3 years (started in 1964), 12/27/15 does not smoke  Vaping Use  . Vaping Use: Never used  Substance and Sexual Activity  . Alcohol use: Yes    Alcohol/week: 3.0 standard drinks    Types: 3 Glasses of wine per week    Comment: occasionally  . Drug use: No  . Sexual activity: Not on file

## 2020-07-07 NOTE — Progress Notes (Addendum)
Primary Care Physician: [name], MD Primary Cardiologist: [name] Primary Electrophysiologist: none Referring Physician: [facility]   Cynthia Mathis is a 86 y.o. female with a history of DM, HLD, HTN, and paroxysmal atrial fibrillation who presents for consultation in the [facility]. The patient was initially diagnosed with atrial fibrillation 06/29/16 after presenting to the ED with symptoms of palpitations. Patient is on warfarin for a CHADS2VASC score of 5. Patient had done well and was in her usual state of health until 04/19/20 when she started to feel palpitations with SOB. EMS was called and she was in afib with RVR. She was given diltiazem en route and converted to SR on arrival to the ED. Patient reports this is her first episode of afib in several years. There were no specific triggers that she could identify. She has not had any further palpitations.   F/u in afib clinic at the referral of Dr. Radford Pax. Pt was in the hospital for syncope earlier December and  broke her rt foot with the fall. She  is now in a rehab facility. She is wearing an event monitor. The event monitor showed 2-3 minutes of afib on Christmas Day, at which time the son reports that she was at home with the family for Christmas and she was struggling with her walker to go to the bathroom. By my estimation, she has about another week on the monitor. Her BB was greatly reduced for syncope while in the hospital. She was unaware of the episode. She continues on warfarin for a CHA2DS2VASc score of 5.   Today, she denies symptoms of palpitations, chest pain, shortness of breath, orthopnea, PND, lower extremity edema, dizziness, presyncope, syncope, snoring, daytime somnolence, bleeding, or neurologic sequela. The patient is tolerating medications without difficulties and is otherwise without complaint today.    Atrial Fibrillation Risk Factors:  she does not have symptoms or  diagnosis of sleep apnea. she does not have a history of rheumatic fever. she does not have a history of alcohol use.   she has a BMI of Body mass index is 26.29 kg/m.[name] Filed Weights   07/06/20 1503  Weight: 67.3 kg    Family History  Problem Relation Age of Onset  . Stroke Mother   . Colon cancer Neg Hx   . Stomach cancer Neg Hx      Atrial Fibrillation Management history:  Previous antiarrhythmic drugs: none Previous cardioversions: none Previous ablations: none CHADS2VASC score: 5 Anticoagulation history: warfarin   Past Medical History:  Diagnosis Date  . Anxiety   . Borderline diabetes mellitus   . Cancer ([place]) 2020   breast  . Diverticulosis of colon   . DJD (degenerative joint disease)   . Dyslipidemia   . History of hematuria   . History of osteoporosis   . Hx of colonic polyps   . Hypertension   . Hypothyroid   . Macular degeneration    sees [name] for general eye care, sees [name] for retinal injections   . Memory loss   . Osteoporosis    took bisphosphonates, now off. Last DEXA in 2015 showed osteopenia   . Paroxysmal atrial fibrillation (HCC)    sees [name]   . SCCA (squamous cell carcinoma) of skin 12/11/2019   Left Hand Posterior (in situ)   Past Surgical History:  Procedure Laterality Date  . APPENDECTOMY  1947  . BREAST LUMPECTOMY WITH RADIOACTIVE SEED LOCALIZATION Right 05/14/2019  Procedure: RIGHT BREAST LUMPECTOMY WITH RADIOACTIVE SEED LOCALIZATION;  Surgeon: [name], MD;  Location: [place];  Service: General;  Laterality: Right;  . CATARACT EXTRACTION W/PHACO Bilateral   . COLONOSCOPY  12/02/2014   per [name], severe diverticulosis but no polyps   . MASS EXCISION  04/17/2012   Procedure: EXCISION MASS;  Surgeon: [name], MD;  Location: [place];  Service: General;  Laterality: N/A;  . ORIF ANKLE FRACTURE Right 06/14/2020   Procedure: OPEN REDUCTION INTERNAL FIXATION (ORIF) ANKLE  FRACTURE;  Surgeon: [name], MD;  Location: [place];  Service: Orthopedics;  Laterality: Right;  . THYROIDECTOMY  1990   [name]    Current Outpatient Medications  Medication Sig Dispense Refill  . anastrozole (ARIMIDEX) 1 MG tablet Take 1 tablet (1 mg total) by mouth daily. 90 tablet 0  . Calcium Carbonate-Vitamin D 600-400 MG-UNIT tablet Take 1 tablet by mouth daily.    . Cyanocobalamin (VITAMIN B 12) 500 MCG TABS Take 1,000 mcg by mouth daily.     [name] diltiazem (CARDIZEM) 30 MG tablet Take 1 tablet every 4 hours AS NEEDED for afib heart rate >100 as long as top BP >100. (Patient taking differently: Take 30 mg by mouth every 4 (four) hours as needed (for afib heart rate >100 as long as top BP >100.).) 45 tablet 1  . docusate sodium (COLACE) 100 MG capsule Take 1 capsule (100 mg total) by mouth 2 (two) times daily as needed for mild constipation. 10 capsule 0  . fish oil-omega-3 fatty acids 1000 MG capsule Take 1 g by mouth daily.    [name] GLUCOSAMINE-CHONDROITIN PO Take 1 tablet by mouth 3 (three) times daily.     [name] HYDROcodone-acetaminophen (NORCO/VICODIN) 5-325 MG tablet Take 1 tablet by mouth every 6 (six) hours as needed for moderate pain. 20 tablet 0  . losartan-hydrochlorothiazide (HYZAAR) 100-25 MG tablet Take 1 tablet by mouth daily. 90 tablet 3  . memantine (NAMENDA) 10 MG tablet TAKE ONE TABLET BY MOUTH TWICE A DAY (Patient taking differently: Take 10 mg by mouth 2 (two) times daily.) 180 tablet 3  . metoprolol tartrate (LOPRESSOR) 25 MG tablet Take 0.5 tablets (12.5 mg total) by mouth 2 (two) times daily. 60 tablet 0  . Multiple Vitamins-Minerals (MULTIVITAMIN & MINERAL PO) Take 1 tablet by mouth daily.    [name] warfarin (COUMADIN) 5 MG tablet Take as directed by Coumadin Clinic (Patient taking differently: Take 2.5 mg by mouth daily.) 60 tablet 1   No current facility-administered medications for this encounter.    Allergies  Allergen Reactions  . Epinephrine Palpitations     heart racing    Social History   Socioeconomic History  . Marital status: Widowed    Spouse name: Mariane Masters Rogness  . Number of children: 3  . Years of education: [employer]  . Highest education level: Not on file  Occupational History  . Occupation: Education officer, environmental: RETIRED  Tobacco Use  . Smoking status: Former Smoker    Quit date: 07/10/1965    Years since quitting: 55.0  . Smokeless tobacco: Never Used  . Tobacco comment: 3 cigs per week x 3 years (started in 1964), 12/27/15 does not smoke  Vaping Use  . Vaping Use: Never used  Substance and Sexual Activity  . Alcohol use: Yes    Alcohol/week: 3.0 standard drinks    Types: 3 Glasses of wine per week    Comment: occasionally  .  Drug use: No  . Sexual activity: Not on file  Other Topics Concern  . Not on file  Social History Narrative   03/26/19 Pt lives at home alone now, widow.   her spouse passed Dec 2019 Rande Lawman Downie, who has been dx'd with mild cognitive impairment vs. mild dementia, former pt of Dr. Sandria Manly). She does not use caffeine, if so, rarely.   Social Determinants of Health   Financial Resource Strain: Low Risk   . Difficulty of Paying Living Expenses: Not hard at all  Food Insecurity: Not on file  Transportation Needs: No Transportation Needs  . Lack of Transportation (Medical): No  . Lack of Transportation (Non-Medical): No  Physical Activity: Not on file  Stress: Not on file  Social Connections: Not on file  Intimate Partner Violence: Not on file     ROS- All systems are reviewed and negative except as per the HPI above.  Physical Exam: Vitals:   07/06/20 1503  BP: (!) 176/84  Pulse: 65  Weight: 67.3 kg  Height: 5\' 3"  (1.6 m)    GEN- The patient is well appearing elderly female, alert and oriented x 3 today.   Head- normocephalic, atraumatic Eyes-  Sclera clear, conjunctiva pink Ears- hearing intact Oropharynx- clear Neck- supple  Lungs- Clear to ausculation bilaterally, normal work  of breathing Heart- Regular rate and rhythm, bradycardia, no murmurs, rubs or gallops  GI- soft, NT, ND, + BS Extremities- no clubbing, cyanosis, or edema MS- no significant deformity or atrophy Skin- no rash or lesion Psych- euthymic mood, full affect Neuro- strength and sensation are intact  Wt Readings from Last 3 Encounters:  07/06/20 67.3 kg  06/12/20 65.8 kg  05/10/20 66.4 kg    EKG today demonstrates  NSR at 65 bpm, pr int 148 ms, qrs int 66 ms, qtc 447 ms  Event strips reviewed form 12/25 at 11 am, 1-3 minutes duration   Echo 09/11/16 demonstrated  - Left ventricle: The cavity size was normal. There was moderate  focal basal hypertrophy of the septum with mild posterior wall  hypertrophy. Systolic function was normal. The estimated ejection  fraction was in the range of 60% to 65%. Wall motion was normal;  there were no regional wall motion abnormalities. Doppler  parameters are consistent with abnormal left ventricular  relaxation (grade 1 diastolic dysfunction). Doppler parameters  are consistent with indeterminate ventricular filling pressure.  - Aortic valve: Transvalvular velocity was within the normal range.  There was no stenosis. There was mild regurgitation.  - Right ventricle: The cavity size was normal. Wall thickness was  normal. Systolic function was normal.  - Atrial septum: No defect or patent foramen ovale was identified  by color flow Doppler.  - Tricuspid valve: There was mild regurgitation.  - Pulmonary arteries: Systolic pressure was within the normal  range. PA peak pressure: 33 mm Hg (S).   Epic records are reviewed at length today  CHA2DS2-VASc Score = 5  The patient's score is based upon: CHF History: No HTN History: Yes Diabetes History: Yes Stroke History: No Vascular Disease History: No Age Score: 2 Gender Score: 1      ASSESSMENT AND PLAN: 1. Paroxysmal Atrial Fibrillation (ICD10:  I48.0) The patient's  CHA2DS2-VASc score is 5, indicating a 7.2% annual risk of stroke.   Continue  warfarin  Pt had just a few minutes of afib that she was unaware of I will not make any changes for now as she recently had syncope and do  not feel comfortable  increasing BB Finish wearing event  monitor  Continue metoprolol tartrate at 12.5 mg bid (previously was on Toprol 100 mg daily, reduced with recent hospitalization for syncope)  2. Secondary Hypercoagulable State (ICD10:  D68.69) The patient is at significant risk for stroke/thromboembolism based upon her CHA2DS2-VASc Score of 5.  Continue Warfarin (Coumadin).   3. HTN Elevated,  on presentation recheck at  156/86  F/u with Truitt Merle, NP 1/12    Geroge Baseman. Ayomide Zuleta, [facility] [address] [place], [place] [address] [phone]

## 2020-07-08 DIAGNOSIS — Z7901 Long term (current) use of anticoagulants: Secondary | ICD-10-CM | POA: Diagnosis not present

## 2020-07-12 ENCOUNTER — Telehealth: Payer: Self-pay

## 2020-07-12 NOTE — Telephone Encounter (Signed)
Received critical monitor that showed patient was- Atrial Fibrillation RVR Sustained. Sinus Rhythm w/Atrial Run/PVCs (2 in 1 min)/ PACs/Artifact at 11:34 AM ET on 07/11/20.  Called patient about monitor. Patients stated she was up using the bathroom at the time and felt her heart race, but then she felt fine after she was done and sitting down again. Consulted, DOD, Dr. Delton See, monitor did show A. FIB, palpitations, no dizziness, will continue to monitor. Patient on coumadin.

## 2020-07-13 ENCOUNTER — Encounter: Payer: Self-pay | Admitting: Family Medicine

## 2020-07-13 ENCOUNTER — Ambulatory Visit (INDEPENDENT_AMBULATORY_CARE_PROVIDER_SITE_OTHER): Payer: Medicare Other | Admitting: Ophthalmology

## 2020-07-13 ENCOUNTER — Telehealth: Payer: Self-pay | Admitting: Cardiology

## 2020-07-13 ENCOUNTER — Encounter (INDEPENDENT_AMBULATORY_CARE_PROVIDER_SITE_OTHER): Payer: Self-pay | Admitting: Ophthalmology

## 2020-07-13 ENCOUNTER — Other Ambulatory Visit: Payer: Self-pay

## 2020-07-13 ENCOUNTER — Encounter (INDEPENDENT_AMBULATORY_CARE_PROVIDER_SITE_OTHER): Payer: Medicare Other | Admitting: Ophthalmology

## 2020-07-13 ENCOUNTER — Telehealth: Payer: Self-pay | Admitting: Pharmacist

## 2020-07-13 DIAGNOSIS — H353211 Exudative age-related macular degeneration, right eye, with active choroidal neovascularization: Secondary | ICD-10-CM | POA: Diagnosis not present

## 2020-07-13 DIAGNOSIS — H353221 Exudative age-related macular degeneration, left eye, with active choroidal neovascularization: Secondary | ICD-10-CM | POA: Diagnosis not present

## 2020-07-13 MED ORDER — BEVACIZUMAB 2.5 MG/0.1ML IZ SOSY
2.5000 mg | PREFILLED_SYRINGE | INTRAVITREAL | Status: AC | PRN
  Administered 2020-07-13: 2.5 mg via INTRAVITREAL

## 2020-07-13 NOTE — Telephone Encounter (Signed)
Called and spoke with son per DPR. Patient was d/c from skilled nursing on 12/31. MD there changed her dose to 2mg  daily. INR was 2.9 on 12/28 at afib clinic, but I dont see that it was sent to 1/29. Apt made for tomorrow at 3:30 to recheck INR.

## 2020-07-13 NOTE — Assessment & Plan Note (Signed)
OD, stable at 40-month follow-up today, will repeat injection today to maintain and prevent recurrence of CNVM as minor CME is present with intraretinal fluid perifoveal.

## 2020-07-13 NOTE — Assessment & Plan Note (Signed)
OS today currently at 22-month follow-up, will monitor exam

## 2020-07-13 NOTE — Chronic Care Management (AMB) (Signed)
Chronic Care Management Pharmacy Assistant   Name: Cynthia Mathis  MRN: [id number] DOB: 03-16-1933  Reason for Encounter: Medication Review  PCP : Nelwyn Salisbury, MD  Allergies:   Allergies  Allergen Reactions  . Epinephrine Palpitations    heart racing    Medications: Outpatient Encounter Medications as of 07/13/2020  Medication Sig  . anastrozole (ARIMIDEX) 1 MG tablet Take 1 tablet (1 mg total) by mouth daily.  . Calcium Carbonate-Vitamin D 600-400 MG-UNIT tablet Take 1 tablet by mouth daily.  . Cyanocobalamin (VITAMIN B 12) 500 MCG TABS Take 1,000 mcg by mouth daily.   [name] diltiazem (CARDIZEM) 30 MG tablet Take 1 tablet every 4 hours AS NEEDED for afib heart rate >100 as long as top BP >100. (Patient taking differently: Take 30 mg by mouth every 4 (four) hours as needed (for afib heart rate >100 as long as top BP >100.).)  . docusate sodium (COLACE) 100 MG capsule Take 1 capsule (100 mg total) by mouth 2 (two) times daily as needed for mild constipation.  . fish oil-omega-3 fatty acids 1000 MG capsule Take 1 g by mouth daily.  [name] GLUCOSAMINE-CHONDROITIN PO Take 1 tablet by mouth 3 (three) times daily.   [name] HYDROcodone-acetaminophen (NORCO/VICODIN) 5-325 MG tablet Take 1 tablet by mouth every 6 (six) hours as needed for moderate pain.  [name] losartan-hydrochlorothiazide (HYZAAR) 100-25 MG tablet Take 1 tablet by mouth daily.  . memantine (NAMENDA) 10 MG tablet TAKE ONE TABLET BY MOUTH TWICE A DAY (Patient taking differently: Take 10 mg by mouth 2 (two) times daily.)  . metoprolol tartrate (LOPRESSOR) 25 MG tablet Take 0.5 tablets (12.5 mg total) by mouth 2 (two) times daily.  . Multiple Vitamins-Minerals (MULTIVITAMIN & MINERAL PO) Take 1 tablet by mouth daily.  [name] warfarin (COUMADIN) 5 MG tablet Take as directed by [facility] Clinic (Patient taking differently: Take 2.5 mg by mouth daily.)   No facility-administered encounter medications on file as of 07/13/2020.    Current  Diagnosis: Patient Active Problem List   Diagnosis Date Noted  . Closed displaced trimalleolar fracture of right ankle 06/10/2020  . Leukocytosis 06/10/2020  . Bradycardia 06/10/2020  . Syncope 06/10/2020  . Aortic atherosclerosis (HCC) 05/10/2020  . Secondary hypercoagulable state (HCC) 04/23/2020  . Posterior vitreous detachment of right eye 12/02/2019  . Exudative age-related macular degeneration of left eye with active choroidal neovascularization (HCC) 10/28/2019  . Exudative age-related macular degeneration of right eye with active choroidal neovascularization (HCC) 10/28/2019  . Retinal hemorrhage of left eye 10/28/2019  . Malignant neoplasm of upper-outer quadrant of right breast in female, estrogen receptor positive (HCC) 03/26/2019  . Mild cognitive impairment 03/28/2018  . Foot pain, right 12/05/2017  . Hx of colonic polyps   . Diverticulosis of colon   . Anxiety   . Paroxysmal atrial fibrillation (HCC) 08/24/2016  . Low back pain 05/06/2015  . Osteopenia 08/04/2013  . Memory loss 09/17/2012  . 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 Macular degeneration (senile) of retina 08/12/2008  . Osteoarthritis 08/12/2008  . Impaired fasting glucose 02/10/2008  . Hypothyroidism 08/08/2007  . Dyslipidemia 08/08/2007  . Essential hypertension 08/08/2007    Goals Addressed   None    Reviewed chart for medication changes ahead of medication coordination call. Reviewed OVs, Consults, or hospital visits since last care coordination call/Pharmacist visit.  [name] 07-19-2020 Nelwyn Salisbury, MD Family Medicine Reviewed medication changes indicated. Discontinued the following Medications . Fish Oil 1,000 mcg . Memantine 10 mg .  Donepezil  . Glucosamine   BP Readings from Last 3 Encounters:  07/06/20 (!) 176/84  06/19/20 (!) 129/50  05/10/20 (!) 141/66    Lab Results  Component Value Date   HGBA1C 7.4 (H) 06/11/2020     Patient obtains medications through Adherence Packaging   30 Days   Last adherence delivery included: [name] Anastrozole (ARIMIDEX) 1 mg: one tablet at dinner . Calcium Carbonate-Vitamin D 600-400 mg-unit: one at breakfast . Cyanocobalamin (VITAMIN B 12) 1000 mcg: one tablet at breakfast . Losartan-hydrochlorothiazide (HYZAAR) 100-25 mg: one tablet at breakfast . Metoprolol tartrate (LOPRESSOR) 25 mg:  tablet (12.5 mg) at breakfast and  at dinner . Multiple Vitamins-Minerals: one tablet at breakfast . Glucosamine . Fish Oil 1,000 mcg . Memantine 10 mg: one tablet twice a day Patient is due for next adherence delivery on: 07/21/2020. Called patient and reviewed medications and coordinated delivery.  This delivery to include: . Anastrozole (ARIMIDEX) 1 mg: one tablet at dinner . Calcium Carbonate-Vitamin D 600-400 mg-unit: one at breakfast . Cyanocobalamin (VITAMIN B 12) 1000 mcg: one tablet at breakfast . Losartan-hydrochlorothiazide (HYZAAR) 100-25 mg: one tablet at breakfast . Metoprolol tartrate (LOPRESSOR) 25 mg:  tablet (12.5 mg) at breakfast and  at dinner . Multiple Vitamins-Minerals: one tablet at breakfast . Warfarin (Coumadin) 2 mg Take 1/2 a tablet to 1 tablet by mouth daily as directed by the coumadin clinic.  I spoke with the patient and review medications. There are no changes in medications currently. Patient declined these last month due to PRN use/additional supply on hand.The patient is taking the following medications: . Diltiazem (CARDIZEM) 30 mg tablet PRN  She does not currently need refill Confirmed delivery date of 07/21/2020, advised patient that pharmacy will contact them the morning of delivery.  Follow-Up:  Coordination of Enhanced Pharmacy Services and Pharmacist Review   Berenice Bouton, [facility] Clinical Pharmacy Assistant [phone]

## 2020-07-13 NOTE — Telephone Encounter (Signed)
Patients son is calling stating that she was released from the hospital on 07/09/2020 and on her discharge paper it was hand written that she is suppose to have an appointment with the Coumadin clinic. There is no documentation of this on Epic. Please advise.

## 2020-07-13 NOTE — Progress Notes (Signed)
07/13/2020     CHIEF COMPLAINT Patient presents for Retina Follow Up (3 Month F/U OD, poss Avastin OD//Pt denies noticeable changes to [place] OU since last visit. Pt denies ocular pain, flashes of light, or floaters OU. //)   HISTORY OF PRESENT ILLNESS: Cynthia Mathis is a 86 y.o. female who presents to the clinic today for:   HPI    Retina Follow Up    Patient presents with  Wet AMD.  In right eye.  This started 3 months ago.  Severity is mild.  Duration of 3 months.  Since onset it is stable. Additional comments: 3 Month F/U OD, poss Avastin OD  Pt denies noticeable changes to [place] OU since last visit. Pt denies ocular pain, flashes of light, or floaters OU.          Last edited by Ileana Roup, COA on 07/13/2020 10:44 AM. (History)      Referring physician: Nelwyn Salisbury, MD [id number] [place],  [place] [address]  HISTORICAL INFORMATION:   Selected notes from the MEDICAL RECORD NUMBER    Lab Results  Component Value Date   HGBA1C 7.4 (H) 06/11/2020     CURRENT MEDICATIONS: No current outpatient medications on file. (Ophthalmic Drugs)   No current facility-administered medications for this visit. (Ophthalmic Drugs)   Current Outpatient Medications (Other)  Medication Sig  . anastrozole (ARIMIDEX) 1 MG tablet Take 1 tablet (1 mg total) by mouth daily.  . Calcium Carbonate-Vitamin D 600-400 MG-UNIT tablet Take 1 tablet by mouth daily.  . Cyanocobalamin (VITAMIN B 12) 500 MCG TABS Take 1,000 mcg by mouth daily.   [name] diltiazem (CARDIZEM) 30 MG tablet Take 1 tablet every 4 hours AS NEEDED for afib heart rate >100 as long as top BP >100. (Patient taking differently: Take 30 mg by mouth every 4 (four) hours as needed (for afib heart rate >100 as long as top BP >100.).)  . docusate sodium (COLACE) 100 MG capsule Take 1 capsule (100 mg total) by mouth 2 (two) times daily as needed for mild constipation.  . fish oil-omega-3 fatty acids 1000 MG capsule Take 1 g by mouth  daily.  [name] GLUCOSAMINE-CHONDROITIN PO Take 1 tablet by mouth 3 (three) times daily.   [name] HYDROcodone-acetaminophen (NORCO/VICODIN) 5-325 MG tablet Take 1 tablet by mouth every 6 (six) hours as needed for moderate pain.  [name] losartan-hydrochlorothiazide (HYZAAR) 100-25 MG tablet Take 1 tablet by mouth daily.  . memantine (NAMENDA) 10 MG tablet TAKE ONE TABLET BY MOUTH TWICE A DAY (Patient taking differently: Take 10 mg by mouth 2 (two) times daily.)  . metoprolol tartrate (LOPRESSOR) 25 MG tablet Take 0.5 tablets (12.5 mg total) by mouth 2 (two) times daily.  . Multiple Vitamins-Minerals (MULTIVITAMIN & MINERAL PO) Take 1 tablet by mouth daily.  [name] warfarin (COUMADIN) 5 MG tablet Take as directed by Coumadin Clinic (Patient taking differently: Take 2.5 mg by mouth daily.)   No current facility-administered medications for this visit. (Other)      REVIEW OF SYSTEMS:    ALLERGIES Allergies  Allergen Reactions  . Epinephrine Palpitations    heart racing    PAST MEDICAL HISTORY Past Medical History:  Diagnosis Date  . Anxiety   . Borderline diabetes mellitus   . Cancer (HCC) 2020   breast  . Diverticulosis of colon   . DJD (degenerative joint disease)   . Dyslipidemia   . History of hematuria   . History of osteoporosis   .  Hx of colonic polyps   . Hypertension   . Hypothyroid   . Macular degeneration    sees [name] for general eye care, sees [name] for retinal injections   . Memory loss   . Osteoporosis    took bisphosphonates, now off. Last DEXA in 2015 showed osteopenia   . Paroxysmal atrial fibrillation (HCC)    sees [name]   . SCCA (squamous cell carcinoma) of skin 12/11/2019   Left Hand Posterior (in situ)   Past Surgical History:  Procedure Laterality Date  . APPENDECTOMY  1947  . BREAST LUMPECTOMY WITH RADIOACTIVE SEED LOCALIZATION Right 05/14/2019   Procedure: RIGHT BREAST LUMPECTOMY WITH RADIOACTIVE SEED LOCALIZATION;  Surgeon: [name],  MD;  Location: [facility];  Service: General;  Laterality: Right;  . CATARACT EXTRACTION W/PHACO Bilateral   . COLONOSCOPY  12/02/2014   per [name], severe diverticulosis but no polyps   . MASS EXCISION  04/17/2012   Procedure: EXCISION MASS;  Surgeon: [name], MD;  Location: [facility];  Service: General;  Laterality: N/A;  . ORIF ANKLE FRACTURE Right 06/14/2020   Procedure: OPEN REDUCTION INTERNAL FIXATION (ORIF) ANKLE FRACTURE;  Surgeon: [name], MD;  Location: [facility];  Service: Orthopedics;  Laterality: Right;  . THYROIDECTOMY  1990   [name]    FAMILY HISTORY Family History  Problem Relation Age of Onset  . Stroke Mother   . Colon cancer Neg Hx   . Stomach cancer Neg Hx     SOCIAL HISTORY Social History   Tobacco Use  . Smoking status: Former Smoker    Quit date: 07/10/1965    Years since quitting: 55.0  . Smokeless tobacco: Never Used  . Tobacco comment: 3 cigs per week x 3 years (started in 1964), 12/27/15 does not smoke  Vaping Use  . Vaping Use: Never used  Substance Use Topics  . Alcohol use: Yes    Alcohol/week: 3.0 standard drinks    Types: 3 Glasses of wine per week    Comment: occasionally  . Drug use: No         OPHTHALMIC EXAM:  Base Eye Exam    Visual Acuity (ETDRS)      Right Left   Dist [place] 20/50 -2 20/30   Dist ph [place] NI NI       Tonometry (Tonopen, 10:44 AM)      Right Left   Pressure 15 16       Pupils      Pupils Dark Light Shape React APD   Right PERRL 2 2 Round Minimal None   Left PERRL 2 2 Round Minimal None       Visual Fields (Counting fingers)      Left Right    Full Full       Extraocular Movement      Right Left    Full Full       Neuro/Psych    Oriented x3: Yes   Mood/Affect: Normal       Dilation    Right eye: 1.0% Mydriacyl, 2.5% Phenylephrine @ 10:47 AM        Slit Lamp and Fundus Exam    External Exam      Right Left   External Normal Normal       Slit Lamp Exam       Right Left   Lids/Lashes Normal Normal   Conjunctiva/Sclera White and quiet White and quiet  Cornea Clear Clear   Anterior Chamber Deep and quiet Deep and quiet   Iris Round and reactive Round and reactive   Lens Posterior chamber intraocular lens Posterior chamber intraocular lens   Anterior Vitreous Normal Normal       Fundus Exam      Right Left   Posterior Vitreous Posterior vitreous detachment    Disc Normal    C/D Ratio 0.3    Macula Atrophy, Geographic atrophy, no macular thickening, no hemorrhage, no exudates    Vessels Normal    Periphery Normal           IMAGING AND PROCEDURES  Imaging and Procedures for 07/13/20  OCT, Retina - OU - Both Eyes       Right Eye Quality was good. Scan locations included subfoveal. Central Foveal Thickness: 233. Progression has been stable. Findings include abnormal foveal contour, intraretinal fluid.   Left Eye Quality was good. Scan locations included subfoveal. Central Foveal Thickness: 296. Progression has been stable. Findings include abnormal foveal contour.   Notes Chronic subfoveal scarring accounts for acuity yet CNVM is controlled currently at 23-month follow-up today  OS at 74-month follow-up and no signs of recurrent CNVM Option to monitor left eye by observation alone       Intravitreal Injection, Pharmacologic Agent - OD - Right Eye       Time Out 07/13/2020. 11:11 AM. Confirmed correct patient, procedure, site, and patient consented.   Anesthesia Topical anesthesia was used. Anesthetic medications included Akten 3.5%.   Procedure Preparation included 5% betadine to ocular surface, 10% betadine to eyelids, Tobramycin 0.3%, Ofloxacin . A 30 gauge needle was used.   Injection:  2.5 mg Bevacizumab (AVASTIN) 2.5mg /0.13mL SOSY   NDC: [id number], [id number]   Route: Intravitreal, Site: Right Eye  Post-op Post injection exam found visual acuity of at least counting fingers. The patient tolerated the procedure  well. There were no complications. The patient received written and verbal post procedure care education. Post injection medications were not given.                 ASSESSMENT/PLAN:  Exudative age-related macular degeneration of right eye with active choroidal neovascularization (HCC) OD, stable at 57-month follow-up today, will repeat injection today to maintain and prevent recurrence of CNVM as minor CME is present with intraretinal fluid perifoveal.  Exudative age-related macular degeneration of left eye with active choroidal neovascularization ([employer]) OS today currently at 53-month follow-up, will monitor exam      ICD-10-CM   1. Exudative age-related macular degeneration of right eye with active choroidal neovascularization (HCC)  H35.3211 OCT, Retina - OU - Both Eyes    Intravitreal Injection, Pharmacologic Agent - OD - Right Eye    bevacizumab (AVASTIN) SOSY 2.5 mg  2. Exudative age-related macular degeneration of left eye with active choroidal neovascularization ([place])  H35.3221     1.  OD, stable condition currently at 41-month follow-up, repeat injection intravitreal Avastin today to preserve acuity  2.  OS, excellent acuity, now at 59-month status post last injection Avastin thus 2.5 months off of therapy, will observe  3.  OU dilated in 3 months  Ophthalmic Meds Ordered this visit:  Meds ordered this encounter  Medications  . bevacizumab (AVASTIN) SOSY 2.5 mg       Return in about 3 months (around 10/11/2020) for DILATE OU, AVASTIN OCT, OD.  There are no Patient Instructions on file for this visit.   Explained the diagnoses, plan, and follow  up with the patient and they expressed understanding.  Patient expressed understanding of the importance of proper follow up care.   Clent Demark Marko Skalski M.D. Diseases & Surgery of the Retina [facility] 07/13/20     Abbreviations: M myopia (nearsighted); A astigmatism; H hyperopia (farsighted); P  presbyopia; Mrx spectacle prescription;  CTL contact lenses; OD right eye; OS left eye; OU both eyes  XT exotropia; ET esotropia; PEK punctate epithelial keratitis; PEE punctate epithelial erosions; DES dry eye syndrome; MGD meibomian gland dysfunction; ATs artificial tears; PFAT's preservative free artificial tears; [place] nuclear sclerotic cataract; PSC posterior subcapsular cataract; ERM epi-retinal membrane; PVD posterior vitreous detachment; RD retinal detachment; DM diabetes mellitus; DR diabetic retinopathy; NPDR non-proliferative diabetic retinopathy; PDR proliferative diabetic retinopathy; CSME clinically significant macular edema; DME diabetic macular edema; dbh dot blot hemorrhages; CWS cotton wool spot; POAG primary open angle glaucoma; C/D cup-to-disc ratio; HVF humphrey visual field; GVF goldmann visual field; OCT optical coherence tomography; IOP intraocular pressure; BRVO Branch retinal vein occlusion; CRVO central retinal vein occlusion; CRAO central retinal artery occlusion; BRAO branch retinal artery occlusion; RT retinal tear; SB scleral buckle; PPV pars plana vitrectomy; VH Vitreous hemorrhage; PRP panretinal laser photocoagulation; IVK intravitreal kenalog; VMT vitreomacular traction; MH Macular hole;  NVD neovascularization of the disc; NVE neovascularization elsewhere; AREDS age related eye disease study; ARMD age related macular degeneration; POAG primary open angle glaucoma; EBMD epithelial/anterior basement membrane dystrophy; ACIOL anterior chamber intraocular lens; IOL intraocular lens; PCIOL posterior chamber intraocular lens; Phaco/IOL phacoemulsification with intraocular lens placement; [place] photorefractive keratectomy; LASIK laser assisted in situ keratomileusis; HTN hypertension; DM diabetes mellitus; COPD chronic obstructive pulmonary disease

## 2020-07-14 ENCOUNTER — Telehealth: Payer: Self-pay

## 2020-07-14 ENCOUNTER — Ambulatory Visit (INDEPENDENT_AMBULATORY_CARE_PROVIDER_SITE_OTHER): Payer: Medicare Other | Admitting: *Deleted

## 2020-07-14 DIAGNOSIS — E1151 Type 2 diabetes mellitus with diabetic peripheral angiopathy without gangrene: Secondary | ICD-10-CM | POA: Diagnosis not present

## 2020-07-14 DIAGNOSIS — Z5181 Encounter for therapeutic drug level monitoring: Secondary | ICD-10-CM

## 2020-07-14 DIAGNOSIS — I480 Paroxysmal atrial fibrillation: Secondary | ICD-10-CM

## 2020-07-14 DIAGNOSIS — M80071D Age-related osteoporosis with current pathological fracture, right ankle and foot, subsequent encounter for fracture with routine healing: Secondary | ICD-10-CM | POA: Diagnosis not present

## 2020-07-14 DIAGNOSIS — I10 Essential (primary) hypertension: Secondary | ICD-10-CM | POA: Diagnosis not present

## 2020-07-14 DIAGNOSIS — E46 Unspecified protein-calorie malnutrition: Secondary | ICD-10-CM | POA: Diagnosis not present

## 2020-07-14 LAB — POCT INR: INR: 3.7 — AB (ref 2.0–3.0)

## 2020-07-14 MED ORDER — WARFARIN SODIUM 2 MG PO TABS: ORAL_TABLET | ORAL | 1 refills | Status: DC

## 2020-07-14 NOTE — Progress Notes (Deleted)
CARDIOLOGY OFFICE NOTE  Date:  07/20/2020    Keshona Pillsbury Date of Birth: Aug 06, 1932 Medical Record [id number]  PCP:  [name], MD  Cardiologist:  Rayann Heman & Meda Coffee   No chief complaint on file.   History of Present Illness: Sonora Boller is a 87 y.o. female who presents today for a post hospital visit. Seen for Dr. Rayann Heman and Meda Coffee.   She has a history of known HTN, PAF - on Coumadin, diet-controlled diabetes mellitus type 2 and osteoporosis.   Presented recently with passing out at home after having a bowel movement. Not able to recollect much about the event. Lives alone but had company. Had right ankle fracture. Noted to be bradycardic and then with AF with RVR - had surgical fixation of her ankle - beta blocker was restarted at a reduced dose along with Coumadin. Aricept was stopped. To have 30 day outpatient monitor. Was sent to SNF post discharge.   Comes in today. Here with   Past Medical History:  Diagnosis Date  . Anxiety   . Borderline diabetes mellitus   . Cancer ([place]) 2020   breast  . Diverticulosis of colon   . DJD (degenerative joint disease)   . Dyslipidemia   . History of hematuria   . History of osteoporosis   . Hx of colonic polyps   . Hypertension   . Hypothyroid   . Macular degeneration    sees [name] for general eye care, sees [name] for retinal injections   . Memory loss   . Osteoporosis    took bisphosphonates, now off. Last DEXA in 2015 showed osteopenia   . Paroxysmal atrial fibrillation (HCC)    sees [name]   . SCCA (squamous cell carcinoma) of skin 12/11/2019   Left Hand Posterior (in situ)    Past Surgical History:  Procedure Laterality Date  . APPENDECTOMY  1947  . BREAST LUMPECTOMY WITH RADIOACTIVE SEED LOCALIZATION Right 05/14/2019   Procedure: RIGHT BREAST LUMPECTOMY WITH RADIOACTIVE SEED LOCALIZATION;  Surgeon: [name], MD;  Location: [place];  Service: General;  Laterality: Right;  .  CATARACT EXTRACTION W/PHACO Bilateral   . COLONOSCOPY  12/02/2014   per [name], severe diverticulosis but no polyps   . MASS EXCISION  04/17/2012   Procedure: EXCISION MASS;  Surgeon: [name], MD;  Location: [place];  Service: General;  Laterality: N/A;  . ORIF ANKLE FRACTURE Right 06/14/2020   Procedure: OPEN REDUCTION INTERNAL FIXATION (ORIF) ANKLE FRACTURE;  Surgeon: [name], MD;  Location: [place];  Service: Orthopedics;  Laterality: Right;  . THYROIDECTOMY  1990   [name]     Medications: No outpatient medications have been marked as taking for the 07/21/20 encounter (Appointment) with Burtis Junes, NP.     Allergies: Allergies  Allergen Reactions  . Epinephrine Palpitations    heart racing    Social History: The patient  reports that she quit smoking about 55 years ago. She has never used smokeless tobacco. She reports current alcohol use of about 3.0 standard drinks of alcohol per week. She reports that she does not use drugs.   Family History: The patient's ***family history includes Stroke in her mother.   Review of Systems: Please see the history of present illness.   All other systems are reviewed and negative.   Physical Exam: VS:  There were no vitals taken for this visit. [name]  BMI There is no  height or weight on file to calculate BMI.  Wt Readings from Last 3 Encounters:  07/19/20 137 lb 6.4 oz (62.3 kg)  07/06/20 148 lb 6.4 oz (67.3 kg)  06/12/20 145 lb (65.8 kg)    General: Pleasant. Well developed, well nourished and in no acute distress.   HEENT: Normal.  Neck: Supple, no JVD, carotid bruits, or masses noted.  Cardiac: ***Regular rate and rhythm. No murmurs, rubs, or gallops. No edema.  Respiratory:  Lungs are clear to auscultation bilaterally with normal work of breathing.  GI: Soft and nontender.  MS: No deformity or atrophy. Gait and ROM intact.  Skin: Warm and dry. Color is normal.  Neuro:  Strength and  sensation are intact and no gross focal deficits noted.  Psych: Alert, appropriate and with normal affect.   LABORATORY DATA:  EKG:  EKG {ACTION; IS/IS THY:[id number]} ordered today.  Personally reviewed by me. This demonstrates ***.  Lab Results  Component Value Date   WBC 11.9 (H) 06/19/2020   HGB 12.8 06/19/2020   HCT 37.4 06/19/2020   PLT 267 06/19/2020   GLUCOSE 173 (H) 06/11/2020   CHOL 196 02/05/2019   TRIG 186 (H) 02/05/2019   HDL 50 02/05/2019   LDLCALC 109 (H) 02/05/2019   ALT 22 05/10/2020   AST 21 05/10/2020   NA 140 06/11/2020   K 3.6 06/11/2020   CL 103 06/11/2020   CREATININE 0.94 06/11/2020   BUN 18 06/11/2020   CO2 23 06/11/2020   TSH 0.319 (L) 06/11/2020   INR 3.7 (A) 07/14/2020   HGBA1C 7.4 (H) 06/11/2020     BNP (last 3 results) No results for input(s): BNP in the last 8760 hours.  ProBNP (last 3 results) No results for input(s): PROBNP in the last 8760 hours.   Other Studies Reviewed Today:  ECHO IMPRESSIONS 06/2020  1. Left ventricular ejection fraction, by estimation, is 65 to 70%. The  left ventricle has normal function. The left ventricle has no regional  wall motion abnormalities. Left ventricular diastolic parameters are  indeterminate.  2. Right ventricular systolic function is normal. The right ventricular  size is normal. There is normal pulmonary artery systolic pressure.  3. The mitral valve is normal in structure. No evidence of mitral valve  regurgitation. No evidence of mitral stenosis.  4. The aortic valve is normal in structure. Aortic valve regurgitation is  trivial. No aortic stenosis is present.     Assessment/Plan:  Right ankle fracture -Per orthopedic surgery -Status post surgical fixation by Dr. Roda Shutters 12/6 -PT OT recommending SNF, nonweightbearing of right lower extremity and follow-up in 2 weeks with their service.   E. coli UTI , present on admission -De-escalate to Keflex.  Completed treatment   Paroxysmal  atrial fibrillation with bradycardia -Seen by cardiology reduced dose of metoprolol.  Continue Coumadin.  30-day monitor to be arranged by their service.   Essential hypertension -Continue HCTZ/cozaar    Type 2 diabetes, well controlled -Hemoglobin A1c 7.4 -Resume home regimen   Mild cognitive impairment -Discontinued Aricept for now due to bradycardia.  This can be resumed outpatient by her PCP if appropriate -Continue namenda    History of breast cancer -Continue anastrozole   Hypothyroidism -Mildly elevated free T4 1.13, low TSH 0.319. Repeat labs as outpatient in 4-6 weeks    1. Paroxysmal Atrial Fibrillation (ICD10:  I48.0) The patient's CHA2DS2-VASc score is 5, indicating a 7.2% annual risk of stroke.   Continue  warfarin  Pt had just a few  minutes of afib that she was unaware of I will not make any changes for now as she recently had syncope and do not feel comfortable  increasing BB Finish wearing event  monitor  Continue metoprolol tartrate at 12.5 mg bid (previously was on Toprol 100 mg daily, reduced with recent hospitalization for syncope)  2. Secondary Hypercoagulable State (ICD10:  D68.69) The patient is at significant risk for stroke/thromboembolism based upon her CHA2DS2-VASc Score of 5.  Continue Warfarin (Coumadin).   3. HTN Elevated,  on presentation recheck at  156/86 Current medicines are reviewed with the patient today.  The patient does not have concerns regarding medicines other than what has been noted above.  The following changes have been made:  See above.  Labs/ tests ordered today include:   No orders of the defined types were placed in this encounter.    Disposition:   FU with *** in {gen number [id number] {Days to years:10300}.   Patient is agreeable to this plan and will call if any problems develop in the interim.   SignedTruitt Merle, NP  07/20/2020 9:11 AM  [facility] [address] [place], [place]  [address] Phone: [phone] Fax: [phone]

## 2020-07-14 NOTE — Telephone Encounter (Signed)
Will also include Rudi Coco NP and covering RN in afib clinic on this message, for they follow and manage the pts afib. Will send this message to Lupita Leash as a general FYI.

## 2020-07-14 NOTE — Patient Instructions (Addendum)
Description   Hold warfarin today, then start taking warfarin 1 tablet daily( 2mg ) except for 1/2 a tablet (1 mg) on Wednesdays. Recheck INR in 9 days.  Continue taking Warfarin 1/2 tablet daily. Coumadin Clinic [phone].

## 2020-07-14 NOTE — Telephone Encounter (Signed)
Received 3 [employer] Serious Cardiac Event Monitor Reports: Day 22/30  07/13/20 Atrial Fibrillation RVR 0800pm 07/13/20 Atrial Fibrillation RVR 0742pm 07/13/20 Atrial Fibrillation RVR w/Artifact 0214pm  Spoke with patients son (DPR) who states patient was recently discharged from hospital and is currently staying with him in his home. He reports that these episodes occurred when patient was having a bowel movement. He reports that this is the only time that patient has episodes of RVR as far as he knows. Patient did get dizzy/lightheaded but recovered quickly. She is feeling fine today. No complaints at this time.  Patient has Coumadin Clinic Appt this afternoon. Advised patients son to call back with any new or worsening concerns.   Strips reviewed by DOD Dr. Johney Frame who recommended continuing to monitor, no changes at this time. Will send to patients primary cardiologist, Dr. Delton See for further review.

## 2020-07-15 ENCOUNTER — Telehealth: Payer: Self-pay | Admitting: Pharmacist

## 2020-07-15 ENCOUNTER — Telehealth: Payer: Self-pay | Admitting: Family Medicine

## 2020-07-15 DIAGNOSIS — M80071D Age-related osteoporosis with current pathological fracture, right ankle and foot, subsequent encounter for fracture with routine healing: Secondary | ICD-10-CM | POA: Diagnosis not present

## 2020-07-15 DIAGNOSIS — E46 Unspecified protein-calorie malnutrition: Secondary | ICD-10-CM | POA: Diagnosis not present

## 2020-07-15 DIAGNOSIS — E1151 Type 2 diabetes mellitus with diabetic peripheral angiopathy without gangrene: Secondary | ICD-10-CM | POA: Diagnosis not present

## 2020-07-15 DIAGNOSIS — R531 Weakness: Secondary | ICD-10-CM | POA: Diagnosis not present

## 2020-07-15 DIAGNOSIS — I480 Paroxysmal atrial fibrillation: Secondary | ICD-10-CM | POA: Diagnosis not present

## 2020-07-15 DIAGNOSIS — I10 Essential (primary) hypertension: Secondary | ICD-10-CM | POA: Diagnosis not present

## 2020-07-15 NOTE — Telephone Encounter (Signed)
Cynthia Mathis is calling and requesting verbal orders to continue Physical Therapy. Frequency 1 week 1 and 2 week 4, please advise. CB is [phone]

## 2020-07-15 NOTE — Telephone Encounter (Signed)
FYI: [facility] called and will be seeing patient in home weekly.  Gave orders for home health testing. Placed patient in INR book, next check due 07/24/19

## 2020-07-16 DIAGNOSIS — I10 Essential (primary) hypertension: Secondary | ICD-10-CM | POA: Diagnosis not present

## 2020-07-16 DIAGNOSIS — I480 Paroxysmal atrial fibrillation: Secondary | ICD-10-CM | POA: Diagnosis not present

## 2020-07-16 DIAGNOSIS — M80071D Age-related osteoporosis with current pathological fracture, right ankle and foot, subsequent encounter for fracture with routine healing: Secondary | ICD-10-CM | POA: Diagnosis not present

## 2020-07-16 DIAGNOSIS — E1151 Type 2 diabetes mellitus with diabetic peripheral angiopathy without gangrene: Secondary | ICD-10-CM | POA: Diagnosis not present

## 2020-07-16 DIAGNOSIS — E46 Unspecified protein-calorie malnutrition: Secondary | ICD-10-CM | POA: Diagnosis not present

## 2020-07-16 NOTE — Telephone Encounter (Signed)
Verbal orders given to David for PT 

## 2020-07-16 NOTE — Telephone Encounter (Signed)
Please okay this orders

## 2020-07-19 ENCOUNTER — Encounter: Payer: Self-pay | Admitting: Family Medicine

## 2020-07-19 ENCOUNTER — Other Ambulatory Visit: Payer: Self-pay

## 2020-07-19 ENCOUNTER — Ambulatory Visit (INDEPENDENT_AMBULATORY_CARE_PROVIDER_SITE_OTHER): Payer: Medicare Other | Admitting: Family Medicine

## 2020-07-19 VITALS — BP 118/80 | HR 67 | Temp 98.4°F | Wt 137.4 lb

## 2020-07-19 DIAGNOSIS — S82851G Displaced trimalleolar fracture of right lower leg, subsequent encounter for closed fracture with delayed healing: Secondary | ICD-10-CM | POA: Diagnosis not present

## 2020-07-19 DIAGNOSIS — G3184 Mild cognitive impairment, so stated: Secondary | ICD-10-CM

## 2020-07-19 DIAGNOSIS — I480 Paroxysmal atrial fibrillation: Secondary | ICD-10-CM | POA: Diagnosis not present

## 2020-07-19 DIAGNOSIS — I10 Essential (primary) hypertension: Secondary | ICD-10-CM

## 2020-07-19 DIAGNOSIS — Z23 Encounter for immunization: Secondary | ICD-10-CM | POA: Diagnosis not present

## 2020-07-19 DIAGNOSIS — R55 Syncope and collapse: Secondary | ICD-10-CM

## 2020-07-19 MED ORDER — METOPROLOL TARTRATE 25 MG PO TABS: 12.5000 mg | ORAL_TABLET | Freq: Two times a day (BID) | ORAL | 11 refills | Status: AC

## 2020-07-19 NOTE — Addendum Note (Signed)
Addended by: Matilde Sprang on: 07/19/2020 01:12 PM   Modules accepted: Orders

## 2020-07-19 NOTE — Progress Notes (Signed)
   Subjective:    Patient ID: Cynthia Mathis, female    DOB: 20-Sep-1932, 87 y.o.   MRN: [id number]  HPI Here with her son [name] to follow up a hospital stay from 06-10-20 to 06-19-20 and then a rehab stay at [facility] facility until she got home on 07-09-20. On the day of admission she had used the bathroom at home (had a BM) and then when she got up she apparently passed out and fell. When she regained consciousness she had pain in the right ankle and was taken to the ER. She was diagnosed with a bimalleolar fracture of the right ankle and this was surgically repaired by Dr. Erlinda Hong. No definitive explanation for her syncope was found, although she does have paroxysmal atrial fibrillation. I think the most likely etiology was a vasovagal event. She has been wearing a 30 day cardiac monitor and this will be removed later this week. She saw Cardiology on 07-06-20, and they were satisfied with her progress. She is getting PT for the ankle and she is wearing a boot. She uses a walker and is not yet bearing weight on that foot. Otherwise she feels well. She has no chest pain or palpitations or SOB. Her children ask if some of her medications can be stopped, including the glucosamine and the fish oil. Also her Arocept was stopped during the rehab stay, and thy ask if the Memantine can be stopped as well. Neither of these has seemed to make much difference for her. When she got home from [facility], the Metoprolol tartrate had been reduced to once a day for some reason.    Review of Systems  Constitutional: Negative.   Respiratory: Negative.   Cardiovascular: Negative.   Musculoskeletal: Positive for gait problem.       Objective:   Physical Exam Constitutional:      Appearance: Normal appearance.     Comments: In a wheelchair   Cardiovascular:     Rate and Rhythm: Normal rate and regular rhythm.     Pulses: Normal pulses.     Heart sounds: Normal heart sounds.  Pulmonary:     Effort: Pulmonary  effort is normal.     Breath sounds: Normal breath sounds.  Musculoskeletal:     Right lower leg: No edema.     Left lower leg: No edema.  Neurological:     General: No focal deficit present.     Mental Status: She is alert and oriented to person, place, and time.           Assessment & Plan:  She is recovering from an ankle fracture and she will follow up with Dr. Erlinda Hong as scheduled. Her atrial fibrillation seems to be well controlled, but we will await the final report from the event monitor. Her BP is stable. We will increase the Metoprolol tartrate 12. 5mg  back to BID. We agreed to stop the fish oil, glucosamine, and Memantine as above. Given a flu shot. [name], MD

## 2020-07-20 ENCOUNTER — Telehealth: Payer: Self-pay | Admitting: *Deleted

## 2020-07-20 NOTE — Telephone Encounter (Signed)
Received cardiac report day 29 - critical 07/20/20 at 12:55 am CST Auto triggered SBw run of VT (15 beats)  Spoke with patient's daughter in law and patient.  She was in bed and most likely asleep at the time and does not recall feeling this.  Pt wearing monitor due to episode of syncope, ordered upon dc from [facility] on 06/16/20.  Reviewed by DOD- Dr. Curt Bears.  No new orders, continue to monitor.

## 2020-07-21 ENCOUNTER — Ambulatory Visit: Payer: Medicare Other | Admitting: Nurse Practitioner

## 2020-07-23 ENCOUNTER — Ambulatory Visit (INDEPENDENT_AMBULATORY_CARE_PROVIDER_SITE_OTHER): Payer: Medicare Other

## 2020-07-23 DIAGNOSIS — I480 Paroxysmal atrial fibrillation: Secondary | ICD-10-CM | POA: Diagnosis not present

## 2020-07-23 DIAGNOSIS — Z7901 Long term (current) use of anticoagulants: Secondary | ICD-10-CM | POA: Insufficient documentation

## 2020-07-23 DIAGNOSIS — Z5181 Encounter for therapeutic drug level monitoring: Secondary | ICD-10-CM

## 2020-07-23 DIAGNOSIS — Z87440 Personal history of urinary (tract) infections: Secondary | ICD-10-CM

## 2020-07-23 DIAGNOSIS — E039 Hypothyroidism, unspecified: Secondary | ICD-10-CM | POA: Diagnosis not present

## 2020-07-23 DIAGNOSIS — Z79899 Other long term (current) drug therapy: Secondary | ICD-10-CM

## 2020-07-23 DIAGNOSIS — I10 Essential (primary) hypertension: Secondary | ICD-10-CM | POA: Diagnosis not present

## 2020-07-23 DIAGNOSIS — M1990 Unspecified osteoarthritis, unspecified site: Secondary | ICD-10-CM | POA: Diagnosis not present

## 2020-07-23 DIAGNOSIS — M80071D Age-related osteoporosis with current pathological fracture, right ankle and foot, subsequent encounter for fracture with routine healing: Secondary | ICD-10-CM | POA: Diagnosis not present

## 2020-07-23 DIAGNOSIS — Z853 Personal history of malignant neoplasm of breast: Secondary | ICD-10-CM

## 2020-07-23 DIAGNOSIS — E538 Deficiency of other specified B group vitamins: Secondary | ICD-10-CM | POA: Diagnosis not present

## 2020-07-23 DIAGNOSIS — G309 Alzheimer's disease, unspecified: Secondary | ICD-10-CM | POA: Diagnosis not present

## 2020-07-23 DIAGNOSIS — Z9181 History of falling: Secondary | ICD-10-CM

## 2020-07-23 DIAGNOSIS — K5900 Constipation, unspecified: Secondary | ICD-10-CM | POA: Diagnosis not present

## 2020-07-23 DIAGNOSIS — H3530 Unspecified macular degeneration: Secondary | ICD-10-CM | POA: Diagnosis not present

## 2020-07-23 DIAGNOSIS — F0280 Dementia in other diseases classified elsewhere without behavioral disturbance: Secondary | ICD-10-CM

## 2020-07-23 DIAGNOSIS — E1151 Type 2 diabetes mellitus with diabetic peripheral angiopathy without gangrene: Secondary | ICD-10-CM | POA: Diagnosis not present

## 2020-07-23 DIAGNOSIS — E46 Unspecified protein-calorie malnutrition: Secondary | ICD-10-CM | POA: Diagnosis not present

## 2020-07-23 LAB — POCT INR: INR: 1.9 — AB (ref 2.0–3.0)

## 2020-07-23 NOTE — Patient Instructions (Signed)
Description   Spoke with [facility] RN while in pt's home, advised to have pt take 1.5 tablets (3mg ) today, then start taking warfarin 1 tablet (2mg ) daily.  Recheck INR in 10 days.  Coumadin Clinic [phone].

## 2020-07-27 DIAGNOSIS — E46 Unspecified protein-calorie malnutrition: Secondary | ICD-10-CM | POA: Diagnosis not present

## 2020-07-27 DIAGNOSIS — M80071D Age-related osteoporosis with current pathological fracture, right ankle and foot, subsequent encounter for fracture with routine healing: Secondary | ICD-10-CM | POA: Diagnosis not present

## 2020-07-27 DIAGNOSIS — I10 Essential (primary) hypertension: Secondary | ICD-10-CM | POA: Diagnosis not present

## 2020-07-27 DIAGNOSIS — E1151 Type 2 diabetes mellitus with diabetic peripheral angiopathy without gangrene: Secondary | ICD-10-CM | POA: Diagnosis not present

## 2020-07-27 DIAGNOSIS — I480 Paroxysmal atrial fibrillation: Secondary | ICD-10-CM | POA: Diagnosis not present

## 2020-07-28 ENCOUNTER — Ambulatory Visit (INDEPENDENT_AMBULATORY_CARE_PROVIDER_SITE_OTHER): Payer: Medicare Other

## 2020-07-28 ENCOUNTER — Other Ambulatory Visit: Payer: Self-pay

## 2020-07-28 ENCOUNTER — Ambulatory Visit (INDEPENDENT_AMBULATORY_CARE_PROVIDER_SITE_OTHER): Payer: Medicare Other | Admitting: Orthopaedic Surgery

## 2020-07-28 DIAGNOSIS — M25571 Pain in right ankle and joints of right foot: Secondary | ICD-10-CM

## 2020-07-28 DIAGNOSIS — S82851G Displaced trimalleolar fracture of right lower leg, subsequent encounter for closed fracture with delayed healing: Secondary | ICD-10-CM | POA: Diagnosis not present

## 2020-07-28 DIAGNOSIS — M25671 Stiffness of right ankle, not elsewhere classified: Secondary | ICD-10-CM | POA: Diagnosis not present

## 2020-07-28 NOTE — Progress Notes (Signed)
Post-Op Visit Note   Patient: Cynthia Mathis           Date of Birth: 12-11-1932           MRN: [id number] Visit Date: 07/28/2020 PCP: [name], MD   Assessment & Plan:  Chief Complaint:  Chief Complaint  Patient presents with  . Right Ankle - Pain, Routine Post Op, Follow-up   Visit Diagnoses:  1. Pain in right ankle and joints of right foot   2. Closed displaced trimalleolar fracture of right ankle with delayed healing, subsequent encounter     Plan:   Patient is 6 weeks status post ORIF right trimalleolar ankle fracture.  She is doing well overall and reports no pain.  She is receiving home health PT twice a week.  She has been nonweightbearing so far.  Examination of the right ankle shows fully healed surgical scars.  Minimal swelling.  No signs of infection.  Range of motion of the ankle is actually progressing well.  X-rays demonstrate stable fixation and continued healing of the fractures.  At this point we will advance her to weight-bear as tolerated.  ASO brace provided to wear at nighttime and to transition into from the cam boot.  Instructions for home health PT updated today.  She will need continued home health PT for ankle rehab.  Recheck in 6 weeks with three-view x-rays of the right ankle.  Follow-Up Instructions: Return in about 6 weeks (around 09/08/2020).   Orders:  Orders Placed This Encounter  Procedures  . XR Ankle Complete Right   No orders of the defined types were placed in this encounter.   Imaging: XR Ankle Complete Right  Result Date: 07/28/2020 Stable fixation and continued healing of ankle fracture   PMFS History: Patient Active Problem List   Diagnosis Date Noted  . Long term (current) use of anticoagulants 07/23/2020  . Closed displaced trimalleolar fracture of right ankle 06/10/2020  . Leukocytosis 06/10/2020  . Bradycardia 06/10/2020  . Syncope 06/10/2020  . Aortic atherosclerosis ([place]) 05/10/2020  . Secondary  hypercoagulable state ([place]) 04/23/2020  . Posterior vitreous detachment of right eye 12/02/2019  . Exudative age-related macular degeneration of left eye with active choroidal neovascularization ([place]) 10/28/2019  . Exudative age-related macular degeneration of right eye with active choroidal neovascularization ([place]) 10/28/2019  . Retinal hemorrhage of left eye 10/28/2019  . Malignant neoplasm of upper-outer quadrant of right breast in female, estrogen receptor positive ([place]) 03/26/2019  . Mild cognitive impairment 03/28/2018  . Foot pain, right 12/05/2017  . Hx of colonic polyps   . Diverticulosis of colon   . Anxiety   . Paroxysmal atrial fibrillation ([place]) 08/24/2016  . Low back pain 05/06/2015  . Osteopenia 08/04/2013  . Memory loss 09/17/2012  . 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 Macular degeneration (senile) of retina 08/12/2008  . Osteoarthritis 08/12/2008  . Impaired fasting glucose 02/10/2008  . Hypothyroidism 08/08/2007  . Dyslipidemia 08/08/2007  . Essential hypertension 08/08/2007   Past Medical History:  Diagnosis Date  . Anxiety   . Borderline diabetes mellitus   . Cancer ([place]) 2020   breast  . Diverticulosis of colon   . DJD (degenerative joint disease)   . Dyslipidemia   . History of hematuria   . History of osteoporosis   . Hx of colonic polyps   . Hypertension   . Hypothyroid   . Macular degeneration    sees [name] for general eye care, sees [name] for retinal injections   .  Memory loss   . Osteoporosis    took bisphosphonates, now off. Last DEXA in 2015 showed osteopenia   . Paroxysmal atrial fibrillation (HCC)    sees [name]   . SCCA (squamous cell carcinoma) of skin 12/11/2019   Left Hand Posterior (in situ)    Family History  Problem Relation Age of Onset  . Stroke Mother   . Colon cancer Neg Hx   . Stomach cancer Neg Hx     Past Surgical History:  Procedure Laterality Date  . APPENDECTOMY  1947  . BREAST LUMPECTOMY  WITH RADIOACTIVE SEED LOCALIZATION Right 05/14/2019   Procedure: RIGHT BREAST LUMPECTOMY WITH RADIOACTIVE SEED LOCALIZATION;  Surgeon: [name], MD;  Location: [place];  Service: General;  Laterality: Right;  . CATARACT EXTRACTION W/PHACO Bilateral   . COLONOSCOPY  12/02/2014   per [name], severe diverticulosis but no polyps   . MASS EXCISION  04/17/2012   Procedure: EXCISION MASS;  Surgeon: [name], MD;  Location: [place];  Service: General;  Laterality: N/A;  . ORIF ANKLE FRACTURE Right 06/14/2020   Procedure: OPEN REDUCTION INTERNAL FIXATION (ORIF) ANKLE FRACTURE;  Surgeon: [name], MD;  Location: [place];  Service: Orthopedics;  Laterality: Right;  . THYROIDECTOMY  1990   [name]   Social History   Occupational History  . Occupation: Education officer, environmental: RETIRED  Tobacco Use  . Smoking status: Former Smoker    Quit date: 07/10/1965    Years since quitting: 55.0  . Smokeless tobacco: Never Used  . Tobacco comment: 3 cigs per week x 3 years (started in 1964), 12/27/15 does not smoke  Vaping Use  . Vaping Use: Never used  Substance and Sexual Activity  . Alcohol use: Yes    Alcohol/week: 3.0 standard drinks    Types: 3 Glasses of wine per week    Comment: occasionally  . Drug use: No  . Sexual activity: Not on file

## 2020-07-29 ENCOUNTER — Telehealth: Payer: Self-pay

## 2020-07-29 DIAGNOSIS — I480 Paroxysmal atrial fibrillation: Secondary | ICD-10-CM | POA: Diagnosis not present

## 2020-07-29 DIAGNOSIS — M80071D Age-related osteoporosis with current pathological fracture, right ankle and foot, subsequent encounter for fracture with routine healing: Secondary | ICD-10-CM | POA: Diagnosis not present

## 2020-07-29 DIAGNOSIS — E46 Unspecified protein-calorie malnutrition: Secondary | ICD-10-CM | POA: Diagnosis not present

## 2020-07-29 DIAGNOSIS — E1151 Type 2 diabetes mellitus with diabetic peripheral angiopathy without gangrene: Secondary | ICD-10-CM | POA: Diagnosis not present

## 2020-07-29 DIAGNOSIS — I10 Essential (primary) hypertension: Secondary | ICD-10-CM | POA: Diagnosis not present

## 2020-07-29 NOTE — Telephone Encounter (Signed)
Is pt weight bearing tolerated with the boot and the ankle brace.   [phone]- Dionka

## 2020-07-29 NOTE — Telephone Encounter (Signed)
yes

## 2020-07-30 NOTE — Telephone Encounter (Signed)
Do we have a complete number for Dionka?  See message below.

## 2020-08-02 ENCOUNTER — Ambulatory Visit (INDEPENDENT_AMBULATORY_CARE_PROVIDER_SITE_OTHER): Payer: Medicare Other | Admitting: Interventional Cardiology

## 2020-08-02 DIAGNOSIS — E46 Unspecified protein-calorie malnutrition: Secondary | ICD-10-CM | POA: Diagnosis not present

## 2020-08-02 DIAGNOSIS — M80071D Age-related osteoporosis with current pathological fracture, right ankle and foot, subsequent encounter for fracture with routine healing: Secondary | ICD-10-CM | POA: Diagnosis not present

## 2020-08-02 DIAGNOSIS — Z5181 Encounter for therapeutic drug level monitoring: Secondary | ICD-10-CM | POA: Diagnosis not present

## 2020-08-02 DIAGNOSIS — I10 Essential (primary) hypertension: Secondary | ICD-10-CM | POA: Diagnosis not present

## 2020-08-02 DIAGNOSIS — E1151 Type 2 diabetes mellitus with diabetic peripheral angiopathy without gangrene: Secondary | ICD-10-CM | POA: Diagnosis not present

## 2020-08-02 DIAGNOSIS — I480 Paroxysmal atrial fibrillation: Secondary | ICD-10-CM | POA: Diagnosis not present

## 2020-08-02 LAB — POCT INR: INR: 3 (ref 2.0–3.0)

## 2020-08-02 NOTE — Patient Instructions (Signed)
Description   Spoke with [facility] RN while in pt's home to continue taking warfarin 1 tablet (2mg ) daily.  Recheck INR in 10 days- by home health.  Coumadin Clinic [phone].

## 2020-08-03 DIAGNOSIS — I480 Paroxysmal atrial fibrillation: Secondary | ICD-10-CM | POA: Diagnosis not present

## 2020-08-03 DIAGNOSIS — E1151 Type 2 diabetes mellitus with diabetic peripheral angiopathy without gangrene: Secondary | ICD-10-CM | POA: Diagnosis not present

## 2020-08-03 DIAGNOSIS — M80071D Age-related osteoporosis with current pathological fracture, right ankle and foot, subsequent encounter for fracture with routine healing: Secondary | ICD-10-CM | POA: Diagnosis not present

## 2020-08-03 DIAGNOSIS — E46 Unspecified protein-calorie malnutrition: Secondary | ICD-10-CM | POA: Diagnosis not present

## 2020-08-03 DIAGNOSIS — I10 Essential (primary) hypertension: Secondary | ICD-10-CM | POA: Diagnosis not present

## 2020-08-03 NOTE — Telephone Encounter (Signed)
Dionka with [facility] called back to check wether the pt is weight bearing and she stated we could call the pt to make them aware of the answer    Don[facility] CB# [phone]

## 2020-08-03 NOTE — Telephone Encounter (Signed)
Called patient to advise no answer LMOM with details below.

## 2020-08-05 ENCOUNTER — Telehealth: Payer: Self-pay | Admitting: Family Medicine

## 2020-08-05 DIAGNOSIS — E46 Unspecified protein-calorie malnutrition: Secondary | ICD-10-CM | POA: Diagnosis not present

## 2020-08-05 DIAGNOSIS — I480 Paroxysmal atrial fibrillation: Secondary | ICD-10-CM | POA: Diagnosis not present

## 2020-08-05 DIAGNOSIS — M80071D Age-related osteoporosis with current pathological fracture, right ankle and foot, subsequent encounter for fracture with routine healing: Secondary | ICD-10-CM | POA: Diagnosis not present

## 2020-08-05 DIAGNOSIS — I10 Essential (primary) hypertension: Secondary | ICD-10-CM | POA: Diagnosis not present

## 2020-08-05 DIAGNOSIS — E1151 Type 2 diabetes mellitus with diabetic peripheral angiopathy without gangrene: Secondary | ICD-10-CM | POA: Diagnosis not present

## 2020-08-05 NOTE — Telephone Encounter (Signed)
[place]  Shanon Brow wanted to let Dr. Sarajane Jews know that the patient has been staying with her son since she broke her foot and she would like to go back to her apartment.  Shanon Brow was requesting a medical social worker to explore resources in the community.  Please advise

## 2020-08-05 NOTE — Telephone Encounter (Addendum)
Dionka called asking if pt is just weight bearing with the [place] or the ankle brace ?   Dionka # is [phone]

## 2020-08-05 NOTE — Telephone Encounter (Signed)
WBAT in both.

## 2020-08-06 NOTE — Telephone Encounter (Signed)
Good idea. Please call in a consult for Social Work

## 2020-08-06 NOTE — Telephone Encounter (Signed)
I called and advised the per dr. Erlinda Hong pt is stable enough to just WBAT in brace

## 2020-08-06 NOTE — Telephone Encounter (Signed)
Spoke with Shanon Brow, verbal order given

## 2020-08-10 DIAGNOSIS — M80071D Age-related osteoporosis with current pathological fracture, right ankle and foot, subsequent encounter for fracture with routine healing: Secondary | ICD-10-CM | POA: Diagnosis not present

## 2020-08-10 DIAGNOSIS — E46 Unspecified protein-calorie malnutrition: Secondary | ICD-10-CM | POA: Diagnosis not present

## 2020-08-10 DIAGNOSIS — E1151 Type 2 diabetes mellitus with diabetic peripheral angiopathy without gangrene: Secondary | ICD-10-CM | POA: Diagnosis not present

## 2020-08-10 DIAGNOSIS — I480 Paroxysmal atrial fibrillation: Secondary | ICD-10-CM | POA: Diagnosis not present

## 2020-08-10 DIAGNOSIS — I10 Essential (primary) hypertension: Secondary | ICD-10-CM | POA: Diagnosis not present

## 2020-08-11 ENCOUNTER — Encounter: Payer: Self-pay | Admitting: Physician Assistant

## 2020-08-11 DIAGNOSIS — E46 Unspecified protein-calorie malnutrition: Secondary | ICD-10-CM | POA: Diagnosis not present

## 2020-08-11 DIAGNOSIS — I480 Paroxysmal atrial fibrillation: Secondary | ICD-10-CM | POA: Diagnosis not present

## 2020-08-11 DIAGNOSIS — M80071D Age-related osteoporosis with current pathological fracture, right ankle and foot, subsequent encounter for fracture with routine healing: Secondary | ICD-10-CM | POA: Diagnosis not present

## 2020-08-11 DIAGNOSIS — I10 Essential (primary) hypertension: Secondary | ICD-10-CM | POA: Diagnosis not present

## 2020-08-11 DIAGNOSIS — E1151 Type 2 diabetes mellitus with diabetic peripheral angiopathy without gangrene: Secondary | ICD-10-CM | POA: Diagnosis not present

## 2020-08-11 NOTE — Progress Notes (Signed)
Cardiology Office Note    Date:  08/13/2020   ID:  [name], DOB [birth year], MRN [id number]  PCP:  [name], MD  Cardiologist:  Ena Dawley, MD  Electrophysiologist:  None   Chief Complaint: f/u event monitor  History of Present Illness:   Cynthia Mathis is a 87 y.o. female with history of DM, HTN, HLD, PAF, NSVT (by recent event monitor), anxiety, breast CA, hypothyroidism, macular degeneration, memory loss, osteoporosis who is seen for follow-up. She was initially diagnosed with atrial fib in 2017. She did well for many years and then had recurrence in 04/2020, followed by the afib clinic. She was in the hospital in December 2021 for a possible episode of syncope and broke her right foot for the fall. She was at home and had a BM. She stood up from the toilet and began to try and leave the bathroom but did not recall how she landed on the floor. She was unsure if she had passed out. Her BB was greatly reduced for bradycardia while in the hospital from 100mg  daily to Lopressor 12.5mg  BID. 2D Echo showed normal EF and was otherwise reassuring. She subsequently had an event monitor done that showed predominantly NSR, minimum HR 47, max HR 101, 8% afib burden (85-144bpm), no pauses of AV blocks, several short runs of NSVT longest lasting 15 beats. Dr. Meda Coffee recommended stress test. There were challenges getting in touch with her because she is now staying with her son and his family instead.  She is back today with her son [name] and they feel she is doing well. She has not had any recurrent syncope. No dizziness, SOB, palpitations or chest pain. She is tolerating current meds well. [name] shares that about 4 months prior to the December episode of syncope, she'd had a similar spell when she was in a nursing home that occurred standing in the bathroom.   Labwork independently reviewed: 06/2020 WBC 11.9, Hgb 12.8, Ft4 ?1.13 with suppressed TSH, A1c 7.4, K 3.6, Cr 0.94 09/2019  LFTs ok 2020 LDL 109  Past Medical History:  Diagnosis Date  . Anxiety   . Borderline diabetes mellitus   . Cancer ([facility]) 2020   breast  . Diverticulosis of colon   . DJD (degenerative joint disease)   . Dyslipidemia   . History of hematuria   . History of osteoporosis   . Hx of colonic polyps   . Hypertension   . Hypothyroid   . Macular degeneration    sees [name] for general eye care, sees [name] for retinal injections   . Memory loss   . NSVT (nonsustained ventricular tachycardia) ([facility])   . Osteoporosis    took bisphosphonates, now off. Last DEXA in 2015 showed osteopenia   . Paroxysmal atrial fibrillation ([facility])    sees [name]   . SCCA (squamous cell carcinoma) of skin 12/11/2019   Left Hand Posterior (in situ)    Past Surgical History:  Procedure Laterality Date  . APPENDECTOMY  1947  . BREAST LUMPECTOMY WITH RADIOACTIVE SEED LOCALIZATION Right 05/14/2019   Procedure: RIGHT BREAST LUMPECTOMY WITH RADIOACTIVE SEED LOCALIZATION;  Surgeon: [name], MD;  Location: [facility];  Service: General;  Laterality: Right;  . CATARACT EXTRACTION W/PHACO Bilateral   . COLONOSCOPY  12/02/2014   per [name], severe diverticulosis but no polyps   . MASS EXCISION  04/17/2012   Procedure: EXCISION MASS;  Surgeon: [name], MD;  Location: [facility];  Service: General;  Laterality: N/A;  . ORIF ANKLE FRACTURE Right 06/14/2020   Procedure: OPEN REDUCTION INTERNAL FIXATION (ORIF) ANKLE FRACTURE;  Surgeon: [name], MD;  Location: [facility];  Service: Orthopedics;  Laterality: Right;  . THYROIDECTOMY  1990   [name]    Current Medications: Current Meds  Medication Sig  . anastrozole (ARIMIDEX) 1 MG tablet Take 1 tablet (1 mg total) by mouth daily.  . Calcium Carbonate-Vitamin D 600-400 MG-UNIT tablet Take 1 tablet by mouth daily.  . Cyanocobalamin (VITAMIN B 12) 500 MCG TABS Take 1,000 mcg by mouth daily.   [name] diltiazem (CARDIZEM) 30  MG tablet Take 1 tablet every 4 hours AS NEEDED for afib heart rate >100 as long as top BP >100.  . hydrochlorothiazide (HYDRODIURIL) 25 MG tablet Take 25 mg by mouth daily.  [name] losartan (COZAAR) 100 MG tablet Take 100 mg by mouth daily.  . metoprolol tartrate (LOPRESSOR) 25 MG tablet Take 0.5 tablets (12.5 mg total) by mouth 2 (two) times daily.  . Multiple Vitamins-Minerals (MULTIVITAMIN & MINERAL PO) Take 1 tablet by mouth daily.  . Psyllium (METAMUCIL PO) Take by mouth as needed.  . warfarin (COUMADIN) 2 MG tablet Take 1/2 a tablet to 1 tablet by mouth daily as directed by the coumadin clinic.   Allergies:   Epinephrine   Social History   Socioeconomic History  . Marital status: Widowed    Spouse name: Mariane Masters Lampton  . Number of children: 3  . Years of education: [employer]  . Highest education level: Not on file  Occupational History  . Occupation: Education officer, environmental: RETIRED  Tobacco Use  . Smoking status: Former Smoker    Quit date: 07/10/1965    Years since quitting: 55.1  . Smokeless tobacco: Never Used  . Tobacco comment: 3 cigs per week x 3 years (started in 1964), 12/27/15 does not smoke  Vaping Use  . Vaping Use: Never used  Substance and Sexual Activity  . Alcohol use: Yes    Alcohol/week: 3.0 standard drinks    Types: 3 Glasses of wine per week    Comment: occasionally  . Drug use: No  . Sexual activity: Not on file  Other Topics Concern  . Not on file  Social History Narrative   03/26/19 Pt lives at home alone now, widow.   her spouse passed Dec 2019 Mariane Masters Faron, who has been dx'd with mild cognitive impairment vs. mild dementia, former pt of [name]). She does not use caffeine, if so, rarely.   Social Determinants of Health   Financial Resource Strain: Low Risk   . Difficulty of Paying Living Expenses: Not hard at all  Food Insecurity: Not on file  Transportation Needs: No Transportation Needs  . Lack of Transportation (Medical): No  . Lack of  Transportation (Non-Medical): No  Physical Activity: Not on file  Stress: Not on file  Social Connections: Not on file     Family History:  The patient's family history includes Stroke in her mother. There is no history of Colon cancer or Stomach cancer.  ROS:   Please see the history of present illness. All other systems are reviewed and otherwise negative.    EKGs/Labs/Other Studies Reviewed:    Studies reviewed are outlined and summarized above. Reports included below if pertinent.  2D echo 06/11/20  1. Left ventricular ejection fraction, by estimation, is 65 to 70%. The  left ventricle has normal function. The left ventricle  has no regional  wall motion abnormalities. Left ventricular diastolic parameters are  indeterminate.  2. Right ventricular systolic function is normal. The right ventricular  size is normal. There is normal pulmonary artery systolic pressure.  3. The mitral valve is normal in structure. No evidence of mitral valve  regurgitation. No evidence of mitral stenosis.  4. The aortic valve is normal in structure. Aortic valve regurgitation is  trivial. No aortic stenosis is present.   Event monitor 07/2020  Predominant rhythm was sinus rhythm with minimum HR 47 and maximum HR of 101.  Atrial fibrillation was present for 8% of monitoring time with HR variability 85-144 BPM.  No pauses or AV blocks.  Several short runs of nsVT occured, the longest lasting 15 beats.   Predominant rhythm was sinus rhythm with minimum HR 47 and maximum HR of 101. Atrial fibrillation was present for 8% of monitoring time. Several short runs of nsVT occured, the longest lasting 15 beats. Symptoms didn't correlate with any arrhythmias.     EKG:  EKG is not ordered today  Recent Labs: 05/10/2020: ALT 22 06/11/2020: BUN 18; Creatinine, Ser 0.94; Potassium 3.6; Sodium 140; TSH 0.319 06/19/2020: Hemoglobin 12.8; Platelets 267  Recent Lipid Panel    Component Value Date/Time    CHOL 196 02/05/2019 0852   TRIG 186 (H) 02/05/2019 0852   HDL 50 02/05/2019 0852   CHOLHDL 3.9 02/05/2019 0852   CHOLHDL 4 02/13/2017 0905   VLDL 31.4 02/13/2017 0905   LDLCALC 109 (H) 02/05/2019 0852    PHYSICAL EXAM:    VS:  BP 122/76   Pulse 71   Ht 5\' 3"  (1.6 m)   Wt 145 lb 12.8 oz (66.1 kg)   SpO2 98%   BMI 25.83 kg/m   BMI: Body mass index is 25.83 kg/m.  GEN: Well nourished, well developed WF, in no acute distress HEENT: normocephalic, atraumatic Neck: no JVD, carotid bruits, or masses Cardiac: RRR; no murmurs, rubs, or gallops, no edema  Respiratory:  clear to auscultation bilaterally, normal work of breathing GI: soft, nontender, nondistended, + BS MS: no deformity or atrophy Skin: warm and dry, no rash Neuro:  Alert and Oriented x 3, Strength and sensation are intact, follows commands Psych: euthymic mood, full affect  Wt Readings from Last 3 Encounters:  08/13/20 145 lb 12.8 oz (66.1 kg)  07/19/20 137 lb 6.4 oz (62.3 kg)  07/06/20 148 lb 6.4 oz (67.3 kg)     ASSESSMENT & PLAN:   1. Syncope follow-up - no recurrent events. Echo and monitor reviewed as above. Event monitor showed brief runs NSVT for which Dr. Meda Coffee recommended stress test. Discussed this with patient today. She conferred with her son and per their collective discussion have decided she should proceed. At time of original event in December it was thought this was the first episode of syncope. However, her son shares today that she'd had a similar episode about 4 months before that. Pertinent changes since that time include reduction in her beta blocker. In light of monitor findings and 2 prior episodes, I will review with Dr. Meda Coffee to find out if there is any need for EP referral. She is not currently driving. I relayed [place] recommendation to avoid driving for 6 months after syncopal episode. Furthermore with her memory impairment she is not a great candidate to resume anyway. This was reviewed in  the visit today. She is now staying with her family who provides good support.  Shared Decision Making/Informed Consent  The risks [chest pain, shortness of breath, cardiac arrhythmias, dizziness, blood pressure fluctuations, myocardial infarction, stroke/transient ischemic attack, nausea, vomiting, allergic reaction, radiation exposure, metallic taste sensation and life-threatening complications (estimated to be 1 in 10,000)], benefits (risk stratification, diagnosing coronary artery disease, treatment guidance) and alternatives of a nuclear stress test were discussed in detail with Ms. An and her son and they both agree she wants to proceed.  2. NSVT - 2D echo recently showed normal LV function. Dr. Meda Coffee recommended nuclear stress test to further evaluate this. See above. Will also check lytes.  3. PAF (also with history of hypothyroidism) - she is on warfarin for anticoagulation as Eliquis was too expensive previously. Cost options can be explored by our Coumadin clinic in the future if the patient desires to revisit. She was also noted to be mildly hyperthyroid in the hospital in December. We'll recheck today. Reports remote h/o goiter removal 40 years ago.  4. Essential HTN - controlled on present regimen.  Disposition: F/u with me or APP in 6 weeks to review stress test result.  Medication Adjustments/Labs and Tests Ordered: Current medicines are reviewed at length with the patient today.  Concerns regarding medicines are outlined above. Medication changes, Labs and Tests ordered today are summarized above and listed in the Patient Instructions accessible in Encounters.   Signed, Charlie Pitter, PA-C  08/13/2020 1:53 PM    [facility] [address], [place], [place]  [address] Phone: [phone]; Fax: [phone]

## 2020-08-12 DIAGNOSIS — I10 Essential (primary) hypertension: Secondary | ICD-10-CM | POA: Diagnosis not present

## 2020-08-12 DIAGNOSIS — E46 Unspecified protein-calorie malnutrition: Secondary | ICD-10-CM | POA: Diagnosis not present

## 2020-08-12 DIAGNOSIS — M80071D Age-related osteoporosis with current pathological fracture, right ankle and foot, subsequent encounter for fracture with routine healing: Secondary | ICD-10-CM | POA: Diagnosis not present

## 2020-08-12 DIAGNOSIS — E1151 Type 2 diabetes mellitus with diabetic peripheral angiopathy without gangrene: Secondary | ICD-10-CM | POA: Diagnosis not present

## 2020-08-12 DIAGNOSIS — I480 Paroxysmal atrial fibrillation: Secondary | ICD-10-CM | POA: Diagnosis not present

## 2020-08-13 ENCOUNTER — Telehealth: Payer: Self-pay | Admitting: Family Medicine

## 2020-08-13 ENCOUNTER — Telehealth: Payer: Self-pay | Admitting: Physician Assistant

## 2020-08-13 ENCOUNTER — Encounter: Payer: Self-pay | Admitting: Physician Assistant

## 2020-08-13 ENCOUNTER — Other Ambulatory Visit: Payer: Self-pay

## 2020-08-13 ENCOUNTER — Encounter: Payer: Self-pay | Admitting: *Deleted

## 2020-08-13 ENCOUNTER — Ambulatory Visit (INDEPENDENT_AMBULATORY_CARE_PROVIDER_SITE_OTHER): Payer: Medicare Other | Admitting: Physician Assistant

## 2020-08-13 ENCOUNTER — Ambulatory Visit (INDEPENDENT_AMBULATORY_CARE_PROVIDER_SITE_OTHER): Payer: Self-pay | Admitting: *Deleted

## 2020-08-13 VITALS — BP 122/76 | HR 71 | Ht 63.0 in | Wt 145.8 lb

## 2020-08-13 DIAGNOSIS — I472 Ventricular tachycardia: Secondary | ICD-10-CM

## 2020-08-13 DIAGNOSIS — I480 Paroxysmal atrial fibrillation: Secondary | ICD-10-CM

## 2020-08-13 DIAGNOSIS — R9431 Abnormal electrocardiogram [ECG] [EKG]: Secondary | ICD-10-CM

## 2020-08-13 DIAGNOSIS — M80071D Age-related osteoporosis with current pathological fracture, right ankle and foot, subsequent encounter for fracture with routine healing: Secondary | ICD-10-CM | POA: Diagnosis not present

## 2020-08-13 DIAGNOSIS — I10 Essential (primary) hypertension: Secondary | ICD-10-CM

## 2020-08-13 DIAGNOSIS — Z7901 Long term (current) use of anticoagulants: Secondary | ICD-10-CM

## 2020-08-13 DIAGNOSIS — R55 Syncope and collapse: Secondary | ICD-10-CM | POA: Diagnosis not present

## 2020-08-13 DIAGNOSIS — E46 Unspecified protein-calorie malnutrition: Secondary | ICD-10-CM | POA: Diagnosis not present

## 2020-08-13 DIAGNOSIS — I4729 Other ventricular tachycardia: Secondary | ICD-10-CM

## 2020-08-13 DIAGNOSIS — E1151 Type 2 diabetes mellitus with diabetic peripheral angiopathy without gangrene: Secondary | ICD-10-CM | POA: Diagnosis not present

## 2020-08-13 LAB — POCT INR: INR: 2.4 (ref 2.0–3.0)

## 2020-08-13 NOTE — Telephone Encounter (Signed)
David w/[facility] is calling to get verbal orders for a extension on [facility] PT  2 week 3  1 week 1 to continue to work on pts gait, balance, fall prevention so that pt can return to her own home.  May leave a detail msg on his secured voice mail.

## 2020-08-13 NOTE — Telephone Encounter (Signed)
Verbal orders given to David.  °

## 2020-08-13 NOTE — Patient Instructions (Signed)
Medication Instructions:  Your physician recommends that you continue on your current medications as directed. Please refer to the Current Medication list given to you today. *If you need a refill on your cardiac medications before your next appointment, please call your pharmacy*   Lab Work: BMET, Magnesium, TSH, Free T4 If you have labs (blood work) drawn today and your tests are completely normal, you will receive your results only by: [name] MyChart Message (if you have MyChart) OR . A paper copy in the mail If you have any lab test that is abnormal or we need to change your treatment, we will call you to review the results.   Testing/Procedures: Your physician has requested that you have a lexiscan myoview. For further information please visit [email or web address]. Please follow instruction sheet, as given.    Follow-Up: At [facility], you and your health needs are our priority.  As part of our continuing mission to provide you with exceptional heart care, we have created designated Provider Care Teams.  These Care Teams include your primary Cardiologist (physician) and Advanced Practice Providers (APPs -  Physician Assistants and Nurse Practitioners) who all work together to provide you with the care you need, when you need it.   Your next appointment:   6 week(s) (follow up after your test)  The format for your next appointment:   In Person or Virtual  Provider:   You may see Ena Dawley, MD or one of the following Advanced Practice Providers on your designated Care Team:    [name], PA-C  Ermalinda Barrios, PA-C

## 2020-08-13 NOTE — Telephone Encounter (Signed)
   To Cynthia Mathis but also CC'ing to triage in case Cynthia Mathis is out - Labs are still pending but when you call patient with lab results, please relay this additional info to patient and son. (Patient has memory issues and son Cynthia Mathis assisted with visit today.) Given 2 episodes of prior syncope and heart monitor findings, I talked to Dr. Meda Coffee about whether these findings would warrant seeing our electrical doctors in follow-up for their input on whether there was a relationship between the arrhythmias and her episodes. She says she agrees this would be helpful in addition to the plan we already have for stress test. Can we please refer to EP for syncope/NSVT?  I put this message in a phone note so that it was easier to tell where the decision to refer to EP came from.  Thanks,  [employer]

## 2020-08-13 NOTE — Patient Instructions (Signed)
Description   Spoke with [facility] RN and son [name] instructed pt to continue taking warfarin 1 tablet (2mg ) daily. Recheck INR in 2 weeks in the office.  Coumadin Clinic [phone].

## 2020-08-13 NOTE — Telephone Encounter (Signed)
Please okay this order  ?

## 2020-08-13 NOTE — Telephone Encounter (Addendum)
This recommendation for EP comes directly from my conversation with Dr. Meda Coffee in [place], so yes, confirming. As you stated, the concern is more due to the NSVT -which we discussed at today's OV could potentially cause someone to pass out (as opposed to her afib which we already knew about). If they do not wish to pursue, just let us know if they decide to reconsider at any point. Thanks Karlene Einstein!

## 2020-08-13 NOTE — Telephone Encounter (Signed)
Spoke with the pts Son [name].  He states pt is established with [name] NP in afib clinic, and pt had several test done in the hospital to assess any underlying arrhythmias.  Son states he would like confirmation with Dr. Meda Coffee and [name] PA-C that being referred to another Provider, would be necessary, given all her test done and being an afib clinic pt. Son states "I hope everyone is on the same page." Explained to the Son that reason for referring to EP is to further assess if there is a relationship between the arrhythmias and episodes.  Informed the pts Son we would refer specifically for syncope/NSVT.  Education provided.  Son states " I would still like to clarify with them if this is necessary before referring." Informed the pts Son that I would run this by [facility] and Dr. Meda Coffee for further advisement, and either myself or Dayna's CMA will follow-up with him on Monday.  Son verbalized understanding and agrees with this plan.

## 2020-08-14 LAB — BASIC METABOLIC PANEL
BUN/Creatinine Ratio: 20 (ref 12–28)
BUN: 25 mg/dL (ref 8–27)
CO2: 26 mmol/L (ref 20–29)
Calcium: 10.1 mg/dL (ref 8.7–10.3)
Chloride: 98 mmol/L (ref 96–106)
Creatinine, Ser: 1.28 mg/dL — ABNORMAL HIGH (ref 0.57–1.00)
GFR calc Af Amer: 44 mL/min/{1.73_m2} — ABNORMAL LOW (ref >59)
GFR calc non Af Amer: 38 mL/min/{1.73_m2} — ABNORMAL LOW (ref >59)
Glucose: 239 mg/dL — ABNORMAL HIGH (ref 65–99)
Potassium: 4 mmol/L (ref 3.5–5.2)
Sodium: 142 mmol/L (ref 134–144)

## 2020-08-14 LAB — T4: T4, Total: 7.8 ug/dL (ref 4.5–12.0)

## 2020-08-14 LAB — TSH: TSH: 0.333 u[IU]/mL — ABNORMAL LOW (ref 0.450–4.500)

## 2020-08-14 LAB — MAGNESIUM: Magnesium: 1.6 mg/dL (ref 1.6–2.3)

## 2020-08-16 ENCOUNTER — Telehealth: Payer: Self-pay | Admitting: *Deleted

## 2020-08-16 ENCOUNTER — Telehealth: Payer: Self-pay | Admitting: Pharmacist

## 2020-08-16 DIAGNOSIS — Z79899 Other long term (current) drug therapy: Secondary | ICD-10-CM

## 2020-08-16 DIAGNOSIS — R790 Abnormal level of blood mineral: Secondary | ICD-10-CM

## 2020-08-16 DIAGNOSIS — E46 Unspecified protein-calorie malnutrition: Secondary | ICD-10-CM | POA: Diagnosis not present

## 2020-08-16 DIAGNOSIS — R55 Syncope and collapse: Secondary | ICD-10-CM

## 2020-08-16 DIAGNOSIS — I10 Essential (primary) hypertension: Secondary | ICD-10-CM | POA: Diagnosis not present

## 2020-08-16 DIAGNOSIS — R7989 Other specified abnormal findings of blood chemistry: Secondary | ICD-10-CM

## 2020-08-16 DIAGNOSIS — E1151 Type 2 diabetes mellitus with diabetic peripheral angiopathy without gangrene: Secondary | ICD-10-CM | POA: Diagnosis not present

## 2020-08-16 DIAGNOSIS — M80071D Age-related osteoporosis with current pathological fracture, right ankle and foot, subsequent encounter for fracture with routine healing: Secondary | ICD-10-CM | POA: Diagnosis not present

## 2020-08-16 DIAGNOSIS — I480 Paroxysmal atrial fibrillation: Secondary | ICD-10-CM | POA: Diagnosis not present

## 2020-08-16 DIAGNOSIS — I4729 Other ventricular tachycardia: Secondary | ICD-10-CM

## 2020-08-16 DIAGNOSIS — I472 Ventricular tachycardia: Secondary | ICD-10-CM

## 2020-08-16 DIAGNOSIS — E038 Other specified hypothyroidism: Secondary | ICD-10-CM

## 2020-08-16 MED ORDER — MAGNESIUM OXIDE 400 MG PO CAPS: 400.0000 mg | ORAL_CAPSULE | Freq: Every day | ORAL | 1 refills | Status: DC

## 2020-08-16 NOTE — Telephone Encounter (Signed)
Spoke with the pts Son, and went over recommendations, as mentioned in this message from [employer].  Informed the pts Son [name] (on [place]) that we are more so referring her to EP, not for her afib (for we are aware of this), but more for the concern of NSVT, which could potentially cause someone to pass out (as opposed to her afib).  Informed the pts Son that I will place the referral to EP in the system and send a message to our [place], to call him back and arrange this new consult appt.  Son verbalized understanding and agrees with proceeding with this plan. Will send [employer] a staff message to call the pts Son [name] back and make new pt appt.

## 2020-08-16 NOTE — Telephone Encounter (Signed)
Charlie Pitter, PA-C  08/16/2020 12:11 PM EST      Jeanann Lewandowsky is out so will route to Arleny Kruger to help since she spoke with patient earlier. Blood sugar is quite elevated as well so recommend ongoing f/u with primary care for this as well as the thyroid (see note below).   Charlie Pitter, PA-C  08/16/2020 8:02 AM EST      Please let pt know labs show mild rise in kidney function compared to prior and decreased TSH which can result in hyperactive thyroid function. Magnesium level slightly low - please add Mag Ox 400mg  daily and recheck BMET/Mg when she comes for her stress test. Increase fluid intake. If Cr remains elevated may need adjustment on HCTZ/losartan. Also has telephone note. Please f/u with primary care for thyroid abnormality.     Spoke with the pts Son [name] (on [place]), and informed him that per [name] PA-C, based on her lab results, her BS was quite elevated, as well as her TSH level has decreased.  Informed the pts Son that [employer] PA-C recommends that she get into see her PCP, for further follow-up of this.  Will route lab results with these recommendations, to the pts PCP Dr. Sarajane Jews.  Also informed the pts Son that Lisbeth Renshaw mentioned her Mg level is slightly low, and she recommends that the pt start taking Magnesium Oxide 400 mg po daily, and we will recheck her BMET/Mg level again, when she comes back into the office to have her lexiscan done on 08/26/20.  Advised the pts Son to have her here a few mins before the stress test, to have these labs drawn.  Also advised the pts Son that her kidney function is elevated, and Dayna recommends that she increase her fluid intake, for if creatinine remains elevated, we may need to adjust on HCTZ/Losartan. Son verbalized understanding and agrees with this plan.  Son states he will contact her PCP for follow-up of TSH and BS levels.

## 2020-08-16 NOTE — Chronic Care Management (AMB) (Signed)
Chronic Care Management Pharmacy Assistant   Name: Cynthia Mathis  MRN: [id number] DOB: 14-Feb-1933  Reason for Encounter: Disease State and Medication Review  PCP : [name], MD  Allergies:   Allergies  Allergen Reactions  . Epinephrine Palpitations    heart racing    Medications: Outpatient Encounter Medications as of 08/16/2020  Medication Sig  . anastrozole (ARIMIDEX) 1 MG tablet Take 1 tablet (1 mg total) by mouth daily.  . Calcium Carbonate-Vitamin D 600-400 MG-UNIT tablet Take 1 tablet by mouth daily.  . Cyanocobalamin (VITAMIN B 12) 500 MCG TABS Take 1,000 mcg by mouth daily.   [name] diltiazem (CARDIZEM) 30 MG tablet Take 1 tablet every 4 hours AS NEEDED for afib heart rate >100 as long as top BP >100.  . hydrochlorothiazide (HYDRODIURIL) 25 MG tablet Take 25 mg by mouth daily.  [name] losartan (COZAAR) 100 MG tablet Take 100 mg by mouth daily.  . metoprolol tartrate (LOPRESSOR) 25 MG tablet Take 0.5 tablets (12.5 mg total) by mouth 2 (two) times daily.  . Multiple Vitamins-Minerals (MULTIVITAMIN & MINERAL PO) Take 1 tablet by mouth daily.  . Psyllium (METAMUCIL PO) Take by mouth as needed.  . warfarin (COUMADIN) 2 MG tablet Take 1/2 a tablet to 1 tablet by mouth daily as directed by the coumadin clinic.   No facility-administered encounter medications on file as of 08/16/2020.    Current Diagnosis: Patient Active Problem List   Diagnosis Date Noted  . Long term (current) use of anticoagulants 07/23/2020  . Closed displaced trimalleolar fracture of right ankle 06/10/2020  . Leukocytosis 06/10/2020  . Bradycardia 06/10/2020  . Syncope 06/10/2020  . Aortic atherosclerosis ([place]) 05/10/2020  . Secondary hypercoagulable state ([place]) 04/23/2020  . Posterior vitreous detachment of right eye 12/02/2019  . Exudative age-related macular degeneration of left eye with active choroidal neovascularization ([place]) 10/28/2019  . Exudative age-related macular degeneration of right eye  with active choroidal neovascularization ([place]) 10/28/2019  . Retinal hemorrhage of left eye 10/28/2019  . Malignant neoplasm of upper-outer quadrant of right breast in female, estrogen receptor positive ([place]) 03/26/2019  . Mild cognitive impairment 03/28/2018  . Foot pain, right 12/05/2017  . Hx of colonic polyps   . Diverticulosis of colon   . Anxiety   . Paroxysmal atrial fibrillation ([place]) 08/24/2016  . Low back pain 05/06/2015  . Osteopenia 08/04/2013  . Memory loss 09/17/2012  . 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 Macular degeneration (senile) of retina 08/12/2008  . Osteoarthritis 08/12/2008  . Impaired fasting glucose 02/10/2008  . Hypothyroidism 08/08/2007  . Dyslipidemia 08/08/2007  . Essential hypertension 08/08/2007    Goals Addressed   None    Reviewed chart for medication changes ahead of medication coordination call. Reviewed OVs, Consults, or hospital visits since last care coordination call/Pharmacist visit.   No medication changes indicated.  BP Readings from Last 3 Encounters:  08/13/20 122/76  07/19/20 118/80  07/06/20 (!) 176/84    Lab Results  Component Value Date   HGBA1C 7.4 (H) 06/11/2020     Patient obtains medications through Adherence Packaging  30 Days  Last adherence delivery included:  [name]  Anastrozole (ARIMIDEX) 1 mg: one tablet at dinner . Calcium Carbonate-Vitamin D 600-400 mg-unit: one at breakfast . Cyanocobalamin (VITAMIN B 12) 1000 mcg: one tablet at breakfast . Losartan (COZAAR) 100 MG tablet: one tablet at breakfast . Hydrochlorothiazide (HYDRODIURIL) 25 MG tablet: one tablet at breakfast . Metoprolol tartrate (LOPRESSOR) 25 mg:  tablet (12.5 mg) at  breakfast and  at dinner . Multiple Vitamins-Minerals: one tablet at breakfast . Warfarin (Coumadin) 2 mg Take 1/2 a tablet to 1 tablet by mouth daily as directed by the coumadin clinic. (Vials)  Patient declined the following medication last month due to PRN use. . Diltiazem  (CARDIZEM) 30 mg tablet PRN  Patient is due for next adherence delivery on: 08/20/2020. Called patient and reviewed medications and coordinated delivery. This delivery to include: . Anastrozole (ARIMIDEX) 1 mg: one tablet at dinner . Calcium Carbonate-Vitamin D 600-400 mg-unit: one at breakfast . Cyanocobalamin (VITAMIN B 12) 1000 mcg: one tablet at breakfast . Losartan (COZAAR) 100 MG tablet: one tablet at breakfast . Hydrochlorothiazide (HYDRODIURIL) 25 MG tablet: one tablet at breakfast . Metoprolol tartrate (LOPRESSOR) 25 mg:  tablet (12.5 mg) at breakfast and  at dinner . Multiple Vitamins-Minerals: one tablet at breakfast . Warfarin (Coumadin) 2 mg Take 1/2 a tablet to 1 tablet by mouth daily as directed by the coumadin clinic. (Vials)  I spoke with the patient and review medications. There are no changes in medications currently. Patient declined these medication this month due to PRN use/additional supply on hand.The patient is taking the following medications: . Diltiazem (CARDIZEM) 30 mg tablet PRN  She does not needed refill at this time.  Confirmed delivery date of 08/20/2020, advised patient that pharmacy will contact them the morning of delivery.  Follow-Up:  Coordination of Enhanced Pharmacy Services and Pharmacist Review  Reviewed chart prior to disease state call. Spoke with patient regarding BP Recent Office Vitals: BP Readings from Last 3 Encounters:  08/13/20 122/76  07/19/20 118/80  07/06/20 (!) 176/84   Pulse Readings from Last 3 Encounters:  08/13/20 71  07/19/20 67  07/06/20 65    Wt Readings from Last 3 Encounters:  08/13/20 145 lb 12.8 oz (66.1 kg)  07/19/20 137 lb 6.4 oz (62.3 kg)  07/06/20 148 lb 6.4 oz (67.3 kg)     Kidney Function Lab Results  Component Value Date/Time   CREATININE 1.28 (H) 08/13/2020 02:51 PM   CREATININE 0.94 06/11/2020 03:33 AM   CREATININE 1.06 (H) 03/26/2019 03:21 PM   GFR 59.66 (L) 02/13/2018 12:36 PM   GFRNONAA 38  (L) 08/13/2020 02:51 PM   GFRNONAA 59 (L) 06/11/2020 03:33 AM   GFRNONAA 48 (L) 03/26/2019 03:21 PM   GFRAA 44 (L) 08/13/2020 02:51 PM   GFRAA 56 (L) 03/26/2019 03:21 PM    BMP Latest Ref Rng & Units 08/13/2020 06/11/2020 06/10/2020  Glucose 65 - 99 mg/dL 239(H) 173(H) 160(H)  BUN 8 - 27 mg/dL 25 18 17   Creatinine 0.57 - 1.00 mg/dL 1.28(H) 0.94 0.84  BUN/Creat Ratio 12 - 28 20 - -  Sodium 134 - 144 mmol/L 142 140 139  Potassium 3.5 - 5.2 mmol/L 4.0 3.6 4.6  Chloride 96 - 106 mmol/L 98 103 101  CO2 20 - 29 mmol/L 26 23 23   Calcium 8.7 - 10.3 mg/dL 10.1 9.2 9.4    . Current antihypertensive regimen:  o Losartan (COZAAR) 100 MG tablet: one tablet at breakfast o Hydrochlorothiazide (HYDRODIURIL) 25 MG tablet: one tablet at breakfast o Metoprolol tartrate (LOPRESSOR) 25 mg:  tablet (12.5 mg) at breakfast and  at dinner . How often are you checking your Blood Pressure? 1-2x per week . Current home BP readings:  o 01/27 122/72 o 02/02 120/76 o 02/04 132/80 o 02/04 126/74 . What recent interventions/DTP's have been made by any provider to improve Blood Pressure control since last CPP  Visit: None . Any recent hospitalizations or ED visits since last visit with CPP? Yes . What diet changes have been made to improve Blood Pressure Control?  o No change . What exercise is being done to improve your Blood Pressure Control?  o Physical therapy twice a week  Adherence Review: Is the patient currently on ACE/ARB medication? Yes Does the patient have >5 day gap between last estimated fill dates? No  Maia Breslow, [place] Assistant [phone]

## 2020-08-16 NOTE — Telephone Encounter (Signed)
refer to EP per [name] PA-C and Dr. Meda Coffee Received: Today [facility], [address]  Winifred Olive M, LPN Pt is scheduled 0/32/12 at 9am    Pt is scheduled for new EP consult appt for 08/30/20 at 0900, to see Dr. Rayann Heman.  She will still proceed with scheduled lexiscan for 08/26/20, as previously planned and outlined.  Will send this message to [name] PA-C as an Juluis Rainier, to make her aware Son agreed to EP appt.

## 2020-08-16 NOTE — Telephone Encounter (Signed)
-----   Message from Charlie Pitter, [place] sent at 08/16/2020 12:11 PM EST ----- Jeanann Lewandowsky is out so will route to Lamyia Cdebaca to help since she spoke with patient earlier. Blood sugar is quite elevated as well so recommend ongoing f/u with primary care for this as well as the thyroid (see note below).

## 2020-08-17 ENCOUNTER — Ambulatory Visit: Payer: Medicare Other | Admitting: Dermatology

## 2020-08-19 DIAGNOSIS — E1151 Type 2 diabetes mellitus with diabetic peripheral angiopathy without gangrene: Secondary | ICD-10-CM | POA: Diagnosis not present

## 2020-08-19 DIAGNOSIS — I480 Paroxysmal atrial fibrillation: Secondary | ICD-10-CM | POA: Diagnosis not present

## 2020-08-19 DIAGNOSIS — I10 Essential (primary) hypertension: Secondary | ICD-10-CM | POA: Diagnosis not present

## 2020-08-19 DIAGNOSIS — E46 Unspecified protein-calorie malnutrition: Secondary | ICD-10-CM | POA: Diagnosis not present

## 2020-08-19 DIAGNOSIS — M80071D Age-related osteoporosis with current pathological fracture, right ankle and foot, subsequent encounter for fracture with routine healing: Secondary | ICD-10-CM | POA: Diagnosis not present

## 2020-08-23 ENCOUNTER — Telehealth: Payer: Self-pay | Admitting: Family Medicine

## 2020-08-23 DIAGNOSIS — I10 Essential (primary) hypertension: Secondary | ICD-10-CM | POA: Diagnosis not present

## 2020-08-23 DIAGNOSIS — E1151 Type 2 diabetes mellitus with diabetic peripheral angiopathy without gangrene: Secondary | ICD-10-CM | POA: Diagnosis not present

## 2020-08-23 DIAGNOSIS — E46 Unspecified protein-calorie malnutrition: Secondary | ICD-10-CM | POA: Diagnosis not present

## 2020-08-23 DIAGNOSIS — I480 Paroxysmal atrial fibrillation: Secondary | ICD-10-CM | POA: Diagnosis not present

## 2020-08-23 DIAGNOSIS — M80071D Age-related osteoporosis with current pathological fracture, right ankle and foot, subsequent encounter for fracture with routine healing: Secondary | ICD-10-CM | POA: Diagnosis not present

## 2020-08-23 NOTE — Telephone Encounter (Signed)
Please advise on message below. Thanks. Dm/cma  

## 2020-08-23 NOTE — Telephone Encounter (Signed)
Dionka from [facility] is calling to advise the patient's bp today was154/92.  She said this is the first day her bottom number has been over 90.  FYI

## 2020-08-24 NOTE — Telephone Encounter (Signed)
Spoke to [facility], and advised on providers recommendations. Dm/cma

## 2020-08-24 NOTE — Telephone Encounter (Signed)
Noted. Keep checking it and let me know if it stays above 867 systolic or 90 diastolic

## 2020-08-25 ENCOUNTER — Telehealth: Payer: Self-pay

## 2020-08-25 DIAGNOSIS — M80071D Age-related osteoporosis with current pathological fracture, right ankle and foot, subsequent encounter for fracture with routine healing: Secondary | ICD-10-CM | POA: Diagnosis not present

## 2020-08-25 DIAGNOSIS — E46 Unspecified protein-calorie malnutrition: Secondary | ICD-10-CM | POA: Diagnosis not present

## 2020-08-25 DIAGNOSIS — E1151 Type 2 diabetes mellitus with diabetic peripheral angiopathy without gangrene: Secondary | ICD-10-CM | POA: Diagnosis not present

## 2020-08-25 DIAGNOSIS — I480 Paroxysmal atrial fibrillation: Secondary | ICD-10-CM | POA: Diagnosis not present

## 2020-08-25 DIAGNOSIS — I10 Essential (primary) hypertension: Secondary | ICD-10-CM | POA: Diagnosis not present

## 2020-08-25 NOTE — Telephone Encounter (Signed)
Detailed instructions left on the patient's answering machine. Asked to call back with any questions. S.March Steyer EMTP 

## 2020-08-26 ENCOUNTER — Ambulatory Visit (HOSPITAL_COMMUNITY): Payer: Medicare Other | Attending: Internal Medicine

## 2020-08-26 ENCOUNTER — Other Ambulatory Visit: Payer: Medicare Other | Admitting: *Deleted

## 2020-08-26 ENCOUNTER — Other Ambulatory Visit: Payer: Self-pay

## 2020-08-26 ENCOUNTER — Ambulatory Visit (INDEPENDENT_AMBULATORY_CARE_PROVIDER_SITE_OTHER): Payer: Medicare Other | Admitting: Pharmacist

## 2020-08-26 DIAGNOSIS — I4891 Unspecified atrial fibrillation: Secondary | ICD-10-CM | POA: Diagnosis not present

## 2020-08-26 DIAGNOSIS — R790 Abnormal level of blood mineral: Secondary | ICD-10-CM

## 2020-08-26 DIAGNOSIS — I10 Essential (primary) hypertension: Secondary | ICD-10-CM | POA: Insufficient documentation

## 2020-08-26 DIAGNOSIS — R002 Palpitations: Secondary | ICD-10-CM | POA: Diagnosis not present

## 2020-08-26 DIAGNOSIS — I4729 Other ventricular tachycardia: Secondary | ICD-10-CM

## 2020-08-26 DIAGNOSIS — R55 Syncope and collapse: Secondary | ICD-10-CM | POA: Diagnosis not present

## 2020-08-26 DIAGNOSIS — Z7901 Long term (current) use of anticoagulants: Secondary | ICD-10-CM | POA: Diagnosis not present

## 2020-08-26 DIAGNOSIS — E038 Other specified hypothyroidism: Secondary | ICD-10-CM | POA: Diagnosis not present

## 2020-08-26 DIAGNOSIS — I480 Paroxysmal atrial fibrillation: Secondary | ICD-10-CM

## 2020-08-26 DIAGNOSIS — Z79899 Other long term (current) drug therapy: Secondary | ICD-10-CM

## 2020-08-26 DIAGNOSIS — I472 Ventricular tachycardia: Secondary | ICD-10-CM | POA: Diagnosis not present

## 2020-08-26 DIAGNOSIS — R7989 Other specified abnormal findings of blood chemistry: Secondary | ICD-10-CM

## 2020-08-26 DIAGNOSIS — Z5181 Encounter for therapeutic drug level monitoring: Secondary | ICD-10-CM

## 2020-08-26 LAB — BASIC METABOLIC PANEL
BUN/Creatinine Ratio: 20 (ref 12–28)
BUN: 24 mg/dL (ref 8–27)
CO2: 23 mmol/L (ref 20–29)
Calcium: 9.6 mg/dL (ref 8.7–10.3)
Chloride: 96 mmol/L (ref 96–106)
Creatinine, Ser: 1.21 mg/dL — ABNORMAL HIGH (ref 0.57–1.00)
GFR calc Af Amer: 47 mL/min/{1.73_m2} — ABNORMAL LOW (ref >59)
GFR calc non Af Amer: 41 mL/min/{1.73_m2} — ABNORMAL LOW (ref >59)
Glucose: 315 mg/dL — ABNORMAL HIGH (ref 65–99)
Potassium: 3.6 mmol/L (ref 3.5–5.2)
Sodium: 140 mmol/L (ref 134–144)

## 2020-08-26 LAB — MAGNESIUM: Magnesium: 1.6 mg/dL (ref 1.6–2.3)

## 2020-08-26 LAB — POCT INR: INR: 3.8 — AB (ref 2.0–3.0)

## 2020-08-26 MED ORDER — TECHNETIUM TC 99M TETROFOSMIN IV KIT
9.8000 | PACK | Freq: Once | INTRAVENOUS | Status: AC | PRN
  Administered 2020-08-26: 9.8 via INTRAVENOUS
  Filled 2020-08-26: qty 10

## 2020-08-26 NOTE — Patient Instructions (Signed)
Hold warfarin tonight continue taking warfarin 1 tablet (2mg ) daily. Recheck INR in 2 weeks in the office.  Coumadin Clinic [phone].

## 2020-08-27 ENCOUNTER — Ambulatory Visit (HOSPITAL_COMMUNITY): Payer: Medicare Other | Attending: Cardiovascular Disease

## 2020-08-27 DIAGNOSIS — I472 Ventricular tachycardia: Secondary | ICD-10-CM | POA: Insufficient documentation

## 2020-08-27 LAB — MYOCARDIAL PERFUSION IMAGING
LV dias vol: 31 mL (ref 46–106)
LV sys vol: 7 mL
Peak HR: 77 {beats}/min
Rest HR: 54 {beats}/min
SDS: 1
SRS: 0
SSS: 1
TID: 0.89

## 2020-08-27 MED ORDER — REGADENOSON 0.4 MG/5ML IV SOLN
0.4000 mg | Freq: Once | INTRAVENOUS | Status: AC
  Administered 2020-08-27: 0.4 mg via INTRAVENOUS

## 2020-08-27 MED ORDER — TECHNETIUM TC 99M TETROFOSMIN IV KIT
30.4000 | PACK | Freq: Once | INTRAVENOUS | Status: AC | PRN
  Administered 2020-08-27: 30.4 via INTRAVENOUS
  Filled 2020-08-27: qty 31

## 2020-08-30 ENCOUNTER — Encounter: Payer: Self-pay | Admitting: Internal Medicine

## 2020-08-30 ENCOUNTER — Other Ambulatory Visit: Payer: Self-pay

## 2020-08-30 ENCOUNTER — Ambulatory Visit: Payer: Medicare Other | Admitting: Internal Medicine

## 2020-08-30 VITALS — BP 154/80 | HR 75 | Ht 63.0 in | Wt 146.2 lb

## 2020-08-30 DIAGNOSIS — I472 Ventricular tachycardia: Secondary | ICD-10-CM

## 2020-08-30 DIAGNOSIS — R55 Syncope and collapse: Secondary | ICD-10-CM | POA: Diagnosis not present

## 2020-08-30 DIAGNOSIS — I4729 Other ventricular tachycardia: Secondary | ICD-10-CM

## 2020-08-30 DIAGNOSIS — I480 Paroxysmal atrial fibrillation: Secondary | ICD-10-CM | POA: Diagnosis not present

## 2020-08-30 MED ORDER — HYDROCHLOROTHIAZIDE 12.5 MG PO CAPS: 12.5000 mg | ORAL_CAPSULE | Freq: Every day | ORAL | 3 refills | Status: DC

## 2020-08-30 NOTE — Progress Notes (Signed)
Electrophysiology Office Note   Date:  08/30/2020   ID:  Mathis, Cynthia 1932/11/02, MRN [id number]  PCP:  [name], MD  Cardiologist:  [name] Primary Electrophysiologist: [name], MD    CC: syncope   History of Present Illness: Cynthia Mathis is a 87 y.o. female who presents today for electrophysiology evaluation.   She is referred by [name] and [name] for evaluation of afib and syncope.  The patient had transient LOC after standing after a BM and straining in December.  She had leg injury with this.  She has had one prior episode of syncope which also occurred after straining with BM.  She has had an extensive workup.  Event monitor reveals afib (8% burden) as well as short NSVT.  No cause for her syncope was identified.  She is not very active.  She is accompanied by family today.  Today, she denies symptoms of palpitations, chest pain, shortness of breath, orthopnea, PND, lower extremity edema, claudication, dizziness, presyncope, syncope, bleeding, or neurologic sequela. The patient is tolerating medications without difficulties and is otherwise without complaint today.    Past Medical History:  Diagnosis Date  . Anxiety   . Borderline diabetes mellitus   . Cancer ([place]) 2020   breast  . Diverticulosis of colon   . DJD (degenerative joint disease)   . Dyslipidemia   . History of hematuria   . History of osteoporosis   . Hx of colonic polyps   . Hypertension   . Hypothyroid   . Macular degeneration    sees [name] for general eye care, sees [name] for retinal injections   . Memory loss   . NSVT (nonsustained ventricular tachycardia) ([place])   . Osteoporosis    took bisphosphonates, now off. Last DEXA in 2015 showed osteopenia   . Paroxysmal atrial fibrillation (HCC)    sees [name]   . SCCA (squamous cell carcinoma) of skin 12/11/2019   Left Hand Posterior (in situ)   Past Surgical History:  Procedure Laterality  Date  . APPENDECTOMY  1947  . BREAST LUMPECTOMY WITH RADIOACTIVE SEED LOCALIZATION Right 05/14/2019   Procedure: RIGHT BREAST LUMPECTOMY WITH RADIOACTIVE SEED LOCALIZATION;  Surgeon: [name], MD;  Location: [facility];  Service: General;  Laterality: Right;  . CATARACT EXTRACTION W/PHACO Bilateral   . COLONOSCOPY  12/02/2014   per [name], severe diverticulosis but no polyps   . MASS EXCISION  04/17/2012   Procedure: EXCISION MASS;  Surgeon: [name], MD;  Location: [facility];  Service: General;  Laterality: N/A;  . ORIF ANKLE FRACTURE Right 06/14/2020   Procedure: OPEN REDUCTION INTERNAL FIXATION (ORIF) ANKLE FRACTURE;  Surgeon: [name], MD;  Location: [facility];  Service: Orthopedics;  Laterality: Right;  . THYROIDECTOMY  1990   [name]     Current Outpatient Medications  Medication Sig Dispense Refill  . anastrozole (ARIMIDEX) 1 MG tablet Take 1 tablet (1 mg total) by mouth daily. 90 tablet 0  . Calcium Carbonate-Vitamin D 600-400 MG-UNIT tablet Take 1 tablet by mouth daily.    . Cyanocobalamin (VITAMIN B 12) 500 MCG TABS Take 1,000 mcg by mouth daily.     [name] diltiazem (CARDIZEM) 30 MG tablet Take 1 tablet every 4 hours AS NEEDED for afib heart rate >100 as long as top BP >100. 45 tablet 1  . hydrochlorothiazide (HYDRODIURIL) 25 MG tablet Take 25 mg by mouth daily.    [name]  losartan (COZAAR) 100 MG tablet Take 100 mg by mouth daily.    . Magnesium Oxide 400 MG CAPS Take 1 capsule (400 mg total) by mouth daily. 30 capsule 1  . metoprolol tartrate (LOPRESSOR) 25 MG tablet Take 0.5 tablets (12.5 mg total) by mouth 2 (two) times daily. 30 tablet 11  . Multiple Vitamins-Minerals (MULTIVITAMIN & MINERAL PO) Take 1 tablet by mouth daily.    . Psyllium (METAMUCIL PO) Take by mouth as needed.    . warfarin (COUMADIN) 2 MG tablet Take 1/2 a tablet to 1 tablet by mouth daily as directed by the coumadin clinic. 30 tablet 1   No current facility-administered  medications for this visit.    Allergies:   Epinephrine   Social History:  The patient  reports that she quit smoking about 55 years ago. She has never used smokeless tobacco. She reports current alcohol use of about 3.0 standard drinks of alcohol per week. She reports that she does not use drugs.   Family History:  The patient's  family history includes Stroke in her mother.    ROS:  Please see the history of present illness.   All other systems are personally reviewed and negative.    PHYSICAL EXAM: VS:  BP (!) 154/80   Pulse 75   Ht 5\' 3"  (1.6 m)   Wt 146 lb 3.2 oz (66.3 kg)   SpO2 98%   BMI 25.90 kg/m  , BMI Body mass index is 25.9 kg/m. GEN: Well nourished, well developed, in no acute distress HEENT: normal Neck: no JVD, carotid bruits, or masses Cardiac: RRR; no murmurs, rubs, or gallops,no edema  Respiratory:  clear to auscultation bilaterally, normal work of breathing GI: soft, nontender, nondistended, + BS MS: no deformity or atrophy Skin: warm and dry  Neuro:  Strength and sensation are intact Psych: euthymic mood, full affect  EKG:  EKG is ordered today. The ekg ordered today is personally reviewed and shows sinus rhythm   Recent Labs: 05/10/2020: ALT 22 06/19/2020: Hemoglobin 12.8; Platelets 267 08/13/2020: TSH 0.333 08/26/2020: BUN 24; Creatinine, Ser 1.21; Magnesium 1.6; Potassium 3.6; Sodium 140  personally reviewed   Lipid Panel     Component Value Date/Time   CHOL 196 02/05/2019 0852   TRIG 186 (H) 02/05/2019 0852   HDL 50 02/05/2019 0852   CHOLHDL 3.9 02/05/2019 0852   CHOLHDL 4 02/13/2017 0905   VLDL 31.4 02/13/2017 0905   LDLCALC 109 (H) 02/05/2019 0852   personally reviewed   Wt Readings from Last 3 Encounters:  08/30/20 146 lb 3.2 oz (66.3 kg)  08/26/20 145 lb (65.8 kg)  08/13/20 145 lb 12.8 oz (66.1 kg)      Other studies personally reviewed: Additional studies/ records that were reviewed today include: Dayna Dunns notes, prior echo,  event monitor  Review of the above records today demonstrates: as above   ASSESSMENT AND PLAN:  1.  Vagal syncope Occurred in the setting of straining/ BM We discussed stool softeners/ conservative measures No further EP workup required.  Reduce hctz to 12.5mg  daily as this and metoprolol will increase her tendency to have syncope.  2. HTN Family reports that BP is mostly < 150s/90s at home Reduce hctz to 12.5mg  daily as above If on return to general cardiology, her BP is mostly controlled, we should stop hctz at that time.  3. afib Rates are reasonably well controlled She has prn diltiazem She is on coumadin No further workup is planned  4. NSVT  Benign/ asymptomatic Did not contribute to her syncope Continue low dose metoprolol No further workup is planned  Risks, benefits and potential toxicities for medications prescribed and/or refilled reviewed with patient today.    Follow-up:  Return to see EP as needed Follow-up with general cardiology as scheduled    Signed, [name], MD  08/30/2020 9:21 AM     [address] [place] [address] [phone] (office) [phone] (fax)

## 2020-08-30 NOTE — Patient Instructions (Addendum)
Medication Instructions:  Reduce your Hydrochlorothiazide to 12.5 mg daily Your physician recommends that you continue on your current medications as directed. Please refer to the Current Medication list given to you today.  Labwork: None ordered.  Testing/Procedures: None ordered.  Follow-Up: Your physician wants you to follow-up in:  Follow up with as scheduled with cardiology EP will see as needed.    Any Other Special Instructions Will Be Listed Below (If Applicable).  If you need a refill on your cardiac medications before your next appointment, please call your pharmacy.

## 2020-08-31 DIAGNOSIS — M80071D Age-related osteoporosis with current pathological fracture, right ankle and foot, subsequent encounter for fracture with routine healing: Secondary | ICD-10-CM | POA: Diagnosis not present

## 2020-08-31 DIAGNOSIS — I10 Essential (primary) hypertension: Secondary | ICD-10-CM | POA: Diagnosis not present

## 2020-08-31 DIAGNOSIS — E1151 Type 2 diabetes mellitus with diabetic peripheral angiopathy without gangrene: Secondary | ICD-10-CM | POA: Diagnosis not present

## 2020-08-31 DIAGNOSIS — I480 Paroxysmal atrial fibrillation: Secondary | ICD-10-CM | POA: Diagnosis not present

## 2020-08-31 DIAGNOSIS — E46 Unspecified protein-calorie malnutrition: Secondary | ICD-10-CM | POA: Diagnosis not present

## 2020-09-02 DIAGNOSIS — I480 Paroxysmal atrial fibrillation: Secondary | ICD-10-CM | POA: Diagnosis not present

## 2020-09-02 DIAGNOSIS — I10 Essential (primary) hypertension: Secondary | ICD-10-CM | POA: Diagnosis not present

## 2020-09-02 DIAGNOSIS — M80071D Age-related osteoporosis with current pathological fracture, right ankle and foot, subsequent encounter for fracture with routine healing: Secondary | ICD-10-CM | POA: Diagnosis not present

## 2020-09-02 DIAGNOSIS — E1151 Type 2 diabetes mellitus with diabetic peripheral angiopathy without gangrene: Secondary | ICD-10-CM | POA: Diagnosis not present

## 2020-09-02 DIAGNOSIS — E46 Unspecified protein-calorie malnutrition: Secondary | ICD-10-CM | POA: Diagnosis not present

## 2020-09-06 ENCOUNTER — Telehealth: Payer: Self-pay | Admitting: *Deleted

## 2020-09-06 DIAGNOSIS — Z79899 Other long term (current) drug therapy: Secondary | ICD-10-CM

## 2020-09-06 NOTE — Telephone Encounter (Signed)
-----   Message from Charlie Pitter, [place] sent at 08/30/2020  4:44 PM EST ----- Following up labs -Dr. Rayann Heman reduced HCTZ at [facility] today. These labs showed continued mild elevation in creatinine. Magnesium not quite yet at goal. Reducing the HCTZ should help these. Please make sure she started MaxOx 400mg  daily as we discussed. Would recheck BMET / Mg at next coumadin OV 3/3. We'll check in with her about BP at that time. If at goal, would likely stop HCTZ altogether as Dr. Rayann Heman suggested. Thx.

## 2020-09-07 ENCOUNTER — Other Ambulatory Visit: Payer: Self-pay | Admitting: Cardiology

## 2020-09-08 ENCOUNTER — Ambulatory Visit (INDEPENDENT_AMBULATORY_CARE_PROVIDER_SITE_OTHER): Payer: Medicare Other

## 2020-09-08 ENCOUNTER — Ambulatory Visit (INDEPENDENT_AMBULATORY_CARE_PROVIDER_SITE_OTHER): Payer: Medicare Other | Admitting: Orthopaedic Surgery

## 2020-09-08 ENCOUNTER — Encounter: Payer: Self-pay | Admitting: Orthopaedic Surgery

## 2020-09-08 ENCOUNTER — Other Ambulatory Visit: Payer: Self-pay

## 2020-09-08 DIAGNOSIS — I10 Essential (primary) hypertension: Secondary | ICD-10-CM | POA: Diagnosis not present

## 2020-09-08 DIAGNOSIS — E1151 Type 2 diabetes mellitus with diabetic peripheral angiopathy without gangrene: Secondary | ICD-10-CM | POA: Diagnosis not present

## 2020-09-08 DIAGNOSIS — E46 Unspecified protein-calorie malnutrition: Secondary | ICD-10-CM | POA: Diagnosis not present

## 2020-09-08 DIAGNOSIS — M80071D Age-related osteoporosis with current pathological fracture, right ankle and foot, subsequent encounter for fracture with routine healing: Secondary | ICD-10-CM | POA: Diagnosis not present

## 2020-09-08 DIAGNOSIS — S82851G Displaced trimalleolar fracture of right lower leg, subsequent encounter for closed fracture with delayed healing: Secondary | ICD-10-CM | POA: Diagnosis not present

## 2020-09-08 DIAGNOSIS — I480 Paroxysmal atrial fibrillation: Secondary | ICD-10-CM | POA: Diagnosis not present

## 2020-09-08 NOTE — Progress Notes (Signed)
Post-Op Visit Note   Patient: Cynthia Mathis           Date of Birth: 1933/07/08           MRN: [id number] Visit Date: 09/08/2020 PCP: [name], MD   Assessment & Plan:  Chief Complaint:  Chief Complaint  Patient presents with  . Right Ankle - Pain   Visit Diagnoses:  1. Closed displaced trimalleolar fracture of right ankle with delayed healing, subsequent encounter     Plan: Patient is a very pleasant 87 year old female who comes in today 12 weeks out ORIF right trimalleolar ankle fracture.  She has been doing well.  She has her last home health physical therapy visit today.  She is ambulating at home without a cane but out in public she is using a cane.  She has minimal pain.  She still notes that her right ankle feels little different than the left.  Examination of her right ankle reveals no swelling.  No bony tenderness.  She has regained a fair amount of range of motion but still has some limitation with dorsiflexion.  She has slight pain with inversion and eversion.  She is neurovascular intact distally.  Radiographically, the fibula is healing but she still has a persistent fracture lucency to the medial malleolus consistent with delayed union.  At this point, we will submit for bone stimulator.  We will also start her in outpatient physical therapy and a referral has been made.  She will follow up with [place] in 6 weeks time for repeat evaluation and 3 view x-rays of the right ankle.  Call with concerns or questions in meantime.  Follow-Up Instructions: Return in about 6 weeks (around 10/20/2020).   Orders:  Orders Placed This Encounter  Procedures  . XR Ankle Complete Right  . Ambulatory referral to Physical Therapy   No orders of the defined types were placed in this encounter.   Imaging: XR Ankle Complete Right  Result Date: 09/08/2020 X-rays demonstrate healing of the fibula fracture but there still remains fracture lucency to the medial malleolus.  No hardware  complication.   PMFS History: Patient Active Problem List   Diagnosis Date Noted  . Long term (current) use of anticoagulants 07/23/2020  . Closed displaced trimalleolar fracture of right ankle 06/10/2020  . Leukocytosis 06/10/2020  . Bradycardia 06/10/2020  . Syncope 06/10/2020  . Aortic atherosclerosis ([place]) 05/10/2020  . Secondary hypercoagulable state ([place]) 04/23/2020  . Posterior vitreous detachment of right eye 12/02/2019  . Exudative age-related macular degeneration of left eye with active choroidal neovascularization ([place]) 10/28/2019  . Exudative age-related macular degeneration of right eye with active choroidal neovascularization ([place]) 10/28/2019  . Retinal hemorrhage of left eye 10/28/2019  . Malignant neoplasm of upper-outer quadrant of right breast in female, estrogen receptor positive ([place]) 03/26/2019  . Mild cognitive impairment 03/28/2018  . Foot pain, right 12/05/2017  . Hx of colonic polyps   . Diverticulosis of colon   . Anxiety   . Paroxysmal atrial fibrillation ([place]) 08/24/2016  . Low back pain 05/06/2015  . Osteopenia 08/04/2013  . Memory loss 09/17/2012  . 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 Macular degeneration (senile) of retina 08/12/2008  . Osteoarthritis 08/12/2008  . Impaired fasting glucose 02/10/2008  . Hypothyroidism 08/08/2007  . Dyslipidemia 08/08/2007  . Essential hypertension 08/08/2007   Past Medical History:  Diagnosis Date  . Anxiety   . Borderline diabetes mellitus   . Cancer [facility]) 2020   breast  . Diverticulosis  of colon   . DJD (degenerative joint disease)   . Dyslipidemia   . History of hematuria   . History of osteoporosis   . Hx of colonic polyps   . Hypertension   . Hypothyroid   . Macular degeneration    sees [name] for general eye care, sees [name] for retinal injections   . Memory loss   . NSVT (nonsustained ventricular tachycardia) ([place])   . Osteoporosis    took bisphosphonates, now off. Last DEXA in  2015 showed osteopenia   . Paroxysmal atrial fibrillation (HCC)    sees [name]   . SCCA (squamous cell carcinoma) of skin 12/11/2019   Left Hand Posterior (in situ)    Family History  Problem Relation Age of Onset  . Stroke Mother   . Colon cancer Neg Hx   . Stomach cancer Neg Hx     Past Surgical History:  Procedure Laterality Date  . APPENDECTOMY  1947  . BREAST LUMPECTOMY WITH RADIOACTIVE SEED LOCALIZATION Right 05/14/2019   Procedure: RIGHT BREAST LUMPECTOMY WITH RADIOACTIVE SEED LOCALIZATION;  Surgeon: [name], MD;  Location: [facility];  Service: General;  Laterality: Right;  . CATARACT EXTRACTION W/PHACO Bilateral   . COLONOSCOPY  12/02/2014   per [name], severe diverticulosis but no polyps   . MASS EXCISION  04/17/2012   Procedure: EXCISION MASS;  Surgeon: [name], MD;  Location: [facility];  Service: General;  Laterality: N/A;  . ORIF ANKLE FRACTURE Right 06/14/2020   Procedure: OPEN REDUCTION INTERNAL FIXATION (ORIF) ANKLE FRACTURE;  Surgeon: [name], MD;  Location: [facility];  Service: Orthopedics;  Laterality: Right;  . THYROIDECTOMY  1990   [name]   Social History   Occupational History  . Occupation: Education officer, environmental: RETIRED  Tobacco Use  . Smoking status: Former Smoker    Quit date: 07/10/1965    Years since quitting: 55.2  . Smokeless tobacco: Never Used  . Tobacco comment: 3 cigs per week x 3 years (started in 1964), 12/27/15 does not smoke  Vaping Use  . Vaping Use: Never used  Substance and Sexual Activity  . Alcohol use: Yes    Alcohol/week: 3.0 standard drinks    Types: 3 Glasses of wine per week    Comment: occasionally  . Drug use: No  . Sexual activity: Not on file

## 2020-09-09 ENCOUNTER — Ambulatory Visit (INDEPENDENT_AMBULATORY_CARE_PROVIDER_SITE_OTHER): Payer: Medicare Other

## 2020-09-09 ENCOUNTER — Other Ambulatory Visit: Payer: Medicare Other | Admitting: *Deleted

## 2020-09-09 DIAGNOSIS — Z5181 Encounter for therapeutic drug level monitoring: Secondary | ICD-10-CM | POA: Diagnosis not present

## 2020-09-09 DIAGNOSIS — I480 Paroxysmal atrial fibrillation: Secondary | ICD-10-CM | POA: Diagnosis not present

## 2020-09-09 DIAGNOSIS — Z79899 Other long term (current) drug therapy: Secondary | ICD-10-CM | POA: Diagnosis not present

## 2020-09-09 DIAGNOSIS — Z7901 Long term (current) use of anticoagulants: Secondary | ICD-10-CM

## 2020-09-09 LAB — BASIC METABOLIC PANEL
BUN/Creatinine Ratio: 21 (ref 12–28)
BUN: 23 mg/dL (ref 8–27)
CO2: 20 mmol/L (ref 20–29)
Calcium: 10 mg/dL (ref 8.7–10.3)
Chloride: 98 mmol/L (ref 96–106)
Creatinine, Ser: 1.08 mg/dL — ABNORMAL HIGH (ref 0.57–1.00)
Glucose: 269 mg/dL — ABNORMAL HIGH (ref 65–99)
Potassium: 4.6 mmol/L (ref 3.5–5.2)
Sodium: 139 mmol/L (ref 134–144)
eGFR: 50 mL/min/{1.73_m2} — ABNORMAL LOW (ref >59)

## 2020-09-09 LAB — POCT INR: INR: 1.8 — AB (ref 2.0–3.0)

## 2020-09-09 LAB — MAGNESIUM: Magnesium: 1.7 mg/dL (ref 1.6–2.3)

## 2020-09-09 NOTE — Patient Instructions (Signed)
Description   Take 1.5 tablets (3mg ) today, then resume same dosage warfarin 1 tablet (2mg ) daily. Recheck INR in 2 weeks in the office.  Coumadin Clinic [phone].

## 2020-09-16 ENCOUNTER — Telehealth: Payer: Self-pay | Admitting: Pharmacist

## 2020-09-16 NOTE — Chronic Care Management (AMB) (Addendum)
Chronic Care Management Pharmacy Assistant   Name: Joanette Silveria  MRN: [id number] DOB: [birth year]  Reason for Encounter: Medication Review   Recent office visits:  None  Recent consult visits:  New medication -Magnesium Oxide 400 mg caps Change in Hydrochlorothiazide form 25 mg  To 12.5 daily 02.21.2022 [name], MD Cardiology 03.02.2022 [name], [place] [facility] visits:  None in previous 6 months  Medications: Outpatient Encounter Medications as of 09/16/2020  Medication Sig   anastrozole (ARIMIDEX) 1 MG tablet Take 1 tablet (1 mg total) by mouth daily.   Calcium Carbonate-Vitamin D 600-400 MG-UNIT tablet Take 1 tablet by mouth daily.   Cyanocobalamin (VITAMIN B 12) 500 MCG TABS Take 1,000 mcg by mouth daily.    diltiazem (CARDIZEM) 30 MG tablet Take 1 tablet every 4 hours AS NEEDED for afib heart rate >100 as long as top BP >100.   hydrochlorothiazide (MICROZIDE) 12.5 MG capsule Take 1 capsule (12.5 mg total) by mouth daily.   losartan (COZAAR) 100 MG tablet Take 100 mg by mouth daily.   Magnesium Oxide 400 MG CAPS Take 1 capsule (400 mg total) by mouth daily.   metoprolol tartrate (LOPRESSOR) 25 MG tablet Take 0.5 tablets (12.5 mg total) by mouth 2 (two) times daily.   Multiple Vitamins-Minerals (MULTIVITAMIN & MINERAL PO) Take 1 tablet by mouth daily.   Psyllium (METAMUCIL PO) Take by mouth as needed.   warfarin (COUMADIN) 2 MG tablet TAKE 1/2 TO 1 TAB BY MOUTH DAILY AS DIRECTED BY THE COUMADIN CLINIC   No facility-administered encounter medications on file as of 09/16/2020.   Reviewed chart for medication changes ahead of medication coordination call.  BP Readings from Last 3 Encounters:  08/30/20 (!) 154/80  08/13/20 122/76  07/19/20 118/80    Lab Results  Component Value Date   HGBA1C 7.4 (H) 06/11/2020     Patient obtains medications through Vials  30 Days  Last adherence delivery included:  Anastrozole (ARIMIDEX) 1 mg: one  tablet at dinner Calcium Carbonate-Vitamin D 600-400 mg-unit: one at breakfast Cyanocobalamin (VITAMIN B 12) 1000 mcg: one tablet at breakfast Losartan (COZAAR) 100 MG tablet: one tablet at breakfast Hydrochlorothiazide (HYDRODIURIL) 25 MG capsule: one tab at breakfast Metoprolol tartrate (LOPRESSOR) 25 mg:  tablet (12.5mg ) at breakfast and  at dinner Multiple Vitamins-Minerals: one tablet at breakfast Warfarin (Coumadin) 2 mg Take 1/2 a tablet to 1 tablet by mouth daily as directed by the coumadin clinic. (Vials)  Patient declined the following medication  last month due to PRN use. Diltiazem (CARDIZEM) 30 mg tablet PRN  Patient is due for next adherence delivery on: 09/18/2019. Called patient and reviewed medications and coordinated delivery. This delivery to include: Anastrozole (ARIMIDEX) 1 mg: one tablet at dinner Calcium Carbonate-Vitamin D 600-400 mg-unit: one at breakfast Cyanocobalamin (VITAMIN B 12) 1000 mcg: one tablet at breakfast Losartan (COZAAR) 100 MG tablet: one tablet at breakfast Hydrochlorothiazide (HYDRODIURIL) 12.5  MG capsule: one capsule at breakfast Metoprolol tartrate (LOPRESSOR) 25 mg:  tablet (12.5mg ) at breakfast and  at dinner Multiple Vitamins-Minerals: one tablet at breakfast Warfarin (Coumadin) 2 mg Take 1/2 tablet to 1 tablet by mouth daily as directed by the coumadin clinic.   Patient declined the following medications due to PRN use. Diltiazem (CARDIZEM) 30 mg tablet PRN  She currently does not need refills. Confirmed delivery date of 09/18/2019, advised patient that pharmacy will contact them the morning of delivery.   Maia Breslow, [place] Assistant [phone]

## 2020-09-23 ENCOUNTER — Other Ambulatory Visit: Payer: Self-pay

## 2020-09-23 ENCOUNTER — Ambulatory Visit (INDEPENDENT_AMBULATORY_CARE_PROVIDER_SITE_OTHER): Payer: Medicare Other | Admitting: *Deleted

## 2020-09-23 DIAGNOSIS — S82851K Displaced trimalleolar fracture of right lower leg, subsequent encounter for closed fracture with nonunion: Secondary | ICD-10-CM | POA: Diagnosis not present

## 2020-09-23 DIAGNOSIS — I480 Paroxysmal atrial fibrillation: Secondary | ICD-10-CM

## 2020-09-23 DIAGNOSIS — Z5181 Encounter for therapeutic drug level monitoring: Secondary | ICD-10-CM | POA: Diagnosis not present

## 2020-09-23 LAB — POCT INR: INR: 1.8 — AB (ref 2.0–3.0)

## 2020-09-23 NOTE — Patient Instructions (Signed)
Description   Take 1.5 tablets (3mg ) today, then resume same dosage warfarin 1 tablet (2mg ) daily. Recheck INR in 2 weeks in the office.  Coumadin Clinic [phone].

## 2020-09-29 ENCOUNTER — Ambulatory Visit: Payer: Medicare Other

## 2020-10-01 ENCOUNTER — Telehealth: Payer: Self-pay | Admitting: Pharmacist

## 2020-10-01 NOTE — Chronic Care Management (AMB) (Signed)
I left the patient a message about her upcoming appointment on 10/04/2020 @ 11:00 am with the clinical pharmacist. She was asked to please have all medication on hand to review with the pharmacist.   Cynthia Mathis) Mare Ferrari, [place] Assistant [phone]

## 2020-10-04 ENCOUNTER — Other Ambulatory Visit: Payer: Self-pay

## 2020-10-04 ENCOUNTER — Ambulatory Visit (INDEPENDENT_AMBULATORY_CARE_PROVIDER_SITE_OTHER): Payer: Medicare Other | Admitting: Pharmacist

## 2020-10-04 VITALS — BP 148/82

## 2020-10-04 DIAGNOSIS — I10 Essential (primary) hypertension: Secondary | ICD-10-CM

## 2020-10-04 DIAGNOSIS — E785 Hyperlipidemia, unspecified: Secondary | ICD-10-CM

## 2020-10-04 DIAGNOSIS — I480 Paroxysmal atrial fibrillation: Secondary | ICD-10-CM | POA: Diagnosis not present

## 2020-10-04 NOTE — Progress Notes (Signed)
Virtual Visit via Telephone Note   This visit type was conducted due to national recommendations for restrictions regarding the COVID-19 Pandemic (e.g. social distancing) in an effort to limit this patient's exposure and mitigate transmission in our community.  Due to her co-morbid illnesses, this patient is at least at moderate risk for complications without adequate follow up.  This format is felt to be most appropriate for this patient at this time.  The patient did not have access to video technology/had technical difficulties with video requiring transitioning to audio format only (telephone).  All issues noted in this document were discussed and addressed.  No physical exam could be performed with this format.  Please refer to the patient's chart for her  consent to telehealth for [facility].    Date:  10/06/2020   ID:  Cynthia Mathis, DOB [birth year], MRN [id number] The patient was identified using 2 identifiers.  Patient Location: Home Provider Location: Office/Clinic   PCP:  [name], [place]  Cardiologist:  Ena Dawley, MD  Advanced Practice Provider:  No care team member to display Electrophysiologist:  None   :[id number]}   Evaluation Performed:  Follow-Up Visit  Chief Complaint:  Follow up  History of Present Illness:    Cynthia Mathis is a 87 y.o. female with history of hypertension, HLD, PAF, NSVT, DM, memory loss.  Patient had transient loss of consciousness after standing after a bowel movement and straining in December and had a leg injury from this.  She had one prior episode of syncope after straining with a bowel movement.  2D echo showed normal LVEF otherwise unremarkable.  NST 08/27/2020 low risk no ischemia event monitor showed A. fib 8% as well as short NSVT.  No cause for syncope was identified.  Evaluated by Dr. Rayann Heman 08/30/2020 and he reduced HCTZ to 12.5 mg daily.  If blood pressure controlled at follow-up  he recommends stopping this drug.  A. fib rates were reasonably well controlled and she has as needed diltiazem to take for this.  Also is on metoprolol.  Visit with son and patient. She is doing well. Last Thurs her BP and P was over 100 and he gave her a prn diltiazem and both came down. This is the only time he has given her an extra. She had extra salt in her lunch that day eating chinese. Denies can dizziness or palpitations. BP running pretty normal at home.  The patient does not have symptoms concerning for COVID-19 infection (fever, chills, cough, or new shortness of breath).    Past Medical History:  Diagnosis Date  . Anxiety   . Borderline diabetes mellitus   . Cancer ([place]) 2020   breast  . Diverticulosis of colon   . DJD (degenerative joint disease)   . Dyslipidemia   . History of hematuria   . History of osteoporosis   . Hx of colonic polyps   . Hypertension   . Hypothyroid   . Macular degeneration    sees [name] for general eye care, sees [name] for retinal injections   . Memory loss   . NSVT (nonsustained ventricular tachycardia) ([place])   . Osteoporosis    took bisphosphonates, now off. Last DEXA in 2015 showed osteopenia   . Paroxysmal atrial fibrillation ([facility])    sees [name]   . SCCA (squamous cell carcinoma) of skin 12/11/2019   Left Hand Posterior (in situ)   Past Surgical History:  Procedure Laterality Date  . APPENDECTOMY  1947  . BREAST LUMPECTOMY WITH RADIOACTIVE SEED LOCALIZATION Right 05/14/2019   Procedure: RIGHT BREAST LUMPECTOMY WITH RADIOACTIVE SEED LOCALIZATION;  Surgeon: [name], MD;  Location: [facility];  Service: General;  Laterality: Right;  . CATARACT EXTRACTION W/PHACO Bilateral   . COLONOSCOPY  12/02/2014   per [name], severe diverticulosis but no polyps   . MASS EXCISION  04/17/2012   Procedure: EXCISION MASS;  Surgeon: [name], MD;  Location: [facility];  Service: General;  Laterality: N/A;   . ORIF ANKLE FRACTURE Right 06/14/2020   Procedure: OPEN REDUCTION INTERNAL FIXATION (ORIF) ANKLE FRACTURE;  Surgeon: [name], MD;  Location: [facility];  Service: Orthopedics;  Laterality: Right;  . THYROIDECTOMY  1990   [name]     Current Meds  Medication Sig  . anastrozole (ARIMIDEX) 1 MG tablet Take 1 tablet (1 mg total) by mouth daily.  . Calcium Carbonate-Vitamin D 600-400 MG-UNIT tablet Take 1 tablet by mouth daily.  . Cyanocobalamin (VITAMIN B 12) 500 MCG TABS Take 1,000 mcg by mouth daily.   [name] diltiazem (CARDIZEM) 30 MG tablet Take 1 tablet every 4 hours AS NEEDED for afib heart rate >100 as long as top BP >100.  . hydrochlorothiazide (MICROZIDE) 12.5 MG capsule Take 1 capsule (12.5 mg total) by mouth daily.  [name] losartan (COZAAR) 100 MG tablet Take 100 mg by mouth daily.  . Magnesium Oxide 400 MG CAPS Take 1 capsule (400 mg total) by mouth daily.  . metoprolol tartrate (LOPRESSOR) 25 MG tablet Take 0.5 tablets (12.5 mg total) by mouth 2 (two) times daily.  . Multiple Vitamins-Minerals (MULTIVITAMIN & MINERAL PO) Take 1 tablet by mouth daily.  . Psyllium (METAMUCIL PO) Take by mouth as needed.  . warfarin (COUMADIN) 2 MG tablet TAKE 1/2 TO 1 TAB BY MOUTH DAILY AS DIRECTED BY THE COUMADIN CLINIC     Allergies:   Epinephrine   Social History   Tobacco Use  . Smoking status: Former Smoker    Quit date: 07/10/1965    Years since quitting: 55.2  . Smokeless tobacco: Never Used  . Tobacco comment: 3 cigs per week x 3 years (started in 1964), 12/27/15 does not smoke  Vaping Use  . Vaping Use: Never used  Substance Use Topics  . Alcohol use: Yes    Alcohol/week: 3.0 standard drinks    Types: 3 Glasses of wine per week    Comment: occasionally  . Drug use: No     Family Hx: The patient's family history includes Stroke in her mother. There is no history of Colon cancer or Stomach cancer.  ROS:   Please see the history of present illness.      All other systems  reviewed and are negative.   Prior CV studies:   The following studies were reviewed today:  NST 08/27/2020    The left ventricular ejection fraction is hyperdynamic (>65%).  Nuclear stress EF: 78%.  There was no ST segment deviation noted during stress.  The study is normal.  This is a low risk study.   Low risk stress nuclear study with normal perfusion and normal left ventricular regional and global systolic function.      The left ventricular ejection fraction is hyperdynamic (>65%).  Nuclear stress EF: 78%.  There was no ST segment deviation noted during stress.  The study is normal.  This is a low risk study.   Low risk  stress nuclear study with normal perfusion and normal left ventricular regional and global systolic function.   2D echo 06/11/20   1. Left ventricular ejection fraction, by estimation, is 65 to 70%. The  left ventricle has normal function. The left ventricle has no regional  wall motion abnormalities. Left ventricular diastolic parameters are  indeterminate.   2. Right ventricular systolic function is normal. The right ventricular  size is normal. There is normal pulmonary artery systolic pressure.   3. The mitral valve is normal in structure. No evidence of mitral valve  regurgitation. No evidence of mitral stenosis.   4. The aortic valve is normal in structure. Aortic valve regurgitation is  trivial. No aortic stenosis is present.    Event monitor 07/2020  Predominant rhythm was sinus rhythm with minimum HR 47 and maximum HR of 101.  Atrial fibrillation was present for 8% of monitoring time with HR variability 85-144 BPM.  No pauses or AV blocks.  Several short runs of nsVT occured, the longest lasting 15 beats.   Predominant rhythm was sinus rhythm with minimum HR 47 and maximum HR of 101. Atrial fibrillation was present for 8% of monitoring time. Several short runs of nsVT occured, the longest lasting 15 beats. Symptoms didn't correlate  with any arrhythmias.   Labs/Other Tests and Data Reviewed:    EKG:  No ECG reviewed.  Recent Labs: 05/10/2020: ALT 22 06/19/2020: Hemoglobin 12.8; Platelets 267 08/13/2020: TSH 0.333 09/09/2020: BUN 23; Creatinine, Ser 1.08; Magnesium 1.7; Potassium 4.6; Sodium 139   Recent Lipid Panel Lab Results  Component Value Date/Time   CHOL 196 02/05/2019 08:52 AM   TRIG 186 (H) 02/05/2019 08:52 AM   HDL 50 02/05/2019 08:52 AM   CHOLHDL 3.9 02/05/2019 08:52 AM   CHOLHDL 4 02/13/2017 09:05 AM   LDLCALC 109 (H) 02/05/2019 08:52 AM    Wt Readings from Last 3 Encounters:  08/30/20 146 lb 3.2 oz (66.3 kg)  08/26/20 145 lb (65.8 kg)  08/13/20 145 lb 12.8 oz (66.1 kg)     Risk Assessment/Calculations:    CHA2DS2-VASc Score = 5  This indicates a 7.2% annual risk of stroke. The patient's score is based upon: CHF History: No HTN History: Yes Diabetes History: Yes Stroke History: No Vascular Disease History: No Age Score: 2 Gender Score: 1     Objective:    Vital Signs:  BP (!) 148/76   Pulse 69   Ht 5\' 3"  (1.6 m)   BMI 25.90 kg/m    VITAL SIGNS:  reviewed  ASSESSMENT & PLAN:    Syncope with collapse after straining during BM.  Felt to be vasovagal.  NST normal, 2D echo normal LVEF otherwise unremarkable, event monitor atrial for beat percent of time with NSVT.  Seen by Dr. Rayann Heman and not felt to be significant.  He decrease HCTZ to 12.5 mg daily and if controlled recommend stopping this drug.  PAF on metoprolol and Coumadin and has as needed diltiazem if needed -she took the first one on Thur for elevate HR and BP  NSVT felt to be benign and asymptomatic and did not contribute to her presyncope per Dr. Rayann Heman  Hypertension BP up and down on check today. Her son will keep track of it daily and send them to [place] via mychart. We can then decide if we need to stop HCTZ. She is going to [place] to stay with her daughter for a month or so.        COVID-19 Education: The  signs  and symptoms of COVID-19 were discussed with the patient and how to seek care for testing (follow up with PCP or arrange E-visit).   The importance of social distancing was discussed today.  Time:   Today, I have spent 12:25 minutes with the patient with telehealth technology discussing the above problems.     Medication Adjustments/Labs and Tests Ordered: Current medicines are reviewed at length with the patient today.  Concerns regarding medicines are outlined above.   Tests Ordered: No orders of the defined types were placed in this encounter.   Medication Changes: No orders of the defined types were placed in this encounter.   Follow Up:  In Person in 3 month(s) Dr. Johney Frame  Signed, Ermalinda Barrios, PA-C  10/06/2020 10:21 AM    [place]

## 2020-10-04 NOTE — Progress Notes (Signed)
Chronic Care Management Pharmacy Note  10/07/2020 Name:  Cynthia Mathis MRN:  [id number] DOB:  [birth year]  Subjective: Cynthia Mathis is an 87 y.o. year old female who is a primary patient of Cynthia Morale, MD.  The CCM team was consulted for assistance with disease management and care coordination needs.    Engaged with patient face to face for follow up visit in response to provider referral for pharmacy case management and/or care coordination services.   Consent to Services:  The patient was given information about Chronic Care Management services, agreed to services, and gave verbal consent prior to initiation of services.  Please see initial visit note for detailed documentation.   Patient Care Team: Cynthia Morale, MD as PCP - General (Family Medicine) Cynthia Spark, MD as PCP - Cardiology (Cardiology) Cynthia, [name], MD as Consulting Physician (Oncology) Cynthia Kussmaul, MD as Consulting Physician (General Surgery) Cynthia Kaufmann, RN as Oncology Nurse Navigator Cynthia Spark, MD as Consulting Physician (Cardiology) Cynthia Napoleon, PA-C (Inactive) (Dermatology) Cynthia Mathis, [facility] as Pharmacist (Pharmacist)  Recent office visits: 07/19/20 Cynthia Penna, MD: Patient presented for hospital follow up. Increased metoprolol 12.5 mg to BID and stopped fish oil, memantine and glucosamine. Influenza vaccine administered.  Recent consult visits: 09/23/20 Cynthia Mathis (cardiology): Patient presented for anti-coag visit. INR 1.8, goal 2-3. Boosted dose x 1 and then continued 1 tablet daily.  09/08/20 Cynthia Shown, MD (ortho): Patient presented for follow up from ankle fracture. Plan for outpatient PT.  08/30/20 Cynthia Grayer, MD (cardiology): Patient presented for electrophysiology evaluation. Decreased HCTZ to 12.5 mg daily.  08/13/20 Cynthia Mathis (cardiology): Patient presented for event monitor follow up.   07/28/20 Cynthia Shown, MD (ortho): Patient  presented for follow up from ankle fracture. Patient is getting home health PT twice weekly.  07/13/20 Cynthia Lair, MD (ophthalomology): Patient presented for macular degeneration follow up. Avastin injection given.  07/07/21 Cynthia Shown, MD (ortho): Patient presented for follow up from ankle fracture. Sutures removed and steri strips applied.  07/06/20 Cynthia Palau, NP (cardiology): Patient presented for Afib follow up.   06/07/20 Cynthia Mathis (ophthalomology): Unable to access notes.  05/13/20 Cynthia Dark, MD (dermatology): Patient presented for history of squamous cell carcinoma and actinic keratosis. Cryotherapy completed.  05/10/20 Cynthia Del, MD (oncology): Patient presented for breast cancer follow up. Continues anastrazole and plan for 5 years of therapy (started Dec 2020).   Hospital visits: 12/2-12/11/21 Patient admitted for Afib episide and syncope with fracture of her ankle. Discharged to rehab facility.  04/19/20 Patient presented to the ED for Afib.  Objective:  Lab Results  Component Value Date   CREATININE 1.08 (H) 09/09/2020   BUN 23 09/09/2020   GFR 59.66 (L) 02/13/2018   GFRNONAA 41 (L) 08/26/2020   GFRAA 47 (L) 08/26/2020   NA 139 09/09/2020   K 4.6 09/09/2020   CALCIUM 10.0 09/09/2020   CO2 20 09/09/2020   GLUCOSE 269 (H) 09/09/2020    Lab Results  Component Value Date/Time   HGBA1C 7.4 (H) 06/11/2020 03:33 AM   HGBA1C 6.6 (H) 02/12/2019 09:05 AM   GFR 59.66 (L) 02/13/2018 12:36 PM   GFR 67.09 02/13/2017 09:05 AM    Last diabetic Eye exam:  Lab Results  Component Value Date/Time   HMDIABEYEEXA No Retinopathy 06/07/2020 12:00 AM    Last diabetic Foot exam: No results found for: HMDIABFOOTEX   Lab Results  Component Value Date   CHOL 196 02/05/2019  HDL 50 02/05/2019   LDLCALC 109 (H) 02/05/2019   TRIG 186 (H) 02/05/2019   CHOLHDL 3.9 02/05/2019    Hepatic Function Latest Ref Rng & Units 05/10/2020 09/10/2019 06/12/2019  Total Protein  6.5 - 8.1 g/dL 7.0 7.2 7.0  Albumin 3.5 - 5.0 g/dL 3.7 4.1 3.9  AST 15 - 41 U/L '21 22 24  ' ALT 0 - 44 U/L '22 23 24  ' Alk Phosphatase 38 - 126 U/L 60 71 65  Total Bilirubin 0.3 - 1.2 mg/dL 0.6 0.6 0.6  Bilirubin, Direct 0.0 - 0.3 mg/dL - - -    Lab Results  Component Value Date/Time   TSH 0.333 (L) 08/13/2020 02:51 PM   TSH 0.319 (L) 06/11/2020 03:33 AM   TSH 0.32 (L) 02/12/2019 09:05 AM   FREET4 1.13 (H) 06/11/2020 03:33 AM   FREET4 1.00 02/12/2019 09:05 AM    CBC Latest Ref Rng & Units 06/19/2020 06/18/2020 06/17/2020  WBC 4.0 - 10.5 K/uL 11.9(H) 12.5(H) 12.2(H)  Hemoglobin 12.0 - 15.0 g/dL 12.8 13.2 13.0  Hematocrit 36.0 - 46.0 % 37.4 38.7 39.9  Platelets 150 - 400 K/uL 267 270 228    Lab Results  Component Value Date/Time   VD25OH 52.64 06/12/2019 01:29 PM   VD25OH 43.94 08/12/2015 08:47 AM   VD25OH 54 08/04/2013 10:52 AM    Clinical ASCVD: No  The ASCVD Risk score [name] DC Jr., et al., 2013) failed to calculate for the following reasons:   The 2013 ASCVD risk score is only valid for ages 37 to 48    Depression screen PHQ 2/9 02/09/2016  Decreased Interest 0  Down, Depressed, Hopeless 0  PHQ - 2 Score 0  Some recent data might be hidden    CHA2DS2/VAS Stroke Risk Points  Current as of 21 minutes ago     5 >= 2 Points: High Risk  1 - 1.99 Points: Medium Risk  0 Points: Low Risk    No Change      Details    This score determines the patient's risk of having a stroke if the  patient has atrial fibrillation.       Points Metrics  0 Has Congestive Heart Failure:  No    Current as of 21 minutes ago  0 Has Vascular Disease:  No    Current as of 21 minutes ago  1 Has Hypertension:  Yes    Current as of 21 minutes ago  2 Age:  56    Current as of 21 minutes ago  1 Has Diabetes:  Yes     Current as of 21 minutes ago  0 Had Stroke:  No  Had TIA:  No  Had Thromboembolism:  No    Current as of 21 minutes ago  1 Female:  Yes    Current as of 21 minutes ago      Social History   Tobacco Use  Smoking Status Former Smoker  . Quit date: 07/10/1965  . Years since quitting: 55.2  Smokeless Tobacco Never Used  Tobacco Comment   3 cigs per week x 3 years (started in 1964), 12/27/15 does not smoke   BP Readings from Last 3 Encounters:  10/06/20 (!) 148/76  10/04/20 (!) 148/82  08/30/20 (!) 154/80   Pulse Readings from Last 3 Encounters:  10/06/20 69  08/30/20 75  08/13/20 71   Wt Readings from Last 3 Encounters:  08/30/20 146 lb 3.2 oz (66.3 kg)  08/26/20 145 lb (65.8 kg)  08/13/20  145 lb 12.8 oz (66.1 kg)   BMI Readings from Last 3 Encounters:  10/06/20 25.90 kg/m  08/30/20 25.90 kg/m  08/26/20 25.69 kg/m    Assessment/Interventions: Review of patient past medical history, allergies, medications, health status, including review of consultants reports, laboratory and other test data, was performed as part of comprehensive evaluation and provision of chronic care management services.   SDOH:  (Social Determinants of Health) assessments and interventions performed: No  SDOH Screenings   Alcohol Screen: Not on file  Depression (GDJ2-4): Not on file  Financial Resource Strain: Low Risk   . Difficulty of Paying Living Expenses: Not hard at all  Food Insecurity: Not on file  Housing: Not on file  Physical Activity: Not on file  Social Connections: Not on file  Stress: Not on file  Tobacco Use: Medium Risk  . Smoking Tobacco Use: Former Smoker  . Smokeless Tobacco Use: Never Used  Transportation Needs: No Transportation Needs  . Lack of Transportation (Medical): No  . Lack of Transportation (Non-Medical): No    CCM Care Plan  Allergies  Allergen Reactions  . Epinephrine Palpitations    heart racing    Medications Reviewed Today    Reviewed by Disher, Ludger Nutting, CMA (Certified Medical Assistant) on 10/06/20 at [phone]  Med List Status: <None>  Medication Order Taking? Sig Documenting Provider Last Dose Status Informant   anastrozole (ARIMIDEX) 1 MG tablet [id number] Yes Take 1 tablet (1 mg total) by mouth daily. Cynthia, [name], MD Taking Active Self  Calcium Carbonate-Vitamin D 600-400 MG-UNIT tablet [id number] Yes Take 1 tablet by mouth daily. [provider] Taking Active Self  Cyanocobalamin (VITAMIN B 12) 500 MCG TABS [id number] Yes Take 1,000 mcg by mouth daily.  [provider] Taking Active Self  diltiazem (CARDIZEM) 30 MG tablet [id number] Yes Take 1 tablet every 4 hours AS NEEDED for afib heart rate >100 as long as top BP >100. Fenton, Clint R, PA Taking Active   hydrochlorothiazide (MICROZIDE) 12.5 MG capsule [id number] Yes Take 1 capsule (12.5 mg total) by mouth daily. Cynthia Grayer, MD Taking Active   losartan (COZAAR) 100 MG tablet [id number] Yes Take 100 mg by mouth daily. [provider] Taking Active   Magnesium Oxide 400 MG CAPS [id number] Yes Take 1 capsule (400 mg total) by mouth daily. Charlie Pitter, PA-C Taking Active   metoprolol tartrate (LOPRESSOR) 25 MG tablet [id number] Yes Take 0.5 tablets (12.5 mg total) by mouth 2 (two) times daily. Cynthia Morale, MD Taking Active   Multiple Vitamins-Minerals (MULTIVITAMIN & MINERAL PO) [id number] Yes Take 1 tablet by mouth daily. [provider] Taking Active Self  Psyllium (METAMUCIL PO) [id number] Yes Take by mouth as needed. [provider] Taking Active   warfarin (COUMADIN) 2 MG tablet [id number] Yes TAKE 1/2 TO 1 TAB BY MOUTH DAILY AS DIRECTED BY THE COUMADIN CLINIC Cynthia Spark, MD Taking Active           Patient Active Problem List   Diagnosis Date Noted  . Long term (current) use of anticoagulants 07/23/2020  . Closed displaced trimalleolar fracture of right ankle 06/10/2020  . Leukocytosis 06/10/2020  . Bradycardia 06/10/2020  . Syncope 06/10/2020  . Aortic atherosclerosis ([place]) 05/10/2020  . Secondary hypercoagulable state ([place]) 04/23/2020  . Posterior vitreous detachment of right eye  12/02/2019  . Exudative age-related macular degeneration of left eye with active choroidal neovascularization ([place]) 10/28/2019  . Exudative age-related macular degeneration of right eye with active choroidal  neovascularization ([place]) 10/28/2019  . Retinal hemorrhage of left eye 10/28/2019  . Malignant neoplasm of upper-outer quadrant of right breast in female, estrogen receptor positive ([place]) 03/26/2019  . Mild cognitive impairment 03/28/2018  . Foot pain, right 12/05/2017  . Hx of colonic polyps   . Diverticulosis of colon   . Anxiety   . Paroxysmal atrial fibrillation ([place]) 08/24/2016  . Low back pain 05/06/2015  . Osteopenia 08/04/2013  . Memory loss 09/17/2012  . 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 Macular degeneration (senile) of retina 08/12/2008  . Osteoarthritis 08/12/2008  . Impaired fasting glucose 02/10/2008  . Hypothyroidism 08/08/2007  . Dyslipidemia 08/08/2007  . Essential hypertension 08/08/2007    Immunization History  Administered Date(s) Administered  . DTP 04/17/2000  . Fluad Quad(high Dose 65+) 07/19/2020  . Influenza Split 05/20/2011, 05/15/2012  . Influenza Whole 05/02/2010  . Influenza, High Dose Seasonal PF 05/08/2013, 05/25/2017, 05/09/2018, 05/09/2018, 05/23/2019  . Influenza,inj,Quad PF,6+ Mos 05/25/2014, 06/29/2015, 06/13/2016  . Influenza,inj,quad, With Preservative 05/09/2018  . PFIZER(Purple Top)SARS-COV-2 Vaccination 08/31/2019, 09/24/2019, 05/29/2020  . Pneumococcal Conjugate-13 08/15/2017  . Pneumococcal Polysaccharide-23 04/17/2000  . Tdap 08/04/2013  . Zoster Recombinat (Shingrix) 05/23/2019, 08/15/2019   Patient presented to the office visit with her son [name]. Patient is planning on traveling to [place] for 10 days and wanted to double check that it was ok for them to do so. Will reach out to Dr. Sarajane Jews to make him aware.  Conditions to be addressed/monitored:  Hypertension, Hyperlipidemia, Atrial Fibrillation, Hypothyroidism,  Osteopenia, Osteoarthritis and mild cognitive impairment  Care Plan : CCM Pharmacy Care Plan  Updates made by Cynthia Mathis, [place] since 10/07/2020 12:00 AM    Problem: Problem: Hypertension, Hyperlipidemia, Atrial Fibrillation, Hypothyroidism, Osteopenia, Osteoarthritis and mild cognitive impairment     Long-Range Goal: Patient-Specific Goal   Start Date: 10/04/2020  Expected End Date: 10/04/2021  This Visit's Progress: On track  Priority: High  Note:   Current Barriers:  . Unable to independently monitor therapeutic efficacy . Unable to achieve control of blood pressure   Pharmacist Clinical Goal(s):  [name] Patient will achieve adherence to monitoring guidelines and medication adherence to achieve therapeutic efficacy . achieve control of blood pressure as evidenced by blood pressure readings  through collaboration with PharmD and provider.   Interventions: . 1:1 collaboration with Cynthia Morale, MD regarding development and update of comprehensive plan of care as evidenced by provider attestation and co-signature . Inter-disciplinary care team collaboration (see longitudinal plan of care) . Comprehensive medication review performed; medication list updated in electronic medical record  Hypertension (BP goal <140/90) -Uncontrolled -Current treatment: . Losartan 100 mg 1 tablet daily - in AM . Hydrochlorothiazide 12.5 mg 1 capsule daily - in AM . Metoprolol tartrate 25 mg 1/2 tablet twice daily -Medications previously tried: Metoprolol succinate (bradycardia) -Current home readings: could not provide readings -Current dietary habits: patient eats frozen meals for dinner and lots of sandwiches -Current exercise habits: mostly sedentary -Denies hypotensive/hypertensive symptoms -Educated on Exercise goal of 150 minutes per week; Importance of home blood pressure monitoring; Proper BP monitoring technique; -Counseled to monitor BP at home weekly, document, and provide log at future  appointments -Counseled on diet and exercise extensively Recommended to continue current medication  Hyperlipidemia: (LDL goal < 100) -Uncontrolled -Current treatment: . No medications -Medications previously tried: Atorvastatin (no longer needed), fish oil (no longer needed) -Current dietary patterns: doesn't eat out as often -Current exercise habits: mostly sedentary -Educated on Cholesterol goals;  Importance of limiting  foods high in cholesterol; Exercise goal of 150 minutes per week; -Recommended repeat lipid panel  Atrial Fibrillation (Goal: prevent stroke and major bleeding) -Controlled -CHADSVASC: 5 -Current treatment: . Rate control: metoprolol tartrate 12.5 mg twice daily, cardizem 30 mg 1 tablet as needed for HR > 100 bpm . Anticoagulation: warfarin 2 mg 1 tablet daily or as directed by coumadin clinic -Medications previously tried: metoprolol succinate (bradycardia) -Home BP and HR readings: could not provide readings but does report symptoms when it is low or skips a beat  -Counseled on bleeding risk associated with warfarin and importance of self-monitoring for signs/symptoms of bleeding; avoidance of NSAIDs due to increased bleeding risk with anticoagulants; -Recommended to continue current medication  Bone health (Goal prevent fractures) -Controlled -Last DEXA Scan: 06/16/19              T-Score femoral neck: -0.7             T-Score total hip: -0.4             T-Score lumbar spine: +02             T-Score forearm radius: n/a             10-year probability of major osteoporotic fracture: n/a             10-year probability of hip fracture: n/a -Patient is not a candidate for pharmacologic treatment -Current treatment  . Calcium Carbonate + D 600 mg -400 units daily  -Medications previously tried: none  -Recommend [phone] units of vitamin D daily. Recommend 1200 mg of calcium daily from dietary and supplemental sources. Recommend weight-bearing and muscle  strengthening exercises for building and maintaining bone density. -Recommended to continue current medication  Memory loss (Goal: prevent rapid worsening of memory) -Not ideally controlled -Current treatment  . No medications -Medications previously tried: memantine, donepezil (not effective) -Recommended to continue as is  History of breast cancer (Goal: remission of breast cancer) -Controlled -Current treatment  . Anastrazole 44m daily  -Medications previously tried: none  -Recommended to continue current medication Plan for 5 years of treatment and it was started 06/2019.  Low magnesium (Goal: 1.3-2.1) -Uncontrolled -Current treatment  . Magnesium oxide 400 mg 1 capsule daily -Medications previously tried: none  -Recommended to continue current medication   Health Maintenance -Vaccine gaps: none -Current therapy:  . Multivitamin 1 tablet daily . Vitamin B12 500 mcg 1 tablet daily . Metamucil as needed -Educated on Cost vs benefit of each product must be carefully weighed by individual consumer -Patient is satisfied with current therapy and denies issues -Recommended to continue current medication  Patient Goals/Self-Care Activities . Patient will:  - take medications as prescribed check blood pressure weekly, document, and provide at future appointments engage in dietary modifications by limiting salt intake and adding more fiber to her diet  Follow Up Plan: Face to Face appointment with care management team member scheduled for:  6 months       Medication Assistance: None required.  Patient affirms current coverage meets needs.  Patient[facility]:  [facility] [address]Phone: [phone]: [phone] [facility] [place] [facility] [address] [place]Phone: [phone]:  [phone] [place] [place]  Phone: [phone] Fax: [phone]  Uses pill box? Yes Pt endorses 100% compliance  We discussed: Benefits of medication synchronization, packaging and delivery as well as enhanced pharmacist oversight with [facility]. Patient decided to: Utilize [facility] pharmacy for medication synchronization, packaging and delivery  Care Plan and Follow Up Patient Decision:  Patient agrees to Care Plan and Follow-up.  Plan: Face to Face appointment with care management team member scheduled for: 6 months  Jeni Salles, PharmD [place] Pharmacist [place] [facility] [place] [phone]

## 2020-10-06 ENCOUNTER — Telehealth (INDEPENDENT_AMBULATORY_CARE_PROVIDER_SITE_OTHER): Payer: Medicare Other | Admitting: Physician Assistant

## 2020-10-06 ENCOUNTER — Encounter: Payer: Self-pay | Admitting: Physician Assistant

## 2020-10-06 ENCOUNTER — Telehealth: Payer: Self-pay

## 2020-10-06 VITALS — BP 148/76 | HR 69 | Ht 63.0 in

## 2020-10-06 DIAGNOSIS — I10 Essential (primary) hypertension: Secondary | ICD-10-CM

## 2020-10-06 DIAGNOSIS — I472 Ventricular tachycardia: Secondary | ICD-10-CM | POA: Diagnosis not present

## 2020-10-06 DIAGNOSIS — I480 Paroxysmal atrial fibrillation: Secondary | ICD-10-CM | POA: Diagnosis not present

## 2020-10-06 DIAGNOSIS — R55 Syncope and collapse: Secondary | ICD-10-CM

## 2020-10-06 NOTE — Patient Instructions (Addendum)
Medication Instructions:  Your physician recommends that you continue on your current medications as directed. Please refer to the Current Medication list given to you today. *If you need a refill on your cardiac medications before your next appointment, please call your pharmacy*   Lab Work: None If you have labs (blood work) drawn today and your tests are completely normal, you will receive your results only by: [name] MyChart Message (if you have MyChart) OR . A paper copy in the mail If you have any lab test that is abnormal or we need to change your treatment, we will call you to review the results.   Testing/Procedures: None   Follow-Up: At [facility], you and your health needs are our priority.  As part of our continuing mission to provide you with exceptional heart care, we have created designated Provider Care Teams.  These Care Teams include your primary Cardiologist (physician) and Advanced Practice Providers (APPs -  Physician Assistants and Nurse Practitioners) who all work together to provide you with the care you need, when you need it.  Your next appointment:   3 month(s) 01/19/21 at 9:00 AM  The format for your next appointment:   In Person  Provider:   Gwyndolyn Kaufman, MD   Other Instructions Keep a log of your blood pressure readings for a week and a half and call us with those readings or send them through your MyChart.

## 2020-10-06 NOTE — Telephone Encounter (Signed)
  Patient Consent for Virtual Visit         Cynthia Mathis has provided verbal consent on 10/06/2020 for a virtual visit (video or telephone).   CONSENT FOR VIRTUAL VISIT FOR:  Cynthia Mathis  By participating in this virtual visit I agree to the following:  I hereby voluntarily request, consent and authorize [facility] and its employed or contracted physicians, Engineer, materials, nurse practitioners or other licensed health care professionals (the Practitioner), to provide me with telemedicine health care services (the "Services") as deemed necessary by the treating Practitioner. I acknowledge and consent to receive the Services by the Practitioner via telemedicine. I understand that the telemedicine visit will involve communicating with the Practitioner through live audiovisual communication technology and the disclosure of certain medical information by electronic transmission. I acknowledge that I have been given the opportunity to request an in-person assessment or other available alternative prior to the telemedicine visit and am voluntarily participating in the telemedicine visit.  I understand that I have the right to withhold or withdraw my consent to the use of telemedicine in the course of my care at any time, without affecting my right to future care or treatment, and that the Practitioner or I may terminate the telemedicine visit at any time. I understand that I have the right to inspect all information obtained and/or recorded in the course of the telemedicine visit and may receive copies of available information for a reasonable fee.  I understand that some of the potential risks of receiving the Services via telemedicine include:  [name] Delay or interruption in medical evaluation due to technological equipment failure or disruption; . Information transmitted may not be sufficient (e.g. poor resolution of images) to allow for appropriate medical decision making by the  Practitioner; and/or  . In rare instances, security protocols could fail, causing a breach of personal health information.  Furthermore, I acknowledge that it is my responsibility to provide information about my medical history, conditions and care that is complete and accurate to the best of my ability. I acknowledge that Practitioner's advice, recommendations, and/or decision may be based on factors not within their control, such as incomplete or inaccurate data provided by me or distortions of diagnostic images or specimens that may result from electronic transmissions. I understand that the practice of medicine is not an exact science and that Practitioner makes no warranties or guarantees regarding treatment outcomes. I acknowledge that a copy of this consent can be made available to me via my patient portal ([place]), or I can request a printed copy by calling the office of [facility].    I understand that my insurance will be billed for this visit.   I have read or had this consent read to me. . I understand the contents of this consent, which adequately explains the benefits and risks of the Services being provided via telemedicine.  . I have been provided ample opportunity to ask questions regarding this consent and the Services and have had my questions answered to my satisfaction. . I give my informed consent for the services to be provided through the use of telemedicine in my medical care

## 2020-10-07 ENCOUNTER — Ambulatory Visit (INDEPENDENT_AMBULATORY_CARE_PROVIDER_SITE_OTHER): Payer: Medicare Other | Admitting: *Deleted

## 2020-10-07 ENCOUNTER — Other Ambulatory Visit: Payer: Self-pay

## 2020-10-07 DIAGNOSIS — I480 Paroxysmal atrial fibrillation: Secondary | ICD-10-CM | POA: Diagnosis not present

## 2020-10-07 DIAGNOSIS — Z5181 Encounter for therapeutic drug level monitoring: Secondary | ICD-10-CM

## 2020-10-07 DIAGNOSIS — Z7901 Long term (current) use of anticoagulants: Secondary | ICD-10-CM | POA: Diagnosis not present

## 2020-10-07 LAB — POCT INR: INR: 2 (ref 2.0–3.0)

## 2020-10-07 NOTE — Patient Instructions (Signed)
Hi [name] and [name]!  It was great to get to meet you both in person! Below is a summary of some of the topics we discussed.   Please reach out to me if you have any questions or need anything before our follow up!  Best, Maddie  Jeni Salles, PharmD, [facility]  Visit Information  Goals Addressed   None    Patient Care Plan: CCM Pharmacy Care Plan    Problem Identified: Problem: Hypertension, Hyperlipidemia, Atrial Fibrillation, Hypothyroidism, Osteopenia, Osteoarthritis and mild cognitive impairment     Long-Range Goal: Patient-Specific Goal   Start Date: 10/04/2020  Expected End Date: 10/04/2021  This Visit's Progress: On track  Priority: High  Note:   Current Barriers:  . Unable to independently monitor therapeutic efficacy . Unable to achieve control of blood pressure   Pharmacist Clinical Goal(s):  [name] Patient will achieve adherence to monitoring guidelines and medication adherence to achieve therapeutic efficacy . achieve control of blood pressure as evidenced by blood pressure readings  through collaboration with PharmD and provider.   Interventions: . 1:1 collaboration with [name], MD regarding development and update of comprehensive plan of care as evidenced by provider attestation and co-signature . Inter-disciplinary care team collaboration (see longitudinal plan of care) . Comprehensive medication review performed; medication list updated in electronic medical record  Hypertension (BP goal <140/90) -Uncontrolled -Current treatment: . Losartan 100 mg 1 tablet daily - in AM . Hydrochlorothiazide 12.5 mg 1 capsule daily - in AM . Metoprolol tartrate 25 mg 1/2 tablet twice daily -Medications previously tried: Metoprolol succinate (bradycardia) -Current home readings: could not provide readings -Current dietary habits: patient eats frozen meals for dinner and lots of sandwiches -Current  exercise habits: mostly sedentary -Denies hypotensive/hypertensive symptoms -Educated on Exercise goal of 150 minutes per week; Importance of home blood pressure monitoring; Proper BP monitoring technique; -Counseled to monitor BP at home weekly, document, and provide log at future appointments -Counseled on diet and exercise extensively Recommended to continue current medication  Hyperlipidemia: (LDL goal < 100) -Uncontrolled -Current treatment: . No medications -Medications previously tried: Atorvastatin (no longer needed), fish oil (no longer needed) -Current dietary patterns: doesn't eat out as often -Current exercise habits: mostly sedentary -Educated on Cholesterol goals;  Importance of limiting foods high in cholesterol; Exercise goal of 150 minutes per week; -Recommended repeat lipid panel  Atrial Fibrillation (Goal: prevent stroke and major bleeding) -Controlled -CHADSVASC: 5 -Current treatment: . Rate control: metoprolol tartrate 12.5 mg twice daily, cardizem 30 mg 1 tablet as needed for HR > 100 bpm . Anticoagulation: warfarin 2 mg 1 tablet daily or as directed by coumadin clinic -Medications previously tried: metoprolol succinate (bradycardia) -Home BP and HR readings: could not provide readings but does report symptoms when it is low or skips a beat  -Counseled on bleeding risk associated with warfarin and importance of self-monitoring for signs/symptoms of bleeding; avoidance of NSAIDs due to increased bleeding risk with anticoagulants; -Recommended to continue current medication  Bone health (Goal prevent fractures) -Controlled -Last DEXA Scan: 06/16/19              T-Score femoral neck: -0.7             T-Score total hip: -0.4             T-Score lumbar spine: +02             T-Score forearm radius: n/a  10-year probability of major osteoporotic fracture: n/a             10-year probability of hip fracture: n/a -Patient is not a candidate for  pharmacologic treatment -Current treatment  . Calcium Carbonate + D 600 mg -400 units daily  -Medications previously tried: none  -Recommend [phone] units of vitamin D daily. Recommend 1200 mg of calcium daily from dietary and supplemental sources. Recommend weight-bearing and muscle strengthening exercises for building and maintaining bone density. -Recommended to continue current medication  Memory loss (Goal: prevent rapid worsening of memory) -Not ideally controlled -Current treatment  . No medications -Medications previously tried: memantine, donepezil (not effective) -Recommended to continue as is  History of breast cancer (Goal: remission of breast cancer) -Controlled -Current treatment  . Anastrazole 1mg  daily  -Medications previously tried: none  -Recommended to continue current medication Plan for 5 years of treatment and it was started 06/2019.  Low magnesium (Goal: 1.3-2.1) -Uncontrolled -Current treatment  . Magnesium oxide 400 mg 1 capsule daily -Medications previously tried: none  -Recommended to continue current medication   Health Maintenance -Vaccine gaps: none -Current therapy:  . Multivitamin 1 tablet daily . Vitamin B12 500 mcg 1 tablet daily . Metamucil as needed -Educated on Cost vs benefit of each product must be carefully weighed by individual consumer -Patient is satisfied with current therapy and denies issues -Recommended to continue current medication  Patient Goals/Self-Care Activities . Patient will:  - take medications as prescribed check blood pressure weekly, document, and provide at future appointments engage in dietary modifications by limiting salt intake and adding more fiber to her diet  Follow Up Plan: Face to Face appointment with care management team member scheduled for:  6 months      Ms. Shiner was given information about Chronic Care Management services today including:  1. CCM service includes personalized support  from designated clinical staff supervised by her physician, including individualized plan of care and coordination with other care providers 2. 24/7 contact phone numbers for assistance for urgent and routine care needs. 3. Standard insurance, coinsurance, copays and deductibles apply for chronic care management only during months in which we provide at least 20 minutes of these services. Most insurances cover these services at 100%, however patients may be responsible for any copay, coinsurance and/or deductible if applicable. This service may help you avoid the need for more expensive face-to-face services. 4. Only one practitioner may furnish and bill the service in a calendar month. 5. The patient may stop CCM services at any time (effective at the end of the month) by phone call to the office staff.  Patient agreed to services and verbal consent obtained.   Patient verbalizes understanding of instructions provided today and agrees to view in [address].  Face to Face appointment with pharmacist scheduled for: 6 months  [name], [facility]

## 2020-10-07 NOTE — Patient Instructions (Signed)
Description   Continue taking Warfarin 1 tablet (2mg ) daily. Recheck INR in 3 weeks in [place], [place].  Coumadin Clinic [phone].

## 2020-10-12 ENCOUNTER — Encounter (INDEPENDENT_AMBULATORY_CARE_PROVIDER_SITE_OTHER): Payer: Self-pay | Admitting: Ophthalmology

## 2020-10-12 ENCOUNTER — Ambulatory Visit (INDEPENDENT_AMBULATORY_CARE_PROVIDER_SITE_OTHER): Payer: Medicare Other | Admitting: Ophthalmology

## 2020-10-12 ENCOUNTER — Other Ambulatory Visit: Payer: Self-pay

## 2020-10-12 DIAGNOSIS — H353211 Exudative age-related macular degeneration, right eye, with active choroidal neovascularization: Secondary | ICD-10-CM | POA: Diagnosis not present

## 2020-10-12 DIAGNOSIS — H353221 Exudative age-related macular degeneration, left eye, with active choroidal neovascularization: Secondary | ICD-10-CM | POA: Diagnosis not present

## 2020-10-12 DIAGNOSIS — H353133 Nonexudative age-related macular degeneration, bilateral, advanced atrophic without subfoveal involvement: Secondary | ICD-10-CM | POA: Insufficient documentation

## 2020-10-12 MED ORDER — BEVACIZUMAB 2.5 MG/0.1ML IZ SOSY
2.5000 mg | PREFILLED_SYRINGE | INTRAVITREAL | Status: AC | PRN
  Administered 2020-10-12: 2.5 mg via INTRAVITREAL

## 2020-10-12 NOTE — Assessment & Plan Note (Signed)
Overall stable however at 62-month follow-up, the region superior portion of the macula.  We will repeat today injection Avastin to maintain and stabilize and prevent hemorrhagic progression

## 2020-10-12 NOTE — Assessment & Plan Note (Signed)
Now off therapy for 6 months thus 4.5 months without therapy.  Stable OS, no recurrence of CNVM will continue to observe

## 2020-10-12 NOTE — Assessment & Plan Note (Signed)
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

## 2020-10-12 NOTE — Progress Notes (Signed)
10/12/2020     CHIEF COMPLAINT Patient presents for Retina Follow Up (3 Month Wet AMD f\u. Possible Avastin OD. OCT/Pt states vision is mostly stable. Denies floaters and FOL.)   HISTORY OF PRESENT ILLNESS: Cynthia Mathis is a 87 y.o. female who presents to the clinic today for:   HPI    Retina Follow Up    Patient presents with  Wet AMD.  In right eye.  Severity is moderate.  Duration of 3 weeks.  Since onset it is stable.  I, the attending physician,  performed the HPI with the patient and updated documentation appropriately. Additional comments: 3 Month Wet AMD f\u. Possible Avastin OD. OCT Pt states vision is mostly stable. Denies floaters and FOL.       Last edited by Tilda Franco on 10/12/2020 10:39 AM. (History)      Referring physician: [name], MD [place],  [place] [phone]  HISTORICAL INFORMATION:   Selected notes from the MEDICAL RECORD NUMBER    Lab Results  Component Value Date   HGBA1C 7.4 (H) 06/11/2020     CURRENT MEDICATIONS: No current outpatient medications on file. (Ophthalmic Drugs)   No current facility-administered medications for this visit. (Ophthalmic Drugs)   Current Outpatient Medications (Other)  Medication Sig  . anastrozole (ARIMIDEX) 1 MG tablet Take 1 tablet (1 mg total) by mouth daily.  . Calcium Carbonate-Vitamin D 600-400 MG-UNIT tablet Take 1 tablet by mouth daily.  . Cyanocobalamin (VITAMIN B 12) 500 MCG TABS Take 1,000 mcg by mouth daily.   [name] diltiazem (CARDIZEM) 30 MG tablet Take 1 tablet every 4 hours AS NEEDED for afib heart rate >100 as long as top BP >100.  . hydrochlorothiazide (MICROZIDE) 12.5 MG capsule Take 1 capsule (12.5 mg total) by mouth daily.  [name] losartan (COZAAR) 100 MG tablet Take 100 mg by mouth daily.  . Magnesium Oxide 400 MG CAPS Take 1 capsule (400 mg total) by mouth daily.  . metoprolol tartrate (LOPRESSOR) 25 MG tablet Take 0.5 tablets (12.5 mg total) by mouth 2 (two) times  daily.  . Multiple Vitamins-Minerals (MULTIVITAMIN & MINERAL PO) Take 1 tablet by mouth daily.  . Psyllium (METAMUCIL PO) Take by mouth as needed.  . warfarin (COUMADIN) 2 MG tablet TAKE 1/2 TO 1 TAB BY MOUTH DAILY AS DIRECTED BY THE COUMADIN CLINIC   No current facility-administered medications for this visit. (Other)      REVIEW OF SYSTEMS:    ALLERGIES Allergies  Allergen Reactions  . Epinephrine Palpitations    heart racing    PAST MEDICAL HISTORY Past Medical History:  Diagnosis Date  . Anxiety   . Borderline diabetes mellitus   . Cancer ([place]) 2020   breast  . Diverticulosis of colon   . DJD (degenerative joint disease)   . Dyslipidemia   . History of hematuria   . History of osteoporosis   . Hx of colonic polyps   . Hypertension   . Hypothyroid   . Macular degeneration    sees [name] for general eye care, sees [name] for retinal injections   . Memory loss   . NSVT (nonsustained ventricular tachycardia) ([place])   . Osteoporosis    took bisphosphonates, now off. Last DEXA in 2015 showed osteopenia   . Paroxysmal atrial fibrillation (HCC)    sees [name]   . SCCA (squamous cell carcinoma) of skin 12/11/2019   Left Hand Posterior (in situ)   Past  Surgical History:  Procedure Laterality Date  . APPENDECTOMY  1947  . BREAST LUMPECTOMY WITH RADIOACTIVE SEED LOCALIZATION Right 05/14/2019   Procedure: RIGHT BREAST LUMPECTOMY WITH RADIOACTIVE SEED LOCALIZATION;  Surgeon: [name], MD;  Location: [facility];  Service: General;  Laterality: Right;  . CATARACT EXTRACTION W/PHACO Bilateral   . COLONOSCOPY  12/02/2014   per [name], severe diverticulosis but no polyps   . MASS EXCISION  04/17/2012   Procedure: EXCISION MASS;  Surgeon: [name], MD;  Location: [facility];  Service: General;  Laterality: N/A;  . ORIF ANKLE FRACTURE Right 06/14/2020   Procedure: OPEN REDUCTION INTERNAL FIXATION (ORIF) ANKLE FRACTURE;  Surgeon:  [name], MD;  Location: [facility];  Service: Orthopedics;  Laterality: Right;  . THYROIDECTOMY  1990   [name]    FAMILY HISTORY Family History  Problem Relation Age of Onset  . Stroke Mother   . Colon cancer Neg Hx   . Stomach cancer Neg Hx     SOCIAL HISTORY Social History   Tobacco Use  . Smoking status: Former Smoker    Quit date: 07/10/1965    Years since quitting: 55.2  . Smokeless tobacco: Never Used  . Tobacco comment: 3 cigs per week x 3 years (started in 1964), 12/27/15 does not smoke  Vaping Use  . Vaping Use: Never used  Substance Use Topics  . Alcohol use: Yes    Alcohol/week: 3.0 standard drinks    Types: 3 Glasses of wine per week    Comment: occasionally  . Drug use: No         OPHTHALMIC EXAM:  Base Eye Exam    Visual Acuity (Snellen - Linear)      Right Left   Dist [place] 20/60 -1 20/30   Dist ph [place] NI        Tonometry (Tonopen, 10:43 AM)      Right Left   Pressure 18 18       Pupils      Pupils Dark Light Shape React APD   Right PERRL 3 2 Round Brisk None   Left PERRL 3 2 Round Brisk None       Visual Fields (Counting fingers)      Left Right    Full Full       Neuro/Psych    Oriented x3: Yes   Mood/Affect: Normal       Dilation    Both eyes: 1.0% Mydriacyl, 2.5% Phenylephrine @ 10:43 AM        Slit Lamp and Fundus Exam    External Exam      Right Left   External Normal Normal       Slit Lamp Exam      Right Left   Lids/Lashes Normal Normal   Conjunctiva/Sclera White and quiet White and quiet   Cornea Clear Clear   Anterior Chamber Deep and quiet Deep and quiet   Iris PI at 9 Round and reactive   Lens Posterior chamber intraocular lens Posterior chamber intraocular lens   Anterior Vitreous Normal Normal       Fundus Exam      Right Left   Posterior Vitreous Posterior vitreous detachment Posterior vitreous detachment   Disc Normal Normal   C/D Ratio 0.3 0.25   Macula Atrophy, Geographic atrophy, no macular  thickening, no hemorrhage, no exudates Drusen, Soft drusen, Atrophy, Geographic atrophy, no macular thickening, no hemorrhage, Mottling, Retinal pigment epithelial mottling  Vessels Normal Normal   Periphery Normal Normal          IMAGING AND PROCEDURES  Imaging and Procedures for 10/12/20  OCT, Retina - OU - Both Eyes       Right Eye Quality was good. Scan locations included subfoveal. Central Foveal Thickness: 224. Progression has been stable. Findings include abnormal foveal contour, intraretinal fluid.   Left Eye Quality was good. Scan locations included subfoveal. Central Foveal Thickness: 277. Progression has been stable. Findings include abnormal foveal contour.   Notes Chronic subfoveal scarring accounts for acuity yet CNVM is controlled currently at 45-month follow-up today OD, with small region superior to the fovea of active intraretinal fluid stable however at 3 months even improved  OS at 6 -month follow-up and no signs of recurrent CNVM Option to monitor left eye by observation alone, no recurrence of disease OS       Intravitreal Injection, Pharmacologic Agent - OD - Right Eye       Time Out 10/12/2020. 11:18 AM. Confirmed correct patient, procedure, site, and patient consented.   Anesthesia Topical anesthesia was used. Anesthetic medications included Akten 3.5%.   Procedure Preparation included 5% betadine to ocular surface, 10% betadine to eyelids, Tobramycin 0.3%, Ofloxacin . A 30 gauge needle was used.   Injection:  2.5 mg Bevacizumab (AVASTIN) 2.5mg /0.29mL SOSY   NDC: 58527-782-42, Lot: [id number]   Route: Intravitreal, Site: Right Eye  Post-op Post injection exam found visual acuity of at least counting fingers. The patient tolerated the procedure well. There were no complications. The patient received written and verbal post procedure care education. Post injection medications were not given.                 ASSESSMENT/PLAN:  Exudative  age-related macular degeneration of right eye with active choroidal neovascularization (HCC) Overall stable however at 9-month follow-up, the region superior portion of the macula.  We will repeat today injection Avastin to maintain and stabilize and prevent hemorrhagic progression  Exudative age-related macular degeneration of left eye with active choroidal neovascularization ([place]) Now off therapy for 6 months thus 4.5 months without therapy.  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 Exudative age-related macular degeneration of right eye with active choroidal neovascularization (HCC)  H35.3211 OCT, Retina - OU - Both Eyes    Intravitreal Injection, Pharmacologic Agent - OD - Right Eye    bevacizumab (AVASTIN) SOSY 2.5 mg  2. Exudative age-related macular degeneration of left eye with active choroidal neovascularization ([place])  H35.3221   3. Advanced nonexudative age-related macular degeneration of both eyes without subfoveal involvement  H35.3133     1.  Repeat injection intravitreal Avastin OD today in order to control lesion superior to the fovea.  At 3 months still improving.  2.  OS we will continue to watch and observe, 4.5 months off treatment effect, no recurrence of CNVM  3.  Ophthalmic Meds Ordered this visit:  Meds ordered this encounter  Medications  . bevacizumab (AVASTIN) SOSY 2.5 mg       Return in about 3 months (around 01/11/2021) for DILATE OU, AVASTIN OCT, OD.  There are no Patient Instructions on file for this visit.   Explained the diagnoses, plan, and follow up with the patient and they expressed understanding.  Patient expressed understanding of the importance of proper follow up care.   Clent Demark Bernadette Gores M.D. Diseases & Surgery of the  Retina and Vitreous Retina & Diabetic [place] 10/12/20     Abbreviations: M myopia (nearsighted); A astigmatism; H hyperopia (farsighted); P presbyopia; Mrx spectacle prescription;  CTL contact lenses; OD right eye; OS left eye; OU both eyes  XT exotropia; ET esotropia; PEK punctate epithelial keratitis; PEE punctate epithelial erosions; DES dry eye syndrome; MGD meibomian gland dysfunction; ATs artificial tears; PFAT's preservative free artificial tears; [place] nuclear sclerotic cataract; PSC posterior subcapsular cataract; ERM epi-retinal membrane; PVD posterior vitreous detachment; RD retinal detachment; DM diabetes mellitus; DR diabetic retinopathy; NPDR non-proliferative diabetic retinopathy; PDR proliferative diabetic retinopathy; CSME clinically significant macular edema; DME diabetic macular edema; dbh dot blot hemorrhages; CWS cotton wool spot; POAG primary open angle glaucoma; C/D cup-to-disc ratio; HVF humphrey visual field; GVF goldmann visual field; OCT optical coherence tomography; IOP intraocular pressure; BRVO Branch retinal vein occlusion; CRVO central retinal vein occlusion; CRAO central retinal artery occlusion; BRAO branch retinal artery occlusion; RT retinal tear; SB scleral buckle; PPV pars plana vitrectomy; VH Vitreous hemorrhage; PRP panretinal laser photocoagulation; IVK intravitreal kenalog; VMT vitreomacular traction; MH Macular hole;  NVD neovascularization of the disc; NVE neovascularization elsewhere; AREDS age related eye disease study; ARMD age related macular degeneration; POAG primary open angle glaucoma; EBMD epithelial/anterior basement membrane dystrophy; ACIOL anterior chamber intraocular lens; IOL intraocular lens; PCIOL posterior chamber intraocular lens; Phaco/IOL phacoemulsification with intraocular lens placement; [place] photorefractive keratectomy; LASIK laser assisted in situ keratomileusis; HTN hypertension; DM diabetes mellitus; COPD chronic obstructive pulmonary  disease

## 2020-10-13 ENCOUNTER — Telehealth: Payer: Self-pay | Admitting: Pharmacist

## 2020-10-13 ENCOUNTER — Other Ambulatory Visit: Payer: Self-pay

## 2020-10-13 ENCOUNTER — Other Ambulatory Visit: Payer: Self-pay | Admitting: *Deleted

## 2020-10-13 DIAGNOSIS — R7989 Other specified abnormal findings of blood chemistry: Secondary | ICD-10-CM

## 2020-10-13 DIAGNOSIS — I472 Ventricular tachycardia: Secondary | ICD-10-CM

## 2020-10-13 DIAGNOSIS — R55 Syncope and collapse: Secondary | ICD-10-CM

## 2020-10-13 DIAGNOSIS — I4729 Other ventricular tachycardia: Secondary | ICD-10-CM

## 2020-10-13 DIAGNOSIS — C50411 Malignant neoplasm of upper-outer quadrant of right female breast: Secondary | ICD-10-CM

## 2020-10-13 DIAGNOSIS — R790 Abnormal level of blood mineral: Secondary | ICD-10-CM

## 2020-10-13 DIAGNOSIS — Z170 Estrogen receptor positive status [ER+]: Secondary | ICD-10-CM

## 2020-10-13 DIAGNOSIS — E038 Other specified hypothyroidism: Secondary | ICD-10-CM

## 2020-10-13 DIAGNOSIS — Z79899 Other long term (current) drug therapy: Secondary | ICD-10-CM

## 2020-10-13 MED ORDER — ANASTROZOLE 1 MG PO TABS: 1.0000 mg | ORAL_TABLET | Freq: Every day | ORAL | 0 refills | Status: DC

## 2020-10-13 MED ORDER — MAGNESIUM OXIDE 400 MG PO CAPS: 400.0000 mg | ORAL_CAPSULE | Freq: Every day | ORAL | 11 refills | Status: DC

## 2020-10-13 NOTE — Chronic Care Management (AMB) (Signed)
Chronic Care Management Pharmacy Assistant   Name: Amayia Ciano  MRN: [id number] DOB: [birth year]  Reason for Encounter: Medication Review-Medication Coordination Call   Recent office visits:  None  Recent consult visits:  . 04.05.2022 Hurman Horn, MD Ophthalmology . 03.30.2022 Imogene Burn, [facility] Cardiology  Hospital visits:  None in previous 6 months  Medications: Outpatient Encounter Medications as of 10/13/2020  Medication Sig  . anastrozole (ARIMIDEX) 1 MG tablet Take 1 tablet (1 mg total) by mouth daily.  . Calcium Carbonate-Vitamin D 600-400 MG-UNIT tablet Take 1 tablet by mouth daily.  . Cyanocobalamin (VITAMIN B 12) 500 MCG TABS Take 1,000 mcg by mouth daily.   [name] diltiazem (CARDIZEM) 30 MG tablet Take 1 tablet every 4 hours AS NEEDED for afib heart rate >100 as long as top BP >100.  . hydrochlorothiazide (MICROZIDE) 12.5 MG capsule Take 1 capsule (12.5 mg total) by mouth daily.  [name] losartan (COZAAR) 100 MG tablet Take 100 mg by mouth daily.  . Magnesium Oxide 400 MG CAPS Take 1 capsule (400 mg total) by mouth daily.  . metoprolol tartrate (LOPRESSOR) 25 MG tablet Take 0.5 tablets (12.5 mg total) by mouth 2 (two) times daily.  . Multiple Vitamins-Minerals (MULTIVITAMIN & MINERAL PO) Take 1 tablet by mouth daily.  . Psyllium (METAMUCIL PO) Take by mouth as needed.  . warfarin (COUMADIN) 2 MG tablet TAKE 1/2 TO 1 TAB BY MOUTH DAILY AS DIRECTED BY THE COUMADIN CLINIC   No facility-administered encounter medications on file as of 10/13/2020.   Reviewed chart for medication changes ahead of medication coordination call.  BP Readings from Last 3 Encounters:  10/06/20 (!) 148/76  10/04/20 (!) 148/82  08/30/20 (!) 154/80    Lab Results  Component Value Date   HGBA1C 7.4 (H) 06/11/2020   Patient obtains medications through Vials  30 Days  Last adherence delivery included:  [name] Anastrozole (ARIMIDEX) 1 mg: one tablet at dinner . Calcium Carbonate-Vitamin D 600-400  mg-unit: one at breakfast . Cyanocobalamin (VITAMIN B 12) 1000 mcg: one tablet at breakfast . Losartan (COZAAR) 100 MG tablet: one tablet at breakfast . Hydrochlorothiazide (HYDRODIURIL) 12.5 MG capsule: one capsule at breakfast . Metoprolol tartrate (LOPRESSOR) 25 mg:  tablet (12.5 mg) at breakfast and  at dinner . Multiple Vitamins-Minerals: one tablet at breakfast . Warfarin (Coumadin) 2 mg Take 1/2 tablet to 1 tablet by mouth daily as directed by the coumadin clinic.  . Magnesium Oxide 400 mg: one capsule daily  Patient declined the following medication last month due to PRN use. . Diltiazem (CARDIZEM) 30 mg tablet PRN  Patient is due for next adherence delivery on: 10/15/2020. Called patient and reviewed medications and coordinated delivery. This delivery to include: . Anastrozole (ARIMIDEX) 1 mg: one tablet at dinner . Calcium Carbonate-Vitamin D 600-400 mg-unit: one at breakfast . Cyanocobalamin (VITAMIN B 12) 1000 mcg: one tablet at breakfast . Losartan (COZAAR) 100 MG tablet: one tablet at breakfast . Hydrochlorothiazide (HYDRODIURIL) 12.5 MG capsule: one capsule at breakfast . Metoprolol tartrate (LOPRESSOR) 25 mg:  tablet (12.5 mg) at breakfast and  at dinner . Multiple Vitamins-Minerals: one tablet at breakfast . Warfarin (Coumadin) 2 mg Take 1/2 tablet to 1 tablet by mouth daily as directed by the coumadin clinic.  . Magnesium Oxide 400 mg: one capsule daily  Patient declined the following medications due to PRN use. . Diltiazem (CARDIZEM) 30 mg tablet PRN  Patient needs refills for: . Anastrozole (ARIMIDEX) 1 mg: one tablet at  dinner . Magnesium Oxide 400 mg: one capsule daily  Confirmed delivery date of 10/15/2020, advised patient that pharmacy will contact them the morning of delivery.  Star Rating Drugs:  Dispensed Quantity Pharmacy  Losartan 100 mg   03.10.2022 [facility]   [name], [place] [phone]

## 2020-10-27 ENCOUNTER — Other Ambulatory Visit: Payer: Self-pay | Admitting: Cardiology

## 2020-10-27 DIAGNOSIS — Z7689 Persons encountering health services in other specified circumstances: Secondary | ICD-10-CM | POA: Diagnosis not present

## 2020-10-27 LAB — PROTIME-INR: INR: 2.4 — AB (ref 0.9–1.1)

## 2020-10-28 ENCOUNTER — Ambulatory Visit (INDEPENDENT_AMBULATORY_CARE_PROVIDER_SITE_OTHER): Payer: Medicare Other | Admitting: Internal Medicine

## 2020-10-28 DIAGNOSIS — Z5181 Encounter for therapeutic drug level monitoring: Secondary | ICD-10-CM | POA: Diagnosis not present

## 2020-10-28 LAB — PROTIME-INR
INR: 2.2 — ABNORMAL HIGH (ref 0.9–1.2)
Prothrombin Time: 22.8 s — ABNORMAL HIGH (ref 9.1–12.0)

## 2020-10-28 NOTE — Patient Instructions (Addendum)
Description   Called and spoke to pt and instructed her to continue taking Warfarin 1 tablet (2mg ) daily. Recheck INR in 3 weeks. Pt might still be in [place] and will get INR checked at [facility]. Wrote pt's name down in the coumadin clinic follow up book.   Coumadin Clinic [phone].

## 2020-11-03 ENCOUNTER — Other Ambulatory Visit: Payer: Self-pay | Admitting: Cardiology

## 2020-11-03 NOTE — Telephone Encounter (Signed)
Prescription refill request received for warfarin Lov: 08/30/2020, [facility] Next INR check: 5/12 Warfarin tablet strength: 2mg 

## 2020-11-08 ENCOUNTER — Telehealth: Payer: Self-pay | Admitting: Pharmacist

## 2020-11-08 ENCOUNTER — Other Ambulatory Visit: Payer: Self-pay | Admitting: Oncology

## 2020-11-08 MED ORDER — LOSARTAN POTASSIUM 100 MG PO TABS: 100.0000 mg | ORAL_TABLET | Freq: Every day | ORAL | 4 refills | Status: AC

## 2020-11-08 NOTE — Chronic Care Management (AMB) (Signed)
Chronic Care Management Pharmacy Assistant   Name: Aidan Caloca  MRN: [id number] DOB: 06-19-1933  Reason for Encounter: Medication Review-Medication Coordination Call   Recent office visits:  None  Recent consult visits:  None  Hospital visits:  None in previous 6 months  Medications: Outpatient Encounter Medications as of 11/08/2020  Medication Sig  . anastrozole (ARIMIDEX) 1 MG tablet Take 1 tablet (1 mg total) by mouth daily.  . Calcium Carbonate-Vitamin D 600-400 MG-UNIT tablet Take 1 tablet by mouth daily.  . Cyanocobalamin (VITAMIN B 12) 500 MCG TABS Take 1,000 mcg by mouth daily.   [name] diltiazem (CARDIZEM) 30 MG tablet Take 1 tablet every 4 hours AS NEEDED for afib heart rate >100 as long as top BP >100.  . hydrochlorothiazide (MICROZIDE) 12.5 MG capsule Take 1 capsule (12.5 mg total) by mouth daily.  [name] losartan (COZAAR) 100 MG tablet Take 100 mg by mouth daily.  . Magnesium Oxide 400 MG CAPS Take 1 capsule (400 mg total) by mouth daily.  . metoprolol tartrate (LOPRESSOR) 25 MG tablet Take 0.5 tablets (12.5 mg total) by mouth 2 (two) times daily.  . Multiple Vitamins-Minerals (MULTIVITAMIN & MINERAL PO) Take 1 tablet by mouth daily.  . Psyllium (METAMUCIL PO) Take by mouth as needed.  . warfarin (COUMADIN) 2 MG tablet TAKE ONE-HALF TO ONE TAB BY MOUTH DAILY AS DIRECTED BY THE COUMADIN CLINIC   No facility-administered encounter medications on file as of 11/08/2020.   Reviewed chart for medication changes ahead of medication coordination call.  BP Readings from Last 3 Encounters:  10/06/20 (!) 148/76  10/04/20 (!) 148/82  08/30/20 (!) 154/80    Lab Results  Component Value Date   HGBA1C 7.4 (H) 06/11/2020    Patient obtains medications through Vials  30 Days Last adherence delivery included:  [name] Anastrozole (ARIMIDEX) 1 mg: one tablet at dinner . Calcium Carbonate-Vitamin D 600-400 mg-unit: one at breakfast . Cyanocobalamin (VITAMIN B 12) 1000 mcg: one tablet at  breakfast . Losartan (COZAAR) 100 MG tablet: one tablet at breakfast . Hydrochlorothiazide (HYDRODIURIL) 12.5 MG capsule: one capsule at breakfast . Metoprolol tartrate (LOPRESSOR) 25 mg:  tablet (12.5 mg) at breakfast and  at dinner . Multiple Vitamins-Minerals: one tablet at breakfast . Warfarin (Coumadin) 2 mg Take 1/2 tablet to 1 tablet by mouth daily as directed by the coumadin clinic.  . Magnesium Oxide 400 mg: one capsule daily  Patient declined the following medication last month due to PRN use. . Diltiazem (CARDIZEM) 30 mg tablet PRN  Patient is due for next adherence delivery on: 11/10/2020. Called patient and reviewed medications and coordinated delivery. This delivery to include: . Anastrozole (ARIMIDEX) 1 mg: one tablet at dinner . Calcium Carbonate-Vitamin D 600-400 mg-unit: one at breakfast . Cyanocobalamin (VITAMIN B 12) 1000 mcg: one tablet at breakfast . Losartan (COZAAR) 100 MG tablet: one tablet at breakfast . Hydrochlorothiazide (HYDRODIURIL) 12.5 MG capsule: one capsule at breakfast . Metoprolol tartrate (LOPRESSOR) 25 mg:  tablet (12.5 mg) at breakfast and  at dinner . Multiple Vitamins-Minerals: one tablet at breakfast . Warfarin (Coumadin) 2 mg Take 1/2 tablet to 1 tablet by mouth daily as directed by the coumadin clinic.  . Magnesium Oxide 400 mg: one capsule daily  Patient declined the following medication last month due to PRN use. . Diltiazem (CARDIZEM) 30 mg tablet PRN  Patient needs refills for: . Losartan (COZAAR) 100 MG tablet: one tablet at breakfast  Confirmed delivery date of 11/10/2020, advised patient that  pharmacy will contact them the morning of delivery.  Star Rating Drugs:  Dispensed Quantity Pharmacy  Losartan 100 mg 04.07.2022 [facility]   [name], [employer] [phone]

## 2020-11-16 ENCOUNTER — Telehealth: Payer: Self-pay | Admitting: Cardiology

## 2020-11-16 NOTE — Telephone Encounter (Signed)
Patient's daughter, [name], states the patient is currently in [place], [place]. She is hoping the patient can have her INR taken with [facility] in [place]. She is requesting to have an order for an INR sent to her e-mail at [email or web address]. She would like a confirmation call when the e-mail is sent.

## 2020-11-16 NOTE — Telephone Encounter (Signed)
Called and spoke to pt's Daughter. Informed daughter we will not be able to e-mail order. Daughter stated that pt now goes to the [facility] [address] [place]. Phone number: [phone]. Per online fax number is [phone]. Will fax over order to the [place] daughter aware that the order has been faxed. Pt is planning to go 5/12 to have INR checked.

## 2020-11-18 ENCOUNTER — Other Ambulatory Visit: Payer: Self-pay | Admitting: Cardiology

## 2020-11-18 DIAGNOSIS — Z7689 Persons encountering health services in other specified circumstances: Secondary | ICD-10-CM | POA: Diagnosis not present

## 2020-11-18 LAB — PROTIME-INR: INR: 2.4 — AB (ref 0.9–1.1)

## 2020-11-19 ENCOUNTER — Ambulatory Visit (INDEPENDENT_AMBULATORY_CARE_PROVIDER_SITE_OTHER): Payer: Medicare Other | Admitting: Cardiovascular Disease

## 2020-11-19 DIAGNOSIS — Z5181 Encounter for therapeutic drug level monitoring: Secondary | ICD-10-CM

## 2020-11-19 LAB — PROTIME-INR
INR: 2.4 — ABNORMAL HIGH (ref 0.9–1.2)
Prothrombin Time: 24.1 s — ABNORMAL HIGH (ref 9.1–12.0)

## 2020-11-19 NOTE — Patient Instructions (Addendum)
Description   Called and spoke to Daughter and instructed for pt to continue taking Warfarin 1 tablet (2mg ) daily. Recheck INR in 6 weeks. Pt still might be in [place] and if she is will get lab checked at [place]. Wrote pt's name down in the coumadin clinic follow up book.   Coumadin Clinic [phone].

## 2020-11-22 ENCOUNTER — Telehealth: Payer: Self-pay | Admitting: Family Medicine

## 2020-11-22 NOTE — Telephone Encounter (Signed)
Left message for patient to call back and schedule Medicare Annual Wellness Visit (AWV) either virtually or in office.   AWV-I per [place] 07/10/09 please schedule at anytime with [facility] Nurse Health Advisor 1 or 2   This should be a 45 minute visit. 

## 2020-12-03 ENCOUNTER — Telehealth: Payer: Self-pay | Admitting: Pharmacist

## 2020-12-03 NOTE — Chronic Care Management (AMB) (Signed)
Chronic Care Management Pharmacy Assistant   Name: Cynthia Mathis  MRN: [id number] DOB: [birth year]  Reason for Encounter: Medication Review-Medication Coordination Call  Recent office visits:  None  Recent consult visits:  None  Hospital visits:  None in previous 6 months  Medications: Outpatient Encounter Medications as of 12/03/2020  Medication Sig  . anastrozole (ARIMIDEX) 1 MG tablet Take 1 tablet (1 mg total) by mouth daily.  . Calcium Carbonate-Vitamin D 600-400 MG-UNIT tablet Take 1 tablet by mouth daily.  . Cyanocobalamin (VITAMIN B 12) 500 MCG TABS Take 1,000 mcg by mouth daily.   [name] diltiazem (CARDIZEM) 30 MG tablet Take 1 tablet every 4 hours AS NEEDED for afib heart rate >100 as long as top BP >100.  . hydrochlorothiazide (MICROZIDE) 12.5 MG capsule Take 1 capsule (12.5 mg total) by mouth daily.  [name] losartan (COZAAR) 100 MG tablet Take 1 tablet (100 mg total) by mouth daily.  . Magnesium Oxide 400 MG CAPS Take 1 capsule (400 mg total) by mouth daily.  . metoprolol tartrate (LOPRESSOR) 25 MG tablet Take 0.5 tablets (12.5 mg total) by mouth 2 (two) times daily.  . Multiple Vitamins-Minerals (MULTIVITAMIN & MINERAL PO) Take 1 tablet by mouth daily.  . Psyllium (METAMUCIL PO) Take by mouth as needed.  . warfarin (COUMADIN) 2 MG tablet TAKE ONE-HALF TO ONE TAB BY MOUTH DAILY AS DIRECTED BY THE COUMADIN CLINIC   No facility-administered encounter medications on file as of 12/03/2020.    Reviewed chart for medication changes ahead of medication coordination call.  BP Readings from Last 3 Encounters:  10/06/20 (!) 148/76  10/04/20 (!) 148/82  08/30/20 (!) 154/80    Lab Results  Component Value Date   HGBA1C 7.4 (H) 06/11/2020    Patient obtains medications through Vials  30 Days  Last adherence delivery included:  - Anastrozole (ARIMIDEX) 1 mg: one tablet at dinner - Calcium Carbonate-Vitamin D 600-400 mg-unit: one at breakfast - Cyanocobalamin (VITAMIN B 12)  1000 mcg: one tablet at breakfast - Losartan (COZAAR) 100 MG tablet: one tablet at breakfast - Hydrochlorothiazide (HYDRODIURIL) 12.5 MG capsule: one capsule at breakfast - Metoprolol tartrate (LOPRESSOR) 25 mg:  tablet (12.5 mg) at breakfast and  at dinner - Multiple Vitamins-Minerals: one tablet at breakfast - Warfarin (Coumadin) 2 mg Take 1/2 tablet to 1 tablet by mouth daily as directed by the coumadin clinic.  - Magnesium Oxide 400 mg: one capsule daily  Patient declined the following medication last month due to PRN use. - Diltiazem (CARDIZEM) 30 mg tablet PRN  Patient is due for next adherence delivery on: 12/09/2020. Called patient and reviewed medications and coordinated delivery. This delivery to include: - Anastrozole (ARIMIDEX) 1 mg: one tablet at dinner - Calcium Carbonate-Vitamin D 600-400 mg-unit: one at breakfast - Cyanocobalamin (VITAMIN B 12) 1000 mcg: one tablet at breakfast - Losartan (COZAAR) 100 MG tablet: one tablet at breakfast - Hydrochlorothiazide (HYDRODIURIL) 12.5 MG capsule: one capsule at breakfast - Metoprolol tartrate (LOPRESSOR) 25 mg:  tablet (12.5 mg) at breakfast and  at dinner - Multiple Vitamins-Minerals: one tablet at breakfast - Warfarin (Coumadin) 2 mg Take 1/2 tablet to 1 tablet by mouth daily as directed by the coumadin clinic.  - Magnesium Oxide 400 mg: one capsule daily  Patient declined the following medications due to PRN use. - Diltiazem (CARDIZEM) 30 mg tablet PRN   She currently doe snot need refills.  Confirmed delivery date of 12/09/2020, advised patient that pharmacy will contact them the  morning of delivery.  Star Rating Drugs:  Dispensed Quantity Pharmacy  Losartan 100 mg 05.03.2022 [facility]   [name], [place] Pharmacist Assistant [phone]

## 2020-12-17 ENCOUNTER — Telehealth: Payer: Self-pay

## 2020-12-17 ENCOUNTER — Other Ambulatory Visit: Payer: Self-pay

## 2020-12-17 ENCOUNTER — Other Ambulatory Visit: Payer: Self-pay | Admitting: Family Medicine

## 2020-12-17 ENCOUNTER — Telehealth: Payer: Self-pay | Admitting: Family Medicine

## 2020-12-17 MED ORDER — DIPHENOXYLATE-ATROPINE 2.5-0.025 MG PO TABS: 1.0000 | ORAL_TABLET | Freq: Four times a day (QID) | ORAL | 1 refills | Status: DC | PRN

## 2020-12-17 NOTE — Telephone Encounter (Signed)
Patient's daughter [name] called to talk with Dr. Sarajane Jews. She is not able to set up a virtual visit for the patient because the patient is in [place] her daughter.   The patient had COVID and has diarrhea for 4 and half days now and the daughter has her on a Brat diet.  She started taking Imodium 06/09  She would like some advise on what to do for her mother.  Please call Monica at [phone]

## 2020-12-17 NOTE — Telephone Encounter (Signed)
Spoke with pt daughter advised that Dr Sarajane Jews advise for pt to take Lomotil for diarrhea which was sent to requested pharmacy at [place].

## 2020-12-17 NOTE — Telephone Encounter (Signed)
Last office visit- 07/19/20

## 2020-12-17 NOTE — Telephone Encounter (Signed)
I would be glad to call in some Lomotil for the diarrhea, if they have the pharmacy's information

## 2020-12-21 ENCOUNTER — Telehealth: Payer: Self-pay | Admitting: Pharmacist

## 2020-12-21 NOTE — Chronic Care Management (AMB) (Signed)
Chronic Care Management Pharmacy Assistant   Name: Cynthia Mathis  MRN: [id number] DOB: 10/26/1932  Reason for Encounter: Disease State/Hypertension Assessment Call.   Conditions to be addressed/monitored: HTN  Recent office visits:  None.   Recent consult visits:  None.   Hospital visits:  None in previous 6 months  Medications: Outpatient Encounter Medications as of 12/21/2020  Medication Sig   anastrozole (ARIMIDEX) 1 MG tablet Take 1 tablet (1 mg total) by mouth daily.   Calcium Carbonate-Vitamin D 600-400 MG-UNIT tablet Take 1 tablet by mouth daily.   Cyanocobalamin (VITAMIN B 12) 500 MCG TABS Take 1,000 mcg by mouth daily.    diltiazem (CARDIZEM) 30 MG tablet Take 1 tablet every 4 hours AS NEEDED for afib heart rate >100 as long as top BP >100.   diphenoxylate-atropine (LOMOTIL) 2.5-0.025 MG tablet Take 1 tablet by mouth 4 (four) times daily as needed for diarrhea or loose stools.   hydrochlorothiazide (MICROZIDE) 12.5 MG capsule Take 1 capsule (12.5 mg total) by mouth daily.   losartan (COZAAR) 100 MG tablet Take 1 tablet (100 mg total) by mouth daily.   Magnesium Oxide 400 MG CAPS Take 1 capsule (400 mg total) by mouth daily.   metoprolol tartrate (LOPRESSOR) 25 MG tablet Take 0.5 tablets (12.5 mg total) by mouth 2 (two) times daily.   Multiple Vitamins-Minerals (MULTIVITAMIN & MINERAL PO) Take 1 tablet by mouth daily.   Psyllium (METAMUCIL PO) Take by mouth as needed.   warfarin (COUMADIN) 2 MG tablet TAKE ONE-HALF TO ONE TAB BY MOUTH DAILY AS DIRECTED BY THE COUMADIN CLINIC   No facility-administered encounter medications on file as of 12/21/2020.    Recent Relevant Labs: Lab Results  Component Value Date/Time   HGBA1C 7.4 (H) 06/11/2020 03:33 AM   HGBA1C 6.6 (H) 02/12/2019 09:05 AM    Kidney Function Lab Results  Component Value Date/Time   CREATININE 1.08 (H) 09/09/2020 09:43 AM   CREATININE 1.21 (H) 08/26/2020 11:16 AM   CREATININE 1.06 (H) 03/26/2019  03:21 PM   GFR 59.66 (L) 02/13/2018 12:36 PM   GFRNONAA 41 (L) 08/26/2020 11:16 AM   GFRNONAA 59 (L) 06/11/2020 03:33 AM   GFRNONAA 48 (L) 03/26/2019 03:21 PM   GFRAA 47 (L) 08/26/2020 11:16 AM   GFRAA 56 (L) 03/26/2019 03:21 PM    Reviewed chart prior to disease state call. Spoke with patient regarding BP  Recent Office Vitals: BP Readings from Last 3 Encounters:  10/06/20 (!) 148/76  10/04/20 (!) 148/82  08/30/20 (!) 154/80   Pulse Readings from Last 3 Encounters:  10/06/20 69  08/30/20 75  08/13/20 71    Wt Readings from Last 3 Encounters:  08/30/20 146 lb 3.2 oz (66.3 kg)  08/26/20 145 lb (65.8 kg)  08/13/20 145 lb 12.8 oz (66.1 kg)     Kidney Function Lab Results  Component Value Date/Time   CREATININE 1.08 (H) 09/09/2020 09:43 AM   CREATININE 1.21 (H) 08/26/2020 11:16 AM   CREATININE 1.06 (H) 03/26/2019 03:21 PM   GFR 59.66 (L) 02/13/2018 12:36 PM   GFRNONAA 41 (L) 08/26/2020 11:16 AM   GFRNONAA 59 (L) 06/11/2020 03:33 AM   GFRNONAA 48 (L) 03/26/2019 03:21 PM   GFRAA 47 (L) 08/26/2020 11:16 AM   GFRAA 56 (L) 03/26/2019 03:21 PM    BMP Latest Ref Rng & Units 09/09/2020 08/26/2020 08/13/2020  Glucose 65 - 99 mg/dL 269(H) 315(H) 239(H)  BUN 8 - 27 mg/dL 23 24 25   Creatinine 0.57 -  1.00 mg/dL 1.08(H) 1.21(H) 1.28(H)  BUN/Creat Ratio 12 - 28 21 20 20   Sodium 134 - 144 mmol/L 139 140 142  Potassium 3.5 - 5.2 mmol/L 4.6 3.6 4.0  Chloride 96 - 106 mmol/L 98 96 98  CO2 20 - 29 mmol/L 20 23 26   Calcium 8.7 - 10.3 mg/dL 10.0 9.6 10.1    Current antihypertensive regimen:  Diltiazem 30mg  - take 1 tablet every 4 hours as needed for heart rate.  Hydrochlorothiazide 12.5mg  - take 1 tablet daily.  Losartan 100mg  - take 1 tablet daily.  Metoprolol 25mg  - take 0.5 tablet twice daily.   How often are you checking your Blood Pressure?  Current home BP readings:  What recent interventions/DTPs have been made by any provider to improve Blood Pressure control since last CPP  Visit: None.   Any recent hospitalizations or ED visits since last visit with CPP? No What diet changes have been made to improve Blood Pressure Control?   What exercise is being done to improve your Blood Pressure Control?    Adherence Review: Is the patient currently on ACE/ARB medication? Yes Does the patient have >5 day gap between last estimated fill dates? No  Multiple unsuccessful attempts to reach patient by phone.   Star Rating Drugs:  Losartan 100mg  - last filled on 12/10/20 30DS at [facility] Pharmacist Assistant [phone]

## 2020-12-23 NOTE — Telephone Encounter (Cosign Needed)
2nd attempt

## 2020-12-28 NOTE — Telephone Encounter (Cosign Needed)
3rd attempt

## 2020-12-30 ENCOUNTER — Telehealth: Payer: Self-pay | Admitting: Cardiology

## 2020-12-30 ENCOUNTER — Other Ambulatory Visit: Payer: Self-pay | Admitting: Cardiology

## 2020-12-30 ENCOUNTER — Telehealth: Payer: Self-pay | Admitting: *Deleted

## 2020-12-30 ENCOUNTER — Telehealth: Payer: Self-pay | Admitting: Pharmacist

## 2020-12-30 ENCOUNTER — Telehealth: Payer: Self-pay | Admitting: Family Medicine

## 2020-12-30 ENCOUNTER — Other Ambulatory Visit: Payer: Self-pay | Admitting: Oncology

## 2020-12-30 ENCOUNTER — Telehealth: Payer: Self-pay

## 2020-12-30 DIAGNOSIS — I480 Paroxysmal atrial fibrillation: Secondary | ICD-10-CM | POA: Diagnosis not present

## 2020-12-30 DIAGNOSIS — Z7901 Long term (current) use of anticoagulants: Secondary | ICD-10-CM | POA: Diagnosis not present

## 2020-12-30 DIAGNOSIS — Z170 Estrogen receptor positive status [ER+]: Secondary | ICD-10-CM

## 2020-12-30 DIAGNOSIS — C50411 Malignant neoplasm of upper-outer quadrant of right female breast: Secondary | ICD-10-CM

## 2020-12-30 LAB — PROTIME-INR: INR: 2.6 — AB (ref 0.9–1.1)

## 2020-12-30 MED ORDER — WARFARIN SODIUM 2 MG PO TABS: ORAL_TABLET | ORAL | 1 refills | Status: DC

## 2020-12-30 NOTE — Telephone Encounter (Signed)
Patient's daughter Oretha Caprice called to see if Dr. Sarajane Jews can send an order for coumadin to [facility] in [place].  [facility] [address], [place]: [phone]

## 2020-12-30 NOTE — Telephone Encounter (Signed)
*  STAT* If patient is at the pharmacy, call can be transferred to refill team.   1. Which medications need to be refilled? (please list name of each medication and dose if known)   warfarin (COUMADIN) 2 MG tablet    2. Which pharmacy/location (including street and city if local pharmacy) is medication to be sent to? [facility] - [place], [place] - [place] [address]  3. Do they need a 30 day or 90 day supply? 30 day

## 2020-12-30 NOTE — Telephone Encounter (Signed)
Called pt/dtr since INR is due today. Spoke with dtr Cynthia Mathis and she stated that they were planning to go today but did not know if the order had been sent. Advised that we have not been contacted regarding this, she stated they called PCP and left a message. Advised that we at Cardiology will send since we have been monitoring her Warfarin dose and INR. She was appreciative.   The order was faxed to the [facility]: [phone] [address], [place]   She states she will take the pt today. Wrote order to obtain pt/inr today and as needed for 6 months-faxed to above [facility].    The results will be sent to Anticoagulation Clinic per the written order and we will follow up once received. Also, sent her local Warfarin refill as requested.   Will send this to Dr. Sarajane Jews so he will know this has been taken care of.

## 2020-12-30 NOTE — Telephone Encounter (Signed)
Cynthia Mathis is the patients daughter that lives in [place]. The patient is staying with her for a couple of weeks and would like to know if Dr. Sarajane Jews can send a Rx or doctors order to [facility] for a manual wheelchair. The patient is having a hard time just walking a mile.  [facility] [address] [place], [place] Fax: [phone]

## 2020-12-30 NOTE — Telephone Encounter (Signed)
Last office visit- 07/19/20

## 2020-12-30 NOTE — Telephone Encounter (Signed)
Ok for the order 

## 2020-12-30 NOTE — Telephone Encounter (Signed)
Please advise if ok for this order

## 2020-12-30 NOTE — Chronic Care Management (AMB) (Signed)
Chronic Care Management Pharmacy Assistant   Name: Cynthia Mathis  MRN: [id number] DOB: [birth year]  Reason for Encounter: Medication Review  Recent office visits:  None  Recent consult visits:  None  Hospital visits:  None in previous 6 months  Medications: Outpatient Encounter Medications as of 12/30/2020  Medication Sig   anastrozole (ARIMIDEX) 1 MG tablet Take 1 tablet (1 mg total) by mouth daily.   Calcium Carbonate-Vitamin D 600-400 MG-UNIT tablet Take 1 tablet by mouth daily.   Cyanocobalamin (VITAMIN B 12) 500 MCG TABS Take 1,000 mcg by mouth daily.    diltiazem (CARDIZEM) 30 MG tablet Take 1 tablet every 4 hours AS NEEDED for afib heart rate >100 as long as top BP >100.   diphenoxylate-atropine (LOMOTIL) 2.5-0.025 MG tablet Take 1 tablet by mouth 4 (four) times daily as needed for diarrhea or loose stools.   hydrochlorothiazide (MICROZIDE) 12.5 MG capsule Take 1 capsule (12.5 mg total) by mouth daily.   losartan (COZAAR) 100 MG tablet Take 1 tablet (100 mg total) by mouth daily.   Magnesium Oxide 400 MG CAPS Take 1 capsule (400 mg total) by mouth daily.   metoprolol tartrate (LOPRESSOR) 25 MG tablet Take 0.5 tablets (12.5 mg total) by mouth 2 (two) times daily.   Multiple Vitamins-Minerals (MULTIVITAMIN & MINERAL PO) Take 1 tablet by mouth daily.   Psyllium (METAMUCIL PO) Take by mouth as needed.   warfarin (COUMADIN) 2 MG tablet TAKE ONE-HALF TO ONE TAB BY MOUTH DAILY AS DIRECTED BY THE COUMADIN CLINIC   No facility-administered encounter medications on file as of 12/30/2020.   Reviewed chart for medication changes ahead of medication coordination call. No OVs, Consults, or hospital visits since last care coordination call/Pharmacist visit.  No medication changes indicated OR if recent visit, treatment plan here.  BP Readings from Last 3 Encounters:  10/06/20 (!) 148/76  10/04/20 (!) 148/82  08/30/20 (!) 154/80    Lab Results  Component Value Date   HGBA1C  7.4 (H) 06/11/2020    Patient obtains medications through Vials  30 Days  Last adherence delivery included: Anastrozole (ARIMIDEX) 1 mg: one tablet at dinner Calcium Carbonate-Vitamin D 600-400 mg-unit: one at breakfast Cyanocobalamin (VITAMIN B 12) 1000 mcg: one tablet at breakfast Losartan (COZAAR) 100 MG tablet: one tablet at breakfast Hydrochlorothiazide (HYDRODIURIL) 12.5 MG capsule: one capsule at breakfast Metoprolol tartrate (LOPRESSOR) 25 mg:  tablet (12.5 mg) at breakfast and  at dinner Multiple Vitamins-Minerals: one tablet at breakfast Warfarin (Coumadin) 2 mg Take 1/2 tablet to 1 tablet by mouth daily as directed by the coumadin clinic. Magnesium Oxide 400 mg: one capsule daily adherence delivery included:   Patient declined the following medication last month due to PRN use/additional supply on hand. Diltiazem (CARDIZEM) 30 mg tablet PRN  Patient is due for next adherence delivery on: 07.01.2022. Called patient and reviewed medications and coordinated delivery. This delivery to include: Anastrozole (ARIMIDEX) 1 mg: one tablet at dinner Calcium Carbonate-Vitamin D 600-400 mg-unit: one at breakfast Cyanocobalamin (VITAMIN B 12) 1000 mcg: one tablet at breakfast Losartan (COZAAR) 100 MG tablet: one tablet at breakfast Hydrochlorothiazide (HYDRODIURIL) 12.5 MG capsule: one capsule at breakfast Metoprolol tartrate (LOPRESSOR) 25 mg:  tablet (12.5 mg) at breakfast and  at dinner Multiple Vitamins-Minerals: one tablet at breakfast Warfarin (Coumadin) 2 mg Take 1/2 tablet to 1 tablet by mouth daily as directed by the coumadin clinic. Magnesium Oxide 400 mg: one capsule daily  Patient declined the following medications due to  Diltiazem (  CARDIZEM) 30 mg tablet PRN  Patient needs refills for : Anastrozole (ARIMIDEX) 1 mg: one tablet at dinner Warfarin (Coumadin) 2 mg Take 1/2 tablet to 1 tablet by mouth daily as directed by the coumadin clinic.  Confirmed delivery date of  07.01.2022, advised patient that pharmacy will contact them the morning of delivery.   Star Rating Drugs: Medication Dispensed  Quantity Pharmacy  Losartan 100 mg 06.03.2022 [facility]   [name], [place] Pharmacist Assistant [phone]

## 2020-12-31 LAB — PROTIME-INR
INR: 2.6 — ABNORMAL HIGH (ref 0.9–1.2)
Prothrombin Time: 26.1 s — ABNORMAL HIGH (ref 9.1–12.0)

## 2020-12-31 NOTE — Telephone Encounter (Signed)
This is handled by the Coumadin Clinic, not [place]

## 2020-12-31 NOTE — Telephone Encounter (Signed)
FYI

## 2020-12-31 NOTE — Telephone Encounter (Signed)
Please advise 

## 2020-12-31 NOTE — Telephone Encounter (Signed)
Excellent

## 2020-12-31 NOTE — Telephone Encounter (Signed)
Entered in error

## 2021-01-03 NOTE — Telephone Encounter (Signed)
This has been addressed in separate 12/30/20 phone note by Candance Hemphill. INR order has been faxed to [facility] in [place].

## 2021-01-04 ENCOUNTER — Ambulatory Visit (INDEPENDENT_AMBULATORY_CARE_PROVIDER_SITE_OTHER): Payer: Medicare Other | Admitting: *Deleted

## 2021-01-04 DIAGNOSIS — I480 Paroxysmal atrial fibrillation: Secondary | ICD-10-CM | POA: Diagnosis not present

## 2021-01-04 DIAGNOSIS — Z7901 Long term (current) use of anticoagulants: Secondary | ICD-10-CM | POA: Diagnosis not present

## 2021-01-04 NOTE — Patient Instructions (Signed)
Description   Called and spoke to [place], pt's daughter and instructed for pt to continue taking Warfarin 1 tablet (2mg ) daily. Recheck INR in 6 weeks-going to [place] will be with dtr Felipa Evener. Pt going from [place] to [place] and if she is will get lab checked at [facility]. Wrote pt's name down in the coumadin clinic follow up book.  Coumadin Clinic [phone].

## 2021-01-11 ENCOUNTER — Telehealth: Payer: Self-pay | Admitting: Family Medicine

## 2021-01-11 ENCOUNTER — Encounter (INDEPENDENT_AMBULATORY_CARE_PROVIDER_SITE_OTHER): Payer: Medicare Other | Admitting: Ophthalmology

## 2021-01-11 NOTE — Telephone Encounter (Signed)
Left message for patient to call back and schedule Medicare Annual Wellness Visit (AWV) either virtually or in office.   AWV-I per [place] 07/10/09 please schedule at anytime with [facility] Nurse Health Advisor 1 or 2   This should be a 45 minute visit. 

## 2021-01-19 ENCOUNTER — Ambulatory Visit: Payer: Medicare Other | Admitting: Cardiology

## 2021-01-31 ENCOUNTER — Telehealth: Payer: Self-pay | Admitting: Pharmacist

## 2021-01-31 NOTE — Chronic Care Management (AMB) (Signed)
Chronic Care Management Pharmacy Assistant   Name: Cynthia Mathis  MRN: [id number] DOB: 06/14/1933  Reason for Encounter: Medication Review-Medication Coordination Call  Recent office visits:  None  Recent consult visits:  None  Hospital visits:  None in previous 6 months  Medications: Outpatient Encounter Medications as of 01/31/2021  Medication Sig   anastrozole (ARIMIDEX) 1 MG tablet TAKE ONE TABLET BY MOUTH EVERY EVENING   Calcium Carbonate-Vitamin D 600-400 MG-UNIT tablet Take 1 tablet by mouth daily.   Cyanocobalamin (VITAMIN B 12) 500 MCG TABS Take 1,000 mcg by mouth daily.    diltiazem (CARDIZEM) 30 MG tablet Take 1 tablet every 4 hours AS NEEDED for afib heart rate >100 as long as top BP >100.   diphenoxylate-atropine (LOMOTIL) 2.5-0.025 MG tablet Take 1 tablet by mouth 4 (four) times daily as needed for diarrhea or loose stools.   hydrochlorothiazide (MICROZIDE) 12.5 MG capsule Take 1 capsule (12.5 mg total) by mouth daily.   losartan (COZAAR) 100 MG tablet Take 1 tablet (100 mg total) by mouth daily.   Magnesium Oxide 400 MG CAPS Take 1 capsule (400 mg total) by mouth daily.   metoprolol tartrate (LOPRESSOR) 25 MG tablet Take 0.5 tablets (12.5 mg total) by mouth 2 (two) times daily.   Multiple Vitamins-Minerals (MULTIVITAMIN & MINERAL PO) Take 1 tablet by mouth daily.   Psyllium (METAMUCIL PO) Take by mouth as needed.   warfarin (COUMADIN) 2 MG tablet TAKE ONE TABLET BY MOUTH DAILY OR AS DIRECTED BY THE COUMADIN CLINIC   No facility-administered encounter medications on file as of 01/31/2021.  Reviewed chart for medication changes ahead of medication coordination call.  No OVs, Consults, or hospital visits since last care coordination call/Pharmacist visit. No medication changes indicated OR if recent visit, treatment plan here.  BP Readings from Last 3 Encounters:  10/06/20 (!) 148/76  10/04/20 (!) 148/82  08/30/20 (!) 154/80    Lab Results  Component Value  Date   HGBA1C 7.4 (H) 06/11/2020    Patient obtains medications through Vials  30 Days  Last adherence delivery included:  Anastrozole (ARIMIDEX) 1 mg: one tablet at dinner Calcium Carbonate-Vitamin D 600-400 mg-unit: one at breakfast Cyanocobalamin (VITAMIN B 12) 1000 mcg: one tablet at breakfast Losartan (COZAAR) 100 MG tablet: one tablet at breakfast Hydrochlorothiazide (HYDRODIURIL) 12.5 MG capsule: one capsule at breakfast Metoprolol tartrate (LOPRESSOR) 25 mg:  tablet (12.5 mg) at breakfast and  at dinner Multiple Vitamins-Minerals: one tablet at breakfast Warfarin (Coumadin) 2 mg Take 1/2 tablet to 1 tablet by mouth daily as directed by the coumadin clinic. Magnesium Oxide 400 mg: one capsule daily  Patient declined the following medication last month due to PRN use/additional supply on hand. Diltiazem (CARDIZEM) 30 mg tablet PRN  Patient is due for next adherence delivery on: 07.272022. Called patient and reviewed medications and coordinated delivery. This delivery to include: Anastrozole (ARIMIDEX) 1 mg: one tablet at dinner Calcium Carbonate-Vitamin D 600-400 mg-unit: one at breakfast Cyanocobalamin (VITAMIN B 12) 1000 mcg: one tablet at breakfast Losartan (COZAAR) 100 MG tablet: one tablet at breakfast Hydrochlorothiazide (HYDRODIURIL) 12.5 MG capsule: one capsule at breakfast Metoprolol tartrate (LOPRESSOR) 25 mg:  tablet (12.5 mg) at breakfast and  at dinner Multiple Vitamins-Minerals: one tablet at breakfast Warfarin (Coumadin) 2 mg Take 1/2 tablet to 1 tablet by mouth daily as directed by the coumadin clinic. Magnesium Oxide 400 mg: one capsule daily  Patient declined the following medications due to the following reason Diltiazem (CARDIZEM) 30 mg  tablet PRN  She currently doe not nee refills Confirmed delivery date of 07.27.2022, advised patient that pharmacy will contact them the morning of delivery.  Care Gaps: Covid -19 4th Booster  Star Rating Drugs:   Dispensed Quantity Pharmacy  Losartan 100 mg 06.28.2022 30 [facility]    [name], [place] Pharmacist Assistant [phone]

## 2021-02-09 ENCOUNTER — Telehealth: Payer: Self-pay | Admitting: Family Medicine

## 2021-02-09 NOTE — Telephone Encounter (Signed)
Left message for patient to call back and schedule Medicare Annual Wellness Visit (AWV) either virtually or in office.   AWV-I per [place] 07/10/09 please schedule at anytime with [facility] Nurse Health Advisor 1 or 2   This should be a 45 minute visit. 

## 2021-02-11 ENCOUNTER — Telehealth: Payer: Self-pay

## 2021-02-11 NOTE — Telephone Encounter (Signed)
Pt was due for INR on 02/10/21 at lab in [place].  Called pt's daughter [name] who pt is staying with.  She states pt is going to be residing with her in [place] permanently.  They have as of Monday 02/07/21 switched pt's Medicare insurance from [employer] [place] to [place].  Pt has upcoming appt with new PCP in [place] on 03/06/21, they advised pt to have INR checked at Urgent Care, they sent pt to ED for INR check.  Pt's INR was 1.8 on 8/2 at ED in [place] they have reported result to new PCP and are awaiting sooner appt with them and dosing instructions.  Daughter reports they are a plant based family and they eat lots of vegetables, so pt's diet has increased in Vit K foods.  Advised to report this to new PCP and gave last INR results and dates, as well as current dosing regimen so she could relay to new PCP.  Advised to call with questions or problems.  Will discontinue pt's anticoag episode since she is now residing in [place] and having INR monitored and managed there.

## 2021-02-21 ENCOUNTER — Other Ambulatory Visit: Payer: Self-pay

## 2021-02-21 MED ORDER — WARFARIN SODIUM 2 MG PO TABS: ORAL_TABLET | ORAL | 1 refills | Status: DC

## 2021-02-21 NOTE — Telephone Encounter (Signed)
Prescription refill request received for warfarin [place]: 08/30/20 ([facility]) Next INR check: 8/5 [facility]  Warfarin tablet strength: '2mg'$   Appropriate dose and refill sent to requested pharmacy.

## 2021-02-21 NOTE — Telephone Encounter (Signed)
Patient's son called back and stated that his mother is out of state with his sister for an extended amount of time so they will skip the AWV for right now.

## 2021-02-25 ENCOUNTER — Telehealth: Payer: Self-pay | Admitting: Pharmacist

## 2021-02-25 NOTE — Chronic Care Management (AMB) (Signed)
Chronic Care Management Pharmacy Assistant   Name: Kianni Schnorr  MRN: [id number] DOB: 1933-02-03  Reason for Encounter: Medication Review/ Medication Coordination Call   Recent office visits:  None  Recent consult visits:  None  Hospital visits:  None in previous 6 months  Medications: Outpatient Encounter Medications as of 02/25/2021  Medication Sig   anastrozole (ARIMIDEX) 1 MG tablet TAKE ONE TABLET BY MOUTH EVERY EVENING   Calcium Carbonate-Vitamin D 600-400 MG-UNIT tablet Take 1 tablet by mouth daily.   Cyanocobalamin (VITAMIN B 12) 500 MCG TABS Take 1,000 mcg by mouth daily.    diltiazem (CARDIZEM) 30 MG tablet Take 1 tablet every 4 hours AS NEEDED for afib heart rate >100 as long as top BP >100.   diphenoxylate-atropine (LOMOTIL) 2.5-0.025 MG tablet Take 1 tablet by mouth 4 (four) times daily as needed for diarrhea or loose stools.   hydrochlorothiazide (MICROZIDE) 12.5 MG capsule Take 1 capsule (12.5 mg total) by mouth daily.   losartan (COZAAR) 100 MG tablet Take 1 tablet (100 mg total) by mouth daily.   Magnesium Oxide 400 MG CAPS Take 1 capsule (400 mg total) by mouth daily.   metoprolol tartrate (LOPRESSOR) 25 MG tablet Take 0.5 tablets (12.5 mg total) by mouth 2 (two) times daily.   Multiple Vitamins-Minerals (MULTIVITAMIN & MINERAL PO) Take 1 tablet by mouth daily.   Psyllium (METAMUCIL PO) Take by mouth as needed.   warfarin (COUMADIN) 2 MG tablet TAKE ONE TABLET BY MOUTH DAILY OR AS DIRECTED BY THE COUMADIN CLINIC   No facility-administered encounter medications on file as of 02/25/2021.   Fill History: anastrozole 1 mg tablet 02/01/2021 30   DIPHEN/ATROP TAB UPSH 12/17/2020 7   losartan 100 mg tablet 02/01/2021 30   magnesium oxide 400 mg (241.3 mg magnesium) tablet 02/01/2021 30   metoprolol tartrate 25 mg tablet 02/01/2021 30   diltiazem 30 mg tablet 04/23/2020 8   hydrochlorothiazide 12.5 mg capsule 02/01/2021 30   Reviewed chart for  medication changes ahead of medication coordination call.  No OVs, Consults, or hospital visits since last care coordination call/Pharmacist visit. (If appropriate, list visit date, provider name)  No medication changes indicated OR if recent visit, treatment plan here.  BP Readings from Last 3 Encounters:  10/06/20 (!) 148/76  10/04/20 (!) 148/82  08/30/20 (!) 154/80    Lab Results  Component Value Date   HGBA1C 7.4 (H) 06/11/2020     Patient obtains medications through Vials  30 Days   Last adherence delivery included: Anastrozole (ARIMIDEX) 1 mg: one tablet at dinner Calcium Carbonate-Vitamin D 600-400 mg-unit: one at breakfast Cyanocobalamin (VITAMIN B 12) 1000 mcg: one tablet at breakfast Losartan (COZAAR) 100 MG tablet: one tablet at breakfast Hydrochlorothiazide (HYDRODIURIL) 12.5 MG capsule: one capsule at breakfast Metoprolol tartrate (LOPRESSOR) 25 mg:  tablet (12.5 mg) at breakfast and  at dinner Multiple Vitamins-Minerals: one tablet at breakfast Warfarin (Coumadin) 2 mg Take 1/2 tablet to 1 tablet by mouth daily as directed by the coumadin clinic. Magnesium Oxide 400 mg: one capsule daily    Patient declined (meds) last month due to PRN use. Diltiazem (CARDIZEM) 30 mg tablet PRN  Patient is due for next adherence delivery on: 03/03/2021. Called patient and reviewed medications and coordinated delivery.  This delivery to include: Anastrozole (ARIMIDEX) 1 mg: one tablet at dinner Calcium Carbonate-Vitamin D 600-400 mg-unit: one at breakfast Cyanocobalamin (VITAMIN B 12) 1000 mcg: one tablet at breakfast Losartan (COZAAR) 100 MG tablet: one tablet at  breakfast Hydrochlorothiazide (HYDRODIURIL) 12.5 MG capsule: one capsule at breakfast Metoprolol tartrate (LOPRESSOR) 25 mg:  tablet (12.5 mg) at breakfast and  at dinner Multiple Vitamins-Minerals: one tablet at breakfast Warfarin (Coumadin) 2 mg Take 1/2 tablet to 1 tablet by mouth daily as directed by the  coumadin clinic. Magnesium Oxide 400 mg: one capsule daily  Patient will need a short fill of (med), prior to adherence delivery. (To align with sync date or if PRN med)  Coordinated acute fill for (med) to be delivered (date).  Patient declined the following medications (meds) due to (reason) Diltiazem (CARDIZEM) 30 mg tablet PRN  Patient needs refills for .  Confirmed delivery date of 03/03/2021, advised patient that pharmacy will contact them the morning of delivery.   Multiple unsuccessful attempts to reach patient by phone.  Care Gaps:  AWV - Message sent to Ramond Craver, CMA to schedule. Covid-19 - Dose 4 overdue since 08/29/2020. Flu - due 02/07/2021  Star Rating Drugs:  Losartan '100mg'$  - Last filled on 02/01/2021 for 30DS at [facility] Pharmacist Assistant [phone]

## 2021-02-28 NOTE — Telephone Encounter (Cosign Needed)
2nd attempt

## 2021-03-01 NOTE — Telephone Encounter (Cosign Needed)
3rd attempt

## 2021-04-05 ENCOUNTER — Telehealth: Payer: Medicare Other

## 2021-05-15 NOTE — Progress Notes (Signed)
[facility]  [phone] [phone]     ID: Cynthia Mathis DOB: 21-Apr-1933  MR#: [id number]  QIW#:[id number]  Patient Care Team: [name], MD as PCP - General (Family Medicine) [name], MD (Inactive) as PCP - Cardiology (Cardiology) Nikoloz Huy, [name], MD as Consulting Physician (Oncology) [name], MD as Consulting Physician (General Surgery) [name], RN as Oncology Nurse Navigator [name], MD (Inactive) as Consulting Physician (Cardiology) Newdale, Mellen, PA-C (Inactive) (Dermatology) [name], [facility] as Pharmacist (Pharmacist) Chauncey Cruel, MD OTHER MD:  CHIEF COMPLAINT: estrogen receptor positive breast cancer  CURRENT TREATMENT: anastrozole   INTERVAL HISTORY: Cynthia "Cynthia Mathis" was scheduled today for follow up of her estrogen receptor positive breast cancer, however she did not show    Her most recent bone density screening on 06/16/2019 showed a T-score of -0.7, which is considered normal.  Since her last visit, she underwent bilateral diagnostic mammography with tomography at [facility] on 06/01/2020 showing: breast density category B; no evidence of malignancy in either breast.    REVIEW OF SYSTEMS: Cynthia Mathis    COVID 19 VACCINATION STATUS:    HISTORY OF CURRENT ILLNESS: From the original intake note:  Cynthia Mathis had routine screening mammography at [facility] on 02/18/2019 showing a possible abnormality in the right breast. She underwent right diagnostic mammography with tomography and right breast ultrasonography at [facility] on 02/25/2019 showing: breast density category B; 0.6 cm irregular mass at 11 o'clock in the right breast; no abnormal right axillary lymph nodes.  Accordingly on 03/05/2019 she proceeded to biopsy of the right breast area in question. The pathology from this procedure ([id number]) showed: invasive ductal carcinoma, grade 1. Prognostic indicators significant  for: estrogen receptor, 95% positive and progesterone receptor, 95% positive, both with strong staining intensity. Proliferation marker Ki67 at 10%. HER2 negative by immunohistochemistry (1+).  The patient's subsequent history is as detailed below.   PAST MEDICAL HISTORY: Past Medical History:  Diagnosis Date   Anxiety    Borderline diabetes mellitus    Cancer ([place]) 2020   breast   Diverticulosis of colon    DJD (degenerative joint disease)    Dyslipidemia    History of hematuria    History of osteoporosis    Hx of colonic polyps    Hypertension    Hypothyroid    Macular degeneration    sees [name] for general eye care, sees [name] for retinal injections    Memory loss    NSVT (nonsustained ventricular tachycardia) ([place])    Osteoporosis    took bisphosphonates, now off. Last DEXA in 2015 showed osteopenia    Paroxysmal atrial fibrillation ([place])    sees [name]    SCCA (squamous cell carcinoma) of skin 12/11/2019   Left Hand Posterior (in situ)    PAST SURGICAL HISTORY: Past Surgical History:  Procedure Laterality Date   APPENDECTOMY  1947   BREAST LUMPECTOMY WITH RADIOACTIVE SEED LOCALIZATION Right 05/14/2019   Procedure: RIGHT BREAST LUMPECTOMY WITH RADIOACTIVE SEED LOCALIZATION;  Surgeon: [name], MD;  Location: [facility] OR;  Service: General;  Laterality: Right;   CATARACT EXTRACTION W/PHACO Bilateral    COLONOSCOPY  12/02/2014   per [name], severe diverticulosis but no polyps    MASS EXCISION  04/17/2012   Procedure: EXCISION MASS;  Surgeon: [name], MD;  Location: [facility];  Service: General;  Laterality: N/A;   ORIF ANKLE FRACTURE Right 06/14/2020   Procedure:  OPEN REDUCTION INTERNAL FIXATION (ORIF) ANKLE FRACTURE;  Surgeon: [name], MD;  Location: [facility];  Service: Orthopedics;  Laterality: Right;   THYROIDECTOMY  1990   [name]    FAMILY HISTORY: Family History  Problem Relation Age of Onset    Stroke Mother    Colon cancer Neg Hx    Stomach cancer Neg Hx   Patient's father was 31 years old when he died in [place] War II. Patient's mother died from causes not clear to the patient at age 16.  The patient had 1 brother, no sisters.  The patient denies/or notes a family hx of breast or ovarian cancer.     GYNECOLOGIC HISTORY:  No LMP recorded. Patient is postmenopausal. Menarche: 87 years old Age at first live birth: 87 years old [place] P 3 LMP early 71s Contraceptive no HRT no  Hysterectomy?  No BSO?  No   SOCIAL HISTORY: (updated September 2020)  Cynthia "Alan Mulder" was born in [place], in [place].  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  Cynthia Mathis came to the states with him and after living in [place] for some time they moved to [place] where her husband had a job.  He was an Training and development officer.  Their 3 children are [name] 88 who lives in [place] and is an Futures trader; [name] 32 lives in [place] and keeps a day school in her home; and [name] in [place] who works with [employer]. The patient has 7 grandchildren, no great grandchildren.  She is not a Ambulance person.   ADVANCED DIRECTIVES:    HEALTH MAINTENANCE: Social History   Tobacco Use   Smoking status: Former    Types: Cigarettes    Quit date: 07/10/1965    Years since quitting: 55.8   Smokeless tobacco: Never   Tobacco comments:    3 cigs per week x 3 years (started in 1964), 12/27/15 does not smoke  Vaping Use   Vaping Use: Never used  Substance Use Topics   Alcohol use: Yes    Alcohol/week: 3.0 standard drinks    Types: 3 Glasses of wine per week    Comment: occasionally   Drug use: No     Colonoscopy: 2016/[facility]  PAP: Up-to-date  Bone density: 06/2019, -0.7   Allergies  Allergen Reactions   Epinephrine Palpitations    heart racing     Current Outpatient Medications  Medication Sig Dispense Refill   anastrozole (ARIMIDEX) 1 MG tablet TAKE ONE TABLET BY MOUTH EVERY EVENING 90 tablet 0   Calcium Carbonate-Vitamin D 600-400 MG-UNIT tablet Take 1 tablet by mouth daily.     Cyanocobalamin (VITAMIN B 12) 500 MCG TABS Take 1,000 mcg by mouth daily.      diltiazem (CARDIZEM) 30 MG tablet Take 1 tablet every 4 hours AS NEEDED for afib heart rate >100 as long as top BP >100. 45 tablet 1   diphenoxylate-atropine (LOMOTIL) 2.5-0.025 MG tablet Take 1 tablet by mouth 4 (four) times daily as needed for diarrhea or loose stools. 30 tablet 1   hydrochlorothiazide (MICROZIDE) 12.5 MG capsule Take 1 capsule (12.5 mg total) by mouth daily. 90 capsule 3   losartan (COZAAR) 100 MG tablet Take 1 tablet (100 mg total) by mouth daily. 90 tablet 4   Magnesium Oxide 400 MG CAPS Take 1 capsule (  400 mg total) by mouth daily. 30 capsule 11   metoprolol tartrate (LOPRESSOR) 25 MG tablet Take 0.5 tablets (12.5 mg total) by mouth 2 (two) times daily. 30 tablet 11   Multiple Vitamins-Minerals (MULTIVITAMIN & MINERAL PO) Take 1 tablet by mouth daily.     Psyllium (METAMUCIL PO) Take by mouth as needed.     warfarin (COUMADIN) 2 MG tablet TAKE ONE TABLET BY MOUTH DAILY OR AS DIRECTED BY THE COUMADIN CLINIC 35 tablet 1   No current facility-administered medications for this visit.    OBJECTIVE: White woman who appears younger than stated age  There were no vitals filed for this visit.    There is no height or weight on file to calculate BMI.   Wt Readings from Last 3 Encounters:  08/30/20 146 lb 3.2 oz (66.3 kg)  08/26/20 145 lb (65.8 kg)  08/13/20 145 lb 12.8 oz (66.1 kg)     ECOG FS:1 - Symptomatic but completely ambulatory    LAB RESULTS:  CMP     Component Value Date/Time   NA 139 09/09/2020 0943   K 4.6 09/09/2020 0943   CL 98 09/09/2020 0943   CO2 20 09/09/2020 0943   GLUCOSE 269 (H) 09/09/2020 0943   GLUCOSE 173 (H) 06/11/2020  0333   BUN 23 09/09/2020 0943   CREATININE 1.08 (H) 09/09/2020 0943   CREATININE 1.06 (H) 03/26/2019 1521   CALCIUM 10.0 09/09/2020 0943   PROT 7.0 05/10/2020 1136   PROT 7.0 02/05/2019 0852   ALBUMIN 3.7 05/10/2020 1136   ALBUMIN 4.4 02/05/2019 0852   AST 21 05/10/2020 1136   AST 17 03/26/2019 1521   ALT 22 05/10/2020 1136   ALT 17 03/26/2019 1521   ALKPHOS 60 05/10/2020 1136   BILITOT 0.6 05/10/2020 1136   BILITOT 0.3 03/26/2019 1521   GFRNONAA 41 (L) 08/26/2020 1116   GFRNONAA 59 (L) 06/11/2020 0333   GFRNONAA 48 (L) 03/26/2019 1521   GFRAA 47 (L) 08/26/2020 1116   GFRAA 56 (L) 03/26/2019 1521    No results found for: TOTALPROTELP, ALBUMINELP, A1GS, A2GS, BETS, BETA2SER, GAMS, MSPIKE, SPEI  No results found for: KPAFRELGTCHN, LAMBDASER, KAPLAMBRATIO  Lab Results  Component Value Date   WBC 11.9 (H) 06/19/2020   NEUTROABS 11.1 (H) 06/10/2020   HGB 12.8 06/19/2020   HCT 37.4 06/19/2020   MCV 91.2 06/19/2020   PLT 267 06/19/2020   No results found for: LABCA2  No components found for: [id number]  No results for input(s): INR in the last 168 hours.  No results found for: LABCA2  No results found for: HWE993  No results found for: ZJI967  No results found for: ELF810  No results found for: CA2729  No components found for: HGQUANT  No results found for: CEA1 / No results found for: CEA1   No results found for: AFPTUMOR  No results found for: CHROMOGRNA  No results found for: HGBA, HGBA2QUANT, HGBFQUANT, HGBSQUAN (Hemoglobinopathy evaluation)   No results found for: LDH  Lab Results  Component Value Date   IRON 72 05/15/2012   IRONPCTSAT 24.6 05/15/2012   (Iron and TIBC)  No results found for: FERRITIN  Urinalysis    Component Value Date/Time   COLORURINE YELLOW 06/10/2020 1518   APPEARANCEUR HAZY (A) 06/10/2020 1518   LABSPEC 1.017 06/10/2020 1518   PHURINE 5.0 06/10/2020 1518   GLUCOSEU NEGATIVE 06/10/2020 [place]  11/12/2007 0945   HGBUR SMALL (A) 06/10/2020 1518   BILIRUBINUR NEGATIVE 06/10/2020  [place] negative 02/12/2019 1056   [place] 06/10/2020 1518   PROTEINUR NEGATIVE 06/10/2020 1518   UROBILINOGEN 0.2 02/12/2019 1056   UROBILINOGEN 0.2 mg/dL 11/12/2007 0945   NITRITE POSITIVE (A) 06/10/2020 1518   LEUKOCYTESUR MODERATE (A) 06/10/2020 1518    STUDIES: No results found.   ELIGIBLE FOR AVAILABLE RESEARCH PROTOCOL: no  ASSESSMENT: 87 y.o. [place] woman status post right breast upper inner quadrant biopsy 03/05/2019 for a clinical T1b N0, stage IA invasive ductal carcinoma, grade 1, estrogen and progesterone receptor positive, HER-2 not amplified, with an MIB-1 of 10%  (1) status post right lumpectomy with no sentinel lymph node sampling 05/14/2019 for a pT1b pNX, stage IA invasive ductal carcinoma, grade 1, with negative margins  (2) started anastrozole November 2020  (a) bone density 06/16/2019 normal (T -0.7)   PLAN: "Cynthia Mathis" did not show for her scheduled visit 05/16/2021.  It may be that there are some communications or transportation problems.  I am going to set her up for a virtual visit and see if that is more feasible for her    [name]. Derryck Shahan, MD  05/16/2021 6:23 PM Medical Oncology and Hematology [facility] [place], [place] [address] Tel. [phone]    Fax. [phone]   I, Wilburn Mylar, am acting as scribe for Dr. [name]. Cynthia Mathis.  I, [name] MD, have reviewed the above documentation for accuracy and completeness, and I agree with the above.   *Total Encounter Time as defined by the Centers for Medicare and Medicaid Services includes, in addition to the face-to-face time of a patient visit (documented in the note above) non-face-to-face time: obtaining and reviewing outside history, ordering and reviewing medications, tests or procedures, care coordination (communications with other health care  professionals or caregivers) and documentation in the medical record.

## 2021-05-16 ENCOUNTER — Ambulatory Visit (HOSPITAL_BASED_OUTPATIENT_CLINIC_OR_DEPARTMENT_OTHER): Payer: Medicare Other | Admitting: Oncology

## 2021-05-16 ENCOUNTER — Other Ambulatory Visit: Payer: Medicare Other

## 2021-05-16 DIAGNOSIS — C50411 Malignant neoplasm of upper-outer quadrant of right female breast: Secondary | ICD-10-CM

## 2021-05-16 DIAGNOSIS — Z170 Estrogen receptor positive status [ER+]: Secondary | ICD-10-CM

## 2021-06-07 ENCOUNTER — Telehealth (HOSPITAL_BASED_OUTPATIENT_CLINIC_OR_DEPARTMENT_OTHER): Payer: Medicare Other | Admitting: Adult Health

## 2021-06-07 DIAGNOSIS — Z91199 Patient's noncompliance with other medical treatment and regimen due to unspecified reason: Secondary | ICD-10-CM

## 2021-06-07 NOTE — Progress Notes (Signed)
  Patient did not show for appointment.  This is the second no show.  Letter mailed, social work referral made.  Wilber Bihari, NP

## 2021-06-10 ENCOUNTER — Encounter: Payer: Self-pay | Admitting: *Deleted

## 2021-06-10 NOTE — Progress Notes (Signed)
[place] Work  Clinical Social Work was referred by Survivorship NP for assessment of psychosocial needs- potential barriers causing patient to miss multiple appointments.  Clinical Social Worker unable to reach patient  to offer support and assess for needs. CSW spoke with patient's son, [name].  He shared his mother has moved to [place] to live with her daughter. She has no social barriers.    Gwinda Maine, LCSW  Clinical Social Worker [facility]

## 2021-06-13 ENCOUNTER — Other Ambulatory Visit: Payer: Self-pay | Admitting: Oncology

## 2021-06-13 NOTE — Progress Notes (Signed)
[name], LCSW  Causey, [name], NP; Loria Lacina, [name], MD [name],  I got your referral for social barriers causing possible missed appointments. I talked to patient's son, he said his mother moved to [place] to live with her daughter. They plan to call us if they need help getting records sent to a new provider there.   This patient has no social needs/barriers.   Cynthia Mathis

## 2021-07-29 ENCOUNTER — Telehealth: Payer: Self-pay | Admitting: Family Medicine

## 2021-07-29 NOTE — Telephone Encounter (Signed)
The letter is ready  

## 2021-07-29 NOTE — Telephone Encounter (Signed)
Patient son [name] called in requesting a formal letter from Dr.Fry. The note should include the date that patient was diagnosed with memory loss and the medication that was  prescribed to that patient before and during that time.  [name] could be contacted at [phone].  [name] would like to have form mailed to him at the following address:  [place], [place] [address]   Please advise.

## 2021-07-29 NOTE — Telephone Encounter (Signed)
Please advise 

## 2021-07-29 NOTE — Telephone Encounter (Signed)
Spoke with pt son [name], advised to mail out the letter to the address provided on another encounter. Letter mailed out on 07/29/21

## 2021-12-06 DIAGNOSIS — I639 Cerebral infarction, unspecified: Secondary | ICD-10-CM | POA: Diagnosis not present

## 2021-12-06 DIAGNOSIS — I6523 Occlusion and stenosis of bilateral carotid arteries: Secondary | ICD-10-CM | POA: Diagnosis not present

## 2021-12-06 DIAGNOSIS — Z7901 Long term (current) use of anticoagulants: Secondary | ICD-10-CM | POA: Diagnosis not present

## 2021-12-06 DIAGNOSIS — R9082 White matter disease, unspecified: Secondary | ICD-10-CM | POA: Diagnosis not present

## 2021-12-06 DIAGNOSIS — R319 Hematuria, unspecified: Secondary | ICD-10-CM | POA: Diagnosis not present

## 2021-12-06 DIAGNOSIS — I6782 Cerebral ischemia: Secondary | ICD-10-CM | POA: Diagnosis not present

## 2021-12-06 DIAGNOSIS — Z853 Personal history of malignant neoplasm of breast: Secondary | ICD-10-CM | POA: Diagnosis not present

## 2021-12-06 DIAGNOSIS — I6359 Cerebral infarction due to unspecified occlusion or stenosis of other cerebral artery: Secondary | ICD-10-CM | POA: Diagnosis not present

## 2021-12-06 DIAGNOSIS — R4781 Slurred speech: Secondary | ICD-10-CM | POA: Diagnosis not present

## 2021-12-06 DIAGNOSIS — I63512 Cerebral infarction due to unspecified occlusion or stenosis of left middle cerebral artery: Secondary | ICD-10-CM | POA: Diagnosis not present

## 2021-12-06 DIAGNOSIS — I4891 Unspecified atrial fibrillation: Secondary | ICD-10-CM | POA: Diagnosis not present

## 2021-12-06 DIAGNOSIS — I10 Essential (primary) hypertension: Secondary | ICD-10-CM | POA: Diagnosis not present

## 2021-12-06 DIAGNOSIS — E785 Hyperlipidemia, unspecified: Secondary | ICD-10-CM | POA: Diagnosis not present

## 2021-12-06 DIAGNOSIS — D72820 Lymphocytosis (symptomatic): Secondary | ICD-10-CM | POA: Diagnosis not present

## 2021-12-06 DIAGNOSIS — R4701 Aphasia: Secondary | ICD-10-CM | POA: Diagnosis not present

## 2021-12-06 DIAGNOSIS — D72821 Monocytosis (symptomatic): Secondary | ICD-10-CM | POA: Diagnosis not present

## 2021-12-06 DIAGNOSIS — I6389 Other cerebral infarction: Secondary | ICD-10-CM | POA: Diagnosis not present

## 2021-12-06 DIAGNOSIS — I6603 Occlusion and stenosis of bilateral middle cerebral arteries: Secondary | ICD-10-CM | POA: Diagnosis not present

## 2021-12-06 DIAGNOSIS — N390 Urinary tract infection, site not specified: Secondary | ICD-10-CM | POA: Diagnosis not present

## 2021-12-06 HISTORY — DX: Cerebral infarction, unspecified: I63.9

## 2022-01-02 DIAGNOSIS — H353222 Exudative age-related macular degeneration, left eye, with inactive choroidal neovascularization: Secondary | ICD-10-CM | POA: Diagnosis not present

## 2022-01-02 DIAGNOSIS — H353211 Exudative age-related macular degeneration, right eye, with active choroidal neovascularization: Secondary | ICD-10-CM | POA: Diagnosis not present

## 2022-02-01 DIAGNOSIS — C50919 Malignant neoplasm of unspecified site of unspecified female breast: Secondary | ICD-10-CM | POA: Diagnosis not present

## 2022-02-01 DIAGNOSIS — I10 Essential (primary) hypertension: Secondary | ICD-10-CM | POA: Diagnosis not present

## 2022-02-01 DIAGNOSIS — E1165 Type 2 diabetes mellitus with hyperglycemia: Secondary | ICD-10-CM | POA: Diagnosis not present

## 2022-02-01 DIAGNOSIS — I4811 Longstanding persistent atrial fibrillation: Secondary | ICD-10-CM | POA: Diagnosis not present

## 2022-02-01 DIAGNOSIS — G301 Alzheimer's disease with late onset: Secondary | ICD-10-CM | POA: Diagnosis not present

## 2022-02-01 DIAGNOSIS — H353290 Exudative age-related macular degeneration, unspecified eye, stage unspecified: Secondary | ICD-10-CM | POA: Diagnosis not present

## 2022-02-01 DIAGNOSIS — I4891 Unspecified atrial fibrillation: Secondary | ICD-10-CM | POA: Diagnosis not present

## 2022-02-13 DIAGNOSIS — H43813 Vitreous degeneration, bilateral: Secondary | ICD-10-CM | POA: Diagnosis not present

## 2022-02-13 DIAGNOSIS — H353222 Exudative age-related macular degeneration, left eye, with inactive choroidal neovascularization: Secondary | ICD-10-CM | POA: Diagnosis not present

## 2022-02-13 DIAGNOSIS — H353211 Exudative age-related macular degeneration, right eye, with active choroidal neovascularization: Secondary | ICD-10-CM | POA: Diagnosis not present

## 2022-04-03 DIAGNOSIS — J0100 Acute maxillary sinusitis, unspecified: Secondary | ICD-10-CM | POA: Diagnosis not present

## 2022-04-03 DIAGNOSIS — R051 Acute cough: Secondary | ICD-10-CM | POA: Diagnosis not present

## 2022-04-03 DIAGNOSIS — R0982 Postnasal drip: Secondary | ICD-10-CM | POA: Diagnosis not present

## 2022-04-24 DIAGNOSIS — H353222 Exudative age-related macular degeneration, left eye, with inactive choroidal neovascularization: Secondary | ICD-10-CM | POA: Diagnosis not present

## 2022-04-24 DIAGNOSIS — H43813 Vitreous degeneration, bilateral: Secondary | ICD-10-CM | POA: Diagnosis not present

## 2022-04-24 DIAGNOSIS — H353211 Exudative age-related macular degeneration, right eye, with active choroidal neovascularization: Secondary | ICD-10-CM | POA: Diagnosis not present

## 2022-08-10 DIAGNOSIS — H353222 Exudative age-related macular degeneration, left eye, with inactive choroidal neovascularization: Secondary | ICD-10-CM | POA: Diagnosis not present

## 2022-08-10 DIAGNOSIS — H353211 Exudative age-related macular degeneration, right eye, with active choroidal neovascularization: Secondary | ICD-10-CM | POA: Diagnosis not present

## 2022-08-17 ENCOUNTER — Encounter (HOSPITAL_COMMUNITY): Payer: Self-pay | Admitting: *Deleted

## 2022-08-29 DIAGNOSIS — E1165 Type 2 diabetes mellitus with hyperglycemia: Secondary | ICD-10-CM | POA: Diagnosis not present

## 2022-08-29 DIAGNOSIS — I4891 Unspecified atrial fibrillation: Secondary | ICD-10-CM | POA: Diagnosis not present

## 2022-08-29 DIAGNOSIS — G301 Alzheimer's disease with late onset: Secondary | ICD-10-CM | POA: Diagnosis not present

## 2022-08-29 DIAGNOSIS — H353290 Exudative age-related macular degeneration, unspecified eye, stage unspecified: Secondary | ICD-10-CM | POA: Diagnosis not present

## 2022-08-29 DIAGNOSIS — C50919 Malignant neoplasm of unspecified site of unspecified female breast: Secondary | ICD-10-CM | POA: Diagnosis not present

## 2022-09-10 IMAGING — CT CT ANKLE*R* W/O CM
3 of 5 series · 14 of 34 positions shown, 16 images · non-contrast
Comparison: Plain films right ankle earlier today.

CLINICAL DATA: The patient suffered right ankle fractures in a fall
today. Initial encounter.

EXAM:
CT OF THE RIGHT ANKLE WITHOUT CONTRAST
TECHNIQUE: Multidetector CT imaging of the right ankle was performed according
to the standard protocol. Multiplanar CT image reconstructions were
also generated.

[Series 4: extremity soft tissue · axial · 0.32mm/px · z∈[+444,+576]mm · 8 of 78 slices shown, 10 images]
[im 6/78  soft-tissue]
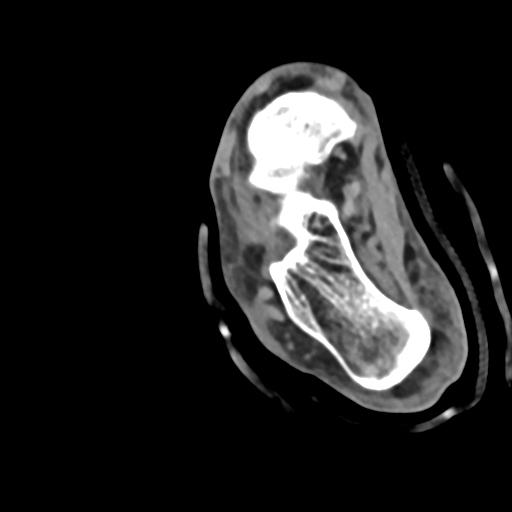
[im 6/78  bone]
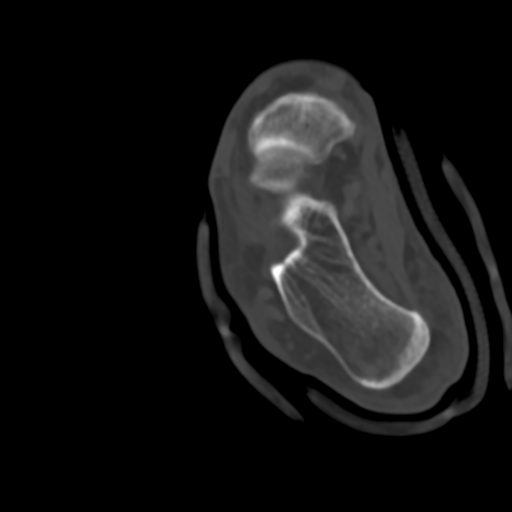
[im 18/78  bone]
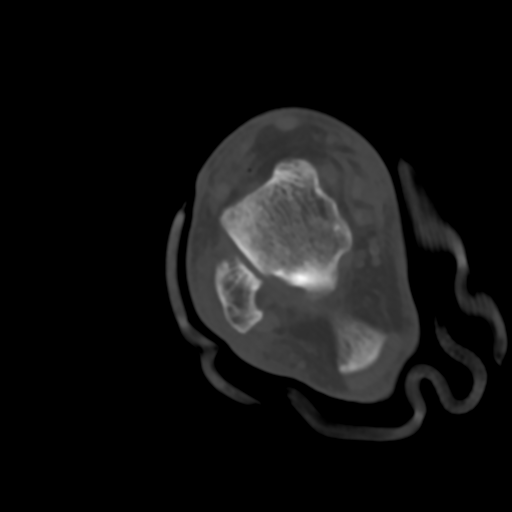
[im 24/78  bone]
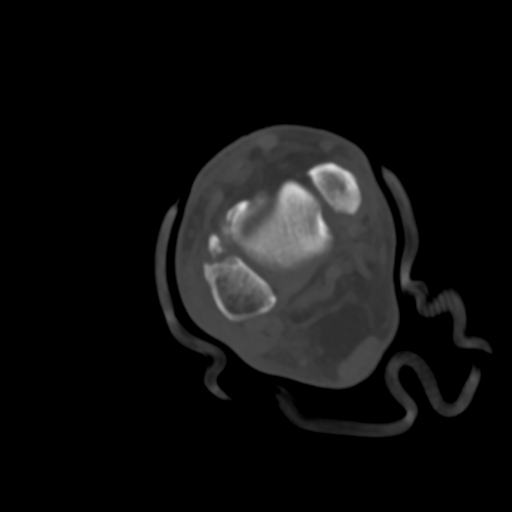
[im 36/78  bone]
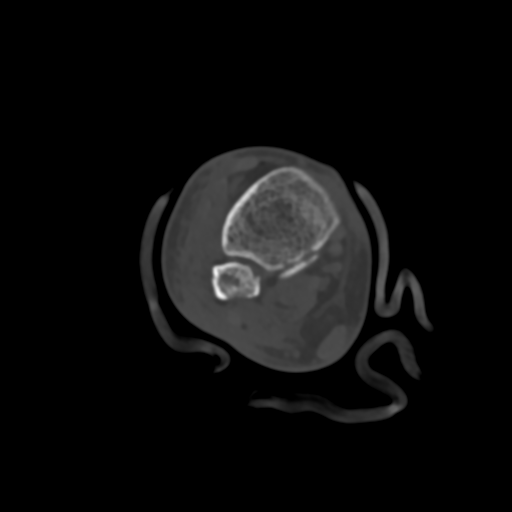
[im 42/78  soft-tissue]
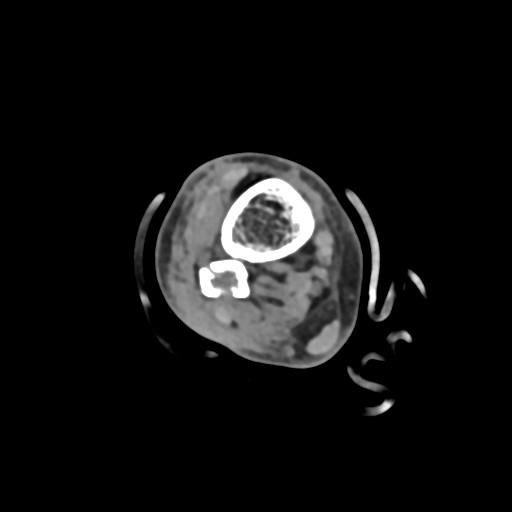
[im 42/78  bone]
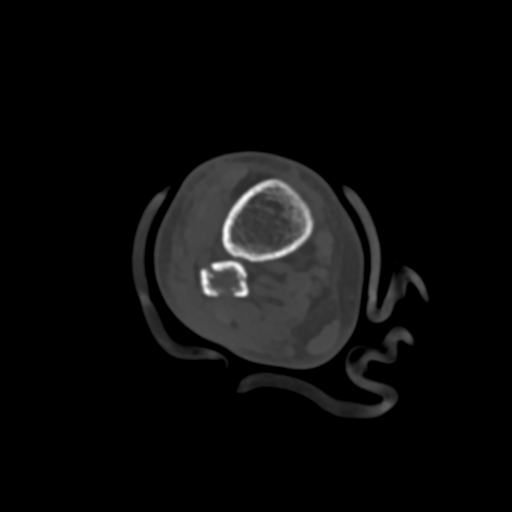
[im 54/78  bone]
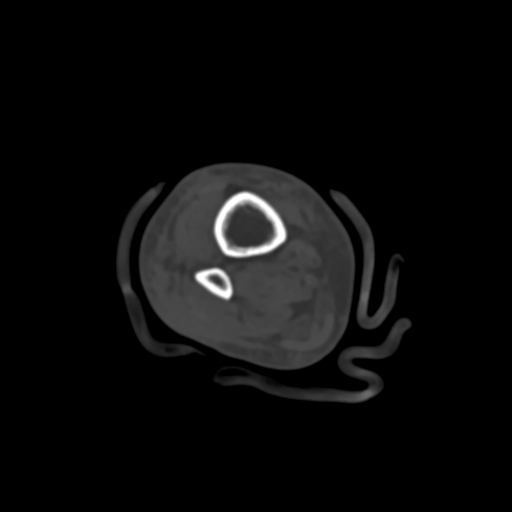
[im 60/78  bone]
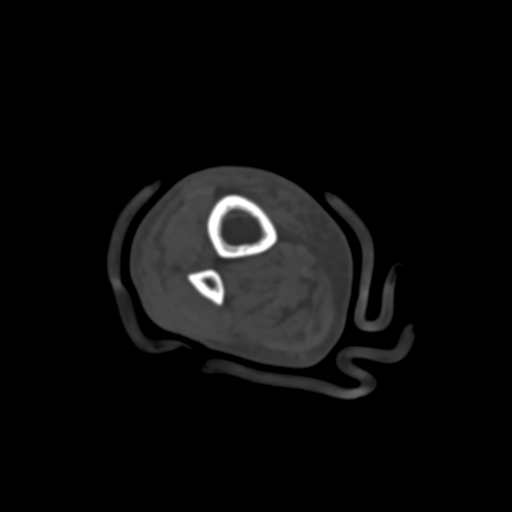
[im 72/78  bone]
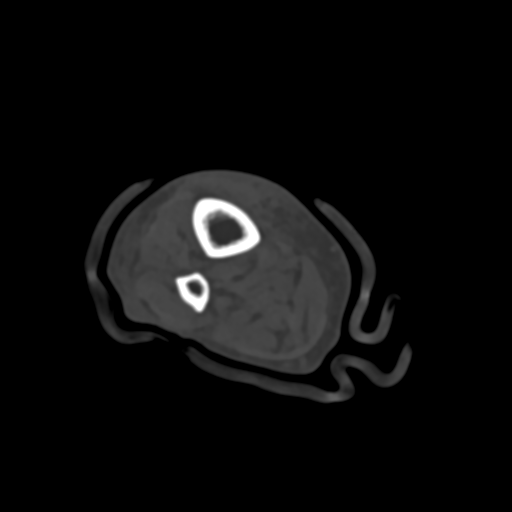

[Series 6: cor bone · coronal · 0.28mm/px · 1 of 67 slices shown]
[im 34/67  bone]
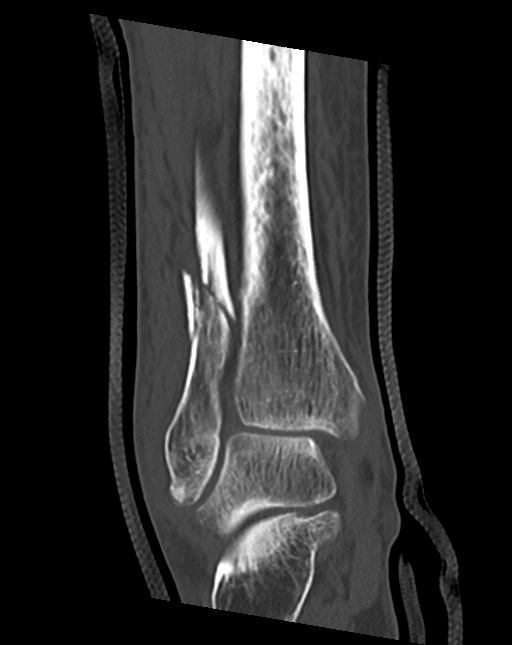

[Series 10: sag bone · sagittal · 0.26mm/px · 5 of 72 slices shown]
[im 15/72  bone]
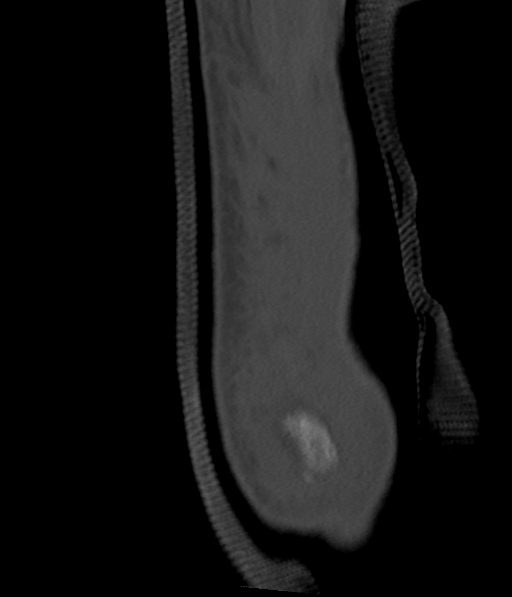
[im 29/72  bone]
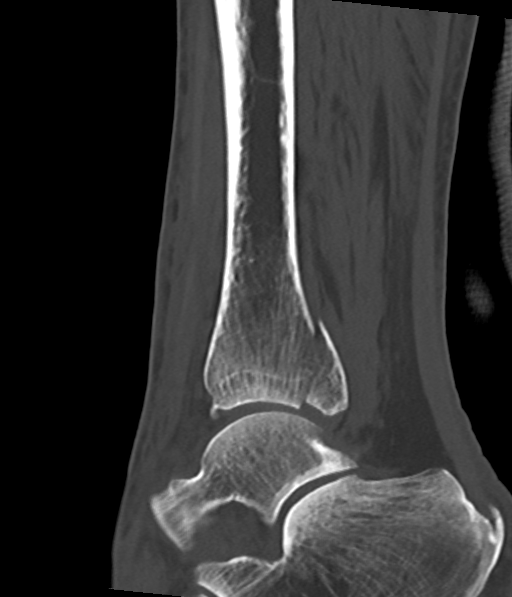
[im 43/72  bone]
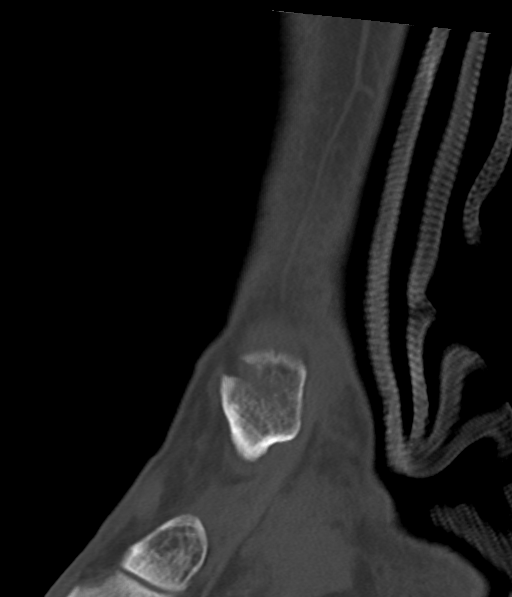
[im 54/72  soft-tissue]
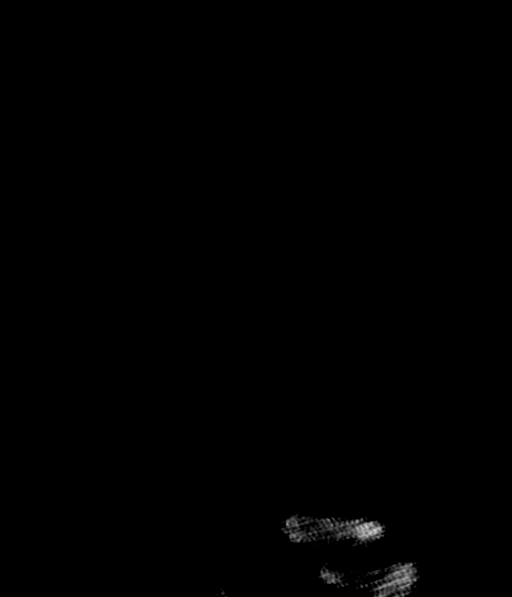
[im 57/72  bone]
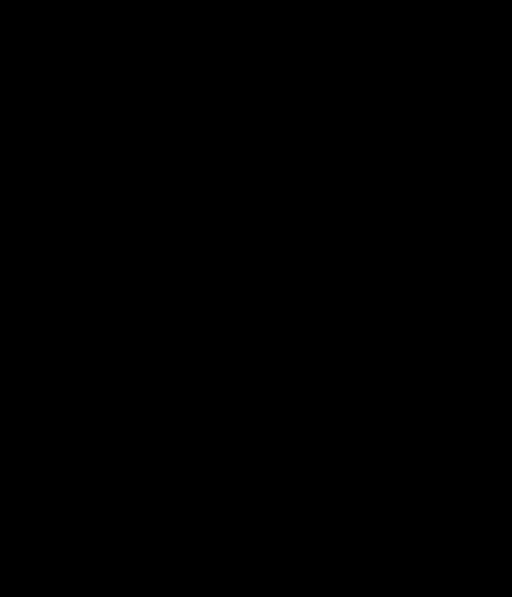

[14 of 34 positions shown; findings below may reference images not displayed]

FINDINGS: Bones/Joint/Cartilage

Acute fracture of the posterior malleolus is seen. The fracture
fragment is triangular in shape with the base of the triangle
adjacent to the fibula. Fracture fragment measures up to 0.7 cm AP
by 2.3 cm transverse at the plafond by 2 cm craniocaudal. The
fracture fragment demonstrates slight posterior and lateral
displacement.

Also seen is a fracture of the medial malleolus. The distal fragment
is inferiorly displaced approximately 0.6 cm and demonstrates slight
medial displacement.

The patient has an acute diaphyseal fracture of the distal fibula.
The fracture originates approximately 6.5 cm above the tip of the
lateral malleolus. The fracture is mildly comminuted with 3
fragments identified. There is slight fragment override and mild
lateral angulation of the distal fragment.

Also seen is an avulsion fracture off of the anterior aspect of the
lateral malleolus where a fracture fragment measuring approximately
0.6 cm in diameter is anteriorly displaced approximately 0.3 cm.

Ligaments

Suboptimally assessed by CT. Avulsion fracture off the anterior
aspect of the lateral malleolus is at the expected attachment site
of the anterior, inferior tibiofibular ligament. As visualized by CT
scan, ligamentous structures about the ankle are otherwise
unremarkable.

Muscles and Tendons

Appear intact.  No tendon entrapment.

Soft tissues

Soft tissue contusion and hematoma about the patient's fractures
noted.
IMPRESSION: Ankle fractures most consistent with Cathal [id number] injury. Although the
anterior inferior tibiofibular ligament is likely intact, there is
an avulsion fracture at its attachment site to the fibula.

## 2022-09-10 IMAGING — CR DG ANKLE COMPLETE 3+V*R*
3 series · 3 of 3 positions shown · non-contrast
Comparison: Foot radiographs dated 12/05/2017.

CLINICAL DATA: Right ankle pain after a fall.

EXAM:
RIGHT ANKLE - COMPLETE 3+ VIEW

[ankle ap]
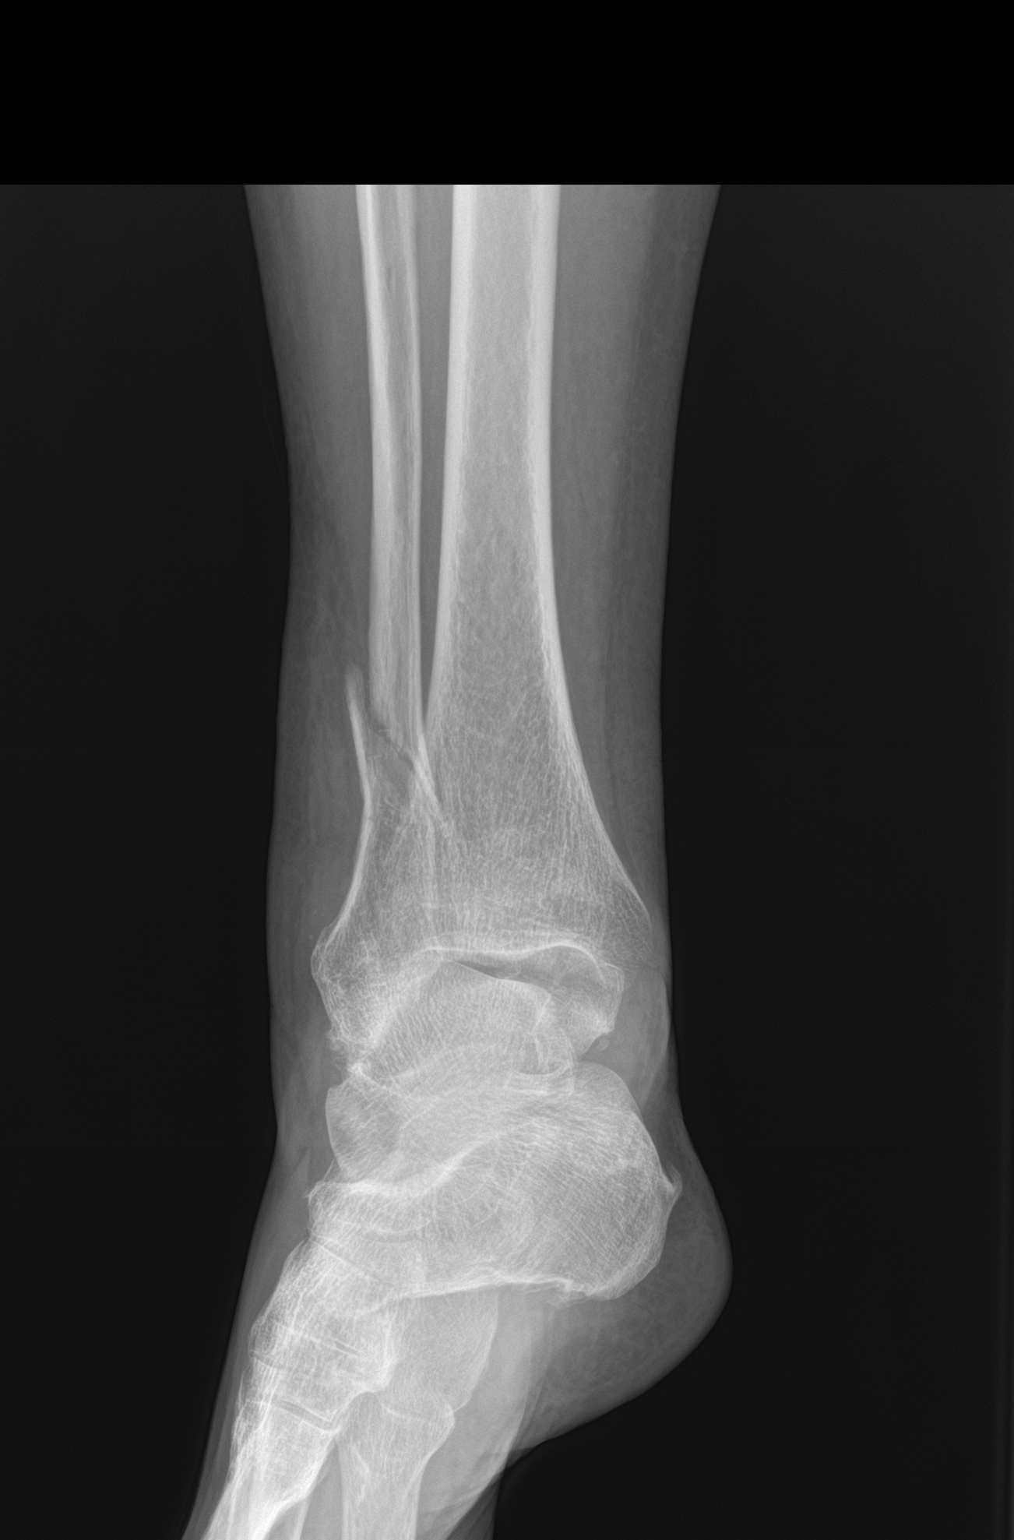

[ankle obl]
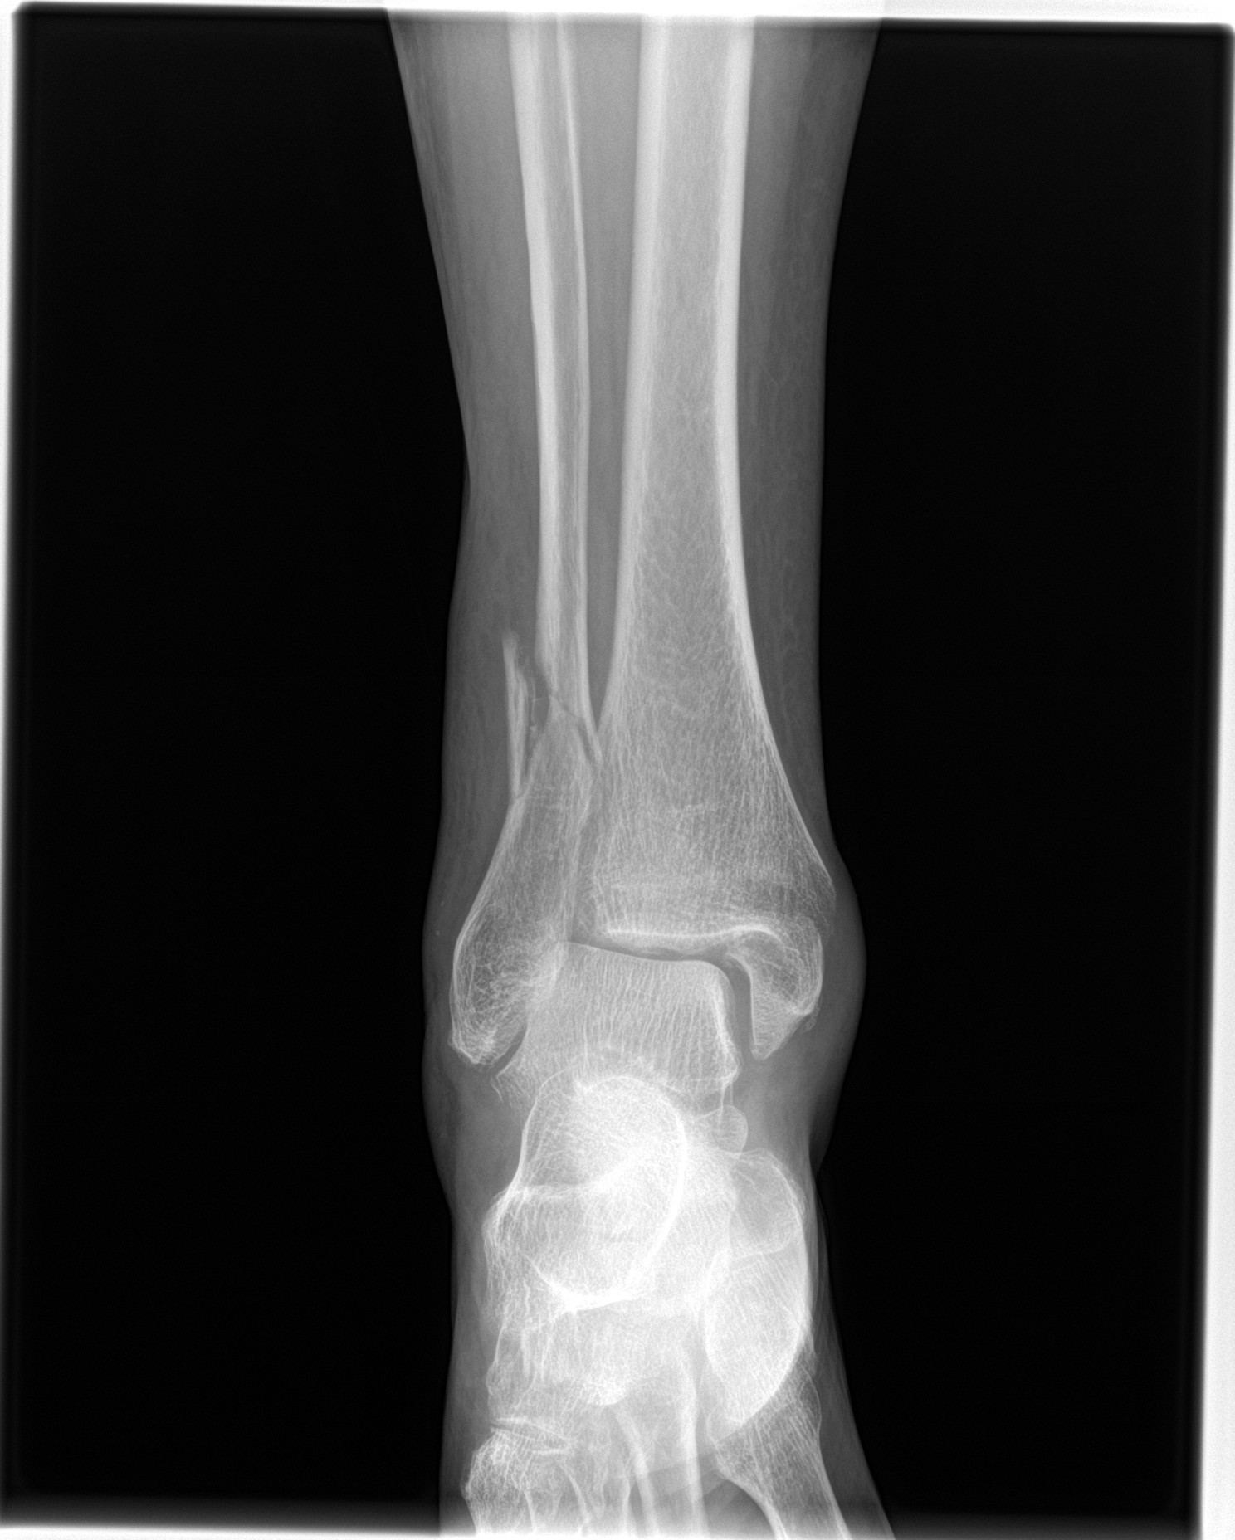

[ankle lat]
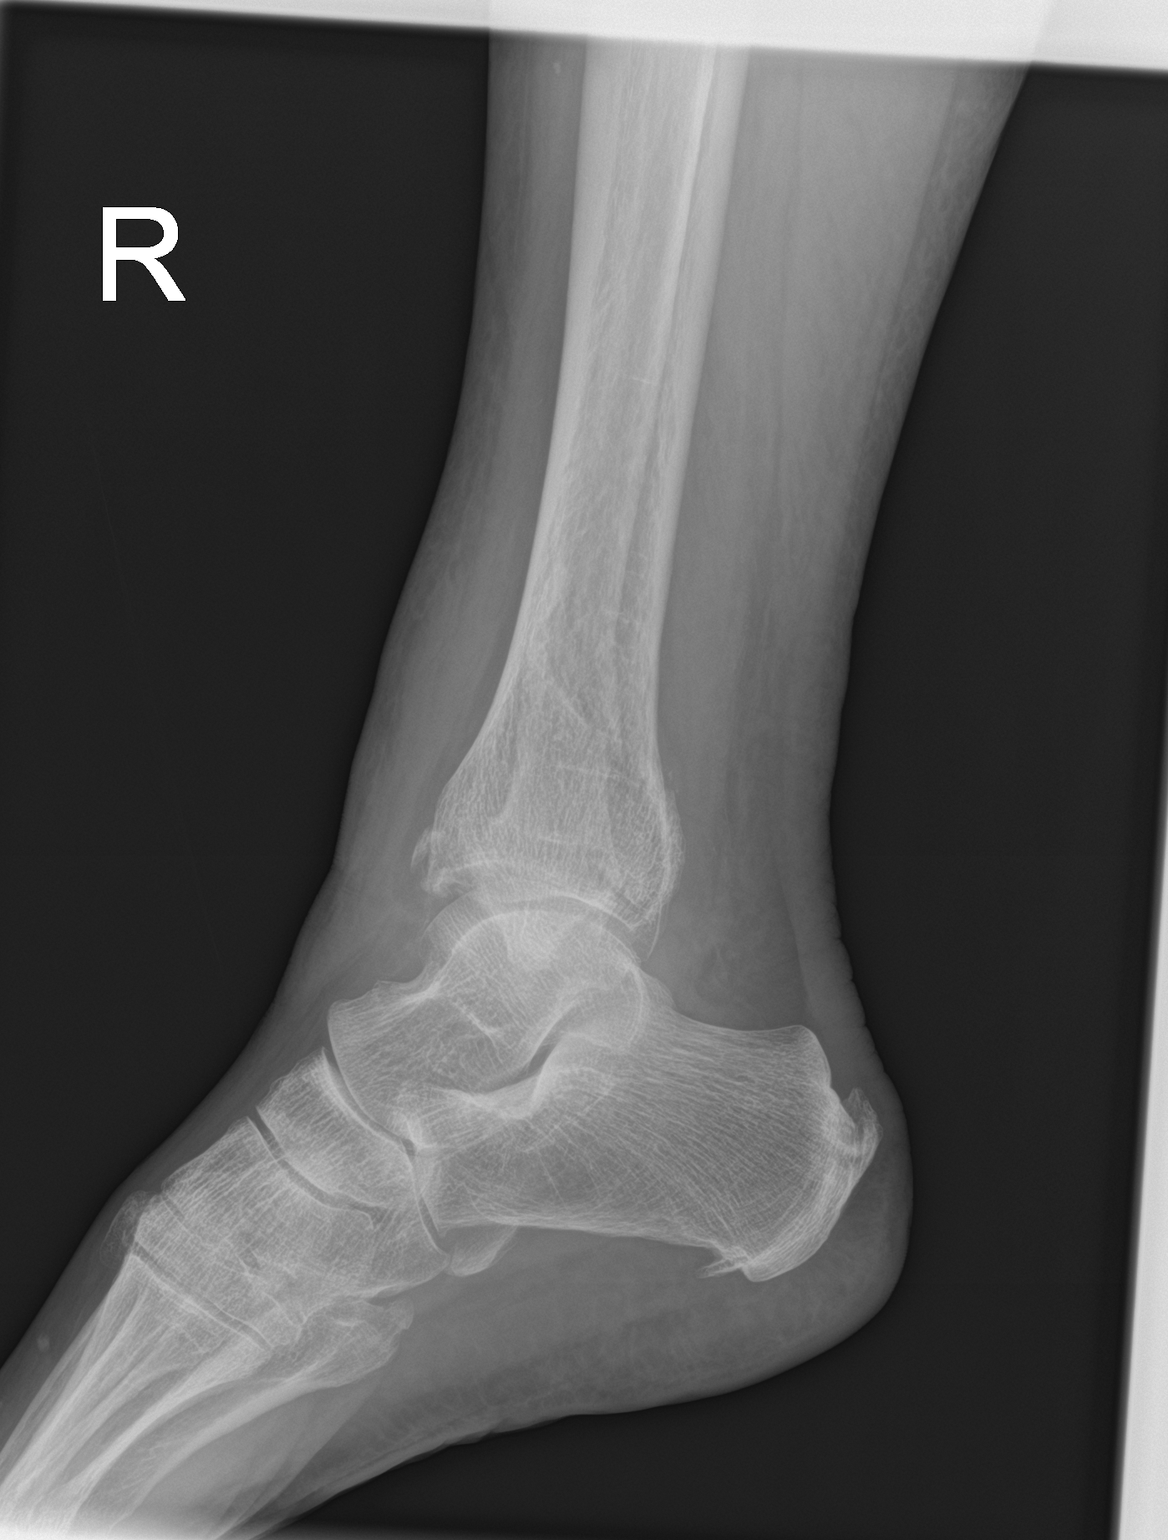

[3 of 3 positions shown; findings below may reference images not displayed]

FINDINGS: There is a comminuted fracture of the distal fibula at or above the
level of the tibiofibular syndesmosis. No definite widening of the
distal tibiofibular articulation. There is a mildly displaced
fracture of the medial malleolus with subluxation of the talar dome
laterally. A nondisplaced posterior malleolus fracture is difficult
to exclude. There is surrounding soft tissue swelling. Plantar and
posterior calcaneal enthesophytes are noted.
IMPRESSION: 1. Bimalleolar fracture with mild subluxation of the talar dome
laterally.
2. Nondisplaced posterior malleolus fracture is difficult to
exclude.

## 2022-09-10 IMAGING — CT CT HEAD W/O CM
4 series · 16 of 47 positions shown, 18 images · non-contrast
Comparison: None.

CLINICAL DATA: Pain following fall.  Syncope.

EXAM:
CT HEAD WITHOUT CONTRAST
TECHNIQUE: Contiguous axial images were obtained from the base of the skull
through the vertex without intravenous contrast.

[Series 3: head without · axial · non-contrast · 0.42mm/px · z∈[+1158,+1273]mm · 7 of 31 slices shown, 9 images]
[im 4/31  brain]
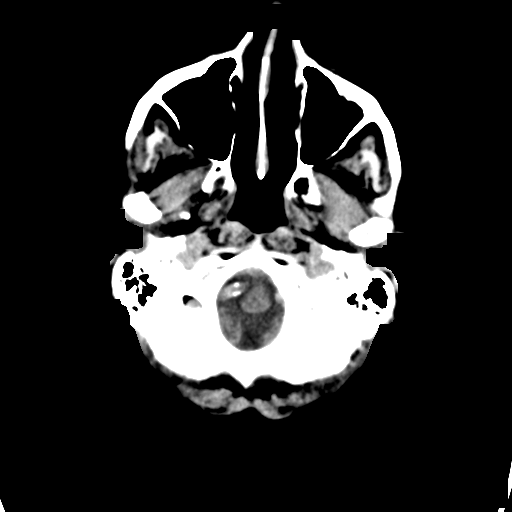
[im 4/31  bone]
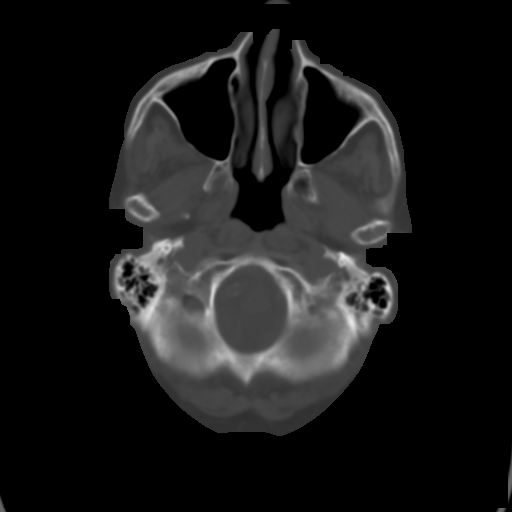
[im 8/31  brain]
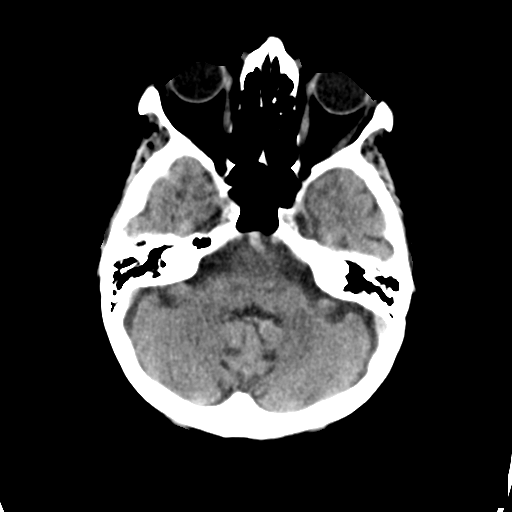
[im 12/31  brain]
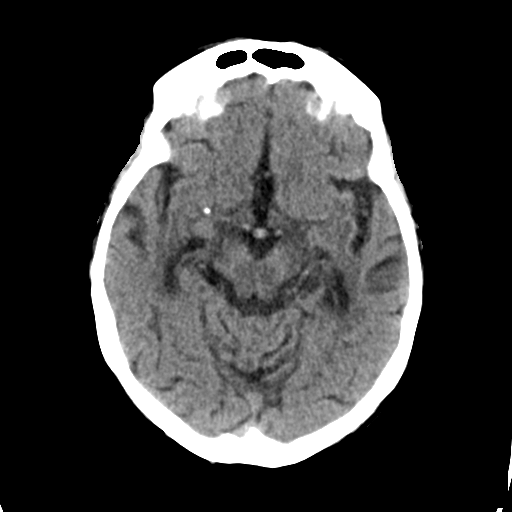
[im 16/31  brain]
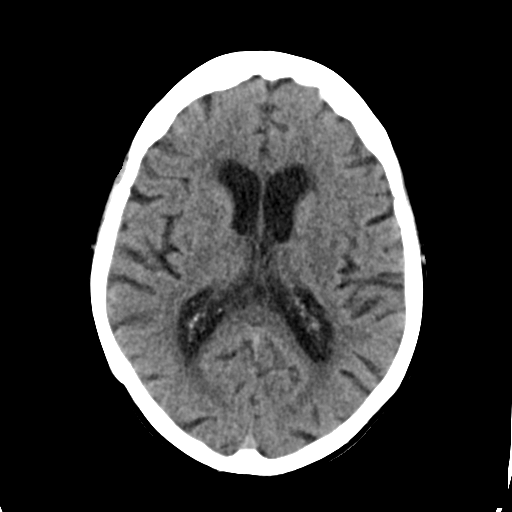
[im 19/31  brain]
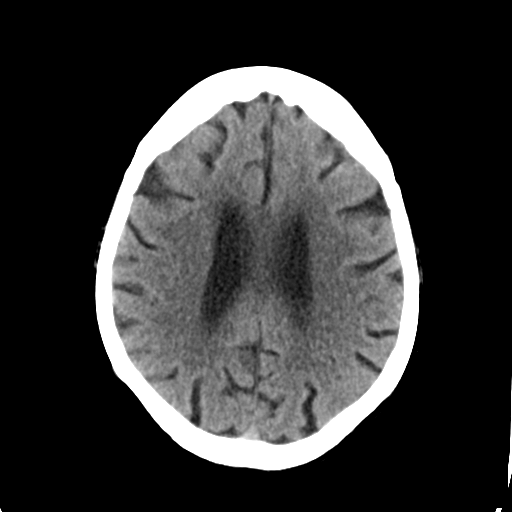
[im 19/31  bone]
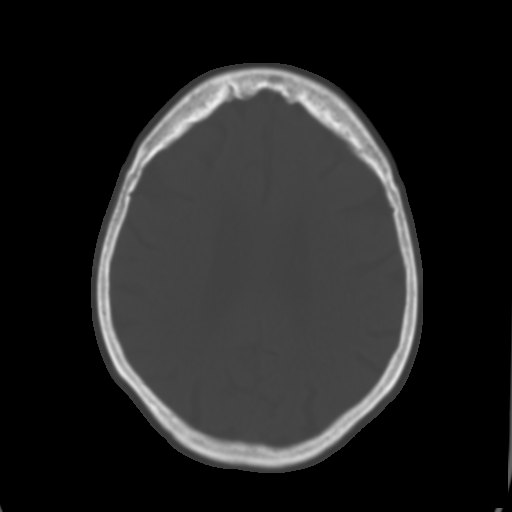
[im 23/31  brain]
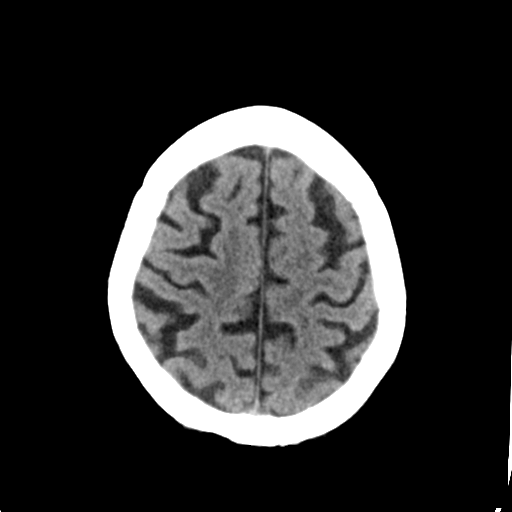
[im 27/31  brain]
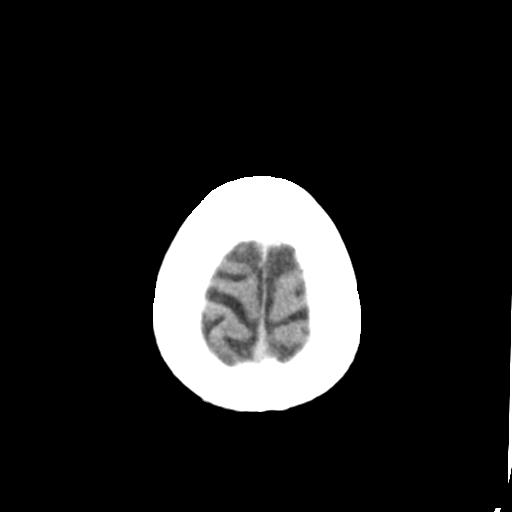

[Series 4: head bone · axial · 0.42mm/px · z∈[+1157,+1187]mm · 3 of 76 slices shown]
[im 8/76  bone]
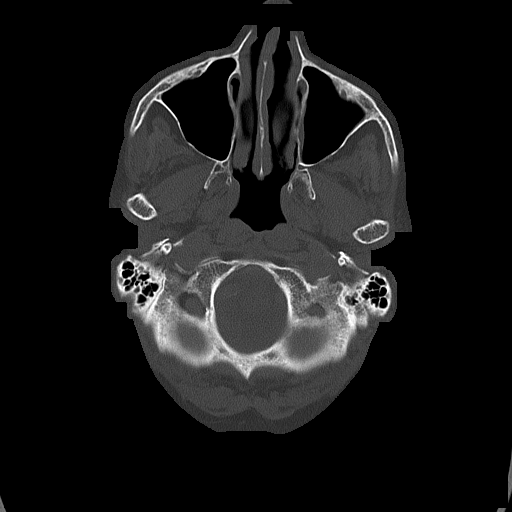
[im 16/76  bone]
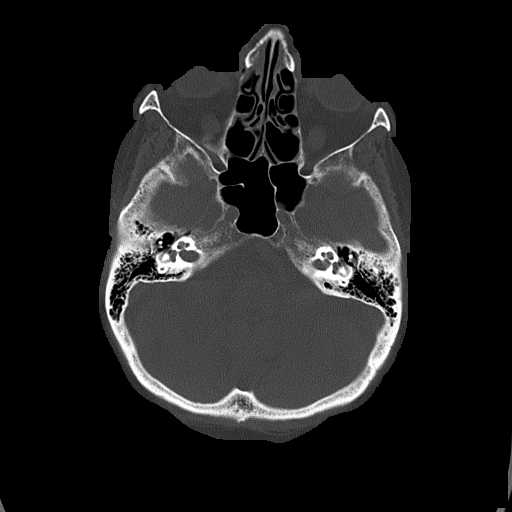
[im 23/76  bone]
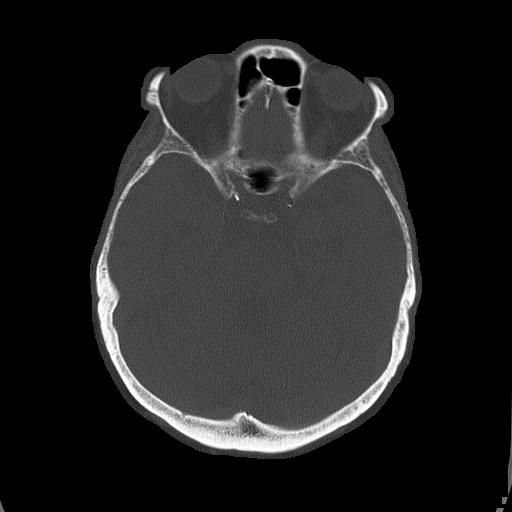

[Series 5: head without cor · coronal · non-contrast · 0.29mm/px · 3 of 67 slices shown]
[im 23/67  brain]
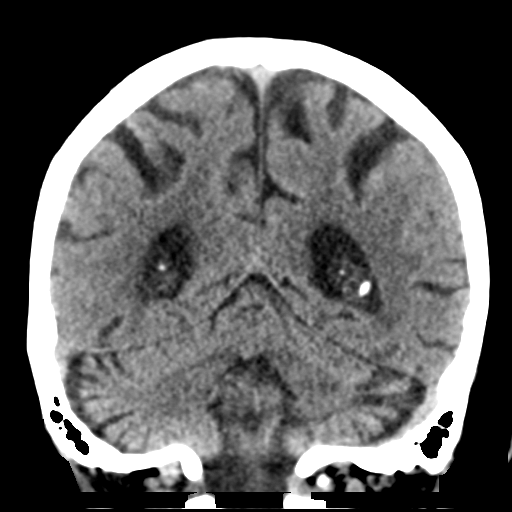
[im 30/67  brain]
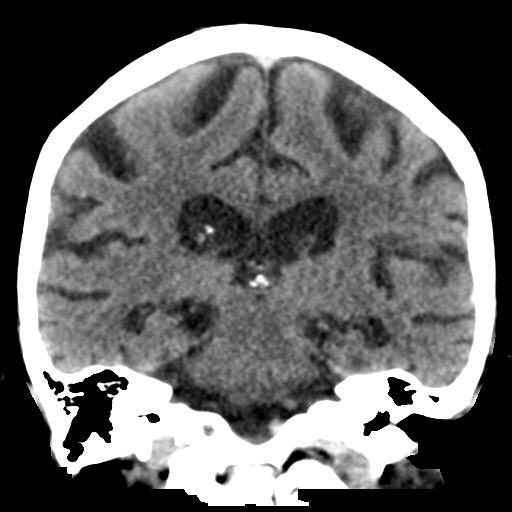
[im 37/67  brain]
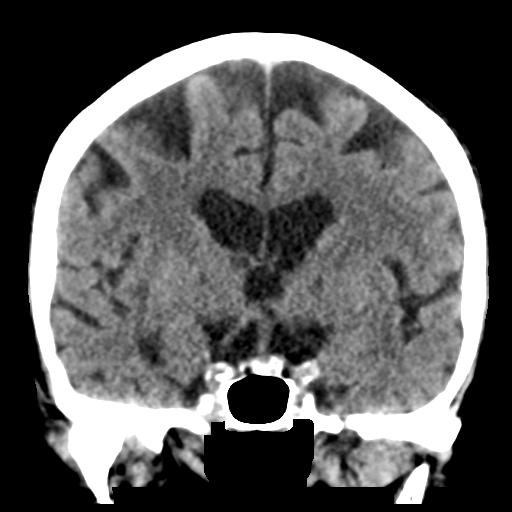

[Series 6: head without sag · sagittal · non-contrast · 0.29mm/px · 3 of 51 slices shown]
[im 17/51  brain]
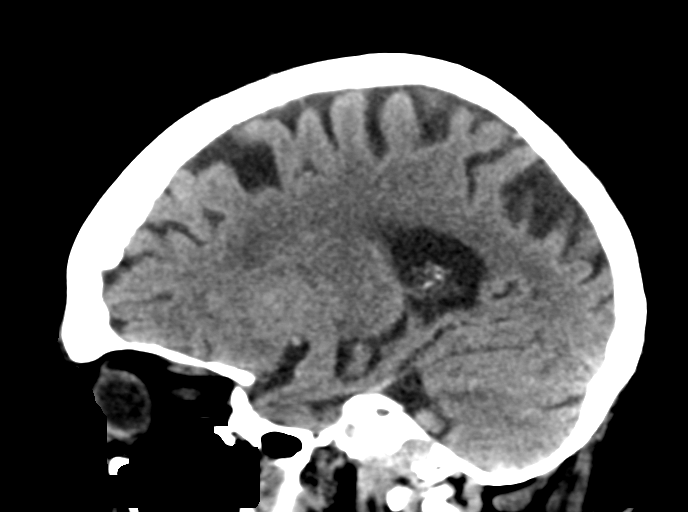
[im 26/51  brain]
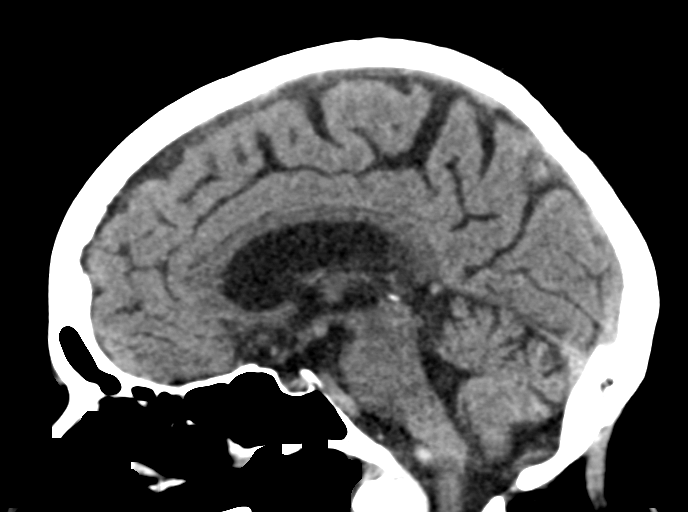
[im 34/51  brain]
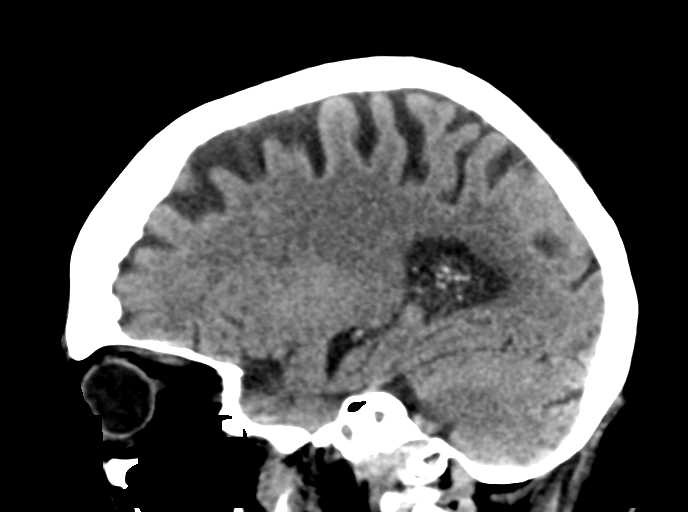

[16 of 47 positions shown; findings below may reference images not displayed]

FINDINGS: Brain: There is mild diffuse atrophy. There is no intracranial mass,
hemorrhage, extra-axial fluid collection, or midline shift. There is
mild small vessel disease in the centra semiovale bilaterally.
Elsewhere brain parenchyma appears unremarkable. No acute infarct
evident.

Vascular: No hyperdense vessel. There is calcification in each
carotid siphon region.

Skull: Bony calvarium appears intact.

Sinuses/Orbits: There is mucosal thickening in the left maxillary
antrum. There is mucosal thickening in several ethmoid air cells.
Orbits appear symmetric bilaterally.

Other: Mastoid air cells are clear.
IMPRESSION: Mild atrophy with mild periventricular small vessel disease. No
acute infarct. No mass or hemorrhage.

There are foci of arterial vascular calcification. There is mild
mucosal thickening in several paranasal sinus regions.

## 2022-09-14 IMAGING — RF DG C-ARM 1-60 MIN
1 series · 3 of 3 positions shown · non-contrast
Comparison: None.

CLINICAL DATA: ORIF surgery

EXAM:
RIGHT ANKLE - COMPLETE 3+ VIEW; DG C-ARM 1-60 MIN

[Series 1: unknown protocol · 0.14mm/px · 3 of 3 slices shown]
[im 1/3]
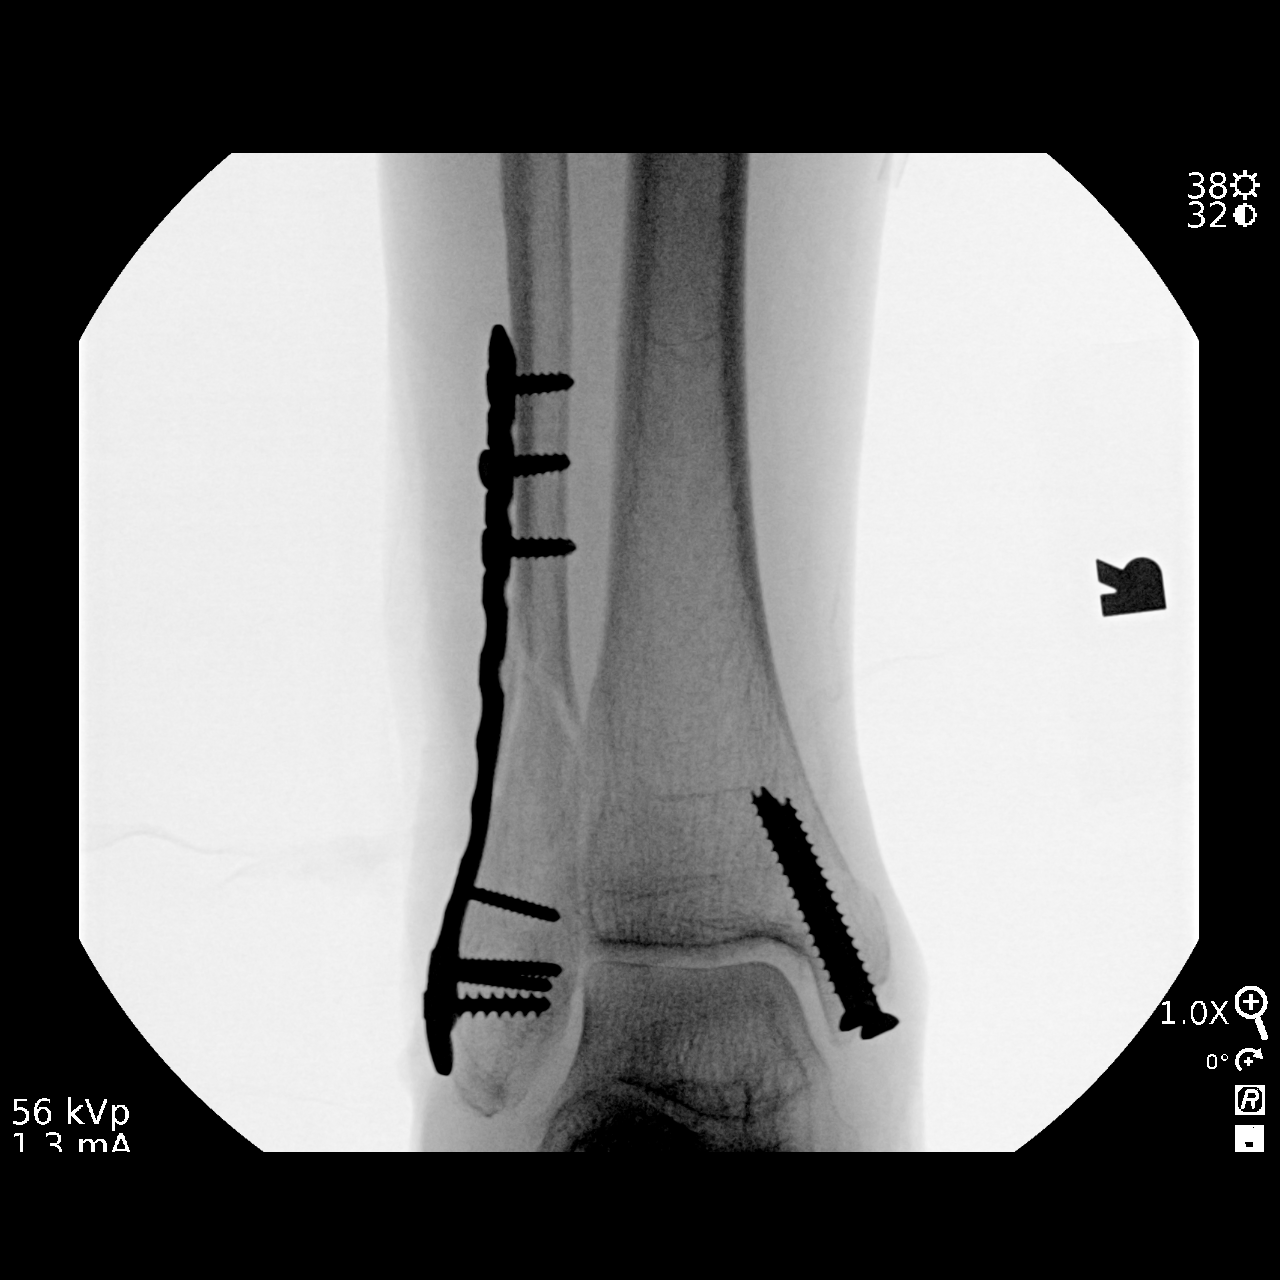
[im 2/3]
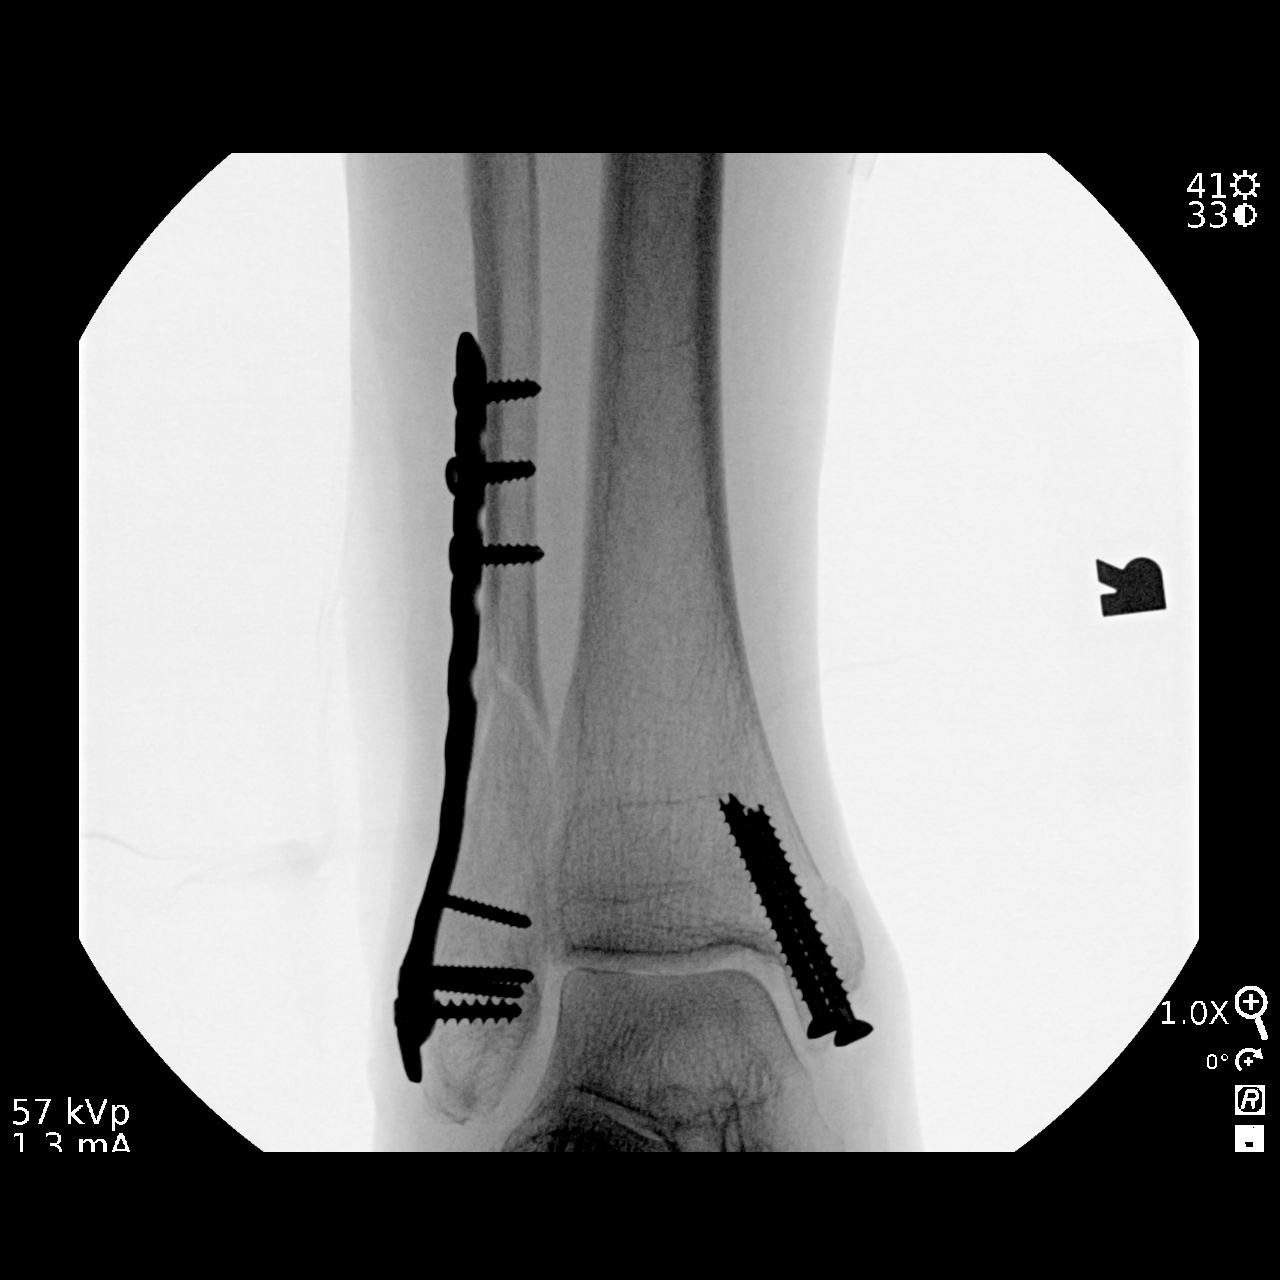
[im 3/3]
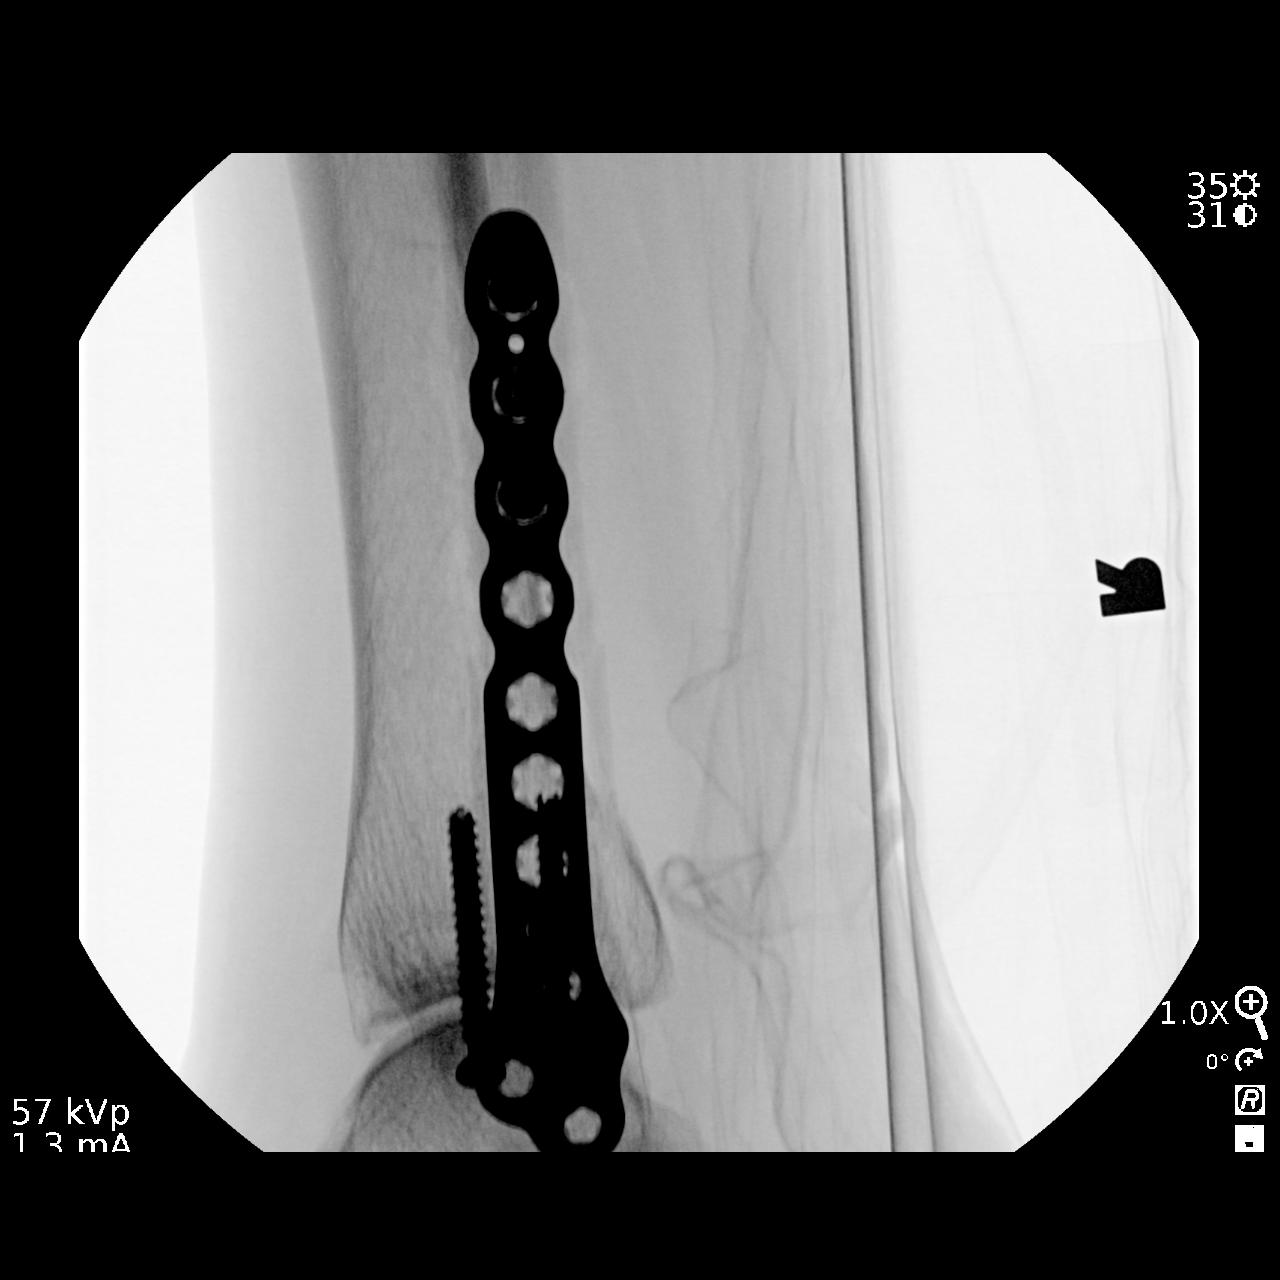

[3 of 3 positions shown; findings below may reference images not displayed]

FINDINGS: The patient is status post plate screw fixation of the distal fibula
and medial malleolus. Three intraop views were submitted. Fluoro
time 50 seconds
IMPRESSION: Intraop ORIF images ankle fixation.

## 2022-11-10 DIAGNOSIS — H353211 Exudative age-related macular degeneration, right eye, with active choroidal neovascularization: Secondary | ICD-10-CM | POA: Diagnosis not present

## 2022-11-10 DIAGNOSIS — H353222 Exudative age-related macular degeneration, left eye, with inactive choroidal neovascularization: Secondary | ICD-10-CM | POA: Diagnosis not present

## 2022-11-10 DIAGNOSIS — H43813 Vitreous degeneration, bilateral: Secondary | ICD-10-CM | POA: Diagnosis not present

## 2022-11-14 DIAGNOSIS — I10 Essential (primary) hypertension: Secondary | ICD-10-CM | POA: Diagnosis not present

## 2022-11-14 DIAGNOSIS — R350 Frequency of micturition: Secondary | ICD-10-CM | POA: Diagnosis not present

## 2022-11-16 ENCOUNTER — Encounter: Payer: Medicare Other | Admitting: Family Medicine

## 2022-11-23 ENCOUNTER — Encounter: Payer: Self-pay | Admitting: Family Medicine

## 2022-11-23 ENCOUNTER — Ambulatory Visit (INDEPENDENT_AMBULATORY_CARE_PROVIDER_SITE_OTHER): Payer: Medicare Other | Admitting: Family Medicine

## 2022-11-23 VITALS — BP 112/80 | HR 60 | Temp 97.9°F | Ht 63.0 in | Wt 144.8 lb

## 2022-11-23 DIAGNOSIS — E785 Hyperlipidemia, unspecified: Secondary | ICD-10-CM

## 2022-11-23 DIAGNOSIS — H3530 Unspecified macular degeneration: Secondary | ICD-10-CM | POA: Diagnosis not present

## 2022-11-23 DIAGNOSIS — R7301 Impaired fasting glucose: Secondary | ICD-10-CM

## 2022-11-23 DIAGNOSIS — E038 Other specified hypothyroidism: Secondary | ICD-10-CM

## 2022-11-23 DIAGNOSIS — M1990 Unspecified osteoarthritis, unspecified site: Secondary | ICD-10-CM

## 2022-11-23 DIAGNOSIS — M8589 Other specified disorders of bone density and structure, multiple sites: Secondary | ICD-10-CM | POA: Diagnosis not present

## 2022-11-23 DIAGNOSIS — I480 Paroxysmal atrial fibrillation: Secondary | ICD-10-CM | POA: Diagnosis not present

## 2022-11-23 DIAGNOSIS — I10 Essential (primary) hypertension: Secondary | ICD-10-CM

## 2022-11-23 DIAGNOSIS — R413 Other amnesia: Secondary | ICD-10-CM

## 2022-11-23 LAB — BASIC METABOLIC PANEL
BUN: 25 mg/dL — ABNORMAL HIGH (ref 6–23)
CO2: 24 mEq/L (ref 19–32)
Calcium: 9.5 mg/dL (ref 8.4–10.5)
Chloride: 104 mEq/L (ref 96–112)
Creatinine, Ser: 0.96 mg/dL (ref 0.40–1.20)
GFR: 52.98 mL/min — ABNORMAL LOW (ref >60.00)
Glucose, Bld: 131 mg/dL — ABNORMAL HIGH (ref 70–99)
Potassium: 4.5 mEq/L (ref 3.5–5.1)
Sodium: 139 mEq/L (ref 135–145)

## 2022-11-23 LAB — CBC WITH DIFFERENTIAL/PLATELET
Basophils Absolute: 0.1 10*3/uL (ref 0.0–0.1)
Basophils Relative: 0.8 % (ref 0.0–3.0)
Eosinophils Absolute: 0.2 10*3/uL (ref 0.0–0.7)
Eosinophils Relative: 2 % (ref 0.0–5.0)
HCT: 44.1 % (ref 36.0–46.0)
Hemoglobin: 14.4 g/dL (ref 12.0–15.0)
Lymphocytes Relative: 24.5 % (ref 12.0–46.0)
Lymphs Abs: 2.8 10*3/uL (ref 0.7–4.0)
MCHC: 32.6 g/dL (ref 30.0–36.0)
MCV: 92.1 fl (ref 78.0–100.0)
Monocytes Absolute: 1.1 10*3/uL — ABNORMAL HIGH (ref 0.1–1.0)
Monocytes Relative: 10.1 % (ref 3.0–12.0)
Neutro Abs: 7.1 10*3/uL (ref 1.4–7.7)
Neutrophils Relative %: 62.6 % (ref 43.0–77.0)
Platelets: 243 10*3/uL (ref 150.0–400.0)
RBC: 4.79 Mil/uL (ref 3.87–5.11)
RDW: 13.7 % (ref 11.5–15.5)
WBC: 11.4 10*3/uL — ABNORMAL HIGH (ref 4.0–10.5)

## 2022-11-23 LAB — LIPID PANEL
Cholesterol: 214 mg/dL — ABNORMAL HIGH (ref 0–200)
HDL: 43.2 mg/dL (ref >39.00)
LDL Cholesterol: 138 mg/dL — ABNORMAL HIGH (ref 0–99)
NonHDL: 171.15
Total CHOL/HDL Ratio: 5
Triglycerides: 164 mg/dL — ABNORMAL HIGH (ref 0.0–149.0)
VLDL: 32.8 mg/dL (ref 0.0–40.0)

## 2022-11-23 LAB — HEPATIC FUNCTION PANEL
ALT: 12 U/L (ref 0–35)
AST: 18 U/L (ref 0–37)
Albumin: 4 g/dL (ref 3.5–5.2)
Alkaline Phosphatase: 64 U/L (ref 39–117)
Bilirubin, Direct: 0.1 mg/dL (ref 0.0–0.3)
Total Bilirubin: 0.8 mg/dL (ref 0.2–1.2)
Total Protein: 7 g/dL (ref 6.0–8.3)

## 2022-11-23 LAB — HEMOGLOBIN A1C: Hgb A1c MFr Bld: 7.3 % — ABNORMAL HIGH (ref 4.6–6.5)

## 2022-11-23 LAB — TSH: TSH: 0.63 u[IU]/mL (ref 0.35–5.50)

## 2022-11-23 NOTE — Progress Notes (Signed)
Subjective:    Patient ID: Cynthia Mathis, female    DOB: [birth year], 89 y.o.   MRN: [id number]  HPI Here with her son to follow up on issues. She feels well and has no concerns. She divides her time throughout the year between staying in [place], staying with a daughter in [place], and staying with a daughter in [place]. While she was in [place], [place] last year she presented to the ED on 12-06-21 with slurred speech. A CT revealed that she had a small left frontal infarct. She was in sinus rhythm at that time. An ECHO showed an EF of 6-65% and was otherwise normal. She recovered quickly, and he r speech had returned to normal by the time she went home. She has had no neurologic issues since then. She was already taking Xarelto daily for her paroxysmal atrial fibrillation, so they added ASA 81 mg daily to that on her DC home. Her HTN has been stable. Her OA has been stable. She and her son agree that her memory has been stable.    Review of Systems  Constitutional: Negative.   HENT: Negative.    Eyes: Negative.   Respiratory: Negative.    Cardiovascular: Negative.   Gastrointestinal: Negative.   Genitourinary:  Negative for decreased urine volume, difficulty urinating, dyspareunia, dysuria, enuresis, flank pain, frequency, hematuria, pelvic pain and urgency.  Musculoskeletal: Negative.   Skin: Negative.   Neurological: Negative.  Negative for headaches.  Psychiatric/Behavioral: Negative.         Objective:   Physical Exam Constitutional:      General: She is not in acute distress.    Appearance: Normal appearance. She is well-developed.     Comments: She walks with a cane   HENT:     Head: Normocephalic and atraumatic.     Right Ear: External ear normal.     Left Ear: External ear normal.     Nose: Nose normal.     Mouth/Throat:     Pharynx: No oropharyngeal exudate.  Eyes:     General: No scleral icterus.    Conjunctiva/sclera: Conjunctivae normal.     Pupils:  Pupils are equal, round, and reactive to light.  Neck:     Thyroid: No thyromegaly.     Vascular: No JVD.  Cardiovascular:     Rate and Rhythm: Normal rate and regular rhythm.     Heart sounds: Normal heart sounds. No murmur heard.    No friction rub. No gallop.  Pulmonary:     Effort: Pulmonary effort is normal. No respiratory distress.     Breath sounds: Normal breath sounds. No wheezing or rales.  Chest:     Chest wall: No tenderness.  Abdominal:     General: Bowel sounds are normal. There is no distension.     Palpations: Abdomen is soft. There is no mass.     Tenderness: There is no abdominal tenderness. There is no guarding or rebound.  Musculoskeletal:        General: No tenderness. Normal range of motion.     Cervical back: Normal range of motion and neck supple.  Lymphadenopathy:     Cervical: No cervical adenopathy.  Skin:    General: Skin is warm and dry.     Findings: No erythema or rash.  Neurological:     General: No focal deficit present.     Mental Status: She is alert and oriented to person, place, and time.     Cranial Nerves: No cranial  nerve deficit.     Motor: No weakness or abnormal muscle tone.     Coordination: Coordination normal.     Gait: Gait normal.     Deep Tendon Reflexes: Reflexes are normal and symmetric. Reflexes normal.  Psychiatric:        Mood and Affect: Mood normal.        Behavior: Behavior normal.        Thought Content: Thought content normal.        Judgment: Judgment normal.           Assessment & Plan:  She is doing well in general. Her HTN is well controlled. Her OA and memory loss are stable. She has been in sinus rhythm for several years. She had a small stroke last year, but she qucikly recovered and has been okay since then. They are not sure when her last mammogram and DEXA were, so her son will try to found out. Get fasting labs to check lipids, A1c, etc. We spent a total of ( 35  ) minutes reviewing records and  discussing these issues.  Gershon Crane, MD

## 2022-11-28 ENCOUNTER — Ambulatory Visit: Payer: Medicare Other | Admitting: Family Medicine

## 2022-12-11 ENCOUNTER — Telehealth: Payer: Self-pay | Admitting: Family Medicine

## 2022-12-11 NOTE — Telephone Encounter (Signed)
Pt's son called to request a referral for Pt to have a mammogram.  LOV:  11/28/22  Please advise.

## 2022-12-13 NOTE — Telephone Encounter (Signed)
No referral is needed, they can make the appt

## 2022-12-14 ENCOUNTER — Ambulatory Visit (INDEPENDENT_AMBULATORY_CARE_PROVIDER_SITE_OTHER): Payer: Medicare Other | Admitting: Family Medicine

## 2022-12-14 DIAGNOSIS — Z91199 Patient's noncompliance with other medical treatment and regimen due to unspecified reason: Secondary | ICD-10-CM

## 2022-12-14 NOTE — Telephone Encounter (Signed)
Spoke with the patient's son and informed him of the message below.  °

## 2022-12-14 NOTE — Progress Notes (Signed)
Appt cancelled per pt.

## 2022-12-30 DIAGNOSIS — Z1231 Encounter for screening mammogram for malignant neoplasm of breast: Secondary | ICD-10-CM | POA: Diagnosis not present

## 2022-12-30 LAB — HM MAMMOGRAPHY

## 2023-01-02 ENCOUNTER — Encounter: Payer: Self-pay | Admitting: Family Medicine

## 2023-01-08 ENCOUNTER — Encounter: Payer: Self-pay | Admitting: Family Medicine

## 2023-01-12 NOTE — Telephone Encounter (Signed)
Yes she can stop the Anastrozol

## 2023-02-15 DIAGNOSIS — H353231 Exudative age-related macular degeneration, bilateral, with active choroidal neovascularization: Secondary | ICD-10-CM | POA: Diagnosis not present

## 2023-02-15 DIAGNOSIS — H3562 Retinal hemorrhage, left eye: Secondary | ICD-10-CM | POA: Diagnosis not present

## 2023-02-27 DIAGNOSIS — I4891 Unspecified atrial fibrillation: Secondary | ICD-10-CM | POA: Diagnosis not present

## 2023-02-27 DIAGNOSIS — H353290 Exudative age-related macular degeneration, unspecified eye, stage unspecified: Secondary | ICD-10-CM | POA: Diagnosis not present

## 2023-04-05 DIAGNOSIS — H43813 Vitreous degeneration, bilateral: Secondary | ICD-10-CM | POA: Diagnosis not present

## 2023-04-05 DIAGNOSIS — H3562 Retinal hemorrhage, left eye: Secondary | ICD-10-CM | POA: Diagnosis not present

## 2023-04-05 DIAGNOSIS — H353231 Exudative age-related macular degeneration, bilateral, with active choroidal neovascularization: Secondary | ICD-10-CM | POA: Diagnosis not present

## 2023-05-28 DIAGNOSIS — H353231 Exudative age-related macular degeneration, bilateral, with active choroidal neovascularization: Secondary | ICD-10-CM | POA: Diagnosis not present

## 2023-05-28 DIAGNOSIS — H3562 Retinal hemorrhage, left eye: Secondary | ICD-10-CM | POA: Diagnosis not present

## 2023-08-27 DIAGNOSIS — H43813 Vitreous degeneration, bilateral: Secondary | ICD-10-CM | POA: Diagnosis not present

## 2023-08-27 DIAGNOSIS — H3562 Retinal hemorrhage, left eye: Secondary | ICD-10-CM | POA: Diagnosis not present

## 2023-08-27 DIAGNOSIS — H353231 Exudative age-related macular degeneration, bilateral, with active choroidal neovascularization: Secondary | ICD-10-CM | POA: Diagnosis not present

## 2023-09-04 DIAGNOSIS — G301 Alzheimer's disease with late onset: Secondary | ICD-10-CM | POA: Diagnosis not present

## 2023-09-04 DIAGNOSIS — H353290 Exudative age-related macular degeneration, unspecified eye, stage unspecified: Secondary | ICD-10-CM | POA: Diagnosis not present

## 2023-09-24 DIAGNOSIS — H43813 Vitreous degeneration, bilateral: Secondary | ICD-10-CM | POA: Diagnosis not present

## 2023-09-24 DIAGNOSIS — H353231 Exudative age-related macular degeneration, bilateral, with active choroidal neovascularization: Secondary | ICD-10-CM | POA: Diagnosis not present

## 2023-09-24 DIAGNOSIS — H3562 Retinal hemorrhage, left eye: Secondary | ICD-10-CM | POA: Diagnosis not present

## 2024-03-31 NOTE — Progress Notes (Signed)
 Cynthia Mathis
